# Patient Record
Sex: Female | Born: 1950 | Race: White | Hispanic: No | Smoking: Former smoker
Health system: Southern US, Community
[De-identification: ages and names within clinical notes are randomized; demographics above are authoritative.]

## PROBLEM LIST (undated history)

## (undated) DIAGNOSIS — E039 Hypothyroidism, unspecified: Secondary | ICD-10-CM

## (undated) DIAGNOSIS — N189 Chronic kidney disease, unspecified: Secondary | ICD-10-CM

## (undated) DIAGNOSIS — E079 Disorder of thyroid, unspecified: Secondary | ICD-10-CM

## (undated) DIAGNOSIS — N281 Cyst of kidney, acquired: Secondary | ICD-10-CM

## (undated) DIAGNOSIS — L299 Pruritus, unspecified: Secondary | ICD-10-CM

## (undated) DIAGNOSIS — F32A Depression, unspecified: Secondary | ICD-10-CM

## (undated) DIAGNOSIS — G4730 Sleep apnea, unspecified: Secondary | ICD-10-CM

## (undated) DIAGNOSIS — E785 Hyperlipidemia, unspecified: Secondary | ICD-10-CM

## (undated) DIAGNOSIS — R011 Cardiac murmur, unspecified: Secondary | ICD-10-CM

## (undated) DIAGNOSIS — B1920 Unspecified viral hepatitis C without hepatic coma: Secondary | ICD-10-CM

## (undated) DIAGNOSIS — F319 Bipolar disorder, unspecified: Secondary | ICD-10-CM

## (undated) DIAGNOSIS — M4800 Spinal stenosis, site unspecified: Secondary | ICD-10-CM

## (undated) DIAGNOSIS — K219 Gastro-esophageal reflux disease without esophagitis: Secondary | ICD-10-CM

## (undated) DIAGNOSIS — I10 Essential (primary) hypertension: Secondary | ICD-10-CM

## (undated) DIAGNOSIS — C801 Malignant (primary) neoplasm, unspecified: Secondary | ICD-10-CM

## (undated) DIAGNOSIS — F329 Major depressive disorder, single episode, unspecified: Secondary | ICD-10-CM

## (undated) DIAGNOSIS — R17 Unspecified jaundice: Secondary | ICD-10-CM

## (undated) DIAGNOSIS — F419 Anxiety disorder, unspecified: Secondary | ICD-10-CM

## (undated) DIAGNOSIS — M1990 Unspecified osteoarthritis, unspecified site: Secondary | ICD-10-CM

## (undated) HISTORY — PX: ABDOMINAL HYSTERECTOMY: SHX81

## (undated) HISTORY — DX: Cyst of kidney, acquired: N28.1

## (undated) HISTORY — PX: KNEE ARTHROSCOPY: SUR90

## (undated) HISTORY — DX: Unspecified jaundice: R17

## (undated) HISTORY — DX: Major depressive disorder, single episode, unspecified: F32.9

## (undated) HISTORY — DX: Depression, unspecified: F32.A

## (undated) HISTORY — DX: Chronic kidney disease, unspecified: N18.9

## (undated) HISTORY — DX: Unspecified osteoarthritis, unspecified site: M19.90

## (undated) HISTORY — PX: PARTIAL HYSTERECTOMY: SHX80

## (undated) HISTORY — DX: Pruritus, unspecified: L29.9

## (undated) HISTORY — DX: Malignant (primary) neoplasm, unspecified: C80.1

## (undated) HISTORY — DX: Disorder of thyroid, unspecified: E07.9

## (undated) HISTORY — DX: Unspecified viral hepatitis C without hepatic coma: B19.20

## (undated) HISTORY — PX: TUBAL LIGATION: SHX77

## (undated) HISTORY — DX: Spinal stenosis, site unspecified: M48.00

## (undated) HISTORY — PX: APPENDECTOMY: SHX54

## (undated) HISTORY — PX: COLONOSCOPY W/ POLYPECTOMY: SHX1380

## (undated) HISTORY — PX: JOINT REPLACEMENT: SHX530

## (undated) HISTORY — DX: Cardiac murmur, unspecified: R01.1

---

## 1999-03-22 ENCOUNTER — Ambulatory Visit (HOSPITAL_BASED_OUTPATIENT_CLINIC_OR_DEPARTMENT_OTHER): Admission: RE | Admit: 1999-03-22 | Discharge: 1999-03-22 | Payer: Self-pay | Admitting: Orthopedic Surgery

## 2002-06-10 ENCOUNTER — Inpatient Hospital Stay (HOSPITAL_COMMUNITY): Admission: EM | Admit: 2002-06-10 | Discharge: 2002-06-14 | Payer: Self-pay | Admitting: Psychiatry

## 2002-06-17 ENCOUNTER — Other Ambulatory Visit (HOSPITAL_COMMUNITY): Admission: RE | Admit: 2002-06-17 | Discharge: 2002-06-25 | Payer: Self-pay | Admitting: *Deleted

## 2002-08-14 ENCOUNTER — Inpatient Hospital Stay (HOSPITAL_COMMUNITY): Admission: EM | Admit: 2002-08-14 | Discharge: 2002-08-18 | Payer: Self-pay | Admitting: Psychiatry

## 2002-08-19 ENCOUNTER — Other Ambulatory Visit (HOSPITAL_COMMUNITY): Admission: RE | Admit: 2002-08-19 | Discharge: 2002-09-02 | Payer: Self-pay | Admitting: Psychiatry

## 2004-03-18 ENCOUNTER — Ambulatory Visit (HOSPITAL_COMMUNITY): Admission: RE | Admit: 2004-03-18 | Discharge: 2004-03-18 | Payer: Self-pay | Admitting: Internal Medicine

## 2004-03-22 ENCOUNTER — Encounter: Admission: RE | Admit: 2004-03-22 | Discharge: 2004-03-22 | Payer: Self-pay | Admitting: Internal Medicine

## 2004-04-19 ENCOUNTER — Encounter: Admission: RE | Admit: 2004-04-19 | Discharge: 2004-04-19 | Payer: Self-pay | Admitting: Internal Medicine

## 2004-07-19 ENCOUNTER — Ambulatory Visit: Payer: Self-pay | Admitting: Internal Medicine

## 2004-07-19 ENCOUNTER — Ambulatory Visit (HOSPITAL_COMMUNITY): Admission: RE | Admit: 2004-07-19 | Discharge: 2004-07-19 | Payer: Self-pay | Admitting: Internal Medicine

## 2005-02-23 ENCOUNTER — Ambulatory Visit: Payer: Self-pay | Admitting: Internal Medicine

## 2005-02-23 ENCOUNTER — Ambulatory Visit: Payer: Self-pay | Admitting: *Deleted

## 2005-03-03 ENCOUNTER — Ambulatory Visit (HOSPITAL_COMMUNITY): Admission: RE | Admit: 2005-03-03 | Discharge: 2005-03-03 | Payer: Self-pay | Admitting: Family Medicine

## 2005-08-12 ENCOUNTER — Ambulatory Visit: Payer: Self-pay | Admitting: Internal Medicine

## 2005-08-23 ENCOUNTER — Ambulatory Visit: Payer: Self-pay | Admitting: Internal Medicine

## 2006-03-03 ENCOUNTER — Ambulatory Visit: Payer: Self-pay | Admitting: Family Medicine

## 2006-03-06 ENCOUNTER — Ambulatory Visit (HOSPITAL_COMMUNITY): Admission: RE | Admit: 2006-03-06 | Discharge: 2006-03-06 | Payer: Self-pay | Admitting: Family Medicine

## 2006-04-24 ENCOUNTER — Ambulatory Visit: Payer: Self-pay | Admitting: Family Medicine

## 2006-05-30 ENCOUNTER — Ambulatory Visit: Payer: Self-pay | Admitting: Family Medicine

## 2006-06-12 ENCOUNTER — Ambulatory Visit: Payer: Self-pay | Admitting: Family Medicine

## 2006-07-25 ENCOUNTER — Encounter (INDEPENDENT_AMBULATORY_CARE_PROVIDER_SITE_OTHER): Payer: Self-pay | Admitting: Family Medicine

## 2006-07-25 ENCOUNTER — Ambulatory Visit: Payer: Self-pay | Admitting: Family Medicine

## 2006-08-07 ENCOUNTER — Inpatient Hospital Stay (HOSPITAL_COMMUNITY): Admission: RE | Admit: 2006-08-07 | Discharge: 2006-08-10 | Payer: Self-pay | Admitting: Orthopedic Surgery

## 2006-10-27 ENCOUNTER — Ambulatory Visit: Payer: Self-pay | Admitting: Family Medicine

## 2006-11-20 ENCOUNTER — Inpatient Hospital Stay (HOSPITAL_COMMUNITY): Admission: RE | Admit: 2006-11-20 | Discharge: 2006-11-22 | Payer: Self-pay | Admitting: Orthopedic Surgery

## 2006-12-12 ENCOUNTER — Ambulatory Visit: Payer: Self-pay | Admitting: Family Medicine

## 2008-01-14 ENCOUNTER — Encounter: Admission: RE | Admit: 2008-01-14 | Discharge: 2008-01-14 | Payer: Self-pay | Admitting: Internal Medicine

## 2008-03-03 ENCOUNTER — Encounter: Admission: RE | Admit: 2008-03-03 | Discharge: 2008-03-03 | Payer: Self-pay | Admitting: Neurology

## 2008-03-06 ENCOUNTER — Encounter: Admission: RE | Admit: 2008-03-06 | Discharge: 2008-05-29 | Payer: Self-pay | Admitting: Psychology

## 2009-09-28 ENCOUNTER — Inpatient Hospital Stay (HOSPITAL_COMMUNITY): Admission: RE | Admit: 2009-09-28 | Discharge: 2009-10-05 | Payer: Self-pay | Admitting: Psychiatry

## 2009-09-28 ENCOUNTER — Ambulatory Visit: Payer: Self-pay | Admitting: Psychiatry

## 2009-09-28 ENCOUNTER — Emergency Department (HOSPITAL_COMMUNITY): Admission: EM | Admit: 2009-09-28 | Discharge: 2009-09-28 | Payer: Self-pay | Admitting: Emergency Medicine

## 2010-11-21 LAB — URINALYSIS, ROUTINE W REFLEX MICROSCOPIC
Bilirubin Urine: NEGATIVE
Glucose, UA: NEGATIVE mg/dL
Hgb urine dipstick: NEGATIVE
Ketones, ur: NEGATIVE mg/dL
Nitrite: NEGATIVE
Protein, ur: NEGATIVE mg/dL
Specific Gravity, Urine: 1.009 (ref 1.005–1.030)
Urobilinogen, UA: 0.2 mg/dL (ref 0.0–1.0)
pH: 6.5 (ref 5.0–8.0)

## 2010-11-21 LAB — TSH: TSH: 2.304 u[IU]/mL (ref 0.350–4.500)

## 2010-11-21 LAB — LITHIUM LEVEL: Lithium Lvl: 0.43 mEq/L — ABNORMAL LOW (ref 0.80–1.40)

## 2010-11-22 LAB — RAPID URINE DRUG SCREEN, HOSP PERFORMED
Amphetamines: NOT DETECTED
Barbiturates: NOT DETECTED
Benzodiazepines: POSITIVE — AB
Cocaine: NOT DETECTED
Opiates: NOT DETECTED
Tetrahydrocannabinol: NOT DETECTED

## 2010-11-22 LAB — CBC
HCT: 37 % (ref 36.0–46.0)
Hemoglobin: 12.4 g/dL (ref 12.0–15.0)
MCHC: 33.7 g/dL (ref 30.0–36.0)
MCV: 92.3 fL (ref 78.0–100.0)
Platelets: 226 10*3/uL (ref 150–400)
RBC: 4.01 MIL/uL (ref 3.87–5.11)
RDW: 12.8 % (ref 11.5–15.5)
WBC: 6.8 10*3/uL (ref 4.0–10.5)

## 2010-11-22 LAB — BASIC METABOLIC PANEL
BUN: 15 mg/dL (ref 6–23)
CO2: 28 mEq/L (ref 19–32)
Calcium: 8.7 mg/dL (ref 8.4–10.5)
Chloride: 106 mEq/L (ref 96–112)
Creatinine, Ser: 0.97 mg/dL (ref 0.4–1.2)
GFR calc Af Amer: >60 mL/min (ref >60)
GFR calc non Af Amer: 59 mL/min — ABNORMAL LOW (ref >60)
Glucose, Bld: 97 mg/dL (ref 70–99)
Potassium: 4.7 mEq/L (ref 3.5–5.1)
Sodium: 140 mEq/L (ref 135–145)

## 2010-11-22 LAB — ETHANOL: Alcohol, Ethyl (B): 5 mg/dL (ref 0–10)

## 2010-12-10 ENCOUNTER — Emergency Department (HOSPITAL_COMMUNITY)
Admission: EM | Admit: 2010-12-10 | Discharge: 2010-12-11 | Disposition: A | Discharge status: Other Acute Inpatient Hospital | Payer: Medicare Other | Attending: Emergency Medicine | Admitting: Emergency Medicine

## 2010-12-10 DIAGNOSIS — Z79899 Other long term (current) drug therapy: Secondary | ICD-10-CM | POA: Insufficient documentation

## 2010-12-10 DIAGNOSIS — F329 Major depressive disorder, single episode, unspecified: Secondary | ICD-10-CM | POA: Insufficient documentation

## 2010-12-10 DIAGNOSIS — F3289 Other specified depressive episodes: Secondary | ICD-10-CM | POA: Insufficient documentation

## 2010-12-10 DIAGNOSIS — T50904A Poisoning by unspecified drugs, medicaments and biological substances, undetermined, initial encounter: Secondary | ICD-10-CM | POA: Insufficient documentation

## 2010-12-10 DIAGNOSIS — T50901A Poisoning by unspecified drugs, medicaments and biological substances, accidental (unintentional), initial encounter: Secondary | ICD-10-CM | POA: Insufficient documentation

## 2010-12-10 DIAGNOSIS — I10 Essential (primary) hypertension: Secondary | ICD-10-CM | POA: Insufficient documentation

## 2010-12-10 LAB — DIFFERENTIAL
Basophils Absolute: 0 10*3/uL (ref 0.0–0.1)
Eosinophils Absolute: 0.1 10*3/uL (ref 0.0–0.7)
Lymphocytes Relative: 28 % (ref 12–46)
Lymphs Abs: 2.6 10*3/uL (ref 0.7–4.0)
Monocytes Relative: 10 % (ref 3–12)
Neutro Abs: 5.7 10*3/uL (ref 1.7–7.7)
Neutrophils Relative %: 61 % (ref 43–77)

## 2010-12-10 LAB — COMPREHENSIVE METABOLIC PANEL
ALT: 29 U/L (ref 0–35)
AST: 29 U/L (ref 0–37)
Albumin: 4.3 g/dL (ref 3.5–5.2)
Alkaline Phosphatase: 68 U/L (ref 39–117)
BUN: 10 mg/dL (ref 6–23)
CO2: 27 mEq/L (ref 19–32)
Calcium: 10.2 mg/dL (ref 8.4–10.5)
Chloride: 99 mEq/L (ref 96–112)
Creatinine, Ser: 0.86 mg/dL (ref 0.4–1.2)
GFR calc non Af Amer: >60 mL/min (ref >60)
Glucose, Bld: 118 mg/dL — ABNORMAL HIGH (ref 70–99)
Potassium: 4 mEq/L (ref 3.5–5.1)
Sodium: 136 mEq/L (ref 135–145)
Total Bilirubin: 0.9 mg/dL (ref 0.3–1.2)

## 2010-12-10 LAB — CBC
HCT: 45.1 % (ref 36.0–46.0)
Hemoglobin: 14.9 g/dL (ref 12.0–15.0)
MCH: 31.1 pg (ref 26.0–34.0)
MCHC: 33 g/dL (ref 30.0–36.0)
MCV: 94.2 fL (ref 78.0–100.0)
RBC: 4.79 MIL/uL (ref 3.87–5.11)
RDW: 12.3 % (ref 11.5–15.5)
WBC: 9.2 10*3/uL (ref 4.0–10.5)

## 2010-12-10 LAB — ETHANOL: Alcohol, Ethyl (B): <5 mg/dL (ref 0–10)

## 2010-12-10 LAB — RAPID URINE DRUG SCREEN, HOSP PERFORMED
Amphetamines: POSITIVE — AB
Cocaine: NOT DETECTED
Opiates: NOT DETECTED
Tetrahydrocannabinol: NOT DETECTED

## 2010-12-11 ENCOUNTER — Inpatient Hospital Stay (HOSPITAL_COMMUNITY)
Admission: EM | Admit: 2010-12-11 | Discharge: 2010-12-14 | DRG: 885 | Disposition: A | Discharge status: Home / Self Care | Payer: 59 | Source: Intra-hospital | Attending: Psychiatry | Admitting: Psychiatry

## 2010-12-11 DIAGNOSIS — T43502A Poisoning by unspecified antipsychotics and neuroleptics, intentional self-harm, initial encounter: Secondary | ICD-10-CM

## 2010-12-11 DIAGNOSIS — T424X4A Poisoning by benzodiazepines, undetermined, initial encounter: Secondary | ICD-10-CM

## 2010-12-11 DIAGNOSIS — E785 Hyperlipidemia, unspecified: Secondary | ICD-10-CM

## 2010-12-11 DIAGNOSIS — F319 Bipolar disorder, unspecified: Secondary | ICD-10-CM

## 2010-12-11 DIAGNOSIS — I10 Essential (primary) hypertension: Secondary | ICD-10-CM

## 2010-12-14 LAB — FOLATE: Folate: 14.2 ng/mL

## 2010-12-14 LAB — VITAMIN B12: Vitamin B-12: 480 pg/mL (ref 211–911)

## 2010-12-14 LAB — TSH: TSH: 1.328 u[IU]/mL (ref 0.350–4.500)

## 2010-12-16 NOTE — H&P (Signed)
  NAME:  Becky Lawson, MENTH                  ACCOUNT NO.:  [id number]  MEDICAL RECORD NO.:  [id number]           PATIENT TYPE:  I  LOCATION:  0505                          [facility]ACILITY:  [facility]  PHYSICIAN:  Barbra Sarks, DO             DATE OF BIRTH:  DATE OF ADMISSION:  12/11/2010 DATE OF DISCHARGE:                      PSYCHIATRIC ADMISSION ASSESSMENT   This is a 60 year old female voluntarily admitted on 12/11/2010.  HISTORY OF PRESENT ILLNESS:  The patient is here after an overdose on Valium taking well 12-1/2 Valium tablets after she had a breakup in a relationship that ended suddenly. She states that she was not ready for it to end. She states her medications were working.  She states that she was having depressive symptoms and could not stop hollering and she was hoping that the pain would go away at the time of the overdose. She was afraid she did not want to be alone.  PAST PSYCHIATRIC HISTORY:  The patient has been here prior.  She sees Dr. Polo Riley. She was last here in June 2011.  SOCIAL HISTORY:  The patient lives alone.  She is on disability.  She is single.  FAMILY HISTORY:  None.  X-RAYS:  She denies any alcohol or substance use.  PRIMARY CARE PROVIDER:  Unknown.  PAST MEDICAL HISTORY:  History of hypertension and cholesterol.  MEDICATIONS: 1. Cymbalta 20 mg b.i.d. 2. Abilify 10 mg q.h.s. 3. Ritalin 10 mg 1/2 tablet tab b.i.d. 4. Lovastatin 10 mg daily 5. Naprosyn 200 mg one q.6 h p.r.n. for pain 6. Ranitidine 150 mg q.h.s.  DRUG ALLERGIES:  No known allergies.  PHYSICAL EXAMINATION:  This is a middle-aged female assessed in the emergency department.  She appears in no distress.  She offers no complaints.  Her physical exam was reviewed.  Appeared well hydrated. Her CBC is within normal limits.  CMET within normal limits.  Urine drug screen is positive for benzodiazepines, positive for amphetamines.  MENTAL STATUS EXAM:  She is fully alert and  cooperative with good eye contact, casually dressed.  Speech is clear, normal pace and tone.  Mood is depressed.  She got tearful when talking about being alone.  Thought processes are coherent, goal directed.  Denies any suicidal or homicidal thoughts, cognitive function intact.  Her memory appears intact. Judgment and insight are fair. AXIS I:  History bipolar disorder. AXIS II:  Deferred. AXIS III:  Hypertension, hyperlipidemia. AXIS IV:  Other psychosocial problems. AXIS V:  Current is 35.  PLAN:  Plan is to continue her  medications at this time.  Will address her Valium use.  Continue to assess other comorbidities and support. The patient may benefit from some individual therapy if not already in process.  Her tentative length of stay is 2-3 days.     Redgie Grayer, N.P.   ______________________________ Barbra Sarks, DO    JO/MEDQ  D:  12/14/2010  T:  12/14/2010  Job:  [id number]  Electronically Signed by Arnetha CourserP. on 12/16/2010 09:19:23 AM Electronically Signed by Barbra Sarks MD on 12/16/2010 09:31:25 PM

## 2010-12-31 NOTE — Discharge Summary (Signed)
NAME:  Becky Lawson, Becky Lawson                  ACCOUNT NO.:  [id number]  MEDICAL RECORD NO.:  [id number]           PATIENT TYPE:  I  LOCATION:  0505                          [facility]ILITY[facility]  PHYSICIAN:  Marlou Sa, MD DATE OF BIRTH:  01-04-1951  DATE OF ADMISSION:  12/11/2010 DATE OF DISCHARGE:  12/14/2010                              DISCHARGE SUMMARY   REASON FOR ADMISSION/HOSPITAL COURSE:  This is a 60 year old female who was here after an overdose of Valium taking 12, 0.5 mg Valium tablets after a breakup in a relationship that ended suddenly.  She was reporting that her medications were not working, having some issues of feeling lonely after a relationship that ended.  FINAL DIAGNOSES:  AXIS I:  History of bipolar disorder. AXIS II:  Deferred. AXIS III:  Hypertension, hyperlipidemia. AXIS IV:  Other psychosocial problems. AXIS V:  Current is 50-55.  SIGNIFICANT LABS:  CBC was within normal limits.  CMET was within normal limits.  Urine drug screen was positive for benzodiazepines and positive for amphetamines.  SIGNIFICANT FINDINGS:  This was a fully alert and cooperative female with good eye contact, casually dressed.  Speech was clear, with normal pace and tone.  She was depressed and got tearful.  Her thought processes were coherent and goal directed.  Her memory was intact.  She was admitted to the adult milieu and we had a discussion about her Valium use.  She can continue with her other medications.  She was attending groups and  was participating in aftercare planning groups. She stated when she took the Valium, that she just wanted to sleep and again endorsed the feeling of loneliness after a breakup with the relationship.  We continued with her Cymbalta and increased her Abilify to 15 mg and obtained further labs.  The patient had stated that she was ready for discharge, was feeling much better and she stated that she had a lot of tools to help her out at home.  She  denied any suicidal or homicidal thoughts or psychotic symptoms.  We provided suicide prevention.  The patient had a deterrent for harming herself or her children and grandchildren.  She stated she had no desire to die and stated that her overdose was accidental.  We felt the patient was stable for discharge.  DISCHARGE MEDICATIONS: 1. Abilify 50 mg daily. 2. Cymbalta 30 mg b.i.d. 3. Hydrochlorothiazide 25 mg one tablet daily. 4. Lamictal 200 mg b.i.d. 5. Lovastatin 20 mg daily. 6. Naprosyn 250 mg b.i.d. 7. Ranitidine 150 mg q.h.s. 8. Ritalin 10 mg one-half tablet b.i.d. 9. Trazodone 100 mg two tablets q.h.s. 10.The patient was to stop taking her Valium.  DISCHARGE INSTRUCTIONS:  Follow up with Lajuana Ripple, nurse practitioner at phone number [phone] and her therapist Mickel Crow at phone number [phone] on April 16, at 2:00 p.m.     Redgie Grayer, N.P.   ______________________________ Marlou Sa, MD    JO/MEDQ  D:  12/27/2010  T:  12/27/2010  Job:  [id number]  Electronically Signed by Arnetha CourserP. on 12/30/2010 11:57:06 AM Electronically Signed by Marlou Sa  on  12/31/2010 06:47:20 AM

## 2011-01-21 NOTE — H&P (Signed)
NAME:  Becky Lawson, Becky Lawson                            ACCOUNT NO.:  [id number]   MEDICAL RECORD NO.:  [id number]                   PATIENT TYPE:  IPS   LOCATION:  0302                                 [facility]ILITY[facility]  [facility]   PHYSICIAN:  Rulon Eisenmenger, M.D.              DATE OF BIRTH:  1951-05-22   DATE OF ADMISSION:  08/14/2002  DATE OF DISCHARGE:                         PSYCHIATRIC ADMISSION ASSESSMENT   IDENTIFYING INFORMATION:  This is a 60 year old single white female who is a  voluntary admission.   HISTORY OF PRESENT ILLNESS:  This 60 year old female reports 2 months of  poor concentration and anhedonia.  She reports that she really never  stabilized after being discharged from [facility] in October of 2003 and found that  fairly consistently over the past 2 months her work audit showed repeated  errors which are generally unlike her, this occurring over the past 4-6  weeks.  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  She had had some problems with drowsiness during the day and  she was attempting to shift her Depakote dosing to h.s. instead of  throughout the day as initially done.  However, she continues to feel  somewhat drowsy during the day, difficulty concentrating, anhedonic.  When  she gets home at night she just sits in front to the television and does not  feel like doing anything else, feels that she lacks motivation, is more  fragile and tearful.  She reports suicidal thoughts for the past 2-3 weeks  with a plan to overdose on her medications, and at one point had poured out  all the pills earlier this week and counted them out to figure out if she  had enough to overdose on.  She notes that she about a month ago developed a  new relationship with a female friend who she has moved into her home.  She  feels that this is somewhat of a destabilizing force, although she is  satisfied with the relationship, but she is not accustomed to having a  person in her home and reports that she generally is an insecure person and  finds herself more self conscious and checking herself more frequently.   PAST PSYCHIATRIC HISTORY:  The patient is followed by Dr. Cristy Friedlander and by  Dr. Pennelope Bracken, who is her psychotherapist.  This is her second admission to  [facility], with the last one being in October of  2003.  The patient has been diagnosed with bipolar disorder on her last  admission to [facility] after years of being diagnosed with depression, and she  states she has many years' history of episodes of irresponsible spending,  having mood swings that would last for several days that involved increased  energy and increased irritability and raging anger  at times.  These cycles  interspersed with episodes of deep depression, lethargy and anhedonia.   SOCIAL HISTORY:  The patient has been married and divorced 3 times in the  past.  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  She lives at home with her 5 cats and reports good relationships with her 2  grown children, a daughter and a son both of whom live in [place].  She  has no legal charges against her, no history of prior substance abuse.   FAMILY HISTORY:  Unremarkable.   ALCOHOL AND DRUG HISTORY:  The patient denies any substance abuse.   PAST MEDICAL HISTORY:  The patient has been followed by Dr. Lorenza Cambridge who is her  primary care physician.  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   MEDICATIONS:  Depakote 250 mg p.o. q.a.m., 500 mg p.o. q.h.s., Premarin  0.625 mg daily, Prozac 60 mg.  She has taken this Prozac for the past 2  years at this same steady dose.  Risperdal 0.5 mg p.o. q.h.s. and this was  started on her last admission as was the  Depakote.  Klonopin 0.5 mg p.o.  t.i.d. and this has been a consistent dose for some time.  Trazodone 50 mg  at h.s.  Dexedrine 5 mg 1/2 tablet b.i.d. was initiated at her last  admission.  She reports that she took the Dexedrine about 1 month and then  took it no longer.  She could not tell that it helped with any of her  refractory symptoms of depression and felt that she could only afford a  limited amount of medications.  The patient takes Accuretic 20 mg daily for  her blood pressure.  She also takes Darvocet tablets occasionally as a  prescription from her primary care physician for the osteoarthritis in her  right knee.  The patient was also previously discharged on Wellbutrin XL 300  mg p.o. once daily but felt that it caused acute tinnitus and she was  unwilling to consider a lower dose, and so Dr. Clovis Pu approximately 2 weeks  ago had completely stopped the Wellbutrin.  The patient reports she has been  treated with Effexor XR in the distant past but felt that she could not  tolerate it for reasons that are unclear.   DRUG ALLERGIES:  None.   POSITIVE PHYSICAL FINDINGS:  This is a well-nourished, well-developed,  obese, female with a rather tearful but somewhat labile affect.  Her vital  signs on admission:  Temperature 98.2, pulse 78, respirations 20, blood  pressure 146/79.  She is 5 feet 5 inches tall and weighed 234 pounds at the  time of admission.  Her skin is pale in tone with no remarkable lesions, no  evidence of self-mutilation scars.  HEAD:  Normocephalic and atraumatic.  Sclerae are clear.  Fundi are  unremarkable.  Hearing intact to normal voice.  Tongue is midline without  fasciculations.  Oropharynx is noninjected.  NECK:  Supple, no thyromegaly, no thyroid nodules noted.  CARDIOVASCULAR:  S1 and S2 heard, no clicks, murmurs or gallops.  Apical  pulse is synchronous with radial pulse.  No JVD.  No evidence of peripheral  edema. LUNGS:  Clear to auscultation.   The patient is a nonsmoker.  GENITALIA:  Deferred.  MUSCULOSKELETAL:  No swelling or deformity noted of any joints.  Posture is  upright.  Gait is grossly normal.  NEURO:  Cranial nerves II-XII intact.  EOMs intact with no nystagmus.  Motor  movements are smooth.  Eye tracking is smooth and within normal limits.  Sensory is grossly intact.  Could not elicit deep tendon reflexes  bilaterally.  Romberg without findings.  No focal findings.   DIAGNOSTIC STUDIES:  Hemoglobin 12.8, hematocrit 36.8, MCV is 92.4,  platelets 259,000.  Electrolytes were normal.  Glucose was very mildly  elevated at 118 and this was a random specimen late in the afternoon.  Liver  enzymes are within normal limits.  Her albumin was mildly decreased at 3.2  on a normal scale of 3.5 to 5.2.  TSH was normal at 2.681.  The patient's  valporic acid level was 54.4, on the low side of therapeutic range.   MENTAL STATUS EXAM:  This is an obese female with a bright affect.  She is  quite friendly, outgoing, speaks readily, with good eye contact, then  reveals a somewhat labile affect, when asked to talk about her concerns  pretty quickly bursts into tears, although she is able to maintain control.  She is fully alert.  She is cooperative and directable.  Speech is normal  without pressure.  Mood is depressed but somewhat labile, seems somewhat  hopeless about her ability to get over depression and states that her  feelings of wanting to just not be here anymore are chronic and states she  feels like this every day.  Thought process is positive for suicidal  ideation with a plan to overdose.  No evidence of overt paranoia.  She is  logical, goal directed.  No evidence of homicidal ideation.  Cognitively she  is intact and oriented x3.  Intelligence is above average.  Insight fair.  Impulse control and judgment within normal limits.   ADMISSION DIAGNOSIS:   AXIS I:  Bipolar I disorder, mixed state versus depressed.    AXIS II:  Deferred.   AXIS III:  Hypertension, hepatitis C, postmenopausal state.   AXIS IV:  Moderate, relationship stress with occupational problems, having  some poor work performance which she attributes to her mood, and some  resulting economic problems.   AXIS V:  Current 28, past year 91.   INITIAL PLAN OF CARE:  Voluntarily admit the patient with q.15 minute checks  in place.  We still have some additional routine labs pending, including  urinalysis and urine drug screen.  We will add Risperdal 0.25 mg q.6 hours  p.r.n. should she have feelings of agitation which she feels do come over  her periodically.  She is allowed to have Ativan 0.5 mg q.4 hours p.r.n.  also for anxiety.  We are going to see how she sleeps tonight in response to  a p.r.n. dose of Risperdal.  We may consider adding an additional  antipsychotic or possibly switching her to Geodon.  At this point we are observing her for her level of agitation and how well she sleeps.   ESTIMATED LENGTH OF STAY:  Five days.      Margaret A. Laurita Quint                   Rulon Eisenmenger, M.D.    MAS/MEDQ  D:  08/16/2002  T:  08/17/2002  Job:  [id number]

## 2011-01-21 NOTE — H&P (Signed)
NAME:  Becky Lawson, Becky Lawson                  ACCOUNT NO.:  [id number]   MEDICAL RECORD NO.:  [id number]          PATIENT TYPE:  INP   LOCATION:  NA                           FACILITY:  [facility]   PHYSICIAN:  John L. Rendall, M.D.  DATE OF BIRTH:  18-Dec-1950   DATE OF ADMISSION:  DATE OF DISCHARGE:                              HISTORY & PHYSICAL   CHIEF COMPLAINT:  Left knee pain for the last 7 years.   HISTORY OF PRESENT ILLNESS:  This 60 year old white female patient  presented to Dr. Ronnie Derby with a history of a right knee replacement done  by him on August 07, 2006.  That knee has done well, and she now has  been having problems with the left knee.  Left knee pain has been  present for about 7 years.  It came on suddenly and then has been  getting progressively worse.  She has no known injury but a history of a  left knee arthroscopy in July of 2000.   At this point, the left knee pain is an intermittent, dull throbbing  sensation over the anterior knee without radiation.  It increases with  exercise activity and bad weather and then decreases with rest and  elevation.  She had been taking Flexeril and Vicodin for pain with  minimal relief.  The knee does pop, catch, grind, give way, and keep her  up at night.  She has received cortisone injections in the past with  minimal relief.  She does not ambulate with any assistive devices.   ALLERGIES:  No known drug allergies.   CURRENT MEDICATIONS:  1. Lisinopril/hydrochlorothiazide 20/12.5 mg, 2 tablets p.o. q.a.m.  2. Prozac 40 mg 1 tablet p.o. q.a.m.  3. Lamictal 100 mg 2 tablets p.o. q.a.m. and q.p.m.  4. Trazodone 100 mg 2 tablets p.o. q.h.s.  5. Protonix 40 mg 1 tablet p.o. q.a.m.  6. Clonazepam 0.5 mg, half a tablet p.o. b.i.d..  7. Vicodin 5/500 mg 1-2 tablets p.o. q.4 h p.r.n. for pain.  8. GNC multivitamin 1 tablet p.o. q.a.m.  9. Flexeril 10 mg 1 tablet p.o. t.i.d. p.r.n. spasms.   PAST MEDICAL HISTORY:  1. Hypertension.  2.  Sleep apnea which is improved.  She does not use a CPAP.  3. Gastroesophageal reflux disease.  4. Migraines.  Says she has only had one in the past 6 months.  5. Bipolar disease, controlled with meds.  6. Hepatitis C positive, in remission.   PAST SURGICAL HISTORY:  1. Bilateral tubal ligation 1974.  2. Partial hysterectomy 1976.  3. Bilateral knee arthroscopies July 2000 by Dr. Mayer Camel.  4. Right total knee arthroplasty by Dr. Estill Bamberg.  Lucey August 07, 2006.   She denies any complications from the above-mentioned procedures.   SOCIAL HISTORY:  She does have a 20 pack-year history of cigarette  smoking, which she quit 1968.  She does not drink any alcohol nor use  any drugs.  She is divorced and lives by herself in a one-story house.  She has two children.  Her medical  doctor is Dr. 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   FAMILY HISTORY:  Mother died at the age of 34 with heart disease,  hypertension, diabetes and stroke.  Father is alive at age 75 with  hypertension, TIA and Parkinson's.  She has no siblings.  Her son is 29,  daughter is 66 and they are both alive and well.   REVIEW OF SYSTEMS:  She does wear glasses and has had the normal change  of vision with that.  She does have chronic tinnitus.  She is Hepatitis  C positive, but that is in remission and just has liver problems with  that.  Nothing recent.  She does have a history of headaches and  migraines.  All other systems are negative and noncontributory.   PHYSICAL EXAM:  GENERAL:  Well-developed, well-nourished white female in  no acute distress.  Talks easily with the examiner.  Mood and affect are  appropriate.  Walks with a slight limp.  Height 5 feet 5 inches, weight  230 pounds, BMI is 37-1/2.  VITAL SIGNS:  Temperature 99.2, pulse 104, respirations 16 and BP  128/66.  HEENT:  Normocephalic, atraumatic without frontal or maxillary sinus  tenderness to palpation.  Conjunctiva pink.  Sclerae  anicteric.  PERLA.  EOMs intact.  No visible external ear deformities.  Hearing grossly  intact.  Tympanic membranes pearly gray bilaterally with good light  reflex.  Nose and nasal septum midline.  Nasal mucosa pink and moist  without exudates or polyps noted.  Buccal mucosa pink and moist.  Dentition in good repair.  Pharynx without erythema or exudates.  Tongue  and uvula midline.  Tongue without fasciculations, and uvula rises  equally with phonation.  NECK:  No visible masses or lesions noted.  Trachea midline.  No  palpable lymphadenopathy, nor thyromegaly.  Carotids +2 bilaterally.  Full range of motion nontender to palpation along the cervical spine.  CARDIOVASCULAR:  Heart rate and rhythm regular.  S1-S2 present without  rubs, clicks or murmurs noted.  RESPIRATORY:  Respirations even and unlabored.  Breath sounds clear to  auscultation bilaterally without rales or wheezes noted.  ABDOMEN:  Rounded abdominal contour.  Bowel sounds present x4 quadrants.  Soft, nontender to palpation without hepatosplenomegaly nor CVA  tenderness.  Nontender palpation about the vertebral column.  Femoral  pulses +2 bilaterally.  BREAST/GU/RECTAL/PELVIC:  These exams deferred at this time.  MUSCULOSKELETAL:  No obvious deformities bilateral upper extremities,  with full range of motion of these extremities without pain.  Radial  pulses +2 bilaterally.  She has full range of motion of her hips, ankles  and toes bilaterally.  DP and PT pulses are +2.  No calf pain with  palpation.  Negative Homans' sign bilaterally.  Right knee has a well-healed midline incision.  She is lacking about 5  degrees of full extension and has flexion to 112 degrees without  crepitus or pain.  She is stable to varus and valgus stress.  Negative  anterior drawer.  No effusion.  No pain with palpation along the joint line.  Left knee:  There is a bruise on the lateral aspect of the knee,  but she has full extension of the  knee and flexion to 108 degrees with  moderate crepitus.  There is pain with palpation over the medial and  lateral joint line.  No effusion.  Stable to varus and valgus stress.  Negative anterior drawer.  NEUROLOGIC::  Alert and oriented x3.  Cranial  nerves II-XII are grossly  intact.  Strength 05/05 bilateral upper and lower extremities.  Rapid  alternating movements intact.  Deep tendon reflexes 2+ bilateral upper  and lower extremities.   RADIOLOGIC FINDINGS:  X-rays taken of both knees in July 2002 show end-  stage osteoarthritis on the right at that time, and bone-on-bone contact  in the medial compartment with significant patellofemoral changes.  Left  knee at that point had a moderate arthritic changes.   IMPRESSION:  1. End-stage osteoarthritis left knee, status post right knee      replacement.  2. Hypertension.  3. Sleep apnea without use of a CPAP.  4. Bipolar disorder.  5. Gastroesophageal reflux disease.  6. Migraines.  7. Hepatitis C positive, now in remission.   PLAN:  Becky Lawson will be admitted to [facility] on November 20, 2006, where she will undergo a left total knee arthroplasty by Dr.  Estill Bamberg. Lucey.  She will undergo all the routine preoperative  laboratory tests and studies prior to this procedure.  If we have any  medical issues while she is hospitalized, we will contact Dr. Leward Quan  or the teaching service for medical management.      Vonita Moss Duffy, P.A.      John L. Rendall, M.D.  Electronically Signed    KED/MEDQ  D:  11/17/2006  T:  11/18/2006  Job:  [id number]

## 2011-01-21 NOTE — Op Note (Signed)
NAME:  Becky Lawson, Becky Lawson                  ACCOUNT NO.:  [id number]   MEDICAL RECORD NO.:  [id number]          PATIENT TYPE:  INP   LOCATION:  5030                         FACILITY:  [facility]   PHYSICIAN:  Estill Bamberg. Ronnie Derby, M.D. DATE OF BIRTH:  07/09/1951   DATE OF PROCEDURE:  11/20/2006  DATE OF DISCHARGE:                               OPERATIVE REPORT   SURGEON:  Estill Bamberg. Ronnie Derby, M.D.   ASSISTANT:  Benita Stabile, P.A.   ANESTHESIA:  General.   PREOPERATIVE DIAGNOSIS:  Left knee osteoarthritis.   POSTOPERATIVE DIAGNOSIS:  Left knee osteoarthritis.   PROCEDURE:  Left total knee arthroplasty.   INDICATIONS FOR PROCEDURE:  The patient is a 60 year old white female  who has failed conservative measures for osteoarthritis of the knee.  Informed consent was obtained.   DESCRIPTION OF PROCEDURE:  The patient was laid supine and administered  general anesthesia.  A Foley catheter was placed.  The left leg was  prepped and draped in the usual sterile fashion.  After sterile prep and  drape, the extremity was exsanguinated with the Esmarch and elevated to  350 mmHg and sat for an hour.  Midline incision was made with #10 blade.  A fresh blade was used to make a median parapatellar arthrotomy and  perform a synovectomy.  The deep MCL was elevated off the medial crest  of the tibia, and a mild varus release performed on entry to the joint.  I then used the extramedullary alignment system on the tibia, made a  perpendicular cut to the anatomic axis of the tibia -- removing 2 mm of  bone off the medial tibial articular surface (which was the more worn  side).  Then used the intramedullary alignment system on the femur, set  on 60-degree valgus cut, pinned the distal femoral cutting block into  place and made the cut with a sagittal saw.  I marked out the  epicondylar axis; the posterior condylar angle measured 3 degrees.  I  measured to a size F; pinned to the 3 degree external rotation holes.  I  then fastened the 401 cutting block to the distal femur and made the  anterior and posterior chamfer cuts with a sagittal saw.  I then removed  the 401 cutting block.  I then placed a lamina spreader into the knee;  removed the medial and lateral menisci, posterior condylar osteophytes  and the ACL and PCL.  I then placed a 10-mm spacer block in the knee,  and had good flexion and extension gap balance.  I then finished the  femur with a size F finishing block; finished the tibia with a size 4  tibial tray drilling keel.  I then trialed with the components, and the  10 spacer worked best.  I finished the patella using a 32-mm template,  drilling 3 lug holes after using a 32-mm reamer.  I then copiously  irrigated and mixed up cement.  I cemented in the components and F  femur, 4 tibia, 10 insert, 32 patella.  Once the cement was hardened I  let the tourniquet  down.  I then closed the arthrotomy with #1 Vicryl  sutures; the deep soft tissue with 0 Vicryl sutures, the subcuticular  with 3-0 Vicryl stitch and skin with staples.   COMPLICATIONS:  None.   DRAINS:  Hemovac one pain catheter   ESTIMATED BLOOD LOSS:  3 mL.   TOURNIQUET TIME:  62 minutes.           ______________________________  Estill Bamberg Ronnie Derby, M.D.     SDL/MEDQ  D:  11/20/2006  T:  11/20/2006  Job:  [id number]

## 2011-01-21 NOTE — Discharge Summary (Signed)
NAME:  Becky Lawson, Becky Lawson                  ACCOUNT NO.:  [id number]   MEDICAL RECORD NO.:  [id number]          PATIENT TYPE:  INP   LOCATION:  5034                         FACILITY:  [facility]   PHYSICIAN:  Estill Bamberg. Ronnie Derby, M.D. DATE OF BIRTH:  [birth year]   DATE OF ADMISSION:  08/07/2006  DATE OF DISCHARGE:  08/10/2006                               DISCHARGE SUMMARY   ADMISSION DIAGNOSES:  1. End-stage osteoarthritis, right knee.  2. Hypertension.  3. Sleep apnea without use of continuous positive airway machine.  4. Bipolar disease.  5. Gastroesophageal reflux disease.  6. Migraines.   DISCHARGE DIAGNOSES:  1. Status post right total knee arthroplasty.  2. Acute blood loss anemia secondary to surgery, asymptomatic.  3. Hyperkalemia, resolved.  4. Sleep apnea on CPAP.  5. Mild hyponatremia.  No symptoms.  6. Periodic tachycardia secondary to poor pain control.  7. Hypertension.  8. GERD.  9. Migraines.   HISTORY OF PRESENT ILLNESS:  Mrs. Mcclendon is a 60 year old white female  with right knee pain for approximately 5-1/2 years.  No known injury.  History of a knee scope in 2000.  At the time of admission right knee  was pain was described as an achy, throbbing pain with radiation to both  hip and foot.  The patient used a cane to ambulate.  Mechanical symptoms  were positive for catching, giving way, locking, painful popping.  She  had waking pain and failed conservative treatment.  Therefore, was  admitted and underwent a right total knee arthroplasty.   ALLERGIES:  NO KNOWN DRUG ALLERGIES.   HOME MEDICATIONS:  1. Lisinopril HCTZ 20/12.5, two tabs daily q.a.m.  2. Premarin 0.625 one tab q.a.m.  3. Prozac 40 mg one tab daily.  4. Lamictal 30 mg one daily.  5. Trazodone 100 mg one daily q.h.s.  6. Protonix 40 mg one daily q.a.m.  7. Darvocet-N 100 1-2 tablets p.r.n. pain.  8. Tramadol 50 mg one tab p.r.n. knee pain.  9. Clonazepam 0.5 mg one tab b.i.d.  10.Aspirin 81 mg since  July 12, 2006.  11.Metronidazole 500 mg, last dose August 01, 2006 for bladder      infection.   SURGICAL PROCEDURE:  The patient was taken to the operating room on  August 07, 2006 by Dr. Marchelle Gearing.  He was assisted by Mike Craze.  Petrarca, P.A.-C.  The patient underwent a right total knee arthroplasty  using the following components:  A size 5 tibial component, a size F  right femoral component, all poly patella size 32, 8.5 mm thickness and  a 10 mm bearing.  The patient tolerated the procedure well and returned  to recovery in good and stable condition.   CONSULTANTS:  The following consults were obtained while the patient was  hospitalized:  PT, OT, case management.   HOSPITAL COURSE:  On postop day one the patient afebrile.  Vital signs  stable.  H&H was stable at 10.6 and 30.0.  White count was slightly  elevated at 11,300.  Sodium 135, potassium was 4.0.  The patient without  chest pain,  shortness of breath, calf pain.  On postop day one the pain  was a 3-4/10.  CPAP was started due to the patient's sleep apnea.   On postop day two the patient denied any chest pain, shortness of  breath.  Having heartburn, hiccups.  H&H is 10.5 and 30.5.  Potassium  was 5.4.  The patient complained of somewhat poor pain control.  Her IV  was Hep-Locked and IV had been running with 20 mEq of potassium and this  was felt to be the source of her hyperkalemia.   On postop day three the patient with no chest pain, shortness of breath,  nausea, vomiting, calf pain, dizziness.  Positive bowel movement.  Heartburn resolved with Protonix.  The patient's H&H 9.2 and 26.4.  The  patient denied any dizziness or signs of anemia.  Pulse ranged from 85  to 119.  Currently at the time of discharge heart rate was 88 beats per  minute.  The patient's pain under good control with Dilaudid which she  was placed on during the night due to poor pain control.  Potassium was  within normal limits at 4.3.   Sodium was slightly low at 132.   The patient was placed on iron due to her acute blood loss anemia.  Again, the patient was asymptomatic in regards to her anemia.  The  patient was progressing well with physical therapy.  It was felt the  patient could be discharged to a skilled nursing facility in good and  stable condition.  The patient had worked with therapy the day of  discharge and was sit to stand with min to mod assist. The patient was  75%.  Able to ambulate 100 feet with a rolling walker with minimal  guarding in the hallway.   MEDS ON FLOOR:  1. Lovenox 30 mg subcu q.12h. for 14 days, begin on 08/08/2006.  At      the time of discharge the patient began Lovenox 40 mg once daily      for the remainder of the 14 days.  2. Colace 100 mg one p.o. b.i.d.  3. Lisinopril 20 mg one p.o. daily.  4. Hydrochlorothiazide 25 mg one p.o. daily.  5. Premarin 0.625 mg one daily.  6. Prozac 40 mg one p.o. daily.  7. Trazodone 100 mg p.o. q.h.s.  8. Lamictal 300 mg one p.o. daily.  9. Klonopin 0.5 mg one p.o. b.i.d. at 10:00 a.m. and 10:00 p.m.  10.Protonix 40 mg one p.o. b.i.d.  11.Dilaudid 2 mg 1-2 tablets q.3-4h. p.r.n. pain.  12.Senokot one tab p.o. p.r.n., none taken.  13.Tylenol 325-650 mg p.o. q.4h. p.r.n.  14.Robaxin 500 mg p.o. q.6h. p.r.n. spasm.  15.Iron 325 mg one p.o. t.i.d. for 28 days, began on August 10, 2006.   DISCHARGE INSTRUCTIONS:  1. Meds:  Floor meds will be adjusted by skilled nursing facility      physician.  2. Diet:  The patient is regular diet, advance as tolerated.  3. Weightbearing:  The patient is weightbearing as tolerated on the      right leg.  4. Wound care:  The patient is to keep wound clean and dry.  Change      dressing daily.  May shower after 2 days if no drainage from the      wound site.   FOLLOW UP:  The patient needs to follow up with Dr. Ronnie Derby 14 days status post right total knee.  At that time staples will be  removed and x-rays   obtained.  Please call office at [phone] for appointment.   SPECIAL INSTRUCTIONS:  CPM 0 to 90 degrees 6-8 hours a day, increase by  10 degrees daily.   When the patient is in bed towel roll needs to be placed underneath the  ankle to gain full extension.   CONDITION:  The patient was discharged to skilled nursing facility in  good and stable condition.      Erskine Emery, P.A.    ______________________________  Estill Bamberg. Ronnie Derby, M.D.    GC/MEDQ  D:  08/10/2006  T:  08/10/2006  Job:  [id number]   cc:   Estill Bamberg. Ronnie Derby, M.D.

## 2011-12-12 ENCOUNTER — Emergency Department (HOSPITAL_COMMUNITY)
Admission: EM | Admit: 2011-12-12 | Discharge: 2011-12-12 | Disposition: A | Discharge status: Home / Self Care | Payer: Medicare Other | Attending: Emergency Medicine | Admitting: Emergency Medicine

## 2011-12-12 ENCOUNTER — Emergency Department (HOSPITAL_COMMUNITY): Payer: Medicare Other

## 2011-12-12 ENCOUNTER — Encounter (HOSPITAL_COMMUNITY): Payer: Self-pay | Admitting: Emergency Medicine

## 2011-12-12 DIAGNOSIS — S0990XA Unspecified injury of head, initial encounter: Secondary | ICD-10-CM | POA: Insufficient documentation

## 2011-12-12 DIAGNOSIS — F319 Bipolar disorder, unspecified: Secondary | ICD-10-CM | POA: Insufficient documentation

## 2011-12-12 DIAGNOSIS — S0291XA Unspecified fracture of skull, initial encounter for closed fracture: Secondary | ICD-10-CM

## 2011-12-12 DIAGNOSIS — W1809XA Striking against other object with subsequent fall, initial encounter: Secondary | ICD-10-CM | POA: Insufficient documentation

## 2011-12-12 DIAGNOSIS — S02109A Fracture of base of skull, unspecified side, initial encounter for closed fracture: Secondary | ICD-10-CM | POA: Insufficient documentation

## 2011-12-12 DIAGNOSIS — I10 Essential (primary) hypertension: Secondary | ICD-10-CM | POA: Insufficient documentation

## 2011-12-12 HISTORY — DX: Anxiety disorder, unspecified: F41.9

## 2011-12-12 HISTORY — DX: Bipolar disorder, unspecified: F31.9

## 2011-12-12 HISTORY — DX: Essential (primary) hypertension: I10

## 2011-12-12 HISTORY — DX: Hyperlipidemia, unspecified: E78.5

## 2011-12-12 LAB — CBC
HCT: 40.6 % (ref 36.0–46.0)
MCV: 93.3 fL (ref 78.0–100.0)
RDW: 12.6 % (ref 11.5–15.5)
WBC: 9.7 10*3/uL (ref 4.0–10.5)

## 2011-12-12 LAB — DIFFERENTIAL
Basophils Absolute: 0 10*3/uL (ref 0.0–0.1)
Eosinophils Relative: 0 % (ref 0–5)
Lymphocytes Relative: 18 % (ref 12–46)
Monocytes Absolute: 0.8 10*3/uL (ref 0.1–1.0)

## 2011-12-12 LAB — BASIC METABOLIC PANEL
CO2: 28 mEq/L (ref 19–32)
Calcium: 9.1 mg/dL (ref 8.4–10.5)
Creatinine, Ser: 0.8 mg/dL (ref 0.50–1.10)
Glucose, Bld: 98 mg/dL (ref 70–99)

## 2011-12-12 MED ORDER — ONDANSETRON HCL 8 MG PO TABS: 8.0000 mg | ORAL_TABLET | ORAL | Status: AC | PRN

## 2011-12-12 MED ORDER — MORPHINE SULFATE 4 MG/ML IJ SOLN
4.0000 mg | Freq: Once | INTRAMUSCULAR | Status: AC
  Administered 2011-12-12: 4 mg via INTRAVENOUS
  Filled 2011-12-12: qty 1

## 2011-12-12 MED ORDER — SODIUM CHLORIDE 0.9 % IV SOLN: INTRAVENOUS | Status: DC

## 2011-12-12 MED ORDER — ONDANSETRON 8 MG PO TBDP
8.0000 mg | ORAL_TABLET | Freq: Once | ORAL | Status: AC
  Administered 2011-12-12: 8 mg via ORAL

## 2011-12-12 MED ORDER — SODIUM CHLORIDE 0.9 % IV BOLUS (SEPSIS)
500.0000 mL | Freq: Once | INTRAVENOUS | Status: AC
  Administered 2011-12-12: 500 mL via INTRAVENOUS

## 2011-12-12 MED ORDER — ONDANSETRON HCL 4 MG/2ML IJ SOLN
4.0000 mg | Freq: Once | INTRAMUSCULAR | Status: AC
  Administered 2011-12-12: 4 mg via INTRAVENOUS
  Filled 2011-12-12: qty 2

## 2011-12-12 MED ORDER — OXYCODONE-ACETAMINOPHEN 5-325 MG PO TABS: 2.0000 | ORAL_TABLET | ORAL | Status: AC | PRN

## 2011-12-12 MED ORDER — ONDANSETRON 8 MG PO TBDP
ORAL_TABLET | ORAL | Status: AC
  Filled 2011-12-12: qty 1

## 2011-12-12 NOTE — ED Notes (Addendum)
Per EMS, Pt walking outside, lost her balance and fell backwards hitting head on curb. Pt drove self home, became dizzy and nauseous so she called EMS.  Pt denies loss of consciousness at any time.  Pt with hematoma and dried blood to back of head. Denies taking any anticoagulants.

## 2011-12-12 NOTE — ED Notes (Signed)
Pt c/o increased nausea, Dr. Lacinda Axon made aware, orders received.

## 2011-12-12 NOTE — Discharge Instructions (Signed)
Skull Fracture, Uncomplicated, Adult You have a skull fracture. This happens when one of the bones of your head cracks or breaks. Your injury does not appear serious at this time and we feel you can be observed safely at home. An injury to the head that causes a skull fracture may also cause a concussion. With a concussion you may be knocked out for a brief moment (loss of consciousness). A concussion is the mildest form of traumatic brain injury. The symptoms of a concussion are short-lived and resolve on their own. The most common symptoms are confusion and forgetting what happened (amnesia). SYMPTOMS These minor symptoms may be experienced after discharge:  Memory difficulties.   Dizziness.   Headaches.   Hearing difficulties.   Vertigo or trouble with balance.   Depression.   Tiredness.   Difficulty with concentration.   Nausea.   Vomiting.  A bruise on the brain (concussion) requires a few days for recovery the same as a bruise elsewhere on your body. It is common for people with injuries such as yours to experience such problems. Usually these problems disappear without medical care. If symptoms remain for more than a few days, notify your caregiver. See your caregiver sooner if symptoms are becoming worse rather than better. HOME CARE INSTRUCTIONS   During the next 24 hours you should stay with an adult who can watch you for the above warning signs.   This person should wake you up every 1 hour to check on your condition, noting any of the above signs or symptoms. Problems which are getting worse mean you should call or return immediately to the facility where you were just seen, or to the nearest emergency department. In case of emergency or unconsciousness, call for local emergency medical help.   You should take clear liquids for the rest of the day and then resume your regular diet.   You should not take sedatives or alcoholic beverages for 48 hours after discharge.    After injuries such as yours, most serious problems happen within the first 24 hours.   If x-rays or other testing were done, make sure you know how you are to get those results. It is your responsibility to call back for results when your caregiver suggests. Do not assume everything is fine if your caregiver has not been able to reach you.   Skull fractures usually do not need follow up x-rays. These fractures are not like broken arms. The position of the skull stays the same as when it was broken and usually heals without problems.   If you have a concussion be aware that symptoms may last up to a week or longer.   It is very important to keep any follow-up appointments after a head injury.   It is unlikely that serious side effects will occur. If they do occur it is usually soon after the accident but may occur as long as a week after the accident or injury.  SEEK IMMEDIATE MEDICAL CARE IF:   Confusion or drowsiness develops; children frequently become drowsy after any type of trauma or injury.   A person cannot arouse the injured person.   You feel sick to your stomach (nausea) or persistent, forceful vomiting (projectile in nature).   Rapid back and forth movement of the eyes. This is called nystagmus.   Convulsions, seizures, or unconsciousness.   Severe persistent headaches not relieved by medication. Only take over-the-counter or prescription medicines for pain, discomfort, or fever as directed by  your caregiver. (Do not take aspirin as this weakens blood clotting abilities).   Inability to use arms or legs appropriately.   Changes in pupil sizes.   Clear or bloody discharge from nose or ears.  Document Released: 06/22/2004 Document Revised: 08/11/2011 Document Reviewed: 10/28/2008 [facility] Patient Information 2012 Media.  Medications for pain and nausea. Rest. Do not recommend any excessive exertion.  Followup your primary care Dr.

## 2011-12-12 NOTE — ED Provider Notes (Addendum)
History     CSN: [id number]  Arrival date & time 12/12/11  Becky Lawson   First MD Initiated Contact with Patient 12/12/11 2021      Chief Complaint  Patient presents with  . Fall  . Head Injury    (Consider location/radiation/quality/duration/timing/severity/associated sxs/prior treatment) HPI.... accidental fall off from the curb striking her occipital area this afternoon. No frank neuro deficits the patient became lightheaded and nauseated. No loss of consciousness. No arm or leg weakness. Certain positions make it worse. Pain is moderate. No radiation of pain  Past Medical History  Diagnosis Date  . Hypertension   . Anxiety   . Bipolar 1 disorder   . Hyperlipidemia     Past Surgical History  Procedure Date  . Joint replacement     knee  . Partial hysterectomy     History reviewed. No pertinent family history.  History  Substance Use Topics  . Smoking status: Never Smoker   . Smokeless tobacco: Not on file  . Alcohol Use: No    OB History    Grav Para Term Preterm Abortions TAB SAB Ect Mult Living                  Review of Systems  All other systems reviewed and are negative.    Allergies  Review of patient's allergies indicates no known allergies.  Home Medications   Current Outpatient Rx  Name Route Sig Dispense Refill  . BUPROPION HCL ER (XL) 150 MG PO TB24 Oral Take 150 mg by mouth every morning.    [name] HYDROCHLOROTHIAZIDE 25 MG PO TABS Oral Take 25 mg by mouth daily.    [name] LAMOTRIGINE 200 MG PO TABS Oral Take 200 mg by mouth 2 (two) times daily.    [name] LOVASTATIN 40 MG PO TABS Oral Take 40 mg by mouth at bedtime.    . METHYLPHENIDATE HCL 10 MG PO TABS Oral Take 20 mg by mouth every morning.    [name] RANITIDINE HCL 150 MG PO CAPS Oral Take 300 mg by mouth every evening.    . TRAZODONE HCL 300 MG PO TABS Oral Take 300 mg by mouth at bedtime.      BP 140/66  Pulse 85  Temp(Src) 98.3 F (36.8 C) (Oral)  Resp 20  Ht 5\' 4"  (1.626 m)  Wt 222 lb (100.699 kg)   BMI 38.11 kg/m2  SpO2 94%  Physical Exam  Nursing note and vitals reviewed. Constitutional: She is oriented to person, place, and time. She appears well-developed and well-nourished.  HENT:  Head: Normocephalic.       Very tender over occipital area  Eyes: Conjunctivae and EOM are normal. Pupils are equal, round, and reactive to light.  Neck: Normal range of motion. Neck supple.       No neck tenderness  Cardiovascular: Normal rate and regular rhythm.   Pulmonary/Chest: Effort normal and breath sounds normal.  Abdominal: Soft. Bowel sounds are normal.  Musculoskeletal: Normal range of motion.  Neurological: She is alert and oriented to person, place, and time.  Skin: Skin is warm and dry.  Psychiatric: She has a normal mood and affect.    ED Course  Procedures (including critical care time)   Labs Reviewed  CBC  DIFFERENTIAL  BASIC METABOLIC PANEL   Ct Head Wo Contrast  12/12/2011  *RADIOLOGY REPORT*  Clinical Data: Fall hitting back of head.  CT HEAD WITHOUT CONTRAST  Technique:  Contiguous axial images were obtained from the base of  the skull through the vertex without contrast.  Comparison: 01/14/2008 CT.  03/03/2008 MR.  Findings: Large left scalp hematoma of posterior-superior scalp region with a nondisplaced long segment left paracentral parietal - occipital fracture.  Questionable tiny hemorrhagic contusion in the left frontal lobe (series 2 image 10).  Attention to this on follow-up.  No CT evidence of large acute infarct.  No hydrocephalus.  No intracranial mass lesion.  The  IMPRESSION: Large left scalp hematoma of posterior-superior scalp region with a nondisplaced long segment left paracentral parietal - occipital fracture.  Questionable tiny hemorrhagic contusion in the left frontal lobe (series 2 image 10).  Attention to this on follow-up.  Original Report Authenticated By: Doug Sou, M.D.     No diagnosis found.    MDM  Clinical scenario discussed with  neurosurgeon on call. Recommend discharge home with close follow up with primary care. Patient is exhibiting no neuro deficits. Patient alert at discharge       Nat Christen, MD 12/12/11 2217  Nat Christen, MD 12/12/11 2251

## 2011-12-28 ENCOUNTER — Ambulatory Visit (INDEPENDENT_AMBULATORY_CARE_PROVIDER_SITE_OTHER): Payer: Medicare Other | Admitting: Family Medicine

## 2011-12-28 VITALS — BP 163/83 | HR 90 | Temp 98.4°F | Resp 20 | Ht 64.0 in | Wt 216.6 lb

## 2011-12-28 DIAGNOSIS — R21 Rash and other nonspecific skin eruption: Secondary | ICD-10-CM

## 2011-12-28 DIAGNOSIS — R82998 Other abnormal findings in urine: Secondary | ICD-10-CM

## 2011-12-28 DIAGNOSIS — R110 Nausea: Secondary | ICD-10-CM

## 2011-12-28 DIAGNOSIS — R8281 Pyuria: Secondary | ICD-10-CM

## 2011-12-28 LAB — POCT URINALYSIS DIPSTICK
Bilirubin, UA: NEGATIVE
Blood, UA: NEGATIVE
Glucose, UA: NEGATIVE
Ketones, UA: NEGATIVE
Nitrite, UA: NEGATIVE
Protein, UA: NEGATIVE
Spec Grav, UA: 1.02
Urobilinogen, UA: 1
pH, UA: 6.5

## 2011-12-28 LAB — POCT CBC
Granulocyte percent: 58.4 %G (ref 37–80)
HCT, POC: 45.3 % (ref 37.7–47.9)
Hemoglobin: 14.7 g/dL (ref 12.2–16.2)
Lymph, poc: 2.7 (ref 0.6–3.4)
MCH, POC: 31.1 pg (ref 27–31.2)
MCHC: 32.5 g/dL (ref 31.8–35.4)
MCV: 95.9 fL (ref 80–97)
MID (cbc): 0.6 (ref 0–0.9)
MPV: 8.8 fL (ref 0–99.8)
POC Granulocyte: 4.7 (ref 2–6.9)
POC LYMPH PERCENT: 33.7 %L (ref 10–50)
POC MID %: 7.9 %M (ref 0–12)
Platelet Count, POC: 307 10*3/uL (ref 142–424)
RBC: 4.72 M/uL (ref 4.04–5.48)
RDW, POC: 13.5 %
WBC: 8.1 10*3/uL (ref 4.6–10.2)

## 2011-12-28 LAB — POCT UA - MICROSCOPIC ONLY
Bacteria, U Microscopic: NEGATIVE
Casts, Ur, LPF, POC: NEGATIVE
Crystals, Ur, HPF, POC: NEGATIVE
Mucus, UA: NEGATIVE
Yeast, UA: NEGATIVE

## 2011-12-28 MED ORDER — ONDANSETRON 8 MG PO TBDP: 8.0000 mg | ORAL_TABLET | Freq: Three times a day (TID) | ORAL | Status: AC | PRN

## 2011-12-28 MED ORDER — DOXYCYCLINE HYCLATE 100 MG PO TABS: 100.0000 mg | ORAL_TABLET | Freq: Two times a day (BID) | ORAL | Status: AC

## 2011-12-28 NOTE — Patient Instructions (Signed)
Nausea, Adult Nausea is the feeling that you have an upset stomach or have to vomit. Nausea by itself is not likely a serious concern, but it may be an early sign of more serious medical problems. As nausea gets worse, it can lead to vomiting. If vomiting develops, there is the risk of dehydration.  CAUSES   Viral infections.   Food poisoning.   Medicines.   Pregnancy.   Motion sickness.   Migraine headaches.   Emotional distress.   Severe pain from any source.   Alcohol intoxication.  HOME CARE INSTRUCTIONS  Get plenty of rest.   Ask your caregiver about specific rehydration instructions.   Eat small amounts of food and sip liquids more often.   Take all medicines as told by your caregiver.  SEEK MEDICAL CARE IF:  You have not improved after 2 days, or you get worse.   You have a headache.  SEEK IMMEDIATE MEDICAL CARE IF:   You have a fever.   You faint.   You keep vomiting or have blood in your vomit.   You are extremely weak or dehydrated.   You have dark or bloody stools.   You have severe chest or abdominal pain.  MAKE SURE YOU:  Understand these instructions.   Will watch your condition.   Will get help right away if you are not doing well or get worse.  Document Released: 09/29/2004 Document Revised: 08/11/2011 Document Reviewed: 05/04/2011 [facility] Patient Information 2012 [place], [place].

## 2011-12-28 NOTE — Progress Notes (Signed)
61 yo woman with one month of progressive nausea, no vomiting.   Associated with bloating Normal throat, swallowing, urine passage.  No diarrhea. Noted tick on left flank 2 days ago with rash now. No fever. Associated:  Dizziness.  PMHx:  S/P hysterectomy  Recent skull fx with negative CT 2 weeks ago, but nausea precedes the fall  O: NAD HEENT:  Unremarkable.  No icterus Chest:  Clear Heart:  Reg, no murmur Abdomen: soft, nontender but gets nauseated with eating. Skin:  1 cm rash left flank  12/12/2011 *RADIOLOGY REPORT* Clinical Data: Fall hitting back of head. CT HEAD WITHOUT CONTRAST Technique: Contiguous axial images were obtained from the base of the skull through the vertex without contrast. Comparison: 01/14/2008 CT. 03/03/2008 MR. Findings: Large left scalp hematoma of posterior-superior scalp region with a nondisplaced long segment left paracentral parietal - occipital fracture. Questionable tiny hemorrhagic contusion in the left frontal lobe (series 2 image 10). Attention to this on follow-up. No CT evidence of large acute infarct. No hydrocephalus. No intracranial mass lesion. The IMPRESSION: Large left scalp hematoma of posterior-superior scalp region with a nondisplaced long segment left paracentral parietal - occipital fracture. Questionable tiny hemorrhagic contusion in the left frontal lobe (series 2 image 10). Attention to this on follow-up. Original Report Authenticated By: Doug Sou, M.D.  Results for orders placed in visit on 12/28/11  POCT CBC      Component Value Range   WBC 8.1  4.6 - 10.2 (K/uL)   Lymph, poc 2.7  0.6 - 3.4    POC LYMPH PERCENT 33.7  10 - 50 (%L)   MID (cbc) 0.6  0 - 0.9    POC MID % 7.9  0 - 12 (%M)   POC Granulocyte 4.7  2 - 6.9    Granulocyte percent 58.4  37 - 80 (%G)   RBC 4.72  4.04 - 5.48 (M/uL)   Hemoglobin 14.7  12.2 - 16.2 (g/dL)   HCT, POC 45.3  37.7 - 47.9 (%)   MCV 95.9  80 - 97 (fL)   MCH, POC 31.1  27 - 31.2 (pg)   MCHC 32.5   31.8 - 35.4 (g/dL)   RDW, POC 13.5     Platelet Count, POC 307  142 - 424 (K/uL)   MPV 8.8  0 - 99.8 (fL)  POCT UA - MICROSCOPIC ONLY      Component Value Range   WBC, Ur, HPF, POC 0-1     RBC, urine, microscopic 0-1     Bacteria, U Microscopic neg     Mucus, UA neg     Epithelial cells, urine per micros 0-2     Crystals, Ur, HPF, POC neg     Casts, Ur, LPF, POC neg     Yeast, UA neg    POCT URINALYSIS DIPSTICK      Component Value Range   Color, UA yellow     Clarity, UA clear     Glucose, UA neg     Bilirubin, UA neg     Ketones, UA neg     Spec Grav, UA 1.020     Blood, UA neg     pH, UA 6.5     Protein, UA neg     Urobilinogen, UA 1.0     Nitrite, UA neg     Leukocytes, UA small (1+)      A:  Unexplained nausea with rash from tick.  Losing 6 # as well.  P:  Check labs Start treatment for RMSF with doxy, zofran

## 2011-12-30 LAB — ROCKY MTN SPOTTED FVR AB, IGM-BLOOD: ROCKY MTN SPOTTED FEVER, IGM: 0.1 IV

## 2011-12-30 LAB — COMPREHENSIVE METABOLIC PANEL
ALT: 49 U/L — ABNORMAL HIGH (ref 0–35)
AST: 62 U/L — ABNORMAL HIGH (ref 0–37)
Albumin: 4.5 g/dL (ref 3.5–5.2)
Alkaline Phosphatase: 68 U/L (ref 39–117)
BUN: 15 mg/dL (ref 6–23)
CO2: 27 mEq/L (ref 19–32)
Calcium: 9.7 mg/dL (ref 8.4–10.5)
Chloride: 100 mEq/L (ref 96–112)
Creat: 0.91 mg/dL (ref 0.50–1.10)
Glucose, Bld: 101 mg/dL — ABNORMAL HIGH (ref 70–99)
Potassium: 4.1 mEq/L (ref 3.5–5.3)
Sodium: 140 mEq/L (ref 135–145)
Total Bilirubin: 0.7 mg/dL (ref 0.3–1.2)
Total Protein: 6.6 g/dL (ref 6.0–8.3)

## 2011-12-30 LAB — SEDIMENTATION RATE: Sed Rate: 1 mm/hr (ref 0–22)

## 2011-12-30 LAB — B. BURGDORFI ANTIBODIES: B burgdorferi Ab IgG+IgM: 0.15 {ISR}

## 2011-12-31 ENCOUNTER — Telehealth: Payer: Self-pay

## 2011-12-31 MED ORDER — ONDANSETRON HCL 4 MG PO TABS: 4.0000 mg | ORAL_TABLET | Freq: Three times a day (TID) | ORAL | Status: DC | PRN

## 2011-12-31 NOTE — Telephone Encounter (Signed)
Done and sent in.  Becky Lawson

## 2011-12-31 NOTE — Telephone Encounter (Signed)
LMOM notifying patient.

## 2011-12-31 NOTE — Telephone Encounter (Signed)
DR L GAVE HER ZOPHRAN FOR NAUSEA AND IT MADE HER WORSE BECAUSE IT HAS TO DISSOLVE ON HER TONGUE.  WANTS TO KNOW IF WE CAN CALL IN JUST A PILL FOR HER TO SWALLOW. USES THE [facility] ON [address]

## 2012-01-02 ENCOUNTER — Other Ambulatory Visit: Payer: Medicare Other

## 2012-01-02 LAB — URINE CULTURE: Colony Count: >=100000

## 2012-01-04 ENCOUNTER — Telehealth: Payer: Self-pay

## 2012-01-04 NOTE — Telephone Encounter (Signed)
Pt called pharmacy and asked if it was okay for her to take  2 zofran instead of 1every 8hrs, she would like to know if Dr Fay Records thinks this is okay, please call pt on her cell [phone].

## 2012-01-04 NOTE — Telephone Encounter (Signed)
Take the Zofran every 4 hours.  If this doesn't work, we'll use something else

## 2012-01-04 NOTE — Telephone Encounter (Signed)
IS IT OK FOR HER TO TAKE 2 ZOFRAN EVERY 8 HOURS AS OPPOSED TO 1 EVER 8 HOURS? PLEASE ADVISE.

## 2012-01-05 ENCOUNTER — Ambulatory Visit
Admission: RE | Admit: 2012-01-05 | Discharge: 2012-01-05 | Disposition: A | Discharge status: Home / Self Care | Payer: Medicare Other | Source: Ambulatory Visit | Attending: Family Medicine | Admitting: Family Medicine

## 2012-01-05 DIAGNOSIS — R110 Nausea: Secondary | ICD-10-CM

## 2012-01-05 NOTE — Telephone Encounter (Signed)
Pt CB and was given instr's from Dr L. Pt agreed and will CB if this still does not help.

## 2012-01-05 NOTE — Telephone Encounter (Signed)
LMOM at both numbers for pt to [facility]

## 2012-01-16 ENCOUNTER — Other Ambulatory Visit: Payer: Self-pay | Admitting: Family Medicine

## 2012-01-17 ENCOUNTER — Telehealth: Payer: Self-pay

## 2012-01-17 NOTE — Telephone Encounter (Signed)
PT STATES SHE HAVE FAXED OVER A REQUEST 2 TIMES TO HAVE HER RECORDS SENT TO HER PHYSC AND STILL THEY HAVE NOT RECEIVED THEM, THE OTHER DR HAD ALSO REQUESTED [place] [phone]

## 2012-01-17 NOTE — Telephone Encounter (Signed)
Faxed patient's Labs from 12/28/11 and 06/13/11 to Lajuana Ripple at fax # [phone].  Pt gave me verbal permission to fax these Labs to [employer].  Pt also stated that another doctor's office has requested records, but I told the patient that I had not seen a release with her name on it.  Pt stated that she will call the other office and get them to refax it to [place].

## 2012-02-01 ENCOUNTER — Other Ambulatory Visit: Payer: Self-pay | Admitting: Physician Assistant

## 2012-02-14 ENCOUNTER — Ambulatory Visit (INDEPENDENT_AMBULATORY_CARE_PROVIDER_SITE_OTHER): Payer: Medicare Other | Admitting: Family Medicine

## 2012-02-14 VITALS — BP 104/69 | HR 82 | Temp 98.5°F | Resp 18 | Ht 64.5 in | Wt 209.4 lb

## 2012-02-14 DIAGNOSIS — F419 Anxiety disorder, unspecified: Secondary | ICD-10-CM

## 2012-02-14 DIAGNOSIS — R279 Unspecified lack of coordination: Secondary | ICD-10-CM

## 2012-02-14 DIAGNOSIS — R42 Dizziness and giddiness: Secondary | ICD-10-CM

## 2012-02-14 DIAGNOSIS — R270 Ataxia, unspecified: Secondary | ICD-10-CM

## 2012-02-14 DIAGNOSIS — H532 Diplopia: Secondary | ICD-10-CM

## 2012-02-14 MED ORDER — ALPRAZOLAM 0.5 MG PO TABS: 0.5000 mg | ORAL_TABLET | Freq: Every evening | ORAL | Status: AC | PRN

## 2012-02-14 NOTE — Progress Notes (Signed)
61 yo woman with one month of progressive nausea, no vomiting.  Associated with bloating  Normal throat, swallowing, urine passage. No diarrhea.  Noted tick on left flank 2 days ago with rash now.  No fever.  Associated: Dizziness. Mild diplopia mostly in the morning. PMHx: S/P hysterectomy  Recent skull fx with negative CT 2 weeks ago, but nausea precedes the fall  Objective NAD   12/12/2011 *RADIOLOGY REPORT* Clinical Data: Fall hitting back of head. CT HEAD WITHOUT CONTRAST Technique: Contiguous axial images were obtained from the base of the skull through the vertex without contrast. Comparison: 01/14/2008 CT. 03/03/2008 MR. Findings: Large left scalp hematoma of posterior-superior scalp region with a nondisplaced long segment left paracentral parietal - occipital fracture. Questionable tiny hemorrhagic contusion in the left frontal lobe (series 2 image 10). Attention to this on follow-up. No CT evidence of large acute infarct. No hydrocephalus. No intracranial mass lesion. The IMPRESSION: Large left scalp hematoma of posterior-superior scalp region with a nondisplaced long segment left paracentral parietal - occipital fracture. Questionable tiny hemorrhagic contusion in the left frontal lobe (series 2 image 10).   Today's exam: Patient has coarse tremor with arms outstretched, worse on the left. She has mild dysmetria with finger to nose testing. She is unable to stand on one leg without falling. 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  Reflexes: Normal AJ and KJ Motor: Good strength bilaterally HEENT: Unremarkable with normal fundi and Cranial nerves III through XII: Intact.  Assessment: I'm concerned with the ongoing tremor, vertigo (worse in the morning). The absence of headache is a bit reassuring but the cerebellar signs are definitely there now. It's now been over 2 months since her fall this is becoming worrisome.  Plan: Proceed with MR  of brain today Arrange neurology consultation on a more urgent basis

## 2012-02-18 ENCOUNTER — Other Ambulatory Visit: Payer: Medicare Other

## 2012-02-20 ENCOUNTER — Other Ambulatory Visit: Payer: Self-pay | Admitting: Physician Assistant

## 2012-02-26 ENCOUNTER — Ambulatory Visit
Admission: RE | Admit: 2012-02-26 | Discharge: 2012-02-26 | Disposition: A | Discharge status: Home / Self Care | Payer: Medicare Other | Source: Ambulatory Visit | Attending: Family Medicine | Admitting: Family Medicine

## 2012-02-26 DIAGNOSIS — H532 Diplopia: Secondary | ICD-10-CM

## 2012-02-26 DIAGNOSIS — R42 Dizziness and giddiness: Secondary | ICD-10-CM

## 2012-02-26 DIAGNOSIS — R270 Ataxia, unspecified: Secondary | ICD-10-CM

## 2012-02-28 ENCOUNTER — Telehealth: Payer: Self-pay

## 2012-02-28 NOTE — Telephone Encounter (Signed)
Pt CB and LM on RN VM stating she has ?s about her MRI. I called pt back and pt just wanted to make sure that everything was normal bc she is still having the "spells" of dizziness and other Sxs. Pt stated that she has already gotten a call from the neurologist and has not set up appt yet. She was waiting for MRI results and might wait a month to see if she conts to have problems. Encouraged pt to follow through w/ appt if Sxs persist.

## 2012-03-02 ENCOUNTER — Other Ambulatory Visit: Payer: Self-pay | Admitting: Physician Assistant

## 2012-03-09 ENCOUNTER — Ambulatory Visit (INDEPENDENT_AMBULATORY_CARE_PROVIDER_SITE_OTHER): Payer: Medicare Other | Admitting: Family Medicine

## 2012-03-09 VITALS — BP 146/80 | HR 74 | Temp 99.0°F | Resp 18 | Ht 64.5 in | Wt 202.0 lb

## 2012-03-09 DIAGNOSIS — R069 Unspecified abnormalities of breathing: Secondary | ICD-10-CM

## 2012-03-09 DIAGNOSIS — H8109 Meniere's disease, unspecified ear: Secondary | ICD-10-CM

## 2012-03-09 MED ORDER — PROMETHAZINE HCL 12.5 MG PO TABS: 12.5000 mg | ORAL_TABLET | Freq: Three times a day (TID) | ORAL | Status: DC | PRN

## 2012-03-09 NOTE — Patient Instructions (Signed)
Meniere's Disease You have Meniere's disease. Meniere's disease is a term for the recurrent symptoms (problems) of vertigo (the room or you seem to spin around), tinnitus (ringing in the ears), and hearing loss. The cause is unknown. These episodes often come on suddenly and without warning. They are sometimes associated with nausea (feeling sick to your stomach) and vomiting. There is no cure. A number of strategies may help the symptoms. Surgical help is available if conservative measures fail. HOME CARE INSTRUCTIONS   If you smoke, STOP.   Restrict your salt intake.   Stop caffeine consumption (caffeinated coffee, sodas, and chocolate).   Do not drive during or near the time of attacks.   Over the counter Antivert (meclizine) at a 25 mg dosage 3 times per day may be helpful.  SEEK IMMEDIATE MEDICAL CARE IF:   Nausea and vomiting are continuous.   You have no relief from vertigo.  MAKE SURE YOU:   Understand these instructions.   Will watch your condition.   Will get help right away if you are not doing well or get worse.  Document Released: 08/19/2000 Document Revised: 08/11/2011 Document Reviewed: 08/22/2005 [facility] Patient Information 2012 [place].

## 2012-03-09 NOTE — Progress Notes (Signed)
Sx:  61 with tinnitus and hearing loss.  During episodes of dizziness, she also experiences diplopia.  Symptoms began over 8 weeks ago.  She is losing weight secondary to the nausea which is worse each morning.  O:  NAD HEENT:  Unremarkable Neuro: intact CN III-XII  *RADIOLOGY REPORT*  Clinical Data: Head injury 12/12/2011. Dizziness, nausea. Vertigo  and ataxia.  MRI HEAD WITHOUT CONTRAST  Technique: Multiplanar, multiecho pulse sequences of the brain and  surrounding structures were obtained according to standard protocol  without intravenous contrast.  Comparison: CT head 12/12/2011  Findings: The prior head CT revealed a left occipital skull  fracture. There was question of a small hemorrhagic contusion in  the left frontal lobe on the CT.  On today's study, no evidence of intracranial hemorrhage.  Susceptibility weighting imaging reveals no evidence of hemorrhage  in the left frontal lobe, as questioned on the CT. No subdural  fluid collection is identified.  Ventricle size is normal. Negative for acute infarct. Small white  matter hyperintensities appear chronic and may be related to  chronic microvascular ischemia. No hemorrhage or mass is  identified. Paranasal sinuses are clear.  IMPRESSION:  No acute abnormality. No evidence of intracranial hemorrhage or  mass. Minimal chronic white matter changes are present, likely  related to microvascular ischemia.  Original Report Authenticated By: Truett Perna, M.D.   A:  Meniere's disease

## 2012-03-20 ENCOUNTER — Other Ambulatory Visit: Payer: Self-pay | Admitting: Physician Assistant

## 2012-04-25 ENCOUNTER — Other Ambulatory Visit: Payer: Self-pay | Admitting: Family Medicine

## 2012-05-10 ENCOUNTER — Ambulatory Visit (INDEPENDENT_AMBULATORY_CARE_PROVIDER_SITE_OTHER): Payer: Medicare Other | Admitting: Internal Medicine

## 2012-05-10 VITALS — BP 142/72 | HR 91 | Temp 98.8°F | Resp 16 | Ht 64.5 in | Wt 196.0 lb

## 2012-05-10 DIAGNOSIS — L299 Pruritus, unspecified: Secondary | ICD-10-CM

## 2012-05-10 DIAGNOSIS — L738 Other specified follicular disorders: Secondary | ICD-10-CM

## 2012-05-10 MED ORDER — DOXYCYCLINE HYCLATE 100 MG PO TABS: 100.0000 mg | ORAL_TABLET | Freq: Two times a day (BID) | ORAL | Status: AC

## 2012-05-10 NOTE — Patient Instructions (Addendum)
Folliculitis  Folliculitis is an infection and inflammation of the hair follicles. Hair follicles become red and irritated. This inflammation is usually caused by bacteria. The bacteria thrive in warm, moist environments. This condition can be seen anywhere on the body.  CAUSES The most common cause of folliculitis is an infection by germs (bacteria). Fungal and viral infections can also cause the condition. Viral infections may be more common in people whose bodies are unable to fight disease well (weakened immune systems). Examples include people with:  AIDS.   An organ transplant.   Cancer.  People with depressed immune systems, diabetes, or obesity, have a greater risk of getting folliculitis than the general population. Certain chemicals, especially oils and tars, also can cause folliculitis. SYMPTOMS  An early sign of folliculitis is a small, white or yellow pus-filled, itchy lesion (pustule). These lesions appear on a red, inflamed follicle. They are usually less than 5 mm (.20 inches).   The most likely starting points are the scalp, thighs, legs, back and buttocks. Folliculitis is also frequently found in areas of repeated shaving.   When an infection of the follicle goes deeper, it becomes a boil or furuncle. A group of closely packed boils create a larger lesion (a carbuncle). These sores (lesions) tend to occur in hairy, sweaty areas of the body.  TREATMENT   A doctor who specializes in skin problems (dermatologists) treats mild cases of folliculitis with antiseptic washes.   They also use a skin application which kills germs (topical antibiotics). Tea tree oil is a good topical antiseptic as well. It can be found at a health food store. A small percentage of individuals may develop an allergy to the tea tree oil.   Mild to moderate boils respond well to warm water compresses applied three times daily.   In some cases, oral antibiotics should be taken with the skin treatment.     If lesions contain large quantities of pus or fluid, your caregiver may drain them. This allows the topical antibiotics to get to the affected areas better.   Stubborn cases of folliculitis may respond to laser hair removal. This process uses a high intensity light beam (a laser) to destroy the follicle and reduces the scarring from folliculitis. After laser hair removal, hair will no longer grow in the laser treated area.  Patients with long-lasting folliculitis need to find out where the infection is coming from. Germs can live in the nostrils of the patient. This can trigger an outbreak now and then. Sometimes the bacteria live in the nostrils of a family member. This person does not develop the disorder but they repeatedly re-expose others to the germ. To break the cycle of recurrence in the patient, the family member must also undergo treatment. PREVENTION   Individuals who are predisposed to folliculitis should be extremely careful about personal hygiene.   Application of antiseptic washes may help prevent recurrences.   A topical antibiotic cream, mupirocin (Bactroban), has been effective at reducing bacteria in the nostrils. It is applied inside the nose with your little finger. This is done twice daily for a week. Then it is repeated every 6 months.   Because follicle disorders tend to come back, patients must receive follow-up care. Your caregiver may be able to recognize a recurrence before it becomes severe.  SEEK IMMEDIATE MEDICAL CARE IF:   You develop redness, swelling, or increasing pain in the area.   You have a fever.   You are not improving with treatment  or are getting worse.   You have any other questions or concerns.  Document Released: 10/31/2001 Document Revised: 08/11/2011 Document Reviewed: 08/27/2008 [facility] Patient Information 2012 [place].

## 2012-05-10 NOTE — Progress Notes (Signed)
  Subjective:    Patient ID: Becky Lawson, female    DOB: [birth year], 61 y.o.   MRN: [id number]  HPI Very itchy rash on arms and back only. Legs are spared Cats sit on her and sleep with her   Review of Systems Much/reviewed complex meds    Objective:   Physical Exam Skin excoriations ,red papules,few pustules tiny       Assessment & Plan:  Counseled on meds Anthistamines/doxycycline/soap and water antibacterial May need to add steroids later if not well

## 2012-05-26 ENCOUNTER — Other Ambulatory Visit: Payer: Self-pay | Admitting: Physician Assistant

## 2012-06-22 ENCOUNTER — Other Ambulatory Visit: Payer: Self-pay | Admitting: Physician Assistant

## 2012-06-22 NOTE — Telephone Encounter (Signed)
Patient needs office visit for labs

## 2012-06-28 ENCOUNTER — Other Ambulatory Visit: Payer: Self-pay | Admitting: Family Medicine

## 2012-07-25 ENCOUNTER — Ambulatory Visit (INDEPENDENT_AMBULATORY_CARE_PROVIDER_SITE_OTHER): Payer: Medicare Other | Admitting: Family Medicine

## 2012-07-25 VITALS — BP 150/83 | HR 91 | Temp 98.6°F | Resp 17 | Ht 64.5 in | Wt 194.0 lb

## 2012-07-25 DIAGNOSIS — R21 Rash and other nonspecific skin eruption: Secondary | ICD-10-CM

## 2012-07-25 MED ORDER — PROMETHAZINE HCL 12.5 MG PO TABS: 12.5000 mg | ORAL_TABLET | Freq: Three times a day (TID) | ORAL | Status: DC | PRN

## 2012-07-25 MED ORDER — IVERMECTIN 3 MG PO TABS: 3.0000 mg | ORAL_TABLET | Freq: Once | ORAL | Status: DC

## 2012-07-25 MED ORDER — METHYLPREDNISOLONE ACETATE 80 MG/ML IJ SUSP
40.0000 mg | Freq: Once | INTRAMUSCULAR | Status: AC
  Administered 2012-07-25: 40 mg via INTRAMUSCULAR

## 2012-07-25 NOTE — Patient Instructions (Addendum)
Scabies Scabies are small bugs (mites) that burrow under the skin and cause red bumps and severe itching. These bugs can only be seen with a microscope. Scabies are highly contagious. They can spread easily from person to person by direct contact. They are also spread through sharing clothing or linens that have the scabies mites living in them. It is not unusual for an entire family to become infected through shared towels, clothing, or bedding.  HOME CARE INSTRUCTIONS   Your caregiver may prescribe a cream or lotion to kill the mites. If cream is prescribed, massage the cream into the entire body from the neck to the bottom of both feet. Also massage the cream into the scalp and face if your child is less than 42 year old. Avoid the eyes and mouth. Do not wash your hands after application.  Leave the cream on for 8 to 12 hours. Your child should bathe or shower after the 8 to 12 hour application period. Sometimes it is helpful to apply the cream to your child right before bedtime.  One treatment is usually effective and will eliminate approximately 95% of infestations. For severe cases, your caregiver may decide to repeat the treatment in 1 week. Everyone in your household should be treated with one application of the cream.  New rashes or burrows should not appear within 24 to 48 hours after successful treatment. However, the itching and rash may last for 2 to 4 weeks after successful treatment. Your caregiver may prescribe a medicine to help with the itching or to help the rash go away more quickly.  Scabies can live on clothing or linens for up to 3 days. All of your child's recently used clothing, towels, stuffed toys, and bed linens should be washed in hot water and then dried in a dryer for at least 20 minutes on high heat. Items that cannot be washed should be enclosed in a plastic bag for at least 3 days.  To help relieve itching, bathe your child in a cool bath or apply cool washcloths to the  affected areas.  Your child may return to school after treatment with the prescribed cream. SEEK MEDICAL CARE IF:   The itching persists longer than 4 weeks after treatment.  The rash spreads or becomes infected. Signs of infection include red blisters or yellow-tan crust. Document Released: 08/22/2005 Document Revised: 11/14/2011 Document Reviewed: 12/31/2008 [facility] Patient Information 2013 [place].

## 2012-07-25 NOTE — Progress Notes (Signed)
61 yo woman with chronic skin burning and itching diffusely.  She is awakened at night occasionally by the burning and itching with consequent excoriations.  She is taking the phenergan 2-3 per day.  Also takes Trazodone.  She is followed by Lajuana Ripple at Dr. Radonna Ricker office.  Objective:  A bit anxious  Skin:  Scattered,  excorated papules on back, left lower abdomen, and backs of hands

## 2012-07-28 ENCOUNTER — Telehealth: Payer: Self-pay

## 2012-07-28 ENCOUNTER — Other Ambulatory Visit: Payer: Self-pay | Admitting: Radiology

## 2012-07-28 DIAGNOSIS — L293 Anogenital pruritus, unspecified: Secondary | ICD-10-CM

## 2012-07-28 MED ORDER — HYDROXYZINE HCL 50 MG PO TABS: 25.0000 mg | ORAL_TABLET | Freq: Three times a day (TID) | ORAL | Status: DC | PRN

## 2012-07-28 NOTE — Telephone Encounter (Signed)
SAW DR. L - PT WAS PRESCRIBED MED FOR RASH.  HE TOLD HER TO CALL BACK IF IT DIDN'T HELP.  IT HAS NOT HELPED AT ALL AND THE ITCHING IS DRIVING HER CRAZY.  [place] ASAP [phone]

## 2012-08-24 ENCOUNTER — Ambulatory Visit (INDEPENDENT_AMBULATORY_CARE_PROVIDER_SITE_OTHER): Payer: Medicare Other | Admitting: Family Medicine

## 2012-08-24 ENCOUNTER — Encounter: Payer: Self-pay | Admitting: Family Medicine

## 2012-08-24 VITALS — BP 155/83 | HR 83 | Temp 99.0°F | Resp 16 | Ht 64.0 in | Wt 187.4 lb

## 2012-08-24 DIAGNOSIS — L280 Lichen simplex chronicus: Secondary | ICD-10-CM

## 2012-08-24 MED ORDER — CLONAZEPAM 1 MG PO TABS: 1.0000 mg | ORAL_TABLET | Freq: Two times a day (BID) | ORAL | Status: DC | PRN

## 2012-08-24 NOTE — Patient Instructions (Signed)
Neurodermatitis Neurodermatitis is a condition caused by persistent, severe itching that causes a person to continually scratch and rub their skin for a long time. This causes the skin to thicken in that area. It is most often seen on the back and sides of the neck, wrists, and ankles. A cycle of itching-scratching usually occurs. The skin can become very irritated from scratching leading to bleeding, scaling, and crusting. Neurodermatitis is usually made worse by:  Drying or irritating chemicals.  Rough clothing.  Scratching. CAUSES Underlying causes may exist, such as pre-existing eczema. However, neurodermatitis is not usually due to any specific cause.  HOME CARE INSTRUCTIONS  Limit showering to 3 times a week to prevent irritation. Washing your skin with soap and water removes natural oils. This allows small cracks to develop in the skin and the itching may start.  Avoid very hot water and harsh soaps.  Avoid clothing such as wool that irritates your skin.  Avoid scratching if possible. TREATMENT Treatment includes applying a moisturizer 5 to 6 times daily. Use this on affected areas right after showering to seal in moisture. Avoid moisturizers that are scented. These can dry your skin and make your itching worse. To help control itching, medications may be needed by prescription from a skin doctor (dermatologist). These medications include topical steroids (corticosteroids). Over-the-counter cortisone creams applied 3 to 4 times daily to affected areas can be helpful with the itching. Antibiotic medicine or oral cortisone medicine may be used for severe cases. SEEK MEDICAL CARE IF:  Your condition worsens or is not better after 3 to 4 days of treatment. Document Released: 09/29/2004 Document Revised: 11/14/2011 Document Reviewed: 10/20/2008 [facility] Patient Information 2013 [place].

## 2012-08-24 NOTE — Progress Notes (Signed)
61 yo with months of itching on arms or back.  To date, has taken Trazodone, Atarax, Phenergan, Ranitidine, Zofran, ivermectin  Getting PT for dizziness.  Objective:  Excoriated papules over both arms and trapezius area. Spares scalp and areas she cannot reach with her hands.  Anxious  Assessment:  Neurodermatitis  Plan:  1. Neurodermatitis  clonazePAM (KLONOPIN) 1 MG tablet

## 2012-08-27 ENCOUNTER — Other Ambulatory Visit: Payer: Self-pay | Admitting: Physician Assistant

## 2012-08-31 ENCOUNTER — Telehealth: Payer: Self-pay | Admitting: *Deleted

## 2012-08-31 NOTE — Telephone Encounter (Signed)
Please clarify with patient.  At the last visit, it looks like that she was switched to clonazepam.

## 2012-08-31 NOTE — Telephone Encounter (Signed)
Pharmacy requesting refill on trazodone 100mg . Last refill on 07/31/12

## 2012-09-01 NOTE — Telephone Encounter (Signed)
LMOM to CB. 

## 2012-09-02 ENCOUNTER — Other Ambulatory Visit: Payer: Self-pay | Admitting: Physician Assistant

## 2012-09-02 MED ORDER — TRAZODONE HCL 300 MG PO TABS: 200.0000 mg | ORAL_TABLET | Freq: Every day | ORAL | Status: DC

## 2012-09-02 NOTE — Telephone Encounter (Signed)
rx sent to pharmacy

## 2012-09-02 NOTE — Telephone Encounter (Signed)
Pt states she is taking trazodone to sleep,for many years, she states she takes clonazepam prn itching. Nanna Ertle

## 2012-09-02 NOTE — Telephone Encounter (Signed)
Called pt left mssg for her to call me back.

## 2012-09-02 NOTE — Telephone Encounter (Signed)
LMOM RX sent 

## 2012-09-05 DIAGNOSIS — N281 Cyst of kidney, acquired: Secondary | ICD-10-CM

## 2012-09-05 HISTORY — DX: Cyst of kidney, acquired: N28.1

## 2012-09-08 ENCOUNTER — Ambulatory Visit (INDEPENDENT_AMBULATORY_CARE_PROVIDER_SITE_OTHER): Payer: Medicare Other | Admitting: Family Medicine

## 2012-09-08 VITALS — BP 153/73 | HR 85 | Temp 98.3°F | Resp 16 | Ht 64.0 in | Wt 188.0 lb

## 2012-09-08 DIAGNOSIS — L299 Pruritus, unspecified: Secondary | ICD-10-CM

## 2012-09-08 DIAGNOSIS — R945 Abnormal results of liver function studies: Secondary | ICD-10-CM

## 2012-09-08 DIAGNOSIS — F424 Excoriation (skin-picking) disorder: Secondary | ICD-10-CM

## 2012-09-08 DIAGNOSIS — F6389 Other impulse disorders: Secondary | ICD-10-CM

## 2012-09-08 LAB — POCT CBC
MCH, POC: 31.5 pg — AB (ref 27–31.2)
MCHC: 31.4 g/dL — AB (ref 31.8–35.4)
MID (cbc): 0.6 (ref 0–0.9)
MPV: 9.6 fL (ref 0–99.8)
POC LYMPH PERCENT: 28.9 %L (ref 10–50)
POC MID %: 10.6 %M (ref 0–12)
Platelet Count, POC: 246 10*3/uL (ref 142–424)
RBC: 4.38 M/uL (ref 4.04–5.48)
RDW, POC: 12.6 %
WBC: 5.7 10*3/uL (ref 4.6–10.2)

## 2012-09-08 LAB — POCT URINALYSIS DIPSTICK
Leukocytes, UA: NEGATIVE
Nitrite, UA: NEGATIVE
Protein, UA: NEGATIVE
pH, UA: 6

## 2012-09-08 MED ORDER — HYDROXYZINE HCL 25 MG PO TABS: 50.0000 mg | ORAL_TABLET | Freq: Four times a day (QID) | ORAL | Status: DC | PRN

## 2012-09-08 NOTE — Progress Notes (Addendum)
[facility] [address], [place] [address] [phone]  Date:  09/08/2012   Name:  Becky Lawson   DOB:  03/11/1951   MRN:  [id number]  PCP:  Becky Moores, MD    Chief Complaint: Follow-up and Labwork   History of Present Illness:  Becky Lawson is a 62 y.o. very pleasant female patient who presents with the following:  She was last here in December with itching on her arms and back, which has been treated with several different medications. None of the measures tried so far have been helpful.  Most recently we tried klonopin for neurodermatitis.  She notes "extremely, extremely bad itching."  "I'm scratching constantly."   This has been going on for a few months- since at least september.  He has not yet been to see a dermatologist.    None of the medications that we have used have been helpful for her yet.  She does feel some relief with use of atarax.  The klonopin does not seem to help.    She also states that she needs to have some labs done "because I'm malnourished."  She wants to have a CMP and UA due to "dark urine."    She has used atarax at 25 mg TID but this time requests "the maximum dosage" as her symptoms are so troublesome  There is no problem list on file for this patient.   Past Medical History  Diagnosis Date  . Hypertension   . Anxiety   . Bipolar 1 disorder   . Hyperlipidemia   . Depression     Past Surgical History  Procedure Date  . Joint replacement     knee  . Partial hysterectomy     History  Substance Use Topics  . Smoking status: Former Research scientist (life sciences)  . Smokeless tobacco: Not on file  . Alcohol Use: No    Family History  Problem Relation Age of Onset  . Diabetes Mother   . Heart disease Mother   . Hypertension Mother   . Hypertension Father   . Parkinson's disease Father   . Hemachromatosis Daughter     No Known Allergies  Medication list has been reviewed and updated.  Current Outpatient  Prescriptions on File Prior to Visit  Medication Sig Dispense Refill  . buPROPion (WELLBUTRIN XL) 150 MG 24 hr tablet Take 150 mg by mouth every morning.      . clonazePAM (KLONOPIN) 1 MG tablet Take 1 tablet (1 mg total) by mouth 2 (two) times daily as needed for anxiety.  60 tablet  1  . diphenhydrAMINE (BENADRYL) 25 MG tablet Take 50 mg by mouth every 6 (six) hours as needed.      . Escitalopram Oxalate (LEXAPRO PO) Take 1 tablet by mouth daily.      . hydrochlorothiazide (HYDRODIURIL) 25 MG tablet TAKE 1 TABLET (25 MG TOTAL) BY MOUTH DAILY.  30 tablet  2  . hydrOXYzine (ATARAX/VISTARIL) 50 MG tablet TAKE 0.5 TABLETS (25 MG TOTAL) BY MOUTH 3 (THREE) TIMES DAILY AS NEEDED FOR ITCHING.  30 tablet  0  . ivermectin (STROMECTOL) 3 MG TABS Take 1 tablet (3 mg total) by mouth once.  4 tablet  0  . lamoTRIgine (LAMICTAL) 200 MG tablet Take 200 mg by mouth 2 (two) times daily.      [name] lovastatin (MEVACOR) 40 MG tablet TAKE 1 TABLET EVERY DAY  30 tablet  0  . methylphenidate (RITALIN) 10 MG  tablet Take 20 mg by mouth every morning.      . promethazine (PHENERGAN) 12.5 MG tablet Take 1 tablet (12.5 mg total) by mouth every 8 (eight) hours as needed for nausea.  90 tablet  2  . ranitidine (ZANTAC) 150 MG tablet TAKE 1 TABLET TWICE A DAY  180 tablet  1  . trazodone (DESYREL) 300 MG tablet Take 0.5 tablets (150 mg total) by mouth at bedtime.  30 tablet  1    Review of Systems:  As per HPI- otherwise negative.   Physical Examination: Filed Vitals:   09/08/12 1731  BP: 153/73  Pulse: 85  Temp: 98.3 F (36.8 C)  Resp: 16   Filed Vitals:   09/08/12 1731  Height: 5\' 4"  (1.626 m)  Weight: 188 lb (85.276 kg)   Body mass index is 32.27 kg/(m^2). Ideal Body Weight: Weight in (lb) to have BMI = 25: 145.3   GEN: WDWN, NAD, Non-toxic, A & O x 3, obese HEENT: Atraumatic, Normocephalic. Neck supple. No masses, No LAD. Bilateral TM wnl, oropharynx normal.  PEERL,EOMI.   She does have mild scleral  icterus.  Ears and Nose: No external deformity. CV: RRR, No M/G/R. No JVD. No thrill. No extra heart sounds. PULM: CTA B, no wheezes, crackles, rhonchi. No retractions. No resp. distress. No accessory muscle use. EXTR: No c/c/e NEURO Normal gait.  PSYCH: Normally interactive. Conversant. Not depressed or anxious appearing.  Calm demeanor.  Skin: she has multiple excoriated areas on accessible areas on her arms, back, trunk and buttocks.  Some of these have developed into chronically picked lesions.  No other rash or hives  Results for orders placed in visit on 09/08/12  POCT URINALYSIS DIPSTICK      Component Value Range   Color, UA orange     Clarity, UA clear     Glucose, UA neg     Bilirubin, UA large     Ketones, UA neg     Spec Grav, UA 1.025     Blood, UA neg     pH, UA 6.0     Protein, UA neg     Urobilinogen, UA 0.2     Nitrite, UA neg     Leukocytes, UA Negative    POCT CBC      Component Value Range   WBC 5.7  4.6 - 10.2 K/uL   Lymph, poc 1.6  0.6 - 3.4   POC LYMPH PERCENT 28.9  10 - 50 %L   MID (cbc) 0.6  0 - 0.9   POC MID % 10.6  0 - 12 %M   POC Granulocyte 3.4  2 - 6.9   Granulocyte percent 60.5  37 - 80 %G   RBC 4.38  4.04 - 5.48 M/uL   Hemoglobin 13.8  12.2 - 16.2 g/dL   HCT, POC 43.9  37.7 - 47.9 %   MCV 100.2 (*) 80 - 97 fL   MCH, POC 31.5 (*) 27 - 31.2 pg   MCHC 31.4 (*) 31.8 - 35.4 g/dL   RDW, POC 12.6     Platelet Count, POC 246  142 - 424 K/uL   MPV 9.6  0 - 99.8 fL    Assessment and Plan: 1. Itching  POCT urinalysis dipstick, Comprehensive metabolic panel, POCT CBC, hydrOXYzine (ATARAX/VISTARIL) 25 MG tablet  2. Picking own skin     Becky Lawson has excoriations on her skin due to itching.  She would like to try a stronger dose of atarax.  Will increase her dose to 50 mg QID as needed.  She wants to go up to 100 mg QID- let her know that I think this would be too sedating. If her CMP is normal will suspect more strongly that her symptoms have a large  psychological component.  She does have an appt to see derm next month  Meds ordered this encounter  Medications  . hydrOXYzine (ATARAX/VISTARIL) 25 MG tablet    Sig: Take 2 tablets (50 mg total) by mouth every 6 (six) hours as needed for itching.    Dispense:  40 tablet    Refill:  0   She does have slight scleral icterus- will watch closely for her CMP  Becky Geyer, MD  Received CMP results- concerning increase of bilirubin and LFTs, we need to rule- out a pancreatic or liver mass.  Spoke with gastroenterologist on call yesterday- Dr. Benson Norway- who recommended a CT abd and pelvis

## 2012-09-08 NOTE — Patient Instructions (Addendum)
Please do be sure to see the dermatologist about your itching.  In the meantime try using the atarax at a stronger dose.  You can take 2 of the pills every 6 hours, but be careful of sedation.   I will be in touch regarding your labs.

## 2012-09-09 ENCOUNTER — Telehealth: Payer: Self-pay

## 2012-09-09 LAB — COMPREHENSIVE METABOLIC PANEL
ALT: 102 U/L — ABNORMAL HIGH (ref 0–35)
BUN: 12 mg/dL (ref 6–23)
CO2: 29 mEq/L (ref 19–32)
Creat: 0.79 mg/dL (ref 0.50–1.10)
Total Bilirubin: 7.4 mg/dL — ABNORMAL HIGH (ref 0.3–1.2)

## 2012-09-09 NOTE — Telephone Encounter (Signed)
Dr. Lorelei Pont, [name] called here and from the notes in her chart it looked like you were trying to get in touch with her about her labs. Just wanted to let you know that she was trying to get in touch with you.  Thanks, Sherrie Mustache

## 2012-09-10 ENCOUNTER — Telehealth: Payer: Self-pay | Admitting: *Deleted

## 2012-09-10 ENCOUNTER — Telehealth: Payer: Self-pay | Admitting: Radiology

## 2012-09-10 ENCOUNTER — Ambulatory Visit
Admission: RE | Admit: 2012-09-10 | Discharge: 2012-09-10 | Disposition: A | Discharge status: Home / Self Care | Payer: Medicare Other | Source: Ambulatory Visit | Attending: Family Medicine | Admitting: Family Medicine

## 2012-09-10 DIAGNOSIS — R945 Abnormal results of liver function studies: Secondary | ICD-10-CM

## 2012-09-10 MED ORDER — IOHEXOL 300 MG/ML  SOLN
100.0000 mL | Freq: Once | INTRAMUSCULAR | Status: AC | PRN
  Administered 2012-09-10: 100 mL via INTRAVENOUS

## 2012-09-10 NOTE — Telephone Encounter (Signed)
Called and left message for patient regarding appointment tomorrow with Dr. Benson Norway at 1:45 p.m.

## 2012-09-10 NOTE — Telephone Encounter (Signed)
Advised Dr Lorelei Pont the scan was done today. Additional clinical information was given to [facility] and it was authorized.

## 2012-09-10 NOTE — Addendum Note (Signed)
Addended by: Lamar Blinks C on: 09/10/2012 08:52 AM   Modules accepted: Orders

## 2012-09-11 ENCOUNTER — Encounter: Payer: Self-pay | Admitting: Family Medicine

## 2012-09-15 ENCOUNTER — Other Ambulatory Visit: Payer: Self-pay | Admitting: Family Medicine

## 2012-09-28 ENCOUNTER — Telehealth: Payer: Self-pay | Admitting: *Deleted

## 2012-09-28 NOTE — Telephone Encounter (Signed)
It looks like patient was given number 30 with one refill on 09/02/2012. Please contact pharmacy to verify this.

## 2012-09-28 NOTE — Telephone Encounter (Signed)
Pharmacy requesting refill on trazodone 100mg .

## 2012-10-01 ENCOUNTER — Other Ambulatory Visit: Payer: Self-pay | Admitting: Family Medicine

## 2012-10-01 ENCOUNTER — Encounter: Payer: Self-pay | Admitting: Family Medicine

## 2012-10-01 NOTE — Telephone Encounter (Signed)
Called pharmacy, they apologize, did not see refill

## 2012-10-12 ENCOUNTER — Other Ambulatory Visit: Payer: Self-pay | Admitting: Physician Assistant

## 2012-10-29 ENCOUNTER — Other Ambulatory Visit: Payer: Self-pay | Admitting: Physician Assistant

## 2012-11-01 DIAGNOSIS — R17 Unspecified jaundice: Secondary | ICD-10-CM

## 2012-11-01 DIAGNOSIS — B182 Chronic viral hepatitis C: Secondary | ICD-10-CM | POA: Insufficient documentation

## 2012-11-01 HISTORY — DX: Unspecified jaundice: R17

## 2012-11-23 ENCOUNTER — Other Ambulatory Visit: Payer: Self-pay | Admitting: Physician Assistant

## 2012-11-23 ENCOUNTER — Other Ambulatory Visit: Payer: Self-pay | Admitting: Radiology

## 2012-11-23 DIAGNOSIS — L299 Pruritus, unspecified: Secondary | ICD-10-CM

## 2012-11-23 NOTE — Telephone Encounter (Signed)
Dr Lorelei Pont, do you want to increase quantity to #120 for pt to take TID?

## 2012-11-23 NOTE — Telephone Encounter (Signed)
Called to check on her.  She is seeing Dr. Benson Norway and has a liver bx scheduled next week. Unfortunately she continues to have a lot of itching and is using atarax 50 (2X25) BID or TID as needed.  Will refill this for her

## 2012-11-23 NOTE — Telephone Encounter (Signed)
Pharmacy called to verify sig and quantity on the hydroxizine. Becky Lawson

## 2012-11-23 NOTE — Telephone Encounter (Signed)
Pt is taking meds three times a day, requesting 120 tablets  Arbie Cookey  [phone]

## 2012-12-31 ENCOUNTER — Other Ambulatory Visit: Payer: Self-pay | Admitting: Physician Assistant

## 2013-01-11 ENCOUNTER — Encounter: Payer: Self-pay | Admitting: Family Medicine

## 2013-01-11 DIAGNOSIS — B1920 Unspecified viral hepatitis C without hepatic coma: Secondary | ICD-10-CM | POA: Insufficient documentation

## 2013-02-10 ENCOUNTER — Other Ambulatory Visit: Payer: Self-pay | Admitting: Physician Assistant

## 2013-02-13 DIAGNOSIS — L299 Pruritus, unspecified: Secondary | ICD-10-CM

## 2013-02-13 HISTORY — DX: Pruritus, unspecified: L29.9

## 2013-02-22 ENCOUNTER — Ambulatory Visit (INDEPENDENT_AMBULATORY_CARE_PROVIDER_SITE_OTHER): Payer: Medicare Other | Admitting: Family Medicine

## 2013-02-22 VITALS — BP 138/70 | HR 65 | Temp 98.1°F | Resp 16 | Ht 64.0 in | Wt 161.0 lb

## 2013-02-22 DIAGNOSIS — I10 Essential (primary) hypertension: Secondary | ICD-10-CM

## 2013-02-22 DIAGNOSIS — L299 Pruritus, unspecified: Secondary | ICD-10-CM

## 2013-02-22 DIAGNOSIS — K219 Gastro-esophageal reflux disease without esophagitis: Secondary | ICD-10-CM

## 2013-02-22 MED ORDER — OMEPRAZOLE 40 MG PO CPDR: 40.0000 mg | DELAYED_RELEASE_CAPSULE | Freq: Every day | ORAL | Status: DC

## 2013-02-22 MED ORDER — RANITIDINE HCL 150 MG PO TABS: 150.0000 mg | ORAL_TABLET | Freq: Two times a day (BID) | ORAL | Status: DC

## 2013-02-22 MED ORDER — HYDROCHLOROTHIAZIDE 25 MG PO TABS: ORAL_TABLET | ORAL | Status: DC

## 2013-02-22 MED ORDER — HYDROXYZINE HCL 25 MG PO TABS: ORAL_TABLET | ORAL | Status: DC

## 2013-02-22 MED ORDER — OMEPRAZOLE 10 MG PO CPDR: 40.0000 mg | DELAYED_RELEASE_CAPSULE | Freq: Every day | ORAL | Status: DC

## 2013-02-22 MED ORDER — SUCRALFATE 1 G PO TABS: 1.0000 g | ORAL_TABLET | Freq: Four times a day (QID) | ORAL | Status: DC

## 2013-02-22 NOTE — Progress Notes (Signed)
[facility] [address], [place] [address] [phone]  Date:  02/22/2013   Name:  Becky Lawson   DOB:  March 24, 1951   MRN:  [id number]  PCP:  Jacob Moores, MD    Chief Complaint: Medication Refill   History of Present Illness:  Becky Lawson is a 62 y.o. very pleasant female patient who presents with the following:  Here for a medication refill today. She has been recently diagnosed with Hep C which we think is causing her chronic itching.  She saw Dr. Benson Norway and is now under the care of the hepatology clinic at [facility].  They are currently planning to try treatment for her Hep C, but they need to get clearance by her insurance first as the tx is very expensive.   She still uses hydroxyzine as needed for itching, she needs it less, maybe a few times a week. Her picked areas have all healed.  She feels pretty well, no abdominal pain but she does have some heartburn.  She is using omeprazole OTC- started this about one week ago.  Also uses ranitidine in the am.  This helps some but she continues to have gerd and epigastric pain.   When she eats or takes her medications she will notice burning pain in her epigastric area, and some nausea but no vomiting.   She does have some diarrhea since she strated her rifampin. The GERD also started when she began the rifampin a few months ago.   She has been off her hctz for about one week.  She has noted that her BP is still high when she checks it at the store- up to 170/ 90.   Patient Active Problem List   Diagnosis Date Noted  . Hepatitis C     Past Medical History  Diagnosis Date  . Hypertension   . Anxiety   . Bipolar 1 disorder   . Hyperlipidemia   . Depression   . Hepatitis C     genotype 1b, followed at [facility]    Past Surgical History  Procedure Laterality Date  . Joint replacement      knee  . Partial hysterectomy      History  Substance Use Topics  . Smoking status: Former Research scientist (life sciences)  . Smokeless  tobacco: Not on file  . Alcohol Use: No    Family History  Problem Relation Age of Onset  . Diabetes Mother   . Heart disease Mother   . Hypertension Mother   . Hypertension Father   . Parkinson's disease Father   . Hemachromatosis Daughter     No Known Allergies  Medication list has been reviewed and updated.  Current Outpatient Prescriptions on File Prior to Visit  Medication Sig Dispense Refill  . buPROPion (WELLBUTRIN XL) 150 MG 24 hr tablet Take 150 mg by mouth every morning.      . clonazePAM (KLONOPIN) 1 MG tablet Take 1 tablet (1 mg total) by mouth 2 (two) times daily as needed for anxiety.  60 tablet  1  . Escitalopram Oxalate (LEXAPRO PO) Take 1 tablet by mouth daily.      . hydrochlorothiazide (HYDRODIURIL) 25 MG tablet TAKE 1 TABLET EVERY DAY  30 tablet  0  . hydrOXYzine (ATARAX/VISTARIL) 25 MG tablet TAKE 2 TABLETS (50 MG TOTAL) BY MOUTH EVERY 6 (SIX) HOURS AS NEEDED FOR ITCHING.  40 tablet  0  . hydrOXYzine (ATARAX/VISTARIL) 25 MG tablet Take 2 tablets (50 mg  total) by mouth every 8 (eight) hours as needed for itching.  120 tablet  1  . ivermectin (STROMECTOL) 3 MG TABS Take 1 tablet (3 mg total) by mouth once.  4 tablet  0  . lamoTRIgine (LAMICTAL) 200 MG tablet Take 200 mg by mouth 2 (two) times daily.      [name] lovastatin (MEVACOR) 40 MG tablet TAKE 1 TABLET EVERY DAY  30 tablet  0  . methylphenidate (RITALIN) 10 MG tablet Take 20 mg by mouth every morning.      . promethazine (PHENERGAN) 12.5 MG tablet Take 1 tablet (12.5 mg total) by mouth every 8 (eight) hours as needed for nausea.  90 tablet  2  . ranitidine (ZANTAC) 150 MG tablet Take 1 tablet (150 mg total) by mouth 2 (two) times daily. PATIENT NEEDS OFFICE VISIT FOR ADDITIONAL REFILLS  60 tablet  0  . trazodone (DESYREL) 300 MG tablet Take 0.5 tablets (150 mg total) by mouth at bedtime.  30 tablet  1  . diphenhydrAMINE (BENADRYL) 25 MG tablet Take 50 mg by mouth every 6 (six) hours as needed.       No current  facility-administered medications on file prior to visit.    Review of Systems:  As per HPI- otherwise negative.   Physical Examination: Filed Vitals:   02/22/13 0931  BP: 138/70  Pulse: 65  Temp: 98.1 F (36.7 C)  Resp: 16   Filed Vitals:   02/22/13 0931  Height: 5\' 4"  (1.626 m)  Weight: 161 lb (73.029 kg)   Body mass index is 27.62 kg/(m^2). Ideal Body Weight: Weight in (lb) to have BMI = 25: 145.3  GEN: WDWN, NAD, Non-toxic, A & O x 3, overweight but has lost weight HEENT: Atraumatic, Normocephalic. Neck supple. No masses, No LAD. Scleral icterus, skin is slightly janudiced Ears and Nose: No external deformity. CV: RRR, No M/G/R. No JVD. No thrill. No extra heart sounds. PULM: CTA B, no wheezes, crackles, rhonchi. No retractions. No resp. distress. No accessory muscle use. ABD: S, ND, +BS. No rebound. No HSM.  Minimal epigastric TTP EXTR: No c/c/e NEURO Normal gait.  PSYCH: Normally interactive. Conversant. Not depressed or anxious appearing.  Calm demeanor.    Assessment and Plan: HTN (hypertension) - Plan: hydrochlorothiazide (HYDRODIURIL) 25 MG tablet, hydrochlorothiazide (HYDRODIURIL) 25 MG tablet  GERD (gastroesophageal reflux disease) - Plan: sucralfate (CARAFATE) 1 G tablet, ranitidine (ZANTAC) 150 MG tablet, omeprazole (PRILOSEC) 40 MG capsule, DISCONTINUED: omeprazole (PRILOSEC) 10 MG capsule, DISCONTINUED: ranitidine (ZANTAC) 150 MG tablet  Itching - Plan: hydrOXYzine (ATARAX/VISTARIL) 25 MG tablet  HTN: discussed stopping/ decreasing her HCTZ as she has lost weight and her BP looks fine today.  However, she reports it is often still high when she checks it at the drugstore, so she would like to continue for now.  GERD: she is using ranitidine and prilosec OTC.  Her GERD may be due to rifampin use.  However, I am not sure if she is safe to take ranitidine and prilosec given her current liver issues.  Will start carafate for 7- 10 days.  She agreed to call her  hepatology clinic and ask if she is clear to use ranitidine and omeprazole.  She will also ask them to send me a copy of her most recent labs (from one week ago, she reports she had a full panel).    Refilled her hydroxyzine to use as needed for itching.    Signed Lamar Blinks, MD

## 2013-02-22 NOTE — Patient Instructions (Addendum)
Use the atarax as needed for itching.   Continue to see your hepatology clinic as needed.  Please give them a call regarding your omeprazole and ranitidine therapy and be sure they think these medications are ok with your current liver function. Also ask them if you should continue to take your lovastatin for cholesterol.   Try the carafate for GERD- take one pill before meals and one before bed for the next 7- 10 days.    Use the HCTZ for your blood pressure.    Please let us know if we can do anything else to help.

## 2013-03-02 ENCOUNTER — Other Ambulatory Visit: Payer: Self-pay | Admitting: Family Medicine

## 2013-04-25 ENCOUNTER — Ambulatory Visit (INDEPENDENT_AMBULATORY_CARE_PROVIDER_SITE_OTHER): Payer: Medicare Other | Admitting: Family Medicine

## 2013-04-25 VITALS — BP 122/62 | HR 77 | Temp 98.0°F | Resp 17 | Ht 64.0 in | Wt 160.0 lb

## 2013-04-25 DIAGNOSIS — R011 Cardiac murmur, unspecified: Secondary | ICD-10-CM

## 2013-04-25 DIAGNOSIS — D539 Nutritional anemia, unspecified: Secondary | ICD-10-CM

## 2013-04-25 DIAGNOSIS — Z79899 Other long term (current) drug therapy: Secondary | ICD-10-CM

## 2013-04-25 DIAGNOSIS — B1920 Unspecified viral hepatitis C without hepatic coma: Secondary | ICD-10-CM

## 2013-04-25 DIAGNOSIS — B150 Hepatitis A with hepatic coma: Secondary | ICD-10-CM

## 2013-04-25 DIAGNOSIS — L299 Pruritus, unspecified: Secondary | ICD-10-CM

## 2013-04-25 LAB — POCT CBC
MCH, POC: 32.1 pg — AB (ref 27–31.2)
MCHC: 32.8 g/dL (ref 31.8–35.4)
MCV: 97.8 fL — AB (ref 80–97)
MPV: 8.9 fL (ref 0–99.8)
Platelet Count, POC: 202 10*3/uL (ref 142–424)
RBC: 3.77 M/uL — AB (ref 4.04–5.48)
RDW, POC: 13.8 %

## 2013-04-25 LAB — BILIRUBIN,DIRECT & INDIRECT (FRACTIONATED): Indirect Bilirubin: 7.1 mg/dL — ABNORMAL HIGH (ref 0.0–0.9)

## 2013-04-25 LAB — COMPREHENSIVE METABOLIC PANEL
ALT: 45 U/L — ABNORMAL HIGH (ref 0–35)
AST: 86 U/L — ABNORMAL HIGH (ref 0–37)
Alkaline Phosphatase: 153 U/L — ABNORMAL HIGH (ref 39–117)
BUN: 11 mg/dL (ref 6–23)
Calcium: 8.8 mg/dL (ref 8.4–10.5)
Chloride: 102 mEq/L (ref 96–112)
Creat: 0.68 mg/dL (ref 0.50–1.10)
Total Bilirubin: 16.5 mg/dL — ABNORMAL HIGH (ref 0.3–1.2)

## 2013-04-25 LAB — FOLATE: Folate: >20 ng/mL

## 2013-04-25 LAB — VITAMIN B12: Vitamin B-12: 785 pg/mL (ref 211–911)

## 2013-04-25 MED ORDER — HYDROXYZINE HCL 25 MG PO TABS: 50.0000 mg | ORAL_TABLET | ORAL | Status: DC | PRN

## 2013-04-25 MED ORDER — LOPERAMIDE HCL 2 MG PO CAPS: 2.0000 mg | ORAL_CAPSULE | Freq: Four times a day (QID) | ORAL | Status: DC | PRN

## 2013-04-25 NOTE — Progress Notes (Signed)
Subjective:    Patient ID: Becky Lawson, female    DOB: 1951-01-27, 62 y.o.   MRN: [id number] Chief Complaint  Patient presents with  . Hepatitis C    HPI  Jaundice is getting worse, itching is getting worse, have intermittent diarrhea but not "normal diarrhea" - stools are sludgy - not just liquid.  Take a lot of otc loperamide. No abd pain. No abd distension. Last appt with Dr. Patsy [place] was in May - she is waiting to hear if she is going to be approved for the new Hep C treatment.  She has had labs since the May visit but doesn't remember what or when and we do not have the results - even in the care everywhere [facility] section.  She called the Hep C clinic last week with her concerns of recurrent pruritis and worsening diarrhea but has not received a call back yet.  Does not have an appt for f/u w/ Dr. Patsy [place] scheduled yet.  Is taking hydroxyzine 2 tabs every 3-4 hours to relieve itching - if not she is scared that she will develop sores from itching to much  Used to be on lovastatin and didn't know if she should restart it.  Not fasting today.  Didn't think her BP was checked correctly.  Would like it rechecked.  Her jaundice is at baseline for her.  Had an episode of several weeks length where she was confused - couldn't remember how to tie her shoes or write certain letters - thought she might have had a TIA - now completely resolved and mentally feels completely at baseline.  Past Medical History  Diagnosis Date  . Hypertension   . Anxiety   . Bipolar 1 disorder   . Hyperlipidemia   . Depression   . Hepatitis C     genotype 1b, followed at [facility]   Current Outpatient Prescriptions on File Prior to Visit  Medication Sig Dispense Refill  . buPROPion (WELLBUTRIN XL) 150 MG 24 hr tablet Take 150 mg by mouth every morning.      . clonazePAM (KLONOPIN) 1 MG tablet Take 1 tablet (1 mg total) by mouth 2 (two) times daily as needed for anxiety.  60 tablet  1  . diphenhydrAMINE (BENADRYL) 25 MG  tablet Take 50 mg by mouth every 6 (six) hours as needed.      . hydrochlorothiazide (HYDRODIURIL) 25 MG tablet TAKE 1 TABLET EVERY DAY  90 tablet  3  . lamoTRIgine (LAMICTAL) 200 MG tablet Take 200 mg by mouth 2 (two) times daily.      [name] omeprazole (PRILOSEC) 40 MG capsule Take 1 capsule (40 mg total) by mouth daily.  90 capsule  1  . ranitidine (ZANTAC) 150 MG tablet Take 1 tablet (150 mg total) by mouth 2 (two) times daily.  60 tablet  0  . trazodone (DESYREL) 300 MG tablet Take 0.5 tablets (150 mg total) by mouth at bedtime.  30 tablet  1   No current facility-administered medications on file prior to visit.   No Known Allergies   Review of Systems  Constitutional: Positive for activity change, appetite change and fatigue. Negative for fever, chills and unexpected weight change.  Respiratory: Negative for shortness of breath and wheezing.        No orthopnea  Cardiovascular: Negative for chest pain, palpitations and leg swelling.  Gastrointestinal: Positive for diarrhea. Negative for nausea, vomiting, abdominal pain, constipation, blood in stool, abdominal distention and anal bleeding.  Genitourinary: Negative for  dysuria, decreased urine volume and difficulty urinating.  Musculoskeletal: Negative for gait problem.  Skin: Positive for color change and rash.       itching  Hematological: Negative for adenopathy.  Psychiatric/Behavioral: Negative for sleep disturbance. The patient is nervous/anxious.       BP 122/62  Pulse 77  Temp(Src) 98 F (36.7 C) (Oral)  Resp 17  Ht 5\' 4"  (1.626 m)  Wt 160 lb (72.576 kg)  BMI 27.45 kg/m2  SpO2 98% Objective:   Physical Exam  Constitutional: She is oriented to person, place, and time. She appears well-developed and well-nourished. No distress.  HENT:  Head: Normocephalic and atraumatic.  Eyes: Scleral icterus is present.  Neck: Normal range of motion. Neck supple. No thyromegaly present.  Cardiovascular: Normal rate, regular rhythm, S2  normal and intact distal pulses.   Murmur heard.  Decrescendo systolic murmur is present with a grade of 3/6  Pulses:      Dorsalis pedis pulses are 2+ on the right side, and 2+ on the left side.  No lower ext edema  Pulmonary/Chest: Effort normal and breath sounds normal. No respiratory distress.  Abdominal: Soft. Normal appearance and bowel sounds are normal. She exhibits no ascites. There is no hepatosplenomegaly. There is tenderness in the epigastric area. There is no rigidity, no rebound, no guarding and negative Murphy's sign.  Musculoskeletal: She exhibits no edema.  Lymphadenopathy:    She has no cervical adenopathy.  Neurological: She is alert and oriented to person, place, and time.  Skin: Skin is warm and dry. She is not diaphoretic. No erythema.  Moderate diffuse jaundice  Psychiatric: She has a normal mood and affect. Her behavior is normal.      Results for orders placed in visit on 04/25/13  POCT CBC      Result Value Range   WBC 7.2  4.6 - 10.2 K/uL   Lymph, poc 1.7  0.6 - 3.4   POC LYMPH PERCENT 23.1  10 - 50 %L   MID (cbc) 0.6  0 - 0.9   POC MID % 8.8  0 - 12 %M   POC Granulocyte 4.9  2 - 6.9   Granulocyte percent 68.1  37 - 80 %G   RBC 3.77 (*) 4.04 - 5.48 M/uL   Hemoglobin 12.1 (*) 12.2 - 16.2 g/dL   HCT, POC 36.9 (*) 37.7 - 47.9 %   MCV 97.8 (*) 80 - 97 fL   MCH, POC 32.1 (*) 27 - 31.2 pg   MCHC 32.8  31.8 - 35.4 g/dL   RDW, POC 13.8     Platelet Count, POC 202  142 - 424 K/uL   MPV 8.9  0 - 99.8 fL    Assessment & Plan:  Needs labs - lamictal level - faxed to Mr. Shugart at [place] Fax #: [phone] and faxed to Dr. Patsy [place] office as Festus Aloe  Hepatitis C - Plan: POCT CBC, Comprehensive metabolic panel, Lamotrigine level, Bilirubin,Direct/Indirect(Fractionated) - check labs, if worsening will need to sched f/u w/ Dr. Patsy [place].  Encounter for long-term (current) use of other medications - Plan: POCT CBC, Comprehensive metabolic panel, Lamotrigine  level  Undiagnosed cardiac murmurs - Seems to be new per pt and per hx - will recheck at f/u in 2-3 wks - if persists - refer for echo.  Itching - Plan: hydrOXYzine (ATARAX/VISTARIL) 25 MG tablet,  Ok to use 2 tabs every 4 hours prn  Diarrhea - ok to cont prn imodium. Could be gallbladder  but as not having any abd pain or even abd pain with the diarrhea or after eating will defer for now.  If LFTs worsening, could consider repeating US.  Macrocytic anemia - Plan: Vitamin B12, Folate - anemia new.  Recheck at f/u in 2-3 wks.  HPL - Not fasting today. Sched f/u appt in 2-3 wks for fasting lipids and will also eval pt's concern for nail fungus at that time.  Meds ordered this encounter  Medications  . Lurasidone HCl (LATUDA) 120 MG TABS    Sig: Take by mouth.  . DISCONTD: loperamide (IMODIUM) 2 MG capsule    Sig: Take 2 mg by mouth 4 (four) times daily as needed for diarrhea or loose stools.  . rifampin (RIFADIN) 300 MG capsule    Sig: Take 300 mg by mouth. Take 2 capsule (600 mg total) by mouth once a day.  [name] DISCONTD: hydrOXYzine (ATARAX/VISTARIL) 25 MG tablet    Sig: Take 2 tablets (50 mg total) by mouth every 4 (four) hours as needed for itching.    Dispense:  240 tablet    Refill:  1  . DISCONTD: loperamide (IMODIUM) 2 MG capsule    Sig: Take 1 capsule (2 mg total) by mouth 4 (four) times daily as needed for diarrhea or loose stools.    Dispense:  80 capsule    Refill:  3  . hydrOXYzine (ATARAX/VISTARIL) 25 MG tablet    Sig: Take 2 tablets (50 mg total) by mouth every 4 (four) hours as needed for itching.    Dispense:  240 tablet    Refill:  1  . loperamide (IMODIUM) 2 MG capsule    Sig: Take 1 capsule (2 mg total) by mouth 4 (four) times daily as needed for diarrhea or loose stools.    Dispense:  80 capsule    Refill:  3

## 2013-05-15 ENCOUNTER — Encounter: Payer: Self-pay | Admitting: Family Medicine

## 2013-05-16 ENCOUNTER — Encounter: Payer: Self-pay | Admitting: Family Medicine

## 2013-05-16 ENCOUNTER — Telehealth: Payer: Self-pay | Admitting: Radiology

## 2013-05-16 NOTE — Telephone Encounter (Signed)
Patient has had recent labs at our office, we have attempted to fax to your office but the faxes are not going through, am forwarding to you in computer. Thanks. Amy/ Dr Everlene Farrier Vitamin B-12 211 - 911 pg/mL  785        Range 3wk ago  69yr ago    Folate ng/mL >20.0

## 2013-05-16 NOTE — Telephone Encounter (Signed)
To Dr Patsy [place]

## 2013-05-22 ENCOUNTER — Encounter: Payer: Self-pay | Admitting: Family Medicine

## 2013-05-24 ENCOUNTER — Other Ambulatory Visit: Payer: Self-pay | Admitting: Family Medicine

## 2013-05-24 ENCOUNTER — Encounter: Payer: Self-pay | Admitting: Family Medicine

## 2013-05-24 ENCOUNTER — Ambulatory Visit (INDEPENDENT_AMBULATORY_CARE_PROVIDER_SITE_OTHER): Payer: Medicare Other | Admitting: Family Medicine

## 2013-05-24 VITALS — BP 140/75 | HR 69 | Temp 98.5°F | Resp 16 | Ht 64.0 in | Wt 170.0 lb

## 2013-05-24 DIAGNOSIS — B1920 Unspecified viral hepatitis C without hepatic coma: Secondary | ICD-10-CM

## 2013-05-24 DIAGNOSIS — K7290 Hepatic failure, unspecified without coma: Secondary | ICD-10-CM

## 2013-05-24 DIAGNOSIS — E785 Hyperlipidemia, unspecified: Secondary | ICD-10-CM

## 2013-05-24 DIAGNOSIS — D649 Anemia, unspecified: Secondary | ICD-10-CM

## 2013-05-24 DIAGNOSIS — K769 Liver disease, unspecified: Secondary | ICD-10-CM

## 2013-05-24 DIAGNOSIS — R011 Cardiac murmur, unspecified: Secondary | ICD-10-CM

## 2013-05-24 DIAGNOSIS — R002 Palpitations: Secondary | ICD-10-CM

## 2013-05-24 LAB — LIPID PANEL
LDL Cholesterol: 191 mg/dL — ABNORMAL HIGH (ref 0–99)
VLDL: 30 mg/dL (ref 0–40)

## 2013-05-24 LAB — CBC WITH DIFFERENTIAL/PLATELET
Basophils Absolute: 0 10*3/uL (ref 0.0–0.1)
Basophils Relative: 0 % (ref 0–1)
Eosinophils Absolute: 0 10*3/uL (ref 0.0–0.7)
HCT: 32.8 % — ABNORMAL LOW (ref 36.0–46.0)
Hemoglobin: 11.1 g/dL — ABNORMAL LOW (ref 12.0–15.0)
MCH: 32.9 pg (ref 26.0–34.0)
MCHC: 33.8 g/dL (ref 30.0–36.0)
Monocytes Absolute: 0.5 10*3/uL (ref 0.1–1.0)
Monocytes Relative: 12 % (ref 3–12)
Neutrophils Relative %: 62 % (ref 43–77)
RDW: 15.7 % — ABNORMAL HIGH (ref 11.5–15.5)

## 2013-05-24 LAB — COMPREHENSIVE METABOLIC PANEL
ALT: 59 U/L — ABNORMAL HIGH (ref 0–35)
AST: 94 U/L — ABNORMAL HIGH (ref 0–37)
Albumin: 3.5 g/dL (ref 3.5–5.2)
Alkaline Phosphatase: 141 U/L — ABNORMAL HIGH (ref 39–117)
BUN: 14 mg/dL (ref 6–23)
Chloride: 105 mEq/L (ref 96–112)
Potassium: 4.5 mEq/L (ref 3.5–5.3)

## 2013-05-24 NOTE — Progress Notes (Signed)
Subjective:    Patient ID: Becky Lawson, female    DOB: Jan 25, 1951, 62 y.o.   MRN: [id number] Chief Complaint  Patient presents with  . Follow-up    labs   HPI  Feet were tremendously swollen - no prior episode of this - lasted for about a week until she kept her feet up for a while.  No SHoB, orthopnea, DOE. No CP, no presyncope, has had palpitations over the past few months  - new since the past yr - but didn't seem to occur particularly when feet where swollen.  Stools are better  - color is variable but diarrhea largely resolved.  Actually had to take a laxative sev wks ago and has had some intermittent constipations. No melena or hematochezia, no n/v. Has appt with Dr. Patsy [place] next week.    Fasting today  Taking 2-4 hydroxyzine daily as needed.  Itching and jaundice improving recently.  Having abdominal discomfort in LLQ esp when she strains or goes to the bathroom but now doesn't like to have any pressure in the area at all - even a pillow can cause discomfort.    Past Medical History  Diagnosis Date  . Hypertension   . Anxiety   . Bipolar 1 disorder   . Hyperlipidemia   . Depression   . Hepatitis C     genotype 1b, followed at [facility]   Current Outpatient Prescriptions on File Prior to Visit  Medication Sig Dispense Refill  . buPROPion (WELLBUTRIN XL) 150 MG 24 hr tablet Take 150 mg by mouth every morning.      . clonazePAM (KLONOPIN) 1 MG tablet Take 1 tablet (1 mg total) by mouth 2 (two) times daily as needed for anxiety.  60 tablet  1  . diphenhydrAMINE (BENADRYL) 25 MG tablet Take 50 mg by mouth every 6 (six) hours as needed.      . hydrochlorothiazide (HYDRODIURIL) 25 MG tablet TAKE 1 TABLET EVERY DAY  90 tablet  3  . hydrOXYzine (ATARAX/VISTARIL) 25 MG tablet Take 2 tablets (50 mg total) by mouth every 4 (four) hours as needed for itching.  240 tablet  1  . lamoTRIgine (LAMICTAL) 200 MG tablet Take 200 mg by mouth 2 (two) times daily.      [name] loperamide (IMODIUM) 2 MG  capsule Take 1 capsule (2 mg total) by mouth 4 (four) times daily as needed for diarrhea or loose stools.  80 capsule  3  . Lurasidone HCl (LATUDA) 120 MG TABS Take by mouth.      [name] omeprazole (PRILOSEC) 40 MG capsule Take 1 capsule (40 mg total) by mouth daily.  90 capsule  1  . ranitidine (ZANTAC) 150 MG tablet Take 1 tablet (150 mg total) by mouth 2 (two) times daily.  60 tablet  0  . trazodone (DESYREL) 300 MG tablet Take 0.5 tablets (150 mg total) by mouth at bedtime.  30 tablet  1   No current facility-administered medications on file prior to visit.   No Known Allergies  Review of Systems  Constitutional: Positive for fatigue and unexpected weight change. Negative for fever, chills, activity change and appetite change.  Respiratory: Negative for apnea, chest tightness, shortness of breath and wheezing.   Cardiovascular: Positive for palpitations and leg swelling. Negative for chest pain.  Gastrointestinal: Positive for nausea, abdominal pain and constipation. Negative for vomiting, diarrhea, blood in stool, abdominal distention, anal bleeding and rectal pain.  Genitourinary: Negative for dysuria, decreased urine volume and difficulty urinating.  Worsening urinary incontinence.  Musculoskeletal: Negative for gait problem.  Skin: Negative for rash.  Hematological: Negative for adenopathy.      BP 140/75  Pulse 69  Temp(Src) 98.5 F (36.9 C) (Oral)  Resp 16  Ht 5\' 4"  (1.626 m)  Wt 170 lb (77.111 kg)  BMI 29.17 kg/m2  SpO2 98% Objective:   Physical Exam  Constitutional: She is oriented to person, place, and time. She appears well-developed and well-nourished. No distress.  HENT:  Head: Normocephalic and atraumatic.  Right Ear: External ear normal.  Left Ear: External ear normal.  Eyes: Conjunctivae are normal. No scleral icterus.  Neck: Normal range of motion. Neck supple. No thyromegaly present.  Cardiovascular: Normal rate, regular rhythm, S2 normal and intact distal  pulses.   Murmur heard.  Decrescendo systolic murmur is present with a grade of 3/6  Pulmonary/Chest: Effort normal and breath sounds normal. No respiratory distress.  Abdominal: Soft. Bowel sounds are normal. She exhibits no distension and no mass. There is no hepatosplenomegaly. There is generalized tenderness. There is positive Murphy's sign. There is no rebound and no guarding. A hernia is present. Hernia confirmed positive in the ventral area.  Small umbilical hernia  Musculoskeletal: She exhibits no edema.  Lymphadenopathy:    She has no cervical adenopathy.  Neurological: She is alert and oriented to person, place, and time.  Skin: Skin is warm and dry. She is not diaphoretic. No erythema.  Psychiatric: She has a normal mood and affect. Her behavior is normal.      Assessment & Plan:  Undiagnosed cardiac murmurs - Plan: 2D Echocardiogram without contrast - pt reluctant for cardiology referral at this time as wants to minimize meds and copays so will just check echo first and then discuss options.  Hepatitis C - Plan: Comprehensive metabolic panel - pt to f/u with Hep C clinic Dr. Patsy [place] next week. Will have labs faxed to his office again.  Liver failure - Plan: Comprehensive metabolic panel - pt clinically appears to be improving - MUCH less jaundice today and less pruritis so hopefully improving though concern for worsening liver failure with last labs due to decreasing transaminases, decreasing alb, increasing bili.  Anemia - Plan: CBC with Differential - new, likely anemia of chronic disease as nml b12/folate - recheck and if still anemic, check retics and iron panel  Other and unspecified hyperlipidemia - Plan: Lipid panel - no statin due to liver dysfunction - prev on lipitor  Palpitations - Plan: TSH  Onychomycosis - try topical vics vaporub bid  Meds ordered this encounter  Medications  . SOVALDI 400 MG TABS    Sig:   . OLYSIO 150 MG CAPS    Sig:   . promethazine  (PHENERGAN) 12.5 MG tablet    Sig:

## 2013-05-26 ENCOUNTER — Encounter: Payer: Self-pay | Admitting: Family Medicine

## 2013-05-26 LAB — IRON AND TIBC
Iron: 166 ug/dL — ABNORMAL HIGH (ref 42–145)
UIBC: 194 ug/dL (ref 125–400)

## 2013-05-27 NOTE — Telephone Encounter (Signed)
Patient is requesting AVS and labs be mailed to her.   [phone]

## 2013-05-29 ENCOUNTER — Telehealth: Payer: Self-pay | Admitting: Family Medicine

## 2013-05-29 NOTE — Telephone Encounter (Signed)
Discussed case with Dr. Patsy [place]. Pt has f/u with him on 06/06/13 but he has not received any outside records yet. Please fax notes and all labs from August and Sept to Dr. Patsy [place] at fax: [phone].  If that fax # does not work, a back up is [phone].

## 2013-05-30 NOTE — Telephone Encounter (Signed)
Records faxed to Dr Patsy [place] at [phone] thru [facility]

## 2013-06-03 ENCOUNTER — Telehealth: Payer: Self-pay

## 2013-06-03 NOTE — Telephone Encounter (Signed)
I think that's just where they were going to do the echocardiogram.  I did NOT refer pt to see a cardiologist - she was just getting the testing done there. If she gets the testing done elsewhere, there is a good chance it will not be in our computer system and so results take much longer to receive and have the potential to get lost.  But if she chooses to see a cardiologist elsewhere that is fine, she just needs to make very sure that the notes get sent to [place].

## 2013-06-03 NOTE — Telephone Encounter (Signed)
Pt is calling because she was referred to [facility] and she did not want to go there so she cancelled her apt there and made another apt she did not leave message stating were to but she wanted to let Dr Brigitte Pulse know Call back number is [phone]

## 2013-06-04 NOTE — Telephone Encounter (Signed)
Thanks I have called her. She indicates [facility] will do the study. She will have the report of the study sent to our office. Will you send the referral to Dr Einar Gip at [place].

## 2013-06-06 ENCOUNTER — Other Ambulatory Visit (HOSPITAL_COMMUNITY): Payer: Medicare Other

## 2013-06-18 ENCOUNTER — Encounter: Payer: Self-pay | Admitting: Family Medicine

## 2013-09-19 ENCOUNTER — Other Ambulatory Visit: Payer: Self-pay

## 2013-09-19 DIAGNOSIS — K219 Gastro-esophageal reflux disease without esophagitis: Secondary | ICD-10-CM

## 2013-09-19 MED ORDER — OMEPRAZOLE 40 MG PO CPDR: 40.0000 mg | DELAYED_RELEASE_CAPSULE | Freq: Every day | ORAL | Status: DC

## 2013-09-26 ENCOUNTER — Encounter: Payer: Self-pay | Admitting: *Deleted

## 2013-09-26 DIAGNOSIS — R011 Cardiac murmur, unspecified: Secondary | ICD-10-CM

## 2013-09-26 DIAGNOSIS — I10 Essential (primary) hypertension: Secondary | ICD-10-CM | POA: Insufficient documentation

## 2013-09-28 ENCOUNTER — Telehealth: Payer: Self-pay

## 2013-09-28 NOTE — Telephone Encounter (Signed)
Patient is calling to let dr Brigitte Pulse know that she appreciates what she has done, and the medicine has cured her!!  If you need to call her back the number is [phone]

## 2013-10-29 ENCOUNTER — Telehealth: Payer: Self-pay | Admitting: Physician Assistant

## 2013-10-29 NOTE — Telephone Encounter (Signed)
Spoke with Dr. Einar Gip who saw this patient today and she has mildly elevated LFT's and also has hyperlipidemia. He recommends she start lipitor or crestor and monitor LFT's closely at onset - 2 weeks after starting, then in 1 month, then 2 months and if stable can recheck q34months.   Please contact patient and have her schedule an appointment with Dr. Marin Comment (she is pt of Dr. Raul Del).   Will forward this message to Dr. Marin Comment and scheduling pool

## 2013-10-30 ENCOUNTER — Telehealth: Payer: Self-pay | Admitting: Family Medicine

## 2013-10-30 NOTE — Telephone Encounter (Signed)
LMOM to CB. 

## 2013-11-15 NOTE — Telephone Encounter (Signed)
Spoke with pt about making appt with Dr. Marin Comment to follow up on hyperlipidemia. She just started a new job and will call our office back to set up an appt once she knows her new schedule.

## 2014-01-23 ENCOUNTER — Ambulatory Visit (INDEPENDENT_AMBULATORY_CARE_PROVIDER_SITE_OTHER): Payer: Medicare Other | Admitting: Family Medicine

## 2014-01-23 VITALS — BP 124/76 | HR 75 | Temp 98.7°F | Resp 16 | Ht 64.0 in | Wt 175.6 lb

## 2014-01-23 MED ORDER — HYDROCODONE-IBUPROFEN 5-200 MG PO TABS: 1.0000 | ORAL_TABLET | Freq: Every evening | ORAL | Status: DC | PRN

## 2014-01-23 NOTE — Progress Notes (Signed)
Subjective:    Patient ID: Becky Lawson, female    DOB: [birth year], 63 y.o.   MRN: [id number] This chart was scribed for [name], MD by Vernell Barrier, Medical Scribe. The patient was seen in room 5. This patient's care was started at 2:06 PM.  Chief Complaint  Patient presents with  . Chol. Check    Per Cardiologist  . Hip Pain    Left since fall , x 2 months   . Dental Pain    has chipped tooth    Hip Pain   Dental Pain    HPI Comments: Becky Lawson is a 63 y.o. female who presents to the [facility] for chol check, hip pain, and dental pain. Patient's last cholesterol check on LDL was 191.  Cardiologist recommended she check cholesterol. Told her prior murmur was a result of one of the valves not working correctly but was of no serious concern. Currently on Welchol but was also advised to consider a statin if Welchol did not provide relief. Been on that medication for 2-3 months. Last ate 6 hours ago. Has 2% scarring on liver from hepatitis.   States she fell 2 months ago and now reports left hip and left hand pain.  Also reports 2 teeth are disintegrating down to the nerve. Would like some medication to help sleep at night until she can get to see a dentist.  On disability for bipolor disorder.  Patient Active Problem List   Diagnosis Date Noted  . Unspecified essential hypertension 09/26/2013  . Undiagnosed cardiac murmurs 09/26/2013  . Pruritic disorder 02/13/2013  . Hepatitis C   . Chronic hepatitis C 11/01/2012  . Jaundice 11/01/2012   Past Medical History  Diagnosis Date  . Hypertension   . Anxiety   . Bipolar 1 disorder   . Hyperlipidemia   . Depression   . Hepatitis C     genotype 1b, followed at [facility]   Past Surgical History  Procedure Laterality Date  . Joint replacement      knee  . Partial hysterectomy     No Known Allergies Prior to Admission medications   Medication Sig Start Date End Date Taking? Authorizing Provider    buPROPion (WELLBUTRIN XL) 150 MG 24 hr tablet Take 150 mg by mouth every morning.   Yes Historical Provider, MD  colesevelam (WELCHOL) 625 MG tablet Take by mouth 2 (two) times daily with a meal.   Yes Historical Provider, MD  hydrochlorothiazide (HYDRODIURIL) 25 MG tablet TAKE 1 TABLET EVERY DAY 02/22/13  Yes Gay Filler Copland, MD  hydrOXYzine (ATARAX/VISTARIL) 25 MG tablet Take 2 tablets (50 mg total) by mouth every 4 (four) hours as needed for itching. 04/25/13  Yes Shawnee Knapp, MD  lamoTRIgine (LAMICTAL) 200 MG tablet Take 200 mg by mouth 2 (two) times daily.   Yes Historical Provider, MD  omeprazole (PRILOSEC) 40 MG capsule Take 1 capsule (40 mg total) by mouth daily. 09/19/13  Yes Shawnee Knapp, MD  QUEtiapine Fumarate (SEROQUEL PO) Take by mouth 1 day or 1 dose.   Yes Historical Provider, MD  trazodone (DESYREL) 300 MG tablet Take 0.5 tablets (150 mg total) by mouth at bedtime. 09/02/12  Yes Heather M Marte, PA-C  clonazePAM (KLONOPIN) 1 MG tablet Take 1 tablet (1 mg total) by mouth 2 (two) times daily as needed for anxiety. 08/24/12   Robyn Haber, MD  diphenhydrAMINE (BENADRYL) 25 MG tablet Take 50 mg by mouth every 6 (six)  hours as needed.    Historical Provider, MD  loperamide (IMODIUM) 2 MG capsule Take 1 capsule (2 mg total) by mouth 4 (four) times daily as needed for diarrhea or loose stools. 04/25/13   Shawnee Knapp, MD  Lurasidone HCl (LATUDA) 120 MG TABS Take by mouth.    Historical Provider, MD  OLYSIO 150 MG CAPS  05/08/13   Historical Provider, MD  promethazine (PHENERGAN) 12.5 MG tablet  05/04/13   Historical Provider, MD  ranitidine (ZANTAC) 150 MG tablet Take 1 tablet (150 mg total) by mouth 2 (two) times daily. 02/22/13   Darreld Mclean, MD  SOVALDI 400 MG TABS  05/08/13   Historical Provider, MD   History   Social History  . Marital Status: Divorced    Spouse Name: N/A    Number of Children: N/A  . Years of Education: N/A   Occupational History  . Not on file.   Social  History Main Topics  . Smoking status: Former Research scientist (life sciences)  . Smokeless tobacco: Not on file  . Alcohol Use: No  . Drug Use: No  . Sexual Activity: Yes    Birth Control/ Protection: None   Other Topics Concern  . Not on file   Social History Narrative  . No narrative on file   Review of Systems  HENT: Positive for dental problem.   Musculoskeletal: Positive for arthralgias and myalgias.   BP 124/76  Pulse 75  Temp(Src) 98.7 F (37.1 C) (Oral)  Resp 16  Ht 5\' 4"  (1.626 m)  Wt 175 lb 9.6 oz (79.652 kg)  BMI 30.13 kg/m2  SpO2 97%  Objective:  Physical Exam  Nursing note and vitals reviewed. Constitutional: She is oriented to person, place, and time. She appears well-developed and well-nourished. No distress.  HENT:  Head: Normocephalic and atraumatic.  Eyes: EOM are normal.  Neck: Neck supple. No tracheal deviation present.  Cardiovascular: Normal rate.   Pulmonary/Chest: Effort normal. No respiratory distress.  Musculoskeletal: Normal range of motion.  Neurological: She is alert and oriented to person, place, and time.  Skin: Skin is warm and dry.  Psychiatric: She has a normal mood and affect. Her behavior is normal.   Assessment & Plan:   Pain, dental  Meds ordered this encounter  Medications  . QUEtiapine Fumarate (SEROQUEL PO)    Sig: Take by mouth 1 day or 1 dose.  . colesevelam (WELCHOL) 625 MG tablet    Sig: Take by mouth 2 (two) times daily with a meal.  . hydrocodone-ibuprofen (VICOPROFEN) 5-200 MG per tablet    Sig: Take 1 tablet by mouth at bedtime as needed for pain.    Dispense:  15 tablet    Refill:  0    I personally performed the services described in this documentation, which was scribed in my presence. The recorded information has been reviewed and considered, and addended by me as needed.  [name], MD MPH

## 2014-02-21 ENCOUNTER — Encounter: Payer: Self-pay | Admitting: Family Medicine

## 2014-02-21 ENCOUNTER — Ambulatory Visit: Payer: Medicare Other

## 2014-02-21 ENCOUNTER — Telehealth: Payer: Self-pay | Admitting: Family Medicine

## 2014-02-21 ENCOUNTER — Ambulatory Visit (INDEPENDENT_AMBULATORY_CARE_PROVIDER_SITE_OTHER): Payer: Medicare Other | Admitting: Family Medicine

## 2014-02-21 VITALS — BP 126/76 | HR 72 | Temp 98.2°F | Resp 16 | Ht 64.25 in | Wt 173.2 lb

## 2014-02-21 DIAGNOSIS — S6992XD Unspecified injury of left wrist, hand and finger(s), subsequent encounter: Secondary | ICD-10-CM

## 2014-02-21 DIAGNOSIS — I10 Essential (primary) hypertension: Secondary | ICD-10-CM

## 2014-02-21 DIAGNOSIS — E559 Vitamin D deficiency, unspecified: Secondary | ICD-10-CM

## 2014-02-21 DIAGNOSIS — S59919A Unspecified injury of unspecified forearm, initial encounter: Secondary | ICD-10-CM

## 2014-02-21 DIAGNOSIS — Z5189 Encounter for other specified aftercare: Secondary | ICD-10-CM

## 2014-02-21 DIAGNOSIS — Z8719 Personal history of other diseases of the digestive system: Secondary | ICD-10-CM

## 2014-02-21 DIAGNOSIS — Z8619 Personal history of other infectious and parasitic diseases: Secondary | ICD-10-CM

## 2014-02-21 DIAGNOSIS — M25559 Pain in unspecified hip: Secondary | ICD-10-CM

## 2014-02-21 DIAGNOSIS — M25552 Pain in left hip: Secondary | ICD-10-CM

## 2014-02-21 DIAGNOSIS — S61219A Laceration without foreign body of unspecified finger without damage to nail, initial encounter: Secondary | ICD-10-CM

## 2014-02-21 DIAGNOSIS — Z79899 Other long term (current) drug therapy: Secondary | ICD-10-CM

## 2014-02-21 DIAGNOSIS — B182 Chronic viral hepatitis C: Secondary | ICD-10-CM

## 2014-02-21 DIAGNOSIS — S61209A Unspecified open wound of unspecified finger without damage to nail, initial encounter: Secondary | ICD-10-CM

## 2014-02-21 DIAGNOSIS — S59909A Unspecified injury of unspecified elbow, initial encounter: Secondary | ICD-10-CM

## 2014-02-21 DIAGNOSIS — E785 Hyperlipidemia, unspecified: Secondary | ICD-10-CM

## 2014-02-21 DIAGNOSIS — S6990XA Unspecified injury of unspecified wrist, hand and finger(s), initial encounter: Secondary | ICD-10-CM

## 2014-02-21 DIAGNOSIS — K219 Gastro-esophageal reflux disease without esophagitis: Secondary | ICD-10-CM

## 2014-02-21 MED ORDER — OMEPRAZOLE 40 MG PO CPDR: 40.0000 mg | DELAYED_RELEASE_CAPSULE | Freq: Every day | ORAL | Status: DC

## 2014-02-21 MED ORDER — CARVEDILOL 6.25 MG PO TABS: 6.2500 mg | ORAL_TABLET | Freq: Two times a day (BID) | ORAL | Status: DC

## 2014-02-21 MED ORDER — HYDROCHLOROTHIAZIDE 25 MG PO TABS: ORAL_TABLET | ORAL | Status: DC

## 2014-02-21 NOTE — Telephone Encounter (Signed)
PLEASE SEND PATIENTS RESULTS TO DR Rosebud Ridgely [phone]

## 2014-02-21 NOTE — Progress Notes (Addendum)
This chart was scribed for Shawnee Knapp, MD by Einar Pheasant, ED Scribe. This patient was seen in room 24 and the patient's care was started at 3:09 PM.  Subjective:    Patient ID: Becky Lawson, female    DOB: May 01, 1951, 63 y.o.   MRN: [id number]  Chief Complaint  Patient presents with  . Medication Refill    Pt is fasting  . Hip Pain    Left x 6 mths  tripped and fell over some stuff into the house  . Wrist Pain    Left x6 mths  tripped and fell over some stuff into the house    HPI HPI Comments: Becky Lawson is a 63 y.o. female who presents to the [facility] for medication refill. She is also here about her left hip pain and left wrist pain which started approximately 6 months ago when she tripped and fell over some stuff in her home.   Pt is fasting. She is here for her cholesterol check. She is requesting a Welchol refill. Pt states that she can have her cardiologist to get her a grant for the medication. She also needs a refill on her carvedilol.   Pt has some labs for Dr. Devoria Glassing that she would like to get done as well today.   Today, pt states that her left wrist and left hip is doing bad. She states that she is having decreased ROM secondary to the associated pain.  Pt states that the pain is worsened by bending down. Pt is requesting for her vitamin D to be rechecked. She denies any paraesthesia.   Pt is also concerned about a laceration on her right hand.    Patient Active Problem List   Diagnosis Date Noted  . Unspecified essential hypertension 09/26/2013  . Undiagnosed cardiac murmurs 09/26/2013  . Pruritic disorder 02/13/2013  . Hepatitis C   . Chronic hepatitis C 11/01/2012  . Jaundice 11/01/2012   Past Medical History  Diagnosis Date  . Hypertension   . Anxiety   . Bipolar 1 disorder   . Hyperlipidemia   . Depression   . Hepatitis C     genotype 1b, followed at [facility]   Past Surgical History  Procedure Laterality Date  . Joint replacement       knee  . Partial hysterectomy     No Known Allergies Prior to Admission medications   Medication Sig Start Date End Date Taking? Authorizing Dareth Andrew  buPROPion (WELLBUTRIN XL) 150 MG 24 hr tablet Take 150 mg by mouth every morning.    Historical Bay Wayson, MD  clonazePAM (KLONOPIN) 1 MG tablet Take 1 tablet (1 mg total) by mouth 2 (two) times daily as needed for anxiety. 08/24/12   Robyn Haber, MD  colesevelam [facility]) 625 MG tablet Take by mouth 2 (two) times daily with a meal.    Historical Kosisochukwu Goldberg, MD  diphenhydrAMINE (BENADRYL) 25 MG tablet Take 50 mg by mouth every 6 (six) hours as needed.    Historical Azarias Chiou, MD  hydrochlorothiazide (HYDRODIURIL) 25 MG tablet TAKE 1 TABLET EVERY DAY 02/22/13   Gay Filler Copland, MD  hydrocodone-ibuprofen (VICOPROFEN) 5-200 MG per tablet Take 1 tablet by mouth at bedtime as needed for pain. 01/23/14   Shawnee Knapp, MD  hydrOXYzine (ATARAX/VISTARIL) 25 MG tablet Take 2 tablets (50 mg total) by mouth every 4 (four) hours as needed for itching. 04/25/13   Shawnee Knapp, MD  lamoTRIgine (LAMICTAL) 200 MG tablet Take 200  mg by mouth 2 (two) times daily.    Historical Kriston Mckinnie, MD  loperamide (IMODIUM) 2 MG capsule Take 1 capsule (2 mg total) by mouth 4 (four) times daily as needed for diarrhea or loose stools. 04/25/13   Shawnee Knapp, MD  Lurasidone HCl (LATUDA) 120 MG TABS Take by mouth.    Historical Jazae Gandolfi, MD  OLYSIO 150 MG CAPS  05/08/13   Historical Jorene Kaylor, MD  omeprazole (PRILOSEC) 40 MG capsule Take 1 capsule (40 mg total) by mouth daily. 09/19/13   Shawnee Knapp, MD  promethazine (PHENERGAN) 12.5 MG tablet  05/04/13   Historical Hussein Macdougal, MD  QUEtiapine Fumarate (SEROQUEL PO) Take by mouth 1 day or 1 dose.    Historical Tamika Shropshire, MD  ranitidine (ZANTAC) 150 MG tablet Take 1 tablet (150 mg total) by mouth 2 (two) times daily. 02/22/13   Darreld Mclean, MD  SOVALDI 400 MG TABS  05/08/13   Historical Steve Gregg, MD  trazodone (DESYREL) 300 MG tablet Take  0.5 tablets (150 mg total) by mouth at bedtime. 09/02/12   Collene Leyden, PA-C   History   Social History  . Marital Status: Divorced    Spouse Name: N/A    Number of Children: N/A  . Years of Education: N/A   Occupational History  . Not on file.   Social History Main Topics  . Smoking status: Former Research scientist (life sciences)  . Smokeless tobacco: Not on file  . Alcohol Use: No  . Drug Use: No  . Sexual Activity: Yes    Birth Control/ Protection: None   Other Topics Concern  . Not on file   Social History Narrative  . No narrative on file   Review of Systems  Constitutional: Negative for fatigue and unexpected weight change.  Respiratory: Negative for chest tightness and shortness of breath.   Cardiovascular: Negative for chest pain, palpitations and leg swelling.  Gastrointestinal: Negative for abdominal pain and blood in stool.  Musculoskeletal: Positive for arthralgias.  Skin: Positive for wound.  Neurological: Negative for dizziness, syncope, light-headedness and headaches.       Objective:   Physical Exam  Nursing note and vitals reviewed. Constitutional: She is oriented to person, place, and time. She appears well-developed and well-nourished. No distress.  HENT:  Head: Normocephalic and atraumatic.  Eyes: Conjunctivae and EOM are normal. Pupils are equal, round, and reactive to light.  Neck: Normal range of motion. Neck supple. No thyromegaly present.  Cardiovascular: Normal rate.   Murmur (left upper boarder) heard.  Systolic murmur is present with a grade of 1/6  Pulmonary/Chest: Effort normal and breath sounds normal. No respiratory distress. She has no wheezes. She has no rales.  Abdominal: Soft. Bowel sounds are normal. She exhibits no distension and no mass. There is no tenderness. There is no rebound and no guarding.  Musculoskeletal: Normal range of motion.  Severe pain on dorsal aspect with resistance and extension no pain on ulnar head . Pain with resistance  supination and pronation but FROM except decreased flexion. Right hip normal. Left hip: no pain over trochanteric bursa. Full flexion. FROM but severe pain with internal rotation of the hip and extension with external rotation.   Lymphadenopathy:    She has no cervical adenopathy.  Neurological: She is alert and oriented to person, place, and time.  Skin: Skin is warm and dry.  Laceration to the right second DIP on the volar aspect.  Psychiatric: She has a normal mood and affect. Her behavior is normal.  [facility] reading (PRIMARY) X-Ray reading of left hip by  Dr. Brigitte Pulse. ---mild arthritis EXAM: LEFT HIP - COMPLETE 2+ VIEW  COMPARISON: None  FINDINGS: Symmetric SI joints, preserved.  Mild degenerative changes of the hip joints with joint space narrowing and spur formation at the femoral heads.  No acute fracture, dislocation or bone destruction.  Scattered pelvic phleboliths.  Question degenerative disc and facet disease changes lower lumbar spine.  IMPRESSION: Mild degenerative changes of the hip joints bilaterally.  No acute abnormalities.    [facility] reading (PRIMARY) X-Ray of left wrist by  Dr. Brigitte Pulse. ---No acute abnormalities seen   EXAM: LEFT HIP - COMPLETE 2+ VIEW  COMPARISON: None  FINDINGS: Symmetric SI joints, preserved.  Mild degenerative changes of the hip joints with joint space narrowing and spur formation at the femoral heads.  No acute fracture, dislocation or bone destruction.  Scattered pelvic phleboliths.  Question degenerative disc and facet disease changes lower lumbar spine.  IMPRESSION: Mild degenerative changes of the hip joints bilaterally.  No acute abnormalities.   Filed Vitals:   02/21/14 1457  BP: 126/76  Pulse: 72  Temp: 98.2 F (36.8 C)  TempSrc: Oral  Resp: 16  Height: 5' 4.25" (1.632 m)  Weight: 173 lb 3.2 oz (78.563 kg)  SpO2: 94%        Assessment & Plan:   Other and unspecified hyperlipidemia - Plan:  Comprehensive metabolic panel, Lipid panel - on wellchol from cardiology and working very well! Cont, ok to call for refill if needed.  Unspecified essential hypertension  Unspecified vitamin D deficiency - Plan: Vit D  25 hydroxy (rtn osteoporosis monitoring) - start once weekly rx vit D x 6 mos, then start otc and recheck at f/u in 1 yr  Encounter for long-term (current) use of other medications - Plan: Comprehensive metabolic panel  Laceration of finger, initial encounter  Left wrist injury, subsequent encounter - Plan: DG Wrist Complete Left  Left hip pain - Plan: DG Hip Complete Left  Chronic hepatitis C without mention of hepatic coma - Plan: HCV RNA quant rflx ultra or genotyp, Gamma GT - RESOLVED NOW AFTER TREATMENT!!!!  Essential hypertension - Plan: hydrochlorothiazide (HYDRODIURIL) 25 MG tablet, DISCONTINUED: hydrochlorothiazide (HYDRODIURIL) 25 MG tablet  Gastroesophageal reflux disease without esophagitis - Plan: omeprazole (PRILOSEC) 40 MG capsule  Meds ordered this encounter  Medications  . DISCONTD: hydrochlorothiazide (HYDRODIURIL) 25 MG tablet    Sig: TAKE 1 TABLET EVERY DAY    Dispense:  90 tablet    Refill:  3  . omeprazole (PRILOSEC) 40 MG capsule    Sig: Take 1 capsule (40 mg total) by mouth daily.    Dispense:  90 capsule    Refill:  3  . carvedilol (COREG) 6.25 MG tablet    Sig: Take 1 tablet (6.25 mg total) by mouth 2 (two) times daily with a meal.    Dispense:  180 tablet    Refill:  3  . hydrochlorothiazide (HYDRODIURIL) 25 MG tablet    Sig: TAKE 1 TABLET EVERY DAY    Dispense:  90 tablet    Refill:  3    I personally performed the services described in this documentation, which was scribed in my presence. The recorded information has been reviewed and considered, and addended by me as needed.  [name], MD MPH

## 2014-02-22 LAB — COMPREHENSIVE METABOLIC PANEL
ALT: 18 U/L (ref 0–35)
AST: 29 U/L (ref 0–37)
Albumin: 4.4 g/dL (ref 3.5–5.2)
Alkaline Phosphatase: 105 U/L (ref 39–117)
BILIRUBIN TOTAL: 0.7 mg/dL (ref 0.2–1.2)
BUN: 18 mg/dL (ref 6–23)
CO2: 25 mEq/L (ref 19–32)
CREATININE: 0.77 mg/dL (ref 0.50–1.10)
Calcium: 9.6 mg/dL (ref 8.4–10.5)
Chloride: 106 mEq/L (ref 96–112)
Glucose, Bld: 86 mg/dL (ref 70–99)
Potassium: 4.1 mEq/L (ref 3.5–5.3)
Sodium: 139 mEq/L (ref 135–145)
Total Protein: 6.5 g/dL (ref 6.0–8.3)

## 2014-02-22 LAB — VITAMIN D 25 HYDROXY (VIT D DEFICIENCY, FRACTURES): Vit D, 25-Hydroxy: 15 ng/mL — ABNORMAL LOW (ref 30–89)

## 2014-02-22 LAB — LIPID PANEL
Cholesterol: 180 mg/dL (ref 0–200)
HDL: 49 mg/dL (ref >39)
LDL CALC: 118 mg/dL — AB (ref 0–99)
TRIGLYCERIDES: 64 mg/dL (ref <150)
Total CHOL/HDL Ratio: 3.7 Ratio
VLDL: 13 mg/dL (ref 0–40)

## 2014-02-22 LAB — GAMMA GT: GGT: 50 U/L (ref 7–51)

## 2014-02-24 NOTE — Telephone Encounter (Signed)
Message copied by Belva Chimes on Mon Feb 24, 2014  1:59 PM ------      Message from: [place], EVA      Created: Fri Feb 21, 2014  5:51 PM       Pt needs refill on her Welchol - her cardiologist's office - Dr. Einar Gip - were getting this for her on a scholarship from the drug company? Please call Dr. Irven Shelling office and see if they can refill or if they can send [place] the info so that we can reorder it for pt. Her lipid panel is on Epic if Dr. Einar Gip needs it for a refills.      Thanks,      ES ------

## 2014-02-24 NOTE — Telephone Encounter (Signed)
Spoke with Dr. Irven Shelling office. Sounds like they gave her samples and patient assistance information, but they only prescribed it after her carotid doppler. They referred her to you for further RF's. They said the patient assistance was probable done online with information given in the sample.

## 2014-02-25 LAB — HCV RNA QUANT RFLX ULTRA OR GENOTYP: HCV Quantitative: NOT DETECTED IU/mL (ref <15)

## 2014-02-25 MED ORDER — COLESEVELAM HCL 625 MG PO TABS: 625.0000 mg | ORAL_TABLET | Freq: Two times a day (BID) | ORAL | Status: DC

## 2014-02-25 NOTE — Telephone Encounter (Signed)
LMOM for current refill.

## 2014-02-25 NOTE — Telephone Encounter (Signed)
Please inform pt. Med is working well so ok to refill at current dose x 1 yr.

## 2014-03-24 ENCOUNTER — Telehealth: Payer: Self-pay | Admitting: Radiology

## 2014-03-24 ENCOUNTER — Encounter: Payer: Self-pay | Admitting: Family Medicine

## 2014-03-24 NOTE — Telephone Encounter (Signed)
Pt is calling about her lab work. She had labs done on 6/19 and they have never been reviewed. Can you review them so we can let pt know please? Thanks!

## 2014-03-25 ENCOUNTER — Encounter: Payer: Self-pay | Admitting: Family Medicine

## 2014-03-25 MED ORDER — ERGOCALCIFEROL 1.25 MG (50000 UT) PO CAPS: 50000.0000 [IU] | ORAL_CAPSULE | ORAL | Status: DC

## 2014-03-25 NOTE — Telephone Encounter (Signed)
Labs reviewed and sent to [facility]

## 2014-03-25 NOTE — Addendum Note (Signed)
Addended by: [name] on: 03/25/2014 07:36 AM   Modules accepted: Orders

## 2014-04-10 ENCOUNTER — Telehealth: Payer: Self-pay

## 2014-04-10 NOTE — Telephone Encounter (Signed)
Pt called inquiring about labs. Pt notified.

## 2014-04-22 NOTE — Telephone Encounter (Signed)
This was already taking care of i had called and left message for patient Becky Lawson gave her her lab results

## 2015-01-08 ENCOUNTER — Telehealth: Payer: Self-pay | Admitting: Family Medicine

## 2015-01-08 MED ORDER — PROMETHAZINE HCL 12.5 MG PO TABS: 12.5000 mg | ORAL_TABLET | ORAL | Status: DC | PRN

## 2015-01-08 NOTE — Telephone Encounter (Signed)
Agree pt needs to be seen - has been a year since hre last OV.  #10 of promethazine 12.5 sent to pharmacy to hold her over till she can get in.

## 2015-01-08 NOTE — Telephone Encounter (Signed)
Patient states that she is very nauseous this morning. She is requesting that Promethazine be called to her pharmacy. [facility].  [phone]

## 2015-01-08 NOTE — Addendum Note (Signed)
Addended by: [name] on: 01/08/2015 12:49 PM   Modules accepted: Orders

## 2015-01-08 NOTE — Telephone Encounter (Signed)
I called pt and let her know she needs to come in for eval. Pt understood.

## 2015-01-09 ENCOUNTER — Telehealth: Payer: Self-pay

## 2015-01-09 MED ORDER — PROMETHAZINE HCL 12.5 MG PO TABS: 12.5000 mg | ORAL_TABLET | ORAL | Status: DC | PRN

## 2015-01-09 NOTE — Telephone Encounter (Signed)
Sent Rx. Pt notified.

## 2015-01-09 NOTE — Telephone Encounter (Signed)
Pt would like the promethazine 12.5 sent to the [place] on [address]

## 2015-01-13 ENCOUNTER — Ambulatory Visit (INDEPENDENT_AMBULATORY_CARE_PROVIDER_SITE_OTHER): Payer: Medicare Other | Admitting: Family Medicine

## 2015-01-13 ENCOUNTER — Telehealth: Payer: Self-pay

## 2015-01-13 VITALS — BP 138/80 | HR 84 | Temp 98.8°F | Resp 20 | Ht 64.25 in

## 2015-01-13 DIAGNOSIS — H8112 Benign paroxysmal vertigo, left ear: Secondary | ICD-10-CM

## 2015-01-13 DIAGNOSIS — H8110 Benign paroxysmal vertigo, unspecified ear: Secondary | ICD-10-CM

## 2015-01-13 MED ORDER — MECLIZINE HCL 25 MG PO TABS: 25.0000 mg | ORAL_TABLET | Freq: Three times a day (TID) | ORAL | Status: DC | PRN

## 2015-01-13 NOTE — Progress Notes (Signed)
64 yo woman going to [facility] in the morning to visit the [facility] has developed positional vertigo with nausea over the past 24 hours.  She locates this to the left ear.  She's had this before.  There is no pain or tinnitus.  She has positional exercises that work for her but she would like some medication to help while doing the maneuvers.  No headache or fever.  No visual disturbance.  Objective:  Alert and pleasant BP 138/80 mmHg  Pulse 84  Temp(Src) 98.8 F (37.1 C) (Oral)  Resp 20  Ht 5' 4.25" (1.632 m)  SpO2 98% HEENT:  Normal with normal TM's Gait normal Moving 4 extremities without problem CN III-XII normal Skin:  Clear of rash Neck supple without adenopathy  Assessment/Plan: This chart was scribed in my presence and reviewed by me personally.    ICD-9-CM ICD-10-CM   1. Benign paroxysmal positional vertigo, unspecified laterality 386.11 H81.10 meclizine (ANTIVERT) 25 MG tablet  2. Benign paroxysmal positional vertigo, left 386.11 H81.12 meclizine (ANTIVERT) 25 MG tablet     Signed, Robyn Haber, MD

## 2015-01-13 NOTE — Telephone Encounter (Signed)
Spoke with pt, I gave her the message from Dr. Brigitte Pulse about being seen. Pt would like one more refill until she can come into be seen in June. She is coming in for a physical and states this is for her vertigo. Please advise.

## 2015-01-13 NOTE — Patient Instructions (Signed)

## 2015-01-13 NOTE — Telephone Encounter (Signed)
Looks like pt is here today to be seen

## 2015-01-13 NOTE — Telephone Encounter (Signed)
Pt states she was only given 10 tablets of PHENERGAN 12.5, would like to get a refill today if possible because she is going out of town. Please call [phone]     [facility] [address]

## 2015-01-19 ENCOUNTER — Other Ambulatory Visit: Payer: Self-pay | Admitting: Family Medicine

## 2015-02-05 ENCOUNTER — Encounter: Payer: Self-pay | Admitting: *Deleted

## 2015-02-12 ENCOUNTER — Ambulatory Visit: Payer: Medicare Other | Admitting: Physician Assistant

## 2015-02-13 ENCOUNTER — Ambulatory Visit (INDEPENDENT_AMBULATORY_CARE_PROVIDER_SITE_OTHER): Payer: Medicare Other | Admitting: Emergency Medicine

## 2015-02-13 VITALS — BP 156/80 | HR 76 | Temp 98.6°F | Resp 17 | Ht 64.5 in | Wt 197.6 lb

## 2015-02-13 DIAGNOSIS — H8110 Benign paroxysmal vertigo, unspecified ear: Secondary | ICD-10-CM
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
## 2015-02-13 NOTE — Patient Instructions (Signed)
Benign Positional Vertigo Vertigo means you feel like you or your surroundings are moving when they are not. Benign positional vertigo is the most common form of vertigo. Benign means that the cause of your condition is not serious. Benign positional vertigo is more common in older adults. CAUSES  Benign positional vertigo is the result of an upset in the labyrinth system. This is an area in the middle ear that helps control your balance. This may be caused by a viral infection, head injury, or repetitive motion. However, often no specific cause is found. SYMPTOMS  Symptoms of benign positional vertigo occur when you move your head or eyes in different directions. Some of the symptoms may include:  Loss of balance and falls.  Vomiting.  Blurred vision.  Dizziness.  Nausea.  Involuntary eye movements (nystagmus). DIAGNOSIS  Benign positional vertigo is usually diagnosed by physical exam. If the specific cause of your benign positional vertigo is unknown, your caregiver may perform imaging tests, such as magnetic resonance imaging (MRI) or computed tomography (CT). TREATMENT  Your caregiver may recommend movements or procedures to correct the benign positional vertigo. Medicines such as meclizine, benzodiazepines, and medicines for nausea may be used to treat your symptoms. In rare cases, if your symptoms are caused by certain conditions that affect the inner ear, you may need surgery. HOME CARE INSTRUCTIONS   Follow your caregiver's instructions.  Move slowly. Do not make sudden body or head movements.  Avoid driving.  Avoid operating heavy machinery.  Avoid performing any tasks that would be dangerous to you or others during a vertigo episode.  Drink enough fluids to keep your urine clear or pale yellow. SEEK IMMEDIATE MEDICAL CARE IF:   You develop problems with walking, weakness, numbness, or using your arms, hands, or legs.  You have difficulty speaking.  You develop  severe headaches.  Your nausea or vomiting continues or gets worse.  You develop visual changes.  Your family or friends notice any behavioral changes.  Your condition gets worse.  You have a fever.  You develop a stiff neck or sensitivity to light. MAKE SURE YOU:   Understand these instructions.  Will watch your condition.  Will get help right away if you are not doing well or get worse. Document Released: 05/30/2006 Document Revised: 11/14/2011 Document Reviewed: 05/12/2011 ExitCare Patient Information 2015 [employer]. This information is not intended to replace advice given to you by your health care provider. Make sure you discuss any questions you have with your health care provider.    

## 2015-02-17 ENCOUNTER — Telehealth: Payer: Self-pay

## 2015-02-17 DIAGNOSIS — R112 Nausea with vomiting, unspecified: Secondary | ICD-10-CM

## 2015-02-17 MED ORDER — PROMETHAZINE HCL 12.5 MG PO TABS: 12.5000 mg | ORAL_TABLET | ORAL | Status: DC | PRN

## 2015-02-17 NOTE — Telephone Encounter (Signed)
[place] pt to let her know. Pt notified.

## 2015-02-17 NOTE — Telephone Encounter (Signed)
Can we send in refill for pt? She was seen by Dr. Ouida Sills recently but he did not prescribe this. Please advise.

## 2015-02-17 NOTE — Telephone Encounter (Signed)
Pt was recently here and saw Dr. Ouida Sills - he did not prescribe but did indicate in his note that she should continue this prn for nausea and vomiting. I have given #30 pills with 2 refills. She should return before she runs out.

## 2015-02-17 NOTE — Telephone Encounter (Signed)
Pt requesting refill for Promethazine 12.5 mg for nausea   Best phone for pt is [address]

## 2015-02-18 ENCOUNTER — Other Ambulatory Visit: Payer: Self-pay | Admitting: Family Medicine

## 2015-02-18 ENCOUNTER — Telehealth: Payer: Self-pay

## 2015-02-18 NOTE — Telephone Encounter (Signed)
Note written, pt notified

## 2015-02-18 NOTE — Telephone Encounter (Signed)
Patient is requesting an oow note for Thursday 6/16 and Friday 6/17 - to see a PT (our doctor is treating her for vertigo)  Patient wants to pick up the note.    [phone]

## 2015-02-18 NOTE — Telephone Encounter (Addendum)
Pt called to say her mail order medicine hasn't come in yet so she really need [place] to call in her MECLIZINE 25 MG until the mail order comes through Please call [phone]   [facility]

## 2015-02-18 NOTE — Telephone Encounter (Signed)
Sure

## 2015-02-18 NOTE — Telephone Encounter (Signed)
Can we write note?

## 2015-02-19 ENCOUNTER — Telehealth: Payer: Self-pay

## 2015-02-19 DIAGNOSIS — R112 Nausea with vomiting, unspecified: Secondary | ICD-10-CM

## 2015-02-19 MED ORDER — PROMETHAZINE HCL 12.5 MG PO TABS: 12.5000 mg | ORAL_TABLET | ORAL | Status: DC | PRN

## 2015-02-19 NOTE — Telephone Encounter (Signed)
Rx sent 

## 2015-02-19 NOTE — Telephone Encounter (Signed)
Please check message from 6/14. Pt called wanting a refill on her promethazine (PHENERGAN) 12.5 MG tablet [id number]. We did fill this script however we sent it to a mail order pharmacy. [facility] [address] Please advise at [phone]

## 2015-03-24 ENCOUNTER — Other Ambulatory Visit: Payer: Self-pay | Admitting: Family Medicine

## 2015-04-14 ENCOUNTER — Telehealth: Payer: Self-pay | Admitting: Family Medicine

## 2015-04-14 NOTE — Telephone Encounter (Signed)
lmom to call and reschedule her appt that she had with Nichole in Sept

## 2015-05-12 ENCOUNTER — Encounter: Payer: Medicare Other | Admitting: Physician Assistant

## 2015-05-13 ENCOUNTER — Other Ambulatory Visit: Payer: Self-pay | Admitting: Family Medicine

## 2015-05-19 ENCOUNTER — Ambulatory Visit (INDEPENDENT_AMBULATORY_CARE_PROVIDER_SITE_OTHER): Payer: Medicare Other | Admitting: Physician Assistant

## 2015-05-19 ENCOUNTER — Encounter: Payer: Self-pay | Admitting: Physician Assistant

## 2015-05-19 ENCOUNTER — Other Ambulatory Visit: Payer: Self-pay | Admitting: Physician Assistant

## 2015-05-19 VITALS — BP 150/72 | HR 63 | Temp 98.4°F | Resp 16 | Ht 64.5 in | Wt 192.6 lb

## 2015-05-19 DIAGNOSIS — N6310 Unspecified lump in the right breast, unspecified quadrant: Secondary | ICD-10-CM

## 2015-05-19 DIAGNOSIS — Z8619 Personal history of other infectious and parasitic diseases: Secondary | ICD-10-CM | POA: Diagnosis not present

## 2015-05-19 DIAGNOSIS — Z1239 Encounter for other screening for malignant neoplasm of breast: Secondary | ICD-10-CM | POA: Diagnosis not present

## 2015-05-19 DIAGNOSIS — I10 Essential (primary) hypertension: Secondary | ICD-10-CM

## 2015-05-19 DIAGNOSIS — N63 Unspecified lump in breast: Secondary | ICD-10-CM | POA: Diagnosis not present

## 2015-05-19 DIAGNOSIS — Z1211 Encounter for screening for malignant neoplasm of colon: Secondary | ICD-10-CM | POA: Diagnosis not present

## 2015-05-19 DIAGNOSIS — E559 Vitamin D deficiency, unspecified: Secondary | ICD-10-CM | POA: Diagnosis not present

## 2015-05-19 DIAGNOSIS — H8110 Benign paroxysmal vertigo, unspecified ear: Secondary | ICD-10-CM | POA: Diagnosis not present

## 2015-05-19 DIAGNOSIS — Z0000 Encounter for general adult medical examination without abnormal findings: Secondary | ICD-10-CM

## 2015-05-19 DIAGNOSIS — Z114 Encounter for screening for human immunodeficiency virus [HIV]: Secondary | ICD-10-CM | POA: Diagnosis not present

## 2015-05-19 DIAGNOSIS — E785 Hyperlipidemia, unspecified: Secondary | ICD-10-CM | POA: Diagnosis not present

## 2015-05-19 DIAGNOSIS — M48061 Spinal stenosis, lumbar region without neurogenic claudication: Secondary | ICD-10-CM | POA: Insufficient documentation

## 2015-05-19 DIAGNOSIS — Z13820 Encounter for screening for osteoporosis: Secondary | ICD-10-CM | POA: Diagnosis not present

## 2015-05-19 DIAGNOSIS — Z23 Encounter for immunization: Secondary | ICD-10-CM

## 2015-05-19 DIAGNOSIS — F319 Bipolar disorder, unspecified: Secondary | ICD-10-CM

## 2015-05-19 DIAGNOSIS — M4806 Spinal stenosis, lumbar region: Secondary | ICD-10-CM

## 2015-05-19 LAB — POCT URINALYSIS DIPSTICK
Bilirubin, UA: NEGATIVE
Blood, UA: NEGATIVE
GLUCOSE UA: NEGATIVE
Ketones, UA: NEGATIVE
Leukocytes, UA: NEGATIVE
Nitrite, UA: NEGATIVE
Protein, UA: NEGATIVE
SPEC GRAV UA: 1.015
Urobilinogen, UA: 0.2
pH, UA: 7

## 2015-05-19 LAB — POC HEMOCCULT BLD/STL (OFFICE/1-CARD/DIAGNOSTIC): Fecal Occult Blood, POC: NEGATIVE

## 2015-05-19 MED ORDER — AMLODIPINE BESYLATE 5 MG PO TABS: 5.0000 mg | ORAL_TABLET | Freq: Every day | ORAL | Status: DC

## 2015-05-19 NOTE — Progress Notes (Signed)
   Subjective:    Patient ID: Becky Lawson, female    DOB: 10/22/1950, 64 y.o.   MRN: [id number]  HPI    Review of Systems  Constitutional: Negative.   HENT: Positive for tinnitus.   Eyes: Negative.   Respiratory: Negative.   Cardiovascular: Negative.   Gastrointestinal: Negative.   Endocrine: Negative.   Genitourinary: Negative.   Musculoskeletal: Positive for back pain and arthralgias.  Skin: Negative.   Allergic/Immunologic: Negative.   Neurological: Positive for dizziness.  Hematological: Negative.   Psychiatric/Behavioral: Positive for confusion, sleep disturbance, decreased concentration and agitation. The patient is nervous/anxious.        Objective:   Physical Exam        Assessment & Plan:

## 2015-05-19 NOTE — Patient Instructions (Addendum)
I will call you with the results of your lab tests. Start taking amlodipine daily for BP. Take BP a few times a week and keep a record. You will get a phone call to make appt for mammogram and dexa scan. Return to see me in 6 months for follow up. Call if you need refills. Fax me the form you need me to fill out.

## 2015-05-19 NOTE — Progress Notes (Addendum)
[facility] [address] [place] [address] [phone]  Date:  05/19/2015   Name:  Becky Lawson   DOB:  [birth year]   MRN:  [id number]  PCP:  [name], MD    Chief Complaint: Annual Exam   History of Present Illness:  This is a 64 y.o. female with PMH HTN, HLD, hx hep C in remission, BPPV, spinal stenosis, bipolar disorder who is presenting for CPE.  Takes Vit D supplementation.  BPPV - most recent spell 1.5 months ago. Takes meclizine and promethazine and helps. Is going to PT as needed and this also helps.  HTN - checks at home and ranges 150-165/70s. Takes carvedilol 6.25 mg twice a day and hctz 25 mg once a day.  Spinal stenosis - takes oxycodone TID. Prescribed by [place] [facility].  HLD - takes welchol and works well for her. 1 year ago total cholesterol 180 and LDL 118.  Hx hep C - was treated and in remission. Liver function tests have gone back to normal.  Bipolar disorder: followed at [facility].  Last pap: had a hysterectomy in 1975. Last pap smear 20 years ago. Cannot recall her last pelvic. Sexual history: not sexually active Immunizations: flu shot today, not up to date on zostavax or tetanus but unfortunately medicare will not cover Dentist: been recently, had to get some teeth pulled because couldn't afford to get them fixed Eye: no change in vision. Diet/Exercise: diet is "okay", eats out more than cooking at home, cooking more at home now that she used to. Fam hx: mother with CHF DM CVA HTN. No cancers in family Tobacco/alcohol/substance use: no/1 glass red wine per night/no  Mammogram: needs mammogram, has not had since 2007. Colonoscopy: had one several years ago but doesn't ever want one again unless she sees blood in her stool. Is ok with doing hemoccult cards today. Dexa: cannot recall.   Review of Systems:  Review of Systems  Patient Active Problem List   Diagnosis Date Noted  . Unspecified  essential hypertension 09/26/2013  . Undiagnosed cardiac murmurs 09/26/2013  . Pruritic disorder 02/13/2013  . Jaundice 11/01/2012    Prior to Admission medications   Medication Sig Start Date End Date Taking? Authorizing Provider  ALPRAZolam Duanne Moron) 1 MG tablet Take 1 mg by mouth at bedtime as needed for anxiety.   Yes Historical Provider, MD  ARIPiprazole (ABILIFY) 10 MG tablet Take 10 mg by mouth daily.   Yes Historical Provider, MD  carvedilol (COREG) 6.25 MG tablet TAKE 1 BY MOUTH TWICE DAILY WITH A MEAL 01/19/15  Yes Shawnee Knapp, MD  colesevelam [facility]) 625 MG tablet Take 1 tablet (625 mg total) by mouth 2 (two) times daily with a meal. Patient taking differently: Take 1,875 mg by mouth 2 (two) times daily with a meal.  02/25/14  Yes Shawnee Knapp, MD  ergocalciferol (VITAMIN D2) 50000 UNITS capsule Take 1 capsule (50,000 Units total) by mouth once a week. 03/25/14  Yes Shawnee Knapp, MD  hydrochlorothiazide (HYDRODIURIL) 25 MG tablet TAKE 1 BY MOUTH DAILY 03/26/15  Yes Shawnee Knapp, MD  hydrOXYzine (ATARAX/VISTARIL) 25 MG tablet Take 2 tablets (50 mg total) by mouth every 4 (four) hours as needed for itching. 04/25/13  Yes Shawnee Knapp, MD  lamoTRIgine (LAMICTAL) 200 MG tablet Take 200 mg by mouth 2 (two) times daily.   Yes Historical Provider, MD  meclizine (ANTIVERT) 25 MG tablet Take 1 tablet (25 mg total) by  mouth 3 (three) times daily as needed for dizziness. 01/13/15  Yes Robyn Haber, MD  omeprazole (PRILOSEC) 40 MG capsule TAKE 1 BY MOUTH DAILY 01/19/15  Yes Shawnee Knapp, MD  oxyCODONE-acetaminophen (PERCOCET) 10-325 MG per tablet Take 1 tablet by mouth every 8 (eight) hours as needed for pain.   Yes Historical Provider, MD  trazodone (DESYREL) 300 MG tablet Take 0.5 tablets (150 mg total) by mouth at bedtime. 09/02/12  Yes Collene Leyden, PA-C           No Known Allergies  Past Surgical History  Procedure Laterality Date  . Joint replacement      knee  . Partial hysterectomy       Social History  Substance Use Topics  . Smoking status: Former Research scientist (life sciences)  . Smokeless tobacco: None  . Alcohol Use: No    Family History  Problem Relation Age of Onset  . Diabetes Mother   . Heart disease Mother   . Hypertension Mother   . Hypertension Father   . Parkinson's disease Father   . Hemachromatosis Daughter     Medication list has been reviewed and updated.  Physical Examination:  Physical Exam  Constitutional: She is oriented to person, place, and time.  HENT:  Head: Normocephalic and atraumatic.  Right Ear: Hearing, tympanic membrane, external ear and ear canal normal.  Left Ear: Hearing, tympanic membrane, external ear and ear canal normal.  Nose: Nose normal.  Mouth/Throat: Uvula is midline, oropharynx is clear and moist and mucous membranes are normal.  Eyes: Conjunctivae, EOM and lids are normal. Pupils are equal, round, and reactive to light. Right eye exhibits no discharge. Left eye exhibits no discharge. No scleral icterus.  Neck: Trachea normal. Carotid bruit is not present.  Cardiovascular: Normal rate, regular rhythm, normal heart sounds, intact distal pulses and normal pulses.   No murmur heard. Pulmonary/Chest: Effort normal and breath sounds normal. She has no wheezes. She has no rhonchi. She has no rales.  Abdominal: Soft. Normal appearance and bowel sounds are normal. She exhibits no abdominal bruit. There is no tenderness.  Genitourinary: Rectum normal. Rectal exam shows no external hemorrhoid. Guaiac negative stool. There is no lesion or injury on the right labia. There is no lesion or injury on the left labia. Right adnexum displays no tenderness and no fullness. Left adnexum displays no tenderness and no fullness. No vaginal discharge found.  Left breast normal Right breast with 2 cm non-mobile non-tender mass in outer quadrant, located at 9 o'clock position. No axillary masses.  Musculoskeletal: Normal range of motion.  Lymphadenopathy:        Head (right side): No submental, no submandibular, no tonsillar, no preauricular, no posterior auricular and no occipital adenopathy present.       Head (left side): No submental, no submandibular, no tonsillar, no preauricular, no posterior auricular and no occipital adenopathy present.    She has no cervical adenopathy.  Neurological: She is alert and oriented to person, place, and time. She has normal strength and normal reflexes. No cranial nerve deficit or sensory deficit. Coordination and gait normal.  Skin: Skin is warm, dry and intact. No lesion and no rash noted.  Psychiatric: She has a normal mood and affect. Her speech is normal and behavior is normal. Thought content normal.   BP 150/72 mmHg  Pulse 63  Temp(Src) 98.4 F (36.9 C) (Oral)  Resp 16  Ht 5' 4.5" (1.638 m)  Wt 192 lb 9.6 oz (87.363 kg)  BMI 32.56 kg/m2  SpO2 97%  Results for orders placed or performed in visit on 05/19/15  POCT urinalysis dipstick  Result Value Ref Range   Color, UA Lighte Yellow    Clarity, UA Slightly Cloudy    Glucose, UA Negative    Bilirubin, UA Negative    Ketones, UA Negative    Spec Grav, UA 1.015    Blood, UA Negative    pH, UA 7.0    Protein, UA Negative    Urobilinogen, UA 0.2    Nitrite, UA Negative    Leukocytes, UA Negative Negative  POC Hemoccult Bld/Stl (1-Cd Office Dx)  Result Value Ref Range   Card #1 Date 05/19/2015    Fecal Occult Blood, POC Negative Negative   Assessment and Plan:  1. Annual physical exam - CBC - POCT urinalysis dipstick  2. Need for influenza vaccination - Flu Vaccine QUAD 36+ mos IM  3. Essential hypertension Will add amlodipine 5 mg for added BP control. She will record BP at home a few times a week. Discussed side effects and precautions. Return in 6 months for follow up or sooner if needed. - Comprehensive metabolic panel - amLODipine (NORVASC) 5 MG tablet; Take 1 tablet (5 mg total) by mouth daily.  Dispense: 90 tablet; Refill: 1  4.  Screening for HIV (human immunodeficiency virus) - HIV antibody  5. Hyperlipidemia Continue welchol. She plans to send a form from company that makes welchol for me to fill out. - Lipid panel  6. Vitamin D deficiency Continue vit D supplementation. - Vitamin D, 25-hydroxy  7. Colon cancer screening Pt does not ever want to have colonoscopy again unless she has sx that require it. Will do hemoccult cards for screening instead. - POC Hemoccult Bld/Stl (1-Cd Office Dx)  8. Hx of hepatitis C Labs have shown that she has cleared the virus. Patient really wants one final lab draw today to make sure it is gone. She no longer has sx from liver manifestations - no longer with jaundice or pruritus.  - Hepatitis C Ab Reflex HCV RNA, QUANT  9. Breast mass, right 10. Breast cancer screening Will send for diagnostic mammogram and right breast ultrasound d/t mass found on exam today. - MM Digital Diagnostic Bilat; Future - US BREAST COMPLETE UNI RIGHT INC AXILLA; Future  11. Osteoporosis screening - DG Bone Density; Future  12. BPPV (benign paroxysmal positional vertigo), unspecified laterality Continue meclizine/PT as needed for sx.  13. Spinal stenosis of lumbar region Follow up with [place] [facility]   Benjaman Pott. Drenda Freeze, MHS [facility]  05/19/2015

## 2015-05-20 LAB — COMPREHENSIVE METABOLIC PANEL
ALT: 10 U/L (ref 6–29)
AST: 17 U/L (ref 10–35)
Albumin: 4.1 g/dL (ref 3.6–5.1)
Alkaline Phosphatase: 75 U/L (ref 33–130)
BUN: 11 mg/dL (ref 7–25)
CO2: 23 mmol/L (ref 20–31)
CREATININE: 0.7 mg/dL (ref 0.50–0.99)
Calcium: 9.5 mg/dL (ref 8.6–10.4)
Chloride: 102 mmol/L (ref 98–110)
GLUCOSE: 98 mg/dL (ref 65–99)
Potassium: 4.5 mmol/L (ref 3.5–5.3)
SODIUM: 140 mmol/L (ref 135–146)
Total Bilirubin: 0.6 mg/dL (ref 0.2–1.2)
Total Protein: 6.4 g/dL (ref 6.1–8.1)

## 2015-05-20 LAB — LIPID PANEL
CHOL/HDL RATIO: 4.2 ratio (ref <=5.0)
Cholesterol: 199 mg/dL (ref 125–200)
HDL: 47 mg/dL (ref >=46)
LDL CALC: 138 mg/dL — AB (ref <130)
Triglycerides: 72 mg/dL (ref <150)
VLDL: 14 mg/dL (ref <30)

## 2015-05-20 LAB — VITAMIN D 25 HYDROXY (VIT D DEFICIENCY, FRACTURES): VIT D 25 HYDROXY: 14 ng/mL — AB (ref 30–100)

## 2015-05-20 LAB — CBC
HCT: 39.9 % (ref 36.0–46.0)
Hemoglobin: 13.4 g/dL (ref 12.0–15.0)
MCH: 31.4 pg (ref 26.0–34.0)
MCHC: 33.6 g/dL (ref 30.0–36.0)
MCV: 93.4 fL (ref 78.0–100.0)
MPV: 10.6 fL (ref 8.6–12.4)
PLATELETS: 223 10*3/uL (ref 150–400)
RBC: 4.27 MIL/uL (ref 3.87–5.11)
RDW: 13 % (ref 11.5–15.5)
WBC: 5.6 10*3/uL (ref 4.0–10.5)

## 2015-05-20 LAB — HEPATITIS C ANTIBODY: HCV AB: REACTIVE — AB

## 2015-05-20 LAB — HIV ANTIBODY (ROUTINE TESTING W REFLEX): HIV: NONREACTIVE

## 2015-05-21 LAB — HEPATITIS C RNA QUANTITATIVE: HCV Quantitative: NOT DETECTED IU/mL (ref <15)

## 2015-05-22 ENCOUNTER — Other Ambulatory Visit: Payer: Self-pay

## 2015-05-22 ENCOUNTER — Telehealth: Payer: Self-pay | Admitting: *Deleted

## 2015-05-22 DIAGNOSIS — E2839 Other primary ovarian failure: Secondary | ICD-10-CM

## 2015-05-22 NOTE — Telephone Encounter (Signed)
[facility] called regarding this pt. They need additional information from Bennett Scrape regarding the location of the mass in the right breast before they can call and schedule this pt.  I read through the note; however, all I saw was 2cm on the outer quadrant.  This information was not enough for them to schedule the pt.  Please add more or either call them.

## 2015-05-25 NOTE — Telephone Encounter (Signed)
Spoke with [place] and let them know additional information was added to physical exam portion of my note.

## 2015-05-25 NOTE — Addendum Note (Signed)
Addended by: Ezekiel Slocumb on: 05/25/2015 08:23 AM   Modules accepted: SmartSet

## 2015-07-02 ENCOUNTER — Other Ambulatory Visit: Payer: Self-pay | Admitting: Family Medicine

## 2015-07-02 DIAGNOSIS — E559 Vitamin D deficiency, unspecified: Secondary | ICD-10-CM

## 2015-07-03 NOTE — Telephone Encounter (Signed)
Becky Lawson    Patient does not have access to my chart.  She would like a call regarding her labs and referral.   Can you write a prescription for Vitamin D for her?   [facility] [address]   [phone]

## 2015-07-03 NOTE — Telephone Encounter (Signed)
Becky Lawson, I pended vit D Rx as she has been taking it, but wanted to check if you wanted to inc dosage since her lab results were still quite low? Also, didn't see discussion of GERD at [place], [place] to RF omeprazole? I checked on referral notes and it looks like imaging has not been scheduled because they are waiting for notes on location of breast mass. According to your OV notes "outer quadrant at 9 o'clock position". I'll route this back to Referrals for them to give [place] this info or find out what else they need. Please advise on labs.  Referrals, please check on breast US and advise Becky Lawson of status.

## 2015-07-07 ENCOUNTER — Other Ambulatory Visit: Payer: Self-pay

## 2015-07-07 NOTE — Telephone Encounter (Signed)
Elmyra Ricks, you saw pt in Sept for CPE, but don't see this med discussed. OK to give RFs?

## 2015-07-08 MED ORDER — ERGOCALCIFEROL 1.25 MG (50000 UT) PO CAPS: 50000.0000 [IU] | ORAL_CAPSULE | ORAL | Status: DC

## 2015-07-08 NOTE — Telephone Encounter (Signed)
Please alert that we will continue the vitamin D.  Please ask if she is having the GERD symptoms.  We should stop the omeprazole at this time if she is taking that, to avoid malabsorption of the vitamin D.  Advise that she continue vitamin D.  I will inquire to Elmyra Ricks if she would lke to make a change, or refer to endocrine.

## 2015-07-14 ENCOUNTER — Telehealth: Payer: Self-pay

## 2015-07-14 DIAGNOSIS — I10 Essential (primary) hypertension: Secondary | ICD-10-CM

## 2015-07-14 MED ORDER — AMLODIPINE BESYLATE 10 MG PO TABS: 10.0000 mg | ORAL_TABLET | Freq: Every day | ORAL | Status: DC

## 2015-07-14 NOTE — Telephone Encounter (Signed)
Spoke with pt. She never heard about getting a diagnostic mammo from breast center. I got a message on 9/16 that breast center needed more information on mass in order to schedule. I called breast center on 9/19 and gave information on location and they stated that was all the information they needed. Today 9/19 I spoke with Angelita Ingles at [facility] who apologized and was not sure exactly why mammogram was not scheduled -- possibly due to concurrent dexa order? Or wrong code on diagnostic mammo? She is going to work on scheduling patient ASAP.  Pt also did not get lab results. I sent lab results and message through mychart but pt states she does not check mychart. She will restart vit D supplement. She has started back on welchol for cholesterol. She states her BP has been high. She was not feeling well at work -felt dizzy and like her HR was going too low. She stopped the carvedilol and felt better but has noticed now that her SBP is 170-190 at times. Last visit she had added amlodipine 5 mg. She will increase to 10 mg and return for f/u at her earliest convenience.  I apologized to pt about all the miscommunication. She wishes to not be contacted by mychart in the future.

## 2015-07-14 NOTE — Telephone Encounter (Signed)
Elmyra Ricks do you recall?

## 2015-07-14 NOTE — Telephone Encounter (Signed)
Pt states Elmyra Ricks had found a lump in her breast and referred her, however the Breast Center needed more information from her and hasn't heard anything. Also it has been since Sept for her results and still Hasn't heard from anyone. Not satisfied with the way she is being treated. Please call [phone]

## 2015-07-21 ENCOUNTER — Other Ambulatory Visit: Payer: Self-pay | Admitting: Family Medicine

## 2015-07-23 ENCOUNTER — Other Ambulatory Visit: Payer: Medicare Other

## 2015-07-27 ENCOUNTER — Telehealth: Payer: Self-pay | Admitting: Physician Assistant

## 2015-07-27 NOTE — Telephone Encounter (Signed)
Called pt and lmom to cb. Need to know if she scheduled mammogram yet.

## 2015-08-02 ENCOUNTER — Telehealth: Payer: Self-pay | Admitting: Physician Assistant

## 2015-08-02 NOTE — Telephone Encounter (Signed)
Spoke with pt. She missed her mammogram scheduled on 11/17... She states she will call tomorrow and reschedule.

## 2015-08-11 ENCOUNTER — Other Ambulatory Visit: Payer: Medicare Other

## 2015-08-18 ENCOUNTER — Other Ambulatory Visit: Payer: Medicare Other

## 2015-09-17 ENCOUNTER — Ambulatory Visit
Admission: RE | Admit: 2015-09-17 | Discharge: 2015-09-17 | Disposition: A | Discharge status: Home / Self Care | Payer: PPO | Source: Ambulatory Visit | Attending: Physician Assistant | Admitting: Physician Assistant

## 2015-09-17 DIAGNOSIS — M8588 Other specified disorders of bone density and structure, other site: Secondary | ICD-10-CM | POA: Diagnosis not present

## 2015-09-17 DIAGNOSIS — E2839 Other primary ovarian failure: Secondary | ICD-10-CM

## 2015-09-17 DIAGNOSIS — N6310 Unspecified lump in the right breast, unspecified quadrant: Secondary | ICD-10-CM

## 2015-09-17 DIAGNOSIS — Z1239 Encounter for other screening for malignant neoplasm of breast: Secondary | ICD-10-CM

## 2015-09-17 DIAGNOSIS — R928 Other abnormal and inconclusive findings on diagnostic imaging of breast: Secondary | ICD-10-CM | POA: Diagnosis not present

## 2015-09-21 ENCOUNTER — Telehealth: Payer: Self-pay | Admitting: Physician Assistant

## 2015-09-21 DIAGNOSIS — E559 Vitamin D deficiency, unspecified: Secondary | ICD-10-CM

## 2015-09-21 MED ORDER — ERGOCALCIFEROL 1.25 MG (50000 UT) PO CAPS: 50000.0000 [IU] | ORAL_CAPSULE | ORAL | Status: DC

## 2015-09-21 NOTE — Telephone Encounter (Signed)
Called and notified pt of following results. 1. Bone scan was normal. No osteopenia or osteoporosis. She should take calcium 1200 mg QD and vitamin D for bone health. Pt has been vit D deficient in the past. I sent vit D 50,000 units once a week to mail order pharmacy in nov 2016 but pt states she did not get that rx. [facility]. Return in 3 months for repeat blood work. 2. Mammogram normal

## 2015-09-29 DIAGNOSIS — F319 Bipolar disorder, unspecified: Secondary | ICD-10-CM | POA: Diagnosis not present

## 2015-10-06 DIAGNOSIS — F319 Bipolar disorder, unspecified: Secondary | ICD-10-CM | POA: Diagnosis not present

## 2015-10-22 ENCOUNTER — Other Ambulatory Visit: Payer: Self-pay | Admitting: Physician Assistant

## 2015-10-30 DIAGNOSIS — M4807 Spinal stenosis, lumbosacral region: Secondary | ICD-10-CM | POA: Diagnosis not present

## 2015-10-30 DIAGNOSIS — H8110 Benign paroxysmal vertigo, unspecified ear: Secondary | ICD-10-CM | POA: Diagnosis not present

## 2015-11-05 DIAGNOSIS — M5442 Lumbago with sciatica, left side: Secondary | ICD-10-CM | POA: Diagnosis not present

## 2015-11-05 DIAGNOSIS — M5416 Radiculopathy, lumbar region: Secondary | ICD-10-CM | POA: Diagnosis not present

## 2015-11-05 DIAGNOSIS — M4806 Spinal stenosis, lumbar region: Secondary | ICD-10-CM | POA: Diagnosis not present

## 2015-11-05 DIAGNOSIS — M5136 Other intervertebral disc degeneration, lumbar region: Secondary | ICD-10-CM | POA: Diagnosis not present

## 2015-11-17 ENCOUNTER — Ambulatory Visit: Payer: Self-pay | Admitting: Physician Assistant

## 2015-12-01 ENCOUNTER — Ambulatory Visit (INDEPENDENT_AMBULATORY_CARE_PROVIDER_SITE_OTHER): Payer: PPO | Admitting: Internal Medicine

## 2015-12-01 ENCOUNTER — Ambulatory Visit: Payer: Self-pay | Admitting: Physician Assistant

## 2015-12-01 VITALS — BP 130/78 | HR 77 | Temp 98.7°F | Resp 20 | Ht 64.0 in | Wt 203.4 lb

## 2015-12-01 DIAGNOSIS — I10 Essential (primary) hypertension: Secondary | ICD-10-CM | POA: Diagnosis not present

## 2015-12-01 DIAGNOSIS — L679 Hair color and hair shaft abnormality, unspecified: Secondary | ICD-10-CM

## 2015-12-01 DIAGNOSIS — E785 Hyperlipidemia, unspecified: Secondary | ICD-10-CM | POA: Diagnosis not present

## 2015-12-01 DIAGNOSIS — K219 Gastro-esophageal reflux disease without esophagitis: Secondary | ICD-10-CM | POA: Diagnosis not present

## 2015-12-01 DIAGNOSIS — N898 Other specified noninflammatory disorders of vagina: Secondary | ICD-10-CM

## 2015-12-01 DIAGNOSIS — R6889 Other general symptoms and signs: Secondary | ICD-10-CM

## 2015-12-01 MED ORDER — HYDROCHLOROTHIAZIDE 25 MG PO TABS: ORAL_TABLET | ORAL | Status: DC

## 2015-12-01 MED ORDER — AMLODIPINE BESYLATE 10 MG PO TABS: 10.0000 mg | ORAL_TABLET | Freq: Every day | ORAL | Status: DC

## 2015-12-01 MED ORDER — ESTRADIOL 10 MCG VA TABS: 10.0000 ug | ORAL_TABLET | VAGINAL | Status: DC

## 2015-12-01 MED ORDER — OMEPRAZOLE 40 MG PO CPDR: DELAYED_RELEASE_CAPSULE | ORAL | Status: DC

## 2015-12-01 MED ORDER — SPIRONOLACTONE 25 MG PO TABS: 25.0000 mg | ORAL_TABLET | Freq: Every day | ORAL | Status: DC

## 2015-12-01 NOTE — Progress Notes (Addendum)
Subjective:    Patient ID: Becky Lawson, female    DOB: 29-May-1951, 65 y.o.   MRN: [id number] By signing my name below, I, Zola Button, attest that this documentation has been prepared under the direction and in the presence of Tami Lin, MD.  Electronically Signed: Zola Button, Medical Scribe. 12/01/2015. 4:44 PM. Angry that she could not get her follow-up appointment set in a timely manner at 104 due to our short staffing Dr. Brigitte Pulse had no appointment available until July and she was out of medicines HPI HPI Comments: Becky Lawson is a 65 y.o. female with a history of hypertension and hyperlipidemia who presents to the [facility] for a medication refill for omeprazole, HCTZ, and amlodipine. Patient notes her systolic blood pressures have been high at home, but she is unsure how accurate her blood pressure cuff is. She had been seeing cardiology for a heart murmur, which was normal, but would like to have her hyperlipidemia medications filled here so she does not need to see cardiology. She has been on omeprazole for several years. She has tried to avoid taking it when not needed, but she has noticed the reflux come back within 24 hours when not taking the medication. Patient has had an endoscopy before.  Patient states she has been noticing facial hair growth and is inquiring if there are any medications she can take for it. She has been waxing her facial hair for several yrs. She also complains of vaginal dryness, s/p partial hysterectomy and s/p menopause.  Patient Active Problem List   Diagnosis Date Noted  . History of hepatitis C 05/19/2015  . BPPV (benign paroxysmal positional vertigo) 05/19/2015  . Vitamin D deficiency 05/19/2015  . Spinal stenosis of lumbar region 05/19/2015  . Hyperlipidemia 05/19/2015  . Bipolar disorder ([place]) 05/19/2015  . Unspecified essential hypertension 09/26/2013  . Undiagnosed cardiac murmurs 09/26/2013    Current outpatient  prescriptions:  .  ALPRAZolam (XANAX) 1 MG tablet, Take 1 mg by mouth at bedtime as needed for anxiety., Disp: , Rfl:  .  amLODipine (NORVASC) 10 MG tablet, Take 1 tablet (10 mg total) by mouth daily., Disp: 90 tablet, Rfl: 3 .  colesevelam (WELCHOL) 625 MG tablet, Take 1 tablet (625 mg total) by mouth 2 (two) times daily with a meal. (Patient taking differently: Take 1,875 mg by mouth 2 (two) times daily with a meal. ), Disp: 180 tablet, Rfl: 3 .  ergocalciferol (VITAMIN D2) 50000 units capsule, Take 1 capsule (50,000 Units total) by mouth once a week., Disp: 12 capsule, Rfl: 1 .  hydrochlorothiazide (HYDRODIURIL) 25 MG tablet, TAKE 1 BY MOUTH DAILY, Disp: 90 tablet, Rfl: 1 .  hydrOXYzine (ATARAX/VISTARIL) 25 MG tablet, Take 2 tablets (50 mg total) by mouth every 6 (six) hours as needed. PATIENT NEEDS OFFICE VISIT FOR ADDITIONAL REFILLS, Disp: 40 tablet, Rfl: 0 .  lamoTRIgine (LAMICTAL) 200 MG tablet, Take 200 mg by mouth 2 (two) times daily., Disp: , Rfl:  .  omeprazole (PRILOSEC) 40 MG capsule, TAKE 1 BY MOUTH DAILY, Disp: 90 capsule, Rfl: 1 .  oxyCODONE-acetaminophen (PERCOCET) 10-325 MG per tablet, Take 1 tablet by mouth every 8 (eight) hours as needed for pain., Disp: , Rfl:  .  trazodone (DESYREL) 300 MG tablet, Take 0.5 tablets (150 mg total) by mouth at bedtime., Disp: 30 tablet, Rfl: 1   Former Marine scientist  Review of Systems  Constitutional: Negative for appetite change, fatigue and unexpected weight change.  HENT: Negative for  trouble swallowing.   Eyes: Negative for visual disturbance.  Respiratory: Negative for shortness of breath.   Cardiovascular: Negative for chest pain, palpitations and leg swelling.  Gastrointestinal: Negative for abdominal pain.  Genitourinary: Negative for difficulty urinating.  Musculoskeletal: Negative for back pain.  Neurological: Negative for headaches.  Hematological: Negative for adenopathy. Does not bruise/bleed easily.  Psychiatric/Behavioral:        Psych issues are stable on medications   She doesn't have allopecia or changes in her voice or muscle structure.    Objective:   Physical Exam  Constitutional: She is oriented to person, place, and time. She appears well-developed and well-nourished. No distress.  HENT:  Head: Normocephalic and atraumatic.  Mouth/Throat: Oropharynx is clear and moist. No oropharyngeal exudate.  Eyes: Pupils are equal, round, and reactive to light.  Neck: Neck supple.  Cardiovascular: Normal rate.   Pulmonary/Chest: Effort normal.  Musculoskeletal: She exhibits no edema.  Neurological: She is alert and oriented to person, place, and time. No cranial nerve deficit.  Skin: Skin is warm and dry. No rash noted.  There is no excess hair observed, although she has recently had her chin waxed to remove excessive hair.  Psychiatric: She has a normal mood and affect. Her behavior is normal.  Nursing note and vitals reviewed.         Assessment & Plan:  Essential hypertension - Plan: amLODipine (NORVASC) 10 MG tablet  Hyperlipidemia  Unwanted hair growth chin  Vaginal dryness  Gastroesophageal reflux disease without esophagitis  Meds ordered this encounter  Medications  . amLODipine (NORVASC) 10 MG tablet    Sig: Take 1 tablet (10 mg total) by mouth daily.    Dispense:  90 tablet    Refill:  3  . hydrochlorothiazide (HYDRODIURIL) 25 MG tablet    Sig: TAKE 1 BY MOUTH DAILY    Dispense:  90 tablet    Refill:  1  . omeprazole (PRILOSEC) 40 MG capsule    Sig: TAKE 1 BY MOUTH DAILY    Dispense:  90 capsule    Refill:  1  . Estradiol 10 MCG TABS vaginal tablet    Sig: Place 1 tablet (10 mcg total) vaginally 2 (two) times a week.    Dispense:  8 tablet    Refill:  11  . spironolactone (ALDACTONE) 25 MG tablet    Sig: Take 1 tablet (25 mg total) by mouth daily.    Dispense:  30 tablet    Refill:  5   No need for labs until physical exam in October 2017--- needs immunization review and  scheduling for colonoscopy at follow-up Needs reevaluation for hirsutism at that point if she has not responded to spironolactone GYN evaluation if no response to topical estrogen She has follow-up with psychiatry She would like [place] to prescribe WelChol as she no longer goes back to cardiology  I have completed the patient encounter in its entirety as documented by the scribe, with editing by me where necessary. Woodie Trusty P. Laney Pastor, M.D.

## 2015-12-01 NOTE — Patient Instructions (Addendum)
     IF you received an x-ray today, you will receive an invoice from [facility]. Please contact [facility] at [phone] with questions or concerns regarding your invoice.   IF you received labwork today, you will receive an invoice from [facility]. Please contact [facility] at [phone] with questions or concerns regarding your invoice.   Our billing staff will not be able to assist you with questions regarding bills from these companies.  You will be contacted with the lab results as soon as they are available. The fastest way to get your results is to activate your My Chart account. Instructions are located on the last page of this paperwork. If you have not heard from us regarding the results in 2 weeks, please contact this office.      

## 2015-12-02 NOTE — Progress Notes (Signed)
LMOM for pt to CB and advise correct dosage of Welchol or pharm where she has been getting it filled recently. [facility] called [facility] [address] and they have not been filling this med.

## 2015-12-15 NOTE — Progress Notes (Signed)
I hadn't heard back from pt so called again. Pt reported that she has been getting Welchol through manufacturer for free. She has been taking 3 tabs Qam and 3 tabs Qpm. Pt stated that she needs to get a form from the manufacturer and will bring it in for [place] to fill out for Rx.

## 2016-01-26 DIAGNOSIS — F319 Bipolar disorder, unspecified: Secondary | ICD-10-CM | POA: Diagnosis not present

## 2016-02-05 DIAGNOSIS — H2513 Age-related nuclear cataract, bilateral: Secondary | ICD-10-CM | POA: Diagnosis not present

## 2016-02-05 DIAGNOSIS — H52222 Regular astigmatism, left eye: Secondary | ICD-10-CM | POA: Diagnosis not present

## 2016-02-05 DIAGNOSIS — H5213 Myopia, bilateral: Secondary | ICD-10-CM | POA: Diagnosis not present

## 2016-02-08 DIAGNOSIS — E041 Nontoxic single thyroid nodule: Secondary | ICD-10-CM | POA: Insufficient documentation

## 2016-02-11 DIAGNOSIS — F319 Bipolar disorder, unspecified: Secondary | ICD-10-CM | POA: Diagnosis not present

## 2016-02-25 ENCOUNTER — Ambulatory Visit (INDEPENDENT_AMBULATORY_CARE_PROVIDER_SITE_OTHER): Payer: PPO | Admitting: Family Medicine

## 2016-02-25 ENCOUNTER — Encounter: Payer: Self-pay | Admitting: Family Medicine

## 2016-02-25 VITALS — BP 138/78 | HR 88 | Temp 98.3°F | Resp 16 | Ht 64.0 in | Wt 201.2 lb

## 2016-02-25 DIAGNOSIS — E049 Nontoxic goiter, unspecified: Secondary | ICD-10-CM | POA: Diagnosis not present

## 2016-02-25 DIAGNOSIS — R5383 Other fatigue: Secondary | ICD-10-CM | POA: Diagnosis not present

## 2016-02-25 DIAGNOSIS — E785 Hyperlipidemia, unspecified: Secondary | ICD-10-CM

## 2016-02-25 DIAGNOSIS — Z8619 Personal history of other infectious and parasitic diseases: Secondary | ICD-10-CM | POA: Diagnosis not present

## 2016-02-25 DIAGNOSIS — R079 Chest pain, unspecified: Secondary | ICD-10-CM | POA: Diagnosis not present

## 2016-02-25 DIAGNOSIS — I10 Essential (primary) hypertension: Secondary | ICD-10-CM | POA: Diagnosis not present

## 2016-02-25 DIAGNOSIS — E559 Vitamin D deficiency, unspecified: Secondary | ICD-10-CM | POA: Diagnosis not present

## 2016-02-25 DIAGNOSIS — E538 Deficiency of other specified B group vitamins: Secondary | ICD-10-CM | POA: Diagnosis not present

## 2016-02-25 MED ORDER — ERGOCALCIFEROL 1.25 MG (50000 UT) PO CAPS
50000.0000 [IU] | ORAL_CAPSULE | ORAL | Status: DC
Start: 2016-02-25 — End: 2016-10-28

## 2016-02-25 MED ORDER — HYDROCHLOROTHIAZIDE 25 MG PO TABS: ORAL_TABLET | ORAL | Status: DC

## 2016-02-25 MED ORDER — OMEPRAZOLE 40 MG PO CPDR: DELAYED_RELEASE_CAPSULE | ORAL | Status: DC

## 2016-02-25 MED ORDER — SPIRONOLACTONE 25 MG PO TABS: 25.0000 mg | ORAL_TABLET | Freq: Every day | ORAL | Status: DC

## 2016-02-25 NOTE — Progress Notes (Signed)
Subjective:    Patient ID: Becky Lawson, female    DOB: [birth year], 65 y.o.   MRN: [id number] Chief Complaint  Patient presents with  . Hypertension    follow up  . has ? about thyroid    HPI Clumps of hair on towels Weight gain Severe facial hair worse over the past mo absouitely exhausted Depression worse Vaginal dryness - is sexually active  No memory change, no breast tenderness. Normal bowels, no hot flashes, cold sweats. Sleeps ok with her meds.  Having very dry skin.   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  Psych [place] with [facility].  Had a partial hystrectomy at 65 yo and then several years leter she had pelvic pain and thought her ovaries dies then so noever really went through menopausal sxs. No gyn.  No recent pap.   Did have recent mammogram after lump was found by PA here  Uses a lot of hydrocodone - 4 a DAY FOR spintal stenossis but past sev wks have been much better and down to 1x/d.  Has not been taking vit D. Would like rx Started taking fish oil, ran out of wellchol - has been off of it for sev mos - was getting it free through manufacturer but she didn't get it. She will need my sig for this.  pshcy has told her to take ginko biloba and vit E Saw side effect about facial haitr and stopped that.  Has not filled the estradiol due to cost. Will stop due to side effect profile  Past Medical History  Diagnosis Date  . Hypertension   . Anxiety   . Bipolar 1 disorder ([place])   . Hyperlipidemia   . Depression   . Hepatitis C     resolved completely after treatment in 2014, genotype 1b, followed at [facility]  . Spinal stenosis   . Arthritis   . Heart murmur   . Chronic kidney disease   . Jaundice 11/01/2012  . Pruritic disorder 02/13/2013   Past Surgical History  Procedure Laterality Date  . Joint replacement      knee  . Partial hysterectomy    . Abdominal  hysterectomy     Current Outpatient Prescriptions on File Prior to Visit  Medication Sig Dispense Refill  . ALPRAZolam (XANAX) 1 MG tablet Take 1 mg by mouth at bedtime as needed for anxiety.    [name] amLODipine (NORVASC) 10 MG tablet Take 1 tablet (10 mg total) by mouth daily. 90 tablet 3  . hydrochlorothiazide (HYDRODIURIL) 25 MG tablet TAKE 1 BY MOUTH DAILY 90 tablet 1  . hydrOXYzine (ATARAX/VISTARIL) 25 MG tablet Take 2 tablets (50 mg total) by mouth every 6 (six) hours as needed. PATIENT NEEDS OFFICE VISIT FOR ADDITIONAL REFILLS 40 tablet 0  . lamoTRIgine (LAMICTAL) 200 MG tablet Take 200 mg by mouth 2 (two) times daily.    . colesevelam (WELCHOL) 625 MG tablet Take 1 tablet (625 mg total) by mouth 2 (two) times daily with a meal. (Patient not taking: Reported on 02/25/2016) 180 tablet 3  . ergocalciferol (VITAMIN D2) 50000 units capsule Take 1 capsule (50,000 Units total) by mouth once a week. (Patient not taking: Reported on 02/25/2016) 12 capsule 1  . Estradiol 10 MCG TABS vaginal tablet Place 1 tablet (10 mcg total) vaginally 2 (two) times a week. (Patient not taking: Reported on 02/25/2016) 8 tablet 11  .  omeprazole (PRILOSEC) 40 MG capsule TAKE 1 BY MOUTH DAILY (Patient not taking: Reported on 02/25/2016) 90 capsule 1  . oxyCODONE-acetaminophen (PERCOCET) 10-325 MG per tablet Take 1 tablet by mouth every 8 (eight) hours as needed for pain. Reported on 02/25/2016    . spironolactone (ALDACTONE) 25 MG tablet Take 1 tablet (25 mg total) by mouth daily. (Patient not taking: Reported on 02/25/2016) 30 tablet 5  . trazodone (DESYREL) 300 MG tablet Take 0.5 tablets (150 mg total) by mouth at bedtime. (Patient not taking: Reported on 02/25/2016) 30 tablet 1   No current facility-administered medications on file prior to visit.   Not on File Family History  Problem Relation Age of Onset  . Diabetes Mother   . Heart disease Mother   . Hyperlipidemia Mother   . Hypertension Mother   . Stroke Mother     . Hypertension Father   . Parkinson's disease Father   . Heart disease Father   . Hyperlipidemia Father   . Hemachromatosis Daughter    Social History   Social History  . Marital Status: Divorced    Spouse Name: N/A  . Number of Children: N/A  . Years of Education: N/A   Occupational History  . Personal Caregiver    Social History Main Topics  . Smoking status: Former Research scientist (life sciences)  . Smokeless tobacco: None  . Alcohol Use: 0.6 oz/week    1 Standard drinks or equivalent per week     Comment: 1 glass of red wine daily  . Drug Use: No  . Sexual Activity: Yes    Birth Control/ Protection: None   Other Topics Concern  . None   Social History Narrative   Divorced. Education: [employer]. Exercise: No.     Review of Systems See hpi    Objective:  BP 138/78 mmHg  Pulse 88  Temp(Src) 98.3 F (36.8 C) (Oral)  Resp 16  Ht 5\' 4"  (1.626 m)  Wt 201 lb 3.2 oz (91.264 kg)  BMI 34.52 kg/m2  Physical Exam  Constitutional: She is oriented to person, place, and time. She appears well-developed and well-nourished. No distress.  HENT:  Head: Normocephalic and atraumatic.  Right Ear: External ear normal.  Left Ear: External ear normal.  Eyes: Conjunctivae are normal. No scleral icterus.  Neck: Normal range of motion. Neck supple. No thyromegaly present.  Cardiovascular: Normal rate, regular rhythm, normal heart sounds and intact distal pulses.   Pulmonary/Chest: Effort normal and breath sounds normal. No respiratory distress.  Musculoskeletal: She exhibits no edema.  Lymphadenopathy:    She has no cervical adenopathy.  Neurological: She is alert and oriented to person, place, and time.  Skin: Skin is warm and dry. She is not diaphoretic. No erythema.  Psychiatric: She has a normal mood and affect. Her behavior is normal.         [facility] reading (PRIMARY) by  Dr. Brigitte Pulse.  EKG: NSR, no acute ischemic changes Assessment & Plan:   1. Other fatigue   2. Vitamin D deficiency   3.  Essential hypertension   4. Hyperlipidemia   5. History of hepatitis C   6. Enlarged thyroid   7. Chest pain, unspecified chest pain type     Orders Placed This Encounter  Procedures  . US Soft Tissue Head/Neck    Ins-HTA  202 lbs/no needs AW and pt with epic order     Standing Status: Future     Number of Occurrences: 1     Standing Expiration Date:  04/26/2017    Order Specific Question:  Reason for Exam (SYMPTOM  OR DIAGNOSIS REQUIRED)    Answer:  enlarged right lobe of thyroid    Order Specific Question:  Preferred imaging location?    Answer:  [facility]  . Comprehensive metabolic panel  . CBC  . Vitamin B12  . Thyroid Panel With TSH  . Hepatitis c vrs RNA detect by PCR-qual  . EKG 12-Lead    Meds ordered this encounter  Medications  . buPROPion (ZYBAN) 150 MG 12 hr tablet    Sig: Take 150 mg by mouth 2 (two) times daily.  . QUEtiapine Fumarate (SEROQUEL XR PO)    Sig: Take 100 mg by mouth.  [name] HYDROcodone-acetaminophen (NORCO) 10-325 MG tablet    Sig: Take 1 tablet by mouth every 6 (six) hours as needed.  [name] buPROPion (WELLBUTRIN) 100 MG tablet    Sig: Take 100 mg by mouth 2 (two) times daily.  [name] spironolactone (ALDACTONE) 25 MG tablet    Sig: Take 1 tablet (25 mg total) by mouth daily.    Dispense:  90 tablet    Refill:  1  . omeprazole (PRILOSEC) 40 MG capsule    Sig: TAKE 1 BY MOUTH DAILY    Dispense:  90 capsule    Refill:  1  . hydrochlorothiazide (HYDRODIURIL) 25 MG tablet    Sig: TAKE 1 BY MOUTH DAILY    Dispense:  90 tablet    Refill:  1  . ergocalciferol (VITAMIN D2) 50000 units capsule    Sig: Take 1 capsule (50,000 Units total) by mouth once a week.    Dispense:  24 capsule    Refill:  0     [name], M.D.  [facility] [address] [place], [place] [address] [phone] phone [phone] fax  03/04/2016 11:42 AM

## 2016-02-25 NOTE — Patient Instructions (Signed)
     IF you received an x-ray today, you will receive an invoice from [facility]. Please contact [facility] at [phone] with questions or concerns regarding your invoice.   IF you received labwork today, you will receive an invoice from [facility]. Please contact [facility] at [phone] with questions or concerns regarding your invoice.   Our billing staff will not be able to assist you with questions regarding bills from these companies.  You will be contacted with the lab results as soon as they are available. The fastest way to get your results is to activate your My Chart account. Instructions are located on the last page of this paperwork. If you have not heard from us regarding the results in 2 weeks, please contact this office.      

## 2016-02-26 LAB — COMPREHENSIVE METABOLIC PANEL WITH GFR
ALT: 11 U/L (ref 6–29)
AST: 19 U/L (ref 10–35)
Albumin: 4.4 g/dL (ref 3.6–5.1)
Alkaline Phosphatase: 78 U/L (ref 33–130)
BUN: 14 mg/dL (ref 7–25)
CO2: 26 mmol/L (ref 20–31)
Calcium: 9.2 mg/dL (ref 8.6–10.4)
Chloride: 101 mmol/L (ref 98–110)
Creat: 0.83 mg/dL (ref 0.50–0.99)
Glucose, Bld: 92 mg/dL (ref 65–99)
Potassium: 4.1 mmol/L (ref 3.5–5.3)
Sodium: 135 mmol/L (ref 135–146)
Total Bilirubin: 0.4 mg/dL (ref 0.2–1.2)
Total Protein: 6.6 g/dL (ref 6.1–8.1)

## 2016-02-26 LAB — THYROID PANEL WITH TSH
Free Thyroxine Index: 2.8 (ref 1.4–3.8)
T3 Uptake: 32 % (ref 22–35)
T4 TOTAL: 8.6 ug/dL (ref 4.5–12.0)
TSH: 1.73 m[IU]/L

## 2016-02-26 LAB — CBC
HCT: 41.5 % (ref 35.0–45.0)
Hemoglobin: 14.3 g/dL (ref 11.7–15.5)
MCH: 31.8 pg (ref 27.0–33.0)
MCHC: 34.5 g/dL (ref 32.0–36.0)
MCV: 92.2 fL (ref 80.0–100.0)
MPV: 9.9 fL (ref 7.5–12.5)
Platelets: 231 K/uL (ref 140–400)
RBC: 4.5 MIL/uL (ref 3.80–5.10)
RDW: 13.6 % (ref 11.0–15.0)
WBC: 9.5 K/uL (ref 3.8–10.8)

## 2016-02-26 LAB — VITAMIN B12: Vitamin B-12: 685 pg/mL (ref 200–1100)

## 2016-02-29 LAB — HEPATITIS C VRS RNA DETECT BY PCR-QUAL: Hepatitis C Vrs RNA by PCR-Qual: NOT DETECTED

## 2016-03-03 ENCOUNTER — Ambulatory Visit
Admission: RE | Admit: 2016-03-03 | Discharge: 2016-03-03 | Disposition: A | Discharge status: Home / Self Care | Payer: PPO | Source: Ambulatory Visit | Attending: Family Medicine | Admitting: Family Medicine

## 2016-03-03 DIAGNOSIS — E042 Nontoxic multinodular goiter: Secondary | ICD-10-CM | POA: Diagnosis not present

## 2016-03-03 DIAGNOSIS — E049 Nontoxic goiter, unspecified: Secondary | ICD-10-CM

## 2016-03-17 ENCOUNTER — Other Ambulatory Visit: Payer: Self-pay | Admitting: Family Medicine

## 2016-03-17 DIAGNOSIS — E041 Nontoxic single thyroid nodule: Secondary | ICD-10-CM

## 2016-04-08 DIAGNOSIS — F319 Bipolar disorder, unspecified: Secondary | ICD-10-CM | POA: Diagnosis not present

## 2016-04-26 ENCOUNTER — Other Ambulatory Visit (HOSPITAL_COMMUNITY)
Admission: RE | Admit: 2016-04-26 | Discharge: 2016-04-26 | Disposition: A | Discharge status: Home / Self Care | Payer: PPO | Source: Ambulatory Visit | Attending: Radiology | Admitting: Radiology

## 2016-04-26 ENCOUNTER — Ambulatory Visit
Admission: RE | Admit: 2016-04-26 | Discharge: 2016-04-26 | Disposition: A | Discharge status: Home / Self Care | Payer: PPO | Source: Ambulatory Visit | Attending: Family Medicine | Admitting: Family Medicine

## 2016-04-26 DIAGNOSIS — E041 Nontoxic single thyroid nodule: Secondary | ICD-10-CM | POA: Diagnosis not present

## 2016-04-28 ENCOUNTER — Other Ambulatory Visit: Payer: Self-pay | Admitting: Family Medicine

## 2016-04-28 DIAGNOSIS — D497 Neoplasm of unspecified behavior of endocrine glands and other parts of nervous system: Secondary | ICD-10-CM

## 2016-05-04 ENCOUNTER — Telehealth: Payer: Self-pay

## 2016-05-04 NOTE — Telephone Encounter (Signed)
Siara from [facility] is calling behalf of patient Becky Lawson. Siara stated she faxed over a clearance form in order for Noretta to have treatment. Patient stated to dental office she have thyroid cancer. The office want to make sure patient is safe before having treatment. The office request for Dr. Brigitte Pulse to complete form. Office number [phone].

## 2016-05-04 NOTE — Telephone Encounter (Signed)
Dr Brigitte Pulse have you seen this form?

## 2016-05-06 DIAGNOSIS — M5136 Other intervertebral disc degeneration, lumbar region: Secondary | ICD-10-CM | POA: Diagnosis not present

## 2016-05-06 DIAGNOSIS — G894 Chronic pain syndrome: Secondary | ICD-10-CM | POA: Diagnosis not present

## 2016-05-06 DIAGNOSIS — M4806 Spinal stenosis, lumbar region: Secondary | ICD-10-CM | POA: Diagnosis not present

## 2016-05-06 DIAGNOSIS — M1612 Unilateral primary osteoarthritis, left hip: Secondary | ICD-10-CM | POA: Diagnosis not present

## 2016-05-06 DIAGNOSIS — M5442 Lumbago with sciatica, left side: Secondary | ICD-10-CM | POA: Diagnosis not present

## 2016-05-06 NOTE — Telephone Encounter (Signed)
No restrictions to proceeding with dental treatment.

## 2016-05-06 NOTE — Telephone Encounter (Signed)
Called and left message at [facility] but they are closed.

## 2016-05-06 NOTE — Telephone Encounter (Signed)
[facility] called to check the status of record release.Marland KitchenMarland KitchenMarland Kitchen

## 2016-05-10 NOTE — Telephone Encounter (Signed)
Will send over no restrictions form for patient to proceed with dental treatment [facility]

## 2016-05-11 NOTE — Telephone Encounter (Signed)
[facility] called again stating they have not received the fax/all clear form for patient. Please verify that we have the correct fax number: [phone].

## 2016-05-13 DIAGNOSIS — E041 Nontoxic single thyroid nodule: Secondary | ICD-10-CM | POA: Diagnosis not present

## 2016-05-13 NOTE — Telephone Encounter (Signed)
Yes, I completed this form and placed it in the "fax" slot in the provider's lounge several days ago. I might have put it in there Wed 9/6 a.m.

## 2016-05-13 NOTE — Telephone Encounter (Signed)
Dr. Brigitte Pulse, just seeing if you ever remember seeing this form?

## 2016-05-16 NOTE — Telephone Encounter (Signed)
Message left on voice mail regarding if patient picked up fax.

## 2016-05-26 DIAGNOSIS — F319 Bipolar disorder, unspecified: Secondary | ICD-10-CM | POA: Diagnosis not present

## 2016-05-31 ENCOUNTER — Ambulatory Visit (INDEPENDENT_AMBULATORY_CARE_PROVIDER_SITE_OTHER): Payer: PPO | Admitting: Endocrinology

## 2016-05-31 ENCOUNTER — Encounter: Payer: Self-pay | Admitting: Endocrinology

## 2016-05-31 ENCOUNTER — Ambulatory Visit: Payer: PPO | Admitting: Endocrinology

## 2016-05-31 DIAGNOSIS — E042 Nontoxic multinodular goiter: Secondary | ICD-10-CM | POA: Diagnosis not present

## 2016-05-31 NOTE — Patient Instructions (Signed)
Please come back 1-2 weeks after the surgery, whether cancer is found or not. If there is cancer, please do not take thyroid hormone medication when you go home from the hospital.

## 2016-05-31 NOTE — Progress Notes (Signed)
Subjective:    Patient ID: Becky Lawson, female    DOB: [birth year], 65 y.o.   MRN: [id number]  HPI In mid-2017, pt was incidentally noted on routine physical to have slight swelling at the anterior neck.  No assoc pain.  she is unaware of ever having had thyroid problems in the past.  He has no h/o XRT or surgery to the neck.   Past Medical History:  Diagnosis Date  . Anxiety   . Arthritis   . Bipolar 1 disorder ([place])   . Chronic kidney disease   . Depression   . Heart murmur   . Hepatitis C    resolved completely after treatment in 2014, genotype 1b, followed at [facility]  . Hyperlipidemia   . Hypertension   . Jaundice 11/01/2012  . Pruritic disorder 02/13/2013  . Spinal stenosis     Past Surgical History:  Procedure Laterality Date  . ABDOMINAL HYSTERECTOMY    . JOINT REPLACEMENT     knee  . PARTIAL HYSTERECTOMY      Social History   Social History  . Marital status: Divorced    Spouse name: N/A  . Number of children: N/A  . Years of education: N/A   Occupational History  . Personal Caregiver    Social History Main Topics  . Smoking status: Former Research scientist (life sciences)  . Smokeless tobacco: Not on file  . Alcohol use 0.6 oz/week    1 Standard drinks or equivalent per week     Comment: 1 glass of red wine daily  . Drug use: No  . Sexual activity: Yes    Birth control/ protection: None   Other Topics Concern  . Not on file   Social History Narrative   Divorced. Education: [employer]. Exercise: No.    Current Outpatient Prescriptions on File Prior to Visit  Medication Sig Dispense Refill  . ALPRAZolam (XANAX) 1 MG tablet Take 1 mg by mouth at bedtime as needed for anxiety.    [name] amLODipine (NORVASC) 10 MG tablet Take 1 tablet (10 mg total) by mouth daily. 90 tablet 3  . buPROPion (WELLBUTRIN) 100 MG tablet Take 100 mg by mouth 2 (two) times daily.    [name] buPROPion (ZYBAN) 150 MG 12 hr tablet Take 150 mg by mouth 2 (two) times daily.    . colesevelam (WELCHOL) 625 MG tablet Take 1  tablet (625 mg total) by mouth 2 (two) times daily with a meal. 180 tablet 3  . ergocalciferol (VITAMIN D2) 50000 units capsule Take 1 capsule (50,000 Units total) by mouth once a week. 24 capsule 0  . hydrochlorothiazide (HYDRODIURIL) 25 MG tablet TAKE 1 BY MOUTH DAILY 90 tablet 1  . HYDROcodone-acetaminophen (NORCO) 10-325 MG tablet Take 1 tablet by mouth 3 (three) times daily.     . hydrOXYzine (ATARAX/VISTARIL) 25 MG tablet Take 2 tablets (50 mg total) by mouth every 6 (six) hours as needed. PATIENT NEEDS OFFICE VISIT FOR ADDITIONAL REFILLS 40 tablet 0  . lamoTRIgine (LAMICTAL) 200 MG tablet Take 200 mg by mouth 2 (two) times daily.    [name] omeprazole (PRILOSEC) 40 MG capsule TAKE 1 BY MOUTH DAILY 90 capsule 1  . QUEtiapine Fumarate (SEROQUEL XR PO) Take 100 mg by mouth.    . spironolactone (ALDACTONE) 25 MG tablet Take 1 tablet (25 mg total) by mouth daily. 90 tablet 1  . trazodone (DESYREL) 300 MG tablet Take 0.5 tablets (150 mg total) by mouth at bedtime. 30 tablet 1   No current facility-administered  medications on file prior to visit.     No Known Allergies  Family History  Problem Relation Age of Onset  . Diabetes Mother   . Heart disease Mother   . Hyperlipidemia Mother   . Hypertension Mother   . Stroke Mother   . Hypertension Father   . Parkinson's disease Father   . Heart disease Father   . Hyperlipidemia Father   . Hemachromatosis Daughter   . Thyroid disease Neg Hx     BP 136/84   Pulse 96   Ht 5\' 4"  (1.626 m)   Wt 200 lb (90.7 kg)   SpO2 95%   BMI 34.33 kg/m    Review of Systems Denies visual loss, chest pain, sob, dysphagia, skin rash, easy bruising, cold intolerance, headache, numbness, and rhinorrhea.  She has slight hoarseness and weight gain.  Depression is well-controlled.     Objective:   Physical Exam VS: see vs page GEN: no distress HEAD: head: no deformity eyes: no periorbital swelling, no proptosis external nose and ears are normal mouth: no  lesion seen NECK: thyroid is slightly enlarged, with irreg surface, but I cannot appreciate any discrete nodule.   CHEST WALL: no deformity LUNGS: clear to auscultation CV: reg rate and rhythm, no murmur ABD: abdomen is soft, nontender.  no hepatosplenomegaly.  not distended.  no hernia MUSCULOSKELETAL: muscle bulk and strength are grossly normal.  no obvious joint swelling.  gait is normal and steady EXTEMITIES: no deformity.  no ulcer on the feet.  feet are of normal color and temp.  no edema PULSES: dorsalis pedis intact bilat.  no carotid bruit NEURO:  cn 2-12 grossly intact.   readily moves all 4's.  sensation is intact to touch on the feet SKIN:  Normal texture and temperature.  No rash or suspicious lesion is visible.   NODES:  None palpable at the neck PSYCH: alert, well-oriented.  Does not appear anxious nor depressed.     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

## 2016-06-02 DIAGNOSIS — E042 Nontoxic multinodular goiter: Secondary | ICD-10-CM | POA: Insufficient documentation

## 2016-06-07 ENCOUNTER — Ambulatory Visit: Payer: Self-pay | Admitting: General Surgery

## 2016-06-08 NOTE — Pre-Procedure Instructions (Signed)
Becky Lawson  06/08/2016     Your procedure is scheduled on : Thursday June 16, 2016 at 8:00 AM.  Report to [facility] Admitting at 6:00 AM.  Call this number if you have problems the morning of surgery: [phone]    Remember:  Do not eat food or drink liquids after midnight.  Take these medicines the morning of surgery with A SIP OF WATER : Bupropion (Wellbutrin), Hydrocodone if needed, Hydroxyzine (Vistaril) if needed, Lamotrigine (Lamictal)   Stop taking any vitamins, herbal medications/supplements, NSAIDs, Ibuprofen, Advil, Motrin, Aleve, Fish Oil, etc today   Do not wear jewelry, make-up or nail polish.  Do not wear lotions, powders, perfumes, or deoderant.  Do not shave 48 hours prior to surgery.    Do not bring valuables to the hospital.  [facility] is not responsible for any belongings or valuables.  Contacts, dentures or bridgework may not be worn into surgery.  Leave your suitcase in the car.  After surgery it may be brought to your room.  For patients admitted to the hospital, discharge time will be determined by your treatment team.  Patients discharged the day of surgery will not be allowed to drive home.   Name and phone number of your driver:    Special instructions:  Shower using CHG soap the night before and the morning of your surgery  Please read over the following fact sheets that you were given.

## 2016-06-09 ENCOUNTER — Encounter (HOSPITAL_COMMUNITY): Payer: Self-pay

## 2016-06-09 ENCOUNTER — Encounter (HOSPITAL_COMMUNITY)
Admission: RE | Admit: 2016-06-09 | Discharge: 2016-06-09 | Disposition: A | Discharge status: Home / Self Care | Payer: PPO | Source: Ambulatory Visit | Attending: General Surgery | Admitting: General Surgery

## 2016-06-09 ENCOUNTER — Encounter (HOSPITAL_COMMUNITY)
Admission: RE | Admit: 2016-06-09 | Discharge: 2016-06-09 | Disposition: A | Discharge status: Home / Self Care | Payer: PPO | Source: Ambulatory Visit | Attending: Anesthesiology | Admitting: Anesthesiology

## 2016-06-09 DIAGNOSIS — N189 Chronic kidney disease, unspecified: Secondary | ICD-10-CM | POA: Insufficient documentation

## 2016-06-09 DIAGNOSIS — I129 Hypertensive chronic kidney disease with stage 1 through stage 4 chronic kidney disease, or unspecified chronic kidney disease: Secondary | ICD-10-CM | POA: Diagnosis not present

## 2016-06-09 DIAGNOSIS — Z8619 Personal history of other infectious and parasitic diseases: Secondary | ICD-10-CM | POA: Diagnosis not present

## 2016-06-09 DIAGNOSIS — E041 Nontoxic single thyroid nodule: Secondary | ICD-10-CM | POA: Insufficient documentation

## 2016-06-09 DIAGNOSIS — G4733 Obstructive sleep apnea (adult) (pediatric): Secondary | ICD-10-CM | POA: Insufficient documentation

## 2016-06-09 DIAGNOSIS — Z01812 Encounter for preprocedural laboratory examination: Secondary | ICD-10-CM | POA: Insufficient documentation

## 2016-06-09 DIAGNOSIS — E785 Hyperlipidemia, unspecified: Secondary | ICD-10-CM | POA: Insufficient documentation

## 2016-06-09 DIAGNOSIS — J9811 Atelectasis: Secondary | ICD-10-CM | POA: Diagnosis not present

## 2016-06-09 DIAGNOSIS — Z79899 Other long term (current) drug therapy: Secondary | ICD-10-CM | POA: Diagnosis not present

## 2016-06-09 DIAGNOSIS — Z01818 Encounter for other preprocedural examination: Secondary | ICD-10-CM | POA: Insufficient documentation

## 2016-06-09 DIAGNOSIS — R918 Other nonspecific abnormal finding of lung field: Secondary | ICD-10-CM | POA: Diagnosis not present

## 2016-06-09 DIAGNOSIS — K219 Gastro-esophageal reflux disease without esophagitis: Secondary | ICD-10-CM | POA: Insufficient documentation

## 2016-06-09 HISTORY — DX: Gastro-esophageal reflux disease without esophagitis: K21.9

## 2016-06-09 HISTORY — DX: Sleep apnea, unspecified: G47.30

## 2016-06-09 LAB — COMPREHENSIVE METABOLIC PANEL
ALBUMIN: 4.5 g/dL (ref 3.5–5.0)
ALK PHOS: 65 U/L (ref 38–126)
ALT: 17 U/L (ref 14–54)
ANION GAP: 10 (ref 5–15)
AST: 24 U/L (ref 15–41)
BUN: 10 mg/dL (ref 6–20)
CO2: 26 mmol/L (ref 22–32)
Calcium: 9.7 mg/dL (ref 8.9–10.3)
Chloride: 97 mmol/L — ABNORMAL LOW (ref 101–111)
Creatinine, Ser: 0.86 mg/dL (ref 0.44–1.00)
GFR calc Af Amer: >60 mL/min (ref >60)
GFR calc non Af Amer: >60 mL/min (ref >60)
GLUCOSE: 101 mg/dL — AB (ref 65–99)
POTASSIUM: 3.7 mmol/L (ref 3.5–5.1)
SODIUM: 133 mmol/L — AB (ref 135–145)
Total Bilirubin: 0.9 mg/dL (ref 0.3–1.2)
Total Protein: 7.1 g/dL (ref 6.5–8.1)

## 2016-06-09 LAB — CBC
HCT: 43.4 % (ref 36.0–46.0)
HEMOGLOBIN: 14.9 g/dL (ref 12.0–15.0)
MCH: 31.4 pg (ref 26.0–34.0)
MCHC: 34.3 g/dL (ref 30.0–36.0)
MCV: 91.4 fL (ref 78.0–100.0)
Platelets: 207 10*3/uL (ref 150–400)
RBC: 4.75 MIL/uL (ref 3.87–5.11)
RDW: 12.4 % (ref 11.5–15.5)
WBC: 6.2 10*3/uL (ref 4.0–10.5)

## 2016-06-09 MED ORDER — CHLORHEXIDINE GLUCONATE CLOTH 2 % EX PADS: 6.0000 | MEDICATED_PAD | Freq: Once | CUTANEOUS | Status: DC

## 2016-06-09 NOTE — Progress Notes (Signed)
PCP is [name]  Patient denied having any acute cardiac or pulmonary issues.

## 2016-06-10 NOTE — Progress Notes (Signed)
Anesthesia Chart Review:  Pt is a 65 year old female scheduled for L thyroid lobectomy on 06/16/2016 with Greer Pickerel, MD.   PMH includes:  HTN, heart murmur, hyperlipidemia, OSA, hepatitis C (resolved after treatment 2014), bipolar disorder, CKD, GERD. Former smoker. BMI 34  Medications include: amlodipine, hctz, prilosec, trileptal, spironolactone  Preoperative labs reviewed.    CXR 06/09/16: Low lung volumes with mild bibasilar atelectasis.  EKG 02/25/16: sinus rhythm  Carotid duplex 11/13/13: B ICA with heterogeneous plaque, no significant stenosis  Echo 10/10/13:  1. LV cavity is normal in size. Mild concentric hypertrophy. Normal diastolic filling. Normal global wall motion. Normal systolic function. EF 66% 2. LA cavity mildly dilated. 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 Mild mitral regurgitation 4. Mild tricuspid valve regurgitation 5. Mild aortic sclerosis without stenosis  Exercise treadmill test 10/08/13: Negative for ischemia. Achieved 7.3 METS  If no changes, I anticipate pt can proceed with surgery as scheduled.   Willeen Cass, FNP-BC [facility]/Anesthesiology Phone: [phone] 06/10/2016 2:51 PM

## 2016-06-15 NOTE — Anesthesia Preprocedure Evaluation (Addendum)
Anesthesia Evaluation  Patient identified by MRN, date of birth, ID band Patient awake    Reviewed: Allergy & Precautions, NPO status , Patient's Chart, lab work & pertinent test results  Airway Mallampati: II  TM Distance: >3 FB Neck ROM: Full    Dental no notable dental hx. (+) Teeth Intact, Dental Advidsory Given   Pulmonary neg pulmonary ROS, sleep apnea , former smoker,    Pulmonary exam normal breath sounds clear to auscultation       Cardiovascular hypertension, Normal cardiovascular exam+ Valvular Problems/Murmurs  Rhythm:Regular Rate:Normal     Neuro/Psych PSYCHIATRIC DISORDERS Anxiety Depression Bipolar Disorder negative neurological ROS     GI/Hepatic GERD  ,(+) Hepatitis -  Endo/Other  negative endocrine ROS  Renal/GU Renal disease     Musculoskeletal  (+) Arthritis ,   Abdominal   Peds  Hematology negative hematology ROS (+)   Anesthesia Other Findings   Reproductive/Obstetrics negative OB ROS                           Anesthesia Physical Anesthesia Plan  ASA: III  Anesthesia Plan: General   Post-op Pain Management:    Induction: Intravenous  Airway Management Planned: Oral ETT  Additional Equipment:   Intra-op Plan:   Post-operative Plan: Extubation in OR  Informed Consent: I have reviewed the patients History and Physical, chart, labs and discussed the procedure including the risks, benefits and alternatives for the proposed anesthesia with the patient or authorized representative who has indicated his/her understanding and acceptance.   Dental advisory given and Dental Advisory Given  Plan Discussed with: CRNA, Anesthesiologist and Surgeon  Anesthesia Plan Comments:       Anesthesia Quick Evaluation

## 2016-06-16 ENCOUNTER — Ambulatory Visit (HOSPITAL_COMMUNITY): Payer: PPO | Admitting: Anesthesiology

## 2016-06-16 ENCOUNTER — Ambulatory Visit (HOSPITAL_COMMUNITY): Payer: PPO | Admitting: Emergency Medicine

## 2016-06-16 ENCOUNTER — Encounter (HOSPITAL_COMMUNITY): Payer: Self-pay | Admitting: *Deleted

## 2016-06-16 ENCOUNTER — Observation Stay (HOSPITAL_COMMUNITY)
Admission: RE | Admit: 2016-06-16 | Discharge: 2016-06-17 | Disposition: A | Discharge status: Home / Self Care | Payer: PPO | Source: Ambulatory Visit | Attending: General Surgery | Admitting: General Surgery

## 2016-06-16 ENCOUNTER — Encounter (HOSPITAL_COMMUNITY): Admission: RE | Disposition: A | Discharge status: Home / Self Care | Payer: Self-pay | Source: Ambulatory Visit

## 2016-06-16 DIAGNOSIS — Z90710 Acquired absence of both cervix and uterus: Secondary | ICD-10-CM | POA: Diagnosis not present

## 2016-06-16 DIAGNOSIS — G4730 Sleep apnea, unspecified: Secondary | ICD-10-CM | POA: Diagnosis not present

## 2016-06-16 DIAGNOSIS — M1990 Unspecified osteoarthritis, unspecified site: Secondary | ICD-10-CM | POA: Insufficient documentation

## 2016-06-16 DIAGNOSIS — F419 Anxiety disorder, unspecified: Secondary | ICD-10-CM | POA: Diagnosis not present

## 2016-06-16 DIAGNOSIS — E890 Postprocedural hypothyroidism: Secondary | ICD-10-CM

## 2016-06-16 DIAGNOSIS — E041 Nontoxic single thyroid nodule: Secondary | ICD-10-CM | POA: Diagnosis not present

## 2016-06-16 DIAGNOSIS — Z23 Encounter for immunization: Secondary | ICD-10-CM | POA: Diagnosis not present

## 2016-06-16 DIAGNOSIS — Z823 Family history of stroke: Secondary | ICD-10-CM | POA: Diagnosis not present

## 2016-06-16 DIAGNOSIS — E785 Hyperlipidemia, unspecified: Secondary | ICD-10-CM | POA: Insufficient documentation

## 2016-06-16 DIAGNOSIS — C73 Malignant neoplasm of thyroid gland: Principal | ICD-10-CM | POA: Insufficient documentation

## 2016-06-16 DIAGNOSIS — M4800 Spinal stenosis, site unspecified: Secondary | ICD-10-CM | POA: Diagnosis not present

## 2016-06-16 DIAGNOSIS — N189 Chronic kidney disease, unspecified: Secondary | ICD-10-CM | POA: Diagnosis not present

## 2016-06-16 DIAGNOSIS — Z87891 Personal history of nicotine dependence: Secondary | ICD-10-CM | POA: Insufficient documentation

## 2016-06-16 DIAGNOSIS — Z833 Family history of diabetes mellitus: Secondary | ICD-10-CM | POA: Insufficient documentation

## 2016-06-16 DIAGNOSIS — F319 Bipolar disorder, unspecified: Secondary | ICD-10-CM | POA: Diagnosis not present

## 2016-06-16 DIAGNOSIS — D34 Benign neoplasm of thyroid gland: Secondary | ICD-10-CM | POA: Diagnosis not present

## 2016-06-16 DIAGNOSIS — I129 Hypertensive chronic kidney disease with stage 1 through stage 4 chronic kidney disease, or unspecified chronic kidney disease: Secondary | ICD-10-CM | POA: Insufficient documentation

## 2016-06-16 DIAGNOSIS — Z8249 Family history of ischemic heart disease and other diseases of the circulatory system: Secondary | ICD-10-CM | POA: Diagnosis not present

## 2016-06-16 DIAGNOSIS — K219 Gastro-esophageal reflux disease without esophagitis: Secondary | ICD-10-CM | POA: Insufficient documentation

## 2016-06-16 DIAGNOSIS — Z9889 Other specified postprocedural states: Secondary | ICD-10-CM

## 2016-06-16 DIAGNOSIS — Z8619 Personal history of other infectious and parasitic diseases: Secondary | ICD-10-CM | POA: Insufficient documentation

## 2016-06-16 HISTORY — PX: THYROID LOBECTOMY: SHX420

## 2016-06-16 SURGERY — LOBECTOMY, THYROID
Anesthesia: General | Site: Neck | Laterality: Left

## 2016-06-16 MED ORDER — LIDOCAINE HCL (CARDIAC) 20 MG/ML IV SOLN
INTRAVENOUS | Status: DC | PRN
  Administered 2016-06-16: 100 mg via INTRAVENOUS

## 2016-06-16 MED ORDER — FENTANYL CITRATE (PF) 100 MCG/2ML IJ SOLN
INTRAMUSCULAR | Status: AC
  Filled 2016-06-16: qty 2

## 2016-06-16 MED ORDER — MIDAZOLAM HCL 2 MG/2ML IJ SOLN
INTRAMUSCULAR | Status: AC
  Filled 2016-06-16: qty 2

## 2016-06-16 MED ORDER — ACETAMINOPHEN 500 MG PO TABS
1000.0000 mg | ORAL_TABLET | ORAL | Status: AC
  Administered 2016-06-16: 1000 mg via ORAL

## 2016-06-16 MED ORDER — HYDROMORPHONE HCL 1 MG/ML IJ SOLN
0.2500 mg | INTRAMUSCULAR | Status: DC | PRN
  Administered 2016-06-16 (×4): 0.5 mg via INTRAVENOUS

## 2016-06-16 MED ORDER — HYDROCHLOROTHIAZIDE 25 MG PO TABS
25.0000 mg | ORAL_TABLET | Freq: Every day | ORAL | Status: DC
  Administered 2016-06-17: 25 mg via ORAL
  Filled 2016-06-16: qty 1

## 2016-06-16 MED ORDER — BUPIVACAINE-EPINEPHRINE 0.25% -1:200000 IJ SOLN
INTRAMUSCULAR | Status: AC
  Filled 2016-06-16: qty 1

## 2016-06-16 MED ORDER — ONDANSETRON 4 MG PO TBDP: 4.0000 mg | ORAL_TABLET | Freq: Four times a day (QID) | ORAL | Status: DC | PRN

## 2016-06-16 MED ORDER — ACETAMINOPHEN 500 MG PO TABS
ORAL_TABLET | ORAL | Status: AC
  Administered 2016-06-16: 1000 mg via ORAL
  Filled 2016-06-16: qty 2

## 2016-06-16 MED ORDER — SUMATRIPTAN SUCCINATE 6 MG/0.5ML ~~LOC~~ SOLN
6.0000 mg | Freq: Once | SUBCUTANEOUS | Status: AC
  Administered 2016-06-16: 6 mg via SUBCUTANEOUS
  Filled 2016-06-16: qty 0.5

## 2016-06-16 MED ORDER — OXCARBAZEPINE 300 MG PO TABS
300.0000 mg | ORAL_TABLET | Freq: Every day | ORAL | Status: DC
  Administered 2016-06-16: 300 mg via ORAL
  Filled 2016-06-16: qty 1

## 2016-06-16 MED ORDER — HEMOSTATIC AGENTS (NO CHARGE) OPTIME
TOPICAL | Status: DC | PRN
  Administered 2016-06-16: 1 via TOPICAL

## 2016-06-16 MED ORDER — 0.9 % SODIUM CHLORIDE (POUR BTL) OPTIME
TOPICAL | Status: DC | PRN
  Administered 2016-06-16: 1000 mL

## 2016-06-16 MED ORDER — CEFAZOLIN SODIUM-DEXTROSE 2-4 GM/100ML-% IV SOLN
2.0000 g | INTRAVENOUS | Status: AC
  Administered 2016-06-16: 2 g via INTRAVENOUS

## 2016-06-16 MED ORDER — HYDROCODONE-ACETAMINOPHEN 5-325 MG PO TABS
1.0000 | ORAL_TABLET | ORAL | Status: DC | PRN
  Administered 2016-06-16 – 2016-06-17 (×5): 2 via ORAL
  Filled 2016-06-16 (×5): qty 2

## 2016-06-16 MED ORDER — AMLODIPINE BESYLATE 10 MG PO TABS
10.0000 mg | ORAL_TABLET | Freq: Every evening | ORAL | Status: DC
  Filled 2016-06-16: qty 1

## 2016-06-16 MED ORDER — PROMETHAZINE HCL 25 MG/ML IJ SOLN
INTRAMUSCULAR | Status: AC
  Filled 2016-06-16: qty 1

## 2016-06-16 MED ORDER — HYDRALAZINE HCL 20 MG/ML IJ SOLN: 10.0000 mg | INTRAMUSCULAR | Status: DC | PRN

## 2016-06-16 MED ORDER — PNEUMOCOCCAL VAC POLYVALENT 25 MCG/0.5ML IJ INJ
0.5000 mL | INJECTION | INTRAMUSCULAR | Status: AC
  Administered 2016-06-17: 0.5 mL via INTRAMUSCULAR
  Filled 2016-06-16: qty 0.5

## 2016-06-16 MED ORDER — HYDROMORPHONE HCL 1 MG/ML IJ SOLN
INTRAMUSCULAR | Status: AC
  Administered 2016-06-16: 0.5 mg via INTRAVENOUS
  Filled 2016-06-16: qty 1

## 2016-06-16 MED ORDER — PROPOFOL 10 MG/ML IV BOLUS
INTRAVENOUS | Status: DC | PRN
  Administered 2016-06-16: 200 mg via INTRAVENOUS

## 2016-06-16 MED ORDER — CEFAZOLIN SODIUM-DEXTROSE 2-4 GM/100ML-% IV SOLN
INTRAVENOUS | Status: AC
  Filled 2016-06-16: qty 100

## 2016-06-16 MED ORDER — ROCURONIUM BROMIDE 100 MG/10ML IV SOLN
INTRAVENOUS | Status: DC | PRN
  Administered 2016-06-16: 60 mg via INTRAVENOUS

## 2016-06-16 MED ORDER — PHENYLEPHRINE HCL 10 MG/ML IJ SOLN
INTRAMUSCULAR | Status: DC | PRN
  Administered 2016-06-16: 80 ug via INTRAVENOUS

## 2016-06-16 MED ORDER — TRAZODONE HCL 150 MG PO TABS
300.0000 mg | ORAL_TABLET | Freq: Every day | ORAL | Status: DC
  Administered 2016-06-16: 300 mg via ORAL
  Filled 2016-06-16: qty 2

## 2016-06-16 MED ORDER — EPHEDRINE SULFATE 50 MG/ML IJ SOLN
INTRAMUSCULAR | Status: DC | PRN
  Administered 2016-06-16 (×2): 10 mg via INTRAVENOUS

## 2016-06-16 MED ORDER — PANTOPRAZOLE SODIUM 40 MG PO TBEC
40.0000 mg | DELAYED_RELEASE_TABLET | Freq: Every day | ORAL | Status: DC
  Administered 2016-06-16 – 2016-06-17 (×2): 40 mg via ORAL
  Filled 2016-06-16 (×2): qty 1

## 2016-06-16 MED ORDER — LACTATED RINGERS IV SOLN
INTRAVENOUS | Status: DC | PRN
  Administered 2016-06-16 (×2): via INTRAVENOUS

## 2016-06-16 MED ORDER — PROPOFOL 10 MG/ML IV BOLUS
INTRAVENOUS | Status: AC
  Filled 2016-06-16: qty 20

## 2016-06-16 MED ORDER — PROMETHAZINE HCL 25 MG/ML IJ SOLN
6.2500 mg | INTRAMUSCULAR | Status: DC | PRN
  Administered 2016-06-16: 6.25 mg via INTRAVENOUS

## 2016-06-16 MED ORDER — DEXAMETHASONE SODIUM PHOSPHATE 10 MG/ML IJ SOLN
INTRAMUSCULAR | Status: DC | PRN
  Administered 2016-06-16: 10 mg via INTRAVENOUS

## 2016-06-16 MED ORDER — BUPIVACAINE-EPINEPHRINE 0.25% -1:200000 IJ SOLN
INTRAMUSCULAR | Status: DC | PRN
  Administered 2016-06-16: 50 mL

## 2016-06-16 MED ORDER — INFLUENZA VAC SPLIT QUAD 0.5 ML IM SUSY
0.5000 mL | PREFILLED_SYRINGE | INTRAMUSCULAR | Status: AC
  Administered 2016-06-17: 0.5 mL via INTRAMUSCULAR
  Filled 2016-06-16: qty 0.5

## 2016-06-16 MED ORDER — HYDROMORPHONE HCL 1 MG/ML IJ SOLN
0.5000 mg | INTRAMUSCULAR | Status: DC | PRN
  Administered 2016-06-16: 1 mg via INTRAVENOUS
  Filled 2016-06-16 (×2): qty 1

## 2016-06-16 MED ORDER — BUPROPION HCL ER (XL) 150 MG PO TB24
150.0000 mg | ORAL_TABLET | Freq: Every day | ORAL | Status: DC
  Administered 2016-06-17: 150 mg via ORAL
  Filled 2016-06-16: qty 1

## 2016-06-16 MED ORDER — HYDROXYZINE HCL 25 MG PO TABS: 50.0000 mg | ORAL_TABLET | Freq: Four times a day (QID) | ORAL | Status: DC | PRN

## 2016-06-16 MED ORDER — ONDANSETRON HCL 4 MG/2ML IJ SOLN
INTRAMUSCULAR | Status: DC | PRN
  Administered 2016-06-16: 4 mg via INTRAVENOUS

## 2016-06-16 MED ORDER — SUGAMMADEX SODIUM 500 MG/5ML IV SOLN
INTRAVENOUS | Status: DC | PRN
  Administered 2016-06-16: 300 mg via INTRAVENOUS

## 2016-06-16 MED ORDER — HEPARIN SODIUM (PORCINE) 5000 UNIT/ML IJ SOLN
5000.0000 [IU] | Freq: Three times a day (TID) | INTRAMUSCULAR | Status: DC
  Administered 2016-06-17: 5000 [IU] via SUBCUTANEOUS
  Filled 2016-06-16: qty 1

## 2016-06-16 MED ORDER — SODIUM CHLORIDE 0.9 % IV SOLN
INTRAVENOUS | Status: DC
Start: 2016-06-16 — End: 2016-06-17
  Administered 2016-06-16: 13:00:00 via INTRAVENOUS

## 2016-06-16 MED ORDER — MEPERIDINE HCL 25 MG/ML IJ SOLN: 6.2500 mg | INTRAMUSCULAR | Status: DC | PRN

## 2016-06-16 MED ORDER — ONDANSETRON HCL 4 MG/2ML IJ SOLN: 4.0000 mg | Freq: Four times a day (QID) | INTRAMUSCULAR | Status: DC | PRN

## 2016-06-16 MED ORDER — FENTANYL CITRATE (PF) 100 MCG/2ML IJ SOLN
INTRAMUSCULAR | Status: DC | PRN
  Administered 2016-06-16 (×4): 50 ug via INTRAVENOUS

## 2016-06-16 MED ORDER — ALPRAZOLAM 0.5 MG PO TABS: 1.0000 mg | ORAL_TABLET | Freq: Every evening | ORAL | Status: DC | PRN

## 2016-06-16 MED ORDER — LAMOTRIGINE 25 MG PO TABS
200.0000 mg | ORAL_TABLET | Freq: Two times a day (BID) | ORAL | Status: DC
  Administered 2016-06-16 – 2016-06-17 (×2): 200 mg via ORAL
  Filled 2016-06-16 (×2): qty 8

## 2016-06-16 SURGICAL SUPPLY — 62 items
APL SKNCLS STERI-STRIP NONHPOA (GAUZE/BANDAGES/DRESSINGS) ×1
BENZOIN TINCTURE PRP APPL 2/3 (GAUZE/BANDAGES/DRESSINGS) ×2 IMPLANT
BLADE SURG 15 STRL LF DISP TIS (BLADE) IMPLANT
BLADE SURG 15 STRL SS (BLADE) ×2
BLADE SURG ROTATE 9660 (MISCELLANEOUS) IMPLANT
CANISTER SUCTION 2500CC (MISCELLANEOUS) ×2 IMPLANT
CHLORAPREP W/TINT 10.5 ML (MISCELLANEOUS) ×2 IMPLANT
CLIP TI MEDIUM 24 (CLIP) ×2 IMPLANT
CLIP TI WIDE RED SMALL 24 (CLIP) ×2 IMPLANT
CONT SPEC 4OZ CLIKSEAL STRL BL (MISCELLANEOUS) IMPLANT
COVER SURGICAL LIGHT HANDLE (MISCELLANEOUS) ×2 IMPLANT
CRADLE DONUT ADULT HEAD (MISCELLANEOUS) ×2 IMPLANT
DECANTER SPIKE VIAL GLASS SM (MISCELLANEOUS) ×1 IMPLANT
DRAPE LAPAROTOMY T 98X78 PEDS (DRAPES) ×2 IMPLANT
DRAPE UTILITY XL STRL (DRAPES) ×4 IMPLANT
ELECT COATED BLADE 2.86 ST (ELECTRODE) ×2 IMPLANT
ELECT REM PT RETURN 9FT ADLT (ELECTROSURGICAL) ×2
ELECTRODE REM PT RTRN 9FT ADLT (ELECTROSURGICAL) ×1 IMPLANT
GAUZE SPONGE 4X4 12PLY STRL (GAUZE/BANDAGES/DRESSINGS) ×2 IMPLANT
GAUZE SPONGE 4X4 16PLY XRAY LF (GAUZE/BANDAGES/DRESSINGS) ×2 IMPLANT
GLOVE BIO SURGEON STRL SZ7 (GLOVE) ×1 IMPLANT
GLOVE BIO SURGEON STRL SZ8 (GLOVE) ×2 IMPLANT
GLOVE BIOGEL M STRL SZ7.5 (GLOVE) ×2 IMPLANT
GLOVE BIOGEL PI IND STRL 7.0 (GLOVE) IMPLANT
GLOVE BIOGEL PI IND STRL 8 (GLOVE) ×1 IMPLANT
GLOVE BIOGEL PI IND STRL 8.5 (GLOVE) IMPLANT
GLOVE BIOGEL PI INDICATOR 7.0 (GLOVE) ×1
GLOVE BIOGEL PI INDICATOR 8 (GLOVE) ×2
GLOVE BIOGEL PI INDICATOR 8.5 (GLOVE) ×2
GLOVE SURG ORTHO 8.0 STRL STRW (GLOVE) ×1 IMPLANT
GOWN STRL REUS W/ TWL LRG LVL3 (GOWN DISPOSABLE) ×2 IMPLANT
GOWN STRL REUS W/ TWL XL LVL3 (GOWN DISPOSABLE) IMPLANT
GOWN STRL REUS W/TWL 2XL LVL3 (GOWN DISPOSABLE) ×2 IMPLANT
GOWN STRL REUS W/TWL LRG LVL3 (GOWN DISPOSABLE) ×2
GOWN STRL REUS W/TWL XL LVL3 (GOWN DISPOSABLE) ×4
HEMOSTAT SURGICEL 2X14 (HEMOSTASIS) ×2 IMPLANT
KIT BASIN OR (CUSTOM PROCEDURE TRAY) ×2 IMPLANT
KIT ROOM TURNOVER OR (KITS) ×2 IMPLANT
MARKER SKIN DUAL TIP RULER LAB (MISCELLANEOUS) ×1 IMPLANT
NDL HYPO 25GX1X1/2 BEV (NEEDLE) ×1 IMPLANT
NEEDLE HYPO 25GX1X1/2 BEV (NEEDLE) ×2 IMPLANT
NS IRRIG 1000ML POUR BTL (IV SOLUTION) ×2 IMPLANT
PACK SURGICAL SETUP 50X90 (CUSTOM PROCEDURE TRAY) ×2 IMPLANT
PAD ARMBOARD 7.5X6 YLW CONV (MISCELLANEOUS) ×4 IMPLANT
PENCIL BUTTON HOLSTER BLD 10FT (ELECTRODE) ×2 IMPLANT
SHEARS HARMONIC 9CM CVD (BLADE) ×2 IMPLANT
SPECIMEN JAR MEDIUM (MISCELLANEOUS) IMPLANT
SPONGE GAUZE 4X4 12PLY STER LF (GAUZE/BANDAGES/DRESSINGS) ×1 IMPLANT
SPONGE INTESTINAL PEANUT (DISPOSABLE) ×2 IMPLANT
STAPLER VISISTAT 35W (STAPLE) IMPLANT
STRIP CLOSURE SKIN 1/2X4 (GAUZE/BANDAGES/DRESSINGS) ×2 IMPLANT
SUT MNCRL AB 4-0 PS2 18 (SUTURE) ×2 IMPLANT
SUT SILK 2 0 (SUTURE) ×2
SUT SILK 2-0 18XBRD TIE 12 (SUTURE) ×1 IMPLANT
SUT VIC AB 3-0 SH 18 (SUTURE) ×3 IMPLANT
SYR BULB 3OZ (MISCELLANEOUS) ×2 IMPLANT
SYR CONTROL 10ML LL (SYRINGE) ×2 IMPLANT
TAPE CLOTH SURG 4X10 WHT LF (GAUZE/BANDAGES/DRESSINGS) ×1 IMPLANT
TOWEL OR 17X24 6PK STRL BLUE (TOWEL DISPOSABLE) ×2 IMPLANT
TOWEL OR 17X26 10 PK STRL BLUE (TOWEL DISPOSABLE) ×2 IMPLANT
TUBE CONNECTING 12X1/4 (SUCTIONS) ×2 IMPLANT
WATER STERILE IRR 1000ML POUR (IV SOLUTION) ×1 IMPLANT

## 2016-06-16 NOTE — Transfer of Care (Signed)
Immediate Anesthesia Transfer of Care Note  Patient: Becky Lawson  Procedure(s) Performed: Procedure(s): LEFT THYROID LOBECTOMY (Left)  Patient Location: PACU  Anesthesia Type:General  Level of Consciousness: awake, alert  and oriented  Airway & Oxygen Therapy: Patient Spontanous Breathing and Patient connected to nasal cannula oxygen  Post-op Assessment: Report given to RN, Post -op Vital signs reviewed and stable and Patient moving all extremities X 4  Post vital signs: Reviewed and stable  Last Vitals:  Vitals:   06/16/16 0638  BP: (!) 178/77  Pulse: 85  Resp: 20  Temp: 36.7 C    Last Pain:  Vitals:   06/16/16 0713  TempSrc:   PainSc: 8       Patients Stated Pain Goal: 0 (88/87/57 9728)  Complications: No apparent anesthesia complications

## 2016-06-16 NOTE — Anesthesia Procedure Notes (Signed)
Procedure Name: Intubation Date/Time: 06/16/2016 8:14 AM Performed by: Neldon Newport Pre-anesthesia Checklist: Timeout performed, Patient being monitored, Suction available, Emergency Drugs available and Patient identified Patient Re-evaluated:Patient Re-evaluated prior to inductionOxygen Delivery Method: Circle system utilized Preoxygenation: Pre-oxygenation with 100% oxygen Intubation Type: IV induction Ventilation: Mask ventilation without difficulty Laryngoscope Size: Mac and 3 Grade View: Grade I Tube type: Oral Tube size: 7.0 mm Number of attempts: 1 Placement Confirmation: breath sounds checked- equal and bilateral,  positive ETCO2 and ETT inserted through vocal cords under direct vision Secured at: 22 cm Tube secured with: Tape Dental Injury: Teeth and Oropharynx as per pre-operative assessment

## 2016-06-16 NOTE — Brief Op Note (Signed)
06/16/2016  10:22 AM  PATIENT:  Becky Lawson  65 y.o. female  PRE-OPERATIVE DIAGNOSIS:  LEFT THYROID FOLLICULAR LESION  POST-OPERATIVE DIAGNOSIS:  LEFT THYROID FOLLICULAR LESION  PROCEDURE:  Procedure(s): LEFT THYROID LOBECTOMY (Left)  SURGEON:  Surgeon(s) and Role:    * Armandina Gemma, MD - Assisting    * Greer Pickerel, MD - Primary  PHYSICIAN ASSISTANT:   ASSISTANTS: see above   ANESTHESIA:   general  EBL:  Total I/O In: 1250 [I.V.:1250] Out: 4.8 [Blood:4.8]  BLOOD ADMINISTERED:none  DRAINS: none   LOCAL MEDICATIONS USED:  MARCAINE     SPECIMEN:  Source of Specimen:  left thyroid lobe, stitch superior pole  DISPOSITION OF SPECIMEN:  PATHOLOGY  COUNTS:  YES  TOURNIQUET:  * No tourniquets in log *  DICTATION: .Other Dictation: Dictation Number [id number]  PLAN OF CARE: Admit for overnight observation  PATIENT DISPOSITION:  PACU - hemodynamically stable.   Delay start of Pharmacological VTE agent (>24hrs) due to surgical blood loss or risk of bleeding: no  Leighton Ruff. Redmond Pulling, MD, FACS General, Bariatric, & Minimally Invasive Surgery [facility], [place]

## 2016-06-16 NOTE — Anesthesia Postprocedure Evaluation (Signed)
Anesthesia Post Note  Patient: Becky Lawson  Procedure(s) Performed: Procedure(s) (LRB): LEFT THYROID LOBECTOMY (Left)  Patient location during evaluation: PACU Anesthesia Type: General Level of consciousness: awake and alert Pain management: pain level controlled Vital Signs Assessment: post-procedure vital signs reviewed and stable Respiratory status: spontaneous breathing, nonlabored ventilation, respiratory function stable and patient connected to nasal cannula oxygen Cardiovascular status: blood pressure returned to baseline and stable Postop Assessment: no signs of nausea or vomiting Anesthetic complications: no    Last Vitals:  Vitals:   06/16/16 1100 06/16/16 1134  BP:  (!) 152/79  Pulse: 83 87  Resp: 14 15  Temp:  36.6 C    Last Pain:  Vitals:   06/16/16 1134  TempSrc: Oral  PainSc:                  Tiajuana Amass

## 2016-06-16 NOTE — H&P (Signed)
Becky Lawson is an 65 y.o. female.   Chief Complaint: here for surgery HPI: The patient is a 65 year old female who presents with a thyroid nodule. She is referred by Dr Brigitte Pulse for evaluation of an abnormal thyroid nodule. She saw her primary care doctor in late June with vague complaints about weight gain, depression worsening, dry skin and fatigue. Her thyroid function was within normal limits. Thyroid ultrasound revealed an echogenic calcified nodule in the right lobe measuring 0.6 x 0.5 x 0.7 cm. She was also found to have a partially calcified nodule along the medial left thyroid lobe measuring 1.1 x 0.6 x 0.7 cm. The nodule margins are slightly irregular and may be related to the calcifications. There are no enlarged lymph nodes. She underwent an FNA biopsy in late August of the enlarged left thyroid nodule. 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 She denies any prior neck irradiation. She denies any family history of pancreatic or thyroid malignancies or other soft tissue cancers. She takes chronic pain medicine for spinal stenosis. She does not have any radiculopathy. She denies any chest pain, chest pressure, source of breath, orthopnea, TIAs or amaurosis fugax.  Denies changes since seen in clinic Very concerned about postop pain control - I have zero pain tolerance  Past Medical History:  Diagnosis Date  . Anxiety   . Arthritis   . Bipolar 1 disorder ([place])   . Chronic kidney disease   . Depression   . GERD (gastroesophageal reflux disease)   . Heart murmur   . Hepatitis C    resolved completely after treatment in 2014, genotype 1b, followed at [facility]  . Hyperlipidemia   . Hypertension   . Jaundice 11/01/2012  . Pruritic disorder 02/13/2013  . Sleep apnea    Was diagnosed approximately 20 years ago, but does not wear CPAP patient stated "I gave it back.Marland KitchenMarland KitchenI couldn't wear that thing it was horrible"  . Spinal stenosis     Past Surgical History:   Procedure Laterality Date  . ABDOMINAL HYSTERECTOMY    . COLONOSCOPY W/ POLYPECTOMY    . JOINT REPLACEMENT Bilateral    knee  . KNEE ARTHROSCOPY Bilateral   . PARTIAL HYSTERECTOMY      Family History  Problem Relation Age of Onset  . Diabetes Mother   . Heart disease Mother   . Hyperlipidemia Mother   . Hypertension Mother   . Stroke Mother   . Hypertension Father   . Parkinson's disease Father   . Heart disease Father   . Hyperlipidemia Father   . Hemachromatosis Daughter   . Thyroid disease Neg Hx    Social History:  reports that she has quit smoking. She has never used smokeless tobacco. She reports that she drinks about 0.6 oz of alcohol per week . She reports that she does not use drugs.  Allergies:  Allergies  Allergen Reactions  . No Known Allergies     Medications Prior to Admission  Medication Sig Dispense Refill  . ALPRAZolam (XANAX) 1 MG tablet Take 1 mg by mouth at bedtime as needed for anxiety.    [name] amLODipine (NORVASC) 10 MG tablet Take 1 tablet (10 mg total) by mouth daily. (Patient taking differently: Take 10 mg by mouth every evening. ) 90 tablet 3  . buPROPion (WELLBUTRIN XL) 150 MG 24 hr tablet Take 150 mg by mouth daily.     . hydrochlorothiazide (HYDRODIURIL) 25 MG tablet TAKE 1 BY MOUTH DAILY (Patient taking  differently: Take 25 mg by mouth daily. ) 90 tablet 1  . HYDROcodone-acetaminophen (NORCO) 10-325 MG tablet Take 1 tablet by mouth 3 (three) times daily.     . hydrOXYzine (ATARAX/VISTARIL) 25 MG tablet Take 2 tablets (50 mg total) by mouth every 6 (six) hours as needed. PATIENT NEEDS OFFICE VISIT FOR ADDITIONAL REFILLS (Patient taking differently: Take 50 mg by mouth every 6 (six) hours as needed for anxiety or itching. PATIENT NEEDS OFFICE VISIT FOR ADDITIONAL REFILLS) 40 tablet 0  . lamoTRIgine (LAMICTAL) 200 MG tablet Take 200 mg by mouth 2 (two) times daily.    [name] omeprazole (PRILOSEC) 40 MG capsule TAKE 1 BY MOUTH DAILY (Patient taking  differently: Take 40 mg by mouth every evening. ) 90 capsule 1  . OXcarbazepine (TRILEPTAL) 150 MG tablet Take 300 mg by mouth at bedtime.     . traZODone (DESYREL) 100 MG tablet Take 300 mg by mouth at bedtime.     . colesevelam (WELCHOL) 625 MG tablet Take 1 tablet (625 mg total) by mouth 2 (two) times daily with a meal. (Patient not taking: Reported on 06/07/2016) 180 tablet 3  . ergocalciferol (VITAMIN D2) 50000 units capsule Take 1 capsule (50,000 Units total) by mouth once a week. 24 capsule 0  . Omega-3 Fatty Acids (FISH OIL ULTRA) 1400 MG CAPS Take 1,400 mg by mouth daily.    [name] spironolactone (ALDACTONE) 25 MG tablet Take 1 tablet (25 mg total) by mouth daily. (Patient not taking: Reported on 06/07/2016) 90 tablet 1    No results found for this or any previous visit (from the past 48 hour(s)). No results found.  Review of Systems  Constitutional: Negative for weight loss.  HENT: Negative for nosebleeds.   Eyes: Negative for blurred vision.  Respiratory: Negative for shortness of breath.   Cardiovascular: Negative for chest pain, palpitations, orthopnea and PND.       Denies DOE  Genitourinary: Negative for dysuria and hematuria.  Musculoskeletal: Negative.   Skin: Negative for itching and rash.  Neurological: Negative for dizziness, focal weakness, seizures, loss of consciousness and headaches.       Denies TIAs, amaurosis fugax  Endo/Heme/Allergies: Does not bruise/bleed easily.  Psychiatric/Behavioral: The patient is not nervous/anxious.     Blood pressure (!) 178/77, pulse 85, temperature 98.1 F (36.7 C), temperature source Oral, resp. rate 20, height 5\' 4"  (1.626 m), weight 89.8 kg (198 lb), SpO2 98 %. Physical Exam  Vitals reviewed. Constitutional: She is oriented to person, place, and time. She appears well-developed and well-nourished. No distress.  HENT:  Head: Normocephalic and atraumatic.  Right Ear: External ear normal.  Left Ear: External ear normal.  Eyes:  Conjunctivae are normal. No scleral icterus.  Neck: Normal range of motion. Neck supple. No tracheal deviation present. No thyromegaly present.  Cardiovascular: Normal rate and normal heart sounds.   Respiratory: Effort normal and breath sounds normal. No stridor. No respiratory distress. She has no wheezes.  Musculoskeletal: She exhibits no edema or tenderness.  Lymphadenopathy:    She has no cervical adenopathy.  Neurological: She is alert and oriented to person, place, and time. She exhibits normal muscle tone.  Skin: Skin is warm and dry. No rash noted. She is not diaphoretic. No erythema. No pallor.  Psychiatric: She has a normal mood and affect. Her behavior is normal. Judgment and thought content normal.     Assessment/Plan Left thyroid follicular nodule [place] IV HTN  To OR for left thyroid lobectomy All questions  asked and answered Discussed postop pain control  Leighton Ruff. Redmond Pulling, MD, [place], Bariatric, & Minimally Invasive Surgery [facility], [place]   Gayland Curry, MD 06/16/2016, 7:51 AM

## 2016-06-16 NOTE — Progress Notes (Signed)
MD paged due to patient requesting medication for a migraine. Awaiting a return call.

## 2016-06-17 ENCOUNTER — Encounter (HOSPITAL_COMMUNITY): Payer: Self-pay | Admitting: General Surgery

## 2016-06-17 DIAGNOSIS — C73 Malignant neoplasm of thyroid gland: Secondary | ICD-10-CM | POA: Diagnosis not present

## 2016-06-17 LAB — CBC
HCT: 36.8 % (ref 36.0–46.0)
Hemoglobin: 12.9 g/dL (ref 12.0–15.0)
MCH: 31.2 pg (ref 26.0–34.0)
MCHC: 35.1 g/dL (ref 30.0–36.0)
MCV: 89.1 fL (ref 78.0–100.0)
Platelets: 209 10*3/uL (ref 150–400)
RBC: 4.13 MIL/uL (ref 3.87–5.11)
RDW: 12.5 % (ref 11.5–15.5)
WBC: 8.1 10*3/uL (ref 4.0–10.5)

## 2016-06-17 LAB — BASIC METABOLIC PANEL
Anion gap: 9 (ref 5–15)
BUN: 5 mg/dL — ABNORMAL LOW (ref 6–20)
CALCIUM: 8.6 mg/dL — AB (ref 8.9–10.3)
CO2: 27 mmol/L (ref 22–32)
CREATININE: 0.73 mg/dL (ref 0.44–1.00)
Chloride: 94 mmol/L — ABNORMAL LOW (ref 101–111)
GFR calc non Af Amer: >60 mL/min (ref >60)
Glucose, Bld: 122 mg/dL — ABNORMAL HIGH (ref 65–99)
Potassium: 3.6 mmol/L (ref 3.5–5.1)
SODIUM: 130 mmol/L — AB (ref 135–145)

## 2016-06-17 MED ORDER — CALCIUM CARBONATE-VITAMIN D 500-200 MG-UNIT PO TABS: 1.0000 | ORAL_TABLET | Freq: Three times a day (TID) | ORAL | Status: DC

## 2016-06-17 NOTE — Op Note (Signed)
NAMEMarland Kitchen  NADEGE, CARRIGER NO.:  [id number]  MEDICAL RECORD NO.:  [id number]  LOCATION:  6N21C                        FACILITY:  [facility]  PHYSICIAN:  Leighton Ruff. Redmond Pulling, MD, FACSDATE OF BIRTH:  December 07, 1950  DATE OF PROCEDURE:  06/16/2016 DATE OF DISCHARGE:                              OPERATIVE REPORT   PREOPERATIVE DIAGNOSIS:  Left thyroid follicular nodule.  POSTOPERATIVE DIAGNOSIS:  Left thyroid follicular nodule.  PROCEDURE:  Left thyroid lobectomy.  SURGEON:  Leighton Ruff. Redmond Pulling, MD, FACS.  ASSISTANT:  Earnstine Regal, MD  ANESTHESIA:  General.  ESTIMATED BLOOD LOSS:  Minimal.  SPECIMENS:  Left thyroid lobe, stitch marks, superior lobe.  INDICATIONS FOR PROCEDURE:  The patient is a pleasant 65 year old female, who was found to have a left thyroid nodule measuring 1 x 0.6 x 0.7 cm on ultrasound after presenting to her primary care doctor in late June with complaints of depression and fatigue and dry skin.  An FNA biopsy was performed, which demonstrated follicular neoplasm Bethesda classification IV.  Based on this, I recommended left thyroid lobectomy. We discussed at length the risks and benefits of the procedure.  Please see my outpatient note for that discussion.  DESCRIPTION OF PROCEDURE:  After marking the left side in holding area, the patient was taken back to OR #1 at [facility], placed supine on the operating room table.  General endotracheal anesthesia was established.  Sequential compression devices were placed.  Her arms were tucked at her side with appropriate padding.  Her neck was slightly hyperextended.  Her neck and upper chest were prepped and draped in a usual standard surgical fashion with ChloraPrep.  She received IV Ancef prior to skin incision.  A surgical time-out was performed.  A transverse 6 cm incision was made about 1.5 cm above her sternal notch and a natural skin crease with a 15 blade.  Deep dermis was divided  with electrocautery.  The platysmal muscle was divided as well.  The subplatysmal flaps were then raised superiorly and inferiorly with the Bovie electrocautery.  A self-retaining Mahorner retractor was placed. The fascia overlying the midline strap muscle was divided and the left strap muscle was lifted up off the left thyroid gland with Bovie electrocautery.  We then mobilized some of the inferior pole of the thyroid, staying right close to it with a combination of right angle, Harmonic Scalpel, and clips.  I then turned my attention to the superior pole.  It was mobilized sequentially with a combination of right angle dissection, small clips, and Harmonic Scalpel.  The tubercle of Zuckerkandl was identified and preserved and mobilized.  This allowed [place] a sort of roll the superior pull down.  The superior parathyroid was identified and mobilized away from the thyroid gland.  We then took down the middle thyroid blood supply, staying very close to the thyroid with the right angle and clips and Harmonic.  The recurrent laryngeal nerve was identified and preserved.  We stayed very close to it on top of the thyroid gland.  I was able to roll the thyroid gland medially.  We then took the remaining posterior portion of the thyroid gland off  the windpipe with electrocautery.  We then turned our attention back down to the inferior pole and isolated it again in a similar fashion.  The inferior parathyroid gland was not identified.  I think it divided the isthmus with Harmonic Scalpel.  The superior pole was marked with a marking stitch.  The nodule was palpable in the midportion of the thyroid gland posterior to the superior pole, it felt hard and calcified.  There was no palpable lymphadenopathy anywhere in the neck cavity.  The right thyroid gland was palpated and felt smooth without any significant palpable nodules or irregularities.  The nerve was reinspected and it was still intact.  We  then irrigated the neck cavity. There was excellent hemostasis.  Several pieces of cut Surgicel were placed in the cavity.  I then reapproximated the strap muscles with multiple figure-of-eight 3-0 Vicryl sutures.  I then infiltrated local in the skin and subcutaneous tissue.  The platysma muscle was then reapproximated with inverted interrupted 3-0 Vicryl sutures.  The skin was then closed with a running 4-0 Monocryl in a subcuticular fashion followed by application of benzoin, Steri-Strips, gauze and tape.  The patient was extubated and taken to the recovery room in stable condition.  All needle, instrument and sponge counts were correct x2. There were no immediate complications.  The patient tolerated the procedure well.     Leighton Ruff. Redmond Pulling, MD, FACS     EMW/MEDQ  D:  06/16/2016  T:  06/17/2016  Job:  [id number]  cc:   Hilliard Clark A. Loanne Drilling, MD Dr. Brigitte Pulse

## 2016-06-17 NOTE — Discharge Summary (Signed)
1 Day Post-Op  Subjective: Reports that she did well overnight with the exception of pain management. States she was not written for enough pain medication. States that this morning her pain is under much better control. Would like to be discharged home but states that she does not want to leave without speaking with Dr. Redmond Pulling prior to discharge.   Objective: Vital signs in last 24 hours: Temp:  [97.8 F (36.6 C)-98.6 F (37 C)] 98.1 F (36.7 C) (10/13 0547) Pulse Rate:  [65-92] 65 (10/13 0547) Resp:  [12-18] 16 (10/13 0547) BP: (117-155)/(60-96) 128/64 (10/13 0547) SpO2:  [95 %-100 %] 100 % (10/13 0547) Last BM Date: 06/16/16  Intake/Output from previous day: 10/12 0701 - 10/13 0700 In: 1990 [P.O.:240; I.V.:1750] Out: 4.8 [Blood:4.8] Intake/Output this shift: No intake/output data recorded.  PE: General: pleasant, WD, WN white female who is laying in bed in NAD HEENT: head is normocephalic, atraumatic.  Sclera are noninjected.  PERRL.  Ears and nose without any masses or lesions.  Mouth is pink and moist. Surgical incision at anterior neck is clean, dry and intact with sterile tape closure Heart: regular, rate, and rhythm.  Normal s1,s2. No obvious murmurs, gallops, or rubs noted.  Lungs: CTAB, no wheezes, rhonchi, or rales noted.  Respiratory effort nonlabored Abd: soft, NT, ND, +BS. MS: all 4 extremities are symmetrical with no cyanosis, clubbing, or edema. Skin: warm and dry with no masses, lesions, or rashes Psych: A&Ox3 with an appropriate affect.  Lab Results:   Recent Labs  06/17/16 0527  WBC 8.1  HGB 12.9  HCT 36.8  PLT 209   BMET  Recent Labs  06/17/16 0527  NA 130*  K 3.6  CL 94*  CO2 27  GLUCOSE 122*  BUN 5*  CREATININE 0.73  CALCIUM 8.6*   PT/INR No results for input(s): LABPROT, INR in the last 72 hours. CMP     Component Value Date/Time   NA 130 (L) 06/17/2016 0527   K 3.6 06/17/2016 0527   CL 94 (L) 06/17/2016 0527   CO2 27 06/17/2016  0527   GLUCOSE 122 (H) 06/17/2016 0527   BUN 5 (L) 06/17/2016 0527   CREATININE 0.73 06/17/2016 0527   CREATININE 0.83 02/25/2016 1706   CALCIUM 8.6 (L) 06/17/2016 0527   PROT 7.1 06/09/2016 0901   ALBUMIN 4.5 06/09/2016 0901   AST 24 06/09/2016 0901   ALT 17 06/09/2016 0901   ALKPHOS 65 06/09/2016 0901   BILITOT 0.9 06/09/2016 0901   GFRNONAA >60 06/17/2016 0527   GFRAA >60 06/17/2016 0527   Lipase  No results found for: LIPASE     Studies/Results: No results found.  Anti-infectives: Anti-infectives    Start     Dose/Rate Route Frequency Ordered Stop   06/16/16 0703  ceFAZolin (ANCEF) IVPB 2g/100 mL premix     2 g 200 mL/hr over 30 Minutes Intravenous On call to O.R. 06/16/16 0703 06/16/16 0823   06/16/16 0547  ceFAZolin (ANCEF) 2-4 GM/100ML-% IVPB    Comments:  Becky Lawson   : cabinet override      06/16/16 0547 06/16/16 0823       Assessment/Plan  POD#1 S/P Left thyroid lobectomy. Doing well postoperatively. Surg path. Pending. Will likely be discharged to home today. Patient requests to meet with Dr. Redmond Pulling prior to discharge.   LOS: 0 days    Becky Lawson, [facility] 06/17/2016, 10:18 AM

## 2016-06-17 NOTE — Discharge Instructions (Signed)
[facility], [place] [phone]  THYROID/ PARATHYROID SURGERY: POST OP INSTRUCTIONS  Always review your discharge instruction sheet given to you by the facility where your surgery was performed.  IF YOU HAVE DISABILITY OR FAMILY LEAVE FORMS, YOU MUST BRING THEM TO THE OFFICE FOR PROCESSING.  PLEASE DO NOT GIVE THEM TO YOUR DOCTOR.  1. A prescription for pain medication may be given to you upon discharge.  Take your pain medication as prescribed, if needed.  If narcotic pain medicine is not needed, then you may take acetaminophen (Tylenol) or ibuprofen (Advil) as needed. 2. Take your usually prescribed medications unless otherwise directed. 3. If you need a refill on your pain medication, please contact your pharmacy. They will contact our office to request authorization.  Prescriptions will not be filled after 5pm or on week-ends. 4. You should follow a light diet the first 24 hours after arrival home, such as soup and crackers, etc.  Be sure to include lots of fluids daily.  Resume your normal diet the day after surgery. 5. Most patients will experience some swelling and bruising on the chest and neck area.  Ice packs will help.  Swelling and bruising can take several days to resolve.  6. It is common to experience some constipation if taking pain medication after surgery.  Increasing fluid intake and taking a stool softener will usually help or prevent this problem from occurring.  A mild laxative (Milk of Magnesia or Miralax) should be taken according to package directions if there are no bowel movements after 48 hours. 7. Unless discharge instructions indicate otherwise, you may remove your bandages 24-48 hours after surgery, and you may shower at that time.  You have steri-strips (small skin tapes) in place directly over the incision.  These strips should be left on the skin for 7-10 days.  8. ACTIVITIES:  You may resume regular (light) daily activities beginning the next  day--such as daily self-care, walking, climbing stairs--gradually increasing activities as tolerated.  You may have sexual intercourse when it is comfortable.  Refrain from any heavy lifting or straining until approved by your doctor. a. You may drive when you no longer are taking prescription pain medication, you can comfortably wear a seatbelt, and you can safely maneuver your car and apply brakes b. RETURN TO WORK:  __________________________________________________________ 9. You should see your doctor in the office for a follow-up appointment approximately two weeks after your surgery.  Make sure that you call for this appointment within a day or two after you arrive home to insure a convenient appointment time. 10. OTHER INSTRUCTIONS: ____________________________________________________________________________ _________________________________________________________________________________________________________________ _________________________________________________________________________________________________________________   WHEN TO CALL YOUR DOCTOR: 1. Fever over 101.0 2. Inability to urinate 3. Nausea and/or vomiting 4. Extreme swelling or bruising 5. Continued bleeding from incision. 6. Increased pain, redness, or drainage from the incision. 7. Difficulty swallowing or breathing 8. Muscle cramping or spasms. 9. Numbness or tingling in hands or feet or around lips.  The clinic staff is available to answer your questions during regular business hours.  Please dont hesitate to call and ask to speak to one of the nurses if you have concerns.  For further questions, please visit [email or web address]

## 2016-06-24 ENCOUNTER — Ambulatory Visit (INDEPENDENT_AMBULATORY_CARE_PROVIDER_SITE_OTHER): Payer: PPO | Admitting: Endocrinology

## 2016-06-24 ENCOUNTER — Encounter: Payer: Self-pay | Admitting: Endocrinology

## 2016-06-24 ENCOUNTER — Ambulatory Visit
Admission: RE | Admit: 2016-06-24 | Discharge: 2016-06-24 | Disposition: A | Discharge status: Home / Self Care | Payer: PPO | Source: Ambulatory Visit | Attending: Endocrinology | Admitting: Endocrinology

## 2016-06-24 DIAGNOSIS — R05 Cough: Secondary | ICD-10-CM | POA: Diagnosis not present

## 2016-06-24 DIAGNOSIS — R6889 Other general symptoms and signs: Secondary | ICD-10-CM | POA: Insufficient documentation

## 2016-06-24 DIAGNOSIS — R0989 Other specified symptoms and signs involving the circulatory and respiratory systems: Secondary | ICD-10-CM | POA: Insufficient documentation

## 2016-06-24 DIAGNOSIS — R059 Cough, unspecified: Secondary | ICD-10-CM

## 2016-06-24 MED ORDER — LEVOTHYROXINE SODIUM 75 MCG PO TABS: 75.0000 ug | ORAL_TABLET | Freq: Every day | ORAL | 3 refills | Status: DC

## 2016-06-24 NOTE — Progress Notes (Signed)
Subjective:    Patient ID: Becky Lawson, female    DOB: 1950/12/12, 65 y.o.   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  She had left thyroid lobectomy 2 weeks ago (pathol shows TUMOR FOCALLY LESS THAN 0.1 CM FROM SURGICAL MARGIN). She reports ongoing slight swelling at the neck incision site, and assoc fatigue.   Past Medical History:  Diagnosis Date  . Anxiety   . Arthritis   . Bipolar 1 disorder ([place])   . Chronic kidney disease   . Depression   . GERD (gastroesophageal reflux disease)   . Heart murmur   . 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 Hyperlipidemia   . Hypertension   . Jaundice 11/01/2012  . Pruritic disorder 02/13/2013  . Sleep apnea    Was diagnosed approximately 20 years ago, but does not wear CPAP patient stated "I gave it back.Marland KitchenMarland KitchenI couldn't wear that thing it was horrible"  . Spinal stenosis     Past Surgical History:  Procedure Laterality Date  . ABDOMINAL HYSTERECTOMY    . COLONOSCOPY W/ POLYPECTOMY    . JOINT REPLACEMENT Bilateral    knee  . KNEE ARTHROSCOPY Bilateral   . PARTIAL HYSTERECTOMY    . THYROID LOBECTOMY Left 06/16/2016  . THYROID LOBECTOMY Left 06/16/2016   Procedure: LEFT THYROID LOBECTOMY;  Surgeon: Greer Pickerel, MD;  Location: [facility];  Service: General;  Laterality: Left;    Social History   Social History  . Marital status: Divorced    Spouse name: N/A  . Number of children: N/A  . Years of education: N/A   Occupational History  . Personal Caregiver    Social History Main Topics  . Smoking status: Former Research scientist (life sciences)  . Smokeless tobacco: Never Used  . Alcohol use 0.6 oz/week    1 Standard drinks or equivalent per week     Comment: 1 glass of red wine daily  . Drug use: No  . Sexual activity: Yes    Birth control/ protection: None   Other Topics Concern  . Not on file   Social History Narrative   Divorced. Education: [employer]. Exercise: No.     Current Outpatient Prescriptions on File Prior to Visit  Medication Sig Dispense Refill  . ALPRAZolam (XANAX) 1 MG tablet Take 1 mg by mouth at bedtime as needed for anxiety.    [name] amLODipine (NORVASC) 10 MG tablet Take 1 tablet (10 mg total) by mouth daily. (Patient taking differently: Take 10 mg by mouth every evening. ) 90 tablet 3  . buPROPion (WELLBUTRIN XL) 150 MG 24 hr tablet Take 150 mg by mouth daily.     . calcium-vitamin D (OSCAL WITH D) 500-200 MG-UNIT tablet Take 1 tablet by mouth 3 (three) times daily.    . colesevelam (WELCHOL) 625 MG tablet Take 1 tablet (625 mg total) by mouth 2 (two) times daily with a meal. 180 tablet 3  . ergocalciferol (VITAMIN D2) 50000 units capsule Take 1 capsule (50,000 Units total) by mouth once a week. 24 capsule 0  . hydrochlorothiazide (HYDRODIURIL) 25 MG tablet TAKE 1 BY MOUTH DAILY (Patient taking differently: Take 25 mg by mouth daily. ) 90 tablet 1  . HYDROcodone-acetaminophen (NORCO) 10-325 MG tablet Take 1 tablet by mouth 3 (three) times daily.     . hydrOXYzine (ATARAX/VISTARIL) 25 MG tablet Take 2 tablets (50 mg total) by mouth every 6 (six) hours as  needed. PATIENT NEEDS OFFICE VISIT FOR ADDITIONAL REFILLS (Patient taking differently: Take 50 mg by mouth every 6 (six) hours as needed for anxiety or itching. PATIENT NEEDS OFFICE VISIT FOR ADDITIONAL REFILLS) 40 tablet 0  . lamoTRIgine (LAMICTAL) 200 MG tablet Take 200 mg by mouth 2 (two) times daily.    . Omega-3 Fatty Acids (FISH OIL ULTRA) 1400 MG CAPS Take 1,400 mg by mouth daily.    [name] omeprazole (PRILOSEC) 40 MG capsule TAKE 1 BY MOUTH DAILY (Patient taking differently: Take 40 mg by mouth every evening. ) 90 capsule 1  . OXcarbazepine (TRILEPTAL) 150 MG tablet Take 300 mg by mouth at bedtime.     [name] spironolactone (ALDACTONE) 25 MG tablet Take 1 tablet (25 mg total) by mouth daily. 90 tablet 1  . traZODone (DESYREL) 100 MG tablet Take 300 mg by mouth at bedtime.      No current  facility-administered medications on file prior to visit.     Allergies  Allergen Reactions  . No Known Allergies     Family History  Problem Relation Age of Onset  . Diabetes Mother   . Heart disease Mother   . Hyperlipidemia Mother   . Hypertension Mother   . Stroke Mother   . Hypertension Father   . Parkinson's disease Father   . Heart disease Father   . Hyperlipidemia Father   . Hemachromatosis Daughter   . Thyroid disease Neg Hx     BP 136/78   Pulse 75   Ht 5\' 4"  (1.626 m)   Wt 197 lb (89.4 kg)   SpO2 98%   BMI 33.81 kg/m   Review of Systems Neck pain is improving.  She has decreased appetite.  She has a slightly prod cough, but no wheezing/fever/sob    Objective:   Physical Exam VITAL SIGNS:  See vs page GENERAL: no distress Neck: (I removed steri-strips): a healing scar is present.  Minimal swelling and minimal tenderness.  No drainage.  I do not appreciate a nodule in the thyroid or elsewhere in the neck LUNGS:  Clear to auscultation.    Assessment & Plan:  Papillary adenocarcinoma of the thyroid, new.   Fatigue and other sxs, prob not thyroid-related.   Cough, new, uncertain etiology. See PCP if persists.    Patient is advised the following: Patient Instructions  A chest x-tray is requested for you today.  We'll let you know about the results. I have sent a prescription to your pharmacy, for a thyroid hormone pill. Please come back for a follow-up appointment in 6 weeks No treatment is needed for the thyroid, except for [place] to re-examine your neck in the future.

## 2016-06-24 NOTE — Patient Instructions (Signed)
A chest x-tray is requested for you today.  We'll let you know about the results. I have sent a prescription to your pharmacy, for a thyroid hormone pill. Please come back for a follow-up appointment in 6 weeks No treatment is needed for the thyroid, except for [place] to re-examine your neck in the future.

## 2016-06-28 ENCOUNTER — Ambulatory Visit: Payer: PPO

## 2016-06-29 ENCOUNTER — Telehealth: Payer: Self-pay | Admitting: Emergency Medicine

## 2016-06-29 ENCOUNTER — Telehealth: Payer: Self-pay

## 2016-06-29 ENCOUNTER — Ambulatory Visit (INDEPENDENT_AMBULATORY_CARE_PROVIDER_SITE_OTHER): Payer: PPO | Admitting: Physician Assistant

## 2016-06-29 VITALS — BP 160/80 | HR 87 | Temp 98.3°F | Resp 16 | Ht 64.5 in | Wt 197.0 lb

## 2016-06-29 DIAGNOSIS — R55 Syncope and collapse: Secondary | ICD-10-CM

## 2016-06-29 DIAGNOSIS — R42 Dizziness and giddiness: Secondary | ICD-10-CM

## 2016-06-29 LAB — POC MICROSCOPIC URINALYSIS (UMFC): Mucus: ABSENT

## 2016-06-29 LAB — POCT URINALYSIS DIP (MANUAL ENTRY)
Bilirubin, UA: NEGATIVE
Glucose, UA: NEGATIVE
Ketones, POC UA: NEGATIVE
Leukocytes, UA: NEGATIVE
Nitrite, UA: NEGATIVE
Protein Ur, POC: 100 — AB
Spec Grav, UA: 1.015
Urobilinogen, UA: 0.2
pH, UA: 7.5

## 2016-06-29 LAB — POCT CBC
Granulocyte percent: 80.7 %G — AB (ref 37–80)
HCT, POC: 38.2 % (ref 37.7–47.9)
HEMOGLOBIN: 14.2 g/dL (ref 12.2–16.2)
Lymph, poc: 1.6 (ref 0.6–3.4)
MCH: 32.8 pg — AB (ref 27–31.2)
MCHC: 37.2 g/dL — AB (ref 31.8–35.4)
MCV: 88.2 fL (ref 80–97)
MID (cbc): 0.2 (ref 0–0.9)
MPV: 6.6 fL (ref 0–99.8)
PLATELET COUNT, POC: 237 10*3/uL (ref 142–424)
POC Granulocyte: 7.5 — AB (ref 2–6.9)
POC LYMPH PERCENT: 17.6 %L (ref 10–50)
POC MID %: 1.7 %M (ref 0–12)
RBC: 4.33 M/uL (ref 4.04–5.48)
RDW, POC: 11.7 %
WBC: 9.3 10*3/uL (ref 4.6–10.2)

## 2016-06-29 MED ORDER — MECLIZINE HCL 25 MG PO TABS: 25.0000 mg | ORAL_TABLET | Freq: Three times a day (TID) | ORAL | 0 refills | Status: DC | PRN

## 2016-06-29 NOTE — Progress Notes (Signed)
Patient ID: Becky Lawson, female    DOB: Jun 04, 1951, 65 y.o.   MRN: [id number]  PCP: [name], MD  Subjective:   Chief Complaint  Patient presents with  . Dizziness    HPI Presents for evaluation of dizziness and near syncope today.  She has a history of BPPV, and her episodes have typically resolved with home Epley maneuver or PT. These episodes have been intermittent since a skull fracture in 2013.  This episode began about 3 days ago and her home exercises haven't worked. Until today, this felt like one of her typical episodes, without any new or concerning features. Today she was at work when the dizziness worsened and she fell ("controlled fall") with the sensation that she might faint. She had some blurry vision and felt lightheaded. She did not lose consciousness, and did not hit her head or injure herself in any way when she fell. No weakness, facial droop, slurred speech, confusion, loss of bowel or bladder control.  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 She has done well since the surgery and has tolerated the new medication well. She has experienced a nagging cough since her surgery, thought to be due to irritation from intubation. CXR 06/24/2016 ordered by her endocrinologist was normal.   She initially said that she had called her PT office to schedule a visit for treatment of this episode and that she was told that she was not a patient of record.  I contacted the office by phone and was told that the patient had just then called them, and was told that they were booked today, but could work her in tomorrow.      Review of Systems Constitutional: Negative for chills, diaphoresis and fever.  HENT: Negative for congestion, sinus pressure, sneezing and trouble swallowing.   Respiratory: Positive for cough. Negative for chest tightness and shortness of breath.   Cardiovascular:  Negative for chest pain and palpitations.  Gastrointestinal: Negative for abdominal pain, blood in stool, constipation, diarrhea, nausea and vomiting.  Genitourinary: Negative for dysuria, frequency, hematuria and urgency.  Musculoskeletal: Negative for back pain, neck pain and neck stiffness.  Skin: Negative for pallor and rash.  Neurological: Positive for dizziness and light-headedness. Negative for tremors, seizures, syncope, facial asymmetry, speech difficulty, weakness, numbness and headaches.     Patient Active Problem List   Diagnosis Date Noted  . Cough 06/24/2016  . S/P partial thyroidectomy 06/16/2016  . Multinodular goiter 06/02/2016  . History of hepatitis C 05/19/2015  . BPPV (benign paroxysmal positional vertigo) 05/19/2015  . Vitamin D deficiency 05/19/2015  . Spinal stenosis of lumbar region 05/19/2015  . Hyperlipidemia 05/19/2015  . Bipolar disorder ([place]) 05/19/2015  . Essential hypertension 09/26/2013  . Undiagnosed cardiac murmurs 09/26/2013     Prior to Admission medications   Medication Sig Start Date End Date Taking? Authorizing Provider  ALPRAZolam Duanne Moron) 1 MG tablet Take 1 mg by mouth at bedtime as needed for anxiety.   Yes Historical Provider, MD  amLODipine (NORVASC) 10 MG tablet Take 1 tablet (10 mg total) by mouth daily. Patient taking differently: Take 10 mg by mouth every evening.  12/01/15  Yes Leandrew Koyanagi, MD  buPROPion (WELLBUTRIN XL) 150 MG 24 hr tablet Take 150 mg by mouth daily.  05/13/16  Yes Historical Provider, MD  calcium-vitamin D (OSCAL WITH D) 500-200 MG-UNIT tablet Take 1 tablet by mouth 3 (three) times daily. 06/17/16  Yes Greer Pickerel, MD  colesevelam [facility]) 625 MG tablet Take 1 tablet (625 mg total) by mouth 2 (two) times daily with a meal. 02/25/14  Yes Shawnee Knapp, MD  ergocalciferol (VITAMIN D2) 50000 units capsule Take 1 capsule (50,000 Units total) by mouth once a week. 02/25/16  Yes Shawnee Knapp, MD  hydrochlorothiazide  (HYDRODIURIL) 25 MG tablet TAKE 1 BY MOUTH DAILY Patient taking differently: Take 25 mg by mouth daily.  02/25/16  Yes Shawnee Knapp, MD  HYDROcodone-acetaminophen (NORCO) 10-325 MG tablet Take 1 tablet by mouth 3 (three) times daily.    Yes Historical Provider, MD  hydrOXYzine (ATARAX/VISTARIL) 25 MG tablet Take 2 tablets (50 mg total) by mouth every 6 (six) hours as needed. PATIENT NEEDS OFFICE VISIT FOR ADDITIONAL REFILLS Patient taking differently: Take 50 mg by mouth every 6 (six) hours as needed for anxiety or itching. PATIENT NEEDS OFFICE VISIT FOR ADDITIONAL REFILLS 05/14/15  Yes Shawnee Knapp, MD  lamoTRIgine (LAMICTAL) 200 MG tablet Take 200 mg by mouth 2 (two) times daily.   Yes Historical Provider, MD  levothyroxine (SYNTHROID, LEVOTHROID) 75 MCG tablet Take 1 tablet (75 mcg total) by mouth daily before breakfast. 06/24/16  Yes Renato Shin, MD  Omega-3 Fatty Acids (FISH OIL ULTRA) 1400 MG CAPS Take 1,400 mg by mouth daily.   Yes Historical Provider, MD  omeprazole (PRILOSEC) 40 MG capsule TAKE 1 BY MOUTH DAILY Patient taking differently: Take 40 mg by mouth every evening.  02/25/16  Yes Shawnee Knapp, MD  OXcarbazepine (TRILEPTAL) 150 MG tablet Take 300 mg by mouth at bedtime.  04/26/16  Yes Historical Provider, MD  spironolactone (ALDACTONE) 25 MG tablet Take 1 tablet (25 mg total) by mouth daily. 02/25/16  Yes Shawnee Knapp, MD  traZODone (DESYREL) 100 MG tablet Take 300 mg by mouth at bedtime.  03/09/16  Yes Historical Provider, MD     Allergies  Allergen Reactions  . No Known Allergies        Objective:  Physical Exam  Constitutional: She is oriented to person, place, and time. She appears well-developed and well-nourished. She is active and cooperative. No distress.  BP (!) 160/80 (BP Location: Left Arm, Patient Position: Sitting, Cuff Size: Normal)   Pulse 87   Temp 98.3 F (36.8 C) (Oral)   Resp 16   Ht 5' 4.5" (1.638 m)   Wt 197 lb (89.4 kg)   SpO2 96%   BMI 33.29 kg/m   HENT:    Head: Normocephalic and atraumatic.  Right Ear: Hearing and external ear normal.  Left Ear: Hearing and external ear normal.  Nose: Nose normal.  Mouth/Throat: Oropharynx is clear and moist.  Eyes: Conjunctivae and EOM are normal. Pupils are equal, round, and reactive to light. No scleral icterus.  Neck: Normal range of motion. Neck supple. No thyromegaly (nicely healing surgical scar without erythema, edema or induration) present.  Cardiovascular: Normal rate, regular rhythm and normal heart sounds.   Pulses:      Radial pulses are 2+ on the right side, and 2+ on the left side.  Pulmonary/Chest: Effort normal and breath sounds normal.  Lymphadenopathy:       Head (right side): No tonsillar, no preauricular, no posterior auricular and no occipital adenopathy present.       Head (left side): No tonsillar, no preauricular, no posterior auricular and no occipital adenopathy present.    She has no cervical adenopathy.       Right: No supraclavicular  adenopathy present.       Left: No supraclavicular adenopathy present.  Neurological: She is alert and oriented to person, place, and time. No sensory deficit.  Skin: Skin is warm, dry and intact. No rash noted. No cyanosis or erythema. Nails show no clubbing.  Psychiatric: She has a normal mood and affect. Her speech is normal and behavior is normal.      EKG reviewed with Dr. Wendi Snipes. No ischemia. No arrhythmia. Compared to EKG 02/25/2016, unchanged.    Results for orders placed or performed in visit on 06/29/16  POCT CBC  Result Value Ref Range   WBC 9.3 4.6 - 10.2 K/uL   Lymph, poc 1.6 0.6 - 3.4   POC LYMPH PERCENT 17.6 10 - 50 %L   MID (cbc) 0.2 0 - 0.9   POC MID % 1.7 0 - 12 %M   POC Granulocyte 7.5 (A) 2 - 6.9   Granulocyte percent 80.7 (A) 37 - 80 %G   RBC 4.33 4.04 - 5.48 M/uL   Hemoglobin 14.2 12.2 - 16.2 g/dL   HCT, POC 38.2 37.7 - 47.9 %   MCV 88.2 80 - 97 fL   MCH, POC 32.8 (A) 27 - 31.2 pg   MCHC 37.2 (A) 31.8 - 35.4  g/dL   RDW, POC 11.7 %   Platelet Count, POC 237 142 - 424 K/uL   MPV 6.6 0 - 99.8 fL  POCT urinalysis dipstick  Result Value Ref Range   Color, UA yellow yellow   Clarity, UA clear clear   Glucose, UA negative negative   Bilirubin, UA negative negative   Ketones, POC UA negative negative   Spec Grav, UA 1.015    Blood, UA trace-lysed (A) negative   pH, UA 7.5    Protein Ur, POC =100 (A) negative   Urobilinogen, UA 0.2    Nitrite, UA Negative Negative   Leukocytes, UA Negative Negative  POCT Microscopic Urinalysis ([facility])  Result Value Ref Range   WBC,UR,HPF,POC None None WBC/hpf   RBC,UR,HPF,POC None None RBC/hpf   Bacteria None None, Too numerous to count   Mucus Absent Absent   Epithelial Cells, UR Per Microscopy None None, Too numerous to count cells/hpf         Assessment & Plan:   1. Dizziness No cause found separate from previously diagnosed BPPV. Continue with home exercises and proceed with PT visit tomorrow. She can use meclizine if desired, with caution that it causes drowsiness. - meclizine (ANTIVERT) 25 MG tablet; Take 1 tablet (25 mg total) by mouth 3 (three) times daily as needed for dizziness.  Dispense: 30 tablet; Refill: 0  2. Near syncope No cause identified. Unclear if this was truly near syncope or if the BPPV was severe enough that she lost balance. Hydrate well, rest.  - POCT CBC - POCT urinalysis dipstick - POCT Microscopic Urinalysis ([facility]) - EKG 12-Lead   Fara Chute, PA-C Physician Assistant-Certified [facility]

## 2016-06-29 NOTE — Telephone Encounter (Signed)
Pt is needing to talk with someone about getting something for vertigo called in    Best number [phone]

## 2016-06-29 NOTE — Patient Instructions (Signed)
     IF you received an x-ray today, you will receive an invoice from [facility]. Please contact [facility] at [phone] with questions or concerns regarding your invoice.   IF you received labwork today, you will receive an invoice from [facility]. Please contact [facility] at [phone] with questions or concerns regarding your invoice.   Our billing staff will not be able to assist you with questions regarding bills from these companies.  You will be contacted with the lab results as soon as they are available. The fastest way to get your results is to activate your My Chart account. Instructions are located on the last page of this paperwork. If you have not heard from us regarding the results in 2 weeks, please contact this office.      

## 2016-06-29 NOTE — Telephone Encounter (Signed)
Left a message informing patient, we can not prescribe medication for vertigo until office visit Scheduled to see Dr. Brigitte Pulse 07/02/16

## 2016-06-29 NOTE — Progress Notes (Signed)
Subjective:    Patient ID: Becky Lawson, female    DOB: 1951-08-10, 65 y.o.   MRN: [id number]  HPI: Present for dizziness which started 3 days ago. 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 Denies hitting her head when she fell. Associated symptoms include blurry vision and lightheadedness with the episode but denies loss of consciousness or syncope. Denies facial drooping, speech difficulty, loss of bowel or bladder control, numbness or tingling, seizure activity, or weakness. Patient has a history of BPPV, which was discovered after skull fracture in 2013, and states this dizziness feels like the typical dizziness she gets with her BPPV.  She has exercises to do on her own at home to resolve these symptoms but recently had a partial lobectomy and was worried by doing the exercises she would cause damage to her incision site. However, she did try to do it while at work today but states the dizziness was unrelieved by the exercises. States when that has happened in the past she usually goes to [facility] to have them help her with the exercises, however she has not had to go there in a while and had trouble with setting up an appt.  History of papillary adenocarcinoma of thyroid for which she recently (06/16/16) had a left thyroid lobectomy. States she has had a persistent cough since her surgery for which she was seen by endocrinology for on 06/24/16 and CXR performed then was normal. Began Levothyroxine 75 mcg at that time.    PMH also includes Bipolar I disorder, HTN, HLD, Hep. C, and spinal stenosis. Denies history of DM, seizure disorders.  Review of Systems  Constitutional: Negative for chills, diaphoresis and fever.  HENT: Negative for congestion, sinus pressure, sneezing and trouble swallowing.   Respiratory: Positive for cough. Negative for chest tightness and shortness of breath.   Cardiovascular:  Negative for chest pain and palpitations.  Gastrointestinal: Negative for abdominal pain, blood in stool, constipation, diarrhea, nausea and vomiting.  Genitourinary: Negative for dysuria, frequency, hematuria and urgency.  Musculoskeletal: Negative for back pain, neck pain and neck stiffness.  Skin: Negative for pallor and rash.  Neurological: Positive for dizziness and light-headedness. Negative for tremors, seizures, syncope, facial asymmetry, speech difficulty, weakness, numbness and headaches.   Allergies  Allergen Reactions  . No Known Allergies    Prior to Admission medications   Medication Sig Start Date End Date Taking? Authorizing Provider  ALPRAZolam Duanne Moron) 1 MG tablet Take 1 mg by mouth at bedtime as needed for anxiety.   Yes Historical Provider, MD  amLODipine (NORVASC) 10 MG tablet Take 1 tablet (10 mg total) by mouth daily. Patient taking differently: Take 10 mg by mouth every evening.  12/01/15  Yes Leandrew Koyanagi, MD  buPROPion (WELLBUTRIN XL) 150 MG 24 hr tablet Take 150 mg by mouth daily.  05/13/16  Yes Historical Provider, MD  calcium-vitamin D (OSCAL WITH D) 500-200 MG-UNIT tablet Take 1 tablet by mouth 3 (three) times daily. 06/17/16  Yes Greer Pickerel, MD  colesevelam [facility]) 625 MG tablet Take 1 tablet (625 mg total) by mouth 2 (two) times daily with a meal. 02/25/14  Yes Shawnee Knapp, MD  ergocalciferol (VITAMIN D2) 50000 units capsule Take 1 capsule (50,000 Units total) by mouth once a week. 02/25/16  Yes Shawnee Knapp, MD  hydrochlorothiazide (HYDRODIURIL) 25 MG tablet TAKE 1 BY MOUTH DAILY Patient taking differently:  Take 25 mg by mouth daily.  02/25/16  Yes Shawnee Knapp, MD  HYDROcodone-acetaminophen (NORCO) 10-325 MG tablet Take 1 tablet by mouth 3 (three) times daily.    Yes Historical Provider, MD  hydrOXYzine (ATARAX/VISTARIL) 25 MG tablet Take 2 tablets (50 mg total) by mouth every 6 (six) hours as needed. PATIENT NEEDS OFFICE VISIT FOR ADDITIONAL REFILLS Patient  taking differently: Take 50 mg by mouth every 6 (six) hours as needed for anxiety or itching. PATIENT NEEDS OFFICE VISIT FOR ADDITIONAL REFILLS 05/14/15  Yes Shawnee Knapp, MD  lamoTRIgine (LAMICTAL) 200 MG tablet Take 200 mg by mouth 2 (two) times daily.   Yes Historical Provider, MD  levothyroxine (SYNTHROID, LEVOTHROID) 75 MCG tablet Take 1 tablet (75 mcg total) by mouth daily before breakfast. 06/24/16  Yes Renato Shin, MD  Omega-3 Fatty Acids (FISH OIL ULTRA) 1400 MG CAPS Take 1,400 mg by mouth daily.   Yes Historical Provider, MD  omeprazole (PRILOSEC) 40 MG capsule TAKE 1 BY MOUTH DAILY Patient taking differently: Take 40 mg by mouth every evening.  02/25/16  Yes Shawnee Knapp, MD  OXcarbazepine (TRILEPTAL) 150 MG tablet Take 300 mg by mouth at bedtime.  04/26/16  Yes Historical Provider, MD  spironolactone (ALDACTONE) 25 MG tablet Take 1 tablet (25 mg total) by mouth daily. 02/25/16  Yes Shawnee Knapp, MD  traZODone (DESYREL) 100 MG tablet Take 300 mg by mouth at bedtime.  03/09/16  Yes Historical Provider, MD  meclizine (ANTIVERT) 25 MG tablet Take 1 tablet (25 mg total) by mouth 3 (three) times daily as needed for dizziness. 06/29/16   Harrison Mons, PA-C   Patient Active Problem List   Diagnosis Date Noted  . Cough 06/24/2016  . S/P partial thyroidectomy 06/16/2016  . Multinodular goiter 06/02/2016  . History of hepatitis C 05/19/2015  . BPPV (benign paroxysmal positional vertigo) 05/19/2015  . Vitamin D deficiency 05/19/2015  . Spinal stenosis of lumbar region 05/19/2015  . Hyperlipidemia 05/19/2015  . Bipolar disorder ([place]) 05/19/2015  . Essential hypertension 09/26/2013  . Undiagnosed cardiac murmurs 09/26/2013        Objective: Blood pressure (!) 160/80, pulse 87, temperature 98.3 F (36.8 C), temperature source Oral, resp. rate 16, height 5' 4.5" (1.638 m), weight 197 lb (89.4 kg), SpO2 96 %.   Physical Exam  Constitutional: She is oriented to person, place, and time. She appears  well-developed and well-nourished. No distress.  HENT:  Head: Normocephalic and atraumatic.  Eyes: Conjunctivae are normal. Pupils are equal, round, and reactive to light.  Neck: Normal range of motion. Neck supple. No tracheal deviation present.  Thyroid lobectomy incision noted, appears to be healing well. No erythema, swelling, drainage from site.  Cardiovascular: Normal rate, regular rhythm and normal heart sounds.   No murmur heard. Pulmonary/Chest: Effort normal and breath sounds normal. No stridor. No respiratory distress. She has no wheezes. She has no rales.  Lymphadenopathy:    She has no cervical adenopathy.  Neurological: She is alert and oriented to person, place, and time. She has normal strength and normal reflexes. She displays no atrophy and no tremor. No cranial nerve deficit or sensory deficit. She exhibits normal muscle tone. She displays a negative Romberg sign. She displays no seizure activity. Coordination normal. GCS eye subscore is 4. GCS verbal subscore is 5. GCS motor subscore is 6.  Slightly unsteady while walking, needs assistance.  Skin: Skin is warm and dry. She is not diaphoretic.  Psychiatric: She has a  normal mood and affect. Her behavior is normal.   Results for orders placed or performed in visit on 06/29/16  POCT CBC  Result Value Ref Range   WBC 9.3 4.6 - 10.2 K/uL   Lymph, poc 1.6 0.6 - 3.4   POC LYMPH PERCENT 17.6 10 - 50 %L   MID (cbc) 0.2 0 - 0.9   POC MID % 1.7 0 - 12 %M   POC Granulocyte 7.5 (A) 2 - 6.9   Granulocyte percent 80.7 (A) 37 - 80 %G   RBC 4.33 4.04 - 5.48 M/uL   Hemoglobin 14.2 12.2 - 16.2 g/dL   HCT, POC 38.2 37.7 - 47.9 %   MCV 88.2 80 - 97 fL   MCH, POC 32.8 (A) 27 - 31.2 pg   MCHC 37.2 (A) 31.8 - 35.4 g/dL   RDW, POC 11.7 %   Platelet Count, POC 237 142 - 424 K/uL   MPV 6.6 0 - 99.8 fL  POCT urinalysis dipstick  Result Value Ref Range   Color, UA yellow yellow   Clarity, UA clear clear   Glucose, UA negative negative     Bilirubin, UA negative negative   Ketones, POC UA negative negative   Spec Grav, UA 1.015    Blood, UA trace-lysed (A) negative   pH, UA 7.5    Protein Ur, POC =100 (A) negative   Urobilinogen, UA 0.2    Nitrite, UA Negative Negative   Leukocytes, UA Negative Negative  POCT Microscopic Urinalysis ([facility])  Result Value Ref Range   WBC,UR,HPF,POC None None WBC/hpf   RBC,UR,HPF,POC None None RBC/hpf   Bacteria None None, Too numerous to count   Mucus Absent Absent   Epithelial Cells, UR Per Microscopy None None, Too numerous to count cells/hpf   EKG: Normal sinus rhythm, 82 bpm, non-specific T waves changes but unchanged in comparison to EKG on 6/22/201.      Assessment & Plan:  1. Dizziness Antivert 25 mg prescription given and instructions reviewed. [facility] was contacted and appointment was scheduled. Patient to follow-up there this week. - meclizine (ANTIVERT) 25 MG tablet; Take 1 tablet (25 mg total) by mouth 3 (three) times daily as needed for dizziness.  Dispense: 30 tablet; Refill: 0  2. Near syncope Labs and EKG all within normal limits. No findings to suggest dizziness, syncope. Discussed with patient symptoms likely due to BPPV and recommended follow-up with [facility].  - POCT CBC - POCT urinalysis dipstick - POCT Microscopic Urinalysis ([facility]) - EKG 12-Lead

## 2016-07-02 ENCOUNTER — Ambulatory Visit (INDEPENDENT_AMBULATORY_CARE_PROVIDER_SITE_OTHER): Payer: PPO | Admitting: Family Medicine

## 2016-07-02 VITALS — BP 122/72 | HR 84 | Temp 98.6°F | Resp 17 | Ht 64.5 in | Wt 186.0 lb

## 2016-07-02 DIAGNOSIS — R809 Proteinuria, unspecified: Secondary | ICD-10-CM | POA: Diagnosis not present

## 2016-07-02 DIAGNOSIS — E785 Hyperlipidemia, unspecified: Secondary | ICD-10-CM | POA: Diagnosis not present

## 2016-07-02 DIAGNOSIS — R110 Nausea: Secondary | ICD-10-CM | POA: Diagnosis not present

## 2016-07-02 DIAGNOSIS — E871 Hypo-osmolality and hyponatremia: Secondary | ICD-10-CM | POA: Diagnosis not present

## 2016-07-02 LAB — LIPID PANEL
CHOLESTEROL: 245 mg/dL — AB (ref 125–200)
HDL: 53 mg/dL (ref >=46)
LDL Cholesterol: 174 mg/dL — ABNORMAL HIGH (ref <130)
Total CHOL/HDL Ratio: 4.6 Ratio (ref <=5.0)
Triglycerides: 88 mg/dL (ref <150)
VLDL: 18 mg/dL (ref <30)

## 2016-07-02 LAB — COMPREHENSIVE METABOLIC PANEL
ALK PHOS: 66 U/L (ref 33–130)
ALT: 21 U/L (ref 6–29)
AST: 37 U/L — ABNORMAL HIGH (ref 10–35)
Albumin: 4.3 g/dL (ref 3.6–5.1)
BUN: 6 mg/dL — AB (ref 7–25)
CHLORIDE: 86 mmol/L — AB (ref 98–110)
CO2: 27 mmol/L (ref 20–31)
Calcium: 9.2 mg/dL (ref 8.6–10.4)
Creat: 0.75 mg/dL (ref 0.50–0.99)
Glucose, Bld: 107 mg/dL — ABNORMAL HIGH (ref 65–99)
Potassium: 3.3 mmol/L — ABNORMAL LOW (ref 3.5–5.3)
SODIUM: 123 mmol/L — AB (ref 135–146)
TOTAL PROTEIN: 6.4 g/dL (ref 6.1–8.1)
Total Bilirubin: 0.5 mg/dL (ref 0.2–1.2)

## 2016-07-02 LAB — CBC
HCT: 38.3 % (ref 35.0–45.0)
Hemoglobin: 13.9 g/dL (ref 11.7–15.5)
MCH: 32 pg (ref 27.0–33.0)
MCHC: 36.3 g/dL — ABNORMAL HIGH (ref 32.0–36.0)
MCV: 88.2 fL (ref 80.0–100.0)
MPV: 8.6 fL (ref 7.5–12.5)
PLATELETS: 234 10*3/uL (ref 140–400)
RBC: 4.34 MIL/uL (ref 3.80–5.10)
RDW: 11.9 % (ref 11.0–15.0)
WBC: 5.6 10*3/uL (ref 3.8–10.8)

## 2016-07-02 LAB — POCT URINALYSIS DIP (MANUAL ENTRY)
Bilirubin, UA: NEGATIVE
Glucose, UA: NEGATIVE
Ketones, POC UA: NEGATIVE
NITRITE UA: NEGATIVE
PH UA: 6.5
Spec Grav, UA: 1.015
Urobilinogen, UA: 0.2

## 2016-07-02 LAB — LIPASE: Lipase: 12 U/L (ref 7–60)

## 2016-07-02 MED ORDER — PROMETHAZINE HCL 25 MG PO TABS: 25.0000 mg | ORAL_TABLET | Freq: Three times a day (TID) | ORAL | 0 refills | Status: DC | PRN

## 2016-07-02 NOTE — Patient Instructions (Signed)
     IF you received an x-ray today, you will receive an invoice from [facility]. Please contact [facility] at [phone] with questions or concerns regarding your invoice.   IF you received labwork today, you will receive an invoice from [facility]. Please contact [facility] at [phone] with questions or concerns regarding your invoice.   Our billing staff will not be able to assist you with questions regarding bills from these companies.  You will be contacted with the lab results as soon as they are available. The fastest way to get your results is to activate your My Chart account. Instructions are located on the last page of this paperwork. If you have not heard from us regarding the results in 2 weeks, please contact this office.      

## 2016-07-02 NOTE — Progress Notes (Signed)
Subjective:  By signing my name below, I, Becky Lawson, attest that this documentation has been prepared under the direction and in the presence of [name], MD.  Electronically Signed: [name], ED Scribe. 07/02/2016. 10:32 AM.   Patient ID: Becky Lawson, female    DOB: Jun 11, 1951, 65 y.o.   MRN: [id number]  HPI Chief Complaint  Patient presents with  . Nausea  . high cholesterol    HPI Comments: Becky Lawson is a 65 y.o. female who presents to the [facility] complaining of nausea for about a month. Reports nausea started prior to thyroid lobectomy but worsened afterwards. She has not been nauseated for the past 3-4 days. She reports associated decreased appetite, pushing herself to eat about 2 Lawson meals a day, constipation. She reports improvement to nausea while taking meclizine. She reports new medication of levothyroxine and trileptal. She denies abdominal pain, diarrhea, urinary frequency, hematuria. vaginal discharge, vaginal bleeding, abdominal distention.   Pt is fasting. She would like hepatitis testing and cholesterol.   Patient Active Problem List   Diagnosis Date Noted  . Cough 06/24/2016  . S/P partial thyroidectomy 06/16/2016  . Multinodular goiter 06/02/2016  . History of hepatitis C 05/19/2015  . BPPV (benign paroxysmal positional vertigo) 05/19/2015  . Vitamin D deficiency 05/19/2015  . Spinal stenosis of lumbar region 05/19/2015  . Hyperlipidemia 05/19/2015  . Bipolar disorder ([name]) 05/19/2015  . Essential hypertension 09/26/2013  . Undiagnosed cardiac murmurs 09/26/2013   Past Medical History:  Diagnosis Date  . Anxiety   . Arthritis   . Bipolar 1 disorder ([place])   . Chronic kidney disease   . Depression   . GERD (gastroesophageal reflux disease)   . Heart murmur   . Hepatitis C    resolved completely after treatment in 2014, genotype 1b, followed at [facility]  . Hyperlipidemia   . Hypertension   . Jaundice 11/01/2012  . Pruritic  disorder 02/13/2013  . Sleep apnea    Was diagnosed approximately 20 years ago, but does not wear CPAP patient stated "I gave it back.Marland KitchenMarland KitchenI couldn't wear that thing it was horrible"  . Spinal stenosis    Past Surgical History:  Procedure Laterality Date  . ABDOMINAL HYSTERECTOMY    . COLONOSCOPY W/ POLYPECTOMY    . JOINT REPLACEMENT Bilateral    knee  . KNEE ARTHROSCOPY Bilateral   . PARTIAL HYSTERECTOMY    . THYROID LOBECTOMY Left 06/16/2016  . THYROID LOBECTOMY Left 06/16/2016   Procedure: LEFT THYROID LOBECTOMY;  Surgeon: Greer Pickerel, MD;  Location: [facility];  Service: General;  Laterality: Left;   Allergies  Allergen Reactions  . No Known Allergies    Prior to Admission medications   Medication Sig Start Date End Date Taking? Authorizing Provider  ALPRAZolam Duanne Moron) 1 MG tablet Take 1 mg by mouth at bedtime as needed for anxiety.   Yes Historical Provider, MD  amLODipine (NORVASC) 10 MG tablet Take 1 tablet (10 mg total) by mouth daily. Patient taking differently: Take 10 mg by mouth every evening.  12/01/15  Yes Leandrew Koyanagi, MD  buPROPion (WELLBUTRIN XL) 150 MG 24 hr tablet Take 150 mg by mouth daily.  05/13/16  Yes Historical Provider, MD  calcium-vitamin D (OSCAL WITH D) 500-200 MG-UNIT tablet Take 1 tablet by mouth 3 (three) times daily. 06/17/16  Yes Greer Pickerel, MD  colesevelam [facility]) 625 MG tablet Take 1 tablet (625 mg total) by mouth 2 (two) times daily with a meal. 02/25/14  Yes Shawnee Knapp, MD  ergocalciferol (VITAMIN D2) 50000 units capsule Take 1 capsule (50,000 Units total) by mouth once a week. 02/25/16  Yes Shawnee Knapp, MD  hydrochlorothiazide (HYDRODIURIL) 25 MG tablet TAKE 1 BY MOUTH DAILY Patient taking differently: Take 25 mg by mouth daily.  02/25/16  Yes Shawnee Knapp, MD  HYDROcodone-acetaminophen (NORCO) 10-325 MG tablet Take 1 tablet by mouth 3 (three) times daily.    Yes Historical Provider, MD  hydrOXYzine (ATARAX/VISTARIL) 25 MG tablet Take 2 tablets (50 mg  total) by mouth every 6 (six) hours as needed. PATIENT NEEDS OFFICE VISIT FOR ADDITIONAL REFILLS Patient taking differently: Take 50 mg by mouth every 6 (six) hours as needed for anxiety or itching. PATIENT NEEDS OFFICE VISIT FOR ADDITIONAL REFILLS 05/14/15  Yes Shawnee Knapp, MD  lamoTRIgine (LAMICTAL) 200 MG tablet Take 200 mg by mouth 2 (two) times daily.   Yes Historical Provider, MD  levothyroxine (SYNTHROID, LEVOTHROID) 75 MCG tablet Take 1 tablet (75 mcg total) by mouth daily before breakfast. 06/24/16  Yes Renato Shin, MD  meclizine (ANTIVERT) 25 MG tablet Take 1 tablet (25 mg total) by mouth 3 (three) times daily as needed for dizziness. 06/29/16  Yes Chelle Jeffery, PA-C  Omega-3 Fatty Acids (FISH OIL ULTRA) 1400 MG CAPS Take 1,400 mg by mouth daily.   Yes Historical Provider, MD  omeprazole (PRILOSEC) 40 MG capsule TAKE 1 BY MOUTH DAILY Patient taking differently: Take 40 mg by mouth every evening.  02/25/16  Yes Shawnee Knapp, MD  OXcarbazepine (TRILEPTAL) 150 MG tablet Take 300 mg by mouth at bedtime.  04/26/16  Yes Historical Provider, MD  spironolactone (ALDACTONE) 25 MG tablet Take 1 tablet (25 mg total) by mouth daily. 02/25/16  Yes Shawnee Knapp, MD  traZODone (DESYREL) 100 MG tablet Take 300 mg by mouth at bedtime.  03/09/16  Yes Historical Provider, MD   Social History   Social History  . Marital status: Divorced    Spouse name: N/A  . Number of children: N/A  . Years of education: N/A   Occupational History  . Personal Caregiver    Social History Main Topics  . Smoking status: Former Smoker    Packs/day: 1.00    Years: 15.00    Quit date: 06/30/1979  . Smokeless tobacco: Never Used  . Alcohol use 0.6 oz/week    1 Standard drinks or equivalent per week     Comment: 1 glass of red wine daily  . Drug use: No  . Sexual activity: Yes    Birth control/ protection: None   Other Topics Concern  . Not on file   Social History Narrative   Divorced. Education: [employer]. Exercise:  No.   Review of Systems  Constitutional: Positive for appetite change. Negative for chills and fever.  Respiratory: Negative for chest tightness and shortness of breath.   Cardiovascular: Negative for chest pain, palpitations and leg swelling.  Gastrointestinal: Positive for constipation and nausea. Negative for abdominal distention, abdominal pain, anal bleeding, blood in stool, diarrhea and vomiting.  Genitourinary: Negative for difficulty urinating, dysuria, frequency, hematuria, urgency, vaginal bleeding and vaginal discharge.  Neurological: Positive for dizziness, light-headedness and headaches. Negative for tremors and weakness.  Psychiatric/Behavioral: The patient is not nervous/anxious.     Objective:   Physical Exam  Constitutional: She is oriented to person, place, and time. Vital signs are normal. She appears well-developed and well-nourished. No distress.  HENT:  Right Ear: Tympanic membrane normal.  Left  Ear: Tympanic membrane normal.  Cardiovascular: Normal rate and regular rhythm.   Murmur heard.  Systolic ( left upper sternal border) murmur is present with a grade of 3/6  Pulmonary/Chest: Effort normal and breath sounds normal.  Abdominal: Soft. Bowel sounds are increased. There is no hepatosplenomegaly. There is no tenderness. There is no CVA tenderness.  Neurological: She is alert and oriented to person, place, and time.  Skin: Skin is warm and dry.  Psychiatric: She has a normal mood and affect. Her behavior is normal.    Vitals:   07/02/16 1024  BP: 122/72  Pulse: 84  Resp: 17  Temp: 98.6 F (37 C)  TempSrc: Oral  SpO2: 97%  Weight: 186 lb (84.4 kg)  Height: 5' 4.5" (1.638 m)   Assessment & Plan:   1. Nausea without vomiting - likely due to the hyponatremia and dizziness from trileptal.  2. Hyperlipidemia, unspecified hyperlipidemia type   3.      Hyponatremia and proteinuria- likely coming from the new trileptal - pt does agree that her nausea seemed  to start shortly after this.   She has been taking oxcarbazepine 150mg  tabs 3 tabs qhs. Advised pt to decrease to 150mg  po qhs (then after 3 days she can stop).  Stop hctz. Ok to cont spironolactone. Only drink when she is thirsty, add some salt into diet. If she feels any worse, HA, nausea, confusion -> call 911.  If she is improving, recheck with me Tues a.m. - in 2d - to recheck stat bmp, serum osm, u dip, urine osm.  Pt agrees.  Orders Placed This Encounter  Procedures  . CBC  . Comprehensive metabolic panel    Order Specific Question:   Has the patient fasted?    Answer:   Yes  . Lipid panel    Order Specific Question:   Has the patient fasted?    Answer:   Yes  . Lipase  . Hepatitis C RNA quantitative  . POCT urinalysis dipstick    Meds ordered this encounter  Medications  . promethazine (PHENERGAN) 25 MG tablet    Sig: Take 1 tablet (25 mg total) by mouth every 8 (eight) hours as needed for nausea or vomiting.    Dispense:  20 tablet    Refill:  0    I personally performed the services described in this documentation, which was scribed in my presence. The recorded information has been reviewed and considered, and addended by me as needed.   [name], M.D.  [facility] [address] [place], [place] [address] [phone] phone [phone] fax  07/03/16 1:20 PM

## 2016-07-04 ENCOUNTER — Telehealth: Payer: Self-pay

## 2016-07-04 DIAGNOSIS — M4807 Spinal stenosis, lumbosacral region: Secondary | ICD-10-CM | POA: Diagnosis not present

## 2016-07-04 DIAGNOSIS — H8110 Benign paroxysmal vertigo, unspecified ear: Secondary | ICD-10-CM | POA: Diagnosis not present

## 2016-07-04 NOTE — Telephone Encounter (Signed)
PA needed for promethazine. Called pharm to get clarification and was told that pt has not tried to p/up, but that Medicare D requires a PA to show that part B will not cover it. Called pt to check status to see if this is even needed at this point. Pt reported that her nausea is better, so not needed, and she is f/up w/Dr Brigitte Pulse tomorrow.

## 2016-07-05 ENCOUNTER — Ambulatory Visit (INDEPENDENT_AMBULATORY_CARE_PROVIDER_SITE_OTHER): Payer: PPO | Admitting: Family Medicine

## 2016-07-05 ENCOUNTER — Encounter: Payer: Self-pay | Admitting: Family Medicine

## 2016-07-05 VITALS — BP 122/86 | HR 74 | Temp 98.4°F | Resp 18 | Ht 64.5 in | Wt 192.0 lb

## 2016-07-05 DIAGNOSIS — Z5181 Encounter for therapeutic drug level monitoring: Secondary | ICD-10-CM

## 2016-07-05 DIAGNOSIS — E785 Hyperlipidemia, unspecified: Secondary | ICD-10-CM | POA: Diagnosis not present

## 2016-07-05 DIAGNOSIS — E871 Hypo-osmolality and hyponatremia: Secondary | ICD-10-CM

## 2016-07-05 LAB — BASIC METABOLIC PANEL
BUN: 10 mg/dL (ref 7–25)
CALCIUM: 9.2 mg/dL (ref 8.6–10.4)
CO2: 26 mmol/L (ref 20–31)
Chloride: 96 mmol/L — ABNORMAL LOW (ref 98–110)
Creat: 0.79 mg/dL (ref 0.50–0.99)
GLUCOSE: 99 mg/dL (ref 65–99)
Potassium: 3.4 mmol/L — ABNORMAL LOW (ref 3.5–5.3)
SODIUM: 133 mmol/L — AB (ref 135–146)

## 2016-07-05 LAB — HEPATITIS C RNA QUANTITATIVE: HCV Quantitative: NOT DETECTED IU/mL (ref <15)

## 2016-07-05 LAB — OSMOLALITY, URINE: Osmolality, Ur: 255 mOsm/kg (ref 50–1200)

## 2016-07-05 LAB — OSMOLALITY: OSMOLALITY: 273 mosm/kg — AB (ref 278–305)

## 2016-07-05 LAB — SODIUM, URINE, RANDOM: Sodium, Ur: <10 mmol/L (ref 28–272)

## 2016-07-05 MED ORDER — PRAVASTATIN SODIUM 20 MG PO TABS: 20.0000 mg | ORAL_TABLET | Freq: Every day | ORAL | 1 refills | Status: DC

## 2016-07-05 NOTE — Progress Notes (Signed)
Subjective:  This chart was scribed for [name] MD, by Tamsen Roers, at [facility].  This patient was seen in room 2 and the patient's care was started at 8:12 AM.   Chief Complaint  Patient presents with  . Follow-up    SODIUM LEVELS     Patient ID: Becky Lawson, female    DOB: [birth year], 65 y.o.   MRN: [id number]  HPI HPI Comments: Becky Lawson is a 65 y.o. female who presents to the [facility] for a follow up. Patient presented 3 days prior with complaints of nausea, lightheadedness and decreased appetite for approximately 1 month.  Patent had thyroid left lobectomy several weeks prior but her symptoms predated this.  We did note at that visit that her symptoms seemed to occur the same that that she started trileptal. Labs returned showing sodium of 123 and potassium 3.3.  Had patient ween down on Trileptal from 450 QHS to 150 QHS and stop HCTZ.   Patient states that she is feeling "100%" better today.  She was able to clean her house yesterday and didn't wake up feeling exhausted. She is currently periodically taking her vertigo medication when she feels dizzy.  She is no longer taking her HCTZ and is compliant with her Trileptal.   Cholesterol: Patient is willing to take cholesterol medication again.  She has not taken any other medications besides Welchol.   ASCBD risk score: 7.7.   Patient is a retired Marine scientist.   Past Medical History:  Diagnosis Date  . Anxiety   . Arthritis   . Bipolar 1 disorder ([place])   . Chronic kidney disease   . Depression   . GERD (gastroesophageal reflux disease)   . Heart murmur   . Hepatitis C    resolved completely after treatment in 2014, genotype 1b, followed at [facility]  . Hyperlipidemia   . Hypertension   . Jaundice 11/01/2012  . Pruritic disorder 02/13/2013  . Sleep apnea    Was diagnosed approximately 20 years ago, but does not wear CPAP patient stated "I gave it back.Marland KitchenMarland KitchenI couldn't wear that thing it  was horrible"  . Spinal stenosis     Current Outpatient Prescriptions on File Prior to Visit  Medication Sig Dispense Refill  . ALPRAZolam (XANAX) 1 MG tablet Take 1 mg by mouth at bedtime as needed for anxiety.    [name] amLODipine (NORVASC) 10 MG tablet Take 1 tablet (10 mg total) by mouth daily. (Patient taking differently: Take 10 mg by mouth every evening. ) 90 tablet 3  . buPROPion (WELLBUTRIN XL) 150 MG 24 hr tablet Take 150 mg by mouth daily.     . calcium-vitamin D (OSCAL WITH D) 500-200 MG-UNIT tablet Take 1 tablet by mouth 3 (three) times daily.    . colesevelam (WELCHOL) 625 MG tablet Take 1 tablet (625 mg total) by mouth 2 (two) times daily with a meal. 180 tablet 3  . ergocalciferol (VITAMIN D2) 50000 units capsule Take 1 capsule (50,000 Units total) by mouth once a week. 24 capsule 0  . HYDROcodone-acetaminophen (NORCO) 10-325 MG tablet Take 1 tablet by mouth 3 (three) times daily.     . hydrOXYzine (ATARAX/VISTARIL) 25 MG tablet Take 2 tablets (50 mg total) by mouth every 6 (six) hours as needed. PATIENT NEEDS OFFICE VISIT FOR ADDITIONAL REFILLS (Patient taking differently: Take 50 mg by mouth every 6 (six) hours as needed for anxiety or itching. PATIENT NEEDS OFFICE VISIT FOR ADDITIONAL  REFILLS) 40 tablet 0  . lamoTRIgine (LAMICTAL) 200 MG tablet Take 200 mg by mouth 2 (two) times daily.    [name] levothyroxine (SYNTHROID, LEVOTHROID) 75 MCG tablet Take 1 tablet (75 mcg total) by mouth daily before breakfast. 30 tablet 3  . meclizine (ANTIVERT) 25 MG tablet Take 1 tablet (25 mg total) by mouth 3 (three) times daily as needed for dizziness. 30 tablet 0  . Omega-3 Fatty Acids (FISH OIL ULTRA) 1400 MG CAPS Take 1,400 mg by mouth daily.    [name] omeprazole (PRILOSEC) 40 MG capsule TAKE 1 BY MOUTH DAILY (Patient taking differently: Take 40 mg by mouth every evening. ) 90 capsule 1  . spironolactone (ALDACTONE) 25 MG tablet Take 1 tablet (25 mg total) by mouth daily. 90 tablet 1  . traZODone  (DESYREL) 100 MG tablet Take 300 mg by mouth at bedtime.     . hydrochlorothiazide (HYDRODIURIL) 25 MG tablet TAKE 1 BY MOUTH DAILY (Patient not taking: Reported on 07/05/2016) 90 tablet 1  . OXcarbazepine (TRILEPTAL) 150 MG tablet Take 300 mg by mouth at bedtime.     . promethazine (PHENERGAN) 25 MG tablet Take 1 tablet (25 mg total) by mouth every 8 (eight) hours as needed for nausea or vomiting. (Patient not taking: Reported on 07/05/2016) 20 tablet 0   No current facility-administered medications on file prior to visit.     Allergies  Allergen Reactions  . No Known Allergies       Review of Systems  Constitutional: Negative for chills and fever.  Eyes: Negative for pain, redness and itching.  Respiratory: Negative for cough, choking and shortness of breath.   Gastrointestinal: Negative for nausea and vomiting.       Objective:   Physical Exam  Constitutional: She is oriented to person, place, and time. She appears well-developed and well-nourished. No distress.  HENT:  Head: Normocephalic and atraumatic.  Eyes: Pupils are equal, round, and reactive to light.  Cardiovascular: Normal rate, regular rhythm and normal heart sounds.  Exam reveals no gallop and no friction rub.   No murmur heard. Pulmonary/Chest: Effort normal and breath sounds normal. No respiratory distress.  Neurological: She is alert and oriented to person, place, and time.  Skin: Skin is warm and dry.  Psychiatric: She has a normal mood and affect. Her behavior is normal.   Vitals:   07/05/16 0804  BP: 122/86  Pulse: 74  Resp: 18  Temp: 98.4 F (36.9 C)  TempSrc: Oral  SpO2: 95%  Weight: 192 lb (87.1 kg)  Height: 5' 4.5" (1.638 m)        Assessment & Plan:   1. Hyponatremia - all sxs resolved when she decreased her trileptal from 450 to 150mg  qhs and increased salt in diet  2. Hyperlipidemia, unspecified hyperlipidemia type - start statin, recheck lifts in 6-8 wmos  3. Medication monitoring  encounter     Orders Placed This Encounter  Procedures  . Basic metabolic panel    Order Specific Question:   Has the patient fasted?    Answer:   No  . Osmolality  . Osmolality, urine  . Sodium, urine, random  . Comprehensive metabolic panel    Standing Status:   Future    Standing Expiration Date:   07/05/2017    Meds ordered this encounter  Medications  . pravastatin (PRAVACHOL) 20 MG tablet    Sig: Take 1 tablet (20 mg total) by mouth daily.    Dispense:  30 tablet  Refill:  1    I personally performed the services described in this documentation, which was scribed in my presence. The recorded information has been reviewed and considered, and addended by me as needed.   [name], M.D.  [facility] [address] [place], [place] [address] [phone] phone [phone] fax  07/08/16 10:11 PM

## 2016-07-05 NOTE — Patient Instructions (Signed)
     IF you received an x-ray today, you will receive an invoice from [facility]. Please contact [facility] at [phone] with questions or concerns regarding your invoice.   IF you received labwork today, you will receive an invoice from [facility]. Please contact [facility] at [phone] with questions or concerns regarding your invoice.   Our billing staff will not be able to assist you with questions regarding bills from these companies.  You will be contacted with the lab results as soon as they are available. The fastest way to get your results is to activate your My Chart account. Instructions are located on the last page of this paperwork. If you have not heard from us regarding the results in 2 weeks, please contact this office.      

## 2016-07-11 ENCOUNTER — Telehealth: Payer: Self-pay

## 2016-07-11 NOTE — Telephone Encounter (Signed)
I called pt. Explained I saw that they were back to normal and so did not sit down to right a letter yet.  I'm sorry she felt like the case was so dire - I clearly mis-communicated to her on how long it would take for her to receive normal lab results.  Pt apologized for her calls and agrees to allow more time to receive normal results in the future.

## 2016-07-11 NOTE — Telephone Encounter (Signed)
When Ms. Hill contacted the patient, she reported that she does not need to go to the emergency department, that her situation is no longer life-threatening.   She requests her lab results and states that waiting until Dr. Brigitte Pulse reviews the results and contacts her is unacceptable.  Results for orders placed or performed in visit on 87/57/97  Basic metabolic panel  Result Value Ref Range   Sodium 133 (L) 135 - 146 mmol/L   Potassium 3.4 (L) 3.5 - 5.3 mmol/L   Chloride 96 (L) 98 - 110 mmol/L   CO2 26 20 - 31 mmol/L   Glucose, Bld 99 65 - 99 mg/dL   BUN 10 7 - 25 mg/dL   Creat 0.79 0.50 - 0.99 mg/dL   Calcium 9.2 8.6 - 10.4 mg/dL  Osmolality  Result Value Ref Range   Osmolality 273 (L) 278 - 305 mOsm/kg  Osmolality, urine  Result Value Ref Range   Osmolality, Ur 255 50 - 1,200 mOsm/kg  Sodium, urine, random  Result Value Ref Range   Sodium, Ur < 10 28 - 272 mmol/L    Several of the electrolytes are mildly low, however, all of the measurements are improved from the previous measurement when she was feeling bad.  Await Dr. Raul Del review and additional instructions.

## 2016-07-11 NOTE — Telephone Encounter (Signed)
Msg is for Becky Lawson,  Pt had her sodium levels done on 10/31 and the following Tuesday Shaw collected more samples. She said she can see her results but for some reason I don't see any other results for other dates. Pt said that her levels were so low that if she was having her same symptoms shaw told her to call 911.  Please advise immediately because she said its life threatening  [phone] (M)

## 2016-07-11 NOTE — Telephone Encounter (Signed)
Spoke with patient again after Harrison Mons PA-C reviewed the labs and gave her the results.  She still was not happy and stated that she wanted Dr. Brigitte Pulse to know that this "fell through the cracks" and should have been addressed days ago.  She would like Dr. Brigitte Pulse to know she feels very highly of her, but she does not understand why she was not notified sooner as this was "a life or death situation."  I reassured the patient that I would make Dr. Brigitte Pulse aware and she was appreciative of that.

## 2016-07-12 ENCOUNTER — Other Ambulatory Visit: Payer: Self-pay

## 2016-07-12 MED ORDER — PRAVASTATIN SODIUM 20 MG PO TABS: 20.0000 mg | ORAL_TABLET | Freq: Every day | ORAL | 1 refills | Status: DC

## 2016-07-12 NOTE — Telephone Encounter (Signed)
Pt given Pravastatin 20mg  #30 with 1 refill 07/05/2016 Faxed request for refill. Pt to return in 2 months. Appt 12/01 in Epic with [place]. Please advise.

## 2016-07-26 DIAGNOSIS — F319 Bipolar disorder, unspecified: Secondary | ICD-10-CM | POA: Diagnosis not present

## 2016-08-01 DIAGNOSIS — F319 Bipolar disorder, unspecified: Secondary | ICD-10-CM | POA: Diagnosis not present

## 2016-08-05 ENCOUNTER — Ambulatory Visit: Payer: PPO | Admitting: Endocrinology

## 2016-08-12 DIAGNOSIS — M5136 Other intervertebral disc degeneration, lumbar region: Secondary | ICD-10-CM | POA: Diagnosis not present

## 2016-08-12 DIAGNOSIS — M25542 Pain in joints of left hand: Secondary | ICD-10-CM | POA: Diagnosis not present

## 2016-08-23 DIAGNOSIS — F319 Bipolar disorder, unspecified: Secondary | ICD-10-CM | POA: Diagnosis not present

## 2016-08-26 ENCOUNTER — Other Ambulatory Visit: Payer: Self-pay | Admitting: Family Medicine

## 2016-09-09 ENCOUNTER — Ambulatory Visit (INDEPENDENT_AMBULATORY_CARE_PROVIDER_SITE_OTHER): Payer: PPO | Admitting: Endocrinology

## 2016-09-09 ENCOUNTER — Encounter: Payer: Self-pay | Admitting: Endocrinology

## 2016-09-09 DIAGNOSIS — E890 Postprocedural hypothyroidism: Secondary | ICD-10-CM

## 2016-09-09 LAB — TSH: TSH: 0.85 u[IU]/mL (ref 0.35–4.50)

## 2016-09-09 NOTE — Progress Notes (Signed)
Subjective:    Patient ID: Becky Lawson, female    DOB: 28-Nov-1950, 66 y.o.   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  She had left thyroid lobectomy in 2017 (pathol showed PAPILLARY THYROID CARCINOMA, 0.7 CM; only f/u advised was periodic physical examination).  Postsurgical hypothyroidism: She takes synthroid as rx'ed.    Past Medical History:  Diagnosis Date  . Anxiety   . Arthritis   . Bipolar 1 disorder ([place])   . Chronic kidney disease   . Depression   . GERD (gastroesophageal reflux disease)   . Heart murmur   . Hepatitis C    resolved completely after treatment in 2014, genotype 1b, followed at [facility]  . Hyperlipidemia   . Hypertension   . Jaundice 11/01/2012  . Pruritic disorder 02/13/2013  . Sleep apnea    Was diagnosed approximately 20 years ago, but does not wear CPAP patient stated "I gave it back.Marland KitchenMarland KitchenI couldn't wear that thing it was horrible"  . Spinal stenosis     Past Surgical History:  Procedure Laterality Date  . ABDOMINAL HYSTERECTOMY    . COLONOSCOPY W/ POLYPECTOMY    . JOINT REPLACEMENT Bilateral    knee  . KNEE ARTHROSCOPY Bilateral   . PARTIAL HYSTERECTOMY    . THYROID LOBECTOMY Left 06/16/2016  . THYROID LOBECTOMY Left 06/16/2016   Procedure: LEFT THYROID LOBECTOMY;  Surgeon: Greer Pickerel, MD;  Location: [place];  Service: General;  Laterality: Left;    Social History   Social History  . Marital status: Divorced    Spouse name: N/A  . Number of children: N/A  . Years of education: N/A   Occupational History  . Personal Caregiver    Social History Main Topics  . Smoking status: Former Smoker    Packs/day: 1.00    Years: 15.00    Quit date: 06/30/1979  . Smokeless tobacco: Never Used  . Alcohol use 0.6 oz/week    1 Standard drinks or equivalent per week     Comment: 1 glass of red wine daily  . Drug use: No  . Sexual activity: Yes    Birth control/ protection: None   Other Topics Concern  . Not on file    Social History Narrative   Divorced. Education: [employer]. Exercise: No.    Current Outpatient Prescriptions on File Prior to Visit  Medication Sig Dispense Refill  . ALPRAZolam (XANAX) 1 MG tablet Take 1 mg by mouth at bedtime as needed for anxiety.    [name] amLODipine (NORVASC) 10 MG tablet Take 1 tablet (10 mg total) by mouth daily. (Patient taking differently: Take 10 mg by mouth every evening. ) 90 tablet 3  . buPROPion (WELLBUTRIN XL) 150 MG 24 hr tablet Take 150 mg by mouth daily.     . calcium-vitamin D (OSCAL WITH D) 500-200 MG-UNIT tablet Take 1 tablet by mouth 3 (three) times daily.    . colesevelam (WELCHOL) 625 MG tablet Take 1 tablet (625 mg total) by mouth 2 (two) times daily with a meal. 180 tablet 3  . ergocalciferol (VITAMIN D2) 50000 units capsule Take 1 capsule (50,000 Units total) by mouth once a week. 24 capsule 0  . HYDROcodone-acetaminophen (NORCO) 10-325 MG tablet Take 1 tablet by mouth 3 (three) times daily.     . hydrOXYzine (ATARAX/VISTARIL) 25 MG tablet Take 2 tablets (50 mg total) by mouth every 6 (six) hours as needed. PATIENT NEEDS OFFICE VISIT FOR ADDITIONAL REFILLS (Patient taking differently:  Take 50 mg by mouth every 6 (six) hours as needed for anxiety or itching. PATIENT NEEDS OFFICE VISIT FOR ADDITIONAL REFILLS) 40 tablet 0  . lamoTRIgine (LAMICTAL) 200 MG tablet Take 200 mg by mouth 2 (two) times daily.    [name] levothyroxine (SYNTHROID, LEVOTHROID) 75 MCG tablet Take 1 tablet (75 mcg total) by mouth daily before breakfast. 30 tablet 3  . meclizine (ANTIVERT) 25 MG tablet Take 1 tablet (25 mg total) by mouth 3 (three) times daily as needed for dizziness. 30 tablet 0  . Omega-3 Fatty Acids (FISH OIL ULTRA) 1400 MG CAPS Take 1,400 mg by mouth daily.    [name] omeprazole (PRILOSEC) 40 MG capsule TAKE 1 BY MOUTH DAILY 90 capsule 0  . OXcarbazepine (TRILEPTAL) 150 MG tablet Take 150 mg by mouth at bedtime.    . pravastatin (PRAVACHOL) 20 MG tablet Take 1 tablet (20 mg  total) by mouth daily. 30 tablet 1  . promethazine (PHENERGAN) 25 MG tablet Take 1 tablet (25 mg total) by mouth every 8 (eight) hours as needed for nausea or vomiting. 20 tablet 0  . traZODone (DESYREL) 100 MG tablet Take 300 mg by mouth at bedtime.      No current facility-administered medications on file prior to visit.     Allergies  Allergen Reactions  . No Known Allergies     Family History  Problem Relation Age of Onset  . Diabetes Mother   . Heart disease Mother   . Hyperlipidemia Mother   . Hypertension Mother   . Stroke Mother   . Hypertension Father   . Parkinson's disease Father   . Heart disease Father   . Hyperlipidemia Father   . Hemachromatosis Daughter   . Thyroid disease Neg Hx     BP (!) 146/87   Pulse 80   Ht 5' 4.5" (1.638 m)   Wt 192 lb (87.1 kg)   SpO2 96%   BMI 32.45 kg/m   Review of Systems She has lost a few lbs.      Objective:   Physical Exam VITAL SIGNS:  See vs page GENERAL: no distress Neck: a healed scar is present.  i do not appreciate a nodule in the thyroid or elsewhere in the neck   Lab Results  Component Value Date   TSH 0.85 09/09/2016   T4TOTAL 8.6 02/25/2016      Assessment & Plan:  Papillary adenocarcinoma of the thyroid: low risk for recurrence. Postsurgical hypothyroidism, well-replaced.  Patient is advised the following: Please continue the same medication Please come back for a follow-up appointment in 6 months.

## 2016-09-09 NOTE — Patient Instructions (Addendum)
A thyroid blood test is requested for you today.  We'll let you know about the results. Please come back for a follow-up appointment in 6 months. 

## 2016-09-10 ENCOUNTER — Encounter: Payer: Self-pay | Admitting: Endocrinology

## 2016-09-22 ENCOUNTER — Ambulatory Visit: Payer: PPO | Admitting: Family Medicine

## 2016-09-26 ENCOUNTER — Ambulatory Visit: Payer: PPO | Admitting: Family Medicine

## 2016-10-15 ENCOUNTER — Other Ambulatory Visit: Payer: Self-pay | Admitting: Endocrinology

## 2016-10-17 ENCOUNTER — Other Ambulatory Visit: Payer: Self-pay | Admitting: General Surgery

## 2016-10-17 DIAGNOSIS — E041 Nontoxic single thyroid nodule: Secondary | ICD-10-CM

## 2016-10-28 ENCOUNTER — Telehealth: Payer: Self-pay

## 2016-10-28 ENCOUNTER — Ambulatory Visit (INDEPENDENT_AMBULATORY_CARE_PROVIDER_SITE_OTHER): Payer: PPO | Admitting: Family Medicine

## 2016-10-28 VITALS — BP 145/79 | HR 79 | Temp 98.8°F | Resp 16 | Ht 64.0 in | Wt 198.2 lb

## 2016-10-28 DIAGNOSIS — E559 Vitamin D deficiency, unspecified: Secondary | ICD-10-CM | POA: Diagnosis not present

## 2016-10-28 DIAGNOSIS — I10 Essential (primary) hypertension: Secondary | ICD-10-CM

## 2016-10-28 DIAGNOSIS — E890 Postprocedural hypothyroidism: Secondary | ICD-10-CM | POA: Diagnosis not present

## 2016-10-28 DIAGNOSIS — E785 Hyperlipidemia, unspecified: Secondary | ICD-10-CM | POA: Diagnosis not present

## 2016-10-28 DIAGNOSIS — Z5181 Encounter for therapeutic drug level monitoring: Secondary | ICD-10-CM | POA: Diagnosis not present

## 2016-10-28 DIAGNOSIS — F3177 Bipolar disorder, in partial remission, most recent episode mixed: Secondary | ICD-10-CM

## 2016-10-28 DIAGNOSIS — E041 Nontoxic single thyroid nodule: Secondary | ICD-10-CM

## 2016-10-28 DIAGNOSIS — K219 Gastro-esophageal reflux disease without esophagitis: Secondary | ICD-10-CM | POA: Diagnosis not present

## 2016-10-28 DIAGNOSIS — R5383 Other fatigue: Secondary | ICD-10-CM

## 2016-10-28 MED ORDER — LEVOTHYROXINE SODIUM 50 MCG PO TABS: 50.0000 ug | ORAL_TABLET | Freq: Every day | ORAL | 2 refills | Status: DC

## 2016-10-28 MED ORDER — HYDROXYZINE HCL 25 MG PO TABS: 50.0000 mg | ORAL_TABLET | Freq: Four times a day (QID) | ORAL | 0 refills | Status: DC | PRN

## 2016-10-28 NOTE — Progress Notes (Signed)
Subjective:  By signing my name below, I, Becky Lawson, attest that this documentation has been prepared under the direction and in the presence of [name], MD Electronically Signed: Ladene Artist, ED Scribe 10/28/2016 at 5:42 PM.   Patient ID: Becky Lawson, female    DOB: [birth year], 66 y.o.   MRN: [id number]  Chief Complaint  Patient presents with  . here to discuss Levothyroxine dosage  . Sodium level   HPI HPI Comments: Becky Lawson is a 66 y.o. female who presents to the [facility] to discuss Levothyroxine dosage. Pt was seen 6 weeks prior by endocrinologist Dr. Loanne Drilling. Pt has papillary thyroid cance s/p left lobectomy last year. Only follow-up advised was periodic physical. Pt had post-surgical hypothyroidism. TSH was within normal range at 0.85 so continued on Levothyroxine 75 and advised recheck in 6 months. Pt also had an episode of hyponatremia when she started Trileptal. Her sodium levels decreased to 123 and she developed nausea, light-headedness and anorexia. Symptoms resolved when HCTZ discontinued. Trileptal dose decreased from 450-150 and increased salt in her diet.  Today, pt reports symptoms of hair loss, mood symptoms and fatigue. Pt states that she has been angry, experienced a few manic episodes and has been very irritable lately. She has started taking hydroxyzine 2-4 times daily and is followed by psychiatrist Comer Locket. She also reports that she slept 17 hours 2 days ago due to severe fatigue. Pt has noticed some constipation since she stopped fiber. She denies changes to her skin. Pt has not started Biotin yet but plans to start today for hair loss. She denies starting any new medications within the past few months.   HTN Pt does not check her BP at home. She states that she does not have the finances at this time to purchase a cuff. Triage BP: 140/74.   Fall Pt also reports right shoulder pain and right hip pain s/p a fall. She plans to return  next week for XRs.   Past Medical History:  Diagnosis Date  . Anxiety   . Arthritis   . Bipolar 1 disorder ([place])   . Chronic kidney disease   . Depression   . GERD (gastroesophageal reflux disease)   . Heart murmur   . Hepatitis C    resolved completely after treatment in 2014, genotype 1b, followed at [facility]  . Hyperlipidemia   . Hypertension   . Jaundice 11/01/2012  . Pruritic disorder 02/13/2013  . Sleep apnea    Was diagnosed approximately 20 years ago, but does not wear CPAP patient stated "I gave it back.Marland KitchenMarland KitchenI couldn't wear that thing it was horrible"  . Spinal stenosis    Current Outpatient Prescriptions on File Prior to Visit  Medication Sig Dispense Refill  . ALPRAZolam (XANAX) 1 MG tablet Take 1 mg by mouth at bedtime as needed for anxiety.    [name] amLODipine (NORVASC) 10 MG tablet Take 1 tablet (10 mg total) by mouth daily. (Patient taking differently: Take 10 mg by mouth every evening. ) 90 tablet 3  . buPROPion (WELLBUTRIN XL) 150 MG 24 hr tablet Take 150 mg by mouth daily.     . calcium-vitamin D (OSCAL WITH D) 500-200 MG-UNIT tablet Take 1 tablet by mouth 3 (three) times daily.    . colesevelam (WELCHOL) 625 MG tablet Take 1 tablet (625 mg total) by mouth 2 (two) times daily with a meal. 180 tablet 3  . ergocalciferol (VITAMIN D2) 50000 units capsule Take 1 capsule (  50,000 Units total) by mouth once a week. 24 capsule 0  . HYDROcodone-acetaminophen (NORCO) 10-325 MG tablet Take 1 tablet by mouth 3 (three) times daily.     . hydrOXYzine (ATARAX/VISTARIL) 25 MG tablet Take 2 tablets (50 mg total) by mouth every 6 (six) hours as needed. PATIENT NEEDS OFFICE VISIT FOR ADDITIONAL REFILLS (Patient taking differently: Take 50 mg by mouth every 6 (six) hours as needed for anxiety or itching. PATIENT NEEDS OFFICE VISIT FOR ADDITIONAL REFILLS) 40 tablet 0  . lamoTRIgine (LAMICTAL) 200 MG tablet Take 200 mg by mouth 2 (two) times daily.    [name] levothyroxine (SYNTHROID, LEVOTHROID) 75 MCG  tablet TAKE 1 TABLET BY MOUTH DAILY BEFORE BREAKFAST 30 tablet 3  . meclizine (ANTIVERT) 25 MG tablet Take 1 tablet (25 mg total) by mouth 3 (three) times daily as needed for dizziness. 30 tablet 0  . Omega-3 Fatty Acids (FISH OIL ULTRA) 1400 MG CAPS Take 1,400 mg by mouth daily.    [name] omeprazole (PRILOSEC) 40 MG capsule TAKE 1 BY MOUTH DAILY 90 capsule 0  . OXcarbazepine (TRILEPTAL) 150 MG tablet Take 150 mg by mouth at bedtime.    . pravastatin (PRAVACHOL) 20 MG tablet Take 1 tablet (20 mg total) by mouth daily. 30 tablet 1  . traZODone (DESYREL) 100 MG tablet Take 300 mg by mouth at bedtime.     . promethazine (PHENERGAN) 25 MG tablet Take 1 tablet (25 mg total) by mouth every 8 (eight) hours as needed for nausea or vomiting. (Patient not taking: Reported on 10/28/2016) 20 tablet 0   No current facility-administered medications on file prior to visit.    Allergies  Allergen Reactions  . No Known Allergies    Review of Systems  Constitutional: Positive for fatigue.  Gastrointestinal: Positive for constipation.  Musculoskeletal: Positive for arthralgias.  Skin: Negative for color change and pallor.  Psychiatric/Behavioral: Positive for agitation.      Objective:   Physical Exam  Constitutional: She is oriented to person, place, and time. She appears well-developed and well-nourished. No distress.  HENT:  Head: Normocephalic and atraumatic.  Eyes: Conjunctivae and EOM are normal.  Neck: Neck supple. No tracheal deviation present.  R thyroid lobe feels normal.  Cardiovascular: Normal rate, regular rhythm, S1 normal and S2 normal.   Murmur heard.  Systolic (injection L upper sternal border) murmur is present with a grade of 2/6  Pulmonary/Chest: Effort normal and breath sounds normal. No respiratory distress.  Musculoskeletal: Normal range of motion.  Neurological: She is alert and oriented to person, place, and time.  Skin: Skin is warm and dry.  Psychiatric: She has a normal mood  and affect. Her behavior is normal.  Nursing note and vitals reviewed.  BP 140/74   Pulse 79   Temp 98.8 F (37.1 C) (Oral)   Resp 16   Ht 5\' 4"  (1.626 m)   Wt 198 lb 3.2 oz (89.9 kg)   SpO2 95%   BMI 34.02 kg/m     Assessment & Plan:  Look at Database. Is pt taking Norco?  1. Fatigue, unspecified type   2. Thyroid nodule   3. Post-surgical hypothyroidism   4. Bipolar disorder, in partial remission, most recent episode mixed ([place])   5. Vitamin D deficiency   6. Hyperlipidemia, unspecified hyperlipidemia type   7. Gastroesophageal reflux disease, esophagitis presence not specified   8. Essential hypertension   9. Medication monitoring encounter     Orders Placed This Encounter  Procedures  .  US THYROID    Standing Status:   Future    Standing Expiration Date:   12/26/2017    Order Specific Question:   Reason for Exam (SYMPTOM  OR DIAGNOSIS REQUIRED)    Answer:   f/u right thyroid nodules    Order Specific Question:   Preferred imaging location?    Answer:   [facility]  . VITAMIN D 25 Hydroxy (Vit-D Deficiency, Fractures)  . Comprehensive metabolic panel  . Thyroid Panel With TSH  . Care order/instruction:    Scheduling Instructions:     Complete orders, AVS and go.    Meds ordered this encounter  Medications  . co-enzyme Q-10 30 MG capsule    Sig: Take 30 mg by mouth 3 (three) times daily.  . vitamin E 100 UNIT capsule    Sig: Take by mouth daily.  . calcium carbonate 1250 MG capsule    Sig: Take 1,250 mg by mouth 2 (two) times daily with a meal.  . Ginkgo Biloba Extract 60 MG CAPS    Sig: Take by mouth.  . DISCONTD: BIOTIN 5000 PO    Sig: Take by mouth.  . levothyroxine (SYNTHROID, LEVOTHROID) 50 MCG tablet    Sig: Take 1 tablet (50 mcg total) by mouth daily before breakfast.    Dispense:  30 tablet    Refill:  2  . hydrOXYzine (ATARAX/VISTARIL) 25 MG tablet    Sig: Take 2 tablets (50 mg total) by mouth every 6 (six) hours as needed for anxiety or  itching.    Dispense:  240 tablet    Refill:  0    I personally performed the services described in this documentation, which was scribed in my presence. The recorded information has been reviewed and considered, and addended by me as needed.   [name], M.D.  Primary Care at [facility] [address] [place], [place] [address] [phone] phone [phone] fax  11/05/16 8:22 AM

## 2016-10-28 NOTE — Patient Instructions (Addendum)
Make an appointment with me for our [facility] clinic to address your joint pain from your falls - make it a morning appointment so we can do your fasting lipids then - nothing to eat after midnight the night prior though water or black coffee are fine.    IF you received an x-ray today, you will receive an invoice from [facility]. Please contact [facility] at [phone] with questions or concerns regarding your invoice.   IF you received labwork today, you will receive an invoice from [facility]. Please contact [facility] at [phone] with questions or concerns regarding your invoice.   Our billing staff will not be able to assist you with questions regarding bills from these companies.  You will be contacted with the lab results as soon as they are available. The fastest way to get your results is to activate your My Chart account. Instructions are located on the last page of this paperwork. If you have not heard from [place] regarding the results in 2 weeks, please contact this office.

## 2016-10-28 NOTE — Telephone Encounter (Signed)
Pa hydroxyzine needed  Key [id number] covermymeds

## 2016-10-29 LAB — COMPREHENSIVE METABOLIC PANEL
ALBUMIN: 4.1 g/dL (ref 3.6–4.8)
ALK PHOS: 78 IU/L (ref 39–117)
ALT: 13 IU/L (ref 0–32)
AST: 19 IU/L (ref 0–40)
Albumin/Globulin Ratio: 1.9 (ref 1.2–2.2)
BUN / CREAT RATIO: 16 (ref 12–28)
BUN: 10 mg/dL (ref 8–27)
CHLORIDE: 98 mmol/L (ref 96–106)
CO2: 26 mmol/L (ref 18–29)
Calcium: 9 mg/dL (ref 8.7–10.3)
Creatinine, Ser: 0.64 mg/dL (ref 0.57–1.00)
GFR calc non Af Amer: 94 mL/min/{1.73_m2} (ref >59)
GFR, EST AFRICAN AMERICAN: 108 mL/min/{1.73_m2} (ref >59)
GLOBULIN, TOTAL: 2.2 g/dL (ref 1.5–4.5)
GLUCOSE: 90 mg/dL (ref 65–99)
Potassium: 4.2 mmol/L (ref 3.5–5.2)
SODIUM: 137 mmol/L (ref 134–144)
TOTAL PROTEIN: 6.3 g/dL (ref 6.0–8.5)

## 2016-10-29 LAB — THYROID PANEL WITH TSH
Free Thyroxine Index: 2 (ref 1.2–4.9)
T3 Uptake Ratio: 28 % (ref 24–39)
T4, Total: 7.3 ug/dL (ref 4.5–12.0)
TSH: 2.11 u[IU]/mL (ref 0.450–4.500)

## 2016-10-29 LAB — VITAMIN D 25 HYDROXY (VIT D DEFICIENCY, FRACTURES): Vit D, 25-Hydroxy: 18.5 ng/mL — ABNORMAL LOW (ref 30.0–100.0)

## 2016-10-31 NOTE — Telephone Encounter (Signed)
Resent via fax to cover my meds.  Recheck in 5 business days for authorization.

## 2016-10-31 NOTE — Telephone Encounter (Signed)
Key code is [id number] (tried to send to plan but cover my meds site would not send.  This needs to be done at a later time)

## 2016-11-01 NOTE — Telephone Encounter (Signed)
Approved 10/31/16-09/04/17 Pharm advised

## 2016-11-04 ENCOUNTER — Ambulatory Visit
Admission: RE | Admit: 2016-11-04 | Discharge: 2016-11-04 | Disposition: A | Discharge status: Home / Self Care | Payer: PPO | Source: Ambulatory Visit | Attending: General Surgery | Admitting: General Surgery

## 2016-11-04 DIAGNOSIS — E041 Nontoxic single thyroid nodule: Secondary | ICD-10-CM | POA: Diagnosis not present

## 2016-11-05 ENCOUNTER — Ambulatory Visit (INDEPENDENT_AMBULATORY_CARE_PROVIDER_SITE_OTHER): Payer: PPO

## 2016-11-05 ENCOUNTER — Encounter: Payer: Self-pay | Admitting: Family Medicine

## 2016-11-05 ENCOUNTER — Ambulatory Visit (INDEPENDENT_AMBULATORY_CARE_PROVIDER_SITE_OTHER): Payer: PPO | Admitting: Family Medicine

## 2016-11-05 VITALS — BP 138/80 | HR 88 | Temp 98.3°F | Resp 18 | Ht 64.0 in | Wt 194.0 lb

## 2016-11-05 DIAGNOSIS — S79912A Unspecified injury of left hip, initial encounter: Secondary | ICD-10-CM | POA: Diagnosis not present

## 2016-11-05 DIAGNOSIS — E785 Hyperlipidemia, unspecified: Secondary | ICD-10-CM | POA: Diagnosis not present

## 2016-11-05 DIAGNOSIS — M25532 Pain in left wrist: Secondary | ICD-10-CM | POA: Diagnosis not present

## 2016-11-05 DIAGNOSIS — Z8619 Personal history of other infectious and parasitic diseases: Secondary | ICD-10-CM | POA: Diagnosis not present

## 2016-11-05 DIAGNOSIS — S6992XA Unspecified injury of left wrist, hand and finger(s), initial encounter: Secondary | ICD-10-CM

## 2016-11-05 DIAGNOSIS — M7062 Trochanteric bursitis, left hip: Secondary | ICD-10-CM | POA: Diagnosis not present

## 2016-11-05 DIAGNOSIS — G5603 Carpal tunnel syndrome, bilateral upper limbs: Secondary | ICD-10-CM

## 2016-11-05 DIAGNOSIS — R160 Hepatomegaly, not elsewhere classified: Secondary | ICD-10-CM

## 2016-11-05 DIAGNOSIS — M1612 Unilateral primary osteoarthritis, left hip: Secondary | ICD-10-CM | POA: Diagnosis not present

## 2016-11-05 DIAGNOSIS — R3121 Asymptomatic microscopic hematuria: Secondary | ICD-10-CM

## 2016-11-05 DIAGNOSIS — R809 Proteinuria, unspecified: Secondary | ICD-10-CM

## 2016-11-05 DIAGNOSIS — N281 Cyst of kidney, acquired: Secondary | ICD-10-CM

## 2016-11-05 LAB — POCT URINALYSIS DIP (MANUAL ENTRY)
Bilirubin, UA: NEGATIVE
Blood, UA: NEGATIVE
GLUCOSE UA: NEGATIVE
Ketones, POC UA: NEGATIVE
Nitrite, UA: NEGATIVE
Protein Ur, POC: 30 — AB
SPEC GRAV UA: 1.02
UROBILINOGEN UA: 0.2
pH, UA: 7

## 2016-11-05 LAB — POC MICROSCOPIC URINALYSIS (UMFC): Mucus: ABSENT

## 2016-11-05 MED ORDER — METHYLPREDNISOLONE ACETATE 80 MG/ML IJ SUSP: 40.0000 mg | Freq: Once | INTRAMUSCULAR | Status: DC

## 2016-11-05 NOTE — Patient Instructions (Addendum)
IF you received an x-ray today, you will receive an invoice from [facility]. Please contact [facility] at [phone] with questions or concerns regarding your invoice.   IF you received labwork today, you will receive an invoice from [facility]. Please contact [facility] at [phone] with questions or concerns regarding your invoice.   Our billing staff will not be able to assist you with questions regarding bills from these companies.  You will be contacted with the lab results as soon as they are available. The fastest way to get your results is to activate your My Chart account. Instructions are located on the last page of this paperwork. If you have not heard from [place] regarding the results in 2 weeks, please contact this office.    Trochanteric Bursitis Rehab Ask your health care provider which exercises are safe for you. Do exercises exactly as told by your health care provider and adjust them as directed. It is normal to feel mild stretching, pulling, tightness, or discomfort as you do these exercises, but you should stop right away if you feel sudden pain or your pain gets worse.Do not begin these exercises until told by your health care provider. Stretching exercises These exercises warm up your muscles and joints and improve the movement and flexibility of your hip. These exercises also help to relieve pain and stiffness. Exercise A: Iliotibial band stretch   1. Lie on your side with your left / right leg in the top position. 2. Bend your left / right knee and grab your ankle. 3. Slowly bring your knee back so your thigh is behind your body. 4. Slowly lower your knee toward the floor until you feel a gentle stretch on the outside of your left / right thigh. If you do not feel a stretch and your knee will not fall farther, place the heel of your other foot on top of your outer knee and pull your thigh down farther. 5. Hold this position for __________  seconds. 6. Slowly return to the starting position. Repeat __________ times. Complete this exercise __________ times a day. Strengthening exercises These exercises build strength and endurance in your hip and pelvis. Endurance is the ability to use your muscles for a long time, even after they get tired. Exercise B: Bridge (  hip extensors) 1. Lie on your back on a firm surface with your knees bent and your feet flat on the floor. 2. Tighten your buttocks muscles and lift your buttocks off the floor until your trunk is level with your thighs. You should feel the muscles working in your buttocks and the back of your thighs. If this exercise is too easy, try doing it with your arms crossed over your chest. 3. Hold this position for __________ seconds. 4. Slowly return to the starting position. 5. Let your muscles relax completely between repetitions. Repeat __________ times. Complete this exercise __________ times a day. Exercise C: Squats ( knee extensors and  quadriceps) 1. Stand in front of a table, with your feet and knees pointing straight ahead. You may rest your hands on the table for balance but not for support. 2. Slowly bend your knees and lower your hips like you are going to sit in a chair.  Keep your weight over your heels, not over your toes.  Keep your lower legs upright so they are parallel with the table legs.  Do not let your hips go lower than your knees.  Do not bend lower than told  by your health care provider.  If your hip pain increases, do not bend as low. 3. Hold this position for __________ seconds. 4. Slowly push with your legs to return to standing. Do not use your hands to pull yourself to standing. Repeat __________ times. Complete this exercise __________ times a day. Exercise D: Hip hike 1. Stand sideways on a bottom step. Stand on your left / right leg with your other foot unsupported next to the step. You can hold onto the railing or wall if needed for  balance. 2. Keeping your knees straight and your torso square, lift your left / right hip up toward the ceiling. 3. Hold this position for __________ seconds. 4. Slowly let your left / right hip lower toward the floor, past the starting position. Your foot should get closer to the floor. Do not lean or bend your knees. Repeat __________ times. Complete this exercise __________ times a day. Exercise E: Single leg stand 1. Stand near a counter or door frame that you can hold onto for balance as needed. It is helpful to stand in front of a mirror for this exercise so you can watch your hip. 2. Squeeze your left / right buttock muscles then lift up your other foot. Do not let your left / right hip push out to the side. 3. Hold this position for __________ seconds. Repeat __________ times. Complete this exercise __________ times a day. This information is not intended to replace advice given to you by your health care provider. Make sure you discuss any questions you have with your health care provider. Document Released: 09/29/2004 Document Revised: 04/28/2016 Document Reviewed: 08/07/2015 Elsevier Interactive Patient Education  2017 Walker Syndrome Carpal tunnel syndrome is a condition that causes pain in your hand and arm. The carpal tunnel is a narrow area located on the palm side of your wrist. Repeated wrist motion or certain diseases may cause swelling within the tunnel. This swelling pinches the main nerve in the wrist (median nerve). What are the causes? This condition may be caused by:  Repeated wrist motions.  Wrist injuries.  Arthritis.  A cyst or tumor in the carpal tunnel.  Fluid buildup during pregnancy. Sometimes the cause of this condition is not known. What increases the risk? This condition is more likely to develop in:  People who have jobs that cause them to repeatedly move their wrists in the same motion, such as Microbiologist.  Women.  People with certain conditions, such as:  Diabetes.  Obesity.  An underactive thyroid (hypothyroidism).  Kidney failure. What are the signs or symptoms? Symptoms of this condition include:  A tingling feeling in your fingers, especially in your thumb, index, and middle fingers.  Tingling or numbness in your hand.  An aching feeling in your entire arm, especially when your wrist and elbow are bent for long periods of time.  Wrist pain that goes up your arm to your shoulder.  Pain that goes down into your palm or fingers.  A weak feeling in your hands. You may have trouble grabbing and holding items. Your symptoms may feel worse during the night. How is this diagnosed? This condition is diagnosed with a medical history and physical exam. You may also have tests, including:  An electromyogram (EMG). This test measures electrical signals sent by your nerves into the muscles.  X-rays. How is this treated? Treatment for this condition includes:  Lifestyle changes. It is important to stop  doing or modify the activity that caused your condition.  Physical or occupational therapy.  Medicines for pain and inflammation. This may include medicine that is injected into your wrist.  A wrist splint.  Surgery. Follow these instructions at home: If you have a splint:   Wear it as told by your health care provider. Remove it only as told by your health care provider.  Loosen the splint if your fingers become numb and tingle, or if they turn cold and blue.  Keep the splint clean and dry. General instructions   Take over-the-counter and prescription medicines only as told by your health care provider.  Rest your wrist from any activity that may be causing your pain. If your condition is work related, talk to your employer about changes that can be made, such as getting a wrist pad to use while typing.  If directed, apply ice to the painful area:  Put ice  in a plastic bag.  Place a towel between your skin and the bag.  Leave the ice on for 20 minutes, 2-3 times per day.  Keep all follow-up visits as told by your health care provider. This is important.  Do any exercises as told by your health care provider, physical therapist, or occupational therapist. Contact a health care provider if:  You have new symptoms.  Your pain is not controlled with medicines.  Your symptoms get worse. This information is not intended to replace advice given to you by your health care provider. Make sure you discuss any questions you have with your health care provider. Document Released: 08/19/2000 Document Revised: 12/31/2015 Document Reviewed: 01/07/2015 Elsevier Interactive Patient Education  2017 [employer].

## 2016-11-05 NOTE — Progress Notes (Addendum)
By signing my name below, I, Becky Lawson, attest that this documentation has been prepared under the direction and in the presence of Becky Cheadle, MD.  Electronically Signed: Verlee Lawson, Medical Scribe. 11/05/16. 8:24 AM.  Subjective:    Patient ID: Becky Lawson, female    DOB: 05-19-1951, 65 y.o.   MRN: [id number]  HPI Chief Complaint  Patient presents with  . Follow-up    HPI Comments: Becky Lawson is a 66 y.o. female who presents to the [facility] for follow-up. She was seen 10 days prior with severe fatigue. At that time, she complained of several joint pains from a fall and so she was advised to make an appt to discuss and evaluate those for which she is here today. She is also due for a fasting lipid panel. At her last visit she was experiencing hair loss, depression, fatigue, and she felt like her bipolar was becoming uncontrolled with irritability and manic episodes as part of her sxs. She was starting to take hydroxyzine for several days and planned to follow-up with her psychiatrist Comer Locket at [facility]. We decreased her levothyroxine from 75 to 50 mcg to see if that would help. Pt is fasting.   Fall: Pt was going up the stairs when she fell 4-5 months ago injuring her left wrist, and left lateral posterior hip. She reports associated sxs of intermittent wrist and hip pain, as well as slowly improving edema located at the radial aspects on the dorsum of her wrist. Took norco for relief of her pain from initial onset, but she reports it no longer works for her pain. Her hip occasionally gives out with ambulation and this morning, she woke up with throbbing excruciating pain in her wrist so she took norco for little relief of her sxs. She mentions having carpal tunnel in both hands. She doesn't wear a brace for her carpal tunnel and hasn't recieved treatment for it.  Left Flank Pain: She also notes left sided flank discomfort. She stoped drinking  water and started drinking coffee, wine, and soda. She notes she doesn't drink "that much alcohol", and plans on drinking more water.  Bipolar: Pt reports she feels better since being on levothyroxine. She meet up with Becky Lawson 10/29/16, after her last appt, and wasn't rx'ed any medication to see how she felt while after starting a new dosage of levothyroxine. Pt is not taking Vit D supplements but she plans on taking Vit D supplements again.  Patient Active Problem List   Diagnosis Date Noted  . GERD (gastroesophageal reflux disease) 10/28/2016  . 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 Cough 06/24/2016  . S/P partial thyroidectomy 06/16/2016  . Multinodular goiter 06/02/2016  . History of hepatitis C 05/19/2015  . Vitamin D deficiency 05/19/2015  . Spinal stenosis of lumbar region 05/19/2015  . Hyperlipidemia 05/19/2015  . Bipolar disorder ([place]) 05/19/2015  . Essential hypertension 09/26/2013  . Undiagnosed cardiac murmurs 09/26/2013   Past Medical History:  Diagnosis Date  . Anxiety   . Arthritis   . Bipolar 1 disorder ([place])   . Chronic kidney disease   . Depression   . GERD (gastroesophageal reflux disease)   . Heart murmur   . Hepatitis C    resolved completely after treatment in 2014, genotype 1b, followed at [facility]  . Hyperlipidemia   . Hypertension   . Jaundice 11/01/2012  . Pruritic disorder 02/13/2013  . Sleep apnea    Was diagnosed approximately 20 years ago,  but does not wear CPAP patient stated "I gave it back.Becky KitchenMarland KitchenI couldn't wear that thing it was horrible"  . Spinal stenosis    Past Surgical History:  Procedure Laterality Date  . ABDOMINAL HYSTERECTOMY    . COLONOSCOPY W/ POLYPECTOMY    . JOINT REPLACEMENT Bilateral    knee  . KNEE ARTHROSCOPY Bilateral   . PARTIAL HYSTERECTOMY    . THYROID LOBECTOMY Left 06/16/2016  . THYROID LOBECTOMY Left 06/16/2016   Procedure: LEFT THYROID LOBECTOMY;  Surgeon: Greer Pickerel, MD;  Location: [place];  Service: General;  Laterality:  Left;   Allergies  Allergen Reactions  . No Known Allergies    Prior to Admission medications   Medication Sig Start Date End Date Taking? Authorizing Provider  ALPRAZolam Duanne Moron) 1 MG tablet Take 1 mg by mouth at bedtime as needed for anxiety.    Historical Provider, MD  amLODipine (NORVASC) 10 MG tablet Take 1 tablet (10 mg total) by mouth daily. Patient taking differently: Take 10 mg by mouth every evening.  12/01/15   Leandrew Koyanagi, MD  buPROPion (WELLBUTRIN XL) 150 MG 24 hr tablet Take 150 mg by mouth daily.  05/13/16   Historical Provider, MD  calcium carbonate 1250 MG capsule Take 1,250 mg by mouth 2 (two) times daily with a meal.    Historical Provider, MD  calcium-vitamin D (OSCAL WITH D) 500-200 MG-UNIT tablet Take 1 tablet by mouth 3 (three) times daily. 06/17/16   Greer Pickerel, MD  co-enzyme Q-10 30 MG capsule Take 30 mg by mouth 3 (three) times daily.    Historical Provider, MD  Ginkgo Biloba Extract 60 MG CAPS Take by mouth.    Historical Provider, MD  HYDROcodone-acetaminophen (NORCO) 10-325 MG tablet Take 1 tablet by mouth 3 (three) times daily.     Historical Provider, MD  hydrOXYzine (ATARAX/VISTARIL) 25 MG tablet Take 2 tablets (50 mg total) by mouth every 6 (six) hours as needed for anxiety or itching. 10/28/16   Shawnee Knapp, MD  lamoTRIgine (LAMICTAL) 200 MG tablet Take 200 mg by mouth 2 (two) times daily.    Historical Provider, MD  levothyroxine (SYNTHROID, LEVOTHROID) 50 MCG tablet Take 1 tablet (50 mcg total) by mouth daily before breakfast. 10/28/16   Shawnee Knapp, MD  Omega-3 Fatty Acids (FISH OIL ULTRA) 1400 MG CAPS Take 1,400 mg by mouth daily.    Historical Provider, MD  omeprazole (PRILOSEC) 40 MG capsule TAKE 1 BY MOUTH DAILY 08/27/16   Shawnee Knapp, MD  OXcarbazepine (TRILEPTAL) 150 MG tablet Take 150 mg by mouth at bedtime. 04/26/16   Historical Provider, MD  pravastatin (PRAVACHOL) 20 MG tablet Take 1 tablet (20 mg total) by mouth daily. 07/12/16   Shawnee Knapp, MD    traZODone (DESYREL) 100 MG tablet Take 300 mg by mouth at bedtime.  03/09/16   Historical Provider, MD  vitamin E 100 UNIT capsule Take by mouth daily.    Historical Provider, MD   Social History   Social History  . Marital status: Divorced    Spouse name: N/A  . Number of children: N/A  . Years of education: N/A   Occupational History  . Personal Caregiver    Social History Main Topics  . Smoking status: Former Smoker    Packs/day: 1.00    Years: 15.00    Quit date: 06/30/1979  . Smokeless tobacco: Never Used  . Alcohol use 0.6 oz/week    1 Standard drinks or equivalent per week  Comment: 1 glass of red wine daily  . Drug use: No  . Sexual activity: Yes    Birth control/ protection: None   Other Topics Concern  . Not on file   Social History Narrative   Divorced. Education: [employer]. Exercise: No.   Review of Systems  Musculoskeletal: Positive for arthralgias, back pain and joint swelling.  Psychiatric/Behavioral: Negative for agitation and dysphoric mood. The patient is not nervous/anxious.    Objective:  Physical Exam  Constitutional: She appears well-developed and well-nourished. No distress.  HENT:  Head: Normocephalic and atraumatic.  Eyes: Conjunctivae are normal.  Neck: Neck supple.  Cardiovascular: Normal rate.   Pulses:      Radial pulses are 2+ on the right side, and 2+ on the left side.  Radial ulnar pulses 2+ bilaterally  Pulmonary/Chest: Effort normal.  Musculoskeletal:  Moderate effusion on the dorsum of the wrist No tenderness over the ulnar aspect No tenderness over the radial aspect No tenderness over the carpal No tenderness on the squeeze No tenderness of the thumb extensor tendons of anatomical snuff box Mildly decreased flexion and mild decrease extension on the right Positive tinels bilaterally Phalen induced pain at the injury site from the forceful extension No tenderness over her sacrum or SI joint Positive tenderness over the  trochanterbursa  Neurological: She is alert.  Skin: Skin is warm and dry.  Psychiatric: She has a normal mood and affect. Her behavior is normal.  Nursing note and vitals reviewed.  BP 138/80   Pulse 88   Temp 98.3 F (36.8 C) (Oral)   Resp 18   Ht 5\' 4"  (1.626 m)   Wt 194 lb (88 kg)   SpO2 97%   BMI 33.30 kg/m    Results for orders placed or performed in visit on 11/05/16  POCT urinalysis dipstick  Result Value Ref Range   Color, UA yellow yellow   Clarity, UA clear clear   Glucose, UA negative negative   Bilirubin, UA negative negative   Ketones, POC UA negative negative   Spec Grav, UA 1.020    Blood, UA negative negative   pH, UA 7.0    Protein Ur, POC =30 (A) negative   Urobilinogen, UA 0.2    Nitrite, UA Negative Negative   Leukocytes, UA Trace (A) Negative  POCT Microscopic Urinalysis ([facility])  Result Value Ref Range   WBC,UR,HPF,POC Few (A) None WBC/hpf   RBC,UR,HPF,POC None None RBC/hpf   Bacteria None None, Too numerous to count   Mucus Absent Absent   Epithelial Cells, UR Per Microscopy Many (A) None, Too numerous to count cells/hpf   Dg Wrist Complete Left  Result Date: 11/05/2016 CLINICAL DATA:  Left wrist pain after fall several months ago. EXAM: LEFT WRIST - COMPLETE 3+ VIEW COMPARISON:  None. FINDINGS: There is no evidence of fracture or dislocation. There is no evidence of arthropathy or other focal bone abnormality. Soft tissues are unremarkable. IMPRESSION: No significant abnormality seen the left wrist. Electronically Signed   By: Marijo Conception, M.D.   On: 11/05/2016 09:09   US Soft Tissue Head And Neck  Result Date: 11/04/2016 CLINICAL DATA:  66 year old female with a history of bilateral thyroid nodules. 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 Subsequently patient underwent a left thyroid lobectomy. EXAM: THYROID ULTRASOUND TECHNIQUE: Ultrasound examination of the thyroid gland and  adjacent soft tissues was performed. COMPARISON:  Prior thyroid ultrasound 03/03/2016 FINDINGS: Parenchymal Echotexture: Mildly  heterogenous Isthmus: 0.4 cm Right lobe: 5.1 x 1.9 x 1.6 cm Left lobe: Surgically absent _________________________________________________________ Estimated total number of nodules >/= 1 cm: 0 Number of spongiform nodules >/=  2 cm not described below (TR1): 0 Number of mixed cystic and solid nodules >/= 1.5 cm not described below (TR2): 0 Nodule # 1: Prior biopsy: No Location: Right; Mid Maximum size: 0.7 cm; Other 2 dimensions: 0.5 x 0.7 bold cm Composition: cannot determine (2) Echogenicity: cannot determine (1) Shape: not taller-than-wide (0) Margins: ill-defined (0) Echogenic foci: macrocalcifications (1) + peripheral calcifications (2) ACR TI-RADS total points: 7. ACR TI-RADS risk category:  TR5 (>/= 7 points). Significant change in size (>/= 20% in two dimensions and minimal increase of 2 mm): No Change in features: No Change in ACR TI-RADS risk category: No ACR TI-RADS recommendations: *Given size (>/= 0.5 - 0.9 cm) and appearance, a follow-up ultrasound in 1 year should be considered based on TI-RADS criteria. No evidence of residual or recurrent tissue in the right thyroid bed. IMPRESSION: 1. Stable small calcified nodule (TI-RADS 5) in the right mid gland. Recommend continued annual ultrasound surveillance until 5 year stability has been confirmed. 2. Surgical changes of prior left thyroidectomy. The above is in keeping with the ACR TI-RADS recommendations - J Am Coll Radiol 2017;14:587-595. Electronically Signed   By: Jacqulynn Cadet M.D.   On: 11/04/2016 13:58   Dg Hip Unilat W Or W/o Pelvis 2-3 Views Left  Result Date: 11/05/2016 CLINICAL DATA:  Left hip pain after fall several months ago. EXAM: DG HIP (WITH OR WITHOUT PELVIS) 2-3V LEFT COMPARISON:  None. FINDINGS: There is no evidence of hip fracture or dislocation. Mild joint space narrowing and osteophyte formation is  seen involving the left hip. IMPRESSION: Mild degenerative joint disease of the left hip. No acute abnormality seen. Electronically Signed   By: Marijo Conception, M.D.   On: 11/05/2016 09:11   Left trochanteric bursa injection done.  Pt positioned in right lateral recumbant position with knees and hips at a 90 deg angle. Point of maximal tenderness identified by palpation.  Skin cleansed with EtoH x 2 followed by betadine x 2. Anesthesia with ethyl chloride cold spray.  Injected with 40mg  of DepoMedrol and 5cc of 1% lidocaine using 22g 1 1/2 in needle at right angle to bone.  Pt tolerated procedure well. No comp. No EBL.  Assessment & Plan:   at next visit - f/u in 3 wks - evaluate right shoulder which pt also injured during her fall 4-5 mos prior. She did not mention this today until the end of the visit.  Pt advised to drink plenty of water before her next visit. 1. Liver enlargement - h/o "stage 2 scarring" on liver biosy but poss of fatty liver as well, last imaging in 2014 notes liver enlarged but pt's hep C has been treated since then so need repeat US to assess liver status  2. Proteinuria, unspecified type - improved by persists, Recheck UA at next visit, check abd [place] to assess kidneys  3. Asymptomatic microscopic hematuria - resolved  4. History of hepatitis C   5. Cyst of right kidney - 2.7 cm right kidney cyst seen on imaging 2014 but pt now continuing to c/o right flank pain with new persistent proteinuria (fortunately hematuria has resolved).   6. Hyperlipidemia, unspecified hyperlipidemia type   7. Wrist injury, left, initial encounter - severe radial strain - I am quite surprised the xray was normal as she  does have a sig amount of edema, pain, and limited ROM here even 4-5 mos post-injury.  Placed in thumb spica splint and advised continual wearing x 3 weks then recheck.  8. Hip injury, left, initial encounter   9. Trochanteric bursitis, left hip - tolerated depomedrol inj well  10.     CTS - start cock-up wrist splint on right as well qhs - pt is left-handed to Lt>Rt carpal tunnel sxs  Orders Placed This Encounter  Procedures  . US Abdomen Complete    Standing Status:   Future    Standing Expiration Date:   01/05/2018    Order Specific Question:   Reason for Exam (SYMPTOM  OR DIAGNOSIS REQUIRED)    Answer:   h/o scarring and liver enlargement from fatty liver and hep C now both are treated need to reasses and right flank pain over prior area of right kidney cyst    Order Specific Question:   Preferred imaging location?    Answer:   [facility]  . DG Wrist Complete Left    Standing Status:   Future    Number of Occurrences:   1    Standing Expiration Date:   11/05/2017    Order Specific Question:   Reason for Exam (SYMPTOM  OR DIAGNOSIS REQUIRED)    Answer:   fall 4-5 mos prior wiht swelling over dorsum of radial aspect and severe pain with extension, ttp over lateral greater trochanter    Order Specific Question:   Preferred imaging location?    Answer:   External  . DG HIP UNILAT W OR W/O PELVIS 2-3 VIEWS LEFT    Standing Status:   Future    Number of Occurrences:   1    Standing Expiration Date:   11/05/2017    Order Specific Question:   Reason for Exam (SYMPTOM  OR DIAGNOSIS REQUIRED)    Answer:   fall 4-5 mos prior wiht swelling over dorsum of radial aspect and severe pain with extension, ttp over lateral greater trochanter    Order Specific Question:   Preferred imaging location?    Answer:   External  . Lipid panel    Order Specific Question:   Has the patient fasted?    Answer:   Yes  . Care order/instruction:    Scheduling Instructions:     Complete orders, AVS and go.  Becky Lawson POCT urinalysis dipstick  . POCT Microscopic Urinalysis ([facility])    Meds ordered this encounter  Medications  . methylPREDNISolone acetate (DEPO-MEDROL) injection 40 mg   Over 40 min spent in face-to-face evaluation of and consultation with patient and coordination of care.  Over  50% of this time was spent counseling this patient.   I personally performed the services described in this documentation, which was scribed in my presence. The recorded information has been reviewed and considered, and addended by me as needed.   Becky Lawson, M.D.  Primary Care at [facility] [address] [place], [place] [address] [phone] phone [phone] fax  11/05/16 12:19 PM

## 2016-11-06 LAB — LIPID PANEL
CHOLESTEROL TOTAL: 247 mg/dL — AB (ref 100–199)
Chol/HDL Ratio: 4.5 ratio units — ABNORMAL HIGH (ref 0.0–4.4)
HDL: 55 mg/dL (ref >39)
LDL CALC: 174 mg/dL — AB (ref 0–99)
Triglycerides: 88 mg/dL (ref 0–149)
VLDL CHOLESTEROL CAL: 18 mg/dL (ref 5–40)

## 2016-11-22 ENCOUNTER — Ambulatory Visit (INDEPENDENT_AMBULATORY_CARE_PROVIDER_SITE_OTHER): Payer: PPO | Admitting: Student

## 2016-11-22 DIAGNOSIS — R102 Pelvic and perineal pain: Secondary | ICD-10-CM | POA: Diagnosis not present

## 2016-11-22 NOTE — Progress Notes (Signed)
   Subjective:    Patient ID: Becky Lawson, female    DOB: 08-12-1951, 66 y.o.   MRN: [id number]  HPI Presents with 3 days of vaginal itching and pain.  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  She usually wipes after urinating, but did not notice the label.  She denies discharge, just complains of itching.  It has not improved.     PMHx - Updated and reviewed.  Contributory factors include: HTN, hep c, GERD, HLD, bipolar PSHx - Updated and reviewed.  Contributory factors include:  thyroidectomy FHx - Updated and reviewed.  Contributory factors include:  Negative Social Hx - Updated and reviewed. 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  Genitourinary: Positive for pelvic pain and vaginal pain. Negative for dysuria, menstrual problem, vaginal bleeding and vaginal discharge.  Skin: Positive for rash. Negative for wound.       Objective:   Physical Exam  Constitutional: She is oriented to person, place, and time. She appears well-developed and well-nourished. No distress.  HENT:  Head: Normocephalic and atraumatic.  Right Ear: External ear normal.  Left Ear: External ear normal.  Neck: Normal range of motion. Neck supple.  Pulmonary/Chest: Effort normal. No respiratory distress.  Genitourinary:    There is erythema and tenderness in the vagina. No bleeding in the vagina. No foreign body in the vagina. No signs of injury around the vagina. No vaginal discharge found.  Musculoskeletal: Normal range of motion. She exhibits no edema.  Neurological: She is alert and oriented to person, place, and time.  Skin: Skin is warm. No rash noted. She is not diaphoretic. No erythema.  Psychiatric: She has a normal mood and affect. Her behavior is normal. Judgment and thought content normal.  Nursing note and vitals  reviewed.  BP (!) 144/72 (BP Location: Right Arm, Patient Position: Sitting, Cuff Size: Small)   Pulse 70   Temp 98.4 F (36.9 C) (Oral)   Resp 18   Ht 5\' 4"  (1.626 m)   Wt 194 lb 9.6 oz (88.3 kg)   SpO2 96%   BMI 33.40 kg/m         Assessment & Plan:  Vaginal pain From exposure to germicide. Recommend moisturizer for vaginal area.  Gave reassurance will improve over time.  Follow up if not improving in next couple weeks.    Signed,  Balinda Quails, DO [facility] 12:00 PM

## 2016-11-22 NOTE — Patient Instructions (Signed)
     IF you received an x-ray today, you will receive an invoice from [facility]. Please contact [facility] at [phone] with questions or concerns regarding your invoice.   IF you received labwork today, you will receive an invoice from [facility]. Please contact [facility] at [phone] with questions or concerns regarding your invoice.   Our billing staff will not be able to assist you with questions regarding bills from these companies.  You will be contacted with the lab results as soon as they are available. The fastest way to get your results is to activate your My Chart account. Instructions are located on the last page of this paperwork. If you have not heard from us regarding the results in 2 weeks, please contact this office.     

## 2016-11-22 NOTE — Assessment & Plan Note (Addendum)
From exposure to germicide. Recommend moisturizer for vaginal area.  Gave reassurance will improve over time.  Follow up if not improving in next couple weeks.

## 2016-11-29 ENCOUNTER — Ambulatory Visit
Admission: RE | Admit: 2016-11-29 | Discharge: 2016-11-29 | Disposition: A | Discharge status: Home / Self Care | Payer: PPO | Source: Ambulatory Visit | Attending: Family Medicine | Admitting: Family Medicine

## 2016-11-29 DIAGNOSIS — N281 Cyst of kidney, acquired: Secondary | ICD-10-CM

## 2016-11-29 DIAGNOSIS — K760 Fatty (change of) liver, not elsewhere classified: Secondary | ICD-10-CM | POA: Diagnosis not present

## 2016-11-29 DIAGNOSIS — Z8619 Personal history of other infectious and parasitic diseases: Secondary | ICD-10-CM

## 2016-11-29 DIAGNOSIS — R160 Hepatomegaly, not elsewhere classified: Secondary | ICD-10-CM

## 2016-11-29 DIAGNOSIS — R809 Proteinuria, unspecified: Secondary | ICD-10-CM

## 2016-11-29 DIAGNOSIS — R3121 Asymptomatic microscopic hematuria: Secondary | ICD-10-CM

## 2016-11-30 ENCOUNTER — Other Ambulatory Visit: Payer: Self-pay | Admitting: Family Medicine

## 2016-12-16 ENCOUNTER — Other Ambulatory Visit: Payer: Self-pay

## 2016-12-16 MED ORDER — LEVOTHYROXINE SODIUM 50 MCG PO TABS: 50.0000 ug | ORAL_TABLET | Freq: Every day | ORAL | 1 refills | Status: DC

## 2016-12-17 ENCOUNTER — Other Ambulatory Visit: Payer: Self-pay | Admitting: Emergency Medicine

## 2016-12-17 DIAGNOSIS — I10 Essential (primary) hypertension: Secondary | ICD-10-CM

## 2016-12-17 MED ORDER — PRAVASTATIN SODIUM 20 MG PO TABS: 20.0000 mg | ORAL_TABLET | Freq: Every day | ORAL | 3 refills | Status: DC

## 2016-12-17 MED ORDER — AMLODIPINE BESYLATE 10 MG PO TABS: 10.0000 mg | ORAL_TABLET | Freq: Every day | ORAL | 3 refills | Status: DC

## 2016-12-17 MED ORDER — HYDROXYZINE HCL 25 MG PO TABS: 50.0000 mg | ORAL_TABLET | Freq: Four times a day (QID) | ORAL | 0 refills | Status: DC | PRN

## 2016-12-19 ENCOUNTER — Ambulatory Visit: Payer: PPO

## 2016-12-19 ENCOUNTER — Ambulatory Visit (INDEPENDENT_AMBULATORY_CARE_PROVIDER_SITE_OTHER): Payer: PPO | Admitting: Physician Assistant

## 2016-12-19 ENCOUNTER — Ambulatory Visit (INDEPENDENT_AMBULATORY_CARE_PROVIDER_SITE_OTHER): Payer: PPO

## 2016-12-19 VITALS — BP 156/76 | HR 78 | Temp 98.2°F | Resp 17 | Ht 64.0 in | Wt 199.0 lb

## 2016-12-19 DIAGNOSIS — M79644 Pain in right finger(s): Secondary | ICD-10-CM | POA: Diagnosis not present

## 2016-12-19 DIAGNOSIS — R9389 Abnormal findings on diagnostic imaging of other specified body structures: Secondary | ICD-10-CM

## 2016-12-19 DIAGNOSIS — M25531 Pain in right wrist: Secondary | ICD-10-CM | POA: Diagnosis not present

## 2016-12-19 DIAGNOSIS — S6992XA Unspecified injury of left wrist, hand and finger(s), initial encounter: Secondary | ICD-10-CM | POA: Diagnosis not present

## 2016-12-19 DIAGNOSIS — F319 Bipolar disorder, unspecified: Secondary | ICD-10-CM | POA: Diagnosis not present

## 2016-12-19 DIAGNOSIS — M25532 Pain in left wrist: Secondary | ICD-10-CM

## 2016-12-19 DIAGNOSIS — M25541 Pain in joints of right hand: Secondary | ICD-10-CM

## 2016-12-19 DIAGNOSIS — W19XXXA Unspecified fall, initial encounter: Secondary | ICD-10-CM

## 2016-12-19 DIAGNOSIS — M7989 Other specified soft tissue disorders: Secondary | ICD-10-CM | POA: Diagnosis not present

## 2016-12-19 DIAGNOSIS — R938 Abnormal findings on diagnostic imaging of other specified body structures: Secondary | ICD-10-CM

## 2016-12-19 DIAGNOSIS — S6991XA Unspecified injury of right wrist, hand and finger(s), initial encounter: Secondary | ICD-10-CM | POA: Diagnosis not present

## 2016-12-19 NOTE — Patient Instructions (Addendum)
For the right thumb pain, it is likely just sprained. I would like you to keep a brace on it until the pain and swelling resolve. Continue icing it 4-5 x a day for 20 minutes at time. Return to clinic if the pain starts to worsen or does not resolve in 1 week. For the left wrist pain, continue wearing the brace. I have placed an urgent referral to ortho and they should contact you in the next 1-2 days for an appointment.  In terms of falling and hitting your head, it is likely that you may experience concussion signs after having a fall like this. If you start to have visual disturbance, nausea, vomiting, headache, decreased concentration, or confusion please seek care immediately.    Post-Concussion Syndrome Post-concussion syndrome is the symptoms that can occur after a head injury. These symptoms can last from weeks to months. Follow these instructions at home:  Take medicines only as told by your doctor.  Do not take aspirin.  Sleep with your head raised to help with headaches.  Avoid activities that can cause another head injury.  Do not play contact sports like football, hockey, soccer, or basketball.  Do not do other risky activities like downhill skiing, martial arts, or horseback riding until your doctor says it is okay.  Keep all follow-up visits as told by your doctor. This is important. Contact a doctor if:  You have a harder time:  Paying attention.  Focusing.  Remembering.  Learning new information.  Dealing with stress.  You need more time to complete tasks.  You are easily bothered (irritable).  You have more symptoms. Get help if you have any of these symptoms for more than two weeks after your injury:  Long-lasting (chronic) headaches.  Dizziness.  Trouble balancing.  Feeling sick to your stomach (nauseous).  Trouble with your vision.  Noise or light bothers you more.  Depression.  Mood swings.  Feeling worried (anxious).  Easily  bothered.  Memory problems.  Trouble concentrating or paying attention.  Sleep problems.  Feeling tired all of the time. Get help right away if:  You feel confused.  You feel very sleepy.  You are hard to wake up.  You feel sick to your stomach.  You keep throwing up (vomiting).  You feel like you are moving when you are not (vertigo).  Your eyes move back and forth very quickly.  You start shaking (convulsing) or pass out (faint).  You have very bad headaches that do not get better with medicine.  You cannot use your arms or legs like normal.  One of the black centers of your eyes (pupils) is bigger than the other.  You have clear or bloody fluid coming from your nose or ears.  Your problems get worse, not better. This information is not intended to replace advice given to you by your health care provider. Make sure you discuss any questions you have with your health care provider. Document Released: 09/29/2004 Document Revised: 01/28/2016 Document Reviewed: 11/27/2013 Elsevier Interactive Patient Education  2017 [employer].     IF you received an x-ray today, you will receive an invoice from [facility]. Please contact [facility] at [phone] with questions or concerns regarding your invoice.   IF you received labwork today, you will receive an invoice from [facility]. Please contact [facility] at [phone] with questions or concerns regarding your invoice.   Our billing staff will not be able to assist you with questions regarding bills from these  companies.  You will be contacted with the lab results as soon as they are available. The fastest way to get your results is to activate your My Chart account. Instructions are located on the last page of this paperwork. If you have not heard from [place] regarding the results in 2 weeks, please contact this office.

## 2016-12-19 NOTE — Progress Notes (Addendum)
Teesha Ohm  MRN: [id number] DOB: 16-May-1951  Subjective:  Becky Lawson is a 66 y.o. female seen in office today for a chief complaint of fall 3 days ago. Notes she tripped going down a ramp and fell onto her right hand. She did hit the back of her head but denies LOC, headache, nausea, vomiting, visual problems, confusion, dizziness, and tinnitus. States she immediately got up and her right thumb was painful and swollen. She then noticed bruising later that day. She has tried ice and pain medication with no relief. Denies numbness, tingling, and loss of ROM.   She is also still having pain in left wrist that she had examined by Dr. Brigitte Pulse 3 weeks ago. She had fallen down the stairs and injured her left wrist. She had edema and pain over the wrist. Notes the swelling has gone down but the pain is still there. She has been wearing a brace consistently. She is requesting a repeat xray.   Review of Systems  Constitutional: Negative for activity change and diaphoresis.  Eyes: Negative for photophobia and visual disturbance.  Cardiovascular: Negative for chest pain and palpitations.  Gastrointestinal: Negative for abdominal pain, nausea and vomiting.  Neurological: Negative for dizziness, tremors, seizures, syncope, speech difficulty, weakness, light-headedness and headaches.  Hematological: Bruises/bleeds easily:    Psychiatric/Behavioral: Negative for confusion, decreased concentration and sleep disturbance.    Patient Active Problem List   Diagnosis Date Noted  . Vaginal pain 11/22/2016  . GERD (gastroesophageal reflux disease) 10/28/2016  . 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 Cough 06/24/2016  . S/P partial thyroidectomy 06/16/2016  . Multinodular goiter 06/02/2016  . History of hepatitis C 05/19/2015  . Vitamin D deficiency 05/19/2015  . Spinal stenosis of lumbar region 05/19/2015  . Hyperlipidemia 05/19/2015  . Bipolar disorder ([place]) 05/19/2015  . Essential hypertension  09/26/2013  . Undiagnosed cardiac murmurs 09/26/2013    Current Outpatient Prescriptions on File Prior to Visit  Medication Sig Dispense Refill  . ALPRAZolam (XANAX) 1 MG tablet Take 1 mg by mouth at bedtime as needed for anxiety.    [name] amLODipine (NORVASC) 10 MG tablet Take 1 tablet (10 mg total) by mouth daily. 90 tablet 3  . buPROPion (WELLBUTRIN XL) 150 MG 24 hr tablet Take 150 mg by mouth daily.     . calcium carbonate 1250 MG capsule Take 1,250 mg by mouth 2 (two) times daily with a meal.    . calcium-vitamin D (OSCAL WITH D) 500-200 MG-UNIT tablet Take 1 tablet by mouth 3 (three) times daily.    [name] co-enzyme Q-10 30 MG capsule Take 30 mg by mouth 3 (three) times daily.    . Ginkgo Biloba Extract 60 MG CAPS Take by mouth.    [name] HYDROcodone-acetaminophen (NORCO) 10-325 MG tablet Take 1 tablet by mouth 3 (three) times daily.     . hydrOXYzine (ATARAX/VISTARIL) 25 MG tablet Take 2 tablets (50 mg total) by mouth every 6 (six) hours as needed for anxiety or itching. 240 tablet 0  . lamoTRIgine (LAMICTAL) 200 MG tablet Take 200 mg by mouth 2 (two) times daily.    [name] levothyroxine (SYNTHROID, LEVOTHROID) 50 MCG tablet Take 1 tablet (50 mcg total) by mouth daily before breakfast. 90 tablet 1  . Omega-3 Fatty Acids (FISH OIL ULTRA) 1400 MG CAPS Take 1,400 mg by mouth daily.    [name] omeprazole (PRILOSEC) 40 MG capsule TAKE 1 CAPSULE BY MOUTH EVERY DAY 90 capsule 0  . OXcarbazepine (TRILEPTAL) 150 MG tablet Take 150  mg by mouth at bedtime.    . pravastatin (PRAVACHOL) 20 MG tablet Take 1 tablet (20 mg total) by mouth daily. 90 tablet 3  . traZODone (DESYREL) 100 MG tablet Take 300 mg by mouth at bedtime.     . vitamin E 100 UNIT capsule Take by mouth daily.     Current Facility-Administered Medications on File Prior to Visit  Medication Dose Route Frequency Provider Last Rate Last Dose  . methylPREDNISolone acetate (DEPO-MEDROL) injection 40 mg  40 mg Intramuscular Once Shawnee Knapp, MD         Allergies  Allergen Reactions  . No Known Allergies      Objective:  BP (!) 156/76 (BP Location: Right Arm, Patient Position: Sitting, Cuff Size: Large)   Pulse 78   Temp 98.2 F (36.8 C) (Oral)   Resp 17   Ht 5\' 4"  (1.626 m)   Wt 199 lb (90.3 kg)   SpO2 92%   BMI 34.16 kg/m   Physical Exam  Constitutional: She is oriented to person, place, and time and well-developed, well-nourished, and in no distress. No distress.  HENT:  Head: Normocephalic and atraumatic.    Right Ear: Tympanic membrane, external ear and ear canal normal.  Left Ear: Tympanic membrane, external ear and ear canal normal.  Eyes: Conjunctivae are normal. Pupils are equal, round, and reactive to light.  Neck: Normal range of motion.  Cardiovascular: Normal rate, regular rhythm and normal heart sounds.   Pulmonary/Chest: Effort normal and breath sounds normal. She has no wheezes. She has no rales.  Musculoskeletal:       Right wrist: She exhibits tenderness. She exhibits normal range of motion and no swelling.       Left wrist: She exhibits bony tenderness (with palpation of the anatomical snuffbox) and swelling (mild ). She exhibits normal range of motion.       Right hand: She exhibits decreased range of motion (with flexion of thumb), tenderness, bony tenderness (with palpation of thumb) and swelling (of right thumb with ecchymosis ). She exhibits normal capillary refill. Decreased sensation noted. Normal strength noted.  Neurological: She is alert and oriented to person, place, and time. She has normal sensation, normal strength, normal reflexes and intact cranial nerves. She displays no weakness and facial symmetry. She has a normal Cerebellar Exam, a normal Finger-Nose-Finger Test, a normal Heel to [employer], a normal Romberg Test and a normal Tandem Gait Test. Gait normal.  Reflex Scores:      Tricep reflexes are 2+ on the right side and 2+ on the left side.      Bicep reflexes are 2+ on the right side  and 2+ on the left side.      Brachioradialis reflexes are 2+ on the right side and 2+ on the left side.      Patellar reflexes are 2+ on the right side and 2+ on the left side.      Achilles reflexes are 2+ on the right side and 2+ on the left side. Skin: Skin is warm and dry.  Psychiatric: Affect normal.  Vitals reviewed.   Dg Wrist 2 Views Left  Result Date: 12/19/2016 CLINICAL DATA:  Status post fall, pain. EXAM: LEFT WRIST - 2 VIEW COMPARISON:  Plain film of the left wrist dated 11/05/2016. FINDINGS: Osseous alignment is stable. No acute appearing fracture line or displaced fracture fragment identified. Probable old fracture of the scaphoid bone. Soft tissues about the left wrist are unremarkable. Degenerative joint  space narrowing and ankylosis noted at the the lunate-capitate joint space, with probable associated degenerative subchondral cyst within the proximal capitate. IMPRESSION: 1. No acute findings. 2. Probable old fracture of the scaphoid bone. 3. Degenerative changes at the lunate-capitate joint space, as detailed above. Electronically Signed   By: Franki Cabot M.D.   On: 12/19/2016 16:46   Dg Wrist 2 Views Right  Result Date: 12/19/2016 CLINICAL DATA:  Fall on hand 3 days ago, right thumb swelling. Initial encounter. EXAM: RIGHT WRIST - 2 VIEW COMPARISON:  None. FINDINGS: No acute osseous or joint abnormality. There are advanced degenerative changes at the first carpometacarpal joint. IMPRESSION: 1. No definite fracture. 2. First carpometacarpal joint osteoarthritis. Electronically Signed   By: Lorin Picket M.D.   On: 12/19/2016 16:42   Dg Finger Thumb Right  Result Date: 12/19/2016 CLINICAL DATA:  fall on hand 3 days ago, swelling of right thumb EXAM: RIGHT THUMB 2+V COMPARISON:  None. FINDINGS: No fracture line or displaced fracture fragment identified. Adjacent soft tissues are unremarkable. Degenerative osteoarthritis noted at the first [facility] joint, at least moderate in  degree with associated joint space narrowing and osteophyte formation, and associated slight radial subluxation of the first metacarpal bone. IMPRESSION: 1. No acute findings. 2. 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 Electronically Signed   By: Franki Cabot M.D.   On: 12/19/2016 16:41    Assessment and Plan :   1. Pain of right thumb 2. Fall, initial encounter Likely sprained. Applied ACE bandage in office today. Instructed to return to clinic if pain and swelling persists despite tx with compression and ice. There are no acute neurological findings. Pt given information on post concussion syndrome. Given strict ED and return precautions.  - DG Finger Thumb Right; Future - DG Wrist 2 Views Right; Future  3. Abnormal x-ray The initial xray of the left wrist showed no significant abnormality seen the left wrist on 11/05/16. However, today's image shows "probable old scaphoid fracture." Will refer to ortho for further evaluation.  - Ambulatory referral to Orthopedic Surgery  4. Left wrist pain - DG Wrist 2 Views Left; Future - Ambulatory referral to [place] PA-C  [facility] 12/19/2016 4:50 PM

## 2016-12-21 ENCOUNTER — Other Ambulatory Visit: Payer: Self-pay

## 2016-12-21 ENCOUNTER — Other Ambulatory Visit: Payer: Self-pay | Admitting: Endocrinology

## 2016-12-21 MED ORDER — LEVOTHYROXINE SODIUM 75 MCG PO TABS: 75.0000 ug | ORAL_TABLET | Freq: Every day | ORAL | 3 refills | Status: DC

## 2016-12-22 ENCOUNTER — Ambulatory Visit (INDEPENDENT_AMBULATORY_CARE_PROVIDER_SITE_OTHER): Payer: PPO | Admitting: Orthopaedic Surgery

## 2016-12-22 DIAGNOSIS — M1811 Unilateral primary osteoarthritis of first carpometacarpal joint, right hand: Secondary | ICD-10-CM | POA: Diagnosis not present

## 2016-12-22 DIAGNOSIS — M19132 Post-traumatic osteoarthritis, left wrist: Secondary | ICD-10-CM | POA: Diagnosis not present

## 2016-12-23 NOTE — Progress Notes (Unsigned)
I have changed the primary diagnosis.

## 2017-01-17 DIAGNOSIS — F319 Bipolar disorder, unspecified: Secondary | ICD-10-CM | POA: Diagnosis not present

## 2017-01-20 DIAGNOSIS — F319 Bipolar disorder, unspecified: Secondary | ICD-10-CM | POA: Diagnosis not present

## 2017-01-29 ENCOUNTER — Emergency Department (HOSPITAL_COMMUNITY)
Admission: EM | Admit: 2017-01-29 | Discharge: 2017-01-30 | Disposition: A | Discharge status: Home / Self Care | Payer: PPO | Attending: Emergency Medicine | Admitting: Emergency Medicine

## 2017-01-29 DIAGNOSIS — Z96653 Presence of artificial knee joint, bilateral: Secondary | ICD-10-CM | POA: Insufficient documentation

## 2017-01-29 DIAGNOSIS — W01198A Fall on same level from slipping, tripping and stumbling with subsequent striking against other object, initial encounter: Secondary | ICD-10-CM | POA: Insufficient documentation

## 2017-01-29 DIAGNOSIS — Z7902 Long term (current) use of antithrombotics/antiplatelets: Secondary | ICD-10-CM | POA: Diagnosis not present

## 2017-01-29 DIAGNOSIS — S0101XA Laceration without foreign body of scalp, initial encounter: Secondary | ICD-10-CM

## 2017-01-29 DIAGNOSIS — E871 Hypo-osmolality and hyponatremia: Secondary | ICD-10-CM | POA: Insufficient documentation

## 2017-01-29 DIAGNOSIS — S0990XA Unspecified injury of head, initial encounter: Secondary | ICD-10-CM | POA: Diagnosis not present

## 2017-01-29 DIAGNOSIS — Z87891 Personal history of nicotine dependence: Secondary | ICD-10-CM | POA: Insufficient documentation

## 2017-01-29 DIAGNOSIS — Z23 Encounter for immunization: Secondary | ICD-10-CM | POA: Diagnosis not present

## 2017-01-29 DIAGNOSIS — Y92002 Bathroom of unspecified non-institutional (private) residence single-family (private) house as the place of occurrence of the external cause: Secondary | ICD-10-CM | POA: Insufficient documentation

## 2017-01-29 DIAGNOSIS — Z79899 Other long term (current) drug therapy: Secondary | ICD-10-CM | POA: Diagnosis not present

## 2017-01-29 DIAGNOSIS — Y9301 Activity, walking, marching and hiking: Secondary | ICD-10-CM | POA: Diagnosis not present

## 2017-01-29 DIAGNOSIS — I129 Hypertensive chronic kidney disease with stage 1 through stage 4 chronic kidney disease, or unspecified chronic kidney disease: Secondary | ICD-10-CM | POA: Insufficient documentation

## 2017-01-29 DIAGNOSIS — N189 Chronic kidney disease, unspecified: Secondary | ICD-10-CM | POA: Diagnosis not present

## 2017-01-29 DIAGNOSIS — Y999 Unspecified external cause status: Secondary | ICD-10-CM | POA: Diagnosis not present

## 2017-01-29 LAB — BASIC METABOLIC PANEL
ANION GAP: 8 (ref 5–15)
BUN: 9 mg/dL (ref 6–20)
CHLORIDE: 95 mmol/L — AB (ref 101–111)
CO2: 22 mmol/L (ref 22–32)
Calcium: 9.4 mg/dL (ref 8.9–10.3)
Creatinine, Ser: 0.74 mg/dL (ref 0.44–1.00)
GFR calc non Af Amer: >60 mL/min (ref >60)
GLUCOSE: 105 mg/dL — AB (ref 65–99)
POTASSIUM: 4.2 mmol/L (ref 3.5–5.1)
Sodium: 125 mmol/L — ABNORMAL LOW (ref 135–145)

## 2017-01-29 LAB — CBC
HEMATOCRIT: 40 % (ref 36.0–46.0)
Hemoglobin: 14 g/dL (ref 12.0–15.0)
MCH: 30.9 pg (ref 26.0–34.0)
MCHC: 35 g/dL (ref 30.0–36.0)
MCV: 88.3 fL (ref 78.0–100.0)
Platelets: 216 10*3/uL (ref 150–400)
RBC: 4.53 MIL/uL (ref 3.87–5.11)
RDW: 11.7 % (ref 11.5–15.5)
WBC: 5.8 10*3/uL (ref 4.0–10.5)

## 2017-01-29 LAB — I-STAT TROPONIN, ED: TROPONIN I, POC: 0 ng/mL (ref 0.00–0.08)

## 2017-01-29 MED ORDER — SODIUM CHLORIDE 0.9 % IV SOLN
Freq: Once | INTRAVENOUS | Status: AC
  Administered 2017-01-29: 23:00:00 via INTRAVENOUS

## 2017-01-29 MED ORDER — TETANUS-DIPHTH-ACELL PERTUSSIS 5-2.5-18.5 LF-MCG/0.5 IM SUSP
0.5000 mL | Freq: Once | INTRAMUSCULAR | Status: AC
  Administered 2017-01-29: 0.5 mL via INTRAMUSCULAR
  Filled 2017-01-29: qty 0.5

## 2017-01-29 MED ORDER — SODIUM CHLORIDE 0.9 % IV BOLUS (SEPSIS): 1000.0000 mL | Freq: Once | INTRAVENOUS | Status: DC

## 2017-01-29 NOTE — ED Triage Notes (Signed)
Patient arrives with initial concern that she may have a low sodium level. States she has had this problem before which caused her to lose consciousness. Last night the same occurred; she fell in the bathroom and struck her head on the sink. Significant laceration to left lateral posterior head. No bleeding present, hair is clean, but patient states she bathed afterward.

## 2017-01-29 NOTE — Discharge Instructions (Signed)
Please read and follow all provided instructions.  Your diagnoses today include:  1. Injury of head, initial encounter   2. Laceration of scalp, initial encounter   3. Hyponatremia     Tests performed today include: Vital signs. See below for your results today.   Medications prescribed:  Take as prescribed   Home care instructions:  Follow any educational materials contained in this packet.  Follow-up instructions: Please follow-up with your primary care provider for further evaluation of symptoms and treatment   Return instructions:  Please return to the Emergency Department if you do not get better, if you get worse, or new symptoms OR  - Fever (temperature greater than 101.16F)  - Bleeding that does not stop with holding pressure to the area    -Severe pain (please note that you may be more sore the day after your accident)  - Chest Pain  - Difficulty breathing  - Severe nausea or vomiting  - Inability to tolerate food and liquids  - Passing out  - Skin becoming red around your wounds  - Change in mental status (confusion or lethargy)  - New numbness or weakness    Please return if you have any other emergent concerns.  Additional Information:  Your vital signs today were: BP (!) 167/84    Pulse 83    Temp 98.1 F (36.7 C) (Oral)    Resp 20    Ht 5\' 5"  (1.651 m)    Wt 89.8 kg (198 lb)    SpO2 97%    BMI 32.95 kg/m  If your blood pressure (BP) was elevated above 135/85 this visit, please have this repeated by your doctor within one month. ---------------

## 2017-01-29 NOTE — ED Provider Notes (Signed)
[facility] DEPT Provider Note   CSN: [id number] Arrival date & time: 01/29/17  2058     History   Chief Complaint Chief Complaint  Patient presents with  . Fall  . Head Injury    HPI Becky Lawson is a 66 y.o. female.  HPI  66 y.o. female with a hx of CKD, HTN, HLD, presents to the Emergency Department today due to mechanical fall yesterday. Pt states that she was ambulating her her house when she felt like she tripped on something and proceeded to fall backwards trying to catch herself and struck her head. Notes no LOC. No visual changes. No CP/SOB/ABD pain. No N/V. Noted problem in the past before when her sodium is low. Pt sees PCP for this. States that this is likely due to her lack of appetite. Pt states that the fall occurred last night around 10pm. Noted initially bleeding, but controlled with direct pressure. Pt decided to come in due to laceration on the back of her head and wanted to make sure everything was ok. No other symptoms noted.   Past Medical History:  Diagnosis Date  . Anxiety   . Arthritis   . Bipolar 1 disorder ([place])   . Chronic kidney disease   . Depression   . GERD (gastroesophageal reflux disease)   . Heart murmur   . Hepatitis C    resolved completely after treatment in 2014, genotype 1b, followed at [facility]  . Hyperlipidemia   . Hypertension   . Jaundice 11/01/2012  . Pruritic disorder 02/13/2013  . Sleep apnea    Was diagnosed approximately 20 years ago, but does not wear CPAP patient stated "I gave it back.Marland KitchenMarland KitchenI couldn't wear that thing it was horrible"  . Spinal stenosis     Patient Active Problem List   Diagnosis Date Noted  . Vaginal pain 11/22/2016  . GERD (gastroesophageal reflux disease) 10/28/2016  . 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 Cough 06/24/2016  . S/P partial thyroidectomy 06/16/2016  . Multinodular goiter 06/02/2016  . History of hepatitis C 05/19/2015  . Vitamin D deficiency 05/19/2015  . Spinal stenosis of lumbar  region 05/19/2015  . Hyperlipidemia 05/19/2015  . Bipolar disorder ([place]) 05/19/2015  . Essential hypertension 09/26/2013  . Undiagnosed cardiac murmurs 09/26/2013    Past Surgical History:  Procedure Laterality Date  . ABDOMINAL HYSTERECTOMY    . COLONOSCOPY W/ POLYPECTOMY    . JOINT REPLACEMENT Bilateral    knee  . KNEE ARTHROSCOPY Bilateral   . PARTIAL HYSTERECTOMY    . THYROID LOBECTOMY Left 06/16/2016  . THYROID LOBECTOMY Left 06/16/2016   Procedure: LEFT THYROID LOBECTOMY;  Surgeon: Greer Pickerel, MD;  Location: [facility];  Service: General;  Laterality: Left;    OB History    No data available       Home Medications    Prior to Admission medications   Medication Sig Start Date End Date Taking? Authorizing Provider  ALPRAZolam Duanne Moron) 1 MG tablet Take 1 mg by mouth at bedtime as needed for anxiety.    [provider]  amLODipine (NORVASC) 10 MG tablet Take 1 tablet (10 mg total) by mouth daily. 12/17/16   Shawnee Knapp, MD  buPROPion (WELLBUTRIN XL) 150 MG 24 hr tablet Take 150 mg by mouth daily.  05/13/16   [provider]  calcium carbonate 1250 MG capsule Take 1,250 mg by mouth 2 (two) times daily with a meal.    [provider]  calcium-vitamin D (OSCAL WITH D) 500-200 MG-UNIT  tablet Take 1 tablet by mouth 3 (three) times daily. 06/17/16   Greer Pickerel, MD  co-enzyme Q-10 30 MG capsule Take 30 mg by mouth 3 (three) times daily.    [provider]  Ginkgo Biloba Extract 60 MG CAPS Take by mouth.    [provider]  HYDROcodone-acetaminophen (NORCO) 10-325 MG tablet Take 1 tablet by mouth 3 (three) times daily.     [provider]  hydrOXYzine (ATARAX/VISTARIL) 25 MG tablet Take 2 tablets (50 mg total) by mouth every 6 (six) hours as needed for anxiety or itching. 12/17/16   Shawnee Knapp, MD  lamoTRIgine (LAMICTAL) 200 MG tablet Take 200 mg by mouth 2 (two) times daily.    [provider]  levothyroxine (SYNTHROID,  LEVOTHROID) 75 MCG tablet Take 1 tablet (75 mcg total) by mouth daily before breakfast. 12/21/16   Renato Shin, MD  Omega-3 Fatty Acids (FISH OIL ULTRA) 1400 MG CAPS Take 1,400 mg by mouth daily.    [provider]  omeprazole (PRILOSEC) 40 MG capsule TAKE 1 CAPSULE BY MOUTH EVERY DAY 12/01/16   Weber, Damaris Hippo, PA-C  OXcarbazepine (TRILEPTAL) 150 MG tablet Take 150 mg by mouth at bedtime. 04/26/16   [provider]  pravastatin (PRAVACHOL) 20 MG tablet Take 1 tablet (20 mg total) by mouth daily. 12/17/16   Shawnee Knapp, MD  traZODone (DESYREL) 100 MG tablet Take 300 mg by mouth at bedtime.  03/09/16   [provider]  vitamin E 100 UNIT capsule Take by mouth daily.    [provider]    Family History Family History  Problem Relation Age of Onset  . Diabetes Mother   . Heart disease Mother   . Hyperlipidemia Mother   . Hypertension Mother   . Stroke Mother   . Hypertension Father   . Parkinson's disease Father   . Heart disease Father   . Hyperlipidemia Father   . Hemachromatosis Daughter   . Thyroid disease Neg Hx     Social History Social History  Substance Use Topics  . Smoking status: Former Smoker    Packs/day: 1.00    Years: 15.00    Quit date: 06/30/1979  . Smokeless tobacco: Never Used  . Alcohol use 0.6 oz/week    1 Standard drinks or equivalent per week     Comment: 1 glass of red wine daily     Allergies   No known allergies   Review of Systems Review of Systems ROS reviewed and all are negative for acute change except as noted in the HPI.  Physical Exam Updated Vital Signs BP (!) 161/90 (BP Location: Right Arm)   Pulse 94   Temp 98.1 F (36.7 C) (Oral)   Resp 16   Ht 5\' 5"  (1.651 m)   Wt 89.8 kg (198 lb)   SpO2 97%   BMI 32.95 kg/m   Physical Exam  Constitutional: She is oriented to person, place, and time. Vital signs are normal. She appears well-developed and well-nourished.  HENT:  Head: Normocephalic and  atraumatic.  Right Ear: Hearing normal.  Left Ear: Hearing normal.  Superficial Linear laceration to posterior scalp approx 5-6cm. Bleeding controlled. Bottom of wound visualized.   Eyes: Conjunctivae and EOM are normal. Pupils are equal, round, and reactive to light.  Neck: Normal range of motion. Neck supple.  Cardiovascular: Normal rate, regular rhythm, normal heart sounds and intact distal pulses.   Pulmonary/Chest: Effort normal and breath sounds normal.  Abdominal: Soft.  There is no tenderness.  Musculoskeletal: Normal range of motion.  Neurological: She is alert and oriented to person, place, and time. She has normal strength. No cranial nerve deficit or sensory deficit.  Cranial Nerves:  II: Pupils equal, round, reactive to light III,IV, VI: ptosis not present, extra-ocular motions intact bilaterally  V,VII: smile symmetric, facial light touch sensation equal VIII: hearing grossly normal bilaterally  IX,X: midline uvula rise  XI: bilateral shoulder shrug equal and strong XII: midline tongue extension  Skin: Skin is warm and dry.  Psychiatric: She has a normal mood and affect. Her speech is normal and behavior is normal. Thought content normal.  Nursing note and vitals reviewed.    ED Treatments / Results  Labs (all labs ordered are listed, but only abnormal results are displayed) Labs Reviewed  BASIC METABOLIC PANEL - Abnormal; Notable for the following:       Result Value   Sodium 125 (*)    Chloride 95 (*)    Glucose, Bld 105 (*)    All other components within normal limits  CBC  I-STAT TROPOININ, ED    EKG  EKG Interpretation None       Radiology No results found.  Procedures Procedures (including critical care time)  Medications Ordered in ED Medications  Tdap (BOOSTRIX) injection 0.5 mL (not administered)  0.9 %  sodium chloride infusion ( Intravenous New Bag/Given 01/29/17 2308)   Initial Impression / Assessment and Plan / ED Course  I have  reviewed the triage vital signs and the nursing notes.  Pertinent labs & imaging results that were available during my care of the patient were reviewed by me and considered in my medical decision making (see chart for details).  Final Clinical Impressions(s) / ED Diagnoses  {I have reviewed and evaluated the relevant laboratory values.   {I have reviewed the relevant previous healthcare records.  {I obtained HPI from historian. {Patient discussed with supervising physician.  ED Course:  Assessment: Patient is a 66 y.o. female that presents with laceration to posterior scalp due to mechanical fall last night around 10pm. No N/V. No LOC. No Vision changes. Asymptomatic currently. No headache. No CP/SOB/ABD pain. Tdap booster given. Bottom of the wound visualized with bleeding controlled. Pt had cleaned thoroughly last night and controlled bleed with direct pressure. Does not take blood thinners. Laceration occurred >24 hours with repair not indicated. Wound is well approximated already. Counseled patient on wound care. Pt has no co morbidities to effect normal wound healing. Pt did endorse hx of Hyponatremia. States that it gets low due to lack of appetite. Seen by PCP for this. Current Sodium 125. Given NS bolus. Encouraged to put salt in food. Pt is hemodynamically stable w no complaints prior to dc. CN evaluated and unremarkable. At time of discharge, Patient is in no acute distress. Vital Signs are stable. Patient is able to ambulate. Patient able to tolerate PO.    Disposition/Plan:  DC Home Additional Verbal discharge instructions given and discussed with patient.  Pt Instructed to f/u with PCP in the next week for evaluation and treatment of symptoms. Return precautions given Pt acknowledges and agrees with plan  Supervising Physician Horton, Barbette Hair, MD  Final diagnoses:  Injury of head, initial encounter  Laceration of scalp, initial encounter  Hyponatremia    New  Prescriptions New Prescriptions   No medications on file     Shary Decamp, Hershal Coria 01/29/17 2348    Orlie Dakin, MD 01/30/17 [id number]

## 2017-02-07 DIAGNOSIS — M5136 Other intervertebral disc degeneration, lumbar region: Secondary | ICD-10-CM | POA: Diagnosis not present

## 2017-02-10 DIAGNOSIS — M1811 Unilateral primary osteoarthritis of first carpometacarpal joint, right hand: Secondary | ICD-10-CM | POA: Diagnosis not present

## 2017-02-10 DIAGNOSIS — M19132 Post-traumatic osteoarthritis, left wrist: Secondary | ICD-10-CM | POA: Diagnosis not present

## 2017-02-13 ENCOUNTER — Other Ambulatory Visit: Payer: Self-pay

## 2017-02-13 MED ORDER — LEVOTHYROXINE SODIUM 75 MCG PO TABS: 75.0000 ug | ORAL_TABLET | Freq: Every day | ORAL | 3 refills | Status: DC

## 2017-02-14 DIAGNOSIS — F3181 Bipolar II disorder: Secondary | ICD-10-CM | POA: Diagnosis not present

## 2017-02-14 DIAGNOSIS — F319 Bipolar disorder, unspecified: Secondary | ICD-10-CM | POA: Diagnosis not present

## 2017-03-09 ENCOUNTER — Ambulatory Visit: Payer: PPO | Admitting: Endocrinology

## 2017-03-21 DIAGNOSIS — F319 Bipolar disorder, unspecified: Secondary | ICD-10-CM | POA: Diagnosis not present

## 2017-04-07 ENCOUNTER — Other Ambulatory Visit: Payer: Self-pay | Admitting: Physician Assistant

## 2017-04-10 ENCOUNTER — Other Ambulatory Visit: Payer: Self-pay | Admitting: Family Medicine

## 2017-04-10 DIAGNOSIS — Z1231 Encounter for screening mammogram for malignant neoplasm of breast: Secondary | ICD-10-CM

## 2017-04-18 ENCOUNTER — Ambulatory Visit: Payer: PPO | Admitting: Family Medicine

## 2017-04-18 ENCOUNTER — Telehealth: Payer: Self-pay | Admitting: Family Medicine

## 2017-04-18 ENCOUNTER — Ambulatory Visit: Payer: PPO

## 2017-04-18 DIAGNOSIS — K760 Fatty (change of) liver, not elsewhere classified: Secondary | ICD-10-CM | POA: Insufficient documentation

## 2017-04-18 NOTE — Telephone Encounter (Signed)
  Pt had a f/u OV sched w/ me on  which she cancelled/no-showed. Below were the notes I had made prior to her appt in order to expedite her visit.   I last saw her 6 mos piror  HTN: On amlodipine 10mg . + proteinuria on last sev dips prior Lab Results  Component Value Date   CREATININE 0.74 01/29/2017   CREATININE 0.64 10/28/2016   CREATININE 0.79 07/05/2016  Cyst of right kidney - 2.7 cm right kidney cyst seen on imaging 2014 but was c/o right flank pain with new persistent proteinuria (fortunately hematuria had resolved) so repeat US 11/2016 showed right kidney still with 2.6 cm simple cyst and otherwise nml.  HLD: Last lipid panel 6 mos prior, which was 4 mos after starting pravastatin 20mg , but still uncontrolled with LDL 174 and non-HDL 192 and was UNCHANGED from prior to starting statin.  Was dramatically better when pt was taking wellchol but had never tried statin so started pravastatin 07/2016 as ASCVD risk score was 7.7%  GERD: Ran out of rx omeprazole 40mg  6 wks prior.  Bipolar mood d/o: Seeing psychiatry Comer Locket at [facility]. On xanax 1mg  qhs prn, wellbutrin XL 300 qd, lamictal 200mg  bid, trileptal 150mg  qd, and trazodone 300mg  qhs. Does drink some EtOH (h/o EtOH abuse??) I rx pt hydroxyzine 50mg  qid prn anxiety or itching.  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 Repeat US 11/29/16 showed liver changes consistent with fatty infiltration and/or hepatocellular disease.  Lumbar spinal stenosis: On chronic hydrocodone?? B CTS: started B wrist splints 11/3016 - pt is left-handed w/ Lt>Rt carpal tunnel sxs  Hypothyroid s/p left thyroid lobectomy 06/2016 for papillary thyroid cancer: Followed by endocrine Dr. Loanne Drilling (Last OV 09/09/2016) who initially started pt on levothyroxine 75 mcg which we decreased to 50 mcg 10/2016 when she was endorsing  worsening irritability and manic episodes (as well as hair loss, depression, fatigue). TSH has not been rechecked since dose was lowered and pt does not have appt with endocrine scheduled though she was rec to f/u last mo.  F/u for papillary thyroid carcinoma rec by periodic physical exam. Lab Results  Component Value Date   TSH 2.110 10/28/2016   TSH 0.85 09/09/2016   TSH 1.73 02/25/2016   TSH 3.010 05/24/2013   TSH 1.328 12/13/2010   Left trochanteric bursitis: s/p cortisone injected 11/2016 by myself.  Vit D def: s/p high dose replacement?? Did she ever start taking? Lab Results  Component Value Date   VD25OH 18.5 (L) 10/28/2016   VD25OH 14 (L) 05/19/2015   VD25OH 15 (L) 02/21/2014   H/o OSA (diagnosed 1990s) but intolerant of CPAP.   Scheduled to have mammogram done this afternoon.   Physical exam:  ? Heart murmur?  Assessment/Plan: F/u with endocrine Tsh, vit D??, ua, micro, cmp, lipid??? + proteinuria on last sev dips prior Needs to increase statin -repeat lipid panel today if fasting. - d/c pravastatin 20 as no change in lipid panel at all in 4 mos and start atorvastatin 40. elev glucose - a1c

## 2017-04-18 NOTE — Progress Notes (Deleted)
   Subjective:    Patient ID: Becky Lawson, female    DOB: [birth year], 66 y.o.   MRN: [id number]  HPI  Becky Lawson is a delightful 66 yo woman who is here today for medication refills.  I last saw her 6 mos piror  HTN: On amlodipine 10mg . + proteinuria on last sev dips prior Lab Results  Component Value Date   CREATININE 0.74 01/29/2017   CREATININE 0.64 10/28/2016   CREATININE 0.79 07/05/2016  Cyst of right kidney - 2.7 cm right kidney cyst seen on imaging 2014 but was c/o right flank pain with new persistent proteinuria (fortunately hematuria had resolved) so repeat US 11/2016 showed right kidney still with 2.6 cm simple cyst and otherwise nml.  HLD: Last lipid panel 6 mos prior, which was 4 mos after starting pravastatin 20mg , but still uncontrolled with LDL 174 and non-HDL 192 and was UNCHANGED from prior to starting statin.  Was dramatically better when pt was taking wellchol but had never tried statin so started pravastatin 07/2016 as ASCVD risk score was 7.7%  GERD: Ran out of rx omeprazole 40mg  6 wks prior.  Bipolar mood d/o: Seeing psychiatry Comer Locket at [facility]. On xanax 1mg  qhs prn, wellbutrin XL 300 qd, lamictal 200mg  bid, trileptal 150mg  qd, and trazodone 300mg  qhs. Does drink some EtOH (h/o EtOH abuse??) I rx pt hydroxyzine 50mg  qid prn anxiety or itching.  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 Repeat US 11/29/16 showed liver changes consistent with fatty infiltration and/or hepatocellular disease.  Lumbar spinal stenosis: On chronic hydrocodone?? B CTS: started B wrist splints 11/3016 - pt is left-handed w/ Lt>Rt carpal tunnel sxs  Hypothyroid s/p left thyroid lobectomy 06/2016 for papillary thyroid cancer: Followed by endocrine Dr. Loanne Drilling (Last OV 09/09/2016) who initially started pt on levothyroxine 75 mcg which we decreased to 50 mcg  10/2016 when she was endorsing worsening irritability and manic episodes (as well as hair loss, depression, fatigue). TSH has not been rechecked since dose was lowered and pt does not have appt with endocrine scheduled though she was rec to f/u last mo.  F/u for papillary thyroid carcinoma rec by periodic physical exam. Lab Results  Component Value Date   TSH 2.110 10/28/2016   TSH 0.85 09/09/2016   TSH 1.73 02/25/2016   TSH 3.010 05/24/2013   TSH 1.328 12/13/2010   Left trochanteric bursitis: s/p cortisone injected 11/2016 by myself.  Vit D def: s/p high dose replacement?? Did she ever start taking? Lab Results  Component Value Date   VD25OH 18.5 (L) 10/28/2016   VD25OH 14 (L) 05/19/2015   VD25OH 15 (L) 02/21/2014   H/o OSA (diagnosed 1990s) but intolerant of CPAP.   Scheduled to have mammogram done this afternoon.  Review of Systems     Objective:   Physical Exam   Murmur?    Assessment & Plan:  F/u with endocrine Tsh, vit D??, ua, micro, cmp, lipid??? + proteinuria on last sev dips prior Needs to increase statin -repeat lipid panel today if fasting. - d/c pravastatin 20 as no change in lipid panel at all in 4 mos and start atorvastatin 40. elev glucose - a1c

## 2017-05-02 ENCOUNTER — Other Ambulatory Visit: Payer: Self-pay | Admitting: Family Medicine

## 2017-05-02 ENCOUNTER — Ambulatory Visit
Admission: RE | Admit: 2017-05-02 | Discharge: 2017-05-02 | Disposition: A | Discharge status: Home / Self Care | Payer: PPO | Source: Ambulatory Visit | Attending: Family Medicine | Admitting: Family Medicine

## 2017-05-02 DIAGNOSIS — Z1231 Encounter for screening mammogram for malignant neoplasm of breast: Secondary | ICD-10-CM | POA: Diagnosis not present

## 2017-05-09 DIAGNOSIS — F319 Bipolar disorder, unspecified: Secondary | ICD-10-CM | POA: Diagnosis not present

## 2017-05-13 ENCOUNTER — Ambulatory Visit (INDEPENDENT_AMBULATORY_CARE_PROVIDER_SITE_OTHER): Payer: PPO | Admitting: Family Medicine

## 2017-05-13 ENCOUNTER — Encounter: Payer: Self-pay | Admitting: Family Medicine

## 2017-05-13 VITALS — BP 148/82 | HR 78 | Temp 98.2°F | Resp 16 | Ht 63.75 in | Wt 198.0 lb

## 2017-05-13 DIAGNOSIS — G4733 Obstructive sleep apnea (adult) (pediatric): Secondary | ICD-10-CM

## 2017-05-13 DIAGNOSIS — R809 Proteinuria, unspecified: Secondary | ICD-10-CM

## 2017-05-13 DIAGNOSIS — R7303 Prediabetes: Secondary | ICD-10-CM

## 2017-05-13 DIAGNOSIS — R251 Tremor, unspecified: Secondary | ICD-10-CM

## 2017-05-13 DIAGNOSIS — I10 Essential (primary) hypertension: Secondary | ICD-10-CM | POA: Diagnosis not present

## 2017-05-13 DIAGNOSIS — E890 Postprocedural hypothyroidism: Secondary | ICD-10-CM

## 2017-05-13 DIAGNOSIS — K219 Gastro-esophageal reflux disease without esophagitis: Secondary | ICD-10-CM

## 2017-05-13 DIAGNOSIS — K760 Fatty (change of) liver, not elsewhere classified: Secondary | ICD-10-CM | POA: Diagnosis not present

## 2017-05-13 DIAGNOSIS — E559 Vitamin D deficiency, unspecified: Secondary | ICD-10-CM

## 2017-05-13 DIAGNOSIS — E7800 Pure hypercholesterolemia, unspecified: Secondary | ICD-10-CM | POA: Diagnosis not present

## 2017-05-13 LAB — POCT URINALYSIS DIP (MANUAL ENTRY)
BILIRUBIN UA: NEGATIVE mg/dL
Bilirubin, UA: NEGATIVE
Blood, UA: NEGATIVE
Glucose, UA: NEGATIVE mg/dL
LEUKOCYTES UA: NEGATIVE
Nitrite, UA: NEGATIVE
PH UA: 6 (ref 5.0–8.0)
Urobilinogen, UA: 0.2 E.U./dL

## 2017-05-13 LAB — POC MICROSCOPIC URINALYSIS (UMFC)

## 2017-05-13 MED ORDER — DOCUSATE SODIUM 100 MG PO CAPS: 100.0000 mg | ORAL_CAPSULE | Freq: Two times a day (BID) | ORAL | 0 refills | Status: DC

## 2017-05-13 MED ORDER — AMLODIPINE-OLMESARTAN 10-40 MG PO TABS: 1.0000 | ORAL_TABLET | Freq: Every day | ORAL | 0 refills | Status: DC

## 2017-05-13 NOTE — Patient Instructions (Addendum)
We have changed your blood pressure medicine so come back in 2 months so we can make sure it is effective and you are not having any side effects - you will need a blood test but will NOT need to be fasting for this.     IF you received an x-ray today, you will receive an invoice from [facility]. Please contact [facility] at [phone] with questions or concerns regarding your invoice.   IF you received labwork today, you will receive an invoice from [place]. Please contact [facility] at [phone] with questions or concerns regarding your invoice.   Our billing staff will not be able to assist you with questions regarding bills from these companies.  You will be contacted with the lab results as soon as they are available. The fastest way to get your results is to activate your My Chart account. Instructions are located on the last page of this paperwork. If you have not heard from [place] regarding the results in 2 weeks, please contact this office.     Tremor A tremor is trembling or shaking that you cannot control. Most tremors affect the hands or arms. Tremors can also affect the head, vocal cords, face, and other parts of the body. There are many types of tremors. Common types include:  Essential tremor. These usually occur in people over the age of 36. It may run in families and can happen in otherwise healthy people.  Resting tremor. These occur when the muscles are at rest, such as when your hands are resting in your lap. People with Parkinson disease often have resting tremors.  Postural tremor. These occur when you try to hold a pose, such as keeping your hands outstretched.  Kinetic tremor. These occur during purposeful movement, such as trying to touch a finger to your nose.  Task-specific tremor. These may occur when you perform tasks such as handwriting, speaking, or standing.  Psychogenic tremor. These dramatically lessen or disappear when you are  distracted. They can happen in people of all ages.  Some types of tremors have no known cause. Tremors can also be a symptom of nervous system problems (neurological disorders) that may occur with aging. Some tremors go away with treatment while others do not. Follow these instructions at home: Watch your tremor for any changes. The following actions may help to lessen any discomfort you are feeling:  Take medicines only as directed by your health care provider.  Limit alcohol intake to no more than 1 drink per day for nonpregnant women and 2 drinks per day for men. One drink equals 12 oz of beer, 5 oz of wine, or 1 oz of hard liquor.  Do not use any tobacco products, including cigarettes, chewing tobacco, or electronic cigarettes. If you need help quitting, ask your health care provider.  Avoid extreme heat or cold.  Limit the amount of caffeine you consumeas directed by your health care provider.  Try to get 8 hours of sleep each night.  Find ways to manage your stress, such as meditation or yoga.  Keep all follow-up visits as directed by your health care provider. This is important.  Contact a health care provider if:  You start having a tremor after starting a new medicine.  You have tremor with other symptoms such as: ? Numbness. ? Tingling. ? Pain. ? Weakness.  Your tremor gets worse.  Your tremor interferes with your day-to-day life. This information is not intended to replace advice given to you by  your health care provider. Make sure you discuss any questions you have with your health care provider. Document Released: 08/12/2002 Document Revised: 04/24/2016 Document Reviewed: 02/17/2014 Elsevier Interactive Patient Education  [employer].

## 2017-05-13 NOTE — Progress Notes (Signed)
Subjective:  By signing my name below, I, Becky Lawson, attest that this documentation has been prepared under the direction and in the presence of Becky Cheadle, MD Electronically Signed: Ladene Lawson, ED Scribe 05/13/2017 at 9:21 AM.   Patient ID: Becky Lawson, female    DOB: 08/06/1951, 66 y.o.   MRN: [id number]  Chief Complaint  Patient presents with  . Thyroid Problem    CA  . Gastroesophageal Reflux    med refill    HPI Becky Lawson is a 66 y.o. female who presents to Primary Care at [facility] for follow-up. Pt is fasting at this visit.   Pt had a f/u OV sched w/ me on  which she cancelled/no-showed. Below were the notes I had made prior to her appt in order to expedite her visit.   I last saw her 6 mos piror  HTN: On amlodipine 10mg . + proteinuria on last sev dips prior. She does not regularly check her BP outside of the office but states her systolic has been averaging 165.       Lab Results  Component Value Date   CREATININE 0.74 01/29/2017   CREATININE 0.64 10/28/2016   CREATININE 0.79 07/05/2016  Cyst of right kidney - 2.7 cm right kidney cyst seen on imaging 2014 but was c/o right flank pain with new persistent proteinuria (fortunately hematuria had resolved) so repeat US 11/2016 showed right kidney still with 2.6 cm simple cyst and otherwise nml.  HLD: Last lipid panel 6 mos prior, which was 4 mos after starting pravastatin 20mg , but still uncontrolled with LDL 174 and non-HDL 192 and was UNCHANGED from prior to starting statin.  Was dramatically better when pt was taking wellchol but had never tried statin so started pravastatin 07/2016 as ASCVD risk score was 7.7%. Reports that she has been consuming a lot of [employer] sodas; states she can not drink diet sodas due to the taste.   GERD: Ran out of rx omeprazole 40mg  6 wks prior. She has noticed epigastric burning radiating sternally. She only reprots GERD symptoms during the day. Pt purchased OTC omeprazole but states  they medication was coated with mint which upsets her stomach.   Bipolar mood d/o: Seeing psychiatry Comer Locket at [facility]. On xanax 1mg  qhs prn, wellbutrin XL 300 qd, lamictal 200mg  bid, trileptal 150mg  qd, and trazodone 300mg  qhs. Does drink some EtOH (h/o EtOH abuse??) I rx pt hydroxyzine 50mg  qid prn anxiety or itching which she is still using. Has noticed constipated which she treats with stool softeners without laxatives; suspects she is taking 100. Also takes Miralax occasionally.   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 Repeat US 11/29/16 showed liver changes consistent with fatty infiltration and/or hepatocellular disease.  Lumbar spinal stenosis: On chronic hydrocodone 3 daily; followed by Dr. [name].  B CTS: started B wrist splints 11/3016 - pt is left-handed w/ Lt>Rt carpal tunnel sxs  Hypothyroid s/p left thyroid lobectomy 06/2016 for papillary thyroid cancer: Followed by endocrine Dr. Loanne Drilling (Last OV 09/09/2016) who initially started pt on levothyroxine 75 mcg which we decreased to 50 mcg 10/2016 when she was endorsing worsening irritability and manic episodes (as well as hair loss, depression, fatigue). TSH has not been rechecked since dose was lowered and pt does not have appt with endocrine scheduled though she was rec to f/u  last mo.  F/u for papillary thyroid carcinoma rec by periodic physical exam. Has recently noticed fatigue, hair loss, and seem as if "her tongue is thicker on the left side". Denies hematuria or changes in urine. Pt has not seen Dr. Loanne Drilling in several months.       Lab Results  Component Value Date   TSH 2.110 10/28/2016   TSH 0.85 09/09/2016   TSH 1.73 02/25/2016   TSH 3.010 05/24/2013   TSH 1.328 12/13/2010   Left trochanteric bursitis: s/p cortisone injected 11/2016 by myself.  Vit D def: s/p high dose  replacement?? Taking Vitamin D once weekly.       Lab Results  Component Value Date   VD25OH 18.5 (L) 10/28/2016   VD25OH 14 (L) 05/19/2015   VD25OH 15 (L) 02/21/2014   H/o OSA (diagnosed 1990s) but intolerant of CPAP.   Scheduled to have mammogram done this afternoon.  Tremors: Recently pt has noticed tremors in both hands with writing and typing.   Past Medical History:  Diagnosis Date  . Anxiety   . Arthritis   . Bipolar 1 disorder ([place])   . Chronic kidney disease   . Depression   . GERD (gastroesophageal reflux disease)   . Heart murmur   . Hepatitis C    resolved completely after treatment in 2014, genotype 1b, [facility] at [facility]  . Hyperlipidemia   . Hypertension   . Jaundice 11/01/2012  . Pruritic disorder 02/13/2013  . Sleep apnea    Was diagnosed approximately 20 years ago, but does not wear CPAP patient stated "I gave it back.Becky KitchenMarland KitchenI couldn't wear that thing it was horrible"  . Spinal stenosis    Current Outpatient Prescriptions on File Prior to Visit  Medication Sig Dispense Refill  . ALPRAZolam (XANAX) 1 MG tablet Take 1 mg by mouth at bedtime as needed for anxiety.    Becky Lawson amLODipine (NORVASC) 10 MG tablet Take 1 tablet (10 mg total) by mouth daily. 90 tablet 3  . HYDROcodone-acetaminophen (NORCO) 10-325 MG tablet Take 1 tablet by mouth 3 (three) times daily.     . hydrOXYzine (ATARAX/VISTARIL) 25 MG tablet Take 2 tablets (50 mg total) by mouth every 6 (six) hours as needed for anxiety or itching. 240 tablet 0  . lamoTRIgine (LAMICTAL) 200 MG tablet Take 200 mg by mouth 2 (two) times daily.    Becky Lawson levothyroxine (SYNTHROID, LEVOTHROID) 75 MCG tablet Take 1 tablet (75 mcg total) by mouth daily before breakfast. 90 tablet 3  . omeprazole (PRILOSEC) 40 MG capsule TAKE 1 CAPSULE BY MOUTH EVERY DAY 90 capsule 0  . pravastatin (PRAVACHOL) 20 MG tablet Take 1 tablet (20 mg total) by mouth daily. 90 tablet 3  . traZODone (DESYREL) 100 MG tablet Take 300 mg by mouth at bedtime.      . Vitamin D, Ergocalciferol, (DRISDOL) 50000 units CAPS capsule Take 50,000 Units by mouth every 7 (seven) days.     Current Facility-Administered Medications on File Prior to Visit  Medication Dose Route Frequency Provider Last Rate Last Dose  . methylPREDNISolone acetate (DEPO-MEDROL) injection 40 mg  40 mg Intramuscular Once Shawnee Knapp, MD       Allergies  Allergen Reactions  . No Known Allergies    Past Surgical History:  Procedure Laterality Date  . ABDOMINAL HYSTERECTOMY    . COLONOSCOPY W/ POLYPECTOMY    . JOINT REPLACEMENT Bilateral    knee  . KNEE ARTHROSCOPY Bilateral   . PARTIAL HYSTERECTOMY    .  THYROID LOBECTOMY Left 06/16/2016  . THYROID LOBECTOMY Left 06/16/2016   Procedure: LEFT THYROID LOBECTOMY;  Surgeon: Greer Pickerel, MD;  Location: [facility];  Service: General;  Laterality: Left;   Family History  Problem Relation Age of Onset  . Diabetes Mother   . Heart disease Mother   . Hyperlipidemia Mother   . Hypertension Mother   . Stroke Mother   . Hypertension Father   . Parkinson's disease Father   . Heart disease Father   . Hyperlipidemia Father   . Hemachromatosis Daughter   . Thyroid disease Neg Hx    Social History   Social History  . Marital status: Divorced    Spouse name: N/A  . Number of children: N/A  . Years of education: N/A   Occupational History  . Personal Caregiver    Social History Main Topics  . Smoking status: Former Smoker    Packs/day: 1.00    Years: 15.00    Quit date: 06/30/1979  . Smokeless tobacco: Never Used  . Alcohol use 0.6 oz/week    1 Standard drinks or equivalent per week     Comment: 1 glass of red wine daily  . Drug use: No  . Sexual activity: Yes    Birth control/ protection: None   Other Topics Concern  . None   Social History Narrative   Divorced. Education: [employer]. Exercise: No.   Depression screen [facility] 2/9 05/13/2017 12/19/2016 11/22/2016 10/28/2016 07/05/2016  Decreased Interest 3 0 0 0 0  Down,  Depressed, Hopeless 3 1 1  0 0  PHQ - 2 Score 6 1 1  0 0  Altered sleeping 0 - - - -  Tired, decreased energy 3 - - - -  Change in appetite 3 - - - -  Feeling bad or failure about yourself  3 - - - -  Trouble concentrating 3 - - - -  Moving slowly or fidgety/restless 3 - - - -  Suicidal thoughts 0 - - - -  PHQ-9 Score 21 - - - -  Difficult doing work/chores Somewhat difficult - - - -    Review of Systems  Constitutional: Positive for fatigue.  Gastrointestinal: Positive for constipation.  Genitourinary: Negative for hematuria.  Neurological: Positive for tremors.      Objective:   Physical Exam  Constitutional: She is oriented to person, place, and time. She appears well-developed and well-nourished. No distress.  HENT:  Head: Normocephalic and atraumatic.  Right Ear: A middle ear effusion (moderate) is present.  Left Ear: A middle ear effusion (moderate) is present.  R canal is irritated  Nares with erythema and narrowed Tonsillar papillae with L greater than R erythema and edema  Eyes: Conjunctivae and EOM are normal.  Neck: Neck supple. No tracheal deviation present. No thyroid mass present.  Cardiovascular: Normal rate and regular rhythm.   Murmur heard.  Systolic (injection; L upper sternal border) murmur is present with a grade of 2/6  Pulmonary/Chest: Effort normal and breath sounds normal. No respiratory distress.  Lungs clear. Good air movement.  Abdominal: Soft. Bowel sounds are normal.  Musculoskeletal: Normal range of motion.  Lymphadenopathy:    She has no cervical adenopathy.  Neurological: She is alert and oriented to person, place, and time.  Skin: Skin is warm and dry.  Psychiatric: She has a normal mood and affect. Her behavior is normal.  Nursing note and vitals reviewed.  BP (!) 148/82   Pulse 78   Temp 98.2 F (36.8 C) (  Oral)   Resp 16   Ht 5' 3.75" (1.619 m)   Wt 198 lb (89.8 kg)   SpO2 97%   BMI 34.25 kg/m     Assessment & Plan:   1.  Essential hypertension   2. Post-surgical hypothyroidism   3. Vitamin D deficiency   4. Pure hypercholesterolemia   5. Fatty liver   6. Gastroesophageal reflux disease, esophagitis presence not specified   7. Prediabetes   8. Proteinuria, unspecified type   9. Tremor   10. OSA (obstructive sleep apnea)     Orders Placed This Encounter  Procedures  . CBC with Differential/Platelet  . Comprehensive metabolic panel    Order Specific Question:   Has the patient fasted?    Answer:   Yes  . VITAMIN D 25 Hydroxy (Vit-D Deficiency, Fractures)  . TSH  . Lipid panel    Order Specific Question:   Has the patient fasted?    Answer:   Yes  . Hemoglobin A1c  . CBC with Differential/Platelet  . Ambulatory referral to Sleep Studies    Referral Priority:   Routine    Referral Type:   Consultation    Referral Reason:   Specialty Services Required    Number of Visits Requested:   1  . Ambulatory referral to Neurology    Referral Priority:   Routine    Referral Type:   Consultation    Referral Reason:   Specialty Services Required    Requested Specialty:   Neurology    Number of Visits Requested:   1  . POCT urinalysis dipstick  . POCT Microscopic Urinalysis ([facility])    Meds ordered this encounter  Medications  . perphenazine (TRILAFON) 4 MG tablet    Sig: TAKE 1 TABLET BY MOUTH TWICE A DAY FOR 3 DAYS THEN 2 TABLETS DAILY TWICE DAILY    Refill:  0  . buPROPion (WELLBUTRIN XL) 300 MG 24 hr tablet    Sig: Take 300 mg by mouth daily.    Refill:  0  . magnesium oxide (MAG-OX) 400 MG tablet    Sig: Take 400 mg by mouth daily.  . Omega-3 Fatty Acids (FISH OIL) 1200 MG CAPS    Sig: Take by mouth.  . vitamin E 600 UNIT capsule    Sig: Take 600 Units by mouth daily.  Becky Lawson docusate sodium (COLACE) 100 MG capsule    Sig: Take 1 capsule (100 mg total) by mouth 2 (two) times daily.    Dispense:  10 capsule    Refill:  0  . amLODipine-olmesartan (AZOR) 10-40 MG tablet    Sig: Take 1 tablet by  mouth daily.    Dispense:  90 tablet    Refill:  0    I personally performed the services described in this documentation, which was scribed in my presence. The recorded information has been reviewed and considered, and addended by me as needed.   Becky Lawson, M.D.  Primary Care at [facility] [address] [place], [place] [address] [phone] phone [phone] fax  05/15/17 11:36 PM

## 2017-05-15 LAB — CBC WITH DIFFERENTIAL/PLATELET
BASOS ABS: 0 10*3/uL (ref 0.0–0.2)
Basos: 0 %
EOS (ABSOLUTE): 0 10*3/uL (ref 0.0–0.4)
Eos: 1 %
HEMOGLOBIN: 14.2 g/dL (ref 11.1–15.9)
Hematocrit: 43.6 % (ref 34.0–46.6)
Immature Grans (Abs): 0 10*3/uL (ref 0.0–0.1)
Immature Granulocytes: 0 %
LYMPHS ABS: 1.2 10*3/uL (ref 0.7–3.1)
Lymphs: 23 %
MCH: 30.9 pg (ref 26.6–33.0)
MCHC: 32.6 g/dL (ref 31.5–35.7)
MCV: 95 fL (ref 79–97)
Monocytes Absolute: 0.3 10*3/uL (ref 0.1–0.9)
Monocytes: 7 %
NEUTROS ABS: 3.5 10*3/uL (ref 1.4–7.0)
Neutrophils: 69 %
PLATELETS: 200 10*3/uL (ref 150–379)
RBC: 4.6 x10E6/uL (ref 3.77–5.28)
RDW: 13.8 % (ref 12.3–15.4)
WBC: 5 10*3/uL (ref 3.4–10.8)

## 2017-05-15 LAB — COMPREHENSIVE METABOLIC PANEL
A/G RATIO: 2.5 — AB (ref 1.2–2.2)
ALBUMIN: 4.8 g/dL (ref 3.6–4.8)
ALT: 13 IU/L (ref 0–32)
AST: 23 IU/L (ref 0–40)
Alkaline Phosphatase: 69 IU/L (ref 39–117)
BILIRUBIN TOTAL: 0.3 mg/dL (ref 0.0–1.2)
BUN/Creatinine Ratio: 17 (ref 12–28)
BUN: 14 mg/dL (ref 8–27)
CALCIUM: 9.4 mg/dL (ref 8.7–10.3)
CO2: 24 mmol/L (ref 20–29)
Chloride: 100 mmol/L (ref 96–106)
Creatinine, Ser: 0.84 mg/dL (ref 0.57–1.00)
GFR, EST AFRICAN AMERICAN: 84 mL/min/{1.73_m2} (ref >59)
GFR, EST NON AFRICAN AMERICAN: 73 mL/min/{1.73_m2} (ref >59)
GLUCOSE: 96 mg/dL (ref 65–99)
Globulin, Total: 1.9 g/dL (ref 1.5–4.5)
POTASSIUM: 5 mmol/L (ref 3.5–5.2)
SODIUM: 141 mmol/L (ref 134–144)
TOTAL PROTEIN: 6.7 g/dL (ref 6.0–8.5)

## 2017-05-15 LAB — VITAMIN D 25 HYDROXY (VIT D DEFICIENCY, FRACTURES): Vit D, 25-Hydroxy: 22.7 ng/mL — ABNORMAL LOW (ref 30.0–100.0)

## 2017-05-15 LAB — LIPID PANEL
Chol/HDL Ratio: 3.8 ratio (ref 0.0–4.4)
Cholesterol, Total: 231 mg/dL — ABNORMAL HIGH (ref 100–199)
HDL: 61 mg/dL (ref >39)
LDL CALC: 154 mg/dL — AB (ref 0–99)
Triglycerides: 81 mg/dL (ref 0–149)
VLDL CHOLESTEROL CAL: 16 mg/dL (ref 5–40)

## 2017-05-15 LAB — TSH: TSH: 3.89 u[IU]/mL (ref 0.450–4.500)

## 2017-05-15 LAB — HEMOGLOBIN A1C
ESTIMATED AVERAGE GLUCOSE: 91 mg/dL
HEMOGLOBIN A1C: 4.8 % (ref 4.8–5.6)

## 2017-05-16 ENCOUNTER — Other Ambulatory Visit: Payer: Self-pay | Admitting: Physician Assistant

## 2017-05-29 ENCOUNTER — Encounter: Payer: Self-pay | Admitting: Family Medicine

## 2017-06-05 ENCOUNTER — Telehealth: Payer: Self-pay

## 2017-06-05 NOTE — Telephone Encounter (Signed)
Called pt to schedule Medicare Annual Wellness Visit. -nr  

## 2017-06-27 ENCOUNTER — Institutional Professional Consult (permissible substitution): Payer: PPO | Admitting: Neurology

## 2017-07-05 ENCOUNTER — Ambulatory Visit: Payer: PPO | Admitting: Neurology

## 2017-07-05 NOTE — Telephone Encounter (Signed)
Additional telephone outreach to schedule AWV with nurse health advisor.    Becky Lawson, B.A.  Care Guide - Primary Care at [place]

## 2017-07-12 ENCOUNTER — Other Ambulatory Visit: Payer: Self-pay | Admitting: *Deleted

## 2017-07-12 NOTE — Patient Outreach (Addendum)
[facility]) Care Management  07/12/2017  Bernece Gall 07-22-1951 [id number]   Telephone Screen  Referral Date: 07/11/17 Referral Source: HTA Referral Reason: Latuda Medication Insurance:HTA  Outreach attempt #1 to patient. No answer. RN CM left HIPAA compliant message along with contact info.    Plan: RN CM will contact patient within one week.   Lake Bells, RN, BSN, MHA/MSL, [place] Telephonic Care Manager Coordinator [facility] Direct Phone: [phone] Toll Free: [phone] Fax: [phone]

## 2017-07-13 ENCOUNTER — Other Ambulatory Visit: Payer: Self-pay | Admitting: *Deleted

## 2017-07-13 ENCOUNTER — Ambulatory Visit: Payer: PPO | Admitting: Family Medicine

## 2017-07-13 NOTE — Patient Outreach (Signed)
[facility]) Care Management  07/13/2017  [name] 1950/10/07 [id number]  Telephone Screen  Referral Date: 07/11/17 Referral Source: HTA Referral Reason: Latuda Medication Insurance:HTA  Outreach telephone call to patient. HIPAA verified with patient. Patient is requesting assistance with Latuda. She was receiving samples of Latuda from the doctor until the supplies were depleted. Patient stated, she has tried every medicine possible to treat her bipolar, which hasn't helped. She stated, Becky Lawson is the only medication that can controls her symptoms and give her "meaning to life". Patient stated, she is completely out of the medication. She is trying to avoid being off of the medication for a significant length of time. [facility] services and benefits explained to patient. Patient agreed to services.  Plan: RN CM will send [place] referral for inability to afford medication. RN CM advised patient to contact RN CM for any needs or concerns.   Lake Bells, RN, BSN, MHA/MSL, [place] Telephonic Care Manager Coordinator [facility] Direct Phone: [phone] Toll Free: [phone] Fax: [phone]

## 2017-07-17 ENCOUNTER — Telehealth: Payer: Self-pay | Admitting: Pharmacist

## 2017-07-17 NOTE — Telephone Encounter (Signed)
-----   Message from Jiles Harold sent at 07/14/2017  9:33 AM EST ----- Regarding: Referral: Order for [name]  Referral from Lake Bells, RN  "Please see below request"  Forde Radon ----- Message ----- From: Gabriel Cirri, RN Sent: 07/13/2017   9:18 PM To: Thn Cm Communication Orders Subject: Order for Wittwer,Deyani                            Patient Name: Becky Lawson,YTKZS([id number]) Sex: Female DOB: 11-19-1950    PCP: [facility], [place]: [facility]   Types of orders made on 07/13/2017: Nursing  Order Date:07/13/2017 Ordering User:FUTRELL, Ree Kida [[id number]] Encounter Provider:Futrell, Ree Kida, RN [phone] Authorizing Provide r: Shawnee Knapp, MD [phone] Department:[facility][[id number]]  Order Specific Information Order: Comm to Pharmacy [Custom: [id number]]  Order #: [id number] Qty: 1   Priority: Routine  Class: Clinic Performed   Comment:Referral from: HTA            Medication assistance. Patient unable to afford meds [name]).                        Lake Bells, RN, BSN, MHA/MSL, [facility]            [facility] Telephonic C are Geologist, engineering Phone: [phone]            Toll Free: [phone]            Fax: [phone]     Priority: Routine  Class: Clinic Performed   Comment:Referral from: HTA            Medication assistance. Patient unable to afford meds [name]).                        Lake Bells, RN, BSN, MHA/MSL, [place]            Direct Phone: [phone]            Toll Free: [phone]            Fax: [phone]

## 2017-07-18 ENCOUNTER — Encounter: Payer: Self-pay | Admitting: Pharmacist

## 2017-07-18 ENCOUNTER — Ambulatory Visit: Payer: Self-pay | Admitting: *Deleted

## 2017-07-20 ENCOUNTER — Encounter: Payer: Self-pay | Admitting: Pharmacist

## 2017-07-20 NOTE — Patient Outreach (Signed)
Late Entry for 07/17/17  [facility]) Care Management  [place]   07/20/2017  Becky Lawson September 08, 1950 [id number]  Subjective: Patient was called regarding medication assistance. HIPAA identifiers were obtained. Patient is a 66 year old female with multiple medical conditions including but not limited to:  Bipolar disorder, hypertension, GERD, spinal stenosis and vitamin D deficiency.  Patient manages her medications on her own.  She expressed concerns over the price of Latuda.  Objective:   Encounter Medications: Outpatient Encounter Medications as of 07/17/2017  Medication Sig  . ALPRAZolam (XANAX) 1 MG tablet Take 1 mg by mouth at bedtime as needed for anxiety.  [name] amLODipine-olmesartan (AZOR) 10-40 MG tablet Take 1 tablet by mouth daily.  [name] buPROPion (WELLBUTRIN XL) 300 MG 24 hr tablet Take 300 mg by mouth daily.  . Coenzyme Q10 (CO Q-10) 300 MG CAPS Take 1 capsule daily by mouth.  . docusate sodium (COLACE) 100 MG capsule Take 1 capsule (100 mg total) by mouth 2 (two) times daily.  . Ginkgo Biloba 40 MG TABS Take 1 tablet daily by mouth.  [name] HYDROcodone-acetaminophen (NORCO) 10-325 MG tablet Take 1 tablet by mouth 3 (three) times daily.   . hydrOXYzine (ATARAX/VISTARIL) 25 MG tablet Take 2 tablets (50 mg total) by mouth every 6 (six) hours as needed for anxiety or itching.  . lamoTRIgine (LAMICTAL) 200 MG tablet Take 200 mg by mouth 2 (two) times daily.  [name] LATUDA 40 MG TABS tablet Take 40 mg daily by mouth.  . levothyroxine (SYNTHROID, LEVOTHROID) 75 MCG tablet Take 1 tablet (75 mcg total) by mouth daily before breakfast.  . magnesium oxide (MAG-OX) 400 MG tablet Take 400 mg by mouth daily.  . Omega-3 Fatty Acids (FISH OIL) 1200 MG CAPS Take by mouth.  [name] omeprazole (PRILOSEC) 40 MG capsule TAKE 1 CAPSULE BY MOUTH EVERY DAY  . pravastatin (PRAVACHOL) 20 MG tablet Take 1 tablet (20 mg total) by mouth daily.  . traZODone (DESYREL) 100 MG tablet Take 300 mg  by mouth at bedtime.   . Vitamin D, Ergocalciferol, (DRISDOL) 50000 units CAPS capsule Take 50,000 Units by mouth every 7 (seven) days.  . vitamin E 600 UNIT capsule Take 600 Units by mouth daily.  [name] amLODipine (NORVASC) 10 MG tablet Take 1 tablet (10 mg total) by mouth daily. (Patient not taking: Reported on 07/17/2017)  . perphenazine (TRILAFON) 4 MG tablet TAKE 1 TABLET BY MOUTH TWICE A DAY FOR 3 DAYS THEN 2 TABLETS DAILY TWICE DAILY   Facility-Administered Encounter Medications as of 07/17/2017  Medication  . methylPREDNISolone acetate (DEPO-MEDROL) injection 40 mg    Functional Status: No flowsheet data found.  Fall/Depression Screening: Fall Risk  07/13/2017 05/13/2017 11/22/2016  Falls in the past year? Yes No Yes  Number falls in past yr: 1 - 1  Injury with Fall? No - Yes  Comment - - left hand  Follow up Falls evaluation completed - -   PHQ 2/9 Scores 07/13/2017 05/13/2017 12/19/2016 11/22/2016 10/28/2016 07/05/2016 07/02/2016  PHQ - 2 Score 6 6 1 1  0 0 0  PHQ- 9 Score 18 21 - - - - -      Assessment: The patient's medications were reviewed via telephone.     Drugs sorted by system:  Neurologic/Psychologic: Alprazolam Bupropion Hydroxyzine Lamotrigine Latuda Perphenazine Trazodone   Cardiovascular: Amlodipine/Olmesartan Omega 3 Fatty Acids Pravastatin  Endocrine: Levothyroxine  Pain: Hydrocodone/Acetaminophen  Vitamins/Minerals: CoEnzyme Q10 Ergocalciferol (Vitamin D) Ginkgo Biloba Vitamin E   Medication Assistance  Findings:  -patient is over income for the "Extra Help" program offered by Social Security -patient appears to meet the financial requirements for   [employer]'s patient assistance -[place] was called and the representative confirmed they do not allow patient's with Medicare Part D to be enrolled in their program  Patient was encouraged to call her provider back and ask for samples.   Plan: Send an email to [employer] at [facility] to inquire about the patient's options. Call patient back to let her know the status.   Elayne Guerin, PharmD, [place] Clinical Pharmacist [phone]

## 2017-07-24 ENCOUNTER — Encounter: Payer: Self-pay | Admitting: Family Medicine

## 2017-07-24 ENCOUNTER — Other Ambulatory Visit: Payer: Self-pay | Admitting: Pharmacist

## 2017-07-24 ENCOUNTER — Other Ambulatory Visit: Payer: Self-pay

## 2017-07-24 ENCOUNTER — Ambulatory Visit (INDEPENDENT_AMBULATORY_CARE_PROVIDER_SITE_OTHER): Payer: PPO | Admitting: Family Medicine

## 2017-07-24 VITALS — BP 128/56 | HR 82 | Temp 98.0°F | Resp 16 | Ht 63.75 in | Wt 197.0 lb

## 2017-07-24 DIAGNOSIS — Z5181 Encounter for therapeutic drug level monitoring: Secondary | ICD-10-CM | POA: Diagnosis not present

## 2017-07-24 DIAGNOSIS — I10 Essential (primary) hypertension: Secondary | ICD-10-CM

## 2017-07-24 DIAGNOSIS — E890 Postprocedural hypothyroidism: Secondary | ICD-10-CM | POA: Diagnosis not present

## 2017-07-24 DIAGNOSIS — E7800 Pure hypercholesterolemia, unspecified: Secondary | ICD-10-CM | POA: Diagnosis not present

## 2017-07-24 DIAGNOSIS — R531 Weakness: Secondary | ICD-10-CM

## 2017-07-24 DIAGNOSIS — E559 Vitamin D deficiency, unspecified: Secondary | ICD-10-CM

## 2017-07-24 DIAGNOSIS — F319 Bipolar disorder, unspecified: Secondary | ICD-10-CM | POA: Diagnosis not present

## 2017-07-24 DIAGNOSIS — N281 Cyst of kidney, acquired: Secondary | ICD-10-CM | POA: Diagnosis not present

## 2017-07-24 DIAGNOSIS — R5382 Chronic fatigue, unspecified: Secondary | ICD-10-CM | POA: Diagnosis not present

## 2017-07-24 MED ORDER — PRAVASTATIN SODIUM 40 MG PO TABS: 40.0000 mg | ORAL_TABLET | Freq: Every day | ORAL | 3 refills | Status: DC

## 2017-07-24 MED ORDER — VITAMIN D-3 125 MCG (5000 UT) PO TABS: 5000.0000 [IU] | ORAL_TABLET | Freq: Every day | ORAL | Status: DC

## 2017-07-24 MED ORDER — LEVOTHYROXINE SODIUM 100 MCG PO TABS: 100.0000 ug | ORAL_TABLET | Freq: Every day | ORAL | 0 refills | Status: DC

## 2017-07-24 MED ORDER — VITAMIN D (ERGOCALCIFEROL) 1.25 MG (50000 UNIT) PO CAPS: 50000.0000 [IU] | ORAL_CAPSULE | ORAL | 0 refills | Status: DC

## 2017-07-24 NOTE — Patient Instructions (Addendum)
Increase your pravastatin - take 2 tabs of your current pravastatin 20mg  until you run out - then start the new pravastatin 40mg  that has been sent to your pharmacy.  If your muscle weakness/muscle fatigue is getting worse, try stopping your pravastatin.   Start taking vitamin D 5000u daily in addition to the high dose once weekly vitamin D.   IF you received an x-ray today, you will receive an invoice from [facility]. Please contact [facility] at [phone] with questions or concerns regarding your invoice.   IF you received labwork today, you will receive an invoice from [facility]. Please contact [facility] at [phone] with questions or concerns regarding your invoice.   Our billing staff will not be able to assist you with questions regarding bills from these companies.  You will be contacted with the lab results as soon as they are available. The fastest way to get your results is to activate your My Chart account. Instructions are located on the last page of this paperwork. If you have not heard from [place] regarding the results in 2 weeks, please contact this office.     Your headaches might be from cutting out the soda.  Hopefully they will continue to go away. Or you can try drinking another 1/2 cup of coffee when you have one to see if that helps.  General Headache Without Cause A headache is pain or discomfort felt around the head or neck area. There are many causes and types of headaches. In some cases, the cause may not be found. Follow these instructions at home: Managing pain  Take over-the-counter and prescription medicines only as told by your doctor.  Lie down in a dark, quiet room when you have a headache.  If directed, apply ice to the head and neck area: ? Put ice in a plastic bag. ? Place a towel between your skin and the bag. ? Leave the ice on for 20 minutes, 2-3 times per day.  Use a heating pad or hot shower to apply heat to the head and  neck area as told by your doctor.  Keep lights dim if bright lights bother you or make your headaches worse. Eating and drinking  Eat meals on a regular schedule.  Lessen how much alcohol you drink.  Lessen how much caffeine you drink, or stop drinking caffeine. General instructions  Keep all follow-up visits as told by your doctor. This is important.  Keep a journal to find out if certain things bring on headaches. For example, write down: ? What you eat and drink. ? How much sleep you get. ? Any change to your diet or medicines.  Relax by getting a massage or doing other relaxing activities.  Lessen stress.  Sit up straight. Do not tighten (tense) your muscles.  Do not use tobacco products. This includes cigarettes, chewing tobacco, or e-cigarettes. If you need help quitting, ask your doctor.  Exercise regularly as told by your doctor.  Get enough sleep. This often means 7-9 hours of sleep. Contact a doctor if:  Your symptoms are not helped by medicine.  You have a headache that feels different than the other headaches.  You feel sick to your stomach (nauseous) or you throw up (vomit).  You have a fever. Get help right away if:  Your headache becomes really bad.  You keep throwing up.  You have a stiff neck.  You have trouble seeing.  You have trouble speaking.  You have pain in the eye  or ear.  Your muscles are weak or you lose muscle control.  You lose your balance or have trouble walking.  You feel like you will pass out (faint) or you pass out.  You have confusion. This information is not intended to replace advice given to you by your health care provider. Make sure you discuss any questions you have with your health care provider. Document Released: 05/31/2008 Document Revised: 01/28/2016 Document Reviewed: 12/15/2014 Elsevier Interactive Patient Education  [employer].

## 2017-07-24 NOTE — Progress Notes (Signed)
Subjective:   Patient ID: Becky Lawson, female    DOB: 27-Jun-1951, 66 y.o.   MRN: [id number]  Chief Complaint  Patient presents with  . Follow-up    hypertension   HPI Becky Lawson is a 66 y.o. female who presents to Primary Care at [facility] for follow-up of her chronic medical conditions as detailed below including HTN as we newly started olmesartan at her last visit 2.5 mos prior  HTN: On amlodipine 10mg  with SBP still average 165 so added in olmesartan 40mg  at visit 2 mos ago. + proteinuria on last sev dips prior. She does not regularly check her BP outside of the office but states her systolic has been averaging 165. Reports that she has been cut down some on the Coca [employer] sodas; states she can not drink diet sodas due to the taste.   She has had more HAs over the past mo but she cut down on Coke from daily to just once a week. Still drinking about 1/2 cup of coffee daily. No vision changes. No dizziness/orthostatics sxs. Did see the eye dr a little over a year ago.    HLD: Lipid panel 11/2016 still uncontrolled with LDL 174 and non-HDL 192 and was UNCHANGED from prior to starting pravastatin 20mg  4 mos prior.  Rechecked 2 mos prior after 10 mos of statin mildly improved with LDL 154 and non-HDL 170.  LFTs still nml. Was dramatically better when pt was taking wellchol but had never tried statin so started pravastatin 07/2016 as ASCVD risk score was 7.7%.   GERD: Controlled on omeprazole. Pt purchased OTC omeprazole but states they medication was coated with mint which upsets her stomach.   Bipolar mood d/o: Seeing psychiatry Comer Locket at [facility]. On xanax 1mg  qhs prn, wellbutrin XL 300 qd, lamictal 200mg  bid, trileptal 150mg  qd, and trazodone 300mg  qhs. Does drink some EtOH (h/o EtOH abuse??) I rx pt hydroxyzine 50mg  qid prn anxiety or itching which she is still using. Has noticed constipated which she treats with stool softeners without laxatives; suspects she is taking 100.  Also takes Miralax occasionally.   Did great on Latuda but put into donut hole so retrying seroquel.  She has gotten a call from [facility] to help with this but to no avail.   Liver abnml - LFTs nml 2 mos ago. h/o "stage 2 scarring" on liver biosy but poss of fatty liver as well, imaging in 2014 notes liver enlarged but pt's hep C has been treated since then. Repeat US 11/29/16 showed liver changes consistent with fatty infiltration and/or hepatocellular disease.  Lumbar spinal stenosis: On chronic hydrocodone 3 daily; followed by Dr. [name].  B CTS: started B wrist splints 11/3016 - pt is left-handed w/ Lt>Rt carpal tunnel sxs  Hypothyroid s/p left thyroid lobectomy 06/2016 for papillary thyroid cancer: Initially followed by endocrine Dr. Loanne Drilling (Last OV 09/09/2016) who initially started pt on levothyroxine 75 mcg with repeat TSH 05/2017 nml but gradually increasing over this year. Pt was rec to f/u w/ endocrine in August (3 mos ago) but has not and would like her thyroid followed here.  F/u for papillary thyroid carcinoma rec by periodic physical exam.  Would like to repeat US after the new year to ensure the other lobe has not developed any malignant changes due to her worsening sxs. She has been noticing more hair loss and severe muscle fatigue without as well as diffuse generalized fatigue. Depression still severe.  Lab Results  Component Value  Date   TSH 3.890 05/13/2017   TSH 2.110 10/28/2016   TSH 0.85 09/09/2016   TSH 1.73 02/25/2016   TSH 3.010 05/24/2013     Left trochanteric bursitis: s/p cortisone injected 11/2016 by myself.  Vit D def: s/p high dose replacement. Taking Vitamin D once weekly but still to low.  Lab Results  Component Value Date   VD25OH 22.7 (L) 05/13/2017   VD25OH 18.5 (L) 10/28/2016   VD25OH 14 (L) 05/19/2015   VD25OH 15 (L) 02/21/2014    H/o OSA (diagnosed 1990s) but intolerant of CPAP. We referred her to [facility] but has to wait until after 09/2017 due to  finances.  Tremors: Recently pt has noticed tremors in both hands with writing and typing. Her father had parkinson's. She has had lots of falls. Will plan on waiting until after 09/2017 to see neurology due to finances.  Past Medical History:  Diagnosis Date  . Anxiety   . Arthritis   . Bipolar 1 disorder ([place])   . Chronic kidney disease   . Depression   . GERD (gastroesophageal reflux disease)   . Heart murmur   . Hepatitis C    resolved completely after treatment in 2014, genotype 1b, followed at [facility]  . Hyperlipidemia   . Hypertension   . Jaundice 11/01/2012  . Pruritic disorder 02/13/2013  . Sleep apnea    Was diagnosed approximately 20 years ago, but does not wear CPAP patient stated "I gave it back.Marland KitchenMarland KitchenI couldn't wear that thing it was horrible"  . Spinal stenosis    Current Outpatient Medications on File Prior to Visit  Medication Sig Dispense Refill  . ALPRAZolam (XANAX) 1 MG tablet Take 1 mg by mouth at bedtime as needed for anxiety.    [name] amLODipine (NORVASC) 10 MG tablet Take 1 tablet (10 mg total) by mouth daily. (Patient not taking: Reported on 07/17/2017) 90 tablet 3  . amLODipine-olmesartan (AZOR) 10-40 MG tablet Take 1 tablet by mouth daily. 90 tablet 0  . buPROPion (WELLBUTRIN XL) 300 MG 24 hr tablet Take 300 mg by mouth daily.  0  . Coenzyme Q10 (CO Q-10) 300 MG CAPS Take 1 capsule daily by mouth.    . docusate sodium (COLACE) 100 MG capsule Take 1 capsule (100 mg total) by mouth 2 (two) times daily. 10 capsule 0  . Ginkgo Biloba 40 MG TABS Take 1 tablet daily by mouth.    [name] HYDROcodone-acetaminophen (NORCO) 10-325 MG tablet Take 1 tablet by mouth 3 (three) times daily.     . hydrOXYzine (ATARAX/VISTARIL) 25 MG tablet Take 2 tablets (50 mg total) by mouth every 6 (six) hours as needed for anxiety or itching. 240 tablet 0  . lamoTRIgine (LAMICTAL) 200 MG tablet Take 200 mg by mouth 2 (two) times daily.    [name] LATUDA 40 MG TABS tablet Take 40 mg daily by mouth.  0  .  levothyroxine (SYNTHROID, LEVOTHROID) 75 MCG tablet Take 1 tablet (75 mcg total) by mouth daily before breakfast. 90 tablet 3  . magnesium oxide (MAG-OX) 400 MG tablet Take 400 mg by mouth daily.    . Omega-3 Fatty Acids (FISH OIL) 1200 MG CAPS Take by mouth.    [name] omeprazole (PRILOSEC) 40 MG capsule TAKE 1 CAPSULE BY MOUTH EVERY DAY 90 capsule 0  . perphenazine (TRILAFON) 4 MG tablet TAKE 1 TABLET BY MOUTH TWICE A DAY FOR 3 DAYS THEN 2 TABLETS DAILY TWICE DAILY  0  . pravastatin (PRAVACHOL) 20 MG tablet Take  1 tablet (20 mg total) by mouth daily. 90 tablet 3  . traZODone (DESYREL) 100 MG tablet Take 300 mg by mouth at bedtime.     . Vitamin D, Ergocalciferol, (DRISDOL) 50000 units CAPS capsule Take 50,000 Units by mouth every 7 (seven) days.    . vitamin E 600 UNIT capsule Take 600 Units by mouth daily.     Current Facility-Administered Medications on File Prior to Visit  Medication Dose Route Frequency Provider Last Rate Last Dose  . methylPREDNISolone acetate (DEPO-MEDROL) injection 40 mg  40 mg Intramuscular Once Shawnee Knapp, MD       Allergies  Allergen Reactions  . No Known Allergies    Past Surgical History:  Procedure Laterality Date  . ABDOMINAL HYSTERECTOMY    . COLONOSCOPY W/ POLYPECTOMY    . JOINT REPLACEMENT Bilateral    knee  . KNEE ARTHROSCOPY Bilateral   . LEFT THYROID LOBECTOMY Left 06/16/2016   Performed by Greer Pickerel, MD at [facility]  . PARTIAL HYSTERECTOMY    . THYROID LOBECTOMY Left 06/16/2016   Family History  Problem Relation Age of Onset  . Diabetes Mother   . Heart disease Mother   . Hyperlipidemia Mother   . Hypertension Mother   . Stroke Mother   . Hypertension Father   . Parkinson's disease Father   . Heart disease Father   . Hyperlipidemia Father   . Hemachromatosis Daughter   . Thyroid disease Neg Hx    Social History   Socioeconomic History  . Marital status: Divorced    Spouse name: Not on file  . Number of children: Not on file  . Years  of education: Not on file  . Highest education level: Not on file  Social Needs  . Financial resource strain: Not on file  . Food insecurity - worry: Not on file  . Food insecurity - inability: Not on file  . Transportation needs - medical: Not on file  . Transportation needs - non-medical: Not on file  Occupational History  . Occupation: Geophysical data processor  Tobacco Use  . Smoking status: Former Smoker    Packs/day: 1.00    Years: 15.00    Pack years: 15.00    Last attempt to quit: 06/30/1979    Years since quitting: 38.0  . Smokeless tobacco: Never Used  Substance and Sexual Activity  . Alcohol use: Yes    Alcohol/week: 0.6 oz    Types: 1 Standard drinks or equivalent per week    Comment: 1 glass of red wine daily  . Drug use: No  . Sexual activity: Yes    Birth control/protection: None  Other Topics Concern  . Not on file  Social History Narrative   Divorced. Education: [employer]. Exercise: No.   Depression screen [facility] 2/9 07/13/2017 05/13/2017 12/19/2016 11/22/2016 10/28/2016  Decreased Interest 3 3 0 0 0  Down, Depressed, Hopeless 3 3 1 1  0  PHQ - 2 Score 6 6 1 1  0  Altered sleeping 0 0 - - -  Tired, decreased energy 3 3 - - -  Change in appetite 3 3 - - -  Feeling bad or failure about yourself  3 3 - - -  Trouble concentrating 3 3 - - -  Moving slowly or fidgety/restless 0 3 - - -  Suicidal thoughts 0 0 - - -  PHQ-9 Score 18 21 - - -  Difficult doing work/chores Somewhat difficult Somewhat difficult - - -    Review of  Systems  Constitutional: Positive for activity change, appetite change and fatigue. Negative for chills and fever.  Eyes: Negative for visual disturbance.  Gastrointestinal: Positive for constipation. Negative for abdominal distention and abdominal pain.  Genitourinary: Negative for hematuria.  Neurological: Positive for tremors and headaches.  Psychiatric/Behavioral: Positive for decreased concentration and dysphoric mood. Negative for agitation,  behavioral problems, hallucinations, self-injury, sleep disturbance and suicidal ideas. The patient is not hyperactive.   see hpi    Objective:   Physical Exam  Constitutional: She is oriented to person, place, and time. She appears well-developed and well-nourished. No distress.  HENT:  Head: Normocephalic and atraumatic.  Right Ear: A middle ear effusion (moderate) is present.  Left Ear: A middle ear effusion (moderate) is present.  R canal is irritated  Nares with erythema and narrowed Tonsillar papillae with L greater than R erythema and edema  Eyes: Conjunctivae and EOM are normal.  Neck: Neck supple. No tracheal deviation present. No thyroid mass present.  Cardiovascular: Normal rate and regular rhythm.  Murmur heard.  Systolic (injection; L upper sternal border) murmur is present with a grade of 2/6. Pulmonary/Chest: Effort normal and breath sounds normal. No respiratory distress.  Lungs clear. Good air movement.  Abdominal: Soft. Bowel sounds are normal.  Musculoskeletal: Normal range of motion.  Lymphadenopathy:    She has no cervical adenopathy.  Neurological: She is alert and oriented to person, place, and time.  Skin: Skin is warm and dry.  Psychiatric: She has a normal mood and affect. Her behavior is normal.  Nursing note and vitals reviewed.  BP (!) 128/56   Pulse 82   Temp 98 F (36.7 C)   Resp 16   Ht 5' 3.75" (1.619 m)   Wt 197 lb (89.4 kg)   SpO2 97%   BMI 34.08 kg/m     Assessment & Plan:     1. Essential hypertension - well controlled on current regimen. Cont amlodipine-olmesartan 10-40.  2. Vitamin D deficiency   3. Pure hypercholesterolemia - minimal improvement in lipids after 10 mos of pravastatin 20 LDL 174->154 and non-HDL 192->170 and tolerating pravastatin well w/ norm LFTs and CK so increase pravastatin to 40. Check lfts at f/u OV in 6-8 wks.  4. Benign cyst of right kidney   5. Chronic fatigue   6. Weakness generalized - check b12 and ck  - start otc b complex supp   7. Post-surgical hypothyroidism - trial of increasing levothyroxine from 75 to 152mcg due to worsening sxs w/ hair, mood, fatigue, weakness.  Needs f/u OV for repeat tsh ON levothyroxine 100 in 6-8 wks and definitely before any refills of it as today's TSH now actually appears to have dropped back down to 1.4 w/ total and free T4 in mid-range so highly doubt we will be able to keep pt on the 187mcg - will likely be to much for her but ok to try due to the severity of sxs. (If pt failed to keep f/u and requests levothyroxine refill - refill at either the 75 or 88 mcg dose only.  8. Medication monitoring encounter - Bmp since started arb 2 mos ago.   Needs to see neuro to address her concerns for developing Parkinson's disease (father had it, now she has developed tremor and worsening depression) and long-standing h/o OSA not on CPAP.   Orders Placed This Encounter  Procedures  . Thyroid Panel With TSH  . Vitamin B12  . Basic metabolic panel    Order  Specific Question:   Has the patient fasted?    Answer:   No  . CK    Meds ordered this encounter  Medications  . Vitamin D, Ergocalciferol, (DRISDOL) 50000 units CAPS capsule    Sig: Take 1 capsule (50,000 Units total) every 7 (seven) days by mouth.    Dispense:  30 capsule    Refill:  0  . Cholecalciferol (VITAMIN D-3) 5000 units TABS    Sig: Take 5,000 Units daily by mouth. In addition to the rx high dose once weekly vitamin D of 50Ku.    Dispense:  30 tablet  . pravastatin (PRAVACHOL) 40 MG tablet    Sig: Take 1 tablet (40 mg total) daily by mouth.    Dispense:  90 tablet    Refill:  3  . levothyroxine (SYNTHROID, LEVOTHROID) 100 MCG tablet    Sig: Take 1 tablet (100 mcg total) daily before breakfast by mouth.    Dispense:  90 tablet    Refill:  0    D/c all other rx for levothyroxine.  [name] amLODipine-olmesartan (AZOR) 10-40 MG tablet    Sig: Take 1 tablet by mouth daily.    Dispense:  90 tablet     Refill:  3    [name], M.D.  Primary Care at [facility] [address] [place], [place] [address] [phone] phone [phone] fax  07/27/17 7:58 AM

## 2017-07-24 NOTE — Patient Outreach (Addendum)
[facility]) Care Management  07/24/2017  Becky Lawson [birth year] [id number]   Called [employer] to follow up on options for Latuda.  Spoke with Talbert Nan who explained [name] is already a tier three medication.  The patient could have her provider put in a request for a tier exception but she would still be responsible for a larger portion of the actual cost of the medication because she is in the coverage gap.    The patient assistance program that provides Latuda at no cost states patients with Medicare Part D are not eligible for their program.    Patient was called. HIPAA identifiers were obtained. Patient was asked to reach out to her provider last week to inquire about samples. She reported she was just leaving Dr. Jonelle Sports office at the time of our call. He did not have samples so he changed her therapy to Seroquel (Quetiapine) 100mg  with a taper dose of:  100mg  daily for 3 days, 200mg  daily for 3 days then 300 mg daily thereafter.  Patient's medication list was updated with the change.  Potential Drug-Drug interaction- High dose trazodone/Quetiapine/lamotrigine combination could but the patient at risk for falls, decreased cognition, and increased drowsiness due to CNS depression.  Plan: Patient's case will be closed since her therapy was changed and she no longer needs medication assistance with Latuda.  Elayne Guerin, PharmD, [place] Clinical Pharmacist [phone]

## 2017-07-25 LAB — CK: CK TOTAL: 93 U/L (ref 24–173)

## 2017-07-25 LAB — VITAMIN B12: Vitamin B-12: 350 pg/mL (ref 232–1245)

## 2017-07-25 LAB — BASIC METABOLIC PANEL
BUN/Creatinine Ratio: 13 (ref 12–28)
BUN: 12 mg/dL (ref 8–27)
CHLORIDE: 104 mmol/L (ref 96–106)
CO2: 23 mmol/L (ref 20–29)
CREATININE: 0.96 mg/dL (ref 0.57–1.00)
Calcium: 9.2 mg/dL (ref 8.7–10.3)
GFR calc Af Amer: 71 mL/min/{1.73_m2} (ref >59)
GFR calc non Af Amer: 62 mL/min/{1.73_m2} (ref >59)
GLUCOSE: 114 mg/dL — AB (ref 65–99)
Potassium: 4.7 mmol/L (ref 3.5–5.2)
SODIUM: 143 mmol/L (ref 134–144)

## 2017-07-25 LAB — THYROID PANEL WITH TSH
Free Thyroxine Index: 2.2 (ref 1.2–4.9)
T3 Uptake Ratio: 26 % (ref 24–39)
T4, Total: 8.4 ug/dL (ref 4.5–12.0)
TSH: 1.41 u[IU]/mL (ref 0.450–4.500)

## 2017-07-27 MED ORDER — AMLODIPINE-OLMESARTAN 10-40 MG PO TABS: 1.0000 | ORAL_TABLET | Freq: Every day | ORAL | 3 refills | Status: DC

## 2017-08-03 DIAGNOSIS — M5136 Other intervertebral disc degeneration, lumbar region: Secondary | ICD-10-CM | POA: Diagnosis not present

## 2017-08-03 DIAGNOSIS — M5442 Lumbago with sciatica, left side: Secondary | ICD-10-CM | POA: Diagnosis not present

## 2017-08-03 DIAGNOSIS — G8929 Other chronic pain: Secondary | ICD-10-CM | POA: Diagnosis not present

## 2017-08-28 ENCOUNTER — Other Ambulatory Visit: Payer: Self-pay | Admitting: Physician Assistant

## 2017-08-28 NOTE — Telephone Encounter (Signed)
  Does Dr Brigitte Pulse want the pt to continue this med? Per note 07/19/17 pt states that the OTC version was coated and upset her stomach.

## 2017-09-26 DIAGNOSIS — F319 Bipolar disorder, unspecified: Secondary | ICD-10-CM | POA: Diagnosis not present

## 2017-10-20 ENCOUNTER — Other Ambulatory Visit: Payer: Self-pay | Admitting: Family Medicine

## 2017-10-21 ENCOUNTER — Other Ambulatory Visit: Payer: Self-pay

## 2017-10-21 ENCOUNTER — Encounter: Payer: Self-pay | Admitting: Family Medicine

## 2017-10-21 ENCOUNTER — Ambulatory Visit (INDEPENDENT_AMBULATORY_CARE_PROVIDER_SITE_OTHER): Payer: PPO | Admitting: Family Medicine

## 2017-10-21 VITALS — BP 162/91 | HR 78 | Temp 98.1°F | Resp 16 | Ht 63.75 in | Wt 195.0 lb

## 2017-10-21 DIAGNOSIS — E042 Nontoxic multinodular goiter: Secondary | ICD-10-CM

## 2017-10-21 DIAGNOSIS — Z5181 Encounter for therapeutic drug level monitoring: Secondary | ICD-10-CM

## 2017-10-21 DIAGNOSIS — I10 Essential (primary) hypertension: Secondary | ICD-10-CM | POA: Diagnosis not present

## 2017-10-21 DIAGNOSIS — E890 Postprocedural hypothyroidism: Secondary | ICD-10-CM | POA: Diagnosis not present

## 2017-10-21 DIAGNOSIS — Z9889 Other specified postprocedural states: Secondary | ICD-10-CM

## 2017-10-21 DIAGNOSIS — E559 Vitamin D deficiency, unspecified: Secondary | ICD-10-CM | POA: Diagnosis not present

## 2017-10-21 MED ORDER — CLOTRIMAZOLE-BETAMETHASONE 1-0.05 % EX CREA: 1.0000 "application " | TOPICAL_CREAM | Freq: Two times a day (BID) | CUTANEOUS | 0 refills | Status: DC

## 2017-10-21 MED ORDER — AMLODIPINE BESYLATE 10 MG PO TABS: 10.0000 mg | ORAL_TABLET | Freq: Every day | ORAL | 3 refills | Status: DC

## 2017-10-21 MED ORDER — OLMESARTAN MEDOXOMIL 40 MG PO TABS: 40.0000 mg | ORAL_TABLET | Freq: Every day | ORAL | 1 refills | Status: DC

## 2017-10-21 NOTE — Patient Instructions (Addendum)
I referred you to [facility] to evaluated your tremor and the non-restorative sleep/possibility of obstructive sleep apnea last September. They noted that you were welcome to call [phone] to reschedule. I am happy to replace referral if needed.  Around the end of the month, please come by the office to have your labs checked. (You can walk-in to the [place] appointment clinic for this and let the front desk that you are there for a lab-only visit and you do not need to see a provider.)  Please let them know what your blood pressures have been running about on the new medication so they can pass that on to me.    IF you received an x-ray today, you will receive an invoice from [facility]. Please contact [facility] at [phone] with questions or concerns regarding your invoice.   IF you received labwork today, you will receive an invoice from [place]. Please contact [facility] at [phone] with questions or concerns regarding your invoice.   Our billing staff will not be able to assist you with questions regarding bills from these companies.  You will be contacted with the lab results as soon as they are available. The fastest way to get your results is to activate your My Chart account. Instructions are located on the last page of this paperwork. If you have not heard from [place] regarding the results in 2 weeks, please contact this office.     Managing Your Hypertension Hypertension is commonly called high blood pressure. This is when the force of your blood pressing against the walls of your arteries is too strong. Arteries are blood vessels that carry blood from your heart throughout your body. Hypertension forces the heart to work harder to pump blood, and may cause the arteries to become narrow or stiff. Having untreated or uncontrolled hypertension can cause heart attack, stroke, kidney disease, and other  problems. What are blood pressure readings? A blood pressure reading consists of a higher number over a lower number. Ideally, your blood pressure should be below 120/80. The first ("top") number is called the systolic pressure. It is a measure of the pressure in your arteries as your heart beats. The second ("bottom") number is called the diastolic pressure. It is a measure of the pressure in your arteries as the heart relaxes. What does my blood pressure reading mean? Blood pressure is classified into four stages. Based on your blood pressure reading, your health care provider may use the following stages to determine what type of treatment you need, if any. Systolic pressure and diastolic pressure are measured in a unit called mm Hg. Normal  Systolic pressure: below 024.  Diastolic pressure: below 80. Elevated  Systolic pressure: 097-353.  Diastolic pressure: below 80. Hypertension stage 1  Systolic pressure: 299-242.  Diastolic pressure: 68-34. Hypertension stage 2  Systolic pressure: 196 or above.  Diastolic pressure: 90 or above. What health risks are associated with hypertension? Managing your hypertension is an important responsibility. Uncontrolled hypertension can lead to:  A heart attack.  A stroke.  A weakened blood vessel (aneurysm).  Heart failure.  Kidney damage.  Eye damage.  Metabolic syndrome.  Memory and concentration problems.  What changes can I make to manage my hypertension? Hypertension can be managed by making lifestyle changes and possibly by taking medicines. Your health care provider will help you make a plan to bring your blood pressure within a normal range. Eating and drinking  Eat a diet that  is high in fiber and potassium, and low in salt (sodium), added sugar, and fat. An example eating plan is called the DASH (Dietary Approaches to Stop Hypertension) diet. To eat this way: ? Eat plenty of fresh fruits and vegetables. Try to fill half  of your plate at each meal with fruits and vegetables. ? Eat whole grains, such as whole wheat pasta, brown rice, or whole grain bread. Fill about one quarter of your plate with whole grains. ? Eat low-fat diary products. ? Avoid fatty cuts of meat, processed or cured meats, and poultry with skin. Fill about one quarter of your plate with lean proteins such as fish, chicken without skin, beans, eggs, and tofu. ? Avoid premade and processed foods. These tend to be higher in sodium, added sugar, and fat.  Reduce your daily sodium intake. Most people with hypertension should eat less than 1,500 mg of sodium a day.  Limit alcohol intake to no more than 1 drink a day for nonpregnant women and 2 drinks a day for men. One drink equals 12 oz of beer, 5 oz of wine, or 1 oz of hard liquor. Lifestyle  Work with your health care provider to maintain a healthy body weight, or to lose weight. Ask what an ideal weight is for you.  Get at least 30 minutes of exercise that causes your heart to beat faster (aerobic exercise) most days of the week. Activities may include walking, swimming, or biking.  Include exercise to strengthen your muscles (resistance exercise), such as weight lifting, as part of your weekly exercise routine. Try to do these types of exercises for 30 minutes at least 3 days a week.  Do not use any products that contain nicotine or tobacco, such as cigarettes and e-cigarettes. If you need help quitting, ask your health care provider.  Control any long-term (chronic) conditions you have, such as high cholesterol or diabetes. Monitoring  Monitor your blood pressure at home as told by your health care provider. Your personal target blood pressure may vary depending on your medical conditions, your age, and other factors.  Have your blood pressure checked regularly, as often as told by your health care provider. Working with your health care provider  Review all the medicines you take with  your health care provider because there may be side effects or interactions.  Talk with your health care provider about your diet, exercise habits, and other lifestyle factors that may be contributing to hypertension.  Visit your health care provider regularly. Your health care provider can help you create and adjust your plan for managing hypertension. Will I need medicine to control my blood pressure? Your health care provider may prescribe medicine if lifestyle changes are not enough to get your blood pressure under control, and if:  Your systolic blood pressure is 130 or higher.  Your diastolic blood pressure is 80 or higher.  Take medicines only as told by your health care provider. Follow the directions carefully. Blood pressure medicines must be taken as prescribed. The medicine does not work as well when you skip doses. Skipping doses also puts you at risk for problems. Contact a health care provider if:  You think you are having a reaction to medicines you have taken.  You have repeated (recurrent) headaches.  You feel dizzy.  You have swelling in your ankles.  You have trouble with your vision. Get help right away if:  You develop a severe headache or confusion.  You have unusual weakness or  numbness, or you feel faint.  You have severe pain in your chest or abdomen.  You vomit repeatedly.  You have trouble breathing. Summary  Hypertension is when the force of blood pumping through your arteries is too strong. If this condition is not controlled, it may put you at risk for serious complications.  Your personal target blood pressure may vary depending on your medical conditions, your age, and other factors. For most people, a normal blood pressure is less than 120/80.  Hypertension is managed by lifestyle changes, medicines, or both. Lifestyle changes include weight loss, eating a healthy, low-sodium diet, exercising more, and limiting alcohol. This information is  not intended to replace advice given to you by your health care provider. Make sure you discuss any questions you have with your health care provider. Document Released: 05/16/2012 Document Revised: 07/20/2016 Document Reviewed: 07/20/2016 Elsevier Interactive Patient Education  [employer].

## 2017-10-21 NOTE — Progress Notes (Addendum)
By signing my name below, I, [name], attest that this documentation has been prepared under the direction and in the presence of [name] Arrow, MD. Electronically Signed: [name], Medical Scribe 10/21/2017 at 1:52 PM. Subjective:    Patient ID: Becky Lawson, female    DOB: 07-25-1951, 67 y.o.   MRN: [id number]  HPI Chief Complaint  Patient presents with  . Hypertension    patient presents to discuss HTN, unable to take Azor due to cost  Becky Lawson is a 67 y.o. female who presents to Primary Care at [facility] for HTN, she is unable to take Azor due to cost. She states she had to go off some of her psych meds to pay for it. Her BP is 160/90 at home before she takes her BP medication.    Vitamin D: She is taking her Vitamin D supplement 50000 IUs weekly. She states she does not go outside and wants to keep staking her supplements.   Psych: She was noticing a new tremor, her psychiatrist diagnosed her with tardive dyskinesia and  started her on ingrezza. She has fatigue and states when she wakes up, she eats and then goes and sits in her chair. She sleeps well, but wakes up feeling exhausted. Her psychiatrist recommended her to have a sleep study. She is not exercising. She dreams and has occasionally disconcerting dreams.  Medications: She was taking 1.5 tablets of her thyroid medication, but increased her water intake and has felt fine. She did not increase her pravastatin.   Skin: She also complains of an irritating finger nail on her 1st digit on her R hand.    Past Medical History:  Diagnosis Date  . Anxiety   . Arthritis   . Benign cyst of right kidney 2014   2.7 cm right kidney cyst seen on imaging 2014 but was c/o right flank pain with new persistent proteinuria (fortunately hematuria had resolved) so repeat US 11/2016 showed right kidney still with 2.6 cm simple cyst and otherwise nml.  . Bipolar 1 disorder ([place])   . Chronic kidney disease   . Depression   . GERD  (gastroesophageal reflux disease)   . Heart murmur   . Hepatitis C    resolved completely after treatment in 2014, genotype 1b, [facility]  . Hyperlipidemia   . Hypertension   . Jaundice 11/01/2012  . Pruritic disorder 02/13/2013  . Sleep apnea    Was diagnosed approximately 20 years ago, but does not wear CPAP patient stated "I gave it back.Marland KitchenMarland KitchenI couldn't wear that thing it was horrible"  . Spinal stenosis    Past Surgical History:  Procedure Laterality Date  . ABDOMINAL HYSTERECTOMY    . COLONOSCOPY W/ POLYPECTOMY    . JOINT REPLACEMENT Bilateral    knee  . KNEE ARTHROSCOPY Bilateral   . PARTIAL HYSTERECTOMY    . THYROID LOBECTOMY Left 06/16/2016  . THYROID LOBECTOMY Left 06/16/2016   Procedure: LEFT THYROID LOBECTOMY;  Surgeon: Greer Pickerel, MD;  Location: [place];  Service: General;  Laterality: Left;   Current Outpatient Medications on File Prior to Visit  Medication Sig Dispense Refill  . ALPRAZolam (XANAX) 1 MG tablet Take 1 mg by mouth at bedtime as needed for anxiety.    [name] amLODipine-olmesartan (AZOR) 10-40 MG tablet Take 1 tablet by mouth daily. 90 tablet 3  . buPROPion (WELLBUTRIN XL) 300 MG 24 hr tablet Take 300 mg by mouth daily.  0  . Cholecalciferol (VITAMIN D-3) 5000 units TABS  Take 5,000 Units daily by mouth. In addition to the rx high dose once weekly vitamin D of 50Ku. 30 tablet   . Coenzyme Q10 (CO Q-10) 300 MG CAPS Take 1 capsule daily by mouth.    . docusate sodium (COLACE) 100 MG capsule Take 1 capsule (100 mg total) by mouth 2 (two) times daily. 10 capsule 0  . Ginkgo Biloba 40 MG TABS Take 1 tablet daily by mouth.    [name] HYDROcodone-acetaminophen (NORCO) 10-325 MG tablet Take 1 tablet by mouth 3 (three) times daily.     . hydrOXYzine (ATARAX/VISTARIL) 25 MG tablet Take 2 tablets (50 mg total) by mouth every 6 (six) hours as needed for anxiety or itching. 240 tablet 0  . lamoTRIgine (LAMICTAL) 200 MG tablet Take 200 mg by mouth 2 (two) times daily.    [name]  levothyroxine (SYNTHROID, LEVOTHROID) 100 MCG tablet TAKE 1 TABLET (100 MCG TOTAL) DAILY BEFORE BREAKFAST BY MOUTH. 90 tablet 0  . magnesium oxide (MAG-OX) 400 MG tablet Take 400 mg by mouth daily.    . Omega-3 Fatty Acids (FISH OIL) 1200 MG CAPS Take by mouth.    [name] omeprazole (PRILOSEC) 40 MG capsule TAKE 1 CAPSULE BY MOUTH EVERY DAY 90 capsule 0  . perphenazine (TRILAFON) 4 MG tablet TAKE 1 TABLET BY MOUTH TWICE A DAY FOR 3 DAYS THEN 2 TABLETS DAILY TWICE DAILY  0  . pravastatin (PRAVACHOL) 40 MG tablet Take 1 tablet (40 mg total) daily by mouth. 90 tablet 3  . QUEtiapine (SEROQUEL) 100 MG tablet Take 100 mg daily for 3 days then increase to 200 mg daily for 3 days then increase to 300 mg daily thereafter.    . traZODone (DESYREL) 100 MG tablet Take 300 mg by mouth at bedtime.     . Vitamin D, Ergocalciferol, (DRISDOL) 50000 units CAPS capsule Take 1 capsule (50,000 Units total) every 7 (seven) days by mouth. 30 capsule 0  . vitamin E 600 UNIT capsule Take 600 Units by mouth daily.     Current Facility-Administered Medications on File Prior to Visit  Medication Dose Route Frequency Provider Last Rate Last Dose  . methylPREDNISolone acetate (DEPO-MEDROL) injection 40 mg  40 mg Intramuscular Once Shawnee Knapp, MD       Allergies  Allergen Reactions  . No Known Allergies    Family History  Problem Relation Age of Onset  . Diabetes Mother   . Heart disease Mother   . Hyperlipidemia Mother   . Hypertension Mother   . Stroke Mother   . Hypertension Father   . Parkinson's disease Father   . Heart disease Father   . Hyperlipidemia Father   . Hemachromatosis Daughter   . Thyroid disease Neg Hx    Social History   Socioeconomic History  . Marital status: Divorced    Spouse name: Not on file  . Number of children: Not on file  . Years of education: Not on file  . Highest education level: Not on file  Social Needs  . Financial resource strain: Not on file  . Food insecurity - worry:  Not on file  . Food insecurity - inability: Not on file  . Transportation needs - medical: Not on file  . Transportation needs - non-medical: Not on file  Occupational History  . Occupation: Geophysical data processor  Tobacco Use  . Smoking status: Former Smoker    Packs/day: 1.00    Years: 15.00    Pack years: 15.00    Last  attempt to quit: 06/30/1979    Years since quitting: 38.3  . Smokeless tobacco: Never Used  Substance and Sexual Activity  . Alcohol use: Yes    Alcohol/week: 0.6 oz    Types: 1 Standard drinks or equivalent per week    Comment: 1 glass of red wine daily  . Drug use: No  . Sexual activity: Yes    Birth control/protection: None  Other Topics Concern  . Not on file  Social History Narrative   Divorced. Education: [employer]. Exercise: No.   Depression screen [facility] 2/9 07/24/2017 07/13/2017 05/13/2017 12/19/2016 11/22/2016  Decreased Interest 3 3 3  0 0  Down, Depressed, Hopeless 3 3 3 1 1   PHQ - 2 Score 6 6 6 1 1   Altered sleeping 1 0 0 - -  Tired, decreased energy 3 3 3  - -  Change in appetite 3 3 3  - -  Feeling bad or failure about yourself  3 3 3  - -  Trouble concentrating 3 3 3  - -  Moving slowly or fidgety/restless 3 0 3 - -  Suicidal thoughts 0 0 0 - -  PHQ-9 Score 22 18 21  - -  Difficult doing work/chores Very difficult Somewhat difficult Somewhat difficult - -     Review of Systems  Constitutional: Positive for fatigue. Negative for activity change and appetite change.  Respiratory: Negative for shortness of breath.   Neurological: Positive for tremors.       Objective:   Physical Exam  Constitutional: She is oriented to person, place, and time. She appears well-developed and well-nourished. No distress.  HENT:  Head: Normocephalic and atraumatic.  Right Ear: External ear normal.  Left Ear: External ear normal.  Eyes: Conjunctivae are normal. No scleral icterus.  Neck: Normal range of motion. Neck supple. No thyromegaly present.  Cardiovascular:  Normal rate, regular rhythm, S1 normal, S2 normal, normal heart sounds and intact distal pulses.  No murmur heard. Pulmonary/Chest: Effort normal and breath sounds normal. No respiratory distress.  Lungs clear, good air movement.  Musculoskeletal: She exhibits no edema.  Lymphadenopathy:    She has no cervical adenopathy.  Neurological: She is alert and oriented to person, place, and time.  Skin: Skin is warm and dry. She is not diaphoretic. No erythema.  dry, cracked skin. No fluctuance, erythema, or drainage.  Psychiatric: She has a normal mood and affect. Her behavior is normal.    Vitals:   10/21/17 1540  BP: (!) 162/91  Pulse: 78  Resp: 16  Temp: 98.1 F (36.7 C)  TempSrc: Oral  SpO2: 94%  Weight: 195 lb (88.5 kg)  Height: 5' 3.75" (1.619 m)  \     Assessment & Plan:     1. Multinodular goiter   2. Post-surgical hypothyroidism   3. S/P partial thyroidectomy - for thyroid cancer, needs f/u US for other remaining half of thyroid gland  4. Vitamin D deficiency -Ok to refill vitamin D x1 when requested.  5. Medication monitoring encounter - RTC in 2 wks for lab only visit  6,      HTN - azor combo pill to $$ so splint into 2 rxs for olmesartan 40 and amlodipine 10.  Check BP outside office and call/bring in readings in a month.   Referred to [facility] for tremor and PSA for non-restorative sleep/ ? OSA but pt had to cancel her appt due to copays - she is ready to call and reschedule so # given.  Try lotrisone around Rt 1st  fingerneail  Orders Placed This Encounter  Procedures  . US THYROID    Standing Status:   Future    Standing Expiration Date:   12/20/2018    Order Specific Question:   Reason for Exam (SYMPTOM  OR DIAGNOSIS REQUIRED)    Answer:   annual f/u of small right mid-thyroid nodule    Order Specific Question:   Preferred imaging location?    Answer:   [facility]  . Comprehensive metabolic panel    Standing Status:   Future    Standing Expiration  Date:   10/21/2018  . [id number]    Standing Status:   Future    Standing Expiration Date:   10/21/2018  . VITAMIN D 25 Hydroxy (Vit-D Deficiency, Fractures)    Standing Status:   Future    Standing Expiration Date:   10/21/2018    Meds ordered this encounter  Medications  . amLODipine (NORVASC) 10 MG tablet    Sig: Take 1 tablet (10 mg total) by mouth daily.    Dispense:  90 tablet    Refill:  3  . olmesartan (BENICAR) 40 MG tablet    Sig: Take 1 tablet (40 mg total) by mouth daily.    Dispense:  90 tablet    Refill:  1  . clotrimazole-betamethasone (LOTRISONE) cream    Sig: Apply 1 application topically 2 (two) times daily.    Dispense:  15 g    Refill:  0    I personally performed the services described in this documentation, which was scribed in my presence. The recorded information has been reviewed and considered, and addended by me as needed.   [name], M.D.  Primary Care at [facility] [address] [place], [place] [address] [phone] phone [phone] fax  10/24/17 2:39 PM

## 2017-10-25 NOTE — Addendum Note (Signed)
Addended by: Shawnee Knapp on: 10/25/2017 02:48 AM   Modules accepted: Orders

## 2017-11-07 ENCOUNTER — Encounter: Payer: Self-pay | Admitting: Family Medicine

## 2017-11-07 ENCOUNTER — Ambulatory Visit
Admission: RE | Admit: 2017-11-07 | Discharge: 2017-11-07 | Disposition: A | Discharge status: Home / Self Care | Payer: PPO | Source: Ambulatory Visit | Attending: Family Medicine | Admitting: Family Medicine

## 2017-11-07 DIAGNOSIS — E041 Nontoxic single thyroid nodule: Secondary | ICD-10-CM | POA: Diagnosis not present

## 2017-11-07 DIAGNOSIS — E890 Postprocedural hypothyroidism: Secondary | ICD-10-CM

## 2017-11-07 DIAGNOSIS — E042 Nontoxic multinodular goiter: Secondary | ICD-10-CM

## 2017-11-07 DIAGNOSIS — Z9889 Other specified postprocedural states: Secondary | ICD-10-CM

## 2017-11-28 DIAGNOSIS — F319 Bipolar disorder, unspecified: Secondary | ICD-10-CM | POA: Diagnosis not present

## 2017-11-30 ENCOUNTER — Other Ambulatory Visit: Payer: Self-pay | Admitting: Family Medicine

## 2017-12-13 ENCOUNTER — Telehealth: Payer: Self-pay

## 2017-12-13 MED ORDER — CANDESARTAN CILEXETIL 32 MG PO TABS: 32.0000 mg | ORAL_TABLET | Freq: Every day | ORAL | 1 refills | Status: DC

## 2017-12-13 NOTE — Telephone Encounter (Signed)
[facility] fax req re: Olmesartan medoxomil 20 mg back ordered. - needs alternative Sent to Dr. Brigitte Pulse

## 2017-12-13 NOTE — Telephone Encounter (Signed)
Pt is rx'd olmesartan 40, not 20. Switched to candesartan 32 and put note on rx that it is to be used in place of olmesartan - not w/ it.

## 2018-01-02 DIAGNOSIS — M25551 Pain in right hip: Secondary | ICD-10-CM | POA: Diagnosis not present

## 2018-01-04 DIAGNOSIS — F319 Bipolar disorder, unspecified: Secondary | ICD-10-CM | POA: Diagnosis not present

## 2018-01-05 DIAGNOSIS — G894 Chronic pain syndrome: Secondary | ICD-10-CM | POA: Diagnosis not present

## 2018-01-05 DIAGNOSIS — Z79891 Long term (current) use of opiate analgesic: Secondary | ICD-10-CM | POA: Diagnosis not present

## 2018-01-14 ENCOUNTER — Other Ambulatory Visit: Payer: Self-pay | Admitting: Family Medicine

## 2018-01-17 ENCOUNTER — Telehealth: Payer: Self-pay | Admitting: Family Medicine

## 2018-01-17 DIAGNOSIS — F319 Bipolar disorder, unspecified: Secondary | ICD-10-CM | POA: Diagnosis not present

## 2018-01-17 DIAGNOSIS — R251 Tremor, unspecified: Secondary | ICD-10-CM

## 2018-01-17 NOTE — Telephone Encounter (Signed)
Copied from [place] [phone]. Topic: Referral - Request >> Jan 16, 2018  2:56 PM Lennox Solders wrote: Reason for CRM:pt had a referral last year to neurologist for tremors. Pt did not go and needs a new referral. Pt has [employer] health advantage  -----------------------------------------------------------------------------------------  Please advise. Thanks!

## 2018-01-18 NOTE — Telephone Encounter (Signed)
Placed referral  

## 2018-01-25 DIAGNOSIS — M25551 Pain in right hip: Secondary | ICD-10-CM | POA: Diagnosis not present

## 2018-01-31 DIAGNOSIS — M7061 Trochanteric bursitis, right hip: Secondary | ICD-10-CM | POA: Diagnosis not present

## 2018-01-31 DIAGNOSIS — M1611 Unilateral primary osteoarthritis, right hip: Secondary | ICD-10-CM | POA: Diagnosis not present

## 2018-02-08 DIAGNOSIS — F319 Bipolar disorder, unspecified: Secondary | ICD-10-CM | POA: Diagnosis not present

## 2018-02-20 ENCOUNTER — Ambulatory Visit (INDEPENDENT_AMBULATORY_CARE_PROVIDER_SITE_OTHER): Payer: PPO | Admitting: Neurology

## 2018-02-20 ENCOUNTER — Encounter: Payer: Self-pay | Admitting: Neurology

## 2018-02-20 VITALS — BP 157/91 | HR 75 | Ht 64.0 in | Wt 184.0 lb

## 2018-02-20 DIAGNOSIS — G20 Parkinson's disease: Secondary | ICD-10-CM | POA: Diagnosis not present

## 2018-02-20 DIAGNOSIS — R251 Tremor, unspecified: Secondary | ICD-10-CM | POA: Diagnosis not present

## 2018-02-20 NOTE — Patient Instructions (Addendum)
I think you have signs and symptoms of parkinson's like disease, called parkinsonism. You may be at risk for parkinson's disease based on your family history. However, based on your psychotropic medication, you are at risk for medication induced parkinsonism; overall findings are mild.    You may have some symptoms that can mimic parkinson's disease. This can affect your balance, your memory, your mood, your bowel and bladder function, your posture, balance and walking and your activities of daily living.     I do want to suggest a few things today:   Remember to drink plenty of fluid at least 6 glasses (8 oz each), eat healthy meals and do not skip any meals. Try to eat protein with a every meal and eat a healthy snack such as fruit or nuts in between meals. Try to keep a regular sleep-wake schedule and try to exercise daily, particularly in the form of walking, 20-30 minutes a day, if you can.   Try to stay active physically and mentally. Engage in social activities in your community and with your family and try to keep up with current events by reading the newspaper or watching the news.   As far as your medications are concerned, I would like hold off on any meds at this time.   I do not see any telltale signs of tardive dyskinesias, talk to [facility] about potentially coming off the [employer].   As far as diagnostic testing, I will order: DaT scan: This is a specialized brain scan designed to help with diagnosis of tremor disorders. A radioactive marker gets injected and the uptake is measured in the brain and compared to normal controls and right side is compared to the left, a change in uptake can help with diagnosis of certain tremor disorders. A brain MRI on the other hand is a brain scan that helps look at the brain structure in more detail overall and look for age-related changes, blood vessel related changes and look for stroke and volume loss which we call atrophy.  It is recommended,  that you taper off the Wellbutrin in preparation for this scan. You will have to discuss with [name] how to taper and it is recommended, that you are off the medication completely for at least 8 days prior to the scan.  I would like to see you back in about 4 months, sooner if we need to. Please call us with any interim questions, concerns, problems, updates or refill requests.  Our phone number is [phone]. We also have an after hours call service for urgent matters and there is a physician on-call for urgent questions, that cannot wait till the next work day. For any emergencies you know to call 911 or go to the nearest emergency room.

## 2018-02-20 NOTE — Progress Notes (Signed)
Subjective:    Becky Lawson ID: Becky Lawson is a 67 y.o. female.  HPI     [name], MD, PhD [facility] [address], [address] [place], [place] [address]  Dear Dr. Brigitte Pulse,   I saw your Becky Lawson, Becky Lawson, upon your kind request, in my neurologic clinic today for initial consultation of her tremor. The Becky Lawson is unaccompanied today. As you know, Becky Lawson is a 67 year old left-handed woman with an underlying medical history of reflux disease, mood disorder with a diagnosis of bipolar disorder, abnormal liver function tests, lumbar spinal stenosis, hypothyroidism, obesity, sleep apnea, hypertension, hyperlipidemia, and arthritis, who reports a bilateral upper extremity tremor for the past 1 year, progressive, more so on the right. Becky Lawson has noticed changes in her handwriting. Becky Lawson writes with her left hand. Becky Lawson does not have any brothers or sisters. Becky Lawson also reports a family history of Parkinson's disease in her father. I reviewed your office note from 10/21/2017. Becky Lawson was started on Ingrezza for tardive dyskinesias by her psychiatrist. Becky Lawson sees [name] for psychiatry. Becky Lawson has been on [name] for months. Becky Lawson was recently started on Ingrezza, but has not noticed an improvement in her tremors. Her tremor seems to be more on the right side than left. Her father lived to be 43 years old and also had strokes. Her mother lived to be 70 years old and had congestive heart failure, also history of stroke. Becky Lawson was seen in this office several years ago by Dr. love. Becky Lawson had a brain MRI at the time secondary to tremors and frequent falls. Becky Lawson also had a sleep evaluation with Dr. [name] in 2011. Becky Lawson had an EEG in 2009 which was normal in the awake and light sleep stages. Becky Lawson has a long-standing history of bipolar disease. Becky Lawson has been on multiple different medications in the past but does not recall trying Abilify, Geodon, or Haldol. Becky Lawson has been on this but all in the past and has  been on lithium but had significant side effects on it. Becky Lawson is divorced for many years, Becky Lawson has 1 son, 1 daughter, neither have tremors. Becky Lawson has no other family history of tremors. Becky Lawson lives alone. Becky Lawson does not smoke and does not drink: A regular basis, drinks caffeine in the form of coffee, one cup per day on average. Becky Lawson takes Xanax for anxiety and also at night for sleep. Becky Lawson endorses increase in anxiety recently, increase in stress. Becky Lawson also is on Wellbutrin 300 mg strength once daily long-acting. Of note, in reviewing her previous office records, Becky Lawson has had a bilateral hand tremor for over 10 years, was previously labeled with essential tremor.  Her Past Medical History Is Significant For: Past Medical History:  Diagnosis Date  . Anxiety   . Arthritis   . Benign cyst of right kidney 2014   2.7 cm right kidney cyst seen on imaging 2014 but was c/o right flank pain with new persistent proteinuria (fortunately hematuria had resolved) so repeat US 11/2016 showed right kidney still with 2.6 cm simple cyst and otherwise nml.  . Bipolar 1 disorder ([place])   . Chronic kidney disease   . Depression   . GERD (gastroesophageal reflux disease)   . Heart murmur   . 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 Hyperlipidemia   . Hypertension   . Jaundice 11/01/2012  . Pruritic disorder 02/13/2013  . Sleep apnea    Was  diagnosed approximately 20 years ago, but does not wear CPAP Becky Lawson stated "I gave it back.Marland KitchenMarland KitchenI couldn't wear that thing it was horrible"  . Spinal stenosis     Her Past Surgical History Is Significant For: Past Surgical History:  Procedure Laterality Date  . ABDOMINAL HYSTERECTOMY    . COLONOSCOPY W/ POLYPECTOMY    . JOINT REPLACEMENT Bilateral    knee  . KNEE ARTHROSCOPY Bilateral   . PARTIAL HYSTERECTOMY    . THYROID LOBECTOMY Left 06/16/2016  . THYROID LOBECTOMY Left 06/16/2016   Procedure: LEFT THYROID LOBECTOMY;  Surgeon:  Greer Pickerel, MD;  Location: [facility];  Service: General;  Laterality: Left;    Her Family History Is Significant For: Family History  Problem Relation Age of Onset  . Diabetes Mother   . Heart disease Mother   . Hyperlipidemia Mother   . Hypertension Mother   . Stroke Mother   . Hypertension Father   . Parkinson's disease Father   . Heart disease Father   . Hyperlipidemia Father   . Hemachromatosis Daughter   . Thyroid disease Neg Hx     Her Social History Is Significant For: Social History   Socioeconomic History  . Marital status: Divorced    Spouse name: Not on file  . Number of children: Not on file  . Years of education: Not on file  . Highest education level: Not on file  Occupational History  . Occupation: Geophysical data processor  Social Needs  . Financial resource strain: Not on file  . Food insecurity:    Worry: Not on file    Inability: Not on file  . Transportation needs:    Medical: Not on file    Non-medical: Not on file  Tobacco Use  . Smoking status: Former Smoker    Packs/day: 1.00    Years: 15.00    Pack years: 15.00    Last attempt to quit: 06/30/1979    Years since quitting: 38.6  . Smokeless tobacco: Never Used  Substance and Sexual Activity  . Alcohol use: Yes    Alcohol/week: 0.6 oz    Types: 1 Standard drinks or equivalent per week    Comment: 1 glass of red wine daily  . Drug use: No  . Sexual activity: Yes    Birth control/protection: None  Lifestyle  . Physical activity:    Days per week: Not on file    Minutes per session: Not on file  . Stress: Not on file  Relationships  . Social connections:    Talks on phone: Not on file    Gets together: Not on file    Attends religious service: Not on file    Active member of club or organization: Not on file    Attends meetings of clubs or organizations: Not on file    Relationship status: Not on file  Other Topics Concern  . Not on file  Social History Narrative   Divorced. Education:  [employer]. Exercise: No.    Her Allergies Are:  Allergies  Allergen Reactions  . No Known Allergies   :   Her Current Medications Are:  Outpatient Encounter Medications as of 02/20/2018  Medication Sig  . ALPRAZolam (XANAX) 1 MG tablet Take 1 mg by mouth at bedtime as needed for anxiety.  [name] amLODipine (NORVASC) 10 MG tablet Take 1 tablet (10 mg total) by mouth daily.  [name] buPROPion (WELLBUTRIN XL) 300 MG 24 hr tablet Take 300 mg by mouth daily.  . candesartan (ATACAND)  32 MG tablet Take 1 tablet (32 mg total) by mouth daily.  . Cholecalciferol (VITAMIN D-3) 5000 units TABS Take 5,000 Units daily by mouth. In addition to the rx high dose once weekly vitamin D of 50Ku.  . clotrimazole-betamethasone (LOTRISONE) cream Apply 1 application topically 2 (two) times daily.  . Coenzyme Q10 (CO Q-10) 300 MG CAPS Take 1 capsule daily by mouth.  . docusate sodium (COLACE) 100 MG capsule Take 1 capsule (100 mg total) by mouth 2 (two) times daily.  . Ginkgo Biloba 40 MG TABS Take 1 tablet daily by mouth.  [name] HYDROcodone-acetaminophen (NORCO) 10-325 MG tablet Take 1 tablet by mouth 3 (three) times daily.   . hydrOXYzine (ATARAX/VISTARIL) 25 MG tablet Take 2 tablets (50 mg total) by mouth every 6 (six) hours as needed for anxiety or itching.  . lamoTRIgine (LAMICTAL) 200 MG tablet Take 200 mg by mouth 2 (two) times daily.  [name] levothyroxine (SYNTHROID, LEVOTHROID) 100 MCG tablet TAKE 1 TABLET (100 MCG TOTAL) DAILY BEFORE BREAKFAST BY MOUTH.  . lurasidone (LATUDA) 40 MG TABS tablet Take 40 mg by mouth daily with breakfast.  . magnesium oxide (MAG-OX) 400 MG tablet Take 400 mg by mouth daily.  [name] olmesartan (BENICAR) 40 MG tablet Take 1 tablet (40 mg total) by mouth daily.  . Omega-3 Fatty Acids (FISH OIL) 1200 MG CAPS Take by mouth.  [name] omeprazole (PRILOSEC) 40 MG capsule TAKE 1 CAPSULE BY MOUTH EVERY DAY  . pravastatin (PRAVACHOL) 40 MG tablet Take 1 tablet (40 mg total) daily by mouth.  . traZODone (DESYREL)  100 MG tablet Take 300 mg by mouth at bedtime.   Min[place] Tosylate 80 MG CAPS Take 80 mg by mouth daily.  . Vitamin D, Ergocalciferol, (DRISDOL) 50000 units CAPS capsule Take 1 capsule (50,000 Units total) every 7 (seven) days by mouth.  . vitamin E 600 UNIT capsule Take 600 Units by mouth daily.  . [DISCONTINUED] omeprazole (PRILOSEC) 40 MG capsule TAKE 1 CAPSULE BY MOUTH EVERY DAY   Facility-Administered Encounter Medications as of 02/20/2018  Medication  . methylPREDNISolone acetate (DEPO-MEDROL) injection 40 mg  :   Review of Systems:  Out of a complete 14 point review of systems, all are reviewed and negative with the exception of these symptoms as listed below:  Review of Systems  Neurological:       Becky Lawson presents today to discuss her tremors. Becky Lawson notices the tremors in both of her hands. Becky Lawson reports that Becky Lawson has had the tremors for a while now but they seem to be progressively worsening. Becky Lawson's father had PD. Becky Lawson is left handed.    Objective:  Neurological Exam  Physical Exam Physical Examination:   Vitals:   02/20/18 1432  BP: (!) 157/91  Pulse: 75    General Examination: The Becky Lawson is a very pleasant 67 y.o. female in no acute distress. Becky Lawson appears well-developed and well-nourished and well groomed.   HEENT: Normocephalic, atraumatic, pupils are equal, round and reactive to light and accommodation. Corrective eye glasses. Becky Lawson has mild facial masking, mild difficulty with tracking, mild nuchal rigidity. Hearing is grossly intact, no significant speech difficulty, no telltale hypophonia, no voice tremor. Becky Lawson has an intermittent lower lip tremor. Becky Lawson has no oral facial dyskinesias or tongue dyskinesias. Becky Lawson has no carotid bruits, unremarkable scar from partial thyroidectomy.  Tongue protrudes centrally and palate elevates symmetrically.   Chest: Clear to auscultation without wheezing, rhonchi or crackles noted.  Heart: S1+S2+0, regular and normal without murmurs, rubs  or  gallops noted.   Abdomen: Soft, non-tender and non-distended with normal bowel sounds appreciated on auscultation.  Extremities: There is no pitting edema in the distal lower extremities bilaterally. Pedal pulses are intact.  Skin: Warm and dry without trophic changes noted.  Musculoskeletal: exam reveals status post bilateral knee replacement surgeries, right hip pain.   Neurologically:  Mental status: The Becky Lawson is awake, alert and oriented in all 4 spheres. Her immediate and remote memory, attention, language skills and fund of knowledge are appropriate. There is no evidence of aphasia, agnosia, apraxia or anomia. Speech is clear with normal prosody and enunciation. Thought process is linear. Mood is normal and affect is normal.  Cranial nerves II - XII are as described above under HEENT exam. In addition: shoulder shrug is normal with equal shoulder height noted. Motor exam: Normal bulk, strength and fairly normal tone is noted. No obvious cogwheeling is noted. Becky Lawson has no dyskinesias.   On 02/20/2018: On Archimedes spiral drawing Becky Lawson has mild to moderate insecurity with the right, minimal difficulty with the left, no telltale trembling so. Handwriting is small, legible, not tremulous per se.  Becky Lawson has a mild intermittent resting tremor in both upper extremities, mild postural tremor in both hands, minimal action tremor. On fine motor testing with finger taps, hand movements and rapid alternating patting Becky Lawson has mild difficulty bilaterally. No telltale lateralization is noted. Foot taps and foot agility are also mildly abnormal bilaterally, no significant lateralization noted. Becky Lawson stands up with mild difficulty and pushes herself up. Becky Lawson reports right hip pain. Becky Lawson walks with decreased stride length, decreased pace, mildly decreased arm swing bilaterally, may be slightly more noticeable on the right. Becky Lawson turns in 3 steps. Posture is mildly stooped for age. Romberg is negative with the exception of  initial sway. Cerebellar testing: No dysmetria or intention tremor. There is no truncal or gait ataxia.  Sensory exam: intact to light touch in the upper and lower extremities.   Assessment and Plan:   In summary, Becky Lawson is a very pleasant 67 y.o.-year old female with an underlying medical history of reflux disease, mood disorder with a diagnosis of bipolar disorder, abnormal liver function tests, lumbar spinal stenosis, hypothyroidism, obesity, sleep apnea, hypertension, hyperlipidemia, and arthritis, who presents for neurologic consultation of her tremor disorder. On examination Becky Lawson has mild features of essential tremor with a bilateral postural hand tremor but also mild parkinsonism, which could be medication induced. I do not see any telltale signs of lateralization that would point towards idiopathic Parkinson's disease but Becky Lawson does give a family history of Parkinson's disease in her father who did not have any psychiatric illness or psychotropic medications as Becky Lawson recalls. Given her long-standing history of hand tremors, Becky Lawson certainly could have essential tremor, overshadowed by mild parkinsonism. Since Becky Lawson does not give a telltale history of tardive dyskinesias and has no obvious neurologic findings were tardive dyskinesias, Becky Lawson is encouraged to talk to her psychiatry provider about potentially coming off of the [place]. Of note, Becky Lawson had a brain MRI without contrast in 2013 which did not show any acute abnormalities. I would like to proceed with a DaT scan to further help with the diagnostic dilemma of her tremor disorder. I did not suggest any new medication but did ask her to consider coming off of the [place] with the approval of her psychiatry providers and also for preparation of this scan Becky Lawson would have to be off of Wellbutrin for at least 8 days prior to  the scan and Becky Lawson would have to taper off of this, only with the approval of her psychiatry PA. I would like to follow her clinically. I  suggested a follow-up in 3-4 months, sooner if needed. We will keep her posted as to her scan results in the interim. I answered all her questions today and Becky Lawson was in agreement with the plan.  Thank you very much for allowing me to participate in the care of this nice Becky Lawson. If I can be of any further assistance to you please do not hesitate to call me at [phone].  Sincerely,   [name], MD, PhD

## 2018-02-21 ENCOUNTER — Ambulatory Visit (INDEPENDENT_AMBULATORY_CARE_PROVIDER_SITE_OTHER): Payer: PPO | Admitting: Family Medicine

## 2018-02-21 ENCOUNTER — Other Ambulatory Visit: Payer: Self-pay

## 2018-02-21 ENCOUNTER — Encounter: Payer: Self-pay | Admitting: Family Medicine

## 2018-02-21 ENCOUNTER — Telehealth: Payer: Self-pay | Admitting: Neurology

## 2018-02-21 VITALS — BP 124/80 | HR 76 | Temp 99.8°F | Resp 16 | Ht 64.0 in | Wt 184.2 lb

## 2018-02-21 DIAGNOSIS — Z79899 Other long term (current) drug therapy: Secondary | ICD-10-CM | POA: Diagnosis not present

## 2018-02-21 DIAGNOSIS — R5383 Other fatigue: Secondary | ICD-10-CM | POA: Diagnosis not present

## 2018-02-21 DIAGNOSIS — R5382 Chronic fatigue, unspecified: Secondary | ICD-10-CM

## 2018-02-21 DIAGNOSIS — G4710 Hypersomnia, unspecified: Secondary | ICD-10-CM | POA: Diagnosis not present

## 2018-02-21 DIAGNOSIS — E559 Vitamin D deficiency, unspecified: Secondary | ICD-10-CM

## 2018-02-21 DIAGNOSIS — Z5181 Encounter for therapeutic drug level monitoring: Secondary | ICD-10-CM | POA: Diagnosis not present

## 2018-02-21 DIAGNOSIS — Z1211 Encounter for screening for malignant neoplasm of colon: Secondary | ICD-10-CM

## 2018-02-21 DIAGNOSIS — R531 Weakness: Secondary | ICD-10-CM

## 2018-02-21 NOTE — Patient Instructions (Addendum)
   Discuss stopping the latuda with psychiatry  Follow up for sleep study  Complete cologuard at home and return for colon cancer screening    IF you received an x-ray today, you will receive an invoice from [facility]. Please contact [facility] at [phone] with questions or concerns regarding your invoice.   IF you received labwork today, you will receive an invoice from [facility]. Please contact [facility] at [phone] with questions or concerns regarding your invoice.   Our billing staff will not be able to assist you with questions regarding bills from these companies.  You will be contacted with the lab results as soon as they are available. The fastest way to get your results is to activate your My Chart account. Instructions are located on the last page of this paperwork. If you have not heard from [place] regarding the results in 2 weeks, please contact this office.     Colorectal Cancer Screening Colorectal cancer screening is a group of tests used to check for colorectal cancer. Colorectal refers to your colon and rectum. Your colon and rectum are located at the end of your large intestine and carry your bowel movements out of your body. Why is colorectal cancer screening done? It is common for abnormal growths (polyps) to form in the lining of your colon, especially as you get older. These polyps can be cancerous or become cancerous. If colorectal cancer is found at an early stage, it is treatable. Who should be screened for colorectal cancer? Screening is recommended for all adults at average risk starting at age 8. Tests may be recommended every 1 to 10 years. Your health care provider may recommend earlier or more frequent screening if you have:  A history of colorectal cancer or polyps.  A family member with a history of colorectal cancer or polyps.  Inflammatory bowel disease, such as ulcerative colitis or Crohn disease.  A type of hereditary colon  cancer syndrome.  Colorectal cancer symptoms.  Types of screening tests There are several types of colorectal screening tests. They include:  Guaiac-based fecal occult blood testing.  Fecal immunochemical test (FIT).  Stool DNA test.  Barium enema.  Virtual colonoscopy.  Sigmoidoscopy. During this test, a sigmoidoscope is used to examine your rectum and lower colon. A sigmoidoscope is a flexible tube with a camera that is inserted through your anus into your rectum and lower colon.  Colonoscopy. During this test, a colonoscope is used to examine your entire colon. A colonoscope is a long, thin, flexible tube with a camera. This test examines your entire colon and rectum.  This information is not intended to replace advice given to you by your health care provider. Make sure you discuss any questions you have with your health care provider. Document Released: 02/09/2010 Document Revised: 03/31/2016 Document Reviewed: 11/28/2013 Elsevier Interactive Patient Education  [employer].

## 2018-02-21 NOTE — Progress Notes (Signed)
Chief Complaint  Patient presents with  . Fatigue    x 6 mos that is getting progressively worse almost to it debilitating and on the verge of not being able to work.  Pt has hx of depression w/ fatigue pt is on medication for depression.  Hx of thyroid cancer and has had one tumor removed.    HPI  Chronic Fatigue She reports that she has a history of depression and thyroid cancer with hemithyroidectomy of the left gland now on supplementation with levothyroxine.  She reports thyroidectomy in 2018 She states that this is a chronic ongoing fatigue Lab Results  Component Value Date   TSH 1.410 07/24/2017   Depression  She has never had a sleep study She reports that her depression symptoms are present on most of the days  She is on wellbutrin and lamictal as well as latuda and trazodone She sleeps through the night and wakes up between 10:30am and 11pm She gets 12 hours at night and sleep She gets up and goes back to sleep and does not seem to be able to get up .  She reports that she has been on the trazodone for 15 to 20 years and cannot go to sleep without it She also takes alprazolam 1mg  and takes one at night every night She states that it has been six months now since she started sleeping for so long.  She reports that she was restarted on latuda six months ago  She gets samples from Psychiatry  She reports that it was previously helping her depression but now she cannot tell if anything is helping her mood anymore.  Depression screen [facility] 2/9 02/21/2018 02/21/2018 07/24/2017 07/13/2017 05/13/2017  Decreased Interest 3 0 3 3 3   Down, Depressed, Hopeless 3 0 3 3 3   PHQ - 2 Score 6 0 6 6 6   Altered sleeping 2 - 1 0 0  Tired, decreased energy 3 - 3 3 3   Change in appetite 3 - 3 3 3   Feeling bad or failure about yourself  2 - 3 3 3   Trouble concentrating 1 - 3 3 3   Moving slowly or fidgety/restless 1 - 3 0 3  Suicidal thoughts 0 - 0 0 0  PHQ-9 Score 18 - 22 18 21   Difficult doing  work/chores Extremely dIfficult - Very difficult Somewhat difficult Somewhat difficult    Colon Cancer Screening She has never had a colonoscopy She denies blood in his stool, unexpected weight loss or pain with defecation No rectal itching She does not smoke She does not have a family history of colon cancer    Past Medical History:  Diagnosis Date  . Anxiety   . Arthritis   . Benign cyst of right kidney 2014   2.7 cm right kidney cyst seen on imaging 2014 but was c/o right flank pain with new persistent proteinuria (fortunately hematuria had resolved) so repeat US 11/2016 showed right kidney still with 2.6 cm simple cyst and otherwise nml.  . Bipolar 1 disorder ([place])   . Chronic kidney disease   . Depression   . GERD (gastroesophageal reflux disease)   . Heart murmur   . 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 Hyperlipidemia   . Hypertension   . Jaundice 11/01/2012  . Pruritic disorder 02/13/2013  . Sleep apnea    Was diagnosed approximately 20 years ago, but does not wear CPAP patient stated "I gave it  back.Marland KitchenMarland KitchenI couldn't wear that thing it was horrible"  . Spinal stenosis     Current Outpatient Medications  Medication Sig Dispense Refill  . ALPRAZolam (XANAX) 1 MG tablet Take 1 mg by mouth at bedtime as needed for anxiety.    [name] amLODipine (NORVASC) 10 MG tablet Take 1 tablet (10 mg total) by mouth daily. 90 tablet 3  . buPROPion (WELLBUTRIN XL) 300 MG 24 hr tablet Take 300 mg by mouth daily.  0  . candesartan (ATACAND) 32 MG tablet Take 1 tablet (32 mg total) by mouth daily. 90 tablet 1  . Cholecalciferol (VITAMIN D-3) 5000 units TABS Take 5,000 Units daily by mouth. In addition to the rx high dose once weekly vitamin D of 50Ku. 30 tablet   . clotrimazole-betamethasone (LOTRISONE) cream Apply 1 application topically 2 (two) times daily. 15 g 0  . Coenzyme Q10 (CO Q-10) 300 MG CAPS Take 1 capsule daily by mouth.    .  docusate sodium (COLACE) 100 MG capsule Take 1 capsule (100 mg total) by mouth 2 (two) times daily. 10 capsule 0  . Ginkgo Biloba 40 MG TABS Take 1 tablet daily by mouth.    [name] HYDROcodone-acetaminophen (NORCO) 10-325 MG tablet Take 1 tablet by mouth 3 (three) times daily.     . hydrOXYzine (ATARAX/VISTARIL) 25 MG tablet Take 2 tablets (50 mg total) by mouth every 6 (six) hours as needed for anxiety or itching. 240 tablet 0  . lamoTRIgine (LAMICTAL) 200 MG tablet Take 200 mg by mouth 2 (two) times daily.    [name] levothyroxine (SYNTHROID, LEVOTHROID) 100 MCG tablet TAKE 1 TABLET (100 MCG TOTAL) DAILY BEFORE BREAKFAST BY MOUTH. 90 tablet 0  . lurasidone (LATUDA) 40 MG TABS tablet Take 40 mg by mouth daily with breakfast.    . magnesium oxide (MAG-OX) 400 MG tablet Take 400 mg by mouth daily.    [name] olmesartan (BENICAR) 40 MG tablet Take 1 tablet (40 mg total) by mouth daily. 90 tablet 1  . Omega-3 Fatty Acids (FISH OIL) 1200 MG CAPS Take by mouth.    [name] omeprazole (PRILOSEC) 40 MG capsule TAKE 1 CAPSULE BY MOUTH EVERY DAY 90 capsule 0  . pravastatin (PRAVACHOL) 40 MG tablet Take 1 tablet (40 mg total) daily by mouth. 90 tablet 3  . traZODone (DESYREL) 100 MG tablet Take 300 mg by mouth at bedtime.     Minus Liberty Tosylate 80 MG CAPS Take 80 mg by mouth daily.    . Vitamin D, Ergocalciferol, (DRISDOL) 50000 units CAPS capsule Take 1 capsule (50,000 Units total) every 7 (seven) days by mouth. 30 capsule 0  . vitamin E 600 UNIT capsule Take 600 Units by mouth daily.     Current Facility-Administered Medications  Medication Dose Route Frequency Provider Last Rate Last Dose  . methylPREDNISolone acetate (DEPO-MEDROL) injection 40 mg  40 mg Intramuscular Once Shawnee Knapp, MD        Allergies:  Allergies  Allergen Reactions  . No Known Allergies     Past Surgical History:  Procedure Laterality Date  . ABDOMINAL HYSTERECTOMY    . COLONOSCOPY W/ POLYPECTOMY    . JOINT REPLACEMENT Bilateral    knee    . KNEE ARTHROSCOPY Bilateral   . PARTIAL HYSTERECTOMY    . THYROID LOBECTOMY Left 06/16/2016  . THYROID LOBECTOMY Left 06/16/2016   Procedure: LEFT THYROID LOBECTOMY;  Surgeon: Greer Pickerel, MD;  Location: [facility];  Service: General;  Laterality: Left;    Social History  Socioeconomic History  . Marital status: Divorced    Spouse name: Not on file  . Number of children: Not on file  . Years of education: Not on file  . Highest education level: Not on file  Occupational History  . Occupation: Geophysical data processor  Social Needs  . Financial resource strain: Not on file  . Food insecurity:    Worry: Not on file    Inability: Not on file  . Transportation needs:    Medical: Not on file    Non-medical: Not on file  Tobacco Use  . Smoking status: Former Smoker    Packs/day: 1.00    Years: 15.00    Pack years: 15.00    Last attempt to quit: 06/30/1979    Years since quitting: 38.6  . Smokeless tobacco: Never Used  Substance and Sexual Activity  . Alcohol use: Yes    Alcohol/week: 0.6 oz    Types: 1 Standard drinks or equivalent per week    Comment: 1 glass of red wine daily  . Drug use: No  . Sexual activity: Yes    Birth control/protection: None  Lifestyle  . Physical activity:    Days per week: Not on file    Minutes per session: Not on file  . Stress: Not on file  Relationships  . Social connections:    Talks on phone: Not on file    Gets together: Not on file    Attends religious service: Not on file    Active member of club or organization: Not on file    Attends meetings of clubs or organizations: Not on file    Relationship status: Not on file  Other Topics Concern  . Not on file  Social History Narrative   Divorced. Education: [employer]. Exercise: No.    Family History  Problem Relation Age of Onset  . Diabetes Mother   . Heart disease Mother   . Hyperlipidemia Mother   . Hypertension Mother   . Stroke Mother   . Hypertension Father   . Parkinson's  disease Father   . Heart disease Father   . Hyperlipidemia Father   . Hemachromatosis Daughter   . Thyroid disease Neg Hx      ROS Review of Systems See HPI Constitution: No fevers or chills No malaise No diaphoresis Skin: No rash or itching Eyes: no blurry vision, no double vision GU: no dysuria or hematuria Neuro: no dizziness or headaches all others reviewed and negative   Objective: Vitals:   02/21/18 1511  BP: 124/80  Pulse: 76  Resp: 16  Temp: 99.8 F (37.7 C)  TempSrc: Oral  SpO2: 96%  Weight: 184 lb 3.2 oz (83.6 kg)  Height: 5\' 4"  (1.626 m)    Physical Exam Physical Exam  Constitutional: She is oriented to person, place, and time. She appears well-developed and well-nourished.  HENT:  Head: Normocephalic and atraumatic.  Eyes: Conjunctivae and EOM are normal.  Cardiovascular: Normal rate, regular rhythm and normal heart sounds.   Pulmonary/Chest: Effort normal and breath sounds normal. No respiratory distress. She has no wheezes.  Abdominal: Normal appearance and bowel sounds are normal. There is no tenderness. There is no CVA tenderness.  Neurological: She is alert and oriented to person, place, and time.    Assessment and Plan Ivan was seen today for fatigue.  Diagnoses and all orders for this visit:  Hypersomnia- discussed sleep patterns and medications Currently multifactorial -     Ambulatory referral to Sleep Studies  Encounter for medication monitoring -     Hemoglobin A1c -     TSH + free T4 -     Comprehensive metabolic panel -     Vitamin B12  Screening for colon cancer- last cscopy >10 yrs ago  Pt had a polyp removed No current symptoms -     Cologuard  Fatigue, unspecified type  Weakness generalized -     Ambulatory referral to Sleep Studies  Vitamin D deficiency- will reassess, pt was on 50k UNITS -     VITAMIN D 25 Hydroxy (Vit-D Deficiency, Fractures)  Chronic fatigue- Would like to get a sleep study to evaluate if  there are any underlying causes If pt has apnea she should not be on benzos -     Ambulatory referral to Sleep Studies  Polypharmacy -  Her psych meds have overlapping side effects Discussed xanax, trazodone, latuda, lamictal, wellbutrin and hydroxyzine She will talk to psychiatry about discontinuing latuda   A total of 40 minutes were spent face-to-face with the patient during this encounter and over half of that time was spent on counseling and coordination of care.  -  Will have pt follow up with Psychiatry to discuss med changes -  disucssed further follow up and labs -  Spent time talking about her multiple meds with drowsiness  Vitaliy Eisenhour A Nolon Rod

## 2018-02-21 NOTE — Telephone Encounter (Signed)
DAT Scan pending . Patient aware of process.

## 2018-02-22 LAB — COMPREHENSIVE METABOLIC PANEL
ALBUMIN: 4.5 g/dL (ref 3.6–4.8)
ALT: 7 IU/L (ref 0–32)
AST: 11 IU/L (ref 0–40)
Albumin/Globulin Ratio: 2.4 — ABNORMAL HIGH (ref 1.2–2.2)
Alkaline Phosphatase: 59 IU/L (ref 39–117)
BUN / CREAT RATIO: 11 — AB (ref 12–28)
BUN: 10 mg/dL (ref 8–27)
Bilirubin Total: 0.3 mg/dL (ref 0.0–1.2)
CALCIUM: 9.5 mg/dL (ref 8.7–10.3)
CO2: 23 mmol/L (ref 20–29)
CREATININE: 0.91 mg/dL (ref 0.57–1.00)
Chloride: 102 mmol/L (ref 96–106)
GFR calc Af Amer: 76 mL/min/{1.73_m2} (ref >59)
GFR, EST NON AFRICAN AMERICAN: 65 mL/min/{1.73_m2} (ref >59)
GLOBULIN, TOTAL: 1.9 g/dL (ref 1.5–4.5)
GLUCOSE: 96 mg/dL (ref 65–99)
Potassium: 4.7 mmol/L (ref 3.5–5.2)
SODIUM: 139 mmol/L (ref 134–144)
Total Protein: 6.4 g/dL (ref 6.0–8.5)

## 2018-02-22 LAB — TSH+FREE T4
Free T4: 1.61 ng/dL (ref 0.82–1.77)
TSH: 1.21 u[IU]/mL (ref 0.450–4.500)

## 2018-02-22 LAB — HEMOGLOBIN A1C
Est. average glucose Bld gHb Est-mCnc: 97 mg/dL
Hgb A1c MFr Bld: 5 % (ref 4.8–5.6)

## 2018-02-22 LAB — VITAMIN D 25 HYDROXY (VIT D DEFICIENCY, FRACTURES): VIT D 25 HYDROXY: 18.8 ng/mL — AB (ref 30.0–100.0)

## 2018-02-22 LAB — VITAMIN B12: Vitamin B-12: 338 pg/mL (ref 232–1245)

## 2018-02-28 DIAGNOSIS — M25551 Pain in right hip: Secondary | ICD-10-CM | POA: Diagnosis not present

## 2018-03-01 DIAGNOSIS — F319 Bipolar disorder, unspecified: Secondary | ICD-10-CM | POA: Diagnosis not present

## 2018-03-05 DIAGNOSIS — Z1211 Encounter for screening for malignant neoplasm of colon: Secondary | ICD-10-CM | POA: Diagnosis not present

## 2018-03-05 NOTE — Telephone Encounter (Signed)
Approval reference or DAT Scan - [id number] Spoke with Becky Lawson.

## 2018-03-05 NOTE — Telephone Encounter (Signed)
Patient has been approved and scheduled

## 2018-03-13 DIAGNOSIS — M25551 Pain in right hip: Secondary | ICD-10-CM | POA: Diagnosis not present

## 2018-03-13 NOTE — Telephone Encounter (Signed)
FYI Pt calling to make RN aware that she did not have the DAT scan because she is claustrophobic  And would have to be knocked out to tolerate the scan.  Pt has not requested a call back.  She just wanted to make it known why she could not do the scan.

## 2018-03-22 ENCOUNTER — Other Ambulatory Visit (HOSPITAL_COMMUNITY): Payer: PPO

## 2018-03-22 ENCOUNTER — Encounter (HOSPITAL_COMMUNITY): Payer: PPO

## 2018-03-22 DIAGNOSIS — F319 Bipolar disorder, unspecified: Secondary | ICD-10-CM | POA: Diagnosis not present

## 2018-03-24 ENCOUNTER — Encounter: Payer: Self-pay | Admitting: Family Medicine

## 2018-03-24 ENCOUNTER — Ambulatory Visit (INDEPENDENT_AMBULATORY_CARE_PROVIDER_SITE_OTHER): Payer: PPO | Admitting: Family Medicine

## 2018-03-24 ENCOUNTER — Other Ambulatory Visit: Payer: Self-pay

## 2018-03-24 VITALS — BP 118/68 | HR 84 | Temp 98.5°F | Resp 16 | Ht 63.58 in | Wt 186.4 lb

## 2018-03-24 DIAGNOSIS — G4733 Obstructive sleep apnea (adult) (pediatric): Secondary | ICD-10-CM

## 2018-03-24 DIAGNOSIS — K219 Gastro-esophageal reflux disease without esophagitis: Secondary | ICD-10-CM

## 2018-03-24 DIAGNOSIS — E559 Vitamin D deficiency, unspecified: Secondary | ICD-10-CM | POA: Diagnosis not present

## 2018-03-24 DIAGNOSIS — E041 Nontoxic single thyroid nodule: Secondary | ICD-10-CM

## 2018-03-24 DIAGNOSIS — I10 Essential (primary) hypertension: Secondary | ICD-10-CM | POA: Diagnosis not present

## 2018-03-24 DIAGNOSIS — R5382 Chronic fatigue, unspecified: Secondary | ICD-10-CM | POA: Diagnosis not present

## 2018-03-24 DIAGNOSIS — E890 Postprocedural hypothyroidism: Secondary | ICD-10-CM | POA: Diagnosis not present

## 2018-03-24 DIAGNOSIS — F3177 Bipolar disorder, in partial remission, most recent episode mixed: Secondary | ICD-10-CM | POA: Diagnosis not present

## 2018-03-24 DIAGNOSIS — K760 Fatty (change of) liver, not elsewhere classified: Secondary | ICD-10-CM | POA: Diagnosis not present

## 2018-03-24 DIAGNOSIS — E7800 Pure hypercholesterolemia, unspecified: Secondary | ICD-10-CM

## 2018-03-24 MED ORDER — LEVOTHYROXINE SODIUM 100 MCG PO TABS: 100.0000 ug | ORAL_TABLET | Freq: Every day | ORAL | 1 refills | Status: DC

## 2018-03-24 MED ORDER — OMEPRAZOLE 40 MG PO CPDR: DELAYED_RELEASE_CAPSULE | ORAL | 0 refills | Status: DC

## 2018-03-24 MED ORDER — CANDESARTAN CILEXETIL 32 MG PO TABS: 32.0000 mg | ORAL_TABLET | Freq: Every day | ORAL | 1 refills | Status: DC

## 2018-03-24 MED ORDER — VITAMIN D (ERGOCALCIFEROL) 1.25 MG (50000 UNIT) PO CAPS: 50000.0000 [IU] | ORAL_CAPSULE | ORAL | 1 refills | Status: DC

## 2018-03-24 NOTE — Progress Notes (Signed)
Subjective:    Patient ID: Becky Lawson, female    DOB: May 18, 1951, 67 y.o.   MRN: [id number] Chief Complaint  Patient presents with  . Fatigue    pt state she still feel fatigue and having some weakness     HPI  Becky Lawson is a delightful 67 yo woman  Hypothyroidism: S/p left hemithyroidectomy due to thyroid cancer in 2018 now on thyroid supplementation.  Chronic fatigue:  She was referred for a sleep study by Dr. Nolon Rod last mo and her psychiatrist also thinks she needs a sleep study. However, she is on numerous sedating side effects. Whenever she is not at work then she is in her recliner or bed sleeping - she only gets up to shower, eat, and go to the bathroom. 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  She has not heard about the sleep study yet.  She has been told that she stored and stopped breathing overnight - her snoring was so loud several people could sleep in the same room with her on a mission trip a while back. She was diagnosed with OSA >5 yrs ago but she couldn't tolerate the CPAP - it was a very large mask that covered her mouth and nose - so now she would be interested in trying a new mask.  HTN: Notes her BP has been running quite low.   Mood d/o/Bipolar depression:  She was ordered to have a cologuard for colonoscopy   Past Medical History:  Diagnosis Date  . Anxiety   . Arthritis   . Benign cyst of right kidney 2014   2.7 cm right kidney cyst seen on imaging 2014 but was c/o right flank pain with new persistent proteinuria (fortunately hematuria had resolved) so repeat US 11/2016 showed right kidney still with 2.6 cm simple cyst and otherwise nml.  . Bipolar 1 disorder ([place])   . Chronic kidney disease   . Depression   . GERD (gastroesophageal reflux disease)   . Heart murmur   . Hepatitis C    resolved completely after treatment in 2014, genotype 1b,  followed at [facility]  . Hyperlipidemia   . Hypertension   . Jaundice 11/01/2012  . Pruritic disorder 02/13/2013  . Sleep apnea    Was diagnosed approximately 20 years ago, but does not wear CPAP patient stated "I gave it back.Marland KitchenMarland KitchenI couldn't wear that thing it was horrible"  . Spinal stenosis    Past Surgical History:  Procedure Laterality Date  . ABDOMINAL HYSTERECTOMY    . COLONOSCOPY W/ POLYPECTOMY    . JOINT REPLACEMENT Bilateral    knee  . KNEE ARTHROSCOPY Bilateral   . PARTIAL HYSTERECTOMY    . THYROID LOBECTOMY Left 06/16/2016  . THYROID LOBECTOMY Left 06/16/2016   Procedure: LEFT THYROID LOBECTOMY;  Surgeon: Greer Pickerel, MD;  Location: [facility];  Service: General;  Laterality: Left;   Current Outpatient Medications on File Prior to Visit  Medication Sig Dispense Refill  . ALPRAZolam (XANAX) 1 MG tablet Take 1 mg by mouth at bedtime as needed for anxiety.    [name] amLODipine (NORVASC) 10 MG tablet Take 1 tablet (10 mg total) by mouth daily. 90 tablet 3  . buPROPion (WELLBUTRIN XL) 300 MG 24 hr tablet Take 300 mg by mouth daily.  0  . candesartan (ATACAND) 32 MG tablet Take 1 tablet (32 mg total) by mouth daily.  90 tablet 1  . Cholecalciferol (VITAMIN D-3) 5000 units TABS Take 5,000 Units daily by mouth. In addition to the rx high dose once weekly vitamin D of 50Ku. 30 tablet   . clotrimazole-betamethasone (LOTRISONE) cream Apply 1 application topically 2 (two) times daily. 15 g 0  . Coenzyme Q10 (CO Q-10) 300 MG CAPS Take 1 capsule daily by mouth.    . docusate sodium (COLACE) 100 MG capsule Take 1 capsule (100 mg total) by mouth 2 (two) times daily. 10 capsule 0  . Ginkgo Biloba 40 MG TABS Take 1 tablet daily by mouth.    [name] HYDROcodone-acetaminophen (NORCO) 10-325 MG tablet Take 1 tablet by mouth 3 (three) times daily.     . hydrOXYzine (ATARAX/VISTARIL) 25 MG tablet Take 2 tablets (50 mg total) by mouth every 6 (six) hours as needed for anxiety or itching. 240 tablet 0  . lamoTRIgine  (LAMICTAL) 200 MG tablet Take 200 mg by mouth 2 (two) times daily.    [name] levothyroxine (SYNTHROID, LEVOTHROID) 100 MCG tablet TAKE 1 TABLET (100 MCG TOTAL) DAILY BEFORE BREAKFAST BY MOUTH. 90 tablet 0  . magnesium oxide (MAG-OX) 400 MG tablet Take 400 mg by mouth daily.    [name] olmesartan (BENICAR) 40 MG tablet Take 1 tablet (40 mg total) by mouth daily. 90 tablet 1  . Omega-3 Fatty Acids (FISH OIL) 1200 MG CAPS Take by mouth.    [name] omeprazole (PRILOSEC) 40 MG capsule TAKE 1 CAPSULE BY MOUTH EVERY DAY 90 capsule 0  . pravastatin (PRAVACHOL) 40 MG tablet Take 1 tablet (40 mg total) daily by mouth. 90 tablet 3  . traZODone (DESYREL) 100 MG tablet Take 300 mg by mouth at bedtime.     Minus Liberty Tosylate 80 MG CAPS Take 80 mg by mouth daily.    . Vitamin D, Ergocalciferol, (DRISDOL) 50000 units CAPS capsule Take 1 capsule (50,000 Units total) every 7 (seven) days by mouth. 30 capsule 0  . vitamin E 600 UNIT capsule Take 600 Units by mouth daily.     Current Facility-Administered Medications on File Prior to Visit  Medication Dose Route Frequency Provider Last Rate Last Dose  . methylPREDNISolone acetate (DEPO-MEDROL) injection 40 mg  40 mg Intramuscular Once Shawnee Knapp, MD       Allergies  Allergen Reactions  . No Known Allergies    Family History  Problem Relation Age of Onset  . Diabetes Mother   . Heart disease Mother   . Hyperlipidemia Mother   . Hypertension Mother   . Stroke Mother   . Hypertension Father   . Parkinson's disease Father   . Heart disease Father   . Hyperlipidemia Father   . Hemachromatosis Daughter   . Thyroid disease Neg Hx    Social History   Socioeconomic History  . Marital status: Divorced    Spouse name: Not on file  . Number of children: Not on file  . Years of education: Not on file  . Highest education level: Not on file  Occupational History  . Occupation: Geophysical data processor  Social Needs  . Financial resource strain: Not on file  . Food  insecurity:    Worry: Not on file    Inability: Not on file  . Transportation needs:    Medical: Not on file    Non-medical: Not on file  Tobacco Use  . Smoking status: Former Smoker    Packs/day: 1.00    Years: 15.00    Pack years: 15.00  Last attempt to quit: 06/30/1979    Years since quitting: 38.7  . Smokeless tobacco: Never Used  Substance and Sexual Activity  . Alcohol use: Yes    Alcohol/week: 0.6 oz    Types: 1 Standard drinks or equivalent per week    Comment: 1 glass of red wine daily  . Drug use: No  . Sexual activity: Yes    Birth control/protection: None  Lifestyle  . Physical activity:    Days per week: Not on file    Minutes per session: Not on file  . Stress: Not on file  Relationships  . Social connections:    Talks on phone: Not on file    Gets together: Not on file    Attends religious service: Not on file    Active member of club or organization: Not on file    Attends meetings of clubs or organizations: Not on file    Relationship status: Not on file  Other Topics Concern  . Not on file  Social History Narrative   Divorced. Education: [employer]. Exercise: No.   Depression screen [facility] 2/9 03/24/2018 02/21/2018 02/21/2018 07/24/2017 07/13/2017  Decreased Interest 3 3 0 3 3  Down, Depressed, Hopeless 3 3 0 3 3  PHQ - 2 Score 6 6 0 6 6  Altered sleeping 3 2 - 1 0  Tired, decreased energy 3 3 - 3 3  Change in appetite 0 3 - 3 3  Feeling bad or failure about yourself  2 2 - 3 3  Trouble concentrating 0 1 - 3 3  Moving slowly or fidgety/restless 0 1 - 3 0  Suicidal thoughts 0 0 - 0 0  PHQ-9 Score 14 18 - 22 18  Difficult doing work/chores - Extremely dIfficult - Very difficult Somewhat difficult  Some recent data might be hidden       Review of Systems     Objective:   Physical Exam  Constitutional: She is oriented to person, place, and time. She appears well-developed and well-nourished. No distress.  HENT:  Head: Normocephalic and  atraumatic.  Right Ear: External ear normal.  Left Ear: External ear normal.  Eyes: Conjunctivae are normal. No scleral icterus.  Neck: Normal range of motion. Neck supple. No thyroid mass (left lobe surgically removed) and no thyromegaly present.  Cardiovascular: Normal rate, regular rhythm and intact distal pulses.  Murmur (LUSB) heard.  Decrescendo systolic murmur is present with a grade of 2/6. Pulmonary/Chest: Effort normal and breath sounds normal. No respiratory distress.  Musculoskeletal: She exhibits no edema.  Lymphadenopathy:    She has no cervical adenopathy.  Neurological: She is alert and oriented to person, place, and time.  Skin: Skin is warm and dry. She is not diaphoretic. No erythema.  Psychiatric: Her behavior is normal. Her affect is blunt. She is attentive.   BP 118/68   Pulse 84   Temp 98.5 F (36.9 C) (Oral)   Resp 16   Ht 5' 3.58" (1.615 m)   Wt 186 lb 6.4 oz (84.6 kg)   SpO2 93%   BMI 32.42 kg/m      Assessment & Plan:   1. Obstructive sleep apnea   2. Chronic fatigue   3. Vitamin D deficiency   4. Essential hypertension   5. Post-surgical hypothyroidism   6. Fatty liver   7. Pure hypercholesterolemia   8. Bipolar disorder, in partial remission, most recent episode mixed ([place])    Ok to refil candesartan x 6 mos when  requested in 2 mos - or taking if olmesartan instead Refilled omeprazole and levothyroxine x 6 mos  Orders Placed This Encounter  Procedures  . Split night study    Standing Status:   Future    Number of Occurrences:   1    Standing Expiration Date:   03/25/2019    Order Specific Question:   Where should this test be performed:    Answer:   [facility] ordered this encounter  Medications  . Vitamin D, Ergocalciferol, (DRISDOL) 50000 units CAPS capsule    Sig: Take 1 capsule (50,000 Units total) by mouth 2 (two) times a week.    Dispense:  26 capsule    Refill:  1  . levothyroxine (SYNTHROID,  LEVOTHROID) 100 MCG tablet    Sig: Take 1 tablet (100 mcg total) by mouth daily before breakfast.    Dispense:  90 tablet    Refill:  1  . candesartan (ATACAND) 32 MG tablet    Sig: Take 1 tablet (32 mg total) by mouth daily.    Dispense:  90 tablet    Refill:  1    To replace the back-ordered olmesartan 40 - do NOT dispense both.  . omeprazole (PRILOSEC) 40 MG capsule    Sig: TAKE 1 CAPSULE BY MOUTH EVERY DAY as needed for indigestion or heartburn    Dispense:  90 capsule    Refill:  0   [name], M.D.  Primary Care at [facility] [address] [place],  [address] [phone] phone [phone] fax  03/24/18 3:10 PM

## 2018-03-24 NOTE — Patient Instructions (Signed)
     IF you received an x-ray today, you will receive an invoice from [facility]. Please contact [facility] at [phone] with questions or concerns regarding your invoice.   IF you received labwork today, you will receive an invoice from [facility]. Please contact [facility] at [phone] with questions or concerns regarding your invoice.   Our billing staff will not be able to assist you with questions regarding bills from these companies.  You will be contacted with the lab results as soon as they are available. The fastest way to get your results is to activate your My Chart account. Instructions are located on the last page of this paperwork. If you have not heard from us regarding the results in 2 weeks, please contact this office.     

## 2018-04-06 LAB — COLOGUARD: COLOGUARD: NEGATIVE

## 2018-04-19 ENCOUNTER — Ambulatory Visit: Payer: PPO | Admitting: Family Medicine

## 2018-04-19 DIAGNOSIS — F319 Bipolar disorder, unspecified: Secondary | ICD-10-CM | POA: Diagnosis not present

## 2018-04-24 ENCOUNTER — Institutional Professional Consult (permissible substitution): Payer: PPO | Admitting: Neurology

## 2018-05-15 DIAGNOSIS — M25551 Pain in right hip: Secondary | ICD-10-CM | POA: Diagnosis not present

## 2018-05-15 DIAGNOSIS — G894 Chronic pain syndrome: Secondary | ICD-10-CM | POA: Diagnosis not present

## 2018-05-15 DIAGNOSIS — Z79891 Long term (current) use of opiate analgesic: Secondary | ICD-10-CM | POA: Diagnosis not present

## 2018-05-18 ENCOUNTER — Ambulatory Visit: Payer: PPO | Admitting: Family Medicine

## 2018-06-02 ENCOUNTER — Ambulatory Visit (HOSPITAL_BASED_OUTPATIENT_CLINIC_OR_DEPARTMENT_OTHER): Payer: PPO | Attending: Family Medicine | Admitting: Internal Medicine

## 2018-06-02 VITALS — Ht 64.0 in | Wt 184.0 lb

## 2018-06-02 DIAGNOSIS — G4733 Obstructive sleep apnea (adult) (pediatric): Secondary | ICD-10-CM

## 2018-06-02 DIAGNOSIS — G4736 Sleep related hypoventilation in conditions classified elsewhere: Secondary | ICD-10-CM | POA: Insufficient documentation

## 2018-06-08 ENCOUNTER — Ambulatory Visit: Payer: PPO | Admitting: Family Medicine

## 2018-06-12 ENCOUNTER — Institutional Professional Consult (permissible substitution): Payer: PPO | Admitting: Neurology

## 2018-06-16 DIAGNOSIS — G4733 Obstructive sleep apnea (adult) (pediatric): Secondary | ICD-10-CM

## 2018-06-16 NOTE — Procedures (Signed)
Patient Name: Becky Lawson, Becky Lawson Date: 06/02/2018 Gender: Female D.O.B: Oct 05, 1950 Age (years): 67 Referring Provider: Shawnee Knapp Height (inches): 84 Interpreting Physician: Baird Lyons MD, ABSM Weight (lbs): 184 RPSGT: Earney Hamburg BMI: 32 MRN: [id number] Neck Size: 15.00  CLINICAL INFORMATION Sleep Study Type: NPSG Indication for sleep study: OSA Epworth Sleepiness Score: 14  SLEEP STUDY TECHNIQUE As per the AASM Manual for the Scoring of Sleep and Associated Events v2.3 (April 2016) with a hypopnea requiring 4% desaturations.  The channels recorded and monitored were frontal, central and occipital EEG, electrooculogram (EOG), submentalis EMG (chin), nasal and oral airflow, thoracic and abdominal wall motion, anterior tibialis EMG, snore microphone, electrocardiogram, and pulse oximetry.  MEDICATIONS Medications self-administered by patient taken the night of the study : TRAZADON, QUITIESINE, AMLODIPINE, XANAX, LAMICTAL  SLEEP ARCHITECTURE The study was initiated at 10:22:49 PM and ended at 4:36:17 AM.  Sleep onset time was 45.9 minutes and the sleep efficiency was 78.6%%. The total sleep time was 293.6 minutes.  Stage REM latency was N/A minutes.  The patient spent 1.2%% of the night in stage N1 sleep, 98.6%% in stage N2 sleep, 0.2%% in stage N3 and 0% in REM.  Alpha intrusion was absent.  Supine sleep was 13.02%.  RESPIRATORY PARAMETERS The overall apnea/hypopnea index (AHI) was 10.4 per hour. There were 12 total apneas, including 12 obstructive, 0 central and 0 mixed apneas. There were 39 hypopneas and 33 RERAs.  The AHI during Stage REM sleep was N/A per hour.  AHI while supine was 50.2 per hour.  The mean oxygen saturation was 90.6%. The minimum SpO2 during sleep was 83.0%.  moderate snoring was noted during this study.  CARDIAC DATA The 2 lead EKG demonstrated sinus rhythm. The mean heart rate was 69.0 beats per minute. Other EKG findings include:  None.  LEG MOVEMENT DATA The total PLMS were 0 with a resulting PLMS index of 0.0. Associated arousal with leg movement index was 0.0 .  IMPRESSIONS - Mild obstructive sleep apnea occurred during this study (AHI = 10.4/h). - No significant central sleep apnea occurred during this study (CAI = 0.0/h). - Mild oxygen desaturation was noted during this study (Min O2 = 83.0%). Mean 90.6%. - The patient snored with moderate snoring volume. - No cardiac abnormalities were noted during this study. - Clinically significant periodic limb movements did not occur during sleep. No significant associated arousals. - Patient was "so nervous she was shaking" on arrival according to tech.  DIAGNOSIS - Obstructive Sleep Apnea (327.23 [G47.33 ICD-10]) - Nocturnal Hypoxemia (327.26 [G47.36 ICD-10])  RECOMMENDATIONS - Treatment for mild OSA is directed at symptoms, recognizing daytime carry-over of sedating meds may contribute to daytime somnolence. Conservative measures might include observation, weight loss, and sleep position off back. Other options, including CPAP with careful desensitization to mask and pressure, a fitted oral appliance, or ENT evaluation, might be appropriate. - Be careful with alcohol, sedatives and other CNS depressants that may worsen sleep apnea and disrupt normal sleep architecture. - Sleep hygiene should be reviewed to assess factors that may improve sleep quality. - Weight management and regular exercise should be initiated or continued if appropriate.  [Electronically signed] 06/16/2018 12:34 PM  Baird Lyons MD, [place], American Board of Sleep Medicine   NPI: [id number]                          [place], [place] [facility]  ELECTRONICALLY SIGNED ON:  06/16/2018, 12:28 PM [place] PH: [phone]   FX: [phone] [place]

## 2018-06-21 ENCOUNTER — Telehealth: Payer: Self-pay | Admitting: Family Medicine

## 2018-06-21 NOTE — Telephone Encounter (Signed)
Called and spoke with pt. We were able to move her appt from 3:40 to 3:30 on 07/12/18 with Dr. Brigitte Pulse. I advised of time, building and late policy.

## 2018-06-21 NOTE — Telephone Encounter (Signed)
Copied from [place] [id number]. Topic: Quick Communication - Other Results (Clinic Use ONLY) >> Jun 21, 2018  3:04 PM Cecelia Byars, NT wrote: Patient is calling in regards to her results from her sleep study,  please call her to discuss she is unable to sleep and is very concerned  , Please call her at  [phone]

## 2018-06-24 NOTE — Telephone Encounter (Signed)
Pt message sent to Dr. Brigitte Pulse Pt req results of sleep study

## 2018-06-25 ENCOUNTER — Ambulatory Visit: Payer: PPO | Admitting: Neurology

## 2018-06-26 ENCOUNTER — Other Ambulatory Visit: Payer: Self-pay | Admitting: Psychiatry

## 2018-07-03 ENCOUNTER — Ambulatory Visit: Payer: Self-pay | Admitting: Psychiatry

## 2018-07-09 ENCOUNTER — Encounter: Payer: Self-pay | Admitting: Family Medicine

## 2018-07-09 NOTE — Telephone Encounter (Signed)
Will discuss w/ pt at her upcoming visit 11/7

## 2018-07-12 ENCOUNTER — Ambulatory Visit (INDEPENDENT_AMBULATORY_CARE_PROVIDER_SITE_OTHER): Payer: PPO | Admitting: Family Medicine

## 2018-07-12 ENCOUNTER — Other Ambulatory Visit: Payer: Self-pay

## 2018-07-12 ENCOUNTER — Encounter: Payer: Self-pay | Admitting: Family Medicine

## 2018-07-12 VITALS — BP 130/73 | HR 84 | Temp 99.0°F | Resp 13 | Ht 63.39 in | Wt 180.0 lb

## 2018-07-12 DIAGNOSIS — Z23 Encounter for immunization: Secondary | ICD-10-CM | POA: Diagnosis not present

## 2018-07-12 DIAGNOSIS — R634 Abnormal weight loss: Secondary | ICD-10-CM

## 2018-07-12 DIAGNOSIS — E538 Deficiency of other specified B group vitamins: Secondary | ICD-10-CM | POA: Diagnosis not present

## 2018-07-12 DIAGNOSIS — Z136 Encounter for screening for cardiovascular disorders: Secondary | ICD-10-CM | POA: Diagnosis not present

## 2018-07-12 DIAGNOSIS — Z1212 Encounter for screening for malignant neoplasm of rectum: Secondary | ICD-10-CM | POA: Diagnosis not present

## 2018-07-12 DIAGNOSIS — R251 Tremor, unspecified: Secondary | ICD-10-CM | POA: Diagnosis not present

## 2018-07-12 DIAGNOSIS — Z79899 Other long term (current) drug therapy: Secondary | ICD-10-CM | POA: Diagnosis not present

## 2018-07-12 DIAGNOSIS — Z1211 Encounter for screening for malignant neoplasm of colon: Secondary | ICD-10-CM

## 2018-07-12 DIAGNOSIS — E890 Postprocedural hypothyroidism: Secondary | ICD-10-CM

## 2018-07-12 DIAGNOSIS — I10 Essential (primary) hypertension: Secondary | ICD-10-CM | POA: Diagnosis not present

## 2018-07-12 DIAGNOSIS — R4584 Anhedonia: Secondary | ICD-10-CM

## 2018-07-12 DIAGNOSIS — R252 Cramp and spasm: Secondary | ICD-10-CM | POA: Diagnosis not present

## 2018-07-12 DIAGNOSIS — G4733 Obstructive sleep apnea (adult) (pediatric): Secondary | ICD-10-CM | POA: Diagnosis not present

## 2018-07-12 DIAGNOSIS — K760 Fatty (change of) liver, not elsewhere classified: Secondary | ICD-10-CM

## 2018-07-12 DIAGNOSIS — Z1383 Encounter for screening for respiratory disorder NEC: Secondary | ICD-10-CM | POA: Diagnosis not present

## 2018-07-12 DIAGNOSIS — K219 Gastro-esophageal reflux disease without esophagitis: Secondary | ICD-10-CM | POA: Diagnosis not present

## 2018-07-12 DIAGNOSIS — Z0001 Encounter for general adult medical examination with abnormal findings: Secondary | ICD-10-CM | POA: Diagnosis not present

## 2018-07-12 DIAGNOSIS — Z1389 Encounter for screening for other disorder: Secondary | ICD-10-CM | POA: Diagnosis not present

## 2018-07-12 DIAGNOSIS — Z1231 Encounter for screening mammogram for malignant neoplasm of breast: Secondary | ICD-10-CM

## 2018-07-12 DIAGNOSIS — L929 Granulomatous disorder of the skin and subcutaneous tissue, unspecified: Secondary | ICD-10-CM | POA: Diagnosis not present

## 2018-07-12 DIAGNOSIS — Z0000 Encounter for general adult medical examination without abnormal findings: Secondary | ICD-10-CM

## 2018-07-12 DIAGNOSIS — E7800 Pure hypercholesterolemia, unspecified: Secondary | ICD-10-CM

## 2018-07-12 DIAGNOSIS — G2581 Restless legs syndrome: Secondary | ICD-10-CM

## 2018-07-12 DIAGNOSIS — R630 Anorexia: Secondary | ICD-10-CM

## 2018-07-12 DIAGNOSIS — E559 Vitamin D deficiency, unspecified: Secondary | ICD-10-CM | POA: Diagnosis not present

## 2018-07-12 DIAGNOSIS — F3177 Bipolar disorder, in partial remission, most recent episode mixed: Secondary | ICD-10-CM

## 2018-07-12 DIAGNOSIS — R7303 Prediabetes: Secondary | ICD-10-CM

## 2018-07-12 HISTORY — DX: Abnormal weight loss: R63.4

## 2018-07-12 LAB — POCT URINALYSIS DIP (MANUAL ENTRY)
GLUCOSE UA: NEGATIVE mg/dL
Ketones, POC UA: NEGATIVE mg/dL
LEUKOCYTES UA: NEGATIVE
Nitrite, UA: NEGATIVE
Protein Ur, POC: 30 mg/dL — AB
Spec Grav, UA: >=1.03 — AB (ref 1.010–1.025)
UROBILINOGEN UA: 0.2 U/dL
pH, UA: 6 (ref 5.0–8.0)

## 2018-07-12 MED ORDER — CARBIDOPA-LEVODOPA 25-100 MG PO TABS: 1.0000 | ORAL_TABLET | Freq: Every day | ORAL | 0 refills | Status: DC

## 2018-07-12 MED ORDER — AMLODIPINE BESYLATE 10 MG PO TABS: 10.0000 mg | ORAL_TABLET | Freq: Every day | ORAL | 3 refills | Status: DC

## 2018-07-12 MED ORDER — CYANOCOBALAMIN 1000 MCG/ML IJ SOLN
1000.0000 ug | INTRAMUSCULAR | Status: DC
  Administered 2018-07-12: 1000 ug via INTRAMUSCULAR

## 2018-07-12 MED ORDER — PRAVASTATIN SODIUM 40 MG PO TABS: 40.0000 mg | ORAL_TABLET | Freq: Every day | ORAL | 3 refills | Status: DC

## 2018-07-12 MED ORDER — LEVOTHYROXINE SODIUM 100 MCG PO TABS: 100.0000 ug | ORAL_TABLET | Freq: Every day | ORAL | 1 refills | Status: DC

## 2018-07-12 MED ORDER — TRIAMCINOLONE ACETONIDE 0.1 % EX CREA: 1.0000 "application " | TOPICAL_CREAM | Freq: Four times a day (QID) | CUTANEOUS | 0 refills | Status: DC

## 2018-07-12 MED ORDER — CANDESARTAN CILEXETIL 32 MG PO TABS: 32.0000 mg | ORAL_TABLET | Freq: Every day | ORAL | 1 refills | Status: DC

## 2018-07-12 NOTE — Progress Notes (Signed)
Subjective:    Patient ID: Becky Lawson; female   DOB: December 24, 1950; 67 y.o.   MRN: [id number]  Chief Complaint  Patient presents with  . Annual Exam    HPI Primary Preventative Screenings: Cervical Cancer:  Family Planning: STI screening: Breast Cancer: Colorectal Cancer: Tobacco use/EtOH/substances: Bone Density: Cardiac: Weight/Blood sugar/Diet/Exercise: BMI Readings from Last 3 Encounters:  06/02/18 31.58 kg/m  03/24/18 32.42 kg/m  02/21/18 31.62 kg/m   Lab Results  Component Value Date   HGBA1C 5.0 02/21/2018   OTC/Vit/Supp/Herbal: Dentist/Optho: Immunizations:  Immunization History  Administered Date(s) Administered  . Influenza, High Dose Seasonal PF 04/27/2017, 05/15/2018  . Influenza,inj,Quad PF,6+ Mos 05/19/2015, 06/17/2016  . Pneumococcal Polysaccharide-23 06/17/2016  . Tdap 01/29/2017     Chronic Medical Conditions: Fatigue is extremely debilitating.  She is only able to work 3 days/wk. 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 She has not had any appetite - nothing sounds good so she doesn't eat - does eat a lot of candy so does try to make it a priority to get protein as well. No night sweats. No polydypsia or polyuria.  Stays depressed most of the time but more anxiety over the past 6 months. No particular reason - just worried about what might happen.   Is taking vit D every Sun and a little extra otc supp when she remembers. Not taking any vitamin B12 supplement.   Feels like she has a lot of phlegm in the back of her throat that she feels like she can't clear.  Can't wear anything in mouth - can't even hold a pencil in her teeth - gets clautrophobic  Working with [name] to adjust her med ot decrease the sedation but unsure if she should be taking the carabamapine - jiust showed up at her pharmacy so has not started yet - has appt to see her soon.  Does feel like  she is getting a good night sleep but was sleeping 15 hrs.  Wonders if she has restless legs as can't stop her feet moving - move so much they cramp up   Was unable to have the DaT scan as 32 min with a screen immediately in front of her face and did stop the Ingrezza as was recommended and it was causing severe muscle weakness.   Not checking BP outside of office. But has been running at goal   Not using the hydroxyzine as doesn't help and could be sedating.  Does take it every now and then for anxiety - her throat tightens up and she sucks on the back of her tongue - does take it then but usually always has to take 1/2 tab of xanax - only happens about twice a month.   Medical History: Past Medical History:  Diagnosis Date  . Anxiety   . Arthritis   . Benign cyst of right kidney 2014   2.7 cm right kidney cyst seen on imaging 2014 but was c/o right flank pain with new persistent proteinuria (fortunately hematuria had resolved) so repeat US 11/2016 showed right kidney still with 2.6 cm simple cyst and otherwise nml.  . Bipolar 1 disorder ([place])   . Chronic kidney disease   . Depression   . GERD (gastroesophageal reflux disease)   . Heart murmur   . 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 Hyperlipidemia   .  Hypertension   . Jaundice 11/01/2012  . Pruritic disorder 02/13/2013  . Sleep apnea    Was diagnosed approximately 20 years ago, but does not wear CPAP patient stated "I gave it back.Marland KitchenMarland KitchenI couldn't wear that thing it was horrible"  . Spinal stenosis    Past Surgical History:  Procedure Laterality Date  . ABDOMINAL HYSTERECTOMY    . COLONOSCOPY W/ POLYPECTOMY    . JOINT REPLACEMENT Bilateral    knee  . KNEE ARTHROSCOPY Bilateral   . PARTIAL HYSTERECTOMY    . THYROID LOBECTOMY Left 06/16/2016  . THYROID LOBECTOMY Left 06/16/2016   Procedure: LEFT THYROID LOBECTOMY;  Surgeon: Greer Pickerel, MD;  Location: [facility];  Service:  General;  Laterality: Left;   Current Outpatient Medications on File Prior to Visit  Medication Sig Dispense Refill  . ALPRAZolam (XANAX) 1 MG tablet Take 1 mg by mouth at bedtime as needed for anxiety.    [name] amLODipine (NORVASC) 10 MG tablet Take 1 tablet (10 mg total) by mouth daily. 90 tablet 3  . buPROPion (WELLBUTRIN XL) 300 MG 24 hr tablet Take 300 mg by mouth daily.  0  . candesartan (ATACAND) 32 MG tablet Take 1 tablet (32 mg total) by mouth daily. 90 tablet 1  . carbamazepine (CARBATROL) 200 MG 12 hr capsule Take 400 mg by mouth at bedtime.  0  . Cholecalciferol (VITAMIN D-3) 5000 units TABS Take 5,000 Units daily by mouth. In addition to the rx high dose once weekly vitamin D of 50Ku. 30 tablet   . clotrimazole-betamethasone (LOTRISONE) cream Apply 1 application topically 2 (two) times daily. 15 g 0  . Coenzyme Q10 (CO Q-10) 300 MG CAPS Take 1 capsule daily by mouth.    . docusate sodium (COLACE) 100 MG capsule Take 1 capsule (100 mg total) by mouth 2 (two) times daily. 10 capsule 0  . Ginkgo Biloba 40 MG TABS Take 1 tablet daily by mouth.    [name] HYDROcodone-acetaminophen (NORCO) 10-325 MG tablet Take 1 tablet by mouth 3 (three) times daily.     . hydrOXYzine (ATARAX/VISTARIL) 25 MG tablet Take 2 tablets (50 mg total) by mouth every 6 (six) hours as needed for anxiety or itching. 240 tablet 0  . lamoTRIgine (LAMICTAL) 200 MG tablet TAKE 1 TAB BY MOUTH TWICE A DAY 180 tablet 0  . levothyroxine (SYNTHROID, LEVOTHROID) 100 MCG tablet Take 1 tablet (100 mcg total) by mouth daily before breakfast. 90 tablet 1  . magnesium oxide (MAG-OX) 400 MG tablet Take 400 mg by mouth daily.    . Omega-3 Fatty Acids (FISH OIL) 1200 MG CAPS Take by mouth.    [name] omeprazole (PRILOSEC) 40 MG capsule TAKE 1 CAPSULE BY MOUTH EVERY DAY as needed for indigestion or heartburn 90 capsule 0  . pravastatin (PRAVACHOL) 40 MG tablet Take 1 tablet (40 mg total) daily by mouth. 90 tablet 3  . QUEtiapine (SEROQUEL) 300 MG  tablet Take 300 mg by mouth at bedtime.    . traZODone (DESYREL) 100 MG tablet Take 300 mg by mouth at bedtime.     Minus [place] Tosylate 80 MG CAPS Take 80 mg by mouth daily.    . Vitamin D, Ergocalciferol, (DRISDOL) 50000 units CAPS capsule Take 1 capsule (50,000 Units total) by mouth 2 (two) times a week. 26 capsule 1  . vitamin E 600 UNIT capsule Take 600 Units by mouth daily.     Current Facility-Administered Medications on File Prior to Visit  Medication Dose Route Frequency Provider  Last Rate Last Dose  . methylPREDNISolone acetate (DEPO-MEDROL) injection 40 mg  40 mg Intramuscular Once Shawnee Knapp, MD       Allergies  Allergen Reactions  . No Known Allergies    Family History  Problem Relation Age of Onset  . Diabetes Mother   . Heart disease Mother   . Hyperlipidemia Mother   . Hypertension Mother   . Stroke Mother   . Hypertension Father   . Parkinson's disease Father   . Heart disease Father   . Hyperlipidemia Father   . Hemachromatosis Daughter   . Thyroid disease Neg Hx    Social History   Socioeconomic History  . Marital status: Divorced    Spouse name: Not on file  . Number of children: Not on file  . Years of education: Not on file  . Highest education level: Not on file  Occupational History  . Occupation: Geophysical data processor  Social Needs  . Financial resource strain: Not on file  . Food insecurity:    Worry: Not on file    Inability: Not on file  . Transportation needs:    Medical: Not on file    Non-medical: Not on file  Tobacco Use  . Smoking status: Former Smoker    Packs/day: 1.00    Years: 15.00    Pack years: 15.00    Last attempt to quit: 06/30/1979    Years since quitting: 39.0  . Smokeless tobacco: Never Used  Substance and Sexual Activity  . Alcohol use: Yes    Alcohol/week: 1.0 standard drinks    Types: 1 Standard drinks or equivalent per week    Comment: 1 glass of red wine daily  . Drug use: No  . Sexual activity: Yes     Birth control/protection: None  Lifestyle  . Physical activity:    Days per week: Not on file    Minutes per session: Not on file  . Stress: Not on file  Relationships  . Social connections:    Talks on phone: Not on file    Gets together: Not on file    Attends religious service: Not on file    Active member of club or organization: Not on file    Attends meetings of clubs or organizations: Not on file    Relationship status: Not on file  Other Topics Concern  . Not on file  Social History Narrative   Divorced. Education: [employer]. Exercise: No.   Depression screen [facility] 2/9 03/24/2018 02/21/2018 02/21/2018 07/24/2017 07/13/2017  Decreased Interest 3 3 0 3 3  Down, Depressed, Hopeless 3 3 0 3 3  PHQ - 2 Score 6 6 0 6 6  Altered sleeping 3 2 - 1 0  Tired, decreased energy 3 3 - 3 3  Change in appetite 0 3 - 3 3  Feeling bad or failure about yourself  2 2 - 3 3  Trouble concentrating 0 1 - 3 3  Moving slowly or fidgety/restless 0 1 - 3 0  Suicidal thoughts 0 0 - 0 0  PHQ-9 Score 14 18 - 22 18  Difficult doing work/chores - Extremely dIfficult - Very difficult Somewhat difficult  Some recent data might be hidden     ROS As noted in HPI All others reviewed and negative.     Objective:  There were no vitals taken for this visit. No exam data present Physical Exam       POC TESTING Office Visit on 07/12/2018  Component  Date Value Ref Range Status  . Color, UA 07/12/2018 yellow  yellow Final  . Clarity, UA 07/12/2018 clear  clear Final  . Glucose, UA 07/12/2018 negative  negative mg/dL Final  . Bilirubin, UA 07/12/2018 small* negative Final  . Ketones, POC UA 07/12/2018 negative  negative mg/dL Final  . Spec Grav, UA 07/12/2018 >=1.030* 1.010 - 1.025 Final  . Blood, UA 07/12/2018 trace-lysed* negative Final  . pH, UA 07/12/2018 6.0  5.0 - 8.0 Final  . Protein Ur, POC 07/12/2018 =30* negative mg/dL Final  . Urobilinogen, UA 07/12/2018 0.2  0.2 or 1.0 E.U./dL Final  .  Nitrite, UA 07/12/2018 Negative  Negative Final  . Leukocytes, UA 07/12/2018 Negative  Negative Final     Assessment & Plan:   1. Annual physical exam   2. Screening for cardiovascular, respiratory, and genitourinary diseases   3. Encounter for screening mammogram for breast cancer   4. Screening for colorectal cancer   5. Essential hypertension   6. Gastroesophageal reflux disease, esophagitis presence not specified   7. Fatty liver   8. OSA (obstructive sleep apnea)   9. Post-surgical hypothyroidism   10. Vitamin D deficiency   11. Pure hypercholesterolemia   12. Bipolar disorder, in partial remission, most recent episode mixed ([place])   13. Tremor   14. Prediabetes   15. Polypharmacy   16. Loss of appetite   17. Anhedonia   18. Unintentional weight loss of more than 10 pounds   19. Granuloma of skin   20. Nocturnal foot cramps   21. Restless legs       Patient will continue on current chronic medications other than changes noted above, so ok to refill when needed.   Reviewed all health maintenance recommendations per USPSTF guidelines.   See after visit summary for patient specific instructions.  Orders Placed This Encounter  Procedures  . MM DIGITAL SCREENING BILATERAL    HTA PF: 05/02/17@[facility]     Standing Status:   Future    Standing Expiration Date:   09/12/2019    Order Specific Question:   Reason for Exam (SYMPTOM  OR DIAGNOSIS REQUIRED)    Answer:   screening for breast cancer    Order Specific Question:   Preferred imaging location?    Answer:   [facility]  . Pneumococcal conjugate vaccine 13-valent IM  . Comprehensive metabolic panel    Order Specific Question:   Has the patient fasted?    Answer:   Yes  . TSH  . Lipid panel    Order Specific Question:   Has the patient fasted?    Answer:   Yes  . Hemoglobin A1c  . CBC with Differential/Platelet  . VITAMIN D 25 Hydroxy (Vit-D Deficiency, Fractures)  . Vitamin B12  . Ferritin  . Ambulatory referral  to ENT    Referral Priority:   Routine    Referral Type:   Consultation    Referral Reason:   Specialty Services Required    Requested Specialty:   Otolaryngology    Number of Visits Requested:   1  . POCT urinalysis dipstick    Meds ordered this encounter  Medications  . cyanocobalamin ((VITAMIN B-12)) injection 1,000 mcg  . triamcinolone cream (KENALOG) 0.1 %    Sig: Apply 1 application topically 4 (four) times daily.    Dispense:  45 g    Refill:  0  . levothyroxine (SYNTHROID, LEVOTHROID) 100 MCG tablet    Sig: Take 1 tablet (100 mcg total) by  mouth daily before breakfast.    Dispense:  90 tablet    Refill:  1  . pravastatin (PRAVACHOL) 40 MG tablet    Sig: Take 1 tablet (40 mg total) by mouth daily.    Dispense:  90 tablet    Refill:  3  . amLODipine (NORVASC) 10 MG tablet    Sig: Take 1 tablet (10 mg total) by mouth daily.    Dispense:  90 tablet    Refill:  3  . candesartan (ATACAND) 32 MG tablet    Sig: Take 1 tablet (32 mg total) by mouth daily.    Dispense:  90 tablet    Refill:  1  . carbidopa-levodopa (SINEMET IR) 25-100 MG tablet    Sig: Take 1 tablet by mouth at bedtime.    Dispense:  30 tablet    Refill:  0    Patient verbalized to me that they understand the following: diagnosis, what is being done for them, what to expect and what should be done at home.  Their questions have been answered. They understand that I am unable to predict every possible medication interaction or adverse outcome and that if any unexpected symptoms arise, they should contact us and their pharmacist, as well as never hesitate to seek urgent/emergent care at [facility] or ER if they think it might be warranted.    [name], MD, MPH [facility] [address] [place], [place]  [phone] Office phone  [phone] Office fax   07/12/18 3:48 PM

## 2018-07-12 NOTE — Patient Instructions (Addendum)
If you have lab work done today you will be contacted with your lab results within the next 2 weeks.  If you have not heard from [place] then please contact us. The fastest way to get your results is to register for My Chart.   IF you received an x-ray today, you will receive an invoice from [facility]. Please contact [facility] at [phone] with questions or concerns regarding your invoice.   IF you received labwork today, you will receive an invoice from [place]. Please contact [facility] at [phone] with questions or concerns regarding your invoice.   Our billing staff will not be able to assist you with questions regarding bills from these companies.  You will be contacted with the lab results as soon as they are available. The fastest way to get your results is to activate your My Chart account. Instructions are located on the last page of this paperwork. If you have not heard from [place] regarding the results in 2 weeks, please contact this office.     Fatigue Fatigue is feeling tired all of the time, a lack of energy, or a lack of motivation. Occasional or mild fatigue is often a normal response to activity or life in general. However, long-lasting (chronic) or extreme fatigue may indicate an underlying medical condition. Follow these instructions at home: Watch your fatigue for any changes. The following actions may help to lessen any discomfort you are feeling:  Talk to your health care provider about how much sleep you need each night. Try to get the required amount every night.  Take medicines only as directed by your health care provider.  Eat a healthy and nutritious diet. Ask your health care provider if you need help changing your diet.  Drink enough fluid to keep your urine clear or pale yellow.  Practice ways of relaxing, such as yoga, meditation, massage therapy, or acupuncture.  Exercise regularly.  Change situations that cause you stress. Try to  keep your work and personal routine reasonable.  Do not abuse illegal drugs.  Limit alcohol intake to no more than 1 drink per day for nonpregnant women and 2 drinks per day for men. One drink equals 12 ounces of beer, 5 ounces of wine, or 1 ounces of hard liquor.  Take a multivitamin, if directed by your health care provider.  Contact a health care provider if:  Your fatigue does not get better.  You have a fever.  You have unintentional weight loss or gain.  You have headaches.  You have difficulty: ? Falling asleep. ? Sleeping throughout the night.  You feel angry, guilty, anxious, or sad.  You are unable to have a bowel movement (constipation).  You skin is dry.  Your legs or another part of your body is swollen. Get help right away if:  You feel confused.  Your vision is blurry.  You feel faint or pass out.  You have a severe headache.  You have severe abdominal, pelvic, or back pain.  You have chest pain, shortness of breath, or an irregular or fast heartbeat.  You are unable to urinate or you urinate less than normal.  You develop abnormal bleeding, such as bleeding from the rectum, vagina, nose, lungs, or nipples.  You vomit blood.  You have thoughts about harming yourself or committing suicide.  You are worried that you might harm someone else. This information is not intended to replace advice given to you by your health care provider. Make sure you discuss  any questions you have with your health care provider. Document Released: 06/19/2007 Document Revised: 01/28/2016 Document Reviewed: 12/24/2013 Elsevier Interactive Patient Education  2018 [place].  Chronic Fatigue Syndrome Chronic fatigue syndrome (CFS) is a condition that causes extreme tiredness (fatigue). This fatigue does not improve with rest, and it gets worse with physical or mental activity. You may have several other symptoms along with fatigue. Symptoms may come and go, but they  generally last for months. Sometimes, CFS gets better over time, but it can be a lifelong condition. There is no cure, but there are many possible treatments. You will need to work with your health care providers to find a treatment plan that works best for you. What are the causes? The cause of CFS is not known. There may be more than one cause. Possible causes include:  An infection.  An abnormal body defense system (immune system).  Low blood pressure.  Poor diet.  Physical or emotional stress.  What increases the risk? You are more likely to develop this condition if:  You are female.  You are 4?67 years old.  You have a family history of CFS.  You live with a lot of emotional stress.  What are the signs or symptoms? The main symptom of CFS is fatigue that is severe enough to interfere with day-to-day activities. This fatigue does not get better with rest, and it gets worse with physical or mental activity. There are eight other major symptoms of CFS:  Lack of energy (malaise) that lasts more than 24 hours after physical exertion.  Sleep that does not relieve fatigue (unrefreshing sleep).  Short-term memory loss or confusion.  Joint pain without redness or swelling.  Muscle aches.  Headaches.  Painful and swollen glands (lymph nodes) in the neck or under the arms.  Sore throat.  You may also have:  Abdominal cramps, constipation, or diarrhea (irritable bowel).  Chills.  Night sweats.  Vision changes.  Dizziness.  Mental confusion (brain fog).  Clumsiness.  Sensitivity to food, noise, or odors.  Mood swings, depression, or anxiety attacks.  How is this diagnosed? There are no tests that can diagnose this condition. Your health care provider will make the diagnosis based on your medical history, a physical exam, and a mental health exam. However, it is important to make sure that your symptoms are not caused by another medical condition. You may  have lab tests or X-rays to rule out other conditions. For your health care provider to diagnose CFS:  You must have had fatigue for at least 6 straight months.  Fatigue must be your first symptom, and it must be severe enough to interfere with day-to-day activities.  There must be no other cause found for the fatigue.  You must also have at least four of the eight other major symptoms of CFS.  How is this treated? There is no cure for CFS. The condition affects everyone differently. You will need to work with your team of health care providers to find the best treatments for your symptoms. Your team may include your primary care provider, physical and exercise therapists, and mental health therapists. Treatment may include:  Improving sleep with a regular bedtime routine.  Avoiding caffeine, alcohol, and tobacco.  Doing light exercise and stretching during the day.  Taking medicines to help you sleep or to relieve joint or muscle pain.  Learning and practicing relaxation techniques.  Using memory aids or doing brainteasers to improve memory and concentration.  Seeing a  mental health therapist to evaluate and treat depression, if necessary.  Trying massage therapy, acupuncture, and movement exercises, such as yoga or tai chi.  Follow these instructions at home:  Activity  Exercise regularly, as told by your health care provider.  Avoid fatigue by pacing yourself during the day and getting enough sleep at night.  Go to bed and get up at the same time every day. Eating and drinking  Avoid caffeine and alcohol.  Avoid heavy meals in the evening.  Eat a well-balanced diet. General instructions  Take over-the-counter and prescription medicines only as told by your health care provider.  Do not use herbal or dietary supplements unless they are approved by your health care provider.  Maintain a healthy weight.  Avoid stress and use stress-reducing techniques that you  learn in therapy.  Do not use any products that contain nicotine or tobacco, such as cigarettes and e-cigarettes. If you need help quitting, ask your health care provider.  Consider joining a CFS support group.  Keep all follow-up visits as told by your health care provider. This is important. Contact a health care provider if:  Your symptoms do not get better or they get worse.  You feel angry, guilty, anxious, or depressed. This information is not intended to replace advice given to you by your health care provider. Make sure you discuss any questions you have with your health care provider. Document Released: 09/29/2004 Document Revised: 04/28/2016 Document Reviewed: 11/30/2015 Elsevier Interactive Patient Education  [employer].

## 2018-07-13 LAB — VITAMIN B12: VITAMIN B 12: 364 pg/mL (ref 232–1245)

## 2018-07-13 LAB — LIPID PANEL
CHOL/HDL RATIO: 3.5 ratio (ref 0.0–4.4)
CHOLESTEROL TOTAL: 178 mg/dL (ref 100–199)
HDL: 51 mg/dL (ref >39)
LDL CALC: 108 mg/dL — AB (ref 0–99)
Triglycerides: 96 mg/dL (ref 0–149)
VLDL Cholesterol Cal: 19 mg/dL (ref 5–40)

## 2018-07-13 LAB — COMPREHENSIVE METABOLIC PANEL
A/G RATIO: 2.6 — AB (ref 1.2–2.2)
ALBUMIN: 4.6 g/dL (ref 3.6–4.8)
ALT: 9 IU/L (ref 0–32)
AST: 14 IU/L (ref 0–40)
Alkaline Phosphatase: 72 IU/L (ref 39–117)
BUN / CREAT RATIO: 16 (ref 12–28)
BUN: 14 mg/dL (ref 8–27)
Bilirubin Total: 0.3 mg/dL (ref 0.0–1.2)
CALCIUM: 9.5 mg/dL (ref 8.7–10.3)
CO2: 23 mmol/L (ref 20–29)
Chloride: 105 mmol/L (ref 96–106)
Creatinine, Ser: 0.85 mg/dL (ref 0.57–1.00)
GFR, EST AFRICAN AMERICAN: 82 mL/min/{1.73_m2} (ref >59)
GFR, EST NON AFRICAN AMERICAN: 71 mL/min/{1.73_m2} (ref >59)
GLOBULIN, TOTAL: 1.8 g/dL (ref 1.5–4.5)
Glucose: 95 mg/dL (ref 65–99)
Potassium: 4.3 mmol/L (ref 3.5–5.2)
SODIUM: 141 mmol/L (ref 134–144)
TOTAL PROTEIN: 6.4 g/dL (ref 6.0–8.5)

## 2018-07-13 LAB — CBC WITH DIFFERENTIAL/PLATELET
BASOS ABS: 0 10*3/uL (ref 0.0–0.2)
Basos: 0 %
EOS (ABSOLUTE): 0 10*3/uL (ref 0.0–0.4)
Eos: 1 %
Hematocrit: 37.6 % (ref 34.0–46.6)
Hemoglobin: 13 g/dL (ref 11.1–15.9)
Immature Grans (Abs): 0 10*3/uL (ref 0.0–0.1)
Immature Granulocytes: 0 %
LYMPHS ABS: 1.7 10*3/uL (ref 0.7–3.1)
Lymphs: 33 %
MCH: 32 pg (ref 26.6–33.0)
MCHC: 34.6 g/dL (ref 31.5–35.7)
MCV: 93 fL (ref 79–97)
MONOS ABS: 0.5 10*3/uL (ref 0.1–0.9)
Monocytes: 9 %
NEUTROS ABS: 3 10*3/uL (ref 1.4–7.0)
Neutrophils: 57 %
Platelets: 209 10*3/uL (ref 150–450)
RBC: 4.06 x10E6/uL (ref 3.77–5.28)
RDW: 12.2 % — AB (ref 12.3–15.4)
WBC: 5.2 10*3/uL (ref 3.4–10.8)

## 2018-07-13 LAB — FERRITIN: Ferritin: 119 ng/mL (ref 15–150)

## 2018-07-13 LAB — HEMOGLOBIN A1C
ESTIMATED AVERAGE GLUCOSE: 97 mg/dL
HEMOGLOBIN A1C: 5 % (ref 4.8–5.6)

## 2018-07-13 LAB — VITAMIN D 25 HYDROXY (VIT D DEFICIENCY, FRACTURES): Vit D, 25-Hydroxy: 35.6 ng/mL (ref 30.0–100.0)

## 2018-07-13 LAB — TSH: TSH: 0.824 u[IU]/mL (ref 0.450–4.500)

## 2018-07-16 ENCOUNTER — Telehealth: Payer: Self-pay | Admitting: Family Medicine

## 2018-07-16 NOTE — Telephone Encounter (Signed)
You wanted patient to see Dr. Benjamine Mola  He does not treat adult sleep apnea per fax recvd. Please adv if there is somewhere/someone else you would like for [place] to send her  Thank you

## 2018-07-19 ENCOUNTER — Encounter: Payer: Self-pay | Admitting: Neurology

## 2018-07-19 ENCOUNTER — Ambulatory Visit (INDEPENDENT_AMBULATORY_CARE_PROVIDER_SITE_OTHER): Payer: PPO | Admitting: Neurology

## 2018-07-19 VITALS — BP 144/62 | HR 80 | Ht 63.5 in | Wt 188.0 lb

## 2018-07-19 DIAGNOSIS — G20 Parkinson's disease: Secondary | ICD-10-CM

## 2018-07-19 DIAGNOSIS — R251 Tremor, unspecified: Secondary | ICD-10-CM

## 2018-07-19 NOTE — Progress Notes (Signed)
Subjective:    Patient ID: Becky Lawson is a 67 y.o. female.  HPI     Interim history:   Becky Lawson is a 67 year old left-handed woman with an underlying medical history of reflux disease, mood disorder with a diagnosis of bipolar disorder, abnormal liver function tests, lumbar spinal stenosis, hypothyroidism, obesity, sleep apnea, hypertension, hyperlipidemia, and arthritis, who presents for follow-up consultation of her tremors. The patient is unaccompanied today. I first met her on 02/20/2018 at the request of her primary care physician, at which time she reported a one year history of tremors with a longer standing history of mild her tremors in the past. She had features of essential tremor primarily but also very mild parkinsonism on examination. I had suggested we proceed with a DaT scan, but she could not go through with it. I did not suggest any new medications but did encourage her to talk to her psychiatry provider about medication management. She was on multiple psychotropic medications.  Today, 07/19/2018: She reports that she could not go through with the scan as she has claustrophobia. Her primary care physician prescribed Sinemet to be taken at bedtime. She recently picked it up. She has not noticed any change in her tremor. She does report mouth dryness. Of note, she continues to take multiple psychotropic medications. She had a recent sleep study in September which showed mild sleep apnea. She was advised to pursue weight loss. She tries to hydrate well with water. She had some flareup of her back pain recently. She sees Dr. [name] for pain management. She has had no recent falls.  Patient's allergies, current medications, family history, past medical history, past social history, past surgical history and problem list were reviewed and updated as appropriate.   Previously:   (She) reports a bilateral upper extremity tremor for the past 1 year, progressive, more so on the right. She  has noticed changes in her handwriting. She writes with her left hand. She does not have any brothers or sisters. She also reports a family history of Parkinson's disease in her father. I reviewed your office note from 10/21/2017. She was started on Ingrezza for tardive dyskinesias by her psychiatrist. She sees [name] for psychiatry. She has been on [name] for months. She was recently started on Ingrezza, but has not noticed an improvement in her tremors. Her tremor seems to be more on the right side than left. Her father lived to be 19 years old and also had strokes. Her mother lived to be 20 years old and had congestive heart failure, also history of stroke. Patient was seen in this office several years ago by Dr. love. She had a brain MRI at the time secondary to tremors and frequent falls. She also had a sleep evaluation with Dr. [name] in 2011. She had an EEG in 2009 which was normal in the awake and light sleep stages. She has a long-standing history of bipolar disease. She has been on multiple different medications in the past but does not recall trying Abilify, Geodon, or Haldol. She has been on this but all in the past and has been on lithium but had significant side effects on it. She is divorced for many years, she has 1 son, 1 daughter, neither have tremors. She has no other family history of tremors. She lives alone. She does not smoke and does not drink: A regular basis, drinks caffeine in the form of coffee, one cup per day on average. She takes Xanax for anxiety  and also at night for sleep. She endorses increase in anxiety recently, increase in stress. She also is on Wellbutrin 300 mg strength once daily long-acting. Of note, in reviewing her previous office records, she has had a bilateral hand tremor for over 10 years, was previously labeled with essential tremor.  Her Past Medical History Is Significant For: Past Medical History:  Diagnosis Date  . Anxiety   . Arthritis   . Benign  cyst of right kidney 2014   2.7 cm right kidney cyst seen on imaging 2014 but was c/o right flank pain with new persistent proteinuria (fortunately hematuria had resolved) so repeat US 11/2016 showed right kidney still with 2.6 cm simple cyst and otherwise nml.  . Bipolar 1 disorder ([place])   . Chronic kidney disease   . Depression   . GERD (gastroesophageal reflux disease)   . Heart murmur   . 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 Hyperlipidemia   . Hypertension   . Jaundice 11/01/2012  . Pruritic disorder 02/13/2013  . Sleep apnea    Was diagnosed approximately 20 years ago, but does not wear CPAP patient stated "I gave it back.Marland KitchenMarland KitchenI couldn't wear that thing it was horrible"  . Spinal stenosis     Her Past Surgical History Is Significant For: Past Surgical History:  Procedure Laterality Date  . ABDOMINAL HYSTERECTOMY    . COLONOSCOPY W/ POLYPECTOMY    . JOINT REPLACEMENT Bilateral    knee  . KNEE ARTHROSCOPY Bilateral   . PARTIAL HYSTERECTOMY    . THYROID LOBECTOMY Left 06/16/2016  . THYROID LOBECTOMY Left 06/16/2016   Procedure: LEFT THYROID LOBECTOMY;  Surgeon: Greer Pickerel, MD;  Location: [facility];  Service: General;  Laterality: Left;    Her Family History Is Significant For: Family History  Problem Relation Age of Onset  . Diabetes Mother   . Heart disease Mother   . Hyperlipidemia Mother   . Hypertension Mother   . Stroke Mother   . Hypertension Father   . Parkinson's disease Father   . Heart disease Father   . Hyperlipidemia Father   . Hemachromatosis Daughter   . Thyroid disease Neg Hx     Her Social History Is Significant For: Social History   Socioeconomic History  . Marital status: Divorced    Spouse name: Not on file  . Number of children: Not on file  . Years of education: Not on file  . Highest education level: Not on file  Occupational History  . Occupation: Geophysical data processor  Social Needs  .  Financial resource strain: Not on file  . Food insecurity:    Worry: Not on file    Inability: Not on file  . Transportation needs:    Medical: Not on file    Non-medical: Not on file  Tobacco Use  . Smoking status: Former Smoker    Packs/day: 1.00    Years: 15.00    Pack years: 15.00    Last attempt to quit: 06/30/1979    Years since quitting: 39.0  . Smokeless tobacco: Never Used  Substance and Sexual Activity  . Alcohol use: Yes    Alcohol/week: 1.0 standard drinks    Types: 1 Standard drinks or equivalent per week    Comment: 1 glass of red wine daily  . Drug use: No  . Sexual activity: Yes    Birth control/protection: None  Lifestyle  . Physical activity:  Days per week: Not on file    Minutes per session: Not on file  . Stress: Not on file  Relationships  . Social connections:    Talks on phone: Not on file    Gets together: Not on file    Attends religious service: Not on file    Active member of club or organization: Not on file    Attends meetings of clubs or organizations: Not on file    Relationship status: Not on file  Other Topics Concern  . Not on file  Social History Narrative   Divorced. Education: [employer]. Exercise: No.    Her Allergies Are:  Allergies  Allergen Reactions  . No Known Allergies   :   Her Current Medications Are:  Outpatient Encounter Medications as of 07/19/2018  Medication Sig  . ALPRAZolam (XANAX) 1 MG tablet Take 1 mg by mouth at bedtime as needed for anxiety.  [name] amLODipine (NORVASC) 10 MG tablet Take 1 tablet (10 mg total) by mouth daily.  [name] buPROPion (WELLBUTRIN XL) 300 MG 24 hr tablet Take 300 mg by mouth daily.  . candesartan (ATACAND) 32 MG tablet Take 1 tablet (32 mg total) by mouth daily.  . carbamazepine (CARBATROL) 200 MG 12 hr capsule Take 400 mg by mouth at bedtime.  . carbidopa-levodopa (SINEMET IR) 25-100 MG tablet Take 1 tablet by mouth at bedtime.  . Cholecalciferol (VITAMIN D-3) 5000 units TABS Take 5,000  Units daily by mouth. In addition to the rx high dose once weekly vitamin D of 50Ku.  [name] Coenzyme Q10 (CO Q-10) 300 MG CAPS Take 1 capsule daily by mouth.  . docusate sodium (COLACE) 100 MG capsule Take 1 capsule (100 mg total) by mouth 2 (two) times daily.  . Ginkgo Biloba 40 MG TABS Take 1 tablet daily by mouth.  [name] HYDROcodone-acetaminophen (NORCO) 10-325 MG tablet Take 1 tablet by mouth 3 (three) times daily.   . hydrOXYzine (ATARAX/VISTARIL) 25 MG tablet Take 2 tablets (50 mg total) by mouth every 6 (six) hours as needed for anxiety or itching.  . lamoTRIgine (LAMICTAL) 200 MG tablet TAKE 1 TAB BY MOUTH TWICE A DAY  . levothyroxine (SYNTHROID, LEVOTHROID) 100 MCG tablet Take 1 tablet (100 mcg total) by mouth daily before breakfast.  . magnesium oxide (MAG-OX) 400 MG tablet Take 400 mg by mouth daily.  . Omega-3 Fatty Acids (FISH OIL) 1200 MG CAPS Take by mouth.  [name] omeprazole (PRILOSEC) 40 MG capsule TAKE 1 CAPSULE BY MOUTH EVERY DAY as needed for indigestion or heartburn  . pravastatin (PRAVACHOL) 40 MG tablet Take 1 tablet (40 mg total) by mouth daily.  . QUEtiapine (SEROQUEL) 300 MG tablet Take 300 mg by mouth at bedtime.  . traZODone (DESYREL) 100 MG tablet Take 300 mg by mouth at bedtime.   . triamcinolone cream (KENALOG) 0.1 % Apply 1 application topically 4 (four) times daily.  Minus [place] Tosylate 80 MG CAPS Take 80 mg by mouth daily.  . Vitamin D, Ergocalciferol, (DRISDOL) 50000 units CAPS capsule Take 1 capsule (50,000 Units total) by mouth 2 (two) times a week.  . vitamin E 600 UNIT capsule Take 600 Units by mouth daily.   Facility-Administered Encounter Medications as of 07/19/2018  Medication  . cyanocobalamin ((VITAMIN B-12)) injection 1,000 mcg  :  Review of Systems:  Out of a complete 14 point review of systems, all are reviewed and negative with the exception of these symptoms as listed below: Review of Systems  Neurological:  Pt presents today for follow up. She  has not started sinemet because she keeps forgetting to pick it up.    Objective:  Neurological Exam  Physical Exam Physical Examination:   Vitals:   07/19/18 1259  BP: (!) 144/62  Pulse: 80   General Examination: The patient is a very pleasant 67 y.o. female in no acute distress. She appears well-developed and well-nourished and well groomed.   HEENT: Normocephalic, atraumatic, pupils are equal, round and reactive to light and accommodation. She has no significant facial masking, mild difficulty with tracking, minimal nuchal rigidity. Hearing is grossly intact, no significant speech difficulty, no telltale hypophonia, no voice tremor. She has an intermittent lower lip tremor. She has no oral facial dyskinesias or tongue dyskinesias. Unremarkable scar from partial thyroidectomy. Tongue protrudes centrally and palate elevates symmetrically. Mouth is moderately dry.  Chest: Clear to auscultation without wheezing, rhonchi or crackles noted.  Heart: S1+S2+0, regular and normal without murmurs, rubs or gallops noted.   Abdomen: Soft, non-tender and non-distended with normal bowel sounds appreciated on auscultation.  Extremities: There is no pitting edema in the distal lower extremities bilaterally.  Skin: Warm and dry without trophic changes noted.  Musculoskeletal: exam reveals status post bilateral knee replacement surgeries, right hip pain, LBP.   Neurologically:  Mental status: The patient is awake, alert and oriented in all 4 spheres. Her immediate and remote memory, attention, language skills and fund of knowledge are appropriate. There is no evidence of aphasia, agnosia, apraxia or anomia. Speech is clear with normal prosody and enunciation. Thought process is linear. Mood is normal and affect is normal.  Cranial nerves II - XII are as described above under HEENT exam. In addition: shoulder shrug is normal with equal shoulder height noted. Motor exam: Normal bulk, strength  and fairly normal tone is noted. No obvious cogwheeling is noted. She has no dyskinesias.   (On 02/20/2018: On Archimedes spiral drawing she has mild to moderate insecurity with the right, minimal difficulty with the left, no telltale trembling so. Handwriting is small, legible, not tremulous per se.) She has no significant resting tremor today, she does have a mild postural tremor, right more than left and mild action tremor also little bit more on the right. She has mild difficulty with fine motor skills throughout. She stands up with mild difficulty and pushes herself up, upper body a little bit more tilted forward than last time but she does report back pain today. She walks without shuffling, she does walk slowly and cautiously, fairly preserved arm swing today, no walking aid.  Cerebellar testing: No dysmetria or intention tremor. There is no truncal or gait ataxia.  Sensory exam: intact to light touch in the upper and lower extremities.   Assessment and Plan:   In summary, Mercie Balsley is a very pleasant 67 year old female with an underlying medical history of reflux disease, mood disorder with a diagnosis of bipolar disorder, abnormal liver function tests, lumbar spinal stenosis, hypothyroidism, obesity, sleep apnea, hypertension, hyperlipidemia, and arthritis, who presents for Follow-up consultation of her tremors. She does not have any significant parkinsonism today, could be secondary to medication changes recently. Nevertheless, she does have a history of essential tremor and her exam is in that regard fairly stable. Unfortunately, she could not go through with the DaT scan that I ordered, she has significant claustrophobia. She is advised to monitor her symptoms. I would not favor Sinemet in her case at this point. She is on multiple psychotropic medications  and I would not recommend any other medication for tremor control symptomatically. She understands this and is agreeable. She is advised  to stay well-hydrated and monitor her symptoms. I would be happy to see her back on an as-needed basis. I answered all her questions today and she was in agreement. I spent 25 minutes in total face-to-face time with the patient, more than 50% of which was spent in counseling and coordination of care, reviewing test results, reviewing medication and discussing or reviewing the diagnosis of tremor, its prognosis and treatment options. Pertinent laboratory and imaging test results that were available during this visit with the patient were reviewed by me and considered in my medical decision making (see chart for details).

## 2018-07-19 NOTE — Patient Instructions (Addendum)
Your exam is stable, but your back is more tilted today.  I think you can continue to monitor the tremor, you don't have to use the Sinemet.  Unfortunately, you could not go through with the DaT scan.  I would suggest as needed follow up at this point.

## 2018-07-23 ENCOUNTER — Ambulatory Visit: Payer: Self-pay | Admitting: Psychiatry

## 2018-07-31 ENCOUNTER — Other Ambulatory Visit: Payer: Self-pay

## 2018-07-31 MED ORDER — QUETIAPINE FUMARATE 300 MG PO TABS: 300.0000 mg | ORAL_TABLET | Freq: Every day | ORAL | 1 refills | Status: DC

## 2018-08-01 ENCOUNTER — Ambulatory Visit: Payer: PPO | Admitting: Psychiatry

## 2018-08-01 DIAGNOSIS — F3175 Bipolar disorder, in partial remission, most recent episode depressed: Secondary | ICD-10-CM | POA: Diagnosis not present

## 2018-08-04 ENCOUNTER — Other Ambulatory Visit: Payer: Self-pay | Admitting: Family Medicine

## 2018-08-06 NOTE — Progress Notes (Signed)
[facility]  Patient ID: Noel Henandez,  MRN: [id number]  PCP: Shawnee Knapp, MD  Date of Evaluation: 08/01/18 Time spent:20 minutes  Chief Complaint:   HISTORY/CURRENT STATUS: HPI patient seen last 04/19/2018.  At that time her anxiety was worse, increase the Seroquel 1 50-300.  Depression was same about 7/10. Currently the patient's anxiety is somewhat better.  Her depression is about the same, 8/10. No suicidal thoughts.  He has had some manic symptoms 2 to 3 weeks ago.  Individual Medical History/ Review of Systems: Changes? : hand tremor for 2 years, worse now. Ingrezza didn't help.  Allergies: No known allergies  Current Medications:  Current Outpatient Medications:  .  ALPRAZolam (XANAX) 1 MG tablet, Take 1 mg by mouth at bedtime as needed for anxiety., Disp: , Rfl:  .  buPROPion (WELLBUTRIN XL) 300 MG 24 hr tablet, Take 300 mg by mouth daily., Disp: , Rfl: 0 .  hydrOXYzine (ATARAX/VISTARIL) 25 MG tablet, Take 2 tablets (50 mg total) by mouth every 6 (six) hours as needed for anxiety or itching., Disp: 240 tablet, Rfl: 0 .  lamoTRIgine (LAMICTAL) 200 MG tablet, TAKE 1 TAB BY MOUTH TWICE A DAY, Disp: 180 tablet, Rfl: 0 .  traZODone (DESYREL) 100 MG tablet, Take 300 mg by mouth at bedtime. , Disp: , Rfl:  .  amLODipine (NORVASC) 10 MG tablet, Take 1 tablet (10 mg total) by mouth daily., Disp: 90 tablet, Rfl: 3 .  candesartan (ATACAND) 32 MG tablet, Take 1 tablet (32 mg total) by mouth daily., Disp: 90 tablet, Rfl: 1 .  carbidopa-levodopa (SINEMET IR) 25-100 MG tablet, TAKE 1 TABLET BY MOUTH EVERYDAY AT BEDTIME, Disp: 30 tablet, Rfl: 0 .  Cholecalciferol (VITAMIN D-3) 5000 units TABS, Take 5,000 Units daily by mouth. In addition to the rx high dose once weekly vitamin D of 50Ku., Disp: 30 tablet, Rfl:  .  Coenzyme Q10 (CO Q-10) 300 MG CAPS, Take 1 capsule daily by mouth., Disp: , Rfl:  .  docusate sodium (COLACE) 100 MG capsule, Take 1 capsule (100 mg total) by mouth 2  (two) times daily., Disp: 10 capsule, Rfl: 0 .  Ginkgo Biloba 40 MG TABS, Take 1 tablet daily by mouth., Disp: , Rfl:  .  HYDROcodone-acetaminophen (NORCO) 10-325 MG tablet, Take 1 tablet by mouth 3 (three) times daily. , Disp: , Rfl:  .  levothyroxine (SYNTHROID, LEVOTHROID) 100 MCG tablet, Take 1 tablet (100 mcg total) by mouth daily before breakfast., Disp: 90 tablet, Rfl: 1 .  magnesium oxide (MAG-OX) 400 MG tablet, Take 400 mg by mouth daily., Disp: , Rfl:  .  Omega-3 Fatty Acids (FISH OIL) 1200 MG CAPS, Take by mouth., Disp: , Rfl:  .  omeprazole (PRILOSEC) 40 MG capsule, TAKE 1 CAPSULE BY MOUTH EVERY DAY as needed for indigestion or heartburn, Disp: 90 capsule, Rfl: 0 .  pravastatin (PRAVACHOL) 40 MG tablet, Take 1 tablet (40 mg total) by mouth daily., Disp: 90 tablet, Rfl: 3 .  QUEtiapine (SEROQUEL) 300 MG tablet, Take 1 tablet (300 mg total) by mouth at bedtime., Disp: 30 tablet, Rfl: 1 .  triamcinolone cream (KENALOG) 0.1 %, Apply 1 application topically 4 (four) times daily., Disp: 45 g, Rfl: 0 .  Vitamin D, Ergocalciferol, (DRISDOL) 50000 units CAPS capsule, Take 1 capsule (50,000 Units total) by mouth 2 (two) times a week., Disp: 26 capsule, Rfl: 1 .  vitamin E 600 UNIT capsule, Take 600 Units by mouth daily., Disp: , Rfl:  Current Facility-Administered Medications:  .  cyanocobalamin ((VITAMIN B-12)) injection 1,000 mcg, 1,000 mcg, Intramuscular, Q30 days, Shawnee Knapp, MD, 1,000 mcg at 07/12/18 1652 Medication Side Effects: none  Family Medical/ Social History: Changes? no  MENTAL HEALTH EXAM:  There were no vitals taken for this visit.There is no height or weight on file to calculate BMI.  General Appearance: Casual no TD signs.  Eye Contact:  Good  Speech:  Normal Rate  Volume:  Normal  Mood:  Depressed  Affect:  Appropriate  Thought Process:  Linear  Orientation:  Full (Time, Place, and Person)  Thought Content: Logical   Suicidal Thoughts:  No  Homicidal Thoughts:   No  Memory:  WNL  Judgement:  Good  Insight:  Good  Psychomotor Activity:  Normal  Concentration:  Concentration: Good  Recall:  Good  Fund of Knowledge: Good  Language: Good  Assets:  Desire for Improvement  ADL's:  Intact  Cognition: WNL  Prognosis:  Good    DIAGNOSES:    ICD-10-CM   1. Bipolar disorder, in partial remission, most recent episode mixed ([place]) F31.77     Receiving Psychotherapy: No    RECOMMENDATIONS: pt is to get lamictal level. Will then decide on treatment. Consider increase lamictal or seroquel. Decrease trazadone to 200mg  hs. Xanax rx. Hand written.  Consider rexulti, saphris, fanapt, geodon, trilafon. Consider ginko biloba. Recheck 2 months per pt request.   Comer Locket, PA-C

## 2018-08-06 NOTE — Telephone Encounter (Signed)
Requested medication (s) are due for refill today: yes  Requested medication (s) are on the active medication list: yes  Last refill:  07/12/18  For 30 tabs  Future visit scheduled: yes  Notes to clinic:  B/p 144/62 on 07/19/18  Requested Prescriptions  Pending Prescriptions Disp Refills   carbidopa-levodopa (SINEMET IR) 25-100 MG tablet [Pharmacy Med Name: CARBIDOPA-LEVODOPA 25-100 TAB] 30 tablet 0    Sig: TAKE 1 TABLET BY MOUTH EVERYDAY AT BEDTIME     Neurology:  Parkinsonian Agents Failed - 08/04/2018 12:56 AM      Failed - Last BP in normal range    BP Readings from Last 1 Encounters:  07/19/18 (!) 144/62         Passed - Valid encounter within last 12 months    Recent Outpatient Visits          3 weeks ago Annual physical exam   [facility], Laurey Arrow, MD   4 months ago Obstructive sleep apnea   [facility], Laurey Arrow, MD   5 months ago Hypersomnia   [facility] at [facility], Arlie Solomons, MD   9 months ago Multinodular goiter   [facility], Laurey Arrow, MD   1 year ago Essential hypertension   [facility], Laurey Arrow, MD      Future Appointments            In 3 months Shawnee Knapp, MD Primary Care at [place], [facility]

## 2018-08-17 ENCOUNTER — Other Ambulatory Visit: Payer: Self-pay | Admitting: Family Medicine

## 2018-08-20 ENCOUNTER — Other Ambulatory Visit: Payer: Self-pay | Admitting: Family Medicine

## 2018-08-21 NOTE — Telephone Encounter (Signed)
Requested Prescriptions  Pending Prescriptions Disp Refills  . carbidopa-levodopa (SINEMET IR) 25-100 MG tablet [Pharmacy Med Name: CARBIDOPA-LEVODOPA 25-100 TAB] 90 tablet 1    Sig: TAKE 1 TABLET BY MOUTH EVERYDAY AT BEDTIME     Neurology:  Parkinsonian Agents Failed - 08/20/2018 12:27 PM      Failed - Last BP in normal range    BP Readings from Last 1 Encounters:  07/19/18 (!) 144/62         Passed - Valid encounter within last 12 months    Recent Outpatient Visits          1 month ago Annual physical exam   [facility], Laurey Arrow, MD   5 months ago Obstructive sleep apnea   [facility], Laurey Arrow, MD   6 months ago Hypersomnia   Primary Care at [facility], Arlie Solomons, MD   10 months ago Multinodular goiter   [facility] at [facility], Laurey Arrow, MD   1 year ago Essential hypertension   [facility] at [facility], Laurey Arrow, MD      Future Appointments            In 3 months Shawnee Knapp, MD Primary Care at [place], [facility]

## 2018-09-04 IMAGING — DX DG WRIST COMPLETE 3+V*L*
4 series · 4 of 4 positions shown · non-contrast
Comparison: None.

CLINICAL DATA: Left wrist pain after fall several months ago.

EXAM:
LEFT WRIST - COMPLETE 3+ VIEW

[wrist pa]
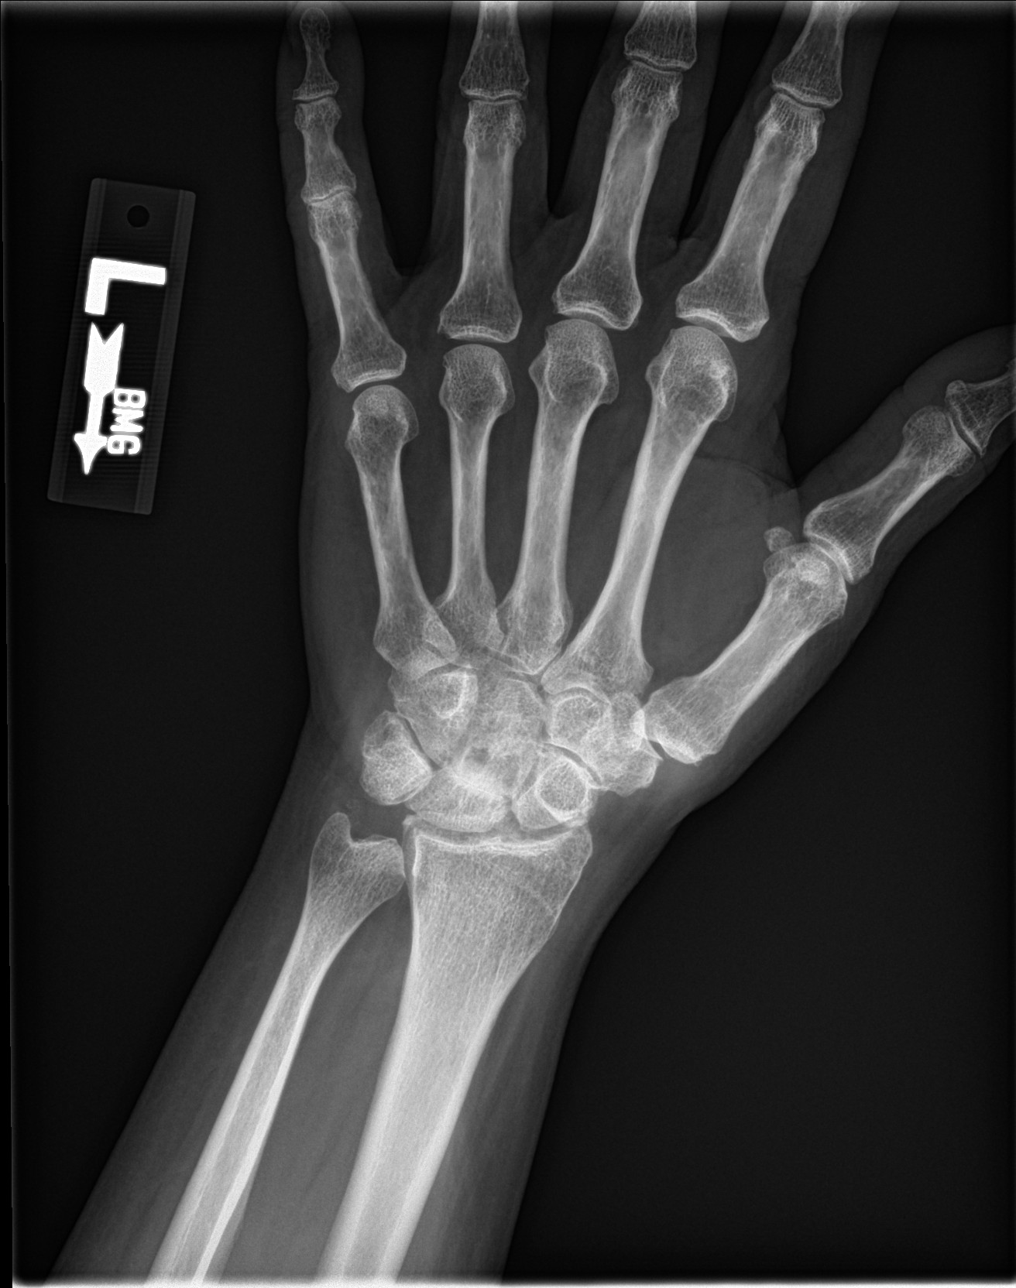

[wrist obl]
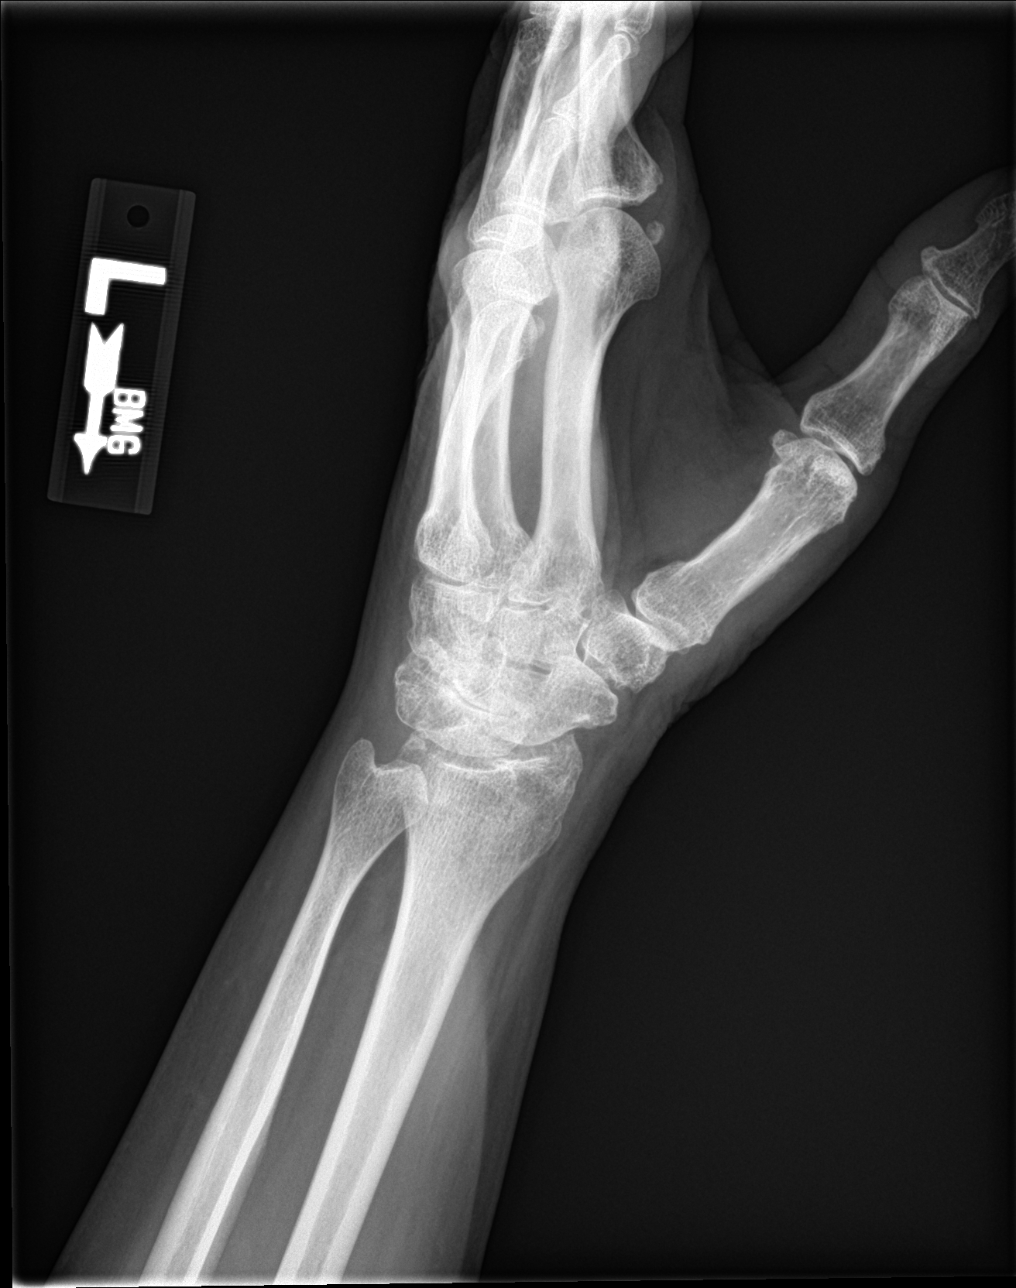

[wrist lat]
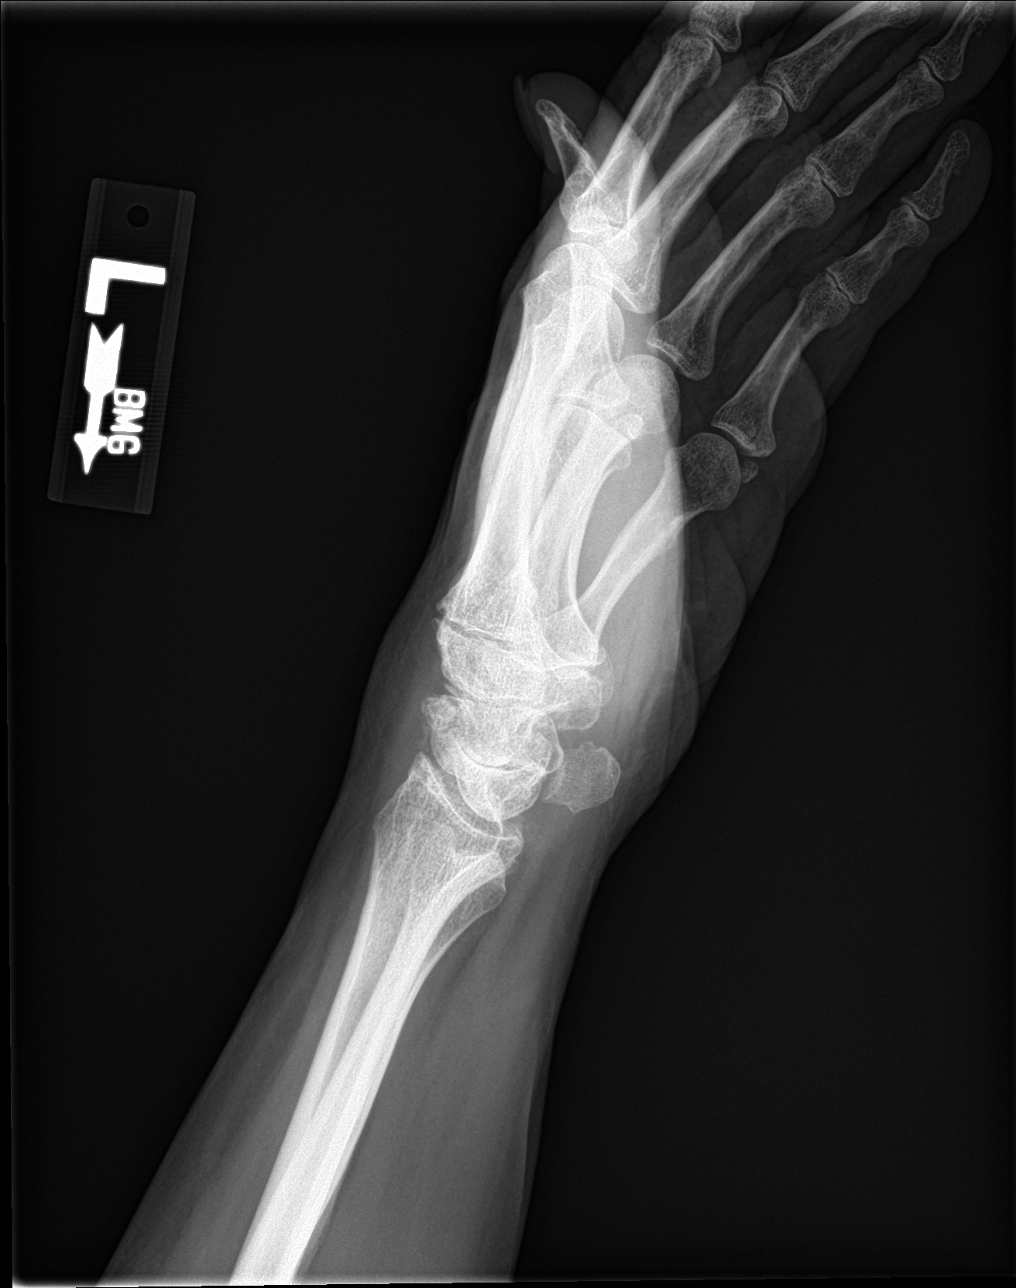

[wrist navicular]
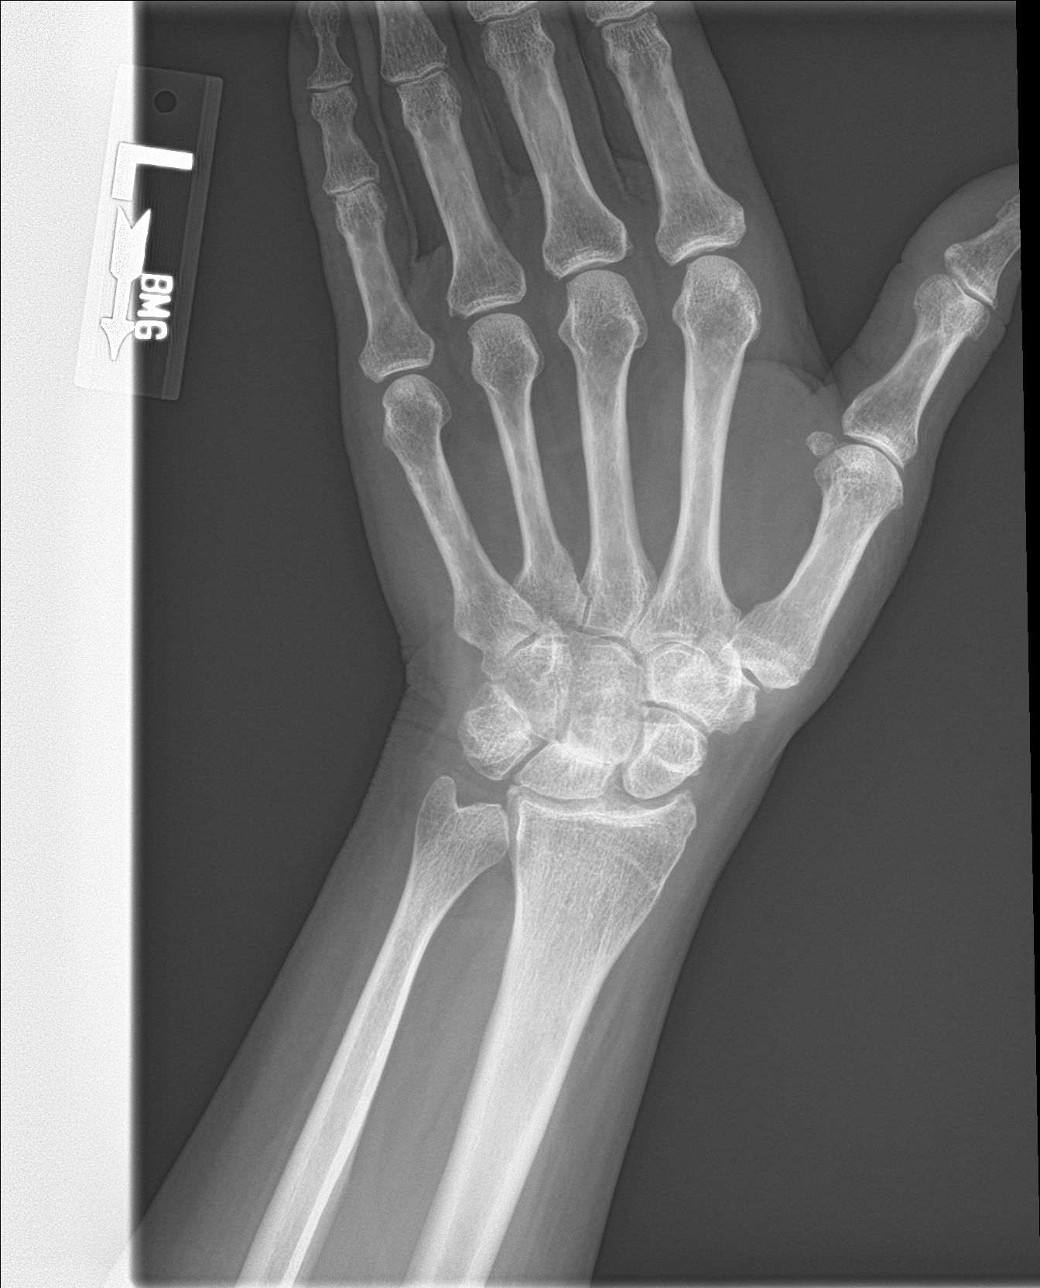

[4 of 4 positions shown; findings below may reference images not displayed]

FINDINGS: There is no evidence of fracture or dislocation. There is no
evidence of arthropathy or other focal bone abnormality. Soft
tissues are unremarkable.
IMPRESSION: No significant abnormality seen the left wrist.

## 2018-09-11 ENCOUNTER — Telehealth: Payer: Self-pay | Admitting: Family Medicine

## 2018-09-11 NOTE — Telephone Encounter (Unsigned)
Copied from [place] [id number]. Topic: Referral - Question >> Sep 10, 2018  5:52 PM Windy Kalata wrote: Reason for CRM: Patient would like Dr. Brigitte Pulse to make her a referral for an ENT doctor, please advise.  Best call back is [phone] cell

## 2018-09-11 NOTE — Telephone Encounter (Signed)
Copied from [place] [id number]. Topic: Referral - Question >> Sep 10, 2018  5:52 PM Windy Kalata wrote: Reason for CRM: Patient would like Dr. Brigitte Pulse to make her a referral for an ENT doctor, please advise.  Best call back is [phone] cell

## 2018-09-17 NOTE — Telephone Encounter (Signed)
Referral has been placed again today and pt should be receiving a call to schedule appointment.

## 2018-09-19 NOTE — Telephone Encounter (Signed)
Referral from 11/4 was sent to Dr. Lucia Gaskins on 1/14 as originally was sent to Dr. Benjamine Mola who does not trx OSA.

## 2018-09-19 NOTE — Telephone Encounter (Signed)
Pt can go to any other ENT - I don't have any preferrence. Looks like referral was sent to Dr. Lucia Gaskins 1/14.

## 2018-09-24 ENCOUNTER — Other Ambulatory Visit: Payer: Self-pay | Admitting: Psychiatry

## 2018-09-24 DIAGNOSIS — R1313 Dysphagia, pharyngeal phase: Secondary | ICD-10-CM | POA: Diagnosis not present

## 2018-09-24 DIAGNOSIS — R490 Dysphonia: Secondary | ICD-10-CM | POA: Diagnosis not present

## 2018-09-24 DIAGNOSIS — J310 Chronic rhinitis: Secondary | ICD-10-CM | POA: Diagnosis not present

## 2018-09-28 ENCOUNTER — Other Ambulatory Visit: Payer: Self-pay | Admitting: Psychiatry

## 2018-09-28 ENCOUNTER — Other Ambulatory Visit: Payer: Self-pay | Admitting: Family Medicine

## 2018-09-28 NOTE — Telephone Encounter (Signed)
Requested Prescriptions  Pending Prescriptions Disp Refills  . omeprazole (PRILOSEC) 40 MG capsule [Pharmacy Med Name: OMEPRAZOLE DR 40 MG CAPSULE] 90 capsule 0    Sig: TAKE 1 CAPSULE BY MOUTH EVERY DAY AS NEEDED FOR INDIGESTION OR HEARTBURN     Gastroenterology: Proton Pump Inhibitors Passed - 09/28/2018  1:04 AM      Passed - Valid encounter within last 12 months    Recent Outpatient Visits          2 months ago Annual physical exam   [facility] at [facility], Laurey Arrow, MD   6 months ago Obstructive sleep apnea   [facility] at [facility], Laurey Arrow, MD   7 months ago Hypersomnia   Primary Care at [facility], Arlie Solomons, MD   11 months ago Multinodular goiter   [facility] at [facility], Laurey Arrow, MD   1 year ago Essential hypertension   [facility] at [facility], Laurey Arrow, MD      Future Appointments            In 2 months Shawnee Knapp, MD Primary Care at [place], [facility]

## 2018-10-03 ENCOUNTER — Ambulatory Visit: Payer: PPO | Admitting: Psychiatry

## 2018-10-03 DIAGNOSIS — F3171 Bipolar disorder, in partial remission, most recent episode hypomanic: Secondary | ICD-10-CM

## 2018-10-03 MED ORDER — BUPROPION HCL ER (XL) 300 MG PO TB24: 300.0000 mg | ORAL_TABLET | Freq: Every day | ORAL | 0 refills | Status: DC

## 2018-10-03 MED ORDER — TRAZODONE HCL 100 MG PO TABS: 300.0000 mg | ORAL_TABLET | Freq: Every day | ORAL | 0 refills | Status: DC

## 2018-10-03 MED ORDER — LAMOTRIGINE 200 MG PO TABS: ORAL_TABLET | ORAL | 0 refills | Status: DC

## 2018-10-03 MED ORDER — HYDROXYZINE HCL 25 MG PO TABS: 50.0000 mg | ORAL_TABLET | Freq: Four times a day (QID) | ORAL | 0 refills | Status: DC | PRN

## 2018-10-03 MED ORDER — ALPRAZOLAM 1 MG PO TABS: ORAL_TABLET | ORAL | 0 refills | Status: DC

## 2018-10-03 NOTE — Progress Notes (Signed)
[facility]  Patient ID: Becky Lawson,  MRN: [id number]  PCP: Shawnee Knapp, MD  Date of Evaluation: 10/03/2018 Time spent:20 minutes  Chief Complaint:   HISTORY/CURRENT STATUS: HPI patient seen last Thanksgiving.  No meds were added as patient to get a Lamictal level. Currently her depression is better.  The anxiety is still not great. Patient has had 2 suicidal attempts the most recent one 10 years ago.  Individual Medical History/ Review of Systems: Changes? :No   Allergies: No known allergies  Current Medications:  Current Outpatient Medications:  .  ALPRAZolam (XANAX) 1 MG tablet, 1 hs and I prn, Disp: 110 tablet, Rfl: 0 .  buPROPion (WELLBUTRIN XL) 300 MG 24 hr tablet, Take 1 tablet (300 mg total) by mouth daily., Disp: 90 tablet, Rfl: 0 .  hydrOXYzine (ATARAX/VISTARIL) 25 MG tablet, Take 2 tablets (50 mg total) by mouth every 6 (six) hours as needed for anxiety or itching., Disp: 240 tablet, Rfl: 0 .  lamoTRIgine (LAMICTAL) 200 MG tablet, TAKE 1 TAB BY MOUTH TWICE A DAY, Disp: 180 tablet, Rfl: 0 .  traZODone (DESYREL) 100 MG tablet, Take 3 tablets (300 mg total) by mouth daily., Disp: 270 tablet, Rfl: 0 .  amLODipine (NORVASC) 10 MG tablet, Take 1 tablet (10 mg total) by mouth daily., Disp: 90 tablet, Rfl: 3 .  candesartan (ATACAND) 32 MG tablet, Take 1 tablet (32 mg total) by mouth daily., Disp: 90 tablet, Rfl: 1 .  carbidopa-levodopa (SINEMET IR) 25-100 MG tablet, TAKE 1 TABLET BY MOUTH EVERYDAY AT BEDTIME, Disp: 90 tablet, Rfl: 1 .  Cholecalciferol (VITAMIN D-3) 5000 units TABS, Take 5,000 Units daily by mouth. In addition to the rx high dose once weekly vitamin D of 50Ku., Disp: 30 tablet, Rfl:  .  Coenzyme Q10 (CO Q-10) 300 MG CAPS, Take 1 capsule daily by mouth., Disp: , Rfl:  .  docusate sodium (COLACE) 100 MG capsule, Take 1 capsule (100 mg total) by mouth 2 (two) times daily., Disp: 10 capsule, Rfl: 0 .  Ginkgo Biloba 40 MG TABS, Take 1 tablet daily by  mouth., Disp: , Rfl:  .  HYDROcodone-acetaminophen (NORCO) 10-325 MG tablet, Take 1 tablet by mouth 3 (three) times daily. , Disp: , Rfl:  .  levothyroxine (SYNTHROID, LEVOTHROID) 100 MCG tablet, Take 1 tablet (100 mcg total) by mouth daily before breakfast., Disp: 90 tablet, Rfl: 1 .  magnesium oxide (MAG-OX) 400 MG tablet, Take 400 mg by mouth daily., Disp: , Rfl:  .  Omega-3 Fatty Acids (FISH OIL) 1200 MG CAPS, Take by mouth., Disp: , Rfl:  .  omeprazole (PRILOSEC) 40 MG capsule, TAKE 1 CAPSULE BY MOUTH EVERY DAY AS NEEDED FOR INDIGESTION OR HEARTBURN, Disp: 90 capsule, Rfl: 0 .  pravastatin (PRAVACHOL) 40 MG tablet, Take 1 tablet (40 mg total) by mouth daily., Disp: 90 tablet, Rfl: 3 .  triamcinolone cream (KENALOG) 0.1 %, Apply 1 application topically 4 (four) times daily., Disp: 45 g, Rfl: 0 .  Vitamin D, Ergocalciferol, (DRISDOL) 50000 units CAPS capsule, Take 1 capsule (50,000 Units total) by mouth 2 (two) times a week., Disp: 26 capsule, Rfl: 1 .  vitamin E 600 UNIT capsule, Take 600 Units by mouth daily., Disp: , Rfl:   Current Facility-Administered Medications:  .  cyanocobalamin ((VITAMIN B-12)) injection 1,000 mcg, 1,000 mcg, Intramuscular, Q30 days, Shawnee Knapp, MD, 1,000 mcg at 07/12/18 1652 Medication Side Effects: none  Family Medical/ Social History: Changes?no  MENTAL HEALTH EXAM: Patient had  bilateral arm tremors.  She also has feet movement when she is distracted.  There were no vitals taken for this visit.There is no height or weight on file to calculate BMI.  General Appearance: Casual  Eye Contact:  Good  Speech:  Clear and Coherent  Volume:  Normal  Mood:  Anxious  Affect:  Appropriate  Thought Process:  Linear  Orientation:  Full (Time, Place, and Person)  Thought Content: Logical   Suicidal Thoughts:  No  Homicidal Thoughts:  No  Memory:  WNL  Judgement:  Good  Insight:  Good  Psychomotor Activity:  Normal  Concentration:  Concentration: Good  Recall:   Good  Fund of Knowledge: Good  Language: Good  Assets:  Desire for Improvement  ADL's:  Intact  Cognition: WNL  Prognosis:  Fair    DIAGNOSES:    ICD-10-CM   1. Bipolar disorder, in partial remission, most recent episode hypomanic ([place]) F31.71 Lamotrigine level    Receiving Psychotherapy: No    RECOMMENDATIONS: She wanted to keep her medications the same.  Have asked her to try to decrease trazodone to 200 mg a day.  Currently she takes 300 mg a day of the trazodone.  Patient requested not to add another medicine today.  She is to let me know if she has more episodes of being easily angered.  We may need to treat her for manic symptoms. Patient is to get a Lamictal level.  Patient is to restart ginkgo biloba.  He has bilateral hand tremors and feet movement. At next visit we will discuss the use of benzos and opioids. Patient return in 6 weeks.    Comer Locket, PA-C

## 2018-10-04 DIAGNOSIS — F3171 Bipolar disorder, in partial remission, most recent episode hypomanic: Secondary | ICD-10-CM | POA: Diagnosis not present

## 2018-10-05 ENCOUNTER — Other Ambulatory Visit: Payer: Self-pay | Admitting: Family Medicine

## 2018-10-07 LAB — LAMOTRIGINE LEVEL: Lamotrigine Lvl: 8.4 ug/mL (ref 4.0–18.0)

## 2018-10-17 DIAGNOSIS — M5410 Radiculopathy, site unspecified: Secondary | ICD-10-CM | POA: Diagnosis not present

## 2018-10-27 ENCOUNTER — Other Ambulatory Visit: Payer: Self-pay | Admitting: Psychiatry

## 2018-11-12 ENCOUNTER — Ambulatory Visit: Payer: PPO | Admitting: Psychiatry

## 2018-11-19 ENCOUNTER — Ambulatory Visit: Payer: PPO | Admitting: Psychiatry

## 2018-11-27 ENCOUNTER — Telehealth: Payer: Self-pay | Admitting: *Deleted

## 2018-11-27 NOTE — Telephone Encounter (Signed)
Can we call patient and schedule her for an appointment.  She states she is having some pain in her back, she was reluctant to schedule a Telemed visit.   Explained in detail about labs

## 2018-11-29 ENCOUNTER — Ambulatory Visit: Payer: Self-pay

## 2018-11-30 ENCOUNTER — Ambulatory Visit (INDEPENDENT_AMBULATORY_CARE_PROVIDER_SITE_OTHER): Payer: PPO | Admitting: Emergency Medicine

## 2018-11-30 ENCOUNTER — Ambulatory Visit: Payer: PPO | Admitting: Psychiatry

## 2018-11-30 ENCOUNTER — Other Ambulatory Visit: Payer: Self-pay

## 2018-11-30 DIAGNOSIS — R109 Unspecified abdominal pain: Secondary | ICD-10-CM | POA: Diagnosis not present

## 2018-11-30 NOTE — Progress Notes (Signed)
Telemedicine Encounter- SOAP NOTE Established Patient  This telephone encounter was conducted with the patient's (or proxy's) verbal consent via audio telecommunications: yes/no: Yes Patient was instructed to have this encounter in a suitably private space; and to only have persons present to whom they give permission to participate. In addition, patient identity was confirmed by use of name plus two identifiers (DOB and address).  I discussed the limitations, risks, security and privacy concerns of performing an evaluation and management service by telephone and the availability of in person appointments. I also discussed with the patient that there may be a patient responsible charge related to this service. The patient expressed understanding and agreed to proceed.  I spent a total of TIME; 0 MIN TO 60 MIN: 15 minutes talking with the patient or their proxy.  Chief Complaint  Patient presents with  . right side pain    behind 2nd rib to waist line pain. pain off and on for 4 month worse in the last couple of weeks.     Subjective   Becky Lawson is a 68 y.o. female established patient. Telephone visit today for right flank pain that started 4 months ago but worse during the past 2 weeks.  Intermittent sharp pains that last 1 to 2 hours.  No radiation.  Still able to eat and drink.  No nausea or vomiting.  Denies fever or chills.  Denies urinary symptoms.  No history of kidney stones or gallstones.  Had a normal abdominal ultrasound done on 11/29/2016.  Has history of spinal stenosis and takes hydrocodone as needed but this pain feels different.  Denies injury or any type of trauma.  No other significant symptoms.  HPI   Patient Active Problem List   Diagnosis Date Noted  . Tremor 07/12/2018  . Prediabetes 07/12/2018  . Polypharmacy 07/12/2018  . Unintentional weight loss of more than 10 pounds 07/12/2018  . OSA (obstructive sleep apnea) 06/02/2018  . Fatty liver 04/18/2017  . GERD  (gastroesophageal reflux disease) 10/28/2016  . 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 Cough 06/24/2016  . S/P partial thyroidectomy 06/16/2016  . Multinodular goiter 06/02/2016  . Thyroid nodule 02/08/2016  . History of hepatitis C 05/19/2015  . Vitamin D deficiency 05/19/2015  . Spinal stenosis of lumbar region 05/19/2015  . Hyperlipidemia 05/19/2015  . Bipolar disorder ([place]) 05/19/2015  . Essential hypertension 09/26/2013  . Undiagnosed cardiac murmurs 09/26/2013    Past Medical History:  Diagnosis Date  . Anxiety   . Arthritis   . Benign cyst of right kidney 2014   2.7 cm right kidney cyst seen on imaging 2014 but was c/o right flank pain with new persistent proteinuria (fortunately hematuria had resolved) so repeat US 11/2016 showed right kidney still with 2.6 cm simple cyst and otherwise nml.  . Bipolar 1 disorder ([place])   . Chronic kidney disease   . Depression   . GERD (gastroesophageal reflux disease)   . Heart murmur   . Hepatitis C    resolved completely after treatment in 2014, genotype 1b, followed at [facility]  . Hyperlipidemia   . Hypertension   . Jaundice 11/01/2012  . Pruritic disorder 02/13/2013  . Sleep apnea    Was diagnosed approximately 20 years ago, but does not wear CPAP patient stated "I gave it back.Marland KitchenMarland KitchenI couldn't wear that thing it was horrible"  . Spinal stenosis     Current Outpatient Medications  Medication Sig Dispense Refill  . ALPRAZolam (XANAX) 1 MG tablet 1 hs  and I prn 110 tablet 0  . amLODipine (NORVASC) 10 MG tablet Take 1 tablet (10 mg total) by mouth daily. 90 tablet 3  . buPROPion (WELLBUTRIN XL) 300 MG 24 hr tablet Take 1 tablet (300 mg total) by mouth daily. 90 tablet 0  . candesartan (ATACAND) 32 MG tablet Take 1 tablet (32 mg total) by mouth daily. 90 tablet 1  . carbidopa-levodopa (SINEMET IR) 25-100 MG tablet TAKE 1 TABLET BY MOUTH EVERYDAY AT BEDTIME 90 tablet 1  . Cholecalciferol (VITAMIN D-3) 5000 units TABS Take 5,000 Units daily  by mouth. In addition to the rx high dose once weekly vitamin D of 50Ku. 30 tablet   . HYDROcodone-acetaminophen (NORCO) 10-325 MG tablet Take 1 tablet by mouth 3 (three) times daily.     . hydrOXYzine (ATARAX/VISTARIL) 25 MG tablet Take 2 tablets (50 mg total) by mouth every 6 (six) hours as needed for anxiety or itching. 240 tablet 0  . lamoTRIgine (LAMICTAL) 200 MG tablet TAKE 1 TAB BY MOUTH TWICE A DAY 180 tablet 0  . levothyroxine (SYNTHROID, LEVOTHROID) 100 MCG tablet Take 1 tablet (100 mcg total) by mouth daily before breakfast. 90 tablet 1  . magnesium oxide (MAG-OX) 400 MG tablet Take 400 mg by mouth daily.    [name] omeprazole (PRILOSEC) 40 MG capsule TAKE 1 CAPSULE BY MOUTH EVERY DAY AS NEEDED FOR INDIGESTION OR HEARTBURN 90 capsule 0  . pravastatin (PRAVACHOL) 40 MG tablet Take 1 tablet (40 mg total) by mouth daily. 90 tablet 3  . traZODone (DESYREL) 100 MG tablet Take 3 tablets (300 mg total) by mouth daily. 270 tablet 0  . triamcinolone cream (KENALOG) 0.1 % Apply 1 application topically 4 (four) times daily. 45 g 0  . Vitamin D, Ergocalciferol, (DRISDOL) 1.25 MG (50000 UT) CAPS capsule TAKE 1 CAPSULE (50,000 UNITS TOTAL) BY MOUTH 2 (TWO) TIMES A WEEK. 26 capsule 1  . vitamin E 600 UNIT capsule Take 600 Units by mouth daily.    . Coenzyme Q10 (CO Q-10) 300 MG CAPS Take 1 capsule daily by mouth.    . docusate sodium (COLACE) 100 MG capsule Take 1 capsule (100 mg total) by mouth 2 (two) times daily. (Patient not taking: Reported on 11/30/2018) 10 capsule 0  . Ginkgo Biloba 40 MG TABS Take 1 tablet daily by mouth.    . Omega-3 Fatty Acids (FISH OIL) 1200 MG CAPS Take by mouth.     Current Facility-Administered Medications  Medication Dose Route Frequency Provider Last Rate Last Dose  . cyanocobalamin ((VITAMIN B-12)) injection 1,000 mcg  1,000 mcg Intramuscular Q30 days Shawnee Knapp, MD   1,000 mcg at 07/12/18 1652    Allergies  Allergen Reactions  . No Known Allergies     Social  History   Socioeconomic History  . Marital status: Divorced    Spouse name: Not on file  . Number of children: Not on file  . Years of education: Not on file  . Highest education level: Not on file  Occupational History  . Occupation: Geophysical data processor  Social Needs  . Financial resource strain: Not on file  . Food insecurity:    Worry: Not on file    Inability: Not on file  . Transportation needs:    Medical: Not on file    Non-medical: Not on file  Tobacco Use  . Smoking status: Former Smoker    Packs/day: 1.00    Years: 15.00    Pack years: 15.00  Last attempt to quit: 06/30/1979    Years since quitting: 39.4  . Smokeless tobacco: Never Used  Substance and Sexual Activity  . Alcohol use: Yes    Alcohol/week: 1.0 standard drinks    Types: 1 Standard drinks or equivalent per week    Comment: 1 glass of red wine daily  . Drug use: No  . Sexual activity: Yes    Birth control/protection: None  Lifestyle  . Physical activity:    Days per week: Not on file    Minutes per session: Not on file  . Stress: Not on file  Relationships  . Social connections:    Talks on phone: Not on file    Gets together: Not on file    Attends religious service: Not on file    Active member of club or organization: Not on file    Attends meetings of clubs or organizations: Not on file    Relationship status: Not on file  . Intimate partner violence:    Fear of current or ex partner: Not on file    Emotionally abused: Not on file    Physically abused: Not on file    Forced sexual activity: Not on file  Other Topics Concern  . Not on file  Social History Narrative   Divorced. Education: [employer]. Exercise: No.    Review of Systems  Constitutional: Negative for chills and fever.  HENT: Negative for sore throat.   Eyes: Negative.   Respiratory: Negative for cough and shortness of breath.   Cardiovascular: Negative for chest pain and palpitations.  Gastrointestinal: Negative for  abdominal pain, blood in stool, diarrhea, nausea and vomiting.  Genitourinary: Positive for flank pain. Negative for dysuria, frequency and hematuria.  Musculoskeletal: Negative for myalgias.  Skin: Negative.  Negative for rash.  Neurological: Negative.  Negative for dizziness and headaches.  Endo/Heme/Allergies: Negative.   All other systems reviewed and are negative.   Objective   Vitals as reported by the patient: None available There were no vitals filed for this visit.  Stesha was seen today for right side pain.  Diagnoses and all orders for this visit:  Flank pain -     CT Abdomen Pelvis W Contrast; Future -     CBC with Differential/Platelet -     Comprehensive metabolic panel -     Urine Culture; Future  Large differential diagnosis.  Patient needs office visit tomorrow or early next week. Also needs blood work and urine examination. Needs diagnostic imaging.  Will request abdomen and pelvis CT scanning. Advised to monitor symptoms and take analgesics as needed. ED precautions given.   I discussed the assessment and treatment plan with the patient. The patient was provided an opportunity to ask questions and all were answered. The patient agreed with the plan and demonstrated an understanding of the instructions.   The patient was advised to call back or seek an in-person evaluation if the symptoms worsen or if the condition fails to improve as anticipated.  I provided 15 minutes of non-face-to-face time during this encounter.  Horald Pollen, MD  Primary Care at [facility]

## 2018-11-30 NOTE — Patient Instructions (Signed)
° ° ° °  If you have lab work done today you will be contacted with your lab results within the next 2 weeks.  If you have not heard from us then please contact us. The fastest way to get your results is to register for My Chart. ° ° °IF you received an x-ray today, you will receive an invoice from [facility]. Please contact [facility] at [phone] with questions or concerns regarding your invoice.  ° °IF you received labwork today, you will receive an invoice from [facility]. Please contact [facility] at [phone] with questions or concerns regarding your invoice.  ° °Our billing staff will not be able to assist you with questions regarding bills from these companies. ° °You will be contacted with the lab results as soon as they are available. The fastest way to get your results is to activate your My Chart account. Instructions are located on the last page of this paperwork. If you have not heard from us regarding the results in 2 weeks, please contact this office. °  ° ° ° °

## 2018-11-30 NOTE — Addendum Note (Signed)
Addended by: Kittie Plater, Djibril Glogowski HUA on: 11/30/2018 05:17 PM   Modules accepted: Orders

## 2018-12-04 ENCOUNTER — Ambulatory Visit (INDEPENDENT_AMBULATORY_CARE_PROVIDER_SITE_OTHER): Payer: PPO | Admitting: Physician Assistant

## 2018-12-04 ENCOUNTER — Encounter: Payer: Self-pay | Admitting: Physician Assistant

## 2018-12-04 ENCOUNTER — Other Ambulatory Visit: Payer: Self-pay

## 2018-12-04 DIAGNOSIS — F3175 Bipolar disorder, in partial remission, most recent episode depressed: Secondary | ICD-10-CM

## 2018-12-04 DIAGNOSIS — F411 Generalized anxiety disorder: Secondary | ICD-10-CM

## 2018-12-04 NOTE — Progress Notes (Signed)
[facility]  Patient ID: Becky Lawson,  MRN: [id number]  PCP: Shawnee Knapp, MD  Date of Evaluation: 12/04/2018 Time spent:15 minutes  Chief Complaint:  Chief Complaint    Follow-up     Virtual Visit via Telephone Note  I connected with Becky Lawson on 12/04/18 at  1:00 PM EDT by telephone and verified that I am speaking with the correct person using two identifiers.   I discussed the limitations, risks, security and privacy concerns of performing an evaluation and management service by telephone and the availability of in person appointments. I also discussed with the patient that there may be a patient responsible charge related to this service. The patient expressed understanding and agreed to proceed.    HISTORY/CURRENT STATUS: HPI for 37-month med check.  I am seeing patient in the absence of [place], [place].  States she is doing pretty well. Patient denies loss of interest in usual activities and is able to enjoy things.  Denies decreased energy or motivation.  Appetite has not changed.  No extreme sadness, tearfulness, or feelings of hopelessness.  Denies any changes in concentration, making decisions or remembering things.  Denies suicidal or homicidal thoughts.  Patient denies increased energy with decreased need for sleep, no increased talkativeness, no racing thoughts, no impulsivity or risky behaviors, no increased spending, no increased libido, no grandiosity.  Denies hallucinations.  She is doing a lot of cleaning at her house and backing things up to give away.  She is having no other symptoms of mania however and feels like this is not related to the bipolar disorder.  She is having some anxiety off and on.  The Xanax does help.  She continues to take 1 every night and then a couple of times a month, she might need an extra Xanax.  She also takes the hydroxyzine several times a week to help with the anxiety.  Of course right now with the coronavirus pandemic, she  has been a little bit more anxious but again controlled with the medications.  Denies muscle or joint pain, stiffness, or dystonia.  Denies dizziness, syncope, seizures, numbness, tingling, tremor, tics, unsteady gait, slurred speech, confusion.   Individual Medical History/ Review of Systems: Changes? :No    Past medications for mental health diagnoses include: unknown  Allergies: No known allergies  Current Medications:  Current Outpatient Medications:  .  ALPRAZolam (XANAX) 1 MG tablet, 1 hs and I prn, Disp: 110 tablet, Rfl: 0 .  amLODipine (NORVASC) 10 MG tablet, Take 1 tablet (10 mg total) by mouth daily., Disp: 90 tablet, Rfl: 3 .  buPROPion (WELLBUTRIN XL) 300 MG 24 hr tablet, Take 1 tablet (300 mg total) by mouth daily., Disp: 90 tablet, Rfl: 0 .  candesartan (ATACAND) 32 MG tablet, Take 1 tablet (32 mg total) by mouth daily., Disp: 90 tablet, Rfl: 1 .  carbidopa-levodopa (SINEMET IR) 25-100 MG tablet, TAKE 1 TABLET BY MOUTH EVERYDAY AT BEDTIME, Disp: 90 tablet, Rfl: 1 .  Cholecalciferol (VITAMIN D-3) 5000 units TABS, Take 5,000 Units daily by mouth. In addition to the rx high dose once weekly vitamin D of 50Ku., Disp: 30 tablet, Rfl:  .  Coenzyme Q10 (CO Q-10) 300 MG CAPS, Take 1 capsule daily by mouth., Disp: , Rfl:  .  docusate sodium (COLACE) 100 MG capsule, Take 1 capsule (100 mg total) by mouth 2 (two) times daily., Disp: 10 capsule, Rfl: 0 .  Ginkgo Biloba 40 MG TABS, Take 1 tablet daily by  mouth., Disp: , Rfl:  .  HYDROcodone-acetaminophen (NORCO) 10-325 MG tablet, Take 1 tablet by mouth 3 (three) times daily. , Disp: , Rfl:  .  hydrOXYzine (ATARAX/VISTARIL) 25 MG tablet, Take 2 tablets (50 mg total) by mouth every 6 (six) hours as needed for anxiety or itching., Disp: 240 tablet, Rfl: 0 .  lamoTRIgine (LAMICTAL) 200 MG tablet, TAKE 1 TAB BY MOUTH TWICE A DAY, Disp: 180 tablet, Rfl: 0 .  levothyroxine (SYNTHROID, LEVOTHROID) 100 MCG tablet, Take 1 tablet (100 mcg total) by  mouth daily before breakfast., Disp: 90 tablet, Rfl: 1 .  magnesium oxide (MAG-OX) 400 MG tablet, Take 400 mg by mouth daily., Disp: , Rfl:  .  Omega-3 Fatty Acids (FISH OIL) 1200 MG CAPS, Take by mouth., Disp: , Rfl:  .  pravastatin (PRAVACHOL) 40 MG tablet, Take 1 tablet (40 mg total) by mouth daily., Disp: 90 tablet, Rfl: 3 .  traZODone (DESYREL) 100 MG tablet, Take 3 tablets (300 mg total) by mouth daily., Disp: 270 tablet, Rfl: 0 .  Vitamin D, Ergocalciferol, (DRISDOL) 1.25 MG (50000 UT) CAPS capsule, TAKE 1 CAPSULE (50,000 UNITS TOTAL) BY MOUTH 2 (TWO) TIMES A WEEK., Disp: 26 capsule, Rfl: 1 .  vitamin E 600 UNIT capsule, Take 600 Units by mouth daily., Disp: , Rfl:  .  omeprazole (PRILOSEC) 40 MG capsule, TAKE 1 CAPSULE BY MOUTH EVERY DAY AS NEEDED FOR INDIGESTION OR HEARTBURN, Disp: 90 capsule, Rfl: 0 .  triamcinolone cream (KENALOG) 0.1 %, Apply 1 application topically 4 (four) times daily. (Patient not taking: Reported on 12/04/2018), Disp: 45 g, Rfl: 0  Current Facility-Administered Medications:  .  cyanocobalamin ((VITAMIN B-12)) injection 1,000 mcg, 1,000 mcg, Intramuscular, Q30 days, Shawnee Knapp, MD, 1,000 mcg at 07/12/18 1652 Medication Side Effects: none  Family Medical/ Social History: Changes? No  MENTAL HEALTH EXAM:  There were no vitals taken for this visit.There is no height or weight on file to calculate BMI.  General Appearance: Phone visit so unable to assess  Eye Contact:  Unable to assess  Speech:  Clear and Coherent  Volume:  Normal  Mood:  Euthymic  Affect:  unable to assess  Thought Process:  Goal Directed  Orientation:  Full (Time, Place, and Person)  Thought Content: Logical   Suicidal Thoughts:  No  Homicidal Thoughts:  No  Memory:  WNL  Judgement:  Good  Insight:  Good  Psychomotor Activity:  unable to see assess  Concentration:  Concentration: Good  Recall:  Good  Fund of Knowledge: Good  Language: Good  Assets:  Desire for Improvement  ADL's:   Intact  Cognition: WNL  Prognosis:  Good  Lamictal level on 10/04/2018 was 8.4  DIAGNOSES:    ICD-10-CM   1. Bipolar disorder, in partial remission, most recent episode depressed ([place]) F31.75   2. Generalized anxiety disorder F41.1     Receiving Psychotherapy: No    RECOMMENDATIONS:  Continue Xanax 1 mg p.o. nightly as needed as well as 1 mg extra on occasion throughout the day as needed.  PDMP reviewed. Continue Wellbutrin XL 300 mg daily. Continue hydroxyzine 25 mg 1-2 every 6 hours as needed anxiety. Continue Lamictal 200 mg 1 twice daily. Continue trazodone 100 mg, 1-3 nightly as needed sleep. Continue supplements of ginkgo biloba, vitamin D, omega-3 fatty acids, magnesium, co-Q10. Return in 2 months or sooner as needed.  Donnal Moat, PA-C   This record has been created using [employer].  Chart creation errors have been  sought, but may not always have been located and corrected. Such creation errors do not reflect on the standard of medical care.

## 2018-12-05 ENCOUNTER — Encounter: Payer: Self-pay | Admitting: Emergency Medicine

## 2018-12-05 ENCOUNTER — Other Ambulatory Visit: Payer: Self-pay

## 2018-12-05 ENCOUNTER — Ambulatory Visit (INDEPENDENT_AMBULATORY_CARE_PROVIDER_SITE_OTHER): Payer: PPO | Admitting: Emergency Medicine

## 2018-12-05 VITALS — BP 133/65 | HR 91 | Temp 98.7°F | Resp 16 | Ht 64.0 in | Wt 175.0 lb

## 2018-12-05 DIAGNOSIS — R1909 Other intra-abdominal and pelvic swelling, mass and lump: Secondary | ICD-10-CM | POA: Diagnosis not present

## 2018-12-05 DIAGNOSIS — R05 Cough: Secondary | ICD-10-CM | POA: Diagnosis not present

## 2018-12-05 DIAGNOSIS — R109 Unspecified abdominal pain: Secondary | ICD-10-CM | POA: Diagnosis not present

## 2018-12-05 DIAGNOSIS — R0981 Nasal congestion: Secondary | ICD-10-CM | POA: Diagnosis not present

## 2018-12-05 DIAGNOSIS — J0100 Acute maxillary sinusitis, unspecified: Secondary | ICD-10-CM | POA: Diagnosis not present

## 2018-12-05 LAB — POCT URINALYSIS DIP (MANUAL ENTRY)
Bilirubin, UA: NEGATIVE
Glucose, UA: NEGATIVE mg/dL
Ketones, POC UA: NEGATIVE mg/dL
Leukocytes, UA: NEGATIVE
Nitrite, UA: NEGATIVE
Protein Ur, POC: NEGATIVE mg/dL
Spec Grav, UA: 1.01 (ref 1.010–1.025)
Urobilinogen, UA: 0.2 E.U./dL
pH, UA: 6 (ref 5.0–8.0)

## 2018-12-05 NOTE — Patient Instructions (Addendum)
     If you have lab work done today you will be contacted with your lab results within the next 2 weeks.  If you have not heard from [place] then please contact us. The fastest way to get your results is to register for My Chart.   IF you received an x-ray today, you will receive an invoice from [facility]. Please contact [facility] at [phone] with questions or concerns regarding your invoice.   IF you received labwork today, you will receive an invoice from [facility]. Please contact [facility] at [phone] with questions or concerns regarding your invoice.   Our billing staff will not be able to assist you with questions regarding bills from these companies.  You will be contacted with the lab results as soon as they are available. The fastest way to get your results is to activate your My Chart account. Instructions are located on the last page of this paperwork. If you have not heard from [place] regarding the results in 2 weeks, please contact this office.     Flank Pain, Adult Flank pain is pain in your side. The flank is the area of your side between your upper belly (abdomen) and your back. The pain may occur over a short time (acute), or it may be long-term or come back often (chronic). It may be mild or very bad. Pain in this area can be caused by many different things. Follow these instructions at home:   Drink enough fluid to keep your pee (urine) clear or pale yellow.  Rest as told by your doctor.  Take over-the-counter and prescription medicines only as told by your doctor.  Keep a journal to keep track of: ? What has caused your flank pain. ? What has made it feel better.  Keep all follow-up visits as told by your doctor. This is important. Contact a doctor if:  Medicine does not help your pain.  You have new symptoms.  Your pain gets worse.  You have a fever.  Your symptoms last longer than 2-3 days.  You have trouble peeing.  You are  peeing more often than normal. Get help right away if:  You have trouble breathing.  You are short of breath.  Your belly hurts, or it is swollen or red.  You feel sick to your stomach (nauseous).  You throw up (vomit).  You feel like you will pass out, or you do pass out (faint).  You have blood in your pee. Summary  Flank pain is pain in your side. The flank is the area of your side between your upper belly (abdomen) and your back.  Flank pain may occur over a short time (acute), or it may be long-term or come back often (chronic). It may be mild or very bad.  Pain in this area can be caused by many different things.  Contact your doctor if your symptoms get worse or they last longer than 2-3 days. This information is not intended to replace advice given to you by your health care provider. Make sure you discuss any questions you have with your health care provider. Document Released: 05/31/2008 Document Revised: 12/12/2016 Document Reviewed: 12/12/2016 Elsevier Interactive Patient Education  2019 [employer].

## 2018-12-05 NOTE — Progress Notes (Signed)
Becky Lawson 68 y.o.   Chief Complaint  Patient presents with  . Flank Pain    RIGHT per patient it has gotten worse - intermittent    HISTORY OF PRESENT ILLNESS: This is a 68 y.o. female complaining of constant right flank pain for the past several weeks.  Denies injuries.  Denies urinary symptoms.  Denies GI symptoms.  Denies fever or chills.  Able to eat and drink okay.  Denies nausea or vomiting.  No other significant symptoms.  Has a history of thyroid cancer.  Concerned about her kidneys.  No other significant symptoms.  Scheduled for CT scan of abdomen and pelvis tomorrow.  HPI   Prior to Admission medications   Medication Sig Start Date End Date Taking? Authorizing Provider  ALPRAZolam Duanne Moron) 1 MG tablet 1 hs and I prn 10/03/18  Yes Shugart, [name], PA-C  amLODipine (NORVASC) 10 MG tablet Take 1 tablet (10 mg total) by mouth daily. 07/12/18  Yes Shawnee Knapp, MD  buPROPion (WELLBUTRIN XL) 300 MG 24 hr tablet Take 1 tablet (300 mg total) by mouth daily. 10/03/18  Yes Shugart, [name], PA-C  candesartan (ATACAND) 32 MG tablet Take 1 tablet (32 mg total) by mouth daily. 07/12/18  Yes Shawnee Knapp, MD  carbidopa-levodopa (SINEMET IR) 25-100 MG tablet TAKE 1 TABLET BY MOUTH EVERYDAY AT BEDTIME 08/21/18  Yes Shawnee Knapp, MD  Cholecalciferol (VITAMIN D-3) 5000 units TABS Take 5,000 Units daily by mouth. In addition to the rx high dose once weekly vitamin D of 50Ku. 07/24/17  Yes Shawnee Knapp, MD  Coenzyme Q10 (CO Q-10) 300 MG CAPS Take 1 capsule daily by mouth.   Yes [provider]  docusate sodium (COLACE) 100 MG capsule Take 1 capsule (100 mg total) by mouth 2 (two) times daily. 05/13/17  Yes Shawnee Knapp, MD  Ginkgo Biloba 40 MG TABS Take 1 tablet daily by mouth.   Yes [provider]  HYDROcodone-acetaminophen (NORCO) 10-325 MG tablet Take 1 tablet by mouth 3 (three) times daily.    Yes [provider]  hydrOXYzine (ATARAX/VISTARIL) 25 MG tablet Take 2 tablets (50 mg  total) by mouth every 6 (six) hours as needed for anxiety or itching. 10/03/18  Yes Shugart, [name], PA-C  lamoTRIgine (LAMICTAL) 200 MG tablet TAKE 1 TAB BY MOUTH TWICE A DAY 10/03/18  Yes Shugart, [name], PA-C  levothyroxine (SYNTHROID, LEVOTHROID) 100 MCG tablet Take 1 tablet (100 mcg total) by mouth daily before breakfast. 07/12/18  Yes Shawnee Knapp, MD  magnesium oxide (MAG-OX) 400 MG tablet Take 400 mg by mouth daily.   Yes [provider]  Omega-3 Fatty Acids (FISH OIL) 1200 MG CAPS Take by mouth.   Yes [provider]  omeprazole (PRILOSEC) 40 MG capsule TAKE 1 CAPSULE BY MOUTH EVERY DAY AS NEEDED FOR INDIGESTION OR HEARTBURN 09/28/18  Yes Shawnee Knapp, MD  pravastatin (PRAVACHOL) 40 MG tablet Take 1 tablet (40 mg total) by mouth daily. 07/12/18  Yes Shawnee Knapp, MD  traZODone (DESYREL) 100 MG tablet Take 3 tablets (300 mg total) by mouth daily. 10/03/18  Yes Shugart, [name], PA-C  triamcinolone cream (KENALOG) 0.1 % Apply 1 application topically 4 (four) times daily. 07/12/18  Yes Shawnee Knapp, MD  Vitamin D, Ergocalciferol, (DRISDOL) 1.25 MG (50000 UT) CAPS capsule TAKE 1 CAPSULE (50,000 UNITS TOTAL) BY MOUTH 2 (TWO) TIMES A WEEK. 10/08/18  Yes Shawnee Knapp, MD  vitamin E 600 UNIT capsule Take 600 Units  by mouth daily.   Yes [provider]    Allergies  Allergen Reactions  . No Known Allergies     Patient Active Problem List   Diagnosis Date Noted  . Tremor 07/12/2018  . Prediabetes 07/12/2018  . Polypharmacy 07/12/2018  . Unintentional weight loss of more than 10 pounds 07/12/2018  . OSA (obstructive sleep apnea) 06/02/2018  . Fatty liver 04/18/2017  . GERD (gastroesophageal reflux disease) 10/28/2016  . 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 Cough 06/24/2016  . S/P partial thyroidectomy 06/16/2016  . Multinodular goiter 06/02/2016  . Thyroid nodule 02/08/2016  . History of hepatitis C 05/19/2015  . Vitamin D deficiency 05/19/2015  . Spinal stenosis of lumbar  region 05/19/2015  . Hyperlipidemia 05/19/2015  . Bipolar disorder ([place]) 05/19/2015  . Essential hypertension 09/26/2013  . Undiagnosed cardiac murmurs 09/26/2013    Past Medical History:  Diagnosis Date  . Anxiety   . Arthritis   . Benign cyst of right kidney 2014   2.7 cm right kidney cyst seen on imaging 2014 but was c/o right flank pain with new persistent proteinuria (fortunately hematuria had resolved) so repeat US 11/2016 showed right kidney still with 2.6 cm simple cyst and otherwise nml.  . Bipolar 1 disorder ([place])   . Chronic kidney disease   . Depression   . GERD (gastroesophageal reflux disease)   . Heart murmur   . 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 Hyperlipidemia   . Hypertension   . Jaundice 11/01/2012  . Pruritic disorder 02/13/2013  . Sleep apnea    Was diagnosed approximately 20 years ago, but does not wear CPAP patient stated "I gave it back.Marland KitchenMarland KitchenI couldn't wear that thing it was horrible"  . Spinal stenosis     Past Surgical History:  Procedure Laterality Date  . ABDOMINAL HYSTERECTOMY    . COLONOSCOPY W/ POLYPECTOMY    . JOINT REPLACEMENT Bilateral    knee  . KNEE ARTHROSCOPY Bilateral   . PARTIAL HYSTERECTOMY    . THYROID LOBECTOMY Left 06/16/2016  . THYROID LOBECTOMY Left 06/16/2016   Procedure: LEFT THYROID LOBECTOMY;  Surgeon: Greer Pickerel, MD;  Location: [facility];  Service: General;  Laterality: Left;    Social History   Socioeconomic History  . Marital status: Divorced    Spouse name: Not on file  . Number of children: Not on file  . Years of education: Not on file  . Highest education level: Not on file  Occupational History  . Occupation: Geophysical data processor  Social Needs  . Financial resource strain: Not on file  . Food insecurity:    Worry: Not on file    Inability: Not on file  . Transportation needs:    Medical: Not on file    Non-medical: Not on file  Tobacco Use  . Smoking  status: Former Smoker    Packs/day: 1.00    Years: 15.00    Pack years: 15.00    Last attempt to quit: 06/30/1979    Years since quitting: 39.4  . Smokeless tobacco: Never Used  Substance and Sexual Activity  . Alcohol use: Yes    Alcohol/week: 1.0 standard drinks    Types: 1 Standard drinks or equivalent per week    Comment: every few months  . Drug use: No  . Sexual activity: Yes    Birth control/protection: None  Lifestyle  . Physical activity:    Days per week: Not on  file    Minutes per session: Not on file  . Stress: Not on file  Relationships  . Social connections:    Talks on phone: Not on file    Gets together: Not on file    Attends religious service: Not on file    Active member of club or organization: Not on file    Attends meetings of clubs or organizations: Not on file    Relationship status: Not on file  . Intimate partner violence:    Fear of current or ex partner: Not on file    Emotionally abused: Not on file    Physically abused: Not on file    Forced sexual activity: Not on file  Other Topics Concern  . Not on file  Social History Narrative   Divorced. Education: [employer]. Exercise: No.    Family History  Problem Relation Age of Onset  . Diabetes Mother   . Heart disease Mother   . Hyperlipidemia Mother   . Hypertension Mother   . Stroke Mother   . Hypertension Father   . Parkinson's disease Father   . Heart disease Father   . Hyperlipidemia Father   . Hemachromatosis Daughter   . Thyroid disease Neg Hx      Review of Systems  Constitutional: Negative.  Negative for chills and fever.  HENT: Negative.  Negative for sore throat.   Eyes: Negative.   Respiratory: Negative.  Negative for cough and shortness of breath.   Cardiovascular: Negative.  Negative for chest pain and palpitations.  Gastrointestinal: Negative for abdominal pain, blood in stool, diarrhea, heartburn, melena, nausea and vomiting.  Genitourinary: Positive for flank pain.  Negative for dysuria, hematuria and urgency.  Musculoskeletal: Negative for myalgias and neck pain.  Skin: Negative.  Negative for rash.  Neurological: Negative for dizziness and headaches.  All other systems reviewed and are negative.   Vitals:   12/05/18 1601  BP: 133/65  Pulse: 91  Resp: 16  Temp: 98.7 F (37.1 C)  SpO2: 98%    Physical Exam Vitals signs reviewed.  Constitutional:      Appearance: Normal appearance.  HENT:     Head: Normocephalic and atraumatic.     Nose: Nose normal.     Mouth/Throat:     Mouth: Mucous membranes are moist.     Pharynx: Oropharynx is clear.  Eyes:     Extraocular Movements: Extraocular movements intact.     Conjunctiva/sclera: Conjunctivae normal.     Pupils: Pupils are equal, round, and reactive to light.  Neck:     Musculoskeletal: Normal range of motion and neck supple.  Cardiovascular:     Rate and Rhythm: Normal rate and regular rhythm.     Heart sounds: Normal heart sounds.  Pulmonary:     Effort: Pulmonary effort is normal.     Breath sounds: Normal breath sounds.  Chest:     Chest wall: No tenderness.  Abdominal:     General: There is no distension.     Palpations: Abdomen is soft.     Tenderness: There is no abdominal tenderness. There is no right CVA tenderness or left CVA tenderness.  Musculoskeletal: Normal range of motion.  Skin:    General: Skin is warm and dry.     Capillary Refill: Capillary refill takes less than 2 seconds.  Neurological:     General: No focal deficit present.     Mental Status: She is alert and oriented to person, place, and time.  Psychiatric:  Mood and Affect: Mood normal.        Behavior: Behavior normal.    Results for orders placed or performed in visit on 12/05/18 (from the past 24 hour(s))  POCT urinalysis dipstick     Status: Abnormal   Collection Time: 12/05/18  4:39 PM  Result Value Ref Range   Color, UA yellow yellow   Clarity, UA clear clear   Glucose, UA negative  negative mg/dL   Bilirubin, UA negative negative   Ketones, POC UA negative negative mg/dL   Spec Grav, UA 1.010 1.010 - 1.025   Blood, UA trace-intact (A) negative   pH, UA 6.0 5.0 - 8.0   Protein Ur, POC negative negative mg/dL   Urobilinogen, UA 0.2 0.2 or 1.0 E.U./dL   Nitrite, UA Negative Negative   Leukocytes, UA Negative Negative   A total of 25 minutes was spent in the room with the patient, greater than 50% of which was in counseling/coordination of care regarding differential diagnosis, need for diagnostic work-up, treatment, medications, prognosis, and need for follow-up.   ASSESSMENT & PLAN: Cloyce was seen today for flank pain.  Diagnoses and all orders for this visit:  Flank pain -     CBC with Differential/Platelet -     Comprehensive metabolic panel -     POCT urinalysis dipstick -     Urine Culture    Patient Instructions       If you have lab work done today you will be contacted with your lab results within the next 2 weeks.  If you have not heard from [place] then please contact us. The fastest way to get your results is to register for My Chart.   IF you received an x-ray today, you will receive an invoice from [facility]. Please contact [facility] at [phone] with questions or concerns regarding your invoice.   IF you received labwork today, you will receive an invoice from [facility]. Please contact [facility] at [phone] with questions or concerns regarding your invoice.   Our billing staff will not be able to assist you with questions regarding bills from these companies.  You will be contacted with the lab results as soon as they are available. The fastest way to get your results is to activate your My Chart account. Instructions are located on the last page of this paperwork. If you have not heard from [place] regarding the results in 2 weeks, please contact this office.     Flank Pain, Adult Flank pain is pain in your side. The  flank is the area of your side between your upper belly (abdomen) and your back. The pain may occur over a short time (acute), or it may be long-term or come back often (chronic). It may be mild or very bad. Pain in this area can be caused by many different things. Follow these instructions at home:   Drink enough fluid to keep your pee (urine) clear or pale yellow.  Rest as told by your doctor.  Take over-the-counter and prescription medicines only as told by your doctor.  Keep a journal to keep track of: ? What has caused your flank pain. ? What has made it feel better.  Keep all follow-up visits as told by your doctor. This is important. Contact a doctor if:  Medicine does not help your pain.  You have new symptoms.  Your pain gets worse.  You have a fever.  Your symptoms last longer than 2-3 days.  You have  trouble peeing.  You are peeing more often than normal. Get help right away if:  You have trouble breathing.  You are short of breath.  Your belly hurts, or it is swollen or red.  You feel sick to your stomach (nauseous).  You throw up (vomit).  You feel like you will pass out, or you do pass out (faint).  You have blood in your pee. Summary  Flank pain is pain in your side. The flank is the area of your side between your upper belly (abdomen) and your back.  Flank pain may occur over a short time (acute), or it may be long-term or come back often (chronic). It may be mild or very bad.  Pain in this area can be caused by many different things.  Contact your doctor if your symptoms get worse or they last longer than 2-3 days. This information is not intended to replace advice given to you by your health care provider. Make sure you discuss any questions you have with your health care provider. Document Released: 05/31/2008 Document Revised: 12/12/2016 Document Reviewed: 12/12/2016 Elsevier Interactive Patient Education  2019 Elsevier Inc.       Agustina Caroli, MD [facility]

## 2018-12-06 ENCOUNTER — Ambulatory Visit
Admission: RE | Admit: 2018-12-06 | Discharge: 2018-12-06 | Disposition: A | Discharge status: Home / Self Care | Payer: PPO | Source: Ambulatory Visit | Attending: Emergency Medicine | Admitting: Emergency Medicine

## 2018-12-06 DIAGNOSIS — Z992 Dependence on renal dialysis: Secondary | ICD-10-CM | POA: Diagnosis not present

## 2018-12-06 DIAGNOSIS — Z888 Allergy status to other drugs, medicaments and biological substances status: Secondary | ICD-10-CM | POA: Diagnosis not present

## 2018-12-06 DIAGNOSIS — R1084 Generalized abdominal pain: Secondary | ICD-10-CM | POA: Diagnosis not present

## 2018-12-06 DIAGNOSIS — Z20828 Contact with and (suspected) exposure to other viral communicable diseases: Secondary | ICD-10-CM | POA: Diagnosis not present

## 2018-12-06 DIAGNOSIS — D631 Anemia in chronic kidney disease: Secondary | ICD-10-CM | POA: Diagnosis not present

## 2018-12-06 DIAGNOSIS — E1122 Type 2 diabetes mellitus with diabetic chronic kidney disease: Secondary | ICD-10-CM | POA: Diagnosis not present

## 2018-12-06 DIAGNOSIS — Z79899 Other long term (current) drug therapy: Secondary | ICD-10-CM | POA: Diagnosis not present

## 2018-12-06 DIAGNOSIS — E875 Hyperkalemia: Secondary | ICD-10-CM | POA: Diagnosis not present

## 2018-12-06 DIAGNOSIS — Z89422 Acquired absence of other left toe(s): Secondary | ICD-10-CM | POA: Diagnosis not present

## 2018-12-06 DIAGNOSIS — F17210 Nicotine dependence, cigarettes, uncomplicated: Secondary | ICD-10-CM | POA: Diagnosis not present

## 2018-12-06 DIAGNOSIS — J9621 Acute and chronic respiratory failure with hypoxia: Secondary | ICD-10-CM | POA: Diagnosis not present

## 2018-12-06 DIAGNOSIS — Z794 Long term (current) use of insulin: Secondary | ICD-10-CM | POA: Diagnosis not present

## 2018-12-06 DIAGNOSIS — J189 Pneumonia, unspecified organism: Secondary | ICD-10-CM | POA: Diagnosis not present

## 2018-12-06 DIAGNOSIS — I120 Hypertensive chronic kidney disease with stage 5 chronic kidney disease or end stage renal disease: Secondary | ICD-10-CM | POA: Diagnosis not present

## 2018-12-06 DIAGNOSIS — Z8249 Family history of ischemic heart disease and other diseases of the circulatory system: Secondary | ICD-10-CM | POA: Diagnosis not present

## 2018-12-06 DIAGNOSIS — N186 End stage renal disease: Secondary | ICD-10-CM | POA: Diagnosis not present

## 2018-12-06 DIAGNOSIS — J811 Chronic pulmonary edema: Secondary | ICD-10-CM | POA: Diagnosis not present

## 2018-12-06 DIAGNOSIS — Z833 Family history of diabetes mellitus: Secondary | ICD-10-CM | POA: Diagnosis not present

## 2018-12-06 DIAGNOSIS — Z8619 Personal history of other infectious and parasitic diseases: Secondary | ICD-10-CM | POA: Diagnosis not present

## 2018-12-06 DIAGNOSIS — N2581 Secondary hyperparathyroidism of renal origin: Secondary | ICD-10-CM | POA: Diagnosis not present

## 2018-12-06 DIAGNOSIS — Z91048 Other nonmedicinal substance allergy status: Secondary | ICD-10-CM | POA: Diagnosis not present

## 2018-12-06 DIAGNOSIS — I252 Old myocardial infarction: Secondary | ICD-10-CM | POA: Diagnosis not present

## 2018-12-06 DIAGNOSIS — R109 Unspecified abdominal pain: Secondary | ICD-10-CM

## 2018-12-06 DIAGNOSIS — R011 Cardiac murmur, unspecified: Secondary | ICD-10-CM | POA: Diagnosis not present

## 2018-12-06 DIAGNOSIS — E8770 Fluid overload, unspecified: Secondary | ICD-10-CM | POA: Diagnosis not present

## 2018-12-06 LAB — CBC WITH DIFFERENTIAL/PLATELET
Basophils Absolute: 0 10*3/uL (ref 0.0–0.2)
Basos: 0 %
EOS (ABSOLUTE): 0 10*3/uL (ref 0.0–0.4)
Eos: 1 %
Hematocrit: 38 % (ref 34.0–46.6)
Hemoglobin: 13.1 g/dL (ref 11.1–15.9)
Immature Grans (Abs): 0 10*3/uL (ref 0.0–0.1)
Immature Granulocytes: 1 %
Lymphocytes Absolute: 1.4 10*3/uL (ref 0.7–3.1)
Lymphs: 25 %
MCH: 31.4 pg (ref 26.6–33.0)
MCHC: 34.5 g/dL (ref 31.5–35.7)
MCV: 91 fL (ref 79–97)
Monocytes Absolute: 0.5 10*3/uL (ref 0.1–0.9)
Monocytes: 10 %
Neutrophils Absolute: 3.5 10*3/uL (ref 1.4–7.0)
Neutrophils: 63 %
Platelets: 211 10*3/uL (ref 150–450)
RBC: 4.17 x10E6/uL (ref 3.77–5.28)
RDW: 11.8 % (ref 11.7–15.4)
WBC: 5.5 10*3/uL (ref 3.4–10.8)

## 2018-12-06 LAB — URINE CULTURE: Organism ID, Bacteria: NO GROWTH

## 2018-12-06 LAB — COMPREHENSIVE METABOLIC PANEL
ALT: 10 IU/L (ref 0–32)
AST: 14 IU/L (ref 0–40)
Albumin/Globulin Ratio: 3.7 — ABNORMAL HIGH (ref 1.2–2.2)
Albumin: 4.8 g/dL (ref 3.8–4.8)
Alkaline Phosphatase: 62 IU/L (ref 39–117)
BUN/Creatinine Ratio: 17 (ref 12–28)
BUN: 15 mg/dL (ref 8–27)
Bilirubin Total: 0.3 mg/dL (ref 0.0–1.2)
CO2: 23 mmol/L (ref 20–29)
Calcium: 9.5 mg/dL (ref 8.7–10.3)
Chloride: 102 mmol/L (ref 96–106)
Creatinine, Ser: 0.87 mg/dL (ref 0.57–1.00)
GFR calc Af Amer: 80 mL/min/{1.73_m2} (ref >59)
GFR calc non Af Amer: 69 mL/min/{1.73_m2} (ref >59)
Globulin, Total: 1.3 g/dL — ABNORMAL LOW (ref 1.5–4.5)
Glucose: 127 mg/dL — ABNORMAL HIGH (ref 65–99)
Potassium: 4.6 mmol/L (ref 3.5–5.2)
Sodium: 140 mmol/L (ref 134–144)
Total Protein: 6.1 g/dL (ref 6.0–8.5)

## 2018-12-06 MED ORDER — IOPAMIDOL (ISOVUE-300) INJECTION 61%
100.0000 mL | Freq: Once | INTRAVENOUS | Status: AC | PRN
  Administered 2018-12-06: 100 mL via INTRAVENOUS

## 2018-12-08 ENCOUNTER — Encounter: Payer: Self-pay | Admitting: Emergency Medicine

## 2018-12-08 ENCOUNTER — Other Ambulatory Visit: Payer: Self-pay | Admitting: Emergency Medicine

## 2018-12-08 DIAGNOSIS — N2889 Other specified disorders of kidney and ureter: Secondary | ICD-10-CM

## 2018-12-17 ENCOUNTER — Telehealth: Payer: Self-pay | Admitting: Emergency Medicine

## 2018-12-17 ENCOUNTER — Telehealth: Payer: Self-pay

## 2018-12-17 NOTE — Telephone Encounter (Signed)
Pt is asking for result of her mri

## 2018-12-17 NOTE — Telephone Encounter (Signed)
Copied from [place] [id number]. Topic: Referral - Status >> Dec 17, 2018 12:53 PM Reyne Dumas L wrote: Reason for CRM:   Pt called to find out status of MRI.

## 2018-12-18 NOTE — Telephone Encounter (Signed)
Please look into MRI referral.

## 2018-12-18 NOTE — Telephone Encounter (Signed)
Please look into this MRI request.  Thanks.

## 2018-12-18 NOTE — Telephone Encounter (Signed)
12/18/2018 - PATIENT CALLED TO FIND OUT WHY SHE HAD NOT HEARD ABOUT HER MRI REFERRAL. SHE SAW DR. Kittie Plater ON 12/05/2018 FOR (R) FLANK PAIN. WE GAVE HER THE PHONE NUMBER TO [facility] TO CALL AND FIND OUT WHEN SHE WILL BE SCHEDULED. SHERRON SAID HER INFORMATION WAS SENT TO THEM ON 12/08/2018. SHE WAS SENT TO HAVE A CT SCAN AND SAID SHE DIDN'T UNDERSTAND WHY DR. SAGARDIA WOULD GIVE HER THE RESULTS OF HER TEST THRU MY-CHART. SHE SAID HER DAUGHTER IS A RN AND WAS VERY SURPRISED THAT SHE WAS GIVEN HER RESULTS THIS WAY.  BEST PHONE [phone] (CELL) [place]

## 2018-12-19 NOTE — Telephone Encounter (Signed)
fyi

## 2018-12-25 ENCOUNTER — Other Ambulatory Visit: Payer: Self-pay

## 2018-12-25 ENCOUNTER — Ambulatory Visit
Admission: RE | Admit: 2018-12-25 | Discharge: 2018-12-25 | Disposition: A | Discharge status: Home / Self Care | Payer: PPO | Source: Ambulatory Visit | Attending: Emergency Medicine | Admitting: Emergency Medicine

## 2018-12-25 DIAGNOSIS — N2889 Other specified disorders of kidney and ureter: Secondary | ICD-10-CM | POA: Diagnosis not present

## 2018-12-25 MED ORDER — GADOBENATE DIMEGLUMINE 529 MG/ML IV SOLN
16.0000 mL | Freq: Once | INTRAVENOUS | Status: AC | PRN
  Administered 2018-12-25: 16 mL via INTRAVENOUS

## 2018-12-26 ENCOUNTER — Other Ambulatory Visit: Payer: Self-pay | Admitting: Emergency Medicine

## 2018-12-26 ENCOUNTER — Encounter: Payer: Self-pay | Admitting: Emergency Medicine

## 2018-12-26 DIAGNOSIS — N2889 Other specified disorders of kidney and ureter: Secondary | ICD-10-CM

## 2018-12-26 DIAGNOSIS — N281 Cyst of kidney, acquired: Secondary | ICD-10-CM

## 2018-12-27 ENCOUNTER — Other Ambulatory Visit: Payer: Self-pay | Admitting: Family Medicine

## 2018-12-27 DIAGNOSIS — D3001 Benign neoplasm of right kidney: Secondary | ICD-10-CM | POA: Diagnosis not present

## 2018-12-27 DIAGNOSIS — D3002 Benign neoplasm of left kidney: Secondary | ICD-10-CM | POA: Diagnosis not present

## 2018-12-30 DIAGNOSIS — H00011 Hordeolum externum right upper eyelid: Secondary | ICD-10-CM | POA: Diagnosis not present

## 2019-01-03 ENCOUNTER — Encounter: Payer: Self-pay | Admitting: Family Medicine

## 2019-01-03 ENCOUNTER — Telehealth: Payer: PPO | Admitting: Family Medicine

## 2019-01-03 ENCOUNTER — Other Ambulatory Visit: Payer: Self-pay

## 2019-01-03 ENCOUNTER — Telehealth (INDEPENDENT_AMBULATORY_CARE_PROVIDER_SITE_OTHER): Payer: PPO | Admitting: Family Medicine

## 2019-01-03 VITALS — BP 147/71 | HR 89 | Temp 98.3°F | Ht 64.0 in | Wt 176.4 lb

## 2019-01-03 DIAGNOSIS — H00011 Hordeolum externum right upper eyelid: Secondary | ICD-10-CM

## 2019-01-03 MED ORDER — DOXYCYCLINE HYCLATE 100 MG PO TABS: 100.0000 mg | ORAL_TABLET | Freq: Two times a day (BID) | ORAL | 0 refills | Status: DC

## 2019-01-03 NOTE — Progress Notes (Signed)
Acute Office Visit  Subjective:    Patient ID: Becky Lawson, female    DOB: Jul 04, 1951, 68 y.o.   MRN: [id number] Cc: 5-6 days ago had a stye on top of right eye and now there are multiple on the same eye.   HPI Patient is in today for style with worsening symptoms. Pt seen at [facility] 2 days ago and started on topical erythromycin ointment. Pt states eye more red and swollen on upper lid today. Pt with heaviness noted on the right upper lid. No blurred or double vision. No fever. No pain. Drainage noted on the eye in the mornings.  Pt taking pain pills due to eye irritation(had previously). Pt takes vistaril for itching  Past Medical History:  Diagnosis Date  . Anxiety   . Arthritis   . Benign cyst of right kidney 2014   2.7 cm right kidney cyst seen on imaging 2014 but was c/o right flank pain with new persistent proteinuria (fortunately hematuria had resolved) so repeat US 11/2016 showed right kidney still with 2.6 cm simple cyst and otherwise nml.  . Bipolar 1 disorder ([place])   . Chronic kidney disease   . Depression   . GERD (gastroesophageal reflux disease)   . Heart murmur   . Hepatitis C    resolved completely after treatment in 2014, genotype 1b, followed at [facility]  . Hyperlipidemia   . Hypertension   . Jaundice 11/01/2012  . Pruritic disorder 02/13/2013  . Sleep apnea    Was diagnosed approximately 20 years ago, but does not wear CPAP patient stated "I gave it back.Marland KitchenMarland KitchenI couldn't wear that thing it was horrible"  . Spinal stenosis     Past Surgical History:  Procedure Laterality Date  . ABDOMINAL HYSTERECTOMY    . COLONOSCOPY W/ POLYPECTOMY    . JOINT REPLACEMENT Bilateral    knee  . KNEE ARTHROSCOPY Bilateral   . PARTIAL HYSTERECTOMY    . THYROID LOBECTOMY Left 06/16/2016  . THYROID LOBECTOMY Left 06/16/2016   Procedure: LEFT THYROID LOBECTOMY;  Surgeon: Greer Pickerel, MD;  Location: [facility];  Service: General;  Laterality: Left;    Family History  Problem Relation Age of  Onset  . Diabetes Mother   . Heart disease Mother   . Hyperlipidemia Mother   . Hypertension Mother   . Stroke Mother   . Hypertension Father   . Parkinson's disease Father   . Heart disease Father   . Hyperlipidemia Father   . Hemachromatosis Daughter   . Thyroid disease Neg Hx     Social History   Socioeconomic History  . Marital status: Divorced    Spouse name: Not on file  . Number of children: Not on file  . Years of education: Not on file  . Highest education level: Not on file  Occupational History  . Occupation: Geophysical data processor  Social Needs  . Financial resource strain: Not on file  . Food insecurity:    Worry: Not on file    Inability: Not on file  . Transportation needs:    Medical: Not on file    Non-medical: Not on file  Tobacco Use  . Smoking status: Former Smoker    Packs/day: 1.00    Years: 15.00    Pack years: 15.00    Last attempt to quit: 06/30/1979    Years since quitting: 39.5  . Smokeless tobacco: Never Used  Substance and Sexual Activity  . Alcohol use: Yes    Alcohol/week: 1.0 standard drinks  Types: 1 Standard drinks or equivalent per week    Comment: every few months  . Drug use: No  . Sexual activity: Yes    Birth control/protection: None  Lifestyle  . Physical activity:    Days per week: Not on file    Minutes per session: Not on file  . Stress: Not on file  Relationships  . Social connections:    Talks on phone: Not on file    Gets together: Not on file    Attends religious service: Not on file    Active member of club or organization: Not on file    Attends meetings of clubs or organizations: Not on file    Relationship status: Not on file  . Intimate partner violence:    Fear of current or ex partner: Not on file    Emotionally abused: Not on file    Physically abused: Not on file    Forced sexual activity: Not on file  Other Topics Concern  . Not on file  Social History Narrative   Divorced. Education: [employer].  Exercise: No.    Outpatient Medications Prior to Visit  Medication Sig Dispense Refill  . ALPRAZolam (XANAX) 1 MG tablet 1 hs and I prn 110 tablet 0  . amLODipine (NORVASC) 10 MG tablet Take 1 tablet (10 mg total) by mouth daily. 90 tablet 3  . buPROPion (WELLBUTRIN XL) 300 MG 24 hr tablet Take 1 tablet (300 mg total) by mouth daily. 90 tablet 0  . candesartan (ATACAND) 32 MG tablet Take 1 tablet (32 mg total) by mouth daily. 90 tablet 1  . carbidopa-levodopa (SINEMET IR) 25-100 MG tablet TAKE 1 TABLET BY MOUTH EVERYDAY AT BEDTIME 90 tablet 1  . Cholecalciferol (VITAMIN D-3) 5000 units TABS Take 5,000 Units daily by mouth. In addition to the rx high dose once weekly vitamin D of 50Ku. 30 tablet   . Coenzyme Q10 (CO Q-10) 300 MG CAPS Take 1 capsule daily by mouth.    . Ginkgo Biloba 40 MG TABS Take 1 tablet daily by mouth.    [name] HYDROcodone-acetaminophen (NORCO) 10-325 MG tablet Take 1 tablet by mouth 3 (three) times daily.     . hydrOXYzine (ATARAX/VISTARIL) 25 MG tablet Take 2 tablets (50 mg total) by mouth every 6 (six) hours as needed for anxiety or itching. 240 tablet 0  . lamoTRIgine (LAMICTAL) 200 MG tablet TAKE 1 TAB BY MOUTH TWICE A DAY 180 tablet 0  . levothyroxine (SYNTHROID, LEVOTHROID) 100 MCG tablet Take 1 tablet (100 mcg total) by mouth daily before breakfast. 90 tablet 1  . magnesium oxide (MAG-OX) 400 MG tablet Take 400 mg by mouth daily.    . Omega-3 Fatty Acids (FISH OIL) 1200 MG CAPS Take by mouth.    [name] omeprazole (PRILOSEC) 40 MG capsule TAKE 1 CAPSULE BY MOUTH EVERY DAY AS NEEDED FOR INDIGESTION OR HEARTBURN 90 capsule 0  . pravastatin (PRAVACHOL) 40 MG tablet Take 1 tablet (40 mg total) by mouth daily. 90 tablet 3  . traZODone (DESYREL) 100 MG tablet Take 3 tablets (300 mg total) by mouth daily. 270 tablet 0  . Vitamin D, Ergocalciferol, (DRISDOL) 1.25 MG (50000 UT) CAPS capsule TAKE 1 CAPSULE (50,000 UNITS TOTAL) BY MOUTH 2 (TWO) TIMES A WEEK. 26 capsule 1  . vitamin E  600 UNIT capsule Take 600 Units by mouth daily.    [name] docusate sodium (COLACE) 100 MG capsule Take 1 capsule (100 mg total) by mouth 2 (two) times daily. (  Patient not taking: Reported on 01/03/2019) 10 capsule 0  . triamcinolone cream (KENALOG) 0.1 % Apply 1 application topically 4 (four) times daily. (Patient not taking: Reported on 01/03/2019) 45 g 0   Facility-Administered Medications Prior to Visit  Medication Dose Route Frequency Provider Last Rate Last Dose  . cyanocobalamin ((VITAMIN B-12)) injection 1,000 mcg  1,000 mcg Intramuscular Q30 days Shawnee Knapp, MD   1,000 mcg at 07/12/18 1652    Allergies  Allergen Reactions  . No Known Allergies     ROS CONSTITUTIONAL: no  fever EENT: NO sinus problems, NO nasal congestion, right upper lid swelling and eryth, tenderness to touch, eryth medially INTEG: persistent  itching    Objective:    Physical Exam  Constitutional: She appears well-developed and well-nourished.  Eyes: Right eye exhibits discharge.   Upper lid eryth, swelling, stye Medial-eryth Lower lid-no swelling BP (!) 147/71 (BP Location: Right Arm, Patient Position: Sitting, Cuff Size: Normal)   Pulse 89   Temp 98.3 F (36.8 C) (Oral)   Ht 5\' 4"  (1.626 m)   Wt 176 lb 6.4 oz (80 kg)   SpO2 96%   BMI 30.28 kg/m  Wt Readings from Last 3 Encounters:  01/03/19 176 lb 6.4 oz (80 kg)  12/05/18 175 lb (79.4 kg)  07/19/18 188 lb (85.3 kg)    Lab Results  Component Value Date   TSH 0.824 07/12/2018   Lab Results  Component Value Date   WBC 5.5 12/05/2018   HGB 13.1 12/05/2018   HCT 38.0 12/05/2018   MCV 91 12/05/2018   PLT 211 12/05/2018   Lab Results  Component Value Date   NA 140 12/05/2018   K 4.6 12/05/2018   CO2 23 12/05/2018   GLUCOSE 127 (H) 12/05/2018   BUN 15 12/05/2018   CREATININE 0.87 12/05/2018   BILITOT 0.3 12/05/2018   ALKPHOS 62 12/05/2018   AST 14 12/05/2018   ALT 10 12/05/2018   PROT 6.1 12/05/2018   ALBUMIN 4.8 12/05/2018   CALCIUM  9.5 12/05/2018   ANIONGAP 8 01/29/2017   Lab Results  Component Value Date   CHOL 178 07/12/2018   Lab Results  Component Value Date   HDL 51 07/12/2018   Lab Results  Component Value Date   LDLCALC 108 (H) 07/12/2018   Lab Results  Component Value Date   TRIG 96 07/12/2018   Lab Results  Component Value Date   CHOLHDL 3.5 07/12/2018   Lab Results  Component Value Date   HGBA1C 5.0 07/12/2018       Assessment & Plan:  Hordeolum externum of right upper eyelid vistaril-use for itching at night Doxy-rx-severe with worsening symptoms using topical ointment-concern for drainage and continued swelling obstructing pts sight   Problem List Items Addressed This Visit    None          Hannah Beat, MD

## 2019-01-03 NOTE — Progress Notes (Signed)
5-6 days ago had a stye on top of right eye and now there all on the same eye. The stye is all over that eye now.   Pt stated that it would have been better if she can come on in so I have informed her to come on in. She says she will be here in 20 min max.

## 2019-01-06 ENCOUNTER — Other Ambulatory Visit: Payer: Self-pay

## 2019-01-06 MED ORDER — BUPROPION HCL ER (XL) 300 MG PO TB24: 300.0000 mg | ORAL_TABLET | Freq: Every day | ORAL | 0 refills | Status: DC

## 2019-01-09 ENCOUNTER — Ambulatory Visit (INDEPENDENT_AMBULATORY_CARE_PROVIDER_SITE_OTHER): Payer: PPO | Admitting: Emergency Medicine

## 2019-01-09 ENCOUNTER — Telehealth: Payer: Self-pay | Admitting: Family Medicine

## 2019-01-09 ENCOUNTER — Other Ambulatory Visit: Payer: Self-pay

## 2019-01-09 VITALS — BP 147/71 | Ht 64.0 in | Wt 176.0 lb

## 2019-01-09 DIAGNOSIS — Z0000 Encounter for general adult medical examination without abnormal findings: Secondary | ICD-10-CM

## 2019-01-09 MED ORDER — ALPRAZOLAM 1 MG PO TABS: ORAL_TABLET | ORAL | 0 refills | Status: DC

## 2019-01-09 NOTE — Progress Notes (Addendum)
Presents today for [employer] Visit  I connected with  Mccartney Brucks on 01/21/19 by a video enabled telemedicine application and verified that I am speaking with the correct person using two identifiers.  The patient expressed understanding and agreed to proceed.     Date of last exam: 01/03/2019  Interpreter used for this visit?   No  Tele med   Patient Care Team: Shawnee Knapp, MD as PCP - General (Family Medicine) Luz Brazen as Physician Assistant (Psychiatry) Marty Heck, MD as Consulting Physician (Gastroenterology) Adrian Prows, MD as Consulting Physician (Cardiology) Renato Shin, MD as Consulting Physician (Endocrinology)  Pickerel, MD as Consulting Physician (General Surgery) Suella Broad, MD as Consulting Physician (Physical Medicine and Rehabilitation)   Other items to address today:   Discussed eye/dental yearly exams Discussed immunizations Discussed Exercise    Other Screening: Last screening for diabetes: 07/12/2018 Last lipid screening: 07/12/2018  ADVANCE DIRECTIVES: Discussed: yes On File: no  (copy requested) Materials Provided: no  Immunization status:  Immunization History  Administered Date(s) Administered  . Influenza, High Dose Seasonal PF 04/27/2017, 05/15/2018  . Influenza,inj,Quad PF,6+ Mos 05/19/2015, 06/17/2016  . Pneumococcal Conjugate-13 07/12/2018  . Pneumococcal Polysaccharide-23 06/17/2016  . Tdap 01/29/2017     There are no preventive care reminders to display for this patient.   Functional Status Survey: Is the patient deaf or have difficulty hearing?: No Does the patient have difficulty seeing, even when wearing glasses/contacts?: No Does the patient have difficulty concentrating, remembering, or making decisions?: No Does the patient have difficulty dressing or bathing?: No Does the patient have difficulty doing errands alone such as visiting a doctor's office or shopping?: No   6CIT  Screen 01/09/2019  What Year? 0 points  What month? 0 points  What time? 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 Tremor 07/12/2018  . Prediabetes 07/12/2018  . Polypharmacy 07/12/2018  . Unintentional weight loss of more than 10 pounds 07/12/2018  . OSA (obstructive sleep apnea) 06/02/2018  . Fatty liver 04/18/2017  . GERD (gastroesophageal reflux disease) 10/28/2016  . 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 Cough 06/24/2016  . S/P partial thyroidectomy 06/16/2016  . Multinodular goiter 06/02/2016  . Thyroid nodule 02/08/2016  . History of hepatitis C 05/19/2015  . Vitamin D deficiency 05/19/2015  . Spinal stenosis of lumbar region 05/19/2015  . Hyperlipidemia 05/19/2015  . Bipolar disorder ([place]) 05/19/2015  . Essential hypertension 09/26/2013  . Undiagnosed cardiac murmurs 09/26/2013     Past Medical History:  Diagnosis Date  . Anxiety   . Arthritis   . Benign cyst of right kidney 2014   2.7 cm right kidney cyst seen on imaging 2014 but was c/o right flank pain with new persistent proteinuria (fortunately hematuria had resolved) so repeat US 11/2016 showed right kidney still with 2.6 cm simple cyst and otherwise nml.  . Bipolar 1 disorder ([place])   . Chronic kidney disease   . Depression   . GERD (  gastroesophageal reflux disease)   . Heart murmur   . Hepatitis C    resolved completely after treatment in 2014, genotype 1b, followed at [facility]  . Hyperlipidemia   . Hypertension   . Jaundice 11/01/2012  . Pruritic disorder 02/13/2013  . Sleep apnea    Was diagnosed approximately 20  years ago, but does not wear CPAP patient stated "I gave it back.Marland KitchenMarland KitchenI couldn't wear that thing it was horrible"  . Spinal stenosis      Past Surgical History:  Procedure Laterality Date  . ABDOMINAL HYSTERECTOMY    . COLONOSCOPY W/ POLYPECTOMY    . JOINT REPLACEMENT Bilateral    knee  . KNEE ARTHROSCOPY Bilateral   . PARTIAL HYSTERECTOMY    . THYROID LOBECTOMY Left 06/16/2016  . THYROID LOBECTOMY Left 06/16/2016   Procedure: LEFT THYROID LOBECTOMY;  Surgeon:  Pickerel, MD;  Location: [facility];  Service: General;  Laterality: Left;     Family History  Problem Relation Age of Onset  . Diabetes Mother   . Heart disease Mother   . Hyperlipidemia Mother   . Hypertension Mother   . Stroke Mother   . Hypertension Father   . Parkinson's disease Father   . Heart disease Father   . Hyperlipidemia Father   . Hemachromatosis Daughter   . Thyroid disease Neg Hx      Social History   Socioeconomic History  . Marital status: Divorced    Spouse name: Not on file  . Number of children: Not on file  . Years of education: Not on file  . Highest education level: Not on file  Occupational History  . Occupation: Geophysical data processor  Social Needs  . Financial resource strain: Not on file  . Food insecurity:    Worry: Not on file    Inability: Not on file  . Transportation needs:    Medical: Not on file    Non-medical: Not on file  Tobacco Use  . Smoking status: Former Smoker    Packs/day: 1.00    Years: 15.00    Pack years: 15.00    Last attempt to quit: 06/30/1979    Years since quitting: 39.5  . Smokeless tobacco: Never Used  Substance and Sexual Activity  . Alcohol use: Yes    Alcohol/week: 1.0 standard drinks    Types: 1 Standard drinks or equivalent per week    Comment: every few months  . Drug use: No  . Sexual activity: Yes    Birth control/protection: None  Lifestyle  . Physical activity:    Days per week: Not on file    Minutes per session: Not on file  .  Stress: Not on file  Relationships  . Social connections:    Talks on phone: Not on file    Gets together: Not on file    Attends religious service: Not on file    Active member of club or organization: Not on file    Attends meetings of clubs or organizations: Not on file    Relationship status: Not on file  . Intimate partner violence:    Fear of current or ex partner: Not on file    Emotionally abused: Not on file    Physically abused: Not on file    Forced sexual activity: Not on file  Other Topics Concern  . Not on file  Social History Narrative   Divorced. Education: [employer]. Exercise: No.     Allergies  Allergen Reactions  . No Known Allergies  Prior to Admission medications   Medication Sig Start Date End Date Taking? Authorizing Provider  ALPRAZolam Duanne Moron) 1 MG tablet 1 hs and I prn 01/09/19  Yes Hurst, Teresa T, PA-C  amLODipine (NORVASC) 10 MG tablet Take 1 tablet (10 mg total) by mouth daily. 07/12/18  Yes Shawnee Knapp, MD  buPROPion (WELLBUTRIN XL) 300 MG 24 hr tablet Take 1 tablet (300 mg total) by mouth daily. 01/06/19  Yes Cottle, Billey Co., MD  candesartan (ATACAND) 32 MG tablet Take 1 tablet (32 mg total) by mouth daily. 07/12/18  Yes Shawnee Knapp, MD  carbidopa-levodopa (SINEMET IR) 25-100 MG tablet TAKE 1 TABLET BY MOUTH EVERYDAY AT BEDTIME 08/21/18  Yes Shawnee Knapp, MD  Cholecalciferol (VITAMIN D-3) 5000 units TABS Take 5,000 Units daily by mouth. In addition to the rx high dose once weekly vitamin D of 50Ku. 07/24/17  Yes Shawnee Knapp, MD  Coenzyme Q10 (CO Q-10) 300 MG CAPS Take 1 capsule daily by mouth.   Yes [provider]  doxycycline (VIBRA-TABS) 100 MG tablet Take 1 tablet (100 mg total) by mouth 2 (two) times daily. 01/03/19  Yes Corum, Rex Kras, MD  Ginkgo Biloba 40 MG TABS Take 1 tablet daily by mouth.   Yes [provider]  HYDROcodone-acetaminophen (NORCO) 10-325 MG tablet Take 1 tablet by mouth 3 (three) times daily.    Yes  [provider]  hydrOXYzine (ATARAX/VISTARIL) 25 MG tablet Take 2 tablets (50 mg total) by mouth every 6 (six) hours as needed for anxiety or itching. 10/03/18  Yes Shugart, [name], PA-C  lamoTRIgine (LAMICTAL) 200 MG tablet TAKE 1 TAB BY MOUTH TWICE A DAY 10/03/18  Yes Shugart, [name], PA-C  levothyroxine (SYNTHROID, LEVOTHROID) 100 MCG tablet Take 1 tablet (100 mcg total) by mouth daily before breakfast. 07/12/18  Yes Shawnee Knapp, MD  magnesium oxide (MAG-OX) 400 MG tablet Take 400 mg by mouth daily.   Yes [provider]  Omega-3 Fatty Acids (FISH OIL) 1200 MG CAPS Take by mouth.   Yes [provider]  omeprazole (PRILOSEC) 40 MG capsule TAKE 1 CAPSULE BY MOUTH EVERY DAY AS NEEDED FOR INDIGESTION OR HEARTBURN 12/27/18  Yes Shawnee Knapp, MD  pravastatin (PRAVACHOL) 40 MG tablet Take 1 tablet (40 mg total) by mouth daily. 07/12/18  Yes Shawnee Knapp, MD  traZODone (DESYREL) 100 MG tablet Take 3 tablets (300 mg total) by mouth daily. 10/03/18  Yes Shugart, [name], PA-C  vitamin E 600 UNIT capsule Take 600 Units by mouth daily.   Yes [provider]  docusate sodium (COLACE) 100 MG capsule Take 1 capsule (100 mg total) by mouth 2 (two) times daily. Patient not taking: Reported on 01/03/2019 05/13/17   Shawnee Knapp, MD  triamcinolone cream (KENALOG) 0.1 % Apply 1 application topically 4 (four) times daily. Patient not taking: Reported on 01/03/2019 07/12/18   Shawnee Knapp, MD  Vitamin D, Ergocalciferol, (DRISDOL) 1.25 MG (50000 UT) CAPS capsule TAKE 1 CAPSULE (50,000 UNITS TOTAL) BY MOUTH 2 (TWO) TIMES A WEEK. Patient not taking: Reported on 01/09/2019 10/08/18   Shawnee Knapp, MD     Depression screen [facility] 2/9 01/03/2019 12/05/2018 11/30/2018 07/12/2018 03/24/2018  Decreased Interest 0 2 0 1 3  Down, Depressed, Hopeless 0 2 0 1 3  PHQ - 2 Score 0 4 0 2 6  Altered sleeping - 0 - 0 3  Tired, decreased energy - 2 - 3 3  Change in appetite - - -  0 0  Feeling bad or failure about yourself  - 0  - 0 2  Trouble concentrating - 0 - 0 0  Moving slowly or fidgety/restless - 0 - 0 0  Suicidal thoughts - 0 - 0 0  PHQ-9 Score - 6 - 5 14  Difficult doing work/chores - - - - -  Some recent data might be hidden     Fall Risk  01/09/2019 01/03/2019 12/05/2018 11/30/2018 07/12/2018  Falls in the past year? 0 0 0 0 0  Number falls in past yr: 0 0 - 0 0  Injury with Fall? 0 0 - 0 0  Comment - - - - -  Risk Factor Category  - - - - -  Follow up - - Falls evaluation completed - -      PHYSICAL EXAM: BP (!) 147/71   Ht 5\' 4"  (1.626 m)   Wt 176 lb (79.8 kg)   BMI 30.21 kg/m    Wt Readings from Last 3 Encounters:  01/09/19 176 lb (79.8 kg)  01/03/19 176 lb 6.4 oz (80 kg)  12/05/18 175 lb (79.4 kg)     No exam data present    Physical Exam   Education/Counseling provided regarding diet and exercise, prevention of chronic diseases, smoking/tobacco cessation, if applicable, and reviewed "Covered Medicare Preventive Services."   ASSESSMENT/PLAN: 1. Medicare annual wellness visit, subsequent    I have reviewed and agree with the above AWV documentation. Dr.Sagardia

## 2019-01-09 NOTE — Telephone Encounter (Signed)
Copied from [place] [id number]. Topic: Quick Communication - See Telephone Encounter >> Jan 09, 2019  1:11 PM Vernona Rieger wrote: CRM for notification. See Telephone encounter for: 01/09/19.  Patient had a tele-visit with Benny Lennert on 4/30 for a eye infection. She has finished the medication but now the white part of her eye is bloody. She would like the nurse to call her [phone]

## 2019-01-09 NOTE — Patient Instructions (Addendum)
Thank you for taking time to come for your Medicare Wellness Visit. I appreciate your ongoing commitment to your health goals. Please review the following plan we discussed and let me know if I can assist you in the future.  Julie Greer LPN  Healthy Eating Following a healthy eating pattern may help you to achieve and maintain a healthy body weight, reduce the risk of chronic disease, and live a long and productive life. It is important to follow a healthy eating pattern at an appropriate calorie level for your body. Your nutritional needs should be met primarily through food by choosing a variety of nutrient-rich foods. What are tips for following this plan? Reading food labels  Read labels and choose the following: ? Reduced or low sodium. ? Juices with 100% fruit juice. ? Foods with low saturated fats and high polyunsaturated and monounsaturated fats. ? Foods with whole grains, such as whole wheat, cracked wheat, brown rice, and wild rice. ? Whole grains that are fortified with folic acid. This is recommended for women who are pregnant or who want to become pregnant.  Read labels and avoid the following: ? Foods with a lot of added sugars. These include foods that contain brown sugar, corn sweetener, corn syrup, dextrose, fructose, glucose, high-fructose corn syrup, honey, invert sugar, lactose, malt syrup, maltose, molasses, raw sugar, sucrose, trehalose, or turbinado sugar.  Do not eat more than the following amounts of added sugar per day:  6 teaspoons (25 g) for women.  9 teaspoons (38 g) for men. ? Foods that contain processed or refined starches and grains. ? Refined grain products, such as white flour, degermed cornmeal, white bread, and white rice. Shopping  Choose nutrient-rich snacks, such as vegetables, whole fruits, and nuts. Avoid high-calorie and high-sugar snacks, such as potato chips, fruit snacks, and candy.  Use oil-based dressings and spreads on foods instead of  solid fats such as butter, stick margarine, or cream cheese.  Limit pre-made sauces, mixes, and "instant" products such as flavored rice, instant noodles, and ready-made pasta.  Try more plant-protein sources, such as tofu, tempeh, black beans, edamame, lentils, nuts, and seeds.  Explore eating plans such as the Mediterranean diet or vegetarian diet. Cooking  Use oil to saut or stir-fry foods instead of solid fats such as butter, stick margarine, or lard.  Try baking, boiling, grilling, or broiling instead of frying.  Remove the fatty part of meats before cooking.  Steam vegetables in water or broth. Meal planning   At meals, imagine dividing your plate into fourths: ? One-half of your plate is fruits and vegetables. ? One-fourth of your plate is whole grains. ? One-fourth of your plate is protein, especially lean meats, poultry, eggs, tofu, beans, or nuts.  Include low-fat dairy as part of your daily diet. Lifestyle  Choose healthy options in all settings, including home, work, school, restaurants, or stores.  Prepare your food safely: ? Wash your hands after handling raw meats. ? Keep food preparation surfaces clean by regularly washing with hot, soapy water. ? Keep raw meats separate from ready-to-eat foods, such as fruits and vegetables. ? Cook seafood, meat, poultry, and eggs to the recommended internal temperature. ? Store foods at safe temperatures. In general:  Keep cold foods at 40F (4.4C) or below.  Keep hot foods at 140F (60C) or above.  Keep your freezer at 0F (-17.8C) or below.  Foods are no longer safe to eat when they have been between the temperatures of 40-140F (4.4-60C) for   more than 2 hours. What foods should I eat? Fruits Aim to eat 2 cup-equivalents of fresh, canned (in natural juice), or frozen fruits each day. Examples of 1 cup-equivalent of fruit include 1 small apple, 8 large strawberries, 1 cup canned fruit,  cup dried fruit, or 1 cup  100% juice. Vegetables Aim to eat 2-3 cup-equivalents of fresh and frozen vegetables each day, including different varieties and colors. Examples of 1 cup-equivalent of vegetables include 2 medium carrots, 2 cups raw, leafy greens, 1 cup chopped vegetable (raw or cooked), or 1 medium baked potato. Grains Aim to eat 6 ounce-equivalents of whole grains each day. Examples of 1 ounce-equivalent of grains include 1 slice of bread, 1 cup ready-to-eat cereal, 3 cups popcorn, or  cup cooked rice, pasta, or cereal. Meats and other proteins Aim to eat 5-6 ounce-equivalents of protein each day. Examples of 1 ounce-equivalent of protein include 1 egg, 1/2 cup nuts or seeds, or 1 tablespoon (16 g) peanut butter. A cut of meat or fish that is the size of a deck of cards is about 3-4 ounce-equivalents.  Of the protein you eat each week, try to have at least 8 ounces come from seafood. This includes salmon, trout, herring, and anchovies. Dairy Aim to eat 3 cup-equivalents of fat-free or low-fat dairy each day. Examples of 1 cup-equivalent of dairy include 1 cup (240 mL) milk, 8 ounces (250 g) yogurt, 1 ounces (44 g) natural cheese, or 1 cup (240 mL) fortified soy milk. Fats and oils  Aim for about 5 teaspoons (21 g) per day. Choose monounsaturated fats, such as canola and olive oils, avocados, peanut butter, and most nuts, or polyunsaturated fats, such as sunflower, corn, and soybean oils, walnuts, pine nuts, sesame seeds, sunflower seeds, and flaxseed. Beverages  Aim for six 8-oz glasses of water per day. Limit coffee to three to five 8-oz cups per day.  Limit caffeinated beverages that have added calories, such as soda and energy drinks.  Limit alcohol intake to no more than 1 drink a day for nonpregnant women and 2 drinks a day for men. One drink equals 12 oz of beer (355 mL), 5 oz of wine (148 mL), or 1 oz of hard liquor (44 mL). Seasoning and other foods  Avoid adding excess amounts of salt to your  foods. Try flavoring foods with herbs and spices instead of salt.  Avoid adding sugar to foods.  Try using oil-based dressings, sauces, and spreads instead of solid fats. This information is based on general [place] nutrition guidelines. For more information, visit choosemyplate.gov. Exact amounts may vary based on your nutrition needs. Summary  A healthy eating plan may help you to maintain a healthy weight, reduce the risk of chronic diseases, and stay active throughout your life.  Plan your meals. Make sure you eat the right portions of a variety of nutrient-rich foods.  Try baking, boiling, grilling, or broiling instead of frying.  Choose healthy options in all settings, including home, work, school, restaurants, or stores. This information is not intended to replace advice given to you by your health care provider. Make sure you discuss any questions you have with your health care provider. Document Released: 12/04/2017 Document Revised: 12/04/2017 Document Reviewed: 12/04/2017 Elsevier Interactive Patient Education  2019 Elsevier Inc.  

## 2019-01-10 NOTE — Telephone Encounter (Signed)
Can you suggest something else or would you like another tele med.

## 2019-01-10 NOTE — Telephone Encounter (Signed)
PT NEEDS TO BE SCHEDULED FOR OFFICE VISIT NOT A TELE-VISIT

## 2019-01-20 IMAGING — US US SOFT TISSUE HEAD/NECK
1 series · 13 of 25 positions shown · non-contrast
Comparison: Prior thyroid ultrasound 03/03/2016

CLINICAL DATA: 65-year-old female with a history of bilateral
thyroid nodules. She underwent biopsy of a left-sided thyroid nodule
on 04/26/2016 which was positive for thyroid cancer. Subsequently
patient underwent a left thyroid lobectomy.

EXAM:
THYROID ULTRASOUND
TECHNIQUE: Ultrasound examination of the thyroid gland and adjacent soft
tissues was performed.

[Series 1: us soft tissue head/neck · 0.05mm/px · 13 of 40 slices shown]
[im 1/40]
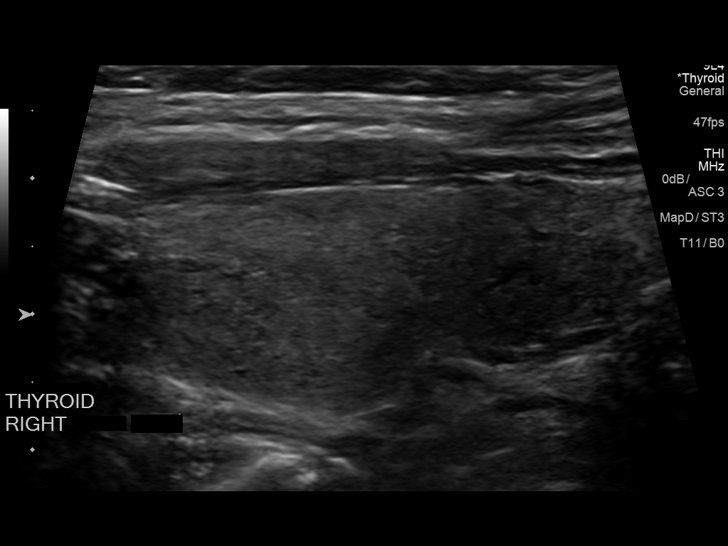
[im 4/40]
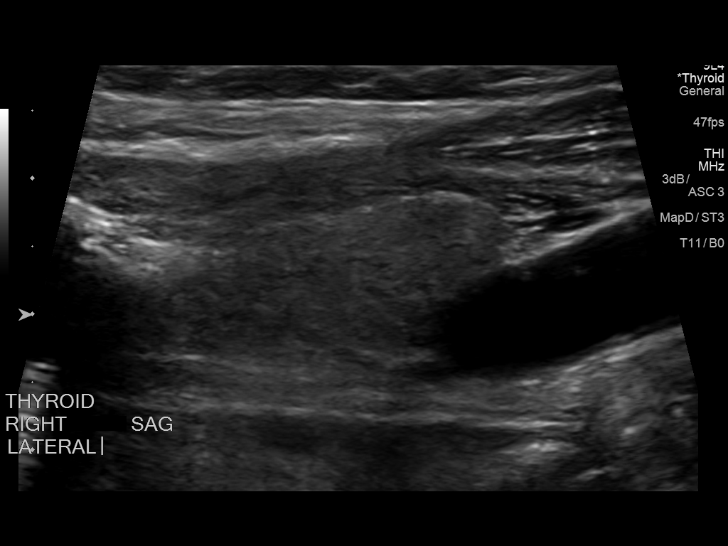
[im 7/40]
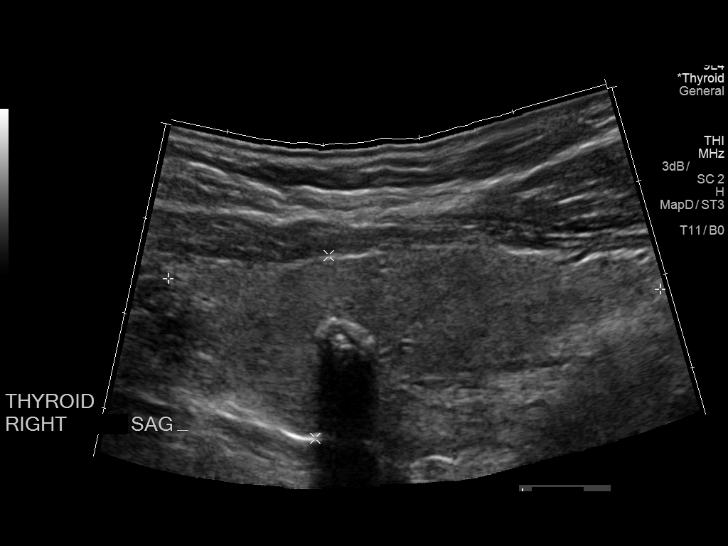
[im 10/40]
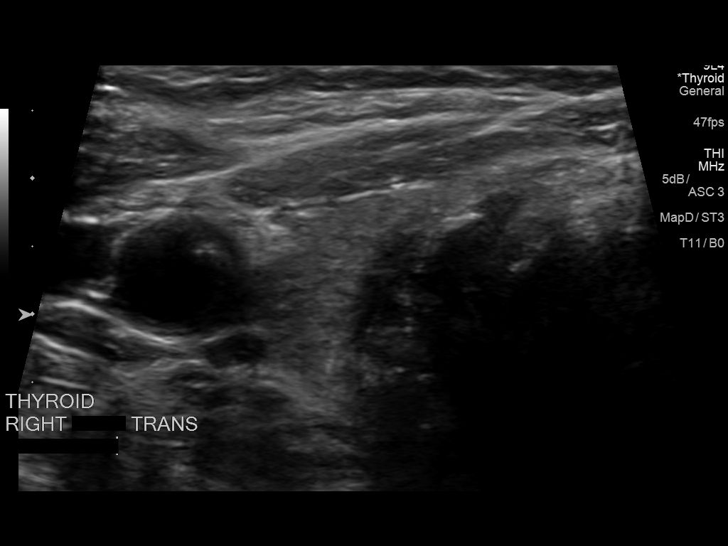
[im 14/40]
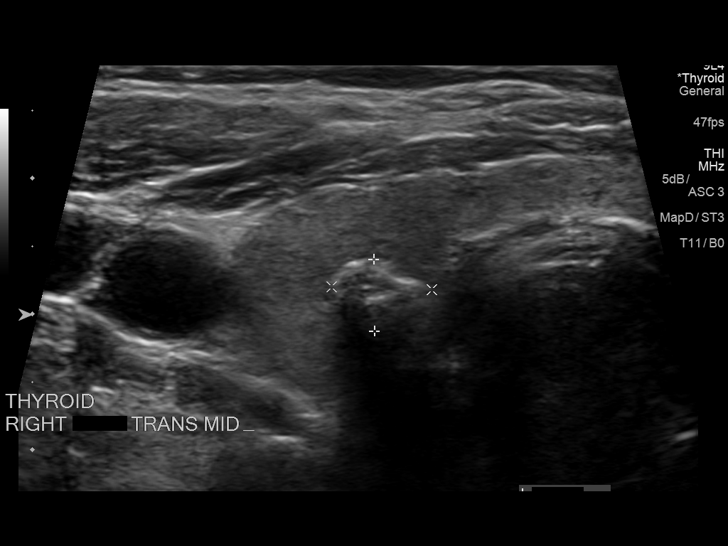
[im 17/40]
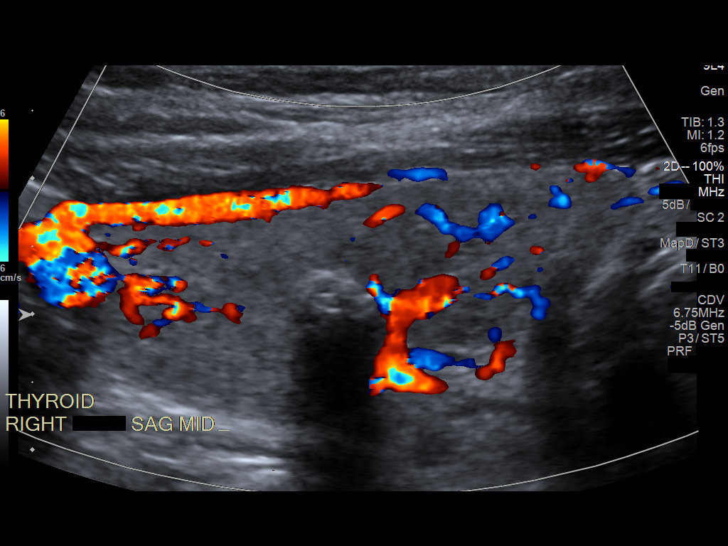
[im 20/40]
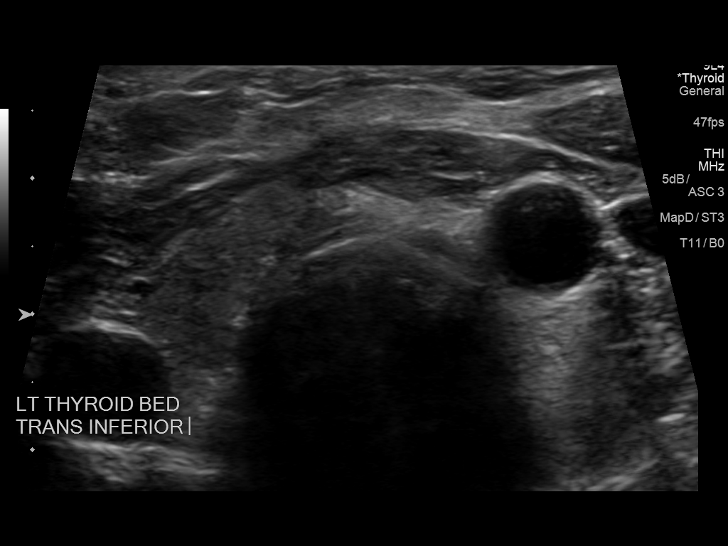
[im 23/40]
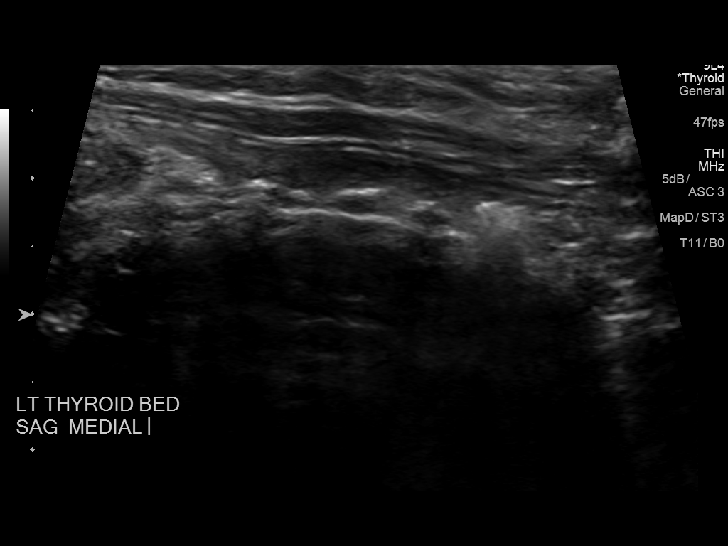
[im 27/40]
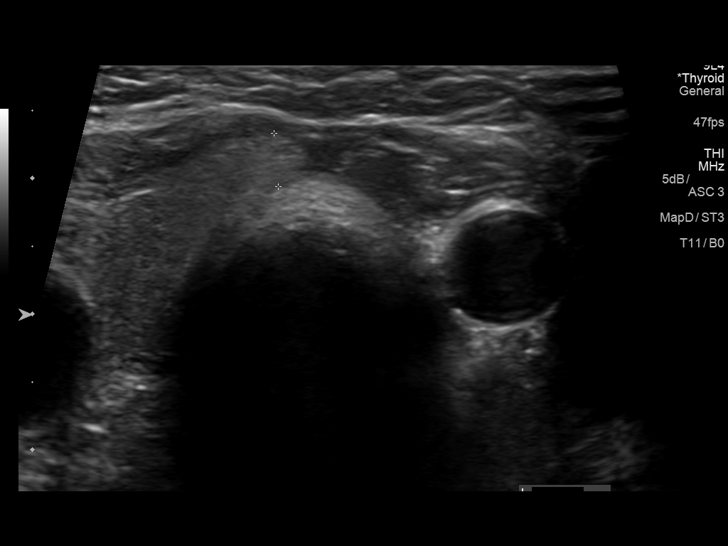
[im 30/40]
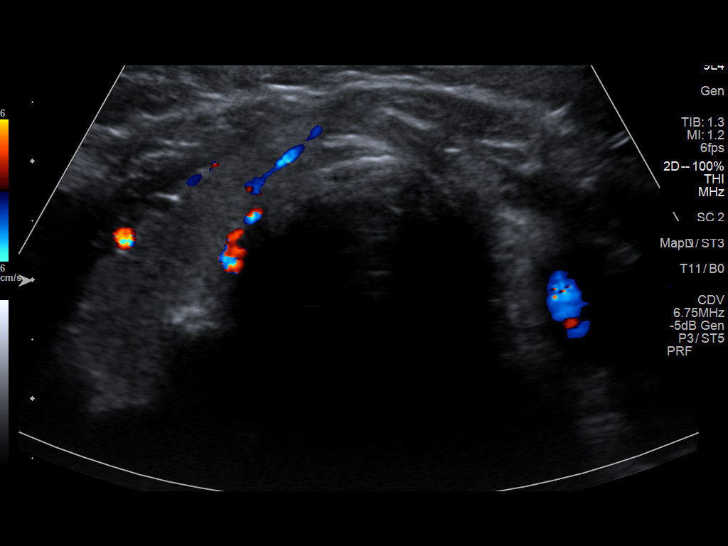
[im 33/40]
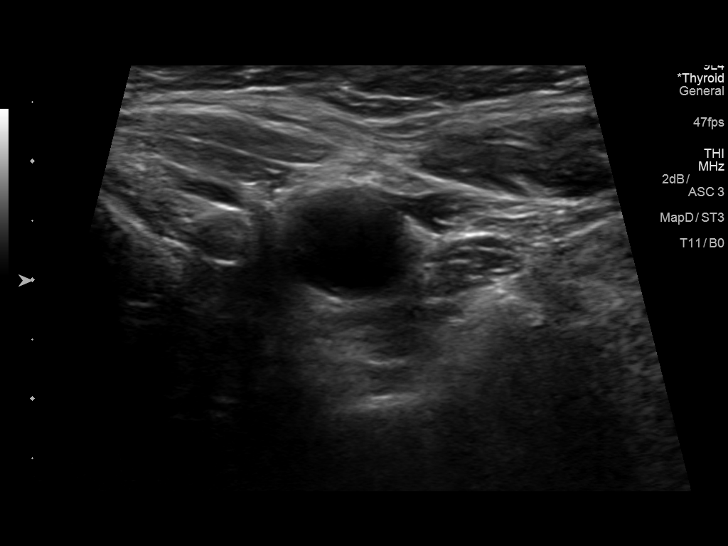
[im 36/40]
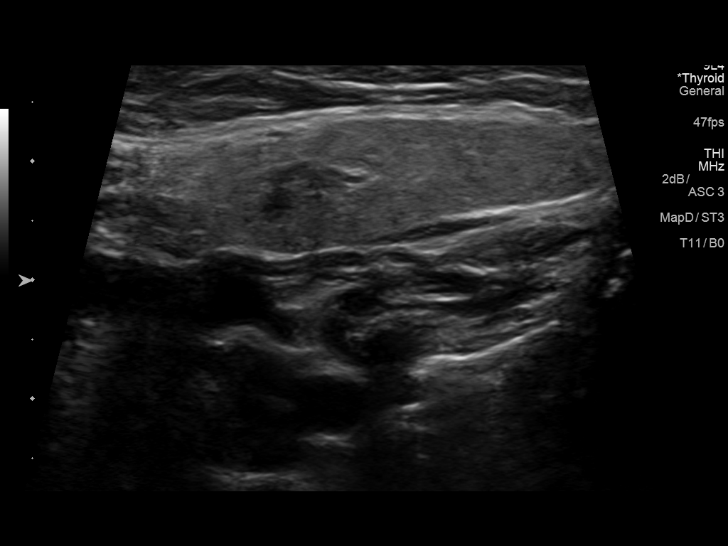
[im 40/40]
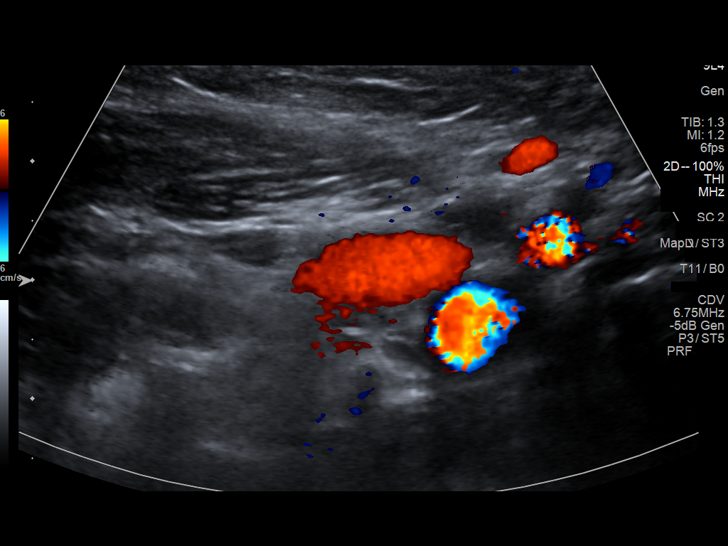

[13 of 25 positions shown; findings below may reference images not displayed]

FINDINGS: Parenchymal Echotexture: Mildly heterogenous

Isthmus: 0.4 cm

Right lobe: 5.1 x 1.9 x 1.6 cm

Left lobe: Surgically absent

_________________________________________________________

Estimated total number of nodules >/= 1 cm: 0

Number of spongiform nodules >/=  2 cm not described below (TR1): 0

Number of mixed cystic and solid nodules >/= 1.5 cm not described
below (TR2): 0

Nodule # 1:

Prior biopsy: No

Location: Right; Mid

Maximum size: 0.7 cm; Other 2 dimensions: 0.5 x 0.7 bold cm

Composition: cannot determine (2)

Echogenicity: cannot determine (1)

Shape: not taller-than-wide (0)

Margins: ill-defined (0)

Echogenic foci: macrocalcifications (1) + peripheral calcifications
(2)

ACR TI-RADS total points: 7.

ACR TI-RADS risk category:  TR5 (>/= 7 points).

Significant change in size (>/= 20% in two dimensions and minimal
increase of 2 mm): No

Change in features: No

Change in ACR TI-RADS risk category: No

ACR TI-RADS recommendations:

*Given size (>/= 0.5 - 0.9 cm) and appearance, a follow-up
ultrasound in 1 year should be considered based on TI-RADS criteria.

No evidence of residual or recurrent tissue in the right thyroid
bed.
IMPRESSION: 1. Stable small calcified nodule (TI-RADS 5) in the right mid gland.
Recommend continued annual ultrasound surveillance until 5 year
stability has been confirmed.
2. Surgical changes of prior left thyroidectomy.

The above is in keeping with the ACR TI-RADS recommendations - [HOSPITAL] 8524;[DATE].

## 2019-01-24 ENCOUNTER — Telehealth: Payer: Self-pay | Admitting: Physician Assistant

## 2019-01-24 ENCOUNTER — Other Ambulatory Visit: Payer: Self-pay

## 2019-01-24 MED ORDER — BUPROPION HCL ER (XL) 150 MG PO TB24: 150.0000 mg | ORAL_TABLET | Freq: Every day | ORAL | 0 refills | Status: DC

## 2019-01-24 NOTE — Telephone Encounter (Signed)
Becky Lawson called to request a medication to help with her depression.  She has appt 6/1 but doesn't feel she can wait.  She was a pt. Of [facility] and she stated they were trying to not add a new medication, but she thinks she going have to.  She is very derpessed.  Just sitting around not doing anything.  Not sleeping well.  Please call to discuss.

## 2019-01-24 NOTE — Telephone Encounter (Signed)
Thank you :)

## 2019-01-24 NOTE — Telephone Encounter (Signed)
Increase Wellbutrin XL to 450 mg.  I'd prefer that we maximize what she's already on before adding more drugs, and the Wellbutrin will give her more energy and motivation.

## 2019-01-24 NOTE — Telephone Encounter (Signed)
Pt agrees to increase to 450 mg of Wellbutrin XL. She has 300 mg at home will send in for 150 mg to add and she will follow up 06/01

## 2019-02-04 ENCOUNTER — Encounter: Payer: Self-pay | Admitting: Physician Assistant

## 2019-02-04 ENCOUNTER — Other Ambulatory Visit: Payer: Self-pay

## 2019-02-04 ENCOUNTER — Ambulatory Visit (INDEPENDENT_AMBULATORY_CARE_PROVIDER_SITE_OTHER): Payer: PPO | Admitting: Physician Assistant

## 2019-02-04 DIAGNOSIS — F411 Generalized anxiety disorder: Secondary | ICD-10-CM

## 2019-02-04 DIAGNOSIS — F3175 Bipolar disorder, in partial remission, most recent episode depressed: Secondary | ICD-10-CM | POA: Diagnosis not present

## 2019-02-04 MED ORDER — DULOXETINE HCL 30 MG PO CPEP: 30.0000 mg | ORAL_CAPSULE | Freq: Every day | ORAL | 1 refills | Status: DC

## 2019-02-04 MED ORDER — BUPROPION HCL ER (XL) 150 MG PO TB24: 150.0000 mg | ORAL_TABLET | Freq: Every day | ORAL | 0 refills | Status: DC
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
## 2019-02-05 ENCOUNTER — Other Ambulatory Visit: Payer: Self-pay | Admitting: Psychiatry

## 2019-02-11 DIAGNOSIS — Z79899 Other long term (current) drug therapy: Secondary | ICD-10-CM | POA: Diagnosis not present

## 2019-02-11 DIAGNOSIS — Z5181 Encounter for therapeutic drug level monitoring: Secondary | ICD-10-CM | POA: Diagnosis not present

## 2019-03-02 ENCOUNTER — Other Ambulatory Visit: Payer: Self-pay | Admitting: Physician Assistant

## 2019-03-20 ENCOUNTER — Ambulatory Visit (INDEPENDENT_AMBULATORY_CARE_PROVIDER_SITE_OTHER): Payer: PPO | Admitting: Family Medicine

## 2019-03-20 ENCOUNTER — Encounter: Payer: Self-pay | Admitting: Family Medicine

## 2019-03-20 ENCOUNTER — Other Ambulatory Visit: Payer: Self-pay

## 2019-03-20 VITALS — BP 140/67 | HR 47 | Temp 98.8°F | Resp 12 | Wt 175.2 lb

## 2019-03-20 DIAGNOSIS — J340 Abscess, furuncle and carbuncle of nose: Secondary | ICD-10-CM

## 2019-03-20 DIAGNOSIS — R21 Rash and other nonspecific skin eruption: Secondary | ICD-10-CM

## 2019-03-20 DIAGNOSIS — R51 Headache: Secondary | ICD-10-CM | POA: Diagnosis not present

## 2019-03-20 DIAGNOSIS — R519 Headache, unspecified: Secondary | ICD-10-CM

## 2019-03-20 LAB — POCT CBC
Granulocyte percent: 74.8 %G (ref 37–80)
HCT, POC: 40.7 % (ref 29–41)
Hemoglobin: 13.9 g/dL (ref 11–14.6)
Lymph, poc: 1.4 (ref 0.6–3.4)
MCH, POC: 31.9 pg — AB (ref 27–31.2)
MCHC: 34.1 g/dL (ref 31.8–35.4)
MCV: 93.5 fL (ref 76–111)
MID (cbc): 0.5 (ref 0–0.9)
MPV: 7 fL (ref 0–99.8)
POC Granulocyte: 5.5 (ref 2–6.9)
POC LYMPH PERCENT: 18.5 %L (ref 10–50)
POC MID %: 6.7 %M (ref 0–12)
Platelet Count, POC: 250 10*3/uL (ref 142–424)
RBC: 4.35 M/uL (ref 4.04–5.48)
RDW, POC: 12.8 %
WBC: 7.3 10*3/uL (ref 4.6–10.2)

## 2019-03-20 MED ORDER — DOXYCYCLINE HYCLATE 100 MG PO TABS: 100.0000 mg | ORAL_TABLET | Freq: Two times a day (BID) | ORAL | 0 refills | Status: DC

## 2019-03-20 MED ORDER — VALACYCLOVIR HCL 1 G PO TABS: 1000.0000 mg | ORAL_TABLET | Freq: Three times a day (TID) | ORAL | 0 refills | Status: DC

## 2019-03-20 MED ORDER — LIDOCAINE VISCOUS HCL 2 % MT SOLN: 15.0000 mL | OROMUCOSAL | 0 refills | Status: DC | PRN

## 2019-03-20 MED ORDER — CEFTRIAXONE SODIUM 1 G IJ SOLR
1.0000 g | Freq: Once | INTRAMUSCULAR | Status: AC
  Administered 2019-03-20: 1 g via INTRAMUSCULAR

## 2019-03-20 NOTE — Progress Notes (Signed)
Subjective:    Patient ID: Aricela Bertagnolli, female    DOB: Apr 28, 1951, 68 y.o.   MRN: [id number]  HPI Venus Gilles is a 68 y.o. female Presents today for: Chief Complaint  Patient presents with   Rash    Bump on right side of nose. Very painful started  week ago but worse in the last 3 days where it is untouchable. Would like to have some lidocaine to numb area if possible   Bump on R side of nose.  Noted about 6-7 days - small bump on inside of nostril. Black scab with white tissue. Uses q tip to clean out area since bump and some blood at times, otherwise not bleeding/ discharge.  More sore and swollen past 2-3 days.  Pain now into R side of face and to R temple/behind eye - plast week, then worse past few days. No blurry vision/eye irritation/darkening of vision or loss of vision.  No fever.  No other face rash.  Tx: orajel - min relief. Hydrocodone.   Cellulitis in corner of R eye - noted some blisters on inner corner of eye - started with 1 blister that morphed into multiple blisters.  Cold sores in past. Usually on upper lip, last flare 3 weeks ago. Treated with Lysine.   On hydrocodone 10/325mg  4 times per day.  (usually 1-2 per day for spinal stenosis). On xanax once at night. Aware of risk of accidental overdose with combo - followed by pain mgt.     Patient Active Problem List   Diagnosis Date Noted   Tremor 07/12/2018   Prediabetes 07/12/2018   Polypharmacy 07/12/2018   Unintentional weight loss of more than 10 pounds 07/12/2018   OSA (obstructive sleep apnea) 06/02/2018   Fatty liver 04/18/2017   GERD (gastroesophageal reflux disease) 10/28/2016   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   Cough 06/24/2016   S/P partial thyroidectomy 06/16/2016   Multinodular goiter 06/02/2016   Thyroid nodule 02/08/2016   History of hepatitis C 05/19/2015   Vitamin D deficiency 05/19/2015   Spinal stenosis of lumbar region 05/19/2015   Hyperlipidemia 05/19/2015    Bipolar disorder ([place]) 05/19/2015   Essential hypertension 09/26/2013   Undiagnosed cardiac murmurs 09/26/2013   Past Medical History:  Diagnosis Date   Anxiety    Arthritis    Benign cyst of right kidney 2014   2.7 cm right kidney cyst seen on imaging 2014 but was c/o right flank pain with new persistent proteinuria (fortunately hematuria had resolved) so repeat US 11/2016 showed right kidney still with 2.6 cm simple cyst and otherwise nml.   Bipolar 1 disorder (HCC)    Chronic kidney disease    Depression    GERD (gastroesophageal reflux disease)    Heart murmur    Hepatitis C    resolved completely after treatment in 2014, genotype 1b, followed at [facility]   Hyperlipidemia    Hypertension    Jaundice 11/01/2012   Pruritic disorder 02/13/2013   Sleep apnea    Was diagnosed approximately 20 years ago, but does not wear CPAP patient stated "I gave it back.Marland KitchenMarland KitchenI couldn't wear that thing it was horrible"   Spinal stenosis    Past Surgical History:  Procedure Laterality Date   ABDOMINAL HYSTERECTOMY     COLONOSCOPY W/ POLYPECTOMY     JOINT REPLACEMENT Bilateral    knee   KNEE ARTHROSCOPY Bilateral    PARTIAL HYSTERECTOMY     THYROID LOBECTOMY Left 06/16/2016   THYROID LOBECTOMY Left 06/16/2016  Procedure: LEFT THYROID LOBECTOMY;  Surgeon: Greer Pickerel, MD;  Location: [place];  Service: General;  Laterality: Left;   Allergies  Allergen Reactions   No Known Allergies    Prior to Admission medications   Medication Sig Start Date End Date Taking? Authorizing Provider  ALPRAZolam Duanne Moron) 1 MG tablet 1 hs and I prn 01/09/19  Yes Hurst, Teresa T, PA-C  amLODipine (NORVASC) 10 MG tablet Take 1 tablet (10 mg total) by mouth daily. 07/12/18  Yes Shawnee Knapp, MD  buPROPion (WELLBUTRIN XL) 150 MG 24 hr tablet Take 1 tablet (150 mg total) by mouth daily. Take in addition to the 300 mg to equal 450 mg/day. 02/04/19  Yes Hurst, Dorothea Glassman, PA-C  buPROPion (WELLBUTRIN XL) 300 MG 24  hr tablet Take 1 tablet (300 mg total) by mouth daily. 01/06/19  Yes Cottle, Billey Co., MD  candesartan (ATACAND) 32 MG tablet Take 1 tablet (32 mg total) by mouth daily. 07/12/18  Yes Shawnee Knapp, MD  carbidopa-levodopa (SINEMET IR) 25-100 MG tablet TAKE 1 TABLET BY MOUTH EVERYDAY AT BEDTIME 08/21/18  Yes Shawnee Knapp, MD  Cholecalciferol (VITAMIN D-3) 5000 units TABS Take 5,000 Units daily by mouth. In addition to the rx high dose once weekly vitamin D of 50Ku. 07/24/17  Yes Shawnee Knapp, MD  Coenzyme Q10 (CO Q-10) 300 MG CAPS Take 1 capsule daily by mouth.   Yes [provider]  docusate sodium (COLACE) 100 MG capsule Take 1 capsule (100 mg total) by mouth 2 (two) times daily. 05/13/17  Yes Shawnee Knapp, MD  DULoxetine (CYMBALTA) 30 MG capsule TAKE 1 CAPSULE BY MOUTH EVERY DAY 03/04/19  Yes Hurst, Teresa T, PA-C  Ginkgo Biloba 40 MG TABS Take 1 tablet daily by mouth.   Yes [provider]  HYDROcodone-acetaminophen (NORCO) 10-325 MG tablet Take 1 tablet by mouth 3 (three) times daily.    Yes [provider]  hydrOXYzine (ATARAX/VISTARIL) 25 MG tablet Take 2 tablets (50 mg total) by mouth every 6 (six) hours as needed for anxiety or itching. 10/03/18  Yes Shugart, [name], PA-C  lamoTRIgine (LAMICTAL) 200 MG tablet TAKE 1 TABLET BY MOUTH TWICE A DAY 02/05/19  Yes Hurst, Teresa T, PA-C  levothyroxine (SYNTHROID, LEVOTHROID) 100 MCG tablet Take 1 tablet (100 mcg total) by mouth daily before breakfast. 07/12/18  Yes Shawnee Knapp, MD  magnesium oxide (MAG-OX) 400 MG tablet Take 400 mg by mouth daily.   Yes [provider]  Omega-3 Fatty Acids (FISH OIL) 1200 MG CAPS Take by mouth.   Yes [provider]  omeprazole (PRILOSEC) 40 MG capsule TAKE 1 CAPSULE BY MOUTH EVERY DAY AS NEEDED FOR INDIGESTION OR HEARTBURN 12/27/18  Yes Shawnee Knapp, MD  pravastatin (PRAVACHOL) 40 MG tablet Take 1 tablet (40 mg total) by mouth daily. 07/12/18  Yes Shawnee Knapp, MD  traZODone (DESYREL) 100  MG tablet Take 3 tablets (300 mg total) by mouth daily. 10/03/18  Yes Shugart, [name], PA-C  triamcinolone cream (KENALOG) 0.1 % Apply 1 application topically 4 (four) times daily. 07/12/18  Yes Shawnee Knapp, MD  Vitamin D, Ergocalciferol, (DRISDOL) 1.25 MG (50000 UT) CAPS capsule TAKE 1 CAPSULE (50,000 UNITS TOTAL) BY MOUTH 2 (TWO) TIMES A WEEK. 10/08/18  Yes Shawnee Knapp, MD  vitamin E 600 UNIT capsule Take 600 Units by mouth daily.   Yes [provider]   Social History   Socioeconomic History   Marital status: Divorced  Spouse name: Not on file   Number of children: Not on file   Years of education: Not on file   Highest education level: Not on file  Occupational History   Occupation: Geophysical data processor  Social Needs   Financial resource strain: Not on file   Food insecurity    Worry: Not on file    Inability: Not on file   Transportation needs    Medical: Not on file    Non-medical: Not on file  Tobacco Use   Smoking status: Former Smoker    Packs/day: 1.00    Years: 15.00    Pack years: 15.00    Quit date: 06/30/1979    Years since quitting: 39.7   Smokeless tobacco: Never Used  Substance and Sexual Activity   Alcohol use: Yes    Alcohol/week: 0.0 - 1.0 standard drinks   Drug use: No   Sexual activity: Yes    Birth control/protection: None  Lifestyle   Physical activity    Days per week: Not on file    Minutes per session: Not on file   Stress: Not on file  Relationships   Social connections    Talks on phone: Not on file    Gets together: Not on file    Attends religious service: Not on file    Active member of club or organization: Not on file    Attends meetings of clubs or organizations: Not on file    Relationship status: Not on file   Intimate partner violence    Fear of current or ex partner: Not on file    Emotionally abused: Not on file    Physically abused: Not on file    Forced sexual activity: Not on file  Other Topics  Concern   Not on file  Social History Narrative   Divorced. Education: [employer]. Exercise: No.    Review of Systems       Objective:   Physical Exam Vitals signs and nursing note reviewed.  Constitutional:      General: She is not in acute distress.    Appearance: She is well-developed.  HENT:     Head: Normocephalic and atraumatic.      Nose:      Comments: Other than distal right nostril, no facial swelling or vesicles.  Patient did pull scab off of external vesicle on her own, culture obtained from blood from area, no exudate otherwise. Cardiovascular:     Rate and Rhythm: Normal rate.  Pulmonary:     Effort: Pulmonary effort is normal.     Breath sounds: Normal breath sounds.  Lymphadenopathy:     Cervical: No cervical adenopathy.  Skin:    General: Skin is warm and dry.  Neurological:     Mental Status: She is alert and oriented to person, place, and time.    Vitals:   03/20/19 1514  BP: 140/67  Pulse: (!) 47  Resp: 12  Temp: 98.8 F (37.1 C)  TempSrc: Oral  SpO2: 97%  Weight: 175 lb 3.2 oz (79.5 kg)       Assessment & Plan:    Leilana Mcquire is a 68 y.o. 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  Right side of face pain may be related to nasal pain but no obvious sign of deep space infection at present.  No vesicular rash over remainder of face, less likely shingles.  -Sed rate reassuring, unlikely temporal  arteritis.  -Start doxycycline and initial Rocephin 1 g IM given for secondary cellulitis.  Culture obtained from the open area.  -Cover for HSV with Valtrex, higher dose prescribed for possible zoster coverage but less likely as above  -Recheck in 48 hours, ER precautions if any worsening of symptoms sooner.  Meds ordered this encounter  Medications   valACYclovir (VALTREX) 1000 MG tablet    Sig: Take 1 tablet (1,000 mg total) by mouth 3 (three) times daily.    Dispense:  21 tablet    Refill:  0   doxycycline (VIBRA-TABS) 100 MG tablet    Sig: Take 1 tablet (100 mg total) by mouth 2 (two) times daily.    Dispense:  20 tablet    Refill:  0   lidocaine (XYLOCAINE) 2 % solution    Sig: Use as directed 15 mLs in the mouth or throat as needed (topical tid prn to nasal wound.).    Dispense:  15 mL    Refill:  0   cefTRIAXone (ROCEPHIN) injection 1 g   Patient Instructions    Start Valtrex for possible cold sore virus or less likely shingles infection but also start antibiotic as you may have a secondary cellulitis or bacterial infection.  I will check some other blood work including for the right-sided headache, but that may be related to the pain in the nose.  If any vision changes, worsening swelling of the nose or into the face, worsening redness, fevers, or other worsening symptoms, proceed to the emergency room.  Otherwise follow-up with me in 48 hours and at that time can decide if change in treatment needed. Lidocaine topical if needed.    Return to the clinic or go to the nearest emergency room if any of your symptoms worsen or new symptoms occur.    Cellulitis, Adult  Cellulitis is a skin infection. The infected area is usually warm, red, swollen, and tender. This condition occurs most often in the arms and lower legs. The infection can travel to the muscles, blood, and underlying tissue and become serious. It is very important to get treated for this condition. What are the  causes? Cellulitis is caused by bacteria. The bacteria enter through a break in the skin, such as a cut, burn, insect bite, open sore, or crack. What increases the risk? This condition is more likely to occur in people who:  Have a weak body defense system (immune system).  Have open wounds on the skin, such as cuts, burns, bites, and scrapes. Bacteria can enter the body through these open wounds.  Are older than 68 years of age.  Have diabetes.  Have a type of long-lasting (chronic) liver disease (cirrhosis) or kidney disease.  Are obese.  Have a skin condition such as: ? Itchy rash (eczema). ? Slow movement of blood in the veins (venous stasis). ? Fluid buildup below the skin (edema).  Have had radiation therapy.  Use IV drugs. What are the signs or symptoms? Symptoms of this condition include:  Redness, streaking, or spotting on the skin.  Swollen area  of the skin.  Tenderness or pain when an area of the skin is touched.  Warm skin.  A fever.  Chills.  Blisters. How is this diagnosed? This condition is diagnosed based on a medical history and physical exam. You may also have tests, including:  Blood tests.  Imaging tests. How is this treated? Treatment for this condition may include:  Medicines, such as antibiotic medicines or medicines to treat allergies (antihistamines).  Supportive care, such as rest and application of cold or warm cloths (compresses) to the skin.  Hospital care, if the condition is severe. The infection usually starts to get better within 1-2 days of treatment. Follow these instructions at home:  Medicines  Take over-the-counter and prescription medicines only as told by your health care provider.  If you were prescribed an antibiotic medicine, take it as told by your health care provider. Do not stop taking the antibiotic even if you start to feel better. General instructions  Drink enough fluid to keep your urine pale  yellow.  Do not touch or rub the infected area.  Raise (elevate) the infected area above the level of your heart while you are sitting or lying down.  Apply warm or cold compresses to the affected area as told by your health care provider.  Keep all follow-up visits as told by your health care provider. This is important. These visits let your health care provider make sure a more serious infection is not developing. Contact a health care provider if:  You have a fever.  Your symptoms do not begin to improve within 1-2 days of starting treatment.  Your bone or joint underneath the infected area becomes painful after the skin has healed.  Your infection returns in the same area or another area.  You notice a swollen bump in the infected area.  You develop new symptoms.  You have a general ill feeling (malaise) with muscle aches and pains. Get help right away if:  Your symptoms get worse.  You feel very sleepy.  You develop vomiting or diarrhea that persists.  You notice red streaks coming from the infected area.  Your red area gets larger or turns dark in color. These symptoms may represent a serious problem that is an emergency. Do not wait to see if the symptoms will go away. Get medical help right away. Call your local emergency services (911 in the [place] Do not drive yourself to the hospital. Summary  Cellulitis is a skin infection. This condition occurs most often in the arms and lower legs.  Treatment for this condition may include medicines, such as antibiotic medicines or antihistamines.  Take over-the-counter and prescription medicines only as told by your health care provider. If you were prescribed an antibiotic medicine, do not stop taking the antibiotic even if you start to feel better.  Contact a health care provider if your symptoms do not begin to improve within 1-2 days of starting treatment or your symptoms get worse.  Keep all follow-up visits as told  by your health care provider. This is important. These visits let your health care provider make sure that a more serious infection is not developing. This information is not intended to replace advice given to you by your health care provider. Make sure you discuss any questions you have with your health care provider. Document Released: 06/01/2005 Document Revised: 01/11/2018 Document Reviewed: 01/11/2018 Elsevier Patient Education  [employer].    If you have lab work done today you will  be contacted with your lab results within the next 2 weeks.  If you have not heard from [place] then please contact us. The fastest way to get your results is to register for My Chart.   IF you received an x-ray today, you will receive an invoice from [facility]. Please contact [facility] at [phone] with questions or concerns regarding your invoice.   IF you received labwork today, you will receive an invoice from [facility]. Please contact [facility] at [phone] with questions or concerns regarding your invoice.   Our billing staff will not be able to assist you with questions regarding bills from these companies.  You will be contacted with the lab results as soon as they are available. The fastest way to get your results is to activate your My Chart account. Instructions are located on the last page of this paperwork. If you have not heard from [place] regarding the results in 2 weeks, please contact this office.       Signed,   Merri Ray, MD [facility].  03/21/19 10:29 PM

## 2019-03-20 NOTE — Patient Instructions (Addendum)
Start Valtrex for possible cold sore virus or less likely shingles infection but also start antibiotic as you may have a secondary cellulitis or bacterial infection.  I will check some other blood work including for the right-sided headache, but that may be related to the pain in the nose.  If any vision changes, worsening swelling of the nose or into the face, worsening redness, fevers, or other worsening symptoms, proceed to the emergency room.  Otherwise follow-up with me in 48 hours and at that time can decide if change in treatment needed. Lidocaine topical if needed.    Return to the clinic or go to the nearest emergency room if any of your symptoms worsen or new symptoms occur.    Cellulitis, Adult  Cellulitis is a skin infection. The infected area is usually warm, red, swollen, and tender. This condition occurs most often in the arms and lower legs. The infection can travel to the muscles, blood, and underlying tissue and become serious. It is very important to get treated for this condition. What are the causes? Cellulitis is caused by bacteria. The bacteria enter through a break in the skin, such as a cut, burn, insect bite, open sore, or crack. What increases the risk? This condition is more likely to occur in people who:  Have a weak body defense system (immune system).  Have open wounds on the skin, such as cuts, burns, bites, and scrapes. Bacteria can enter the body through these open wounds.  Are older than 68 years of age.  Have diabetes.  Have a type of long-lasting (chronic) liver disease (cirrhosis) or kidney disease.  Are obese.  Have a skin condition such as: ? Itchy rash (eczema). ? Slow movement of blood in the veins (venous stasis). ? Fluid buildup below the skin (edema).  Have had radiation therapy.  Use IV drugs. What are the signs or symptoms? Symptoms of this condition include:  Redness, streaking, or spotting on the skin.  Swollen area of the  skin.  Tenderness or pain when an area of the skin is touched.  Warm skin.  A fever.  Chills.  Blisters. How is this diagnosed? This condition is diagnosed based on a medical history and physical exam. You may also have tests, including:  Blood tests.  Imaging tests. How is this treated? Treatment for this condition may include:  Medicines, such as antibiotic medicines or medicines to treat allergies (antihistamines).  Supportive care, such as rest and application of cold or warm cloths (compresses) to the skin.  Hospital care, if the condition is severe. The infection usually starts to get better within 1-2 days of treatment. Follow these instructions at home:  Medicines  Take over-the-counter and prescription medicines only as told by your health care provider.  If you were prescribed an antibiotic medicine, take it as told by your health care provider. Do not stop taking the antibiotic even if you start to feel better. General instructions  Drink enough fluid to keep your urine pale yellow.  Do not touch or rub the infected area.  Raise (elevate) the infected area above the level of your heart while you are sitting or lying down.  Apply warm or cold compresses to the affected area as told by your health care provider.  Keep all follow-up visits as told by your health care provider. This is important. These visits let your health care provider make sure a more serious infection is not developing. Contact a health care provider if:  You  have a fever.  Your symptoms do not begin to improve within 1-2 days of starting treatment.  Your bone or joint underneath the infected area becomes painful after the skin has healed.  Your infection returns in the same area or another area.  You notice a swollen bump in the infected area.  You develop new symptoms.  You have a general ill feeling (malaise) with muscle aches and pains. Get help right away if:  Your  symptoms get worse.  You feel very sleepy.  You develop vomiting or diarrhea that persists.  You notice red streaks coming from the infected area.  Your red area gets larger or turns dark in color. These symptoms may represent a serious problem that is an emergency. Do not wait to see if the symptoms will go away. Get medical help right away. Call your local emergency services (911 in the [place] Do not drive yourself to the hospital. Summary  Cellulitis is a skin infection. This condition occurs most often in the arms and lower legs.  Treatment for this condition may include medicines, such as antibiotic medicines or antihistamines.  Take over-the-counter and prescription medicines only as told by your health care provider. If you were prescribed an antibiotic medicine, do not stop taking the antibiotic even if you start to feel better.  Contact a health care provider if your symptoms do not begin to improve within 1-2 days of starting treatment or your symptoms get worse.  Keep all follow-up visits as told by your health care provider. This is important. These visits let your health care provider make sure that a more serious infection is not developing. This information is not intended to replace advice given to you by your health care provider. Make sure you discuss any questions you have with your health care provider. Document Released: 06/01/2005 Document Revised: 01/11/2018 Document Reviewed: 01/11/2018 Elsevier Patient Education  [employer].    If you have lab work done today you will be contacted with your lab results within the next 2 weeks.  If you have not heard from [place] then please contact us. The fastest way to get your results is to register for My Chart.   IF you received an x-ray today, you will receive an invoice from [facility]. Please contact [facility] at [phone] with questions or concerns regarding your invoice.   IF you received  labwork today, you will receive an invoice from [place]. Please contact [facility] at [phone] with questions or concerns regarding your invoice.   Our billing staff will not be able to assist you with questions regarding bills from these companies.  You will be contacted with the lab results as soon as they are available. The fastest way to get your results is to activate your My Chart account. Instructions are located on the last page of this paperwork. If you have not heard from [place] regarding the results in 2 weeks, please contact this office.

## 2019-03-21 ENCOUNTER — Encounter: Payer: Self-pay | Admitting: Family Medicine

## 2019-03-21 LAB — SEDIMENTATION RATE: Sed Rate: 23 mm/hr (ref 0–40)

## 2019-03-22 ENCOUNTER — Encounter: Payer: Self-pay | Admitting: Family Medicine

## 2019-03-22 ENCOUNTER — Other Ambulatory Visit: Payer: Self-pay

## 2019-03-22 ENCOUNTER — Ambulatory Visit (INDEPENDENT_AMBULATORY_CARE_PROVIDER_SITE_OTHER): Payer: PPO | Admitting: Family Medicine

## 2019-03-22 VITALS — BP 120/60 | HR 91 | Temp 99.1°F | Resp 17 | Ht 64.0 in | Wt 178.2 lb

## 2019-03-22 DIAGNOSIS — J340 Abscess, furuncle and carbuncle of nose: Secondary | ICD-10-CM

## 2019-03-22 LAB — WOUND CULTURE: Organism ID, Bacteria: NONE SEEN

## 2019-03-22 NOTE — Progress Notes (Signed)
Subjective:    Patient ID: Becky Lawson, female    DOB: 02-Aug-1951, 68 y.o.   MRN: [id number]  HPI Becky Lawson is a 68 y.o. female Presents today for: Chief Complaint  Patient presents with  . 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  Treated with Rocephin 1 g x 1, then doxycycline 100 mg twice daily, Valtrex 1 g 3 times daily.  Viscous lidocaine topical as needed.  Able to express pus from inside nose. Swelling decreased in half. 2 small blisters noted that night. Broke open with yellow pus. Culture - MRSA, sensitive to doxycycline.  No measured fever.no chills.  Pain has improved.   Tried viscous lidocaine once No new side effects with abx/antiviral.  R face pain has resolved.  Sed rate nml last visit.  Results for orders placed or performed in visit on 03/20/19  WOUND CULTURE   Specimen: Wound   NO  Result Value Ref Range   Gram Stain Result Final report    Organism ID, Bacteria Comment    Organism ID, Bacteria No organisms seen    Aerobic Bacterial Culture Final report (A)    Organism ID, Bacteria Staphylococcus aureus (A)    Antimicrobial Susceptibility Comment   Sedimentation Rate  Result Value Ref Range   Sed Rate 23 0 - 40 mm/hr  POCT CBC  Result Value Ref Range   WBC 7.3 4.6 - 10.2 K/uL   Lymph, poc 1.4 0.6 - 3.4   POC LYMPH PERCENT 18.5 10 - 50 %L   MID (cbc) 0.5 0 - 0.9   POC MID % 6.7 0 - 12 %M   POC Granulocyte 5.5 2 - 6.9   Granulocyte percent 74.8 37 - 80 %G   RBC 4.35 4.04 - 5.48 M/uL   Hemoglobin 13.9 11 - 14.6 g/dL   HCT, POC 40.7 29 - 41 %   MCV 93.5 76 - 111 fL   MCH, POC 31.9 (A) 27 - 31.2 pg   MCHC 34.1 31.8 - 35.4 g/dL   RDW, POC 12.8 %   Platelet Count, POC 250 142 - 424 K/uL   MPV 7.0 0 - 99.8 fL      Patient Active Problem List   Diagnosis Date Noted  . Tremor 07/12/2018  .  Prediabetes 07/12/2018  . Polypharmacy 07/12/2018  . Unintentional weight loss of more than 10 pounds 07/12/2018  . OSA (obstructive sleep apnea) 06/02/2018  . Fatty liver 04/18/2017  . GERD (gastroesophageal reflux disease) 10/28/2016  . 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 Cough 06/24/2016  . S/P partial thyroidectomy 06/16/2016  . Multinodular goiter 06/02/2016  . Thyroid nodule 02/08/2016  . History of hepatitis C 05/19/2015  . Vitamin D deficiency 05/19/2015  . Spinal stenosis of lumbar region 05/19/2015  . Hyperlipidemia 05/19/2015  . Bipolar disorder ([place]) 05/19/2015  . Essential hypertension 09/26/2013  . Undiagnosed cardiac murmurs 09/26/2013   Past Medical History:  Diagnosis Date  . Anxiety   . Arthritis   . Benign cyst of right kidney 2014   2.7 cm right kidney cyst seen on imaging 2014 but was c/o right flank pain with new persistent proteinuria (fortunately hematuria had resolved) so repeat US 11/2016 showed right kidney still with 2.6 cm simple cyst and otherwise nml.  . Bipolar 1 disorder ([place])   .  Chronic kidney disease   . Depression   . GERD (gastroesophageal reflux disease)   . Heart murmur   . Hepatitis C    resolved completely after treatment in 2014, genotype 1b, followed at [facility]  . Hyperlipidemia   . Hypertension   . Jaundice 11/01/2012  . Pruritic disorder 02/13/2013  . Sleep apnea    Was diagnosed approximately 20 years ago, but does not wear CPAP patient stated "I gave it back.Marland KitchenMarland KitchenI couldn't wear that thing it was horrible"  . Spinal stenosis    Past Surgical History:  Procedure Laterality Date  . ABDOMINAL HYSTERECTOMY    . COLONOSCOPY W/ POLYPECTOMY    . JOINT REPLACEMENT Bilateral    knee  . KNEE ARTHROSCOPY Bilateral   . PARTIAL HYSTERECTOMY    . THYROID LOBECTOMY Left 06/16/2016  . THYROID LOBECTOMY Left 06/16/2016   Procedure: LEFT THYROID LOBECTOMY;  Surgeon: Greer Pickerel, MD;  Location: [place];  Service: General;  Laterality: Left;    Allergies  Allergen Reactions  . No Known Allergies    Prior to Admission medications   Medication Sig Start Date End Date Taking? Authorizing Provider  ALPRAZolam Duanne Moron) 1 MG tablet 1 hs and I prn 01/09/19  Yes Hurst, Teresa T, PA-C  amLODipine (NORVASC) 10 MG tablet Take 1 tablet (10 mg total) by mouth daily. 07/12/18  Yes Shawnee Knapp, MD  buPROPion (WELLBUTRIN XL) 150 MG 24 hr tablet Take 1 tablet (150 mg total) by mouth daily. Take in addition to the 300 mg to equal 450 mg/day. 02/04/19  Yes Hurst, Dorothea Glassman, PA-C  buPROPion (WELLBUTRIN XL) 300 MG 24 hr tablet Take 1 tablet (300 mg total) by mouth daily. 01/06/19  Yes Cottle, Billey Co., MD  candesartan (ATACAND) 32 MG tablet Take 1 tablet (32 mg total) by mouth daily. 07/12/18  Yes Shawnee Knapp, MD  carbidopa-levodopa (SINEMET IR) 25-100 MG tablet TAKE 1 TABLET BY MOUTH EVERYDAY AT BEDTIME 08/21/18  Yes Shawnee Knapp, MD  Cholecalciferol (VITAMIN D-3) 5000 units TABS Take 5,000 Units daily by mouth. In addition to the rx high dose once weekly vitamin D of 50Ku. 07/24/17  Yes Shawnee Knapp, MD  Coenzyme Q10 (CO Q-10) 300 MG CAPS Take 1 capsule daily by mouth.   Yes [provider]  docusate sodium (COLACE) 100 MG capsule Take 1 capsule (100 mg total) by mouth 2 (two) times daily. 05/13/17  Yes Shawnee Knapp, MD  doxycycline (VIBRA-TABS) 100 MG tablet Take 1 tablet (100 mg total) by mouth 2 (two) times daily. 03/20/19  Yes Wendie Agreste, MD  DULoxetine (CYMBALTA) 30 MG capsule TAKE 1 CAPSULE BY MOUTH EVERY DAY 03/04/19  Yes Hurst, Teresa T, PA-C  Ginkgo Biloba 40 MG TABS Take 1 tablet daily by mouth.   Yes [provider]  HYDROcodone-acetaminophen (NORCO) 10-325 MG tablet Take 1 tablet by mouth 3 (three) times daily.    Yes [provider]  hydrOXYzine (ATARAX/VISTARIL) 25 MG tablet Take 2 tablets (50 mg total) by mouth every 6 (six) hours as needed for anxiety or itching. 10/03/18  Yes Shugart, [name], PA-C  lamoTRIgine  (LAMICTAL) 200 MG tablet TAKE 1 TABLET BY MOUTH TWICE A DAY 02/05/19  Yes Hurst, Teresa T, PA-C  levothyroxine (SYNTHROID, LEVOTHROID) 100 MCG tablet Take 1 tablet (100 mcg total) by mouth daily before breakfast. 07/12/18  Yes Shawnee Knapp, MD  lidocaine (XYLOCAINE) 2 % solution Use as directed 15 mLs in the mouth or throat  as needed (topical tid prn to nasal wound.). 03/20/19  Yes Wendie Agreste, MD  magnesium oxide (MAG-OX) 400 MG tablet Take 400 mg by mouth daily.   Yes [provider]  omeprazole (PRILOSEC) 40 MG capsule TAKE 1 CAPSULE BY MOUTH EVERY DAY AS NEEDED FOR INDIGESTION OR HEARTBURN 12/27/18  Yes Shawnee Knapp, MD  pravastatin (PRAVACHOL) 40 MG tablet Take 1 tablet (40 mg total) by mouth daily. 07/12/18  Yes Shawnee Knapp, MD  traZODone (DESYREL) 100 MG tablet Take 3 tablets (300 mg total) by mouth daily. 10/03/18  Yes Shugart, [name], PA-C  valACYclovir (VALTREX) 1000 MG tablet Take 1 tablet (1,000 mg total) by mouth 3 (three) times daily. 03/20/19  Yes Wendie Agreste, MD  Vitamin D, Ergocalciferol, (DRISDOL) 1.25 MG (50000 UT) CAPS capsule TAKE 1 CAPSULE (50,000 UNITS TOTAL) BY MOUTH 2 (TWO) TIMES A WEEK. 10/08/18  Yes Shawnee Knapp, MD  vitamin E 600 UNIT capsule Take 600 Units by mouth daily.   Yes [provider]  Omega-3 Fatty Acids (FISH OIL) 1200 MG CAPS Take by mouth.    [provider]  triamcinolone cream (KENALOG) 0.1 % Apply 1 application topically 4 (four) times daily. Patient not taking: Reported on 03/22/2019 07/12/18   Shawnee Knapp, MD   Social History   Socioeconomic History  . Marital status: Divorced    Spouse name: Not on file  . Number of children: Not on file  . Years of education: Not on file  . Highest education level: Not on file  Occupational History  . Occupation: Geophysical data processor  Social Needs  . Financial resource strain: Not on file  . Food insecurity    Worry: Not on file    Inability: Not on file  . Transportation needs     Medical: Not on file    Non-medical: Not on file  Tobacco Use  . Smoking status: Former Smoker    Packs/day: 1.00    Years: 15.00    Pack years: 15.00    Quit date: 06/30/1979    Years since quitting: 39.7  . Smokeless tobacco: Never Used  Substance and Sexual Activity  . Alcohol use: Yes    Alcohol/week: 0.0 - 1.0 standard drinks  . Drug use: No  . Sexual activity: Yes    Birth control/protection: None  Lifestyle  . Physical activity    Days per week: Not on file    Minutes per session: Not on file  . Stress: Not on file  Relationships  . Social Herbalist on phone: Not on file    Gets together: Not on file    Attends religious service: Not on file    Active member of club or organization: Not on file    Attends meetings of clubs or organizations: Not on file    Relationship status: Not on file  . Intimate partner violence    Fear of current or ex partner: Not on file    Emotionally abused: Not on file    Physically abused: Not on file    Forced sexual activity: Not on file  Other Topics Concern  . Not on file  Social History Narrative   Divorced. Education: [employer]. Exercise: No.    Review of Systems Per HPI.     Objective:   Physical Exam Vitals signs reviewed.  Constitutional:      General: She is not in acute distress.    Appearance: She is well-developed.  HENT:     Head: Normocephalic and atraumatic.   Cardiovascular:     Rate and Rhythm: Normal rate.  Pulmonary:     Effort: Pulmonary effort is normal.  Neurological:     Mental Status: She is alert and oriented to person, place, and time.    Vitals:   03/22/19 1556 03/22/19 1612  BP: (!) 157/80 120/60  Pulse: 91   Resp: 17   Temp: 99.1 F (37.3 C)   TempSrc: Oral   SpO2: 97%   Weight: 178 lb 3.2 oz (80.8 kg)   Height: 5\' 4"  (1.626 m)        Assessment & Plan:   Tonilynn Bieker is a 68 y.o. female Cellulitis of external nose  -Due to MRSA infection.  Has been able to self  express exudate, and improving.  Right-sided face pain has resolved.  Prior lab work reassuring.  Afebrile.  Tolerating current antibiotics.  -Continue same regimen, encouraged warm compresses, gentle pressure to express exudate, cleanse with soap and water, and recheck in 4 days.  Sooner or to ER/urgent care if any acute worsening. No orders of the defined types were placed in this encounter.  Patient Instructions   Nasal infection is much better today.  Warm compresses 3-4 times per day with gentle washing of the area with soap and water, gentle pressure to express pus if possible.  Continue antibiotic and antiviral at same doses for now.  Tylenol if needed for pain.  Follow-up in 4 days unless any worsening during that time.  Return to the clinic or go to the nearest emergency room if any of your symptoms worsen or new symptoms occur.  Cellulitis, Adult  Cellulitis is a skin infection. The infected area is usually warm, red, swollen, and tender. This condition occurs most often in the arms and lower legs. The infection can travel to the muscles, blood, and underlying tissue and become serious. It is very important to get treated for this condition. What are the causes? Cellulitis is caused by bacteria. The bacteria enter through a break in the skin, such as a cut, burn, insect bite, open sore, or crack. What increases the risk? This condition is more likely to occur in people who:  Have a weak body defense system (immune system).  Have open wounds on the skin, such as cuts, burns, bites, and scrapes. Bacteria can enter the body through these open wounds.  Are older than 68 years of age.  Have diabetes.  Have a type of long-lasting (chronic) liver disease (cirrhosis) or kidney disease.  Are obese.  Have a skin condition such as: ? Itchy rash (eczema). ? Slow movement of blood in the veins (venous stasis). ? Fluid buildup below the skin (edema).  Have had radiation therapy.  Use  IV drugs. What are the signs or symptoms? Symptoms of this condition include:  Redness, streaking, or spotting on the skin.  Swollen area of the skin.  Tenderness or pain when an area of the skin is touched.  Warm skin.  A fever.  Chills.  Blisters. How is this diagnosed? This condition is diagnosed based on a medical history and physical exam. You may also have tests, including:  Blood tests.  Imaging tests. How is this treated? Treatment for this condition may include:  Medicines, such as antibiotic medicines or medicines to treat allergies (antihistamines).  Supportive care, such as rest and application of cold or warm cloths (compresses) to the skin.  Hospital care, if the condition  is severe. The infection usually starts to get better within 1-2 days of treatment. Follow these instructions at home:  Medicines  Take over-the-counter and prescription medicines only as told by your health care provider.  If you were prescribed an antibiotic medicine, take it as told by your health care provider. Do not stop taking the antibiotic even if you start to feel better. General instructions  Drink enough fluid to keep your urine pale yellow.  Do not touch or rub the infected area.  Raise (elevate) the infected area above the level of your heart while you are sitting or lying down.  Apply warm or cold compresses to the affected area as told by your health care provider.  Keep all follow-up visits as told by your health care provider. This is important. These visits let your health care provider make sure a more serious infection is not developing. Contact a health care provider if:  You have a fever.  Your symptoms do not begin to improve within 1-2 days of starting treatment.  Your bone or joint underneath the infected area becomes painful after the skin has healed.  Your infection returns in the same area or another area.  You notice a swollen bump in the  infected area.  You develop new symptoms.  You have a general ill feeling (malaise) with muscle aches and pains. Get help right away if:  Your symptoms get worse.  You feel very sleepy.  You develop vomiting or diarrhea that persists.  You notice red streaks coming from the infected area.  Your red area gets larger or turns dark in color. These symptoms may represent a serious problem that is an emergency. Do not wait to see if the symptoms will go away. Get medical help right away. Call your local emergency services (911 in the [place] Do not drive yourself to the hospital. Summary  Cellulitis is a skin infection. This condition occurs most often in the arms and lower legs.  Treatment for this condition may include medicines, such as antibiotic medicines or antihistamines.  Take over-the-counter and prescription medicines only as told by your health care provider. If you were prescribed an antibiotic medicine, do not stop taking the antibiotic even if you start to feel better.  Contact a health care provider if your symptoms do not begin to improve within 1-2 days of starting treatment or your symptoms get worse.  Keep all follow-up visits as told by your health care provider. This is important. These visits let your health care provider make sure that a more serious infection is not developing. This information is not intended to replace advice given to you by your health care provider. Make sure you discuss any questions you have with your health care provider. Document Released: 06/01/2005 Document Revised: 01/11/2018 Document Reviewed: 01/11/2018 Elsevier Patient Education  [employer].   If you have lab work done today you will be contacted with your lab results within the next 2 weeks.  If you have not heard from [place] then please contact us. The fastest way to get your results is to register for My Chart.   IF you received an x-ray today, you will receive an invoice  from [facility]. Please contact [facility] at [phone] with questions or concerns regarding your invoice.   IF you received labwork today, you will receive an invoice from [place] [facility]. Please contact [facility] at [phone] with questions or concerns regarding your invoice.   Our billing staff will not be  able to assist you with questions regarding bills from these companies.  You will be contacted with the lab results as soon as they are available. The fastest way to get your results is to activate your My Chart account. Instructions are located on the last page of this paperwork. If you have not heard from us regarding the results in 2 weeks, please contact this office.       

## 2019-03-22 NOTE — Patient Instructions (Addendum)
Nasal infection is much better today.  Warm compresses 3-4 times per day with gentle washing of the area with soap and water, gentle pressure to express pus if possible.  Continue antibiotic and antiviral at same doses for now.  Tylenol if needed for pain.  Follow-up in 4 days unless any worsening during that time.  Return to the clinic or go to the nearest emergency room if any of your symptoms worsen or new symptoms occur.  Cellulitis, Adult  Cellulitis is a skin infection. The infected area is usually warm, red, swollen, and tender. This condition occurs most often in the arms and lower legs. The infection can travel to the muscles, blood, and underlying tissue and become serious. It is very important to get treated for this condition. What are the causes? Cellulitis is caused by bacteria. The bacteria enter through a break in the skin, such as a cut, burn, insect bite, open sore, or crack. What increases the risk? This condition is more likely to occur in people who:  Have a weak body defense system (immune system).  Have open wounds on the skin, such as cuts, burns, bites, and scrapes. Bacteria can enter the body through these open wounds.  Are older than 68 years of age.  Have diabetes.  Have a type of long-lasting (chronic) liver disease (cirrhosis) or kidney disease.  Are obese.  Have a skin condition such as: ? Itchy rash (eczema). ? Slow movement of blood in the veins (venous stasis). ? Fluid buildup below the skin (edema).  Have had radiation therapy.  Use IV drugs. What are the signs or symptoms? Symptoms of this condition include:  Redness, streaking, or spotting on the skin.  Swollen area of the skin.  Tenderness or pain when an area of the skin is touched.  Warm skin.  A fever.  Chills.  Blisters. How is this diagnosed? This condition is diagnosed based on a medical history and physical exam. You may also have tests, including:  Blood  tests.  Imaging tests. How is this treated? Treatment for this condition may include:  Medicines, such as antibiotic medicines or medicines to treat allergies (antihistamines).  Supportive care, such as rest and application of cold or warm cloths (compresses) to the skin.  Hospital care, if the condition is severe. The infection usually starts to get better within 1-2 days of treatment. Follow these instructions at home:  Medicines  Take over-the-counter and prescription medicines only as told by your health care provider.  If you were prescribed an antibiotic medicine, take it as told by your health care provider. Do not stop taking the antibiotic even if you start to feel better. General instructions  Drink enough fluid to keep your urine pale yellow.  Do not touch or rub the infected area.  Raise (elevate) the infected area above the level of your heart while you are sitting or lying down.  Apply warm or cold compresses to the affected area as told by your health care provider.  Keep all follow-up visits as told by your health care provider. This is important. These visits let your health care provider make sure a more serious infection is not developing. Contact a health care provider if:  You have a fever.  Your symptoms do not begin to improve within 1-2 days of starting treatment.  Your bone or joint underneath the infected area becomes painful after the skin has healed.  Your infection returns in the same area or another area.  You  notice a swollen bump in the infected area.  You develop new symptoms.  You have a general ill feeling (malaise) with muscle aches and pains. Get help right away if:  Your symptoms get worse.  You feel very sleepy.  You develop vomiting or diarrhea that persists.  You notice red streaks coming from the infected area.  Your red area gets larger or turns dark in color. These symptoms may represent a serious problem that is an  emergency. Do not wait to see if the symptoms will go away. Get medical help right away. Call your local emergency services (911 in the [place] Do not drive yourself to the hospital. Summary  Cellulitis is a skin infection. This condition occurs most often in the arms and lower legs.  Treatment for this condition may include medicines, such as antibiotic medicines or antihistamines.  Take over-the-counter and prescription medicines only as told by your health care provider. If you were prescribed an antibiotic medicine, do not stop taking the antibiotic even if you start to feel better.  Contact a health care provider if your symptoms do not begin to improve within 1-2 days of starting treatment or your symptoms get worse.  Keep all follow-up visits as told by your health care provider. This is important. These visits let your health care provider make sure that a more serious infection is not developing. This information is not intended to replace advice given to you by your health care provider. Make sure you discuss any questions you have with your health care provider. Document Released: 06/01/2005 Document Revised: 01/11/2018 Document Reviewed: 01/11/2018 Elsevier Patient Education  [employer].   If you have lab work done today you will be contacted with your lab results within the next 2 weeks.  If you have not heard from [place] then please contact us. The fastest way to get your results is to register for My Chart.   IF you received an x-ray today, you will receive an invoice from [facility]. Please contact [facility] at [phone] with questions or concerns regarding your invoice.   IF you received labwork today, you will receive an invoice from [place]. Please contact [facility] at [phone] with questions or concerns regarding your invoice.   Our billing staff will not be able to assist you with questions regarding bills from these companies.  You will  be contacted with the lab results as soon as they are available. The fastest way to get your results is to activate your My Chart account. Instructions are located on the last page of this paperwork. If you have not heard from [place] regarding the results in 2 weeks, please contact this office.

## 2019-03-23 ENCOUNTER — Encounter: Payer: Self-pay | Admitting: Family Medicine

## 2019-03-26 ENCOUNTER — Encounter: Payer: Self-pay | Admitting: Family Medicine

## 2019-03-26 ENCOUNTER — Other Ambulatory Visit: Payer: Self-pay

## 2019-03-26 ENCOUNTER — Ambulatory Visit (INDEPENDENT_AMBULATORY_CARE_PROVIDER_SITE_OTHER): Payer: PPO | Admitting: Family Medicine

## 2019-03-26 VITALS — BP 117/68 | HR 92 | Temp 98.7°F | Resp 17 | Ht 64.0 in | Wt 179.6 lb

## 2019-03-26 DIAGNOSIS — B001 Herpesviral vesicular dermatitis: Secondary | ICD-10-CM | POA: Diagnosis not present

## 2019-03-26 DIAGNOSIS — J340 Abscess, furuncle and carbuncle of nose: Secondary | ICD-10-CM

## 2019-03-26 MED ORDER — VALACYCLOVIR HCL 1 G PO TABS: 2000.0000 mg | ORAL_TABLET | Freq: Once | ORAL | 3 refills | Status: AC

## 2019-03-26 NOTE — Patient Instructions (Addendum)
Glad to hear the nasal cellulitis is improved.  Continue antibiotics until completion.    If you do have a recurrence of cold sore virus or blisters/bumps around the nose that may also be due to cold sore virus, take 2 of the Valtrex immediately followed by a repeat dose once in 12 hours.  Schedule visit to establish care with me in the next month or 2 and we can review your other medications and check necessary lab work.  Let me know if there are questions in the meantime.  Return to the clinic or go to the nearest emergency room if any of your symptoms worsen or new symptoms occur.   If you have lab work done today you will be contacted with your lab results within the next 2 weeks.  If you have not heard from [place] then please contact us. The fastest way to get your results is to register for My Chart.   IF you received an x-ray today, you will receive an invoice from [facility]. Please contact [facility] at [phone] with questions or concerns regarding your invoice.   IF you received labwork today, you will receive an invoice from [place]. Please contact [facility] at [phone] with questions or concerns regarding your invoice.   Our billing staff will not be able to assist you with questions regarding bills from these companies.  You will be contacted with the lab results as soon as they are available. The fastest way to get your results is to activate your My Chart account. Instructions are located on the last page of this paperwork. If you have not heard from [place] regarding the results in 2 weeks, please contact this office.

## 2019-03-26 NOTE — Progress Notes (Signed)
Subjective:    Patient ID: Becky Lawson, female    DOB: 1951-05-10, 68 y.o.   MRN: [id number]  HPI Lauralyn Shadowens is a 68 y.o. female Presents today for: Chief Complaint  Patient presents with  . 4 day f/u    all healed up per pt   Nasal cellulitis: See prior visit, initially evaluated July 15, then July 17.  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  Sensitive to doxycycline on culture, improving at last visit with use of doxycycline.   Still on both meds, but everything healing up.  No diarrhea or other side effects with meds.  No  Further pain or sensitivity. No further d/c.  valtre Patient Active Problem List   Diagnosis Date Noted  . Tremor 07/12/2018  . Prediabetes 07/12/2018  . Polypharmacy 07/12/2018  . Unintentional weight loss of more than 10 pounds 07/12/2018  . OSA (obstructive sleep apnea) 06/02/2018  . Fatty liver 04/18/2017  . GERD (gastroesophageal reflux disease) 10/28/2016  . 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 Cough 06/24/2016  . S/P partial thyroidectomy 06/16/2016  . Multinodular goiter 06/02/2016  . Thyroid nodule 02/08/2016  . History of hepatitis C 05/19/2015  . Vitamin D deficiency 05/19/2015  . Spinal stenosis of lumbar region 05/19/2015  . Hyperlipidemia 05/19/2015  . Bipolar disorder ([place]) 05/19/2015  . Essential hypertension 09/26/2013  . Undiagnosed cardiac murmurs 09/26/2013   Past Medical History:  Diagnosis Date  . Anxiety   . Arthritis   . Benign cyst of right kidney 2014   2.7 cm right kidney cyst seen on imaging 2014 but was c/o right flank pain with new persistent proteinuria (fortunately hematuria had resolved) so repeat US 11/2016 showed right kidney still with 2.6 cm simple cyst and otherwise nml.  . Bipolar 1 disorder ([place])   . Chronic kidney disease   . Depression   . GERD (gastroesophageal reflux disease)   . Heart murmur   . Hepatitis C    resolved completely after treatment in 2014, genotype 1b,  follow[facility]  . Hyperlipidemia   . Hypertension   . Jaundice 11/01/2012  . Pruritic disorder 02/13/2013  . Sleep apnea    Was diagnosed approximately 20 years ago, but does not wear CPAP patient stated "I gave it back.Marland KitchenMarland KitchenI couldn't wear that thing it was horrible"  . Spinal stenosis    Past Surgical History:  Procedure Laterality Date  . ABDOMINAL HYSTERECTOMY    . COLONOSCOPY W/ POLYPECTOMY    . JOINT REPLACEMENT Bilateral    knee  . KNEE ARTHROSCOPY Bilateral   . PARTIAL HYSTERECTOMY    . THYROID LOBECTOMY Left 06/16/2016  . THYROID LOBECTOMY Left 06/16/2016   Procedure: LEFT THYROID LOBECTOMY;  Surgeon: Greer Pickerel, MD;  Location: [facility];  Service: General;  Laterality: Left;   Allergies  Allergen Reactions  . No Known Allergies    Prior to Admission medications   Medication Sig Start Date End Date Taking? Authorizing Provider  ALPRAZolam Duanne Moron) 1 MG tablet 1 hs and I prn 01/09/19  Yes Hurst, Teresa T, PA-C  amLODipine (NORVASC) 10 MG tablet Take 1 tablet (10 mg total) by mouth daily. 07/12/18  Yes Shawnee Knapp, MD  buPROPion (WELLBUTRIN XL) 150 MG 24 hr tablet Take 1 tablet (150 mg total) by mouth daily. Take in addition to the 300 mg to equal 450 mg/day. 02/04/19  Yes Hurst, Dorothea Glassman, PA-C  buPROPion (WELLBUTRIN XL) 300 MG 24 hr tablet Take 1 tablet (300 mg total)  by mouth daily. 01/06/19  Yes Cottle, Billey Co., MD  candesartan (ATACAND) 32 MG tablet Take 1 tablet (32 mg total) by mouth daily. 07/12/18  Yes Shawnee Knapp, MD  carbidopa-levodopa (SINEMET IR) 25-100 MG tablet TAKE 1 TABLET BY MOUTH EVERYDAY AT BEDTIME 08/21/18  Yes Shawnee Knapp, MD  Cholecalciferol (VITAMIN D-3) 5000 units TABS Take 5,000 Units daily by mouth. In addition to the rx high dose once weekly vitamin D of 50Ku. 07/24/17  Yes Shawnee Knapp, MD  Coenzyme Q10 (CO Q-10) 300 MG CAPS Take 1 capsule daily by mouth.   Yes [provider]  docusate sodium (COLACE) 100 MG capsule Take 1 capsule (100 mg total) by  mouth 2 (two) times daily. 05/13/17  Yes Shawnee Knapp, MD  doxycycline (VIBRA-TABS) 100 MG tablet Take 1 tablet (100 mg total) by mouth 2 (two) times daily. 03/20/19  Yes Wendie Agreste, MD  DULoxetine (CYMBALTA) 30 MG capsule TAKE 1 CAPSULE BY MOUTH EVERY DAY 03/04/19  Yes Hurst, Teresa T, PA-C  Ginkgo Biloba 40 MG TABS Take 1 tablet daily by mouth.   Yes [provider]  HYDROcodone-acetaminophen (NORCO) 10-325 MG tablet Take 1 tablet by mouth 3 (three) times daily.    Yes [provider]  hydrOXYzine (ATARAX/VISTARIL) 25 MG tablet Take 2 tablets (50 mg total) by mouth every 6 (six) hours as needed for anxiety or itching. 10/03/18  Yes Shugart, [name], PA-C  lamoTRIgine (LAMICTAL) 200 MG tablet TAKE 1 TABLET BY MOUTH TWICE A DAY 02/05/19  Yes Hurst, Teresa T, PA-C  levothyroxine (SYNTHROID, LEVOTHROID) 100 MCG tablet Take 1 tablet (100 mcg total) by mouth daily before breakfast. 07/12/18  Yes Shawnee Knapp, MD  lidocaine (XYLOCAINE) 2 % solution Use as directed 15 mLs in the mouth or throat as needed (topical tid prn to nasal wound.). 03/20/19  Yes Wendie Agreste, MD  magnesium oxide (MAG-OX) 400 MG tablet Take 400 mg by mouth daily.   Yes [provider]  Omega-3 Fatty Acids (FISH OIL) 1200 MG CAPS Take by mouth.   Yes [provider]  omeprazole (PRILOSEC) 40 MG capsule TAKE 1 CAPSULE BY MOUTH EVERY DAY AS NEEDED FOR INDIGESTION OR HEARTBURN 12/27/18  Yes Shawnee Knapp, MD  pravastatin (PRAVACHOL) 40 MG tablet Take 1 tablet (40 mg total) by mouth daily. 07/12/18  Yes Shawnee Knapp, MD  traZODone (DESYREL) 100 MG tablet Take 3 tablets (300 mg total) by mouth daily. 10/03/18  Yes Shugart, [name], PA-C  triamcinolone cream (KENALOG) 0.1 % Apply 1 application topically 4 (four) times daily. 07/12/18  Yes Shawnee Knapp, MD  valACYclovir (VALTREX) 1000 MG tablet Take 1 tablet (1,000 mg total) by mouth 3 (three) times daily. 03/20/19  Yes Wendie Agreste, MD  Vitamin D, Ergocalciferol,  (DRISDOL) 1.25 MG (50000 UT) CAPS capsule TAKE 1 CAPSULE (50,000 UNITS TOTAL) BY MOUTH 2 (TWO) TIMES A WEEK. 10/08/18  Yes Shawnee Knapp, MD  vitamin E 600 UNIT capsule Take 600 Units by mouth daily.   Yes [provider]   Social History   Socioeconomic History  . Marital status: Divorced    Spouse name: Not on file  . Number of children: Not on file  . Years of education: Not on file  . Highest education level: Not on file  Occupational History  . Occupation: Geophysical data processor  Social Needs  . Financial resource strain: Not on file  . Food insecurity  Worry: Not on file    Inability: Not on file  . Transportation needs    Medical: Not on file    Non-medical: Not on file  Tobacco Use  . Smoking status: Former Smoker    Packs/day: 1.00    Years: 15.00    Pack years: 15.00    Quit date: 06/30/1979    Years since quitting: 39.7  . Smokeless tobacco: Never Used  Substance and Sexual Activity  . Alcohol use: Yes    Alcohol/week: 0.0 - 1.0 standard drinks  . Drug use: No  . Sexual activity: Yes    Birth control/protection: None  Lifestyle  . Physical activity    Days per week: Not on file    Minutes per session: Not on file  . Stress: Not on file  Relationships  . Social Herbalist on phone: Not on file    Gets together: Not on file    Attends religious service: Not on file    Active member of club or organization: Not on file    Attends meetings of clubs or organizations: Not on file    Relationship status: Not on file  . Intimate partner violence    Fear of current or ex partner: Not on file    Emotionally abused: Not on file    Physically abused: Not on file    Forced sexual activity: Not on file  Other Topics Concern  . Not on file  Social History Narrative   Divorced. Education: [employer]. Exercise: No.    Review of Systems Per HPI.      Objective:   Physical Exam Vitals signs reviewed.  Constitutional:      General: She is not in  acute distress.    Appearance: She is well-developed.  HENT:     Head: Normocephalic and atraumatic.     Nose: No nasal deformity.   Cardiovascular:     Rate and Rhythm: Normal rate.  Pulmonary:     Effort: Pulmonary effort is normal.  Neurological:     Mental Status: She is alert and oriented to person, place, and time.    Vitals:   03/26/19 1656  BP: 117/68  Pulse: 92  Resp: 17  Temp: 98.7 F (37.1 C)  TempSrc: Oral  SpO2: 95%  Weight: 179 lb 9.6 oz (81.5 kg)  Height: 5\' 4"  (1.626 m)        Assessment & Plan:   Keshona Kartes is a 68 y.o. female Nose cellulitis  - improving. Finish abx and rtc precautions given.  Cold sore - Plan: valACYclovir (VALTREX) 1000 MG tablet  -Valtrex 2 g dose followed by repeat dose in 12 hours if recurrence of cold sores or new blisters in area of nose is likely started with HSV for current cellulitis.  Plans to schedule establish care visit for review of chronic medication.  She is followed by [facility] psychiatry for psychiatric medication, with stable symptoms.  Meds ordered this encounter  Medications  . valACYclovir (VALTREX) 1000 MG tablet    Sig: Take 2 tablets (2,000 mg total) by mouth once for 1 dose. With repeat dose in 12 hours.  Take at first onset of cold sore symptoms.    Dispense:  4 tablet    Refill:  3   Patient Instructions   Glad to hear the nasal cellulitis is improved.  Continue antibiotics until completion.    If you do have a recurrence of cold sore virus or blisters/bumps around the  nose that may also be due to cold sore virus, take 2 of the Valtrex immediately followed by a repeat dose once in 12 hours.  Schedule visit to establish care with me in the next month or 2 and we can review your other medications and check necessary lab work.  Let me know if there are questions in the meantime.  Return to the clinic or go to the nearest emergency room if any of your symptoms worsen or new symptoms occur.   If  you have lab work done today you will be contacted with your lab results within the next 2 weeks.  If you have not heard from [place] then please contact us. The fastest way to get your results is to register for My Chart.   IF you received an x-ray today, you will receive an invoice from [facility]. Please contact [facility] at [phone] with questions or concerns regarding your invoice.   IF you received labwork today, you will receive an invoice from [facility]. Please contact [facility] at [phone] with questions or concerns regarding your invoice.   Our billing staff will not be able to assist you with questions regarding bills from these companies.  You will be contacted with the lab results as soon as they are available. The fastest way to get your results is to activate your My Chart account. Instructions are located on the last page of this paperwork. If you have not heard from [place] regarding the results in 2 weeks, please contact this office.      Signed,   Merri Ray, MD [facility].  03/26/19 5:23 PM

## 2019-03-28 ENCOUNTER — Other Ambulatory Visit: Payer: Self-pay

## 2019-03-28 MED ORDER — OMEPRAZOLE 40 MG PO CPDR: DELAYED_RELEASE_CAPSULE | ORAL | 0 refills | Status: DC

## 2019-04-02 ENCOUNTER — Other Ambulatory Visit: Payer: Self-pay

## 2019-04-02 MED ORDER — TRAZODONE HCL 100 MG PO TABS: 300.0000 mg | ORAL_TABLET | Freq: Every day | ORAL | 0 refills | Status: DC

## 2019-04-03 ENCOUNTER — Other Ambulatory Visit: Payer: Self-pay | Admitting: Psychiatry

## 2019-04-08 ENCOUNTER — Ambulatory Visit (INDEPENDENT_AMBULATORY_CARE_PROVIDER_SITE_OTHER): Payer: PPO | Admitting: Family Medicine

## 2019-04-08 ENCOUNTER — Encounter: Payer: Self-pay | Admitting: Family Medicine

## 2019-04-08 ENCOUNTER — Other Ambulatory Visit: Payer: Self-pay

## 2019-04-08 VITALS — BP 113/64 | HR 85 | Temp 98.4°F | Resp 12 | Wt 176.6 lb

## 2019-04-08 DIAGNOSIS — R251 Tremor, unspecified: Secondary | ICD-10-CM

## 2019-04-08 DIAGNOSIS — R5383 Other fatigue: Secondary | ICD-10-CM | POA: Diagnosis not present

## 2019-04-08 DIAGNOSIS — E785 Hyperlipidemia, unspecified: Secondary | ICD-10-CM

## 2019-04-08 DIAGNOSIS — E890 Postprocedural hypothyroidism: Secondary | ICD-10-CM

## 2019-04-08 DIAGNOSIS — K219 Gastro-esophageal reflux disease without esophagitis: Secondary | ICD-10-CM | POA: Diagnosis not present

## 2019-04-08 DIAGNOSIS — I10 Essential (primary) hypertension: Secondary | ICD-10-CM | POA: Diagnosis not present

## 2019-04-08 DIAGNOSIS — F319 Bipolar disorder, unspecified: Secondary | ICD-10-CM | POA: Diagnosis not present

## 2019-04-08 DIAGNOSIS — M48061 Spinal stenosis, lumbar region without neurogenic claudication: Secondary | ICD-10-CM | POA: Diagnosis not present

## 2019-04-08 MED ORDER — AMLODIPINE BESYLATE 5 MG PO TABS: 5.0000 mg | ORAL_TABLET | Freq: Every day | ORAL | 1 refills | Status: DC

## 2019-04-08 NOTE — Patient Instructions (Addendum)
I would recommend discussing whether or not to continue sinemet with Dr. Rexene Alberts as it appears she said to not continue it  - especially if it does not change tremors. She can also determine if other meds needed.   I will refer you to gastroenterology to discuss reflux and hoarseness. If no source found, can follow up with ENT. See foods to avoid and recommendations below.    Food Choices for Gastroesophageal Reflux Disease, Adult When you have gastroesophageal reflux disease (GERD), the foods you eat and your eating habits are very important. Choosing the right foods can help ease your discomfort. Think about working with a nutrition specialist (dietitian) to help you make good choices. What are tips for following this plan?  Meals  Choose healthy foods that are low in fat, such as fruits, vegetables, whole grains, low-fat dairy products, and lean meat, fish, and poultry.  Eat small meals often instead of 3 large meals a day. Eat your meals slowly, and in a place where you are relaxed. Avoid bending over or lying down until 2-3 hours after eating.  Avoid eating meals 2-3 hours before bed.  Avoid drinking a lot of liquid with meals.  Cook foods using methods other than frying. Bake, grill, or broil food instead.  Avoid or limit: ? Chocolate. ? Peppermint or spearmint. ? Alcohol. ? Pepper. ? Black and decaffeinated coffee. ? Black and decaffeinated tea. ? Bubbly (carbonated) soft drinks. ? Caffeinated energy drinks and soft drinks.  Limit high-fat foods such as: ? Fatty meat or fried foods. ? Whole milk, cream, butter, or ice cream. ? Nuts and nut butters. ? Pastries, donuts, and sweets made with butter or shortening.  Avoid foods that cause symptoms. These foods may be different for everyone. Common foods that cause symptoms include: ? Tomatoes. ? Oranges, lemons, and limes. ? Peppers. ? Spicy food. ? Onions and garlic. ? Vinegar. Lifestyle  Maintain a healthy weight.  Ask your doctor what weight is healthy for you. If you need to lose weight, work with your doctor to do so safely.  Exercise for at least 30 minutes for 5 or more days each week, or as told by your doctor.  Wear loose-fitting clothes.  Do not smoke. If you need help quitting, ask your doctor.  Sleep with the head of your bed higher than your feet. Use a wedge under the mattress or blocks under the bed frame to raise the head of the bed. Summary  When you have gastroesophageal reflux disease (GERD), food and lifestyle choices are very important in easing your symptoms.  Eat small meals often instead of 3 large meals a day. Eat your meals slowly, and in a place where you are relaxed.  Limit high-fat foods such as fatty meat or fried foods.  Avoid bending over or lying down until 2-3 hours after eating.  Avoid peppermint and spearmint, caffeine, alcohol, and chocolate. This information is not intended to replace advice given to you by your health care provider. Make sure you discuss any questions you have with your health care provider. Document Released: 02/21/2012 Document Revised: 12/13/2018 Document Reviewed: 09/27/2016 Elsevier Patient Education  [employer].   If you have lab work done today you will be contacted with your lab results within the next 2 weeks.  If you have not heard from [place] then please contact us. The fastest way to get your results is to register for My Chart.   IF you received an x-ray today, you  will receive an invoice from [facility]. Please contact [facility] at [phone] with questions or concerns regarding your invoice.   IF you received labwork today, you will receive an invoice from [facility]. Please contact [facility] at [phone] with questions or concerns regarding your invoice.   Our billing staff will not be able to assist you with questions regarding bills from these companies.  You will be contacted with the lab  results as soon as they are available. The fastest way to get your results is to activate your My Chart account. Instructions are located on the last page of this paperwork. If you have not heard from [place] regarding the results in 2 weeks, please contact this office.

## 2019-04-08 NOTE — Progress Notes (Signed)
Subjective:    Patient ID: Becky Lawson, female    DOB: 1951-08-05, 68 y.o.   MRN: [id number]  HPI Becky Lawson is a 68 y.o. female Presents today for: Chief Complaint  Patient presents with  . Establish Care    Patient is here today to est care> patient stated she is not having any problems or issus at this time   Previous patient of Dr. Brigitte Pulse. Establishing with me for primary care. Recently treated her for nasal cellulitis.   Bipolar disorder, followed by psychiatry.  Takes Wellbutrin 450mg ,  Xanax 1mg  at night, Cymbalta 30mg  QD,  Lamictal 200mg  BID, hydroxyzine for anxiety, trazodone at bedtime. [facility],   Hypothyroidism: Lab Results  Component Value Date   TSH 0.824 07/12/2018  synthroid 169mcg QD,  Had partial thyroidectomy for thyroid tumor.  Spinal stenosis, chronic back pain Dr. [name] Hydrocodone as needed - 10/325mg  - most days 2 per day. Aware of accidental overdose risk with xanax - tries to avoid combo.   History of tremors/essential tremor Seen prior by Dr. Rexene Alberts. Last OV reviewed 07/19/18.  Claustrophobic unable to have DaT scan. No change in tremor with Sinemet. Appears that could stop sinemet per Dr. Guadelupe Sabin note, but pt plans to clarify.   GERD: prilosec daily. 40mg  per day helps. Occasional extra one, but chronic throat clearing. Extra dose past week or so. No recent endoscopy or GI eval, prior saw Dr. Cristina Gong. Some hoarseness past few months.   Ferritin, B12 and Vit D ok last November. On rx vitamin D.   Hypertension: BP Readings from Last 3 Encounters:  04/08/19 113/64  03/26/19 117/68  03/22/19 120/60   Lab Results  Component Value Date   CREATININE 0.87 12/05/2018  candesartan 32 mg qd. norvasc 10mg  QD - fatigue at times past year or two.  No home BP's.   Hyperlipidemia:  Lab Results  Component Value Date   CHOL 178 07/12/2018   HDL 51 07/12/2018   LDLCALC 108 (H) 07/12/2018   TRIG 96 07/12/2018   CHOLHDL 3.5 07/12/2018   Lab  Results  Component Value Date   ALT 10 12/05/2018   AST 14 12/05/2018   ALKPHOS 62 12/05/2018   BILITOT 0.3 12/05/2018  pravastatin 40mg  qd.  Fasting today.  Some fatigue as above., no new myalgias.    Patient Active Problem List   Diagnosis Date Noted  . Tremor 07/12/2018  . Prediabetes 07/12/2018  . Polypharmacy 07/12/2018  . Unintentional weight loss of more than 10 pounds 07/12/2018  . OSA (obstructive sleep apnea) 06/02/2018  . Fatty liver 04/18/2017  . GERD (gastroesophageal reflux disease) 10/28/2016  . 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 Cough 06/24/2016  . S/P partial thyroidectomy 06/16/2016  . Multinodular goiter 06/02/2016  . Thyroid nodule 02/08/2016  . History of hepatitis C 05/19/2015  . Vitamin D deficiency 05/19/2015  . Spinal stenosis of lumbar region 05/19/2015  . Hyperlipidemia 05/19/2015  . Bipolar disorder ([place]) 05/19/2015  . Essential hypertension 09/26/2013  . Undiagnosed cardiac murmurs 09/26/2013   Past Medical History:  Diagnosis Date  . Anxiety   . Arthritis   . Benign cyst of right kidney 2014   2.7 cm right kidney cyst seen on imaging 2014 but was c/o right flank pain with new persistent proteinuria (fortunately hematuria had resolved) so repeat US 11/2016 showed right kidney still with 2.6 cm simple cyst and otherwise nml.  . Bipolar 1 disorder ([place])   . Chronic kidney disease   . Depression   .  GERD (gastroesophageal reflux disease)   . Heart murmur   . Hepatitis C    resolved completely after treatment in 2014, genotype 1b, followed [facility]  . Hyperlipidemia   . Hypertension   . Jaundice 11/01/2012  . Pruritic disorder 02/13/2013  . Sleep apnea    Was diagnosed approximately 20 years ago, but does not wear CPAP patient stated "I gave it back.Marland KitchenMarland KitchenI couldn't wear that thing it was horrible"  . Spinal stenosis    Past Surgical History:  Procedure Laterality Date  . ABDOMINAL HYSTERECTOMY    . COLONOSCOPY W/ POLYPECTOMY    .  JOINT REPLACEMENT Bilateral    knee  . KNEE ARTHROSCOPY Bilateral   . PARTIAL HYSTERECTOMY    . THYROID LOBECTOMY Left 06/16/2016  . THYROID LOBECTOMY Left 06/16/2016   Procedure: LEFT THYROID LOBECTOMY;  Surgeon: Greer Pickerel, MD;  Location: [place];  Service: General;  Laterality: Left;   Allergies  Allergen Reactions  . No Known Allergies    Prior to Admission medications   Medication Sig Start Date End Date Taking? Authorizing Provider  ALPRAZolam Duanne Moron) 1 MG tablet 1 hs and I prn 01/09/19  Yes Hurst, Teresa T, PA-C  amLODipine (NORVASC) 10 MG tablet Take 1 tablet (10 mg total) by mouth daily. 07/12/18  Yes Shawnee Knapp, MD  buPROPion (WELLBUTRIN XL) 150 MG 24 hr tablet Take 1 tablet (150 mg total) by mouth daily. Take in addition to the 300 mg to equal 450 mg/day. 02/04/19  Yes Hurst, Dorothea Glassman, PA-C  buPROPion (WELLBUTRIN XL) 300 MG 24 hr tablet TAKE 1 TABLET BY MOUTH EVERY DAY 04/04/19  Yes Hurst, Teresa T, PA-C  candesartan (ATACAND) 32 MG tablet Take 1 tablet (32 mg total) by mouth daily. 07/12/18  Yes Shawnee Knapp, MD  carbidopa-levodopa (SINEMET IR) 25-100 MG tablet TAKE 1 TABLET BY MOUTH EVERYDAY AT BEDTIME 08/21/18  Yes Shawnee Knapp, MD  Cholecalciferol (VITAMIN D-3) 5000 units TABS Take 5,000 Units daily by mouth. In addition to the rx high dose once weekly vitamin D of 50Ku. 07/24/17  Yes Shawnee Knapp, MD  Coenzyme Q10 (CO Q-10) 300 MG CAPS Take 1 capsule daily by mouth.   Yes [provider]  docusate sodium (COLACE) 100 MG capsule Take 1 capsule (100 mg total) by mouth 2 (two) times daily. 05/13/17  Yes Shawnee Knapp, MD  doxycycline (VIBRA-TABS) 100 MG tablet Take 1 tablet (100 mg total) by mouth 2 (two) times daily. 03/20/19  Yes Wendie Agreste, MD  DULoxetine (CYMBALTA) 30 MG capsule TAKE 1 CAPSULE BY MOUTH EVERY DAY 03/04/19  Yes Hurst, Teresa T, PA-C  Ginkgo Biloba 40 MG TABS Take 1 tablet daily by mouth.   Yes [provider]  HYDROcodone-acetaminophen (NORCO) 10-325  MG tablet Take 1 tablet by mouth 3 (three) times daily.    Yes [provider]  hydrOXYzine (ATARAX/VISTARIL) 25 MG tablet Take 2 tablets (50 mg total) by mouth every 6 (six) hours as needed for anxiety or itching. 10/03/18  Yes Shugart, [name], PA-C  lamoTRIgine (LAMICTAL) 200 MG tablet TAKE 1 TABLET BY MOUTH TWICE A DAY 02/05/19  Yes Hurst, Teresa T, PA-C  levothyroxine (SYNTHROID, LEVOTHROID) 100 MCG tablet Take 1 tablet (100 mcg total) by mouth daily before breakfast. 07/12/18  Yes Shawnee Knapp, MD  lidocaine (XYLOCAINE) 2 % solution Use as directed 15 mLs in the mouth or throat as needed (topical tid prn to nasal wound.). 03/20/19  Yes Merri Ray  R, MD  magnesium oxide (MAG-OX) 400 MG tablet Take 400 mg by mouth daily.   Yes [provider]  Omega-3 Fatty Acids (FISH OIL) 1200 MG CAPS Take by mouth.   Yes [provider]  omeprazole (PRILOSEC) 40 MG capsule TAKE 1 CAPSULE BY MOUTH EVERY DAY AS NEEDED FOR INDIGESTION OR HEARTBURN. 03/28/19  Yes Wendie Agreste, MD  pravastatin (PRAVACHOL) 40 MG tablet Take 1 tablet (40 mg total) by mouth daily. 07/12/18  Yes Shawnee Knapp, MD  traZODone (DESYREL) 100 MG tablet Take 3 tablets (300 mg total) by mouth daily. 04/02/19  Yes Donnal Moat T, PA-C  triamcinolone cream (KENALOG) 0.1 % Apply 1 application topically 4 (four) times daily. 07/12/18  Yes Shawnee Knapp, MD  Vitamin D, Ergocalciferol, (DRISDOL) 1.25 MG (50000 UT) CAPS capsule TAKE 1 CAPSULE (50,000 UNITS TOTAL) BY MOUTH 2 (TWO) TIMES A WEEK. 10/08/18  Yes Shawnee Knapp, MD  vitamin E 600 UNIT capsule Take 600 Units by mouth daily.   Yes [provider]   Social History   Socioeconomic History  . Marital status: Divorced    Spouse name: Not on file  . Number of children: Not on file  . Years of education: Not on file  . Highest education level: Not on file  Occupational History  . Occupation: Geophysical data processor  Social Needs  . Financial resource strain: Not on  file  . Food insecurity    Worry: Not on file    Inability: Not on file  . Transportation needs    Medical: Not on file    Non-medical: Not on file  Tobacco Use  . Smoking status: Former Smoker    Packs/day: 1.00    Years: 15.00    Pack years: 15.00    Quit date: 06/30/1979    Years since quitting: 39.8  . Smokeless tobacco: Never Used  Substance and Sexual Activity  . Alcohol use: Yes    Alcohol/week: 0.0 - 1.0 standard drinks  . Drug use: No  . Sexual activity: Yes    Birth control/protection: None  Lifestyle  . Physical activity    Days per week: Not on file    Minutes per session: Not on file  . Stress: Not on file  Relationships  . Social Herbalist on phone: Not on file    Gets together: Not on file    Attends religious service: Not on file    Active member of club or organization: Not on file    Attends meetings of clubs or organizations: Not on file    Relationship status: Not on file  . Intimate partner violence    Fear of current or ex partner: Not on file    Emotionally abused: Not on file    Physically abused: Not on file    Forced sexual activity: Not on file  Other Topics Concern  . Not on file  Social History Narrative   Divorced. Education: [employer]. Exercise: No.    Review of Systems Per HPI.     Objective:   Physical Exam Vitals signs reviewed.  Constitutional:      Appearance: She is well-developed.  HENT:     Head: Normocephalic and atraumatic.  Eyes:     Conjunctiva/sclera: Conjunctivae normal.     Pupils: Pupils are equal, round, and reactive to light.  Neck:     Vascular: No carotid bruit.  Cardiovascular:     Rate and Rhythm: Normal rate and  regular rhythm.     Heart sounds: Normal heart sounds.  Pulmonary:     Effort: Pulmonary effort is normal.     Breath sounds: Normal breath sounds.  Abdominal:     Palpations: Abdomen is soft. There is no pulsatile mass.     Tenderness: There is no abdominal tenderness.  Skin:     General: Skin is warm and dry.  Neurological:     Mental Status: She is alert and oriented to person, place, and time.  Psychiatric:        Behavior: Behavior normal.    Vitals:   04/08/19 1528  BP: 113/64  Pulse: 85  Resp: 12  Temp: 98.4 F (36.9 C)  TempSrc: Oral  SpO2: 100%  Weight: 176 lb 9.6 oz (80.1 kg)       Assessment & Plan:    Ananiah Maciolek is a 68 y.o. female Essential hypertension - Plan: amLODipine (NORVASC) 5 MG tablet,   -Stable on current regimen, no changes.  Labs pending  Gastroesophageal reflux disease, esophagitis presence not specified - Plan: Ambulatory referral to Gastroenterology,   -Due to increased symptoms, and occasional need for double dosing of PPI, will refer to gastroenterology to evaluate for med changes or need for endoscopy.  Hyperlipidemia, unspecified hyperlipidemia type - Plan: Comprehensive metabolic panel, Lipid panel,   -Check baseline labs, appears to be tolerating statin overall at this time, chronic fatigue unlikely cause.  Consider trial off temporarily in the future if persistent fatigue.  Fatigue- unspecified type - Plan:   -Longstanding symptoms, may be multifactorial including with her polypharmacy, but will check TSH, CMP.  Recent CBC was reassuring.  Postoperative hypothyroidism - Plan: TSH, continue same dose Synthroid for now  Tremor - Plan:  -Still taking Sinemet, but based on neuro last note, it appears she may not need to remain on that medicine if not effective.  Advised to discuss with neuro  Bipolar affective disorder, remission status unspecified ([place]) - Plan:   -Managed by psychiatry including medication refills.  Reports stability of symptoms.  Spinal stenosis of lumbar region, unspecified whether neurogenic claudication present - Plan:    -Chronic back pain managed by specialist, and narcotic pain medications written by that provider.  Cautioned on risks of these medicines as well as other current medications,  and recommended against use with benzodiazepine.  She is aware of the potential increased risk for accidental overdose.   Meds ordered this encounter  Medications  . amLODipine (NORVASC) 5 MG tablet    Sig: Take 1 tablet (5 mg total) by mouth daily.    Dispense:  90 tablet    Refill:  1   Patient Instructions    I would recommend discussing whether or not to continue sinemet with Dr. Rexene Alberts as it appears she said to not continue it  - especially if it does not change tremors. She can also determine if other meds needed.   I will refer you to gastroenterology to discuss reflux and hoarseness. If no source found, can follow up with ENT. See foods to avoid and recommendations below.    Food Choices for Gastroesophageal Reflux Disease, Adult When you have gastroesophageal reflux disease (GERD), the foods you eat and your eating habits are very important. Choosing the right foods can help ease your discomfort. Think about working with a nutrition specialist (dietitian) to help you make good choices. What are tips for following this plan?  Meals  Choose healthy foods that are low in fat, such  as fruits, vegetables, whole grains, low-fat dairy products, and lean meat, fish, and poultry.  Eat small meals often instead of 3 large meals a day. Eat your meals slowly, and in a place where you are relaxed. Avoid bending over or lying down until 2-3 hours after eating.  Avoid eating meals 2-3 hours before bed.  Avoid drinking a lot of liquid with meals.  Cook foods using methods other than frying. Bake, grill, or broil food instead.  Avoid or limit: ? Chocolate. ? Peppermint or spearmint. ? Alcohol. ? Pepper. ? Black and decaffeinated coffee. ? Black and decaffeinated tea. ? Bubbly (carbonated) soft drinks. ? Caffeinated energy drinks and soft drinks.  Limit high-fat foods such as: ? Fatty meat or fried foods. ? Whole milk, cream, butter, or ice cream. ? Nuts and nut butters. ?  Pastries, donuts, and sweets made with butter or shortening.  Avoid foods that cause symptoms. These foods may be different for everyone. Common foods that cause symptoms include: ? Tomatoes. ? Oranges, lemons, and limes. ? Peppers. ? Spicy food. ? Onions and garlic. ? Vinegar. Lifestyle  Maintain a healthy weight. Ask your doctor what weight is healthy for you. If you need to lose weight, work with your doctor to do so safely.  Exercise for at least 30 minutes for 5 or more days each week, or as told by your doctor.  Wear loose-fitting clothes.  Do not smoke. If you need help quitting, ask your doctor.  Sleep with the head of your bed higher than your feet. Use a wedge under the mattress or blocks under the bed frame to raise the head of the bed. Summary  When you have gastroesophageal reflux disease (GERD), food and lifestyle choices are very important in easing your symptoms.  Eat small meals often instead of 3 large meals a day. Eat your meals slowly, and in a place where you are relaxed.  Limit high-fat foods such as fatty meat or fried foods.  Avoid bending over or lying down until 2-3 hours after eating.  Avoid peppermint and spearmint, caffeine, alcohol, and chocolate. This information is not intended to replace advice given to you by your health care provider. Make sure you discuss any questions you have with your health care provider. Document Released: 02/21/2012 Document Revised: 12/13/2018 Document Reviewed: 09/27/2016 Elsevier Patient Education  [employer].   If you have lab work done today you will be contacted with your lab results within the next 2 weeks.  If you have not heard from [place] then please contact us. The fastest way to get your results is to register for My Chart.   IF you received an x-ray today, you will receive an invoice from [facility]. Please contact [facility] at [phone] with questions or concerns regarding your  invoice.   IF you received labwork today, you will receive an invoice from [facility]. Please contact [facility] at [phone] with questions or concerns regarding your invoice.   Our billing staff will not be able to assist you with questions regarding bills from these companies.  You will be contacted with the lab results as soon as they are available. The fastest way to get your results is to activate your My Chart account. Instructions are located on the last page of this paperwork. If you have not heard from [place] regarding the results in 2 weeks, please contact this office.       Signed,   Merri Ray, MD [facility].  04/09/19 3:38 PM

## 2019-04-09 ENCOUNTER — Encounter: Payer: Self-pay | Admitting: Family Medicine

## 2019-04-09 LAB — COMPREHENSIVE METABOLIC PANEL
ALT: 8 IU/L (ref 0–32)
AST: 18 IU/L (ref 0–40)
Albumin/Globulin Ratio: 3.5 — ABNORMAL HIGH (ref 1.2–2.2)
Albumin: 4.6 g/dL (ref 3.8–4.8)
Alkaline Phosphatase: 59 IU/L (ref 39–117)
BUN/Creatinine Ratio: 16 (ref 12–28)
BUN: 15 mg/dL (ref 8–27)
Bilirubin Total: 0.3 mg/dL (ref 0.0–1.2)
CO2: 24 mmol/L (ref 20–29)
Calcium: 9.5 mg/dL (ref 8.7–10.3)
Chloride: 104 mmol/L (ref 96–106)
Creatinine, Ser: 0.96 mg/dL (ref 0.57–1.00)
GFR calc Af Amer: 70 mL/min/{1.73_m2} (ref >59)
GFR calc non Af Amer: 61 mL/min/{1.73_m2} (ref >59)
Globulin, Total: 1.3 g/dL — ABNORMAL LOW (ref 1.5–4.5)
Glucose: 89 mg/dL (ref 65–99)
Potassium: 4.4 mmol/L (ref 3.5–5.2)
Sodium: 142 mmol/L (ref 134–144)
Total Protein: 5.9 g/dL — ABNORMAL LOW (ref 6.0–8.5)

## 2019-04-09 LAB — LIPID PANEL
Chol/HDL Ratio: 3.4 ratio (ref 0.0–4.4)
Cholesterol, Total: 205 mg/dL — ABNORMAL HIGH (ref 100–199)
HDL: 60 mg/dL (ref >39)
LDL Calculated: 128 mg/dL — ABNORMAL HIGH (ref 0–99)
Triglycerides: 86 mg/dL (ref 0–149)
VLDL Cholesterol Cal: 17 mg/dL (ref 5–40)

## 2019-04-09 LAB — TSH: TSH: 0.445 u[IU]/mL — ABNORMAL LOW (ref 0.450–4.500)

## 2019-04-11 ENCOUNTER — Other Ambulatory Visit: Payer: Self-pay | Admitting: Physician Assistant

## 2019-04-12 NOTE — Telephone Encounter (Signed)
Hasn't followed up since 02/04/2019 Last fill 01/09/2019

## 2019-04-25 ENCOUNTER — Other Ambulatory Visit: Payer: Self-pay

## 2019-04-25 MED ORDER — HYDROXYZINE HCL 25 MG PO TABS: 50.0000 mg | ORAL_TABLET | Freq: Four times a day (QID) | ORAL | 0 refills | Status: DC | PRN

## 2019-05-06 ENCOUNTER — Telehealth: Payer: Self-pay | Admitting: Family Medicine

## 2019-05-06 NOTE — Telephone Encounter (Signed)
Levothyroxine  ?  Pt requesting thyroid medication refill. [facility] pharmacy faxed over request 3x  Pt has appt on 9/10

## 2019-05-07 ENCOUNTER — Other Ambulatory Visit: Payer: Self-pay | Admitting: *Deleted

## 2019-05-07 MED ORDER — LEVOTHYROXINE SODIUM 100 MCG PO TABS: 100.0000 ug | ORAL_TABLET | Freq: Every day | ORAL | 0 refills | Status: DC

## 2019-05-07 NOTE — Telephone Encounter (Signed)
Prescription sent 30 days

## 2019-05-09 ENCOUNTER — Ambulatory Visit: Payer: PPO | Admitting: Gastroenterology

## 2019-05-09 ENCOUNTER — Other Ambulatory Visit (INDEPENDENT_AMBULATORY_CARE_PROVIDER_SITE_OTHER): Payer: PPO

## 2019-05-09 ENCOUNTER — Ambulatory Visit: Payer: PPO | Admitting: Family Medicine

## 2019-05-09 ENCOUNTER — Encounter: Payer: Self-pay | Admitting: Gastroenterology

## 2019-05-09 VITALS — BP 130/60 | HR 76 | Temp 99.6°F | Ht 64.0 in | Wt 181.0 lb

## 2019-05-09 DIAGNOSIS — R1310 Dysphagia, unspecified: Secondary | ICD-10-CM

## 2019-05-09 DIAGNOSIS — R194 Change in bowel habit: Secondary | ICD-10-CM | POA: Diagnosis not present

## 2019-05-09 DIAGNOSIS — Z8619 Personal history of other infectious and parasitic diseases: Secondary | ICD-10-CM

## 2019-05-09 DIAGNOSIS — K5900 Constipation, unspecified: Secondary | ICD-10-CM

## 2019-05-09 DIAGNOSIS — K219 Gastro-esophageal reflux disease without esophagitis: Secondary | ICD-10-CM

## 2019-05-09 DIAGNOSIS — R6889 Other general symptoms and signs: Secondary | ICD-10-CM | POA: Diagnosis not present

## 2019-05-09 DIAGNOSIS — R0989 Other specified symptoms and signs involving the circulatory and respiratory systems: Secondary | ICD-10-CM

## 2019-05-09 LAB — HEPATIC FUNCTION PANEL
ALT: 12 U/L (ref 0–35)
AST: 18 U/L (ref 0–37)
Albumin: 4.3 g/dL (ref 3.5–5.2)
Alkaline Phosphatase: 58 U/L (ref 39–117)
Bilirubin, Direct: 0 mg/dL (ref 0.0–0.3)
Total Bilirubin: 0.3 mg/dL (ref 0.2–1.2)
Total Protein: 6.6 g/dL (ref 6.0–8.3)

## 2019-05-09 LAB — TSH: TSH: 1.74 u[IU]/mL (ref 0.35–4.50)

## 2019-05-09 LAB — PROTIME-INR
INR: 1.1 ratio — ABNORMAL HIGH (ref 0.8–1.0)
Prothrombin Time: 12.3 s (ref 9.6–13.1)

## 2019-05-09 MED ORDER — OMEPRAZOLE 40 MG PO CPDR: 40.0000 mg | DELAYED_RELEASE_CAPSULE | Freq: Two times a day (BID) | ORAL | 3 refills | Status: DC

## 2019-05-09 NOTE — Patient Instructions (Signed)
Your provider has requested that you go to the basement level for lab work before leaving today. Press "B" on the elevator. The lab is located at the first door on the left as you exit the elevator.  We have sent the following medications to your pharmacy for you to pick up at your convenience: Omeprazole  Start Miralax - mix 17 grams of miralax in 8 ounces of water or juice daily.  Start Fiber choice once daily. You may also start colace 200mg  once daily as well.   You have been scheduled for an endoscopy. Please follow written instructions given to you at your visit today. If you use inhalers (even only as needed), please bring them with you on the day of your procedure.  If you are age 20 or older, your body mass index should be between 23-30. Your Body mass index is 31.07 kg/m. If this is out of the aforementioned range listed, please consider follow up with your Primary Care Provider.  If you are age 4 or younger, your body mass index should be between 19-25. Your Body mass index is 31.07 kg/m. If this is out of the aformentioned range listed, please consider follow up with your Primary Care Provider.   Thank you for choosing me and [facility].  Dr. Rush Landmark

## 2019-05-09 NOTE — Progress Notes (Signed)
[place] VISIT   Primary Care Provider Wendie Agreste, MD [address] [place] [address] [phone]  Referring Provider Wendie Agreste, MD [address] [place],  [place] [address] [phone]  Patient Profile: Becky Lawson is a 68 y.o. female with a pmh significant for chronic hepatitis C status post treatment in 2000 14/15 at [facility], bipolar disorder, chronic renal insufficiency, anxiety/MDD, hypertension, hyperlipidemia, sleep apnea, GERD.  The patient presents to the [facility] for an evaluation and management of problem(s) noted below:  Problem List 1. Chronic throat clearing   2. Gastroesophageal reflux disease, esophagitis presence not specified   3. Constipation, unspecified constipation type   4. Change in bowel habit   5. Pill Dysphagia   6. Hx of hepatitis C     History of Present Illness This is the patient's first visit to the outpatient [place] clinic.  The patient states that she has had issues of chronic throat clearing for years.  She recently saw an ENT provider and had a scope down her nose but was told things looked okay.  She does not know who that provider is other than he was in [place].  She has been on omeprazole 40 mg at nighttime for quite a while which has been helpful for her regards to acid reflux symptoms.  She does not have breakthrough symptoms significantly.  She has never taken this medication twice daily.  She recalls some episodes of pill dysphagia however liquids and solid foods have no problems passing.  She has had an upper endoscopy though it has been greater than 20 years.  She has had a colonoscopy greater than 10 years ago and underwent Cologuard testing 2 years ago which was reported to be negative.  She has constipation and a change in her bowel habits over the course of the last 5 to 6 months.  She is using MiraLAX intermittently to help her with her constipation.  She has noted  no issues of rectal bleeding (melena or maroon stools or hematochezia).  She has a history of hepatitis C that was treated in 2000 13/2014 on a clinical trial.  She recalls she is negative on her more recent lab evaluation.  She cannot recall if she was vaccinated to hepatitis A and B although she thinks she was as she was previously a Marine scientist.  GI Review of Systems Positive as above Negative for odynophagia, nocturnal cough, abdominal pain, nausea, vomiting  Review of Systems General: Denies fevers/chills/weight loss HEENT: Denies oral lesions Cardiovascular: Denies chest pain/palpitations Pulmonary: Denies shortness of breath/nocturnal cough Gastroenterological: See HPI Genitourinary: Denies darkened urine or hematuria Hematological: Denies easy bruising/bleeding Endocrine: Denies temperature intolerance Dermatological: Denies jaundice Psychological: Mood is stable   Medications Current Outpatient Medications  Medication Sig Dispense Refill   ALPRAZolam (XANAX) 1 MG tablet TAKE 1 TABLET AT BEDTIME AND 1 TABLET AS NEEDED 110 tablet 0   amLODipine (NORVASC) 5 MG tablet Take 1 tablet (5 mg total) by mouth daily. 90 tablet 1   buPROPion (WELLBUTRIN XL) 150 MG 24 hr tablet Take 1 tablet (150 mg total) by mouth daily. Take in addition to the 300 mg to equal 450 mg/day. 90 tablet 0   buPROPion (WELLBUTRIN XL) 300 MG 24 hr tablet TAKE 1 TABLET BY MOUTH EVERY DAY 90 tablet 0   candesartan (ATACAND) 32 MG tablet Take 1 tablet (32 mg total) by mouth daily. 90 tablet 1   carbidopa-levodopa (SINEMET IR) 25-100 MG tablet TAKE 1  TABLET BY MOUTH EVERYDAY AT BEDTIME 90 tablet 1   Cholecalciferol (VITAMIN D-3) 5000 units TABS Take 5,000 Units daily by mouth. In addition to the rx high dose once weekly vitamin D of 50Ku. 30 tablet    Coenzyme Q10 (CO Q-10) 300 MG CAPS Take 1 capsule daily by mouth.     docusate sodium (COLACE) 100 MG capsule Take 1 capsule (100 mg total) by mouth 2 (two) times  daily. 10 capsule 0   DULoxetine (CYMBALTA) 30 MG capsule TAKE 1 CAPSULE BY MOUTH EVERY DAY 90 capsule 1   Ginkgo Biloba 40 MG TABS Take 1 tablet daily by mouth.     HYDROcodone-acetaminophen (NORCO) 10-325 MG tablet Take 1 tablet by mouth 3 (three) times daily.      hydrOXYzine (ATARAX/VISTARIL) 25 MG tablet Take 2 tablets (50 mg total) by mouth every 6 (six) hours as needed for anxiety or itching. 240 tablet 0   lamoTRIgine (LAMICTAL) 200 MG tablet TAKE 1 TABLET BY MOUTH TWICE A DAY 180 tablet 0   levothyroxine (SYNTHROID) 100 MCG tablet Take 1 tablet (100 mcg total) by mouth daily before breakfast. 30 tablet 0   lidocaine (XYLOCAINE) 2 % solution Use as directed 15 mLs in the mouth or throat as needed (topical tid prn to nasal wound.). 15 mL 0   magnesium oxide (MAG-OX) 400 MG tablet Take 400 mg by mouth daily.     Omega-3 Fatty Acids (FISH OIL) 1200 MG CAPS Take by mouth.     pravastatin (PRAVACHOL) 40 MG tablet Take 1 tablet (40 mg total) by mouth daily. 90 tablet 3   traZODone (DESYREL) 100 MG tablet Take 3 tablets (300 mg total) by mouth daily. 270 tablet 0   triamcinolone cream (KENALOG) 0.1 % Apply 1 application topically 4 (four) times daily. 45 g 0   Vitamin D, Ergocalciferol, (DRISDOL) 1.25 MG (50000 UT) CAPS capsule TAKE 1 CAPSULE (50,000 UNITS TOTAL) BY MOUTH 2 (TWO) TIMES A WEEK. 26 capsule 1   vitamin E 600 UNIT capsule Take 600 Units by mouth daily.     omeprazole (PRILOSEC) 40 MG capsule Take 1 capsule (40 mg total) by mouth 2 (two) times daily. 60 capsule 3   Current Facility-Administered Medications  Medication Dose Route Frequency Provider Last Rate Last Dose   cyanocobalamin ((VITAMIN B-12)) injection 1,000 mcg  1,000 mcg Intramuscular Q30 days Shawnee Knapp, MD   1,000 mcg at 07/12/18 1652    Allergies Allergies  Allergen Reactions   No Known Allergies     Histories Past Medical History:  Diagnosis Date   Anxiety    Arthritis    Benign cyst of  right kidney 2014   2.7 cm right kidney cyst seen on imaging 2014 but was c/o right flank pain with new persistent proteinuria (fortunately hematuria had resolved) so repeat US 11/2016 showed right kidney still with 2.6 cm simple cyst and otherwise nml.   Bipolar 1 disorder (HCC)    Chronic kidney disease    Depression    GERD (gastroesophageal reflux disease)    Heart murmur    Hepatitis C    resolved completely after treatment in 2014, genotype 1b, followed at [facility]   Hyperlipidemia    Hypertension    Jaundice 11/01/2012   Pruritic disorder 02/13/2013   Sleep apnea    Was diagnosed approximately 20 years ago, but does not wear CPAP patient stated "I gave it back.Marland KitchenMarland KitchenI couldn't wear that thing it was horrible"   Spinal stenosis  Past Surgical History:  Procedure Laterality Date   ABDOMINAL HYSTERECTOMY     COLONOSCOPY W/ POLYPECTOMY     JOINT REPLACEMENT Bilateral    knee   KNEE ARTHROSCOPY Bilateral    PARTIAL HYSTERECTOMY     THYROID LOBECTOMY Left 06/16/2016   THYROID LOBECTOMY Left 06/16/2016   Procedure: LEFT THYROID LOBECTOMY;  Surgeon: Greer Pickerel, MD;  Location: [facility];  Service: General;  Laterality: Left;   Social History   Socioeconomic History   Marital status: Divorced    Spouse name: Not on file   Number of children: Not on file   Years of education: Not on file   Highest education level: Not on file  Occupational History   Occupation: Geophysical data processor  Social Needs   Financial resource strain: Not on file   Food insecurity    Worry: Not on file    Inability: Not on file   Transportation needs    Medical: Not on file    Non-medical: Not on file  Tobacco Use   Smoking status: Former Smoker    Packs/day: 1.00    Years: 15.00    Pack years: 15.00    Quit date: 06/30/1979    Years since quitting: 39.8   Smokeless tobacco: Never Used  Substance and Sexual Activity   Alcohol use: Yes    Alcohol/week: 0.0 - 1.0 standard  drinks   Drug use: No   Sexual activity: Yes    Birth control/protection: None  Lifestyle   Physical activity    Days per week: Not on file    Minutes per session: Not on file   Stress: Not on file  Relationships   Social connections    Talks on phone: Not on file    Gets together: Not on file    Attends religious service: Not on file    Active member of club or organization: Not on file    Attends meetings of clubs or organizations: Not on file    Relationship status: Not on file   Intimate partner violence    Fear of current or ex partner: Not on file    Emotionally abused: Not on file    Physically abused: Not on file    Forced sexual activity: Not on file  Other Topics Concern   Not on file  Social History Narrative   Divorced. Education: [employer]. Exercise: No.   Family History  Problem Relation Age of Onset   Diabetes Mother    Heart disease Mother    Hyperlipidemia Mother    Hypertension Mother    Stroke Mother    Hypertension Father    Parkinson's disease Father    Heart disease Father    Hyperlipidemia Father    Hemachromatosis Daughter    Thyroid disease Neg Hx    Colon cancer Neg Hx    Esophageal cancer Neg Hx    Inflammatory bowel disease Neg Hx    Liver disease Neg Hx    Pancreatic cancer Neg Hx    Rectal cancer Neg Hx    Stomach cancer Neg Hx    I have reviewed her medical, social, and family history in detail and updated the electronic medical record as necessary.    PHYSICAL EXAMINATION  BP 130/60    Pulse 76    Temp 99.6 F (37.6 C)    Ht 5\' 4"  (1.626 m)    Wt 181 lb (82.1 kg)    BMI 31.07 kg/m  Wt Readings from Last 3  Encounters:  05/09/19 181 lb (82.1 kg)  04/08/19 176 lb 9.6 oz (80.1 kg)  03/26/19 179 lb 9.6 oz (81.5 kg)  GEN: NAD, appears stated age, doesn't appear chronically ill PSYCH: Cooperative, without pressured speech EYE: Conjunctivae pink, sclerae anicteric ENT: MMM, without oral ulcers, no erythema or  exudates noted NECK: Supple CV: RR without R/Gs  RESP: CTAB posteriorly, without wheezing GI: NABS, soft, mildly protuberant abdomen, NT, without rebound or guarding, no HSM appreciated MSK/EXT: Trace bilateral lower extremity edema SKIN: No jaundice NEURO:  Alert & Oriented x 3, no focal deficits   REVIEW OF DATA  I reviewed the following data at the time of this encounter:  GI Procedures and Studies  Reports colonoscopy greater than 10 years ago unclear findings Reports prior endoscopy greater than 20 years ago unclear findings  Laboratory Studies  Reviewed those in epic  Imaging Studies  April 2020 CT abdomen and pelvis with contrast IMPRESSION: 1.  No acute intra-abdominal process. 2. New 1.0 cm indeterminate lesion in the anterior left kidney. Recommend renal protocol MRI with and without contrast for further evaluation. 3.  Aortic atherosclerosis (ICD10-I70.0).  April 2020 MRI abdomen IMPRESSION: 1. Left renal lesion of concern represents a hemorrhagic/proteinaceous cyst. 2.  No acute abdominal process. 3.  Aortic Atherosclerosis (ICD10-I70.0).   ASSESSMENT  Ms. Heino is a 68 y.o. female with a pmh significant for chronic hepatitis C status post treatment in 2000 14/15 at [facility], bipolar disorder, chronic renal insufficiency, anxiety/MDD, hypertension, hyperlipidemia, sleep apnea, GERD.  The patient is seen today for evaluation and management of:  1. Chronic throat clearing   2. Gastroesophageal reflux disease, esophagitis presence not specified   3. Constipation, unspecified constipation type   4. Change in bowel habit   5. Pill Dysphagia   6. Hx of hepatitis C    The patient is hemodynamically and clinically stable.  Patient just had an evaluation by ENT which we will try to get the records for to review.  She does not describe the patient necessarily having been told she has LPR but she could have atypical symptoms of heartburn that may be causing her issues.  She  is on a good amount of moderate dosing of her PPI but with her symptoms I do think it is worthwhile for [place] to give an empiric trial before we move forward with a diagnostic endoscopy.  We will increase her PPI to 40 mg twice daily over the course the next 4 to 6 weeks.  After that time.  We will plan to proceed with a diagnostic endoscopy and see how she is doing.  If symptoms have improved while being on PPI therapy great then we will see if there is anything on the upper endoscopy that would require longstanding high-dose PPI therapy such as Barrett's esophagus or significant esophagitis.  If not there may be a role of pH impedance testing for her.  Manometry is not necessarily required at this point however if she is found to have a large hiatal hernia or something that would require a fundoplication then may be worthwhile to consider the manometry although dysphagia to pills at this point is not so significant but is the only thing present from a dysphagia perspective.  Her changes in bowel habits are such that she had some benefit with the use of MiraLAX.  She is hesitant about doing another colonoscopy.  I think this is okay to wait on considering a diagnostic colonoscopy since she had a negative Cologuard  performed within the last 2 years.  However if her symptoms do not improve then we will need to consider that.  We will adjust her bowel regimen as below. I like to get a little bit better sense of her hepatitis C and make sure that she is vaccinated hepatitis A and hepatitis B as she remains a Dietitian and to get some laboratories to evaluate that.  She has had recent imaging of her liver which shows no evidence of HCC and I do not expect that based on the rest of her laboratories which do not suggest that she has portal hypertension.  The risks and benefits of endoscopic evaluation were discussed with the patient; these include but are not limited to the risk of perforation, infection, bleeding,  missed lesions, lack of diagnosis, severe illness requiring hospitalization, as well as anesthesia and sedation related illnesses.  The patient is agreeable to proceed.  All patient questions were answered, to the best of my ability, and the patient agrees to the aforementioned plan of action with follow-up as indicated.   PLAN  Laboratories as outlined below Increase omeprazole to 40 twice daily Diagnostic endoscopy (esophageal/gastric biopsies at minimum) MiraLAX 1-2 times daily Colace 200 mg 1-2 times daily Fiber supplementation once daily (preference FiberCon but may use any over-the-counter fiber supplement if needed) HAV/HBV immunization if serology suggests she is not immune Try to obtain records from ENT provider (patient to call us back with the name so that we can send a release of information) Hold on pH impedance and manometry testing for now   Orders Placed This Encounter  Procedures   TSH   INR/PT   Calcium, ionized   Hepatic function panel   Hepatitis A antibody, total   Hepatitis B Surface AntiBODY   Hepatitis B surface antigen   Hepatitis B Core Antibody, total   HCV RNA NAA Qual rfx to Quant   Ambulatory referral to Gastroenterology    New Prescriptions   OMEPRAZOLE (PRILOSEC) 40 MG CAPSULE    Take 1 capsule (40 mg total) by mouth 2 (two) times daily.   Modified Medications   No medications on file    Planned Follow Up No follow-ups on file.   Justice Britain, MD [facility] Office # [phone]

## 2019-05-10 ENCOUNTER — Encounter: Payer: Self-pay | Admitting: Gastroenterology

## 2019-05-10 DIAGNOSIS — K5900 Constipation, unspecified: Secondary | ICD-10-CM | POA: Insufficient documentation

## 2019-05-10 DIAGNOSIS — R194 Change in bowel habit: Secondary | ICD-10-CM | POA: Insufficient documentation

## 2019-05-10 DIAGNOSIS — R1310 Dysphagia, unspecified: Secondary | ICD-10-CM | POA: Insufficient documentation

## 2019-05-10 LAB — HEPATITIS A ANTIBODY, TOTAL: Hepatitis A AB,Total: NONREACTIVE

## 2019-05-10 LAB — CALCIUM, IONIZED: Calcium, Ion: 5 mg/dL (ref 4.8–5.6)

## 2019-05-10 LAB — HEPATITIS B CORE ANTIBODY, TOTAL: Hep B Core Total Ab: NONREACTIVE

## 2019-05-10 LAB — HEPATITIS B SURFACE ANTIBODY,QUALITATIVE: Hep B S Ab: NONREACTIVE

## 2019-05-10 LAB — HEPATITIS B SURFACE ANTIGEN: Hepatitis B Surface Ag: NONREACTIVE

## 2019-05-11 LAB — HCV RNA NAA QUAL RFX TO QUANT: HCV RNA NAA Qualitative: NEGATIVE

## 2019-05-12 ENCOUNTER — Other Ambulatory Visit: Payer: Self-pay | Admitting: Physician Assistant

## 2019-05-15 ENCOUNTER — Telehealth: Payer: Self-pay | Admitting: Gastroenterology

## 2019-05-15 NOTE — Telephone Encounter (Signed)
Pt called to inform Dr. Rush Landmark about that the ENT that she saw was Dr. Lucia Gaskins, they will be faxing pt's records.

## 2019-05-15 NOTE — Telephone Encounter (Signed)
Thank you for the update.  I look forward to reviewing the ENT records. GM

## 2019-05-16 ENCOUNTER — Ambulatory Visit: Payer: PPO | Admitting: Family Medicine

## 2019-05-17 ENCOUNTER — Encounter: Payer: PPO | Admitting: Gastroenterology

## 2019-05-23 ENCOUNTER — Other Ambulatory Visit: Payer: Self-pay

## 2019-05-23 ENCOUNTER — Ambulatory Visit (INDEPENDENT_AMBULATORY_CARE_PROVIDER_SITE_OTHER): Payer: PPO | Admitting: Family Medicine

## 2019-05-23 ENCOUNTER — Encounter: Payer: Self-pay | Admitting: Family Medicine

## 2019-05-23 VITALS — BP 109/58 | HR 78 | Temp 98.7°F | Resp 14 | Wt 182.6 lb

## 2019-05-23 DIAGNOSIS — R5383 Other fatigue: Secondary | ICD-10-CM | POA: Diagnosis not present

## 2019-05-23 DIAGNOSIS — R251 Tremor, unspecified: Secondary | ICD-10-CM

## 2019-05-23 DIAGNOSIS — I10 Essential (primary) hypertension: Secondary | ICD-10-CM

## 2019-05-23 DIAGNOSIS — K219 Gastro-esophageal reflux disease without esophagitis: Secondary | ICD-10-CM

## 2019-05-23 DIAGNOSIS — Z23 Encounter for immunization: Secondary | ICD-10-CM

## 2019-05-23 DIAGNOSIS — F319 Bipolar disorder, unspecified: Secondary | ICD-10-CM | POA: Diagnosis not present

## 2019-05-23 NOTE — Progress Notes (Signed)
Subjective:    Patient ID: Becky Lawson, female    DOB: [birth year], 68 y.o.   MRN: [id number]  HPI Becky Lawson is a 68 y.o. female Presents today for: Chief Complaint  Patient presents with  . Hypertension    1 month f/u for bp and fatigue   Hypertension: BP Readings from Last 3 Encounters:  05/23/19 (!) 109/58  05/09/19 130/60  04/08/19 113/64   Lab Results  Component Value Date   CREATININE 0.96 04/08/2019  home readings: 137/63.  Fatigue: Same as last OV.  Followed by psychiatrist. New psychaitrist 9/24.  Multiple meds. Discussed possible polypharmacy. Tried lower dose of trazodone a month ago - more trouble sleeping on 200mg  dose. Back on 300mg  dose.  Considering quitting work next April.  On alprazolam at night only.  Rare hydroxyzine - not with trazodone or xanax.  Repeat TSh ok. 0.445 - to 1.74 on 9/3.  CBC, sed rate, CMP overall reassuring at August labs.  prilosec 40mg  BID has been helpful since GI eval. Cancelled endoscopy for now as better.   Plans on following up with neuro regarding tremors.    Patient Active Problem List   Diagnosis Date Noted  . Constipation 05/10/2019  . Change in bowel habit 05/10/2019  . Dysphagia 05/10/2019  . Tremor 07/12/2018  . Prediabetes 07/12/2018  . Polypharmacy 07/12/2018  . Unintentional weight loss of more than 10 pounds 07/12/2018  . OSA (obstructive sleep apnea) 06/02/2018  . Fatty liver 04/18/2017  . GERD (gastroesophageal reflux disease) 10/28/2016  . 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 Chronic throat clearing 06/24/2016  . S/P partial thyroidectomy 06/16/2016  . Multinodular goiter 06/02/2016  . Thyroid nodule 02/08/2016  . Hx of hepatitis C 05/19/2015  . Vitamin D deficiency 05/19/2015  . Spinal stenosis of lumbar region 05/19/2015  . Hyperlipidemia 05/19/2015  . Bipolar disorder ([place]) 05/19/2015  . Essential hypertension 09/26/2013  . Undiagnosed cardiac murmurs 09/26/2013   Past Medical  History:  Diagnosis Date  . Anxiety   . Arthritis   . Benign cyst of right kidney 2014   2.7 cm right kidney cyst seen on imaging 2014 but was c/o right flank pain with new persistent proteinuria (fortunately hematuria had resolved) so repeat US 11/2016 showed right kidney still with 2.6 cm simple cyst and otherwise nml.  . Bipolar 1 disorder ([place])   . Chronic kidney disease   . Depression   . GERD (gastroesophageal reflux disease)   . Heart murmur   . Hepatitis C    resolved completely after treatment in 2014, genotype 1b, followed at [facility]  . Hyperlipidemia   . Hypertension   . Jaundice 11/01/2012  . Pruritic disorder 02/13/2013  . Sleep apnea    Was diagnosed approximately 20 years ago, but does not wear CPAP patient stated "I gave it back.Marland KitchenMarland KitchenI couldn't wear that thing it was horrible"  . Spinal stenosis    Past Surgical History:  Procedure Laterality Date  . ABDOMINAL HYSTERECTOMY    . COLONOSCOPY W/ POLYPECTOMY    . JOINT REPLACEMENT Bilateral    knee  . KNEE ARTHROSCOPY Bilateral   . PARTIAL HYSTERECTOMY    . THYROID LOBECTOMY Left 06/16/2016  . THYROID LOBECTOMY Left 06/16/2016   Procedure: LEFT THYROID LOBECTOMY;  Surgeon: Greer Pickerel, MD;  Location: [facility];  Service: General;  Laterality: Left;   Allergies  Allergen Reactions  . No Known Allergies    Prior to Admission medications   Medication Sig Start Date End Date Taking?  Authorizing Provider  ALPRAZolam Duanne Moron) 1 MG tablet TAKE 1 TABLET AT BEDTIME AND 1 TABLET AS NEEDED 04/12/19  Yes Hurst, Teresa T, PA-C  amLODipine (NORVASC) 5 MG tablet Take 1 tablet (5 mg total) by mouth daily. 04/08/19  Yes Wendie Agreste, MD  buPROPion (WELLBUTRIN XL) 150 MG 24 hr tablet TAKE 1 TABLET (150 MG TOTAL) BY MOUTH DAILY. TAKE IN ADDITION TO THE 300 MG TO EQUAL 450 MG/DAY. 05/14/19  Yes Hurst, Teresa T, PA-C  buPROPion (WELLBUTRIN XL) 300 MG 24 hr tablet TAKE 1 TABLET BY MOUTH EVERY DAY 04/04/19  Yes Hurst, Teresa T, PA-C  candesartan  (ATACAND) 32 MG tablet Take 1 tablet (32 mg total) by mouth daily. 07/12/18  Yes Shawnee Knapp, MD  carbidopa-levodopa (SINEMET IR) 25-100 MG tablet TAKE 1 TABLET BY MOUTH EVERYDAY AT BEDTIME 08/21/18  Yes Shawnee Knapp, MD  Cholecalciferol (VITAMIN D-3) 5000 units TABS Take 5,000 Units daily by mouth. In addition to the rx high dose once weekly vitamin D of 50Ku. 07/24/17  Yes Shawnee Knapp, MD  Coenzyme Q10 (CO Q-10) 300 MG CAPS Take 1 capsule daily by mouth.   Yes [provider]  docusate sodium (COLACE) 100 MG capsule Take 1 capsule (100 mg total) by mouth 2 (two) times daily. 05/13/17  Yes Shawnee Knapp, MD  DULoxetine (CYMBALTA) 30 MG capsule TAKE 1 CAPSULE BY MOUTH EVERY DAY 03/04/19  Yes Hurst, Teresa T, PA-C  Ginkgo Biloba 40 MG TABS Take 1 tablet daily by mouth.   Yes [provider]  HYDROcodone-acetaminophen (NORCO) 10-325 MG tablet Take 1 tablet by mouth 3 (three) times daily.    Yes [provider]  hydrOXYzine (ATARAX/VISTARIL) 25 MG tablet Take 2 tablets (50 mg total) by mouth every 6 (six) hours as needed for anxiety or itching. 04/25/19  Yes Donnal Moat T, PA-C  lamoTRIgine (LAMICTAL) 200 MG tablet TAKE 1 TABLET BY MOUTH TWICE A DAY 02/05/19  Yes Hurst, Teresa T, PA-C  levothyroxine (SYNTHROID) 100 MCG tablet Take 1 tablet (100 mcg total) by mouth daily before breakfast. 05/07/19  Yes Wendie Agreste, MD  magnesium oxide (MAG-OX) 400 MG tablet Take 400 mg by mouth daily.   Yes [provider]  Omega-3 Fatty Acids (FISH OIL) 1200 MG CAPS Take by mouth.   Yes [provider]  omeprazole (PRILOSEC) 40 MG capsule Take 1 capsule (40 mg total) by mouth 2 (two) times daily. 05/09/19  Yes Mansouraty, Telford Nab., MD  pravastatin (PRAVACHOL) 40 MG tablet Take 1 tablet (40 mg total) by mouth daily. 07/12/18  Yes Shawnee Knapp, MD  traZODone (DESYREL) 100 MG tablet Take 3 tablets (300 mg total) by mouth daily. 04/02/19  Yes Hurst, Helene Kelp T, PA-C  Vitamin D,  Ergocalciferol, (DRISDOL) 1.25 MG (50000 UT) CAPS capsule TAKE 1 CAPSULE (50,000 UNITS TOTAL) BY MOUTH 2 (TWO) TIMES A WEEK. 10/08/18  Yes Shawnee Knapp, MD  vitamin E 600 UNIT capsule Take 600 Units by mouth daily.   Yes [provider]   Social History   Socioeconomic History  . Marital status: Divorced    Spouse name: Not on file  . Number of children: Not on file  . Years of education: Not on file  . Highest education level: Not on file  Occupational History  . Occupation: Geophysical data processor  Social Needs  . Financial resource strain: Not on file  . Food insecurity    Worry: Not on file  Inability: Not on file  . Transportation needs    Medical: Not on file    Non-medical: Not on file  Tobacco Use  . Smoking status: Former Smoker    Packs/day: 1.00    Years: 15.00    Pack years: 15.00    Quit date: 06/30/1979    Years since quitting: 39.9  . Smokeless tobacco: Never Used  Substance and Sexual Activity  . Alcohol use: Yes    Alcohol/week: 0.0 - 1.0 standard drinks  . Drug use: No  . Sexual activity: Yes    Birth control/protection: None  Lifestyle  . Physical activity    Days per week: Not on file    Minutes per session: Not on file  . Stress: Not on file  Relationships  . Social Herbalist on phone: Not on file    Gets together: Not on file    Attends religious service: Not on file    Active member of club or organization: Not on file    Attends meetings of clubs or organizations: Not on file    Relationship status: Not on file  . Intimate partner violence    Fear of current or ex partner: Not on file    Emotionally abused: Not on file    Physically abused: Not on file    Forced sexual activity: Not on file  Other Topics Concern  . Not on file  Social History Narrative   Divorced. Education: [employer]. Exercise: No.    Review of Systems  Constitutional: Negative for fatigue and unexpected weight change.  Respiratory: Negative for chest  tightness and shortness of breath.   Cardiovascular: Negative for chest pain, palpitations and leg swelling.  Gastrointestinal: Negative for abdominal pain and blood in stool.  Neurological: Negative for dizziness, syncope, light-headedness and headaches.       Objective:   Physical Exam Vitals signs reviewed.  Constitutional:      Appearance: She is well-developed.  HENT:     Head: Normocephalic and atraumatic.  Eyes:     Conjunctiva/sclera: Conjunctivae normal.     Pupils: Pupils are equal, round, and reactive to light.  Neck:     Vascular: No carotid bruit.  Cardiovascular:     Rate and Rhythm: Normal rate and regular rhythm.     Heart sounds: Normal heart sounds.  Pulmonary:     Effort: Pulmonary effort is normal.     Breath sounds: Normal breath sounds.  Abdominal:     Palpations: Abdomen is soft. There is no pulsatile mass.     Tenderness: There is no abdominal tenderness.  Skin:    General: Skin is warm and dry.  Neurological:     Mental Status: She is alert and oriented to person, place, and time.  Psychiatric:        Behavior: Behavior normal.    Vitals:   05/23/19 1536  BP: (!) 109/58  Pulse: 78  Resp: 14  Temp: 98.7 F (37.1 C)  TempSrc: Oral  SpO2: 97%  Weight: 182 lb 9.6 oz (82.8 kg)       Assessment & Plan:  Becky Lawson is a 68 y.o. female Fatigue, unspecified type  -Still suspect polypharmacy, psychiatric meds may be contributing.  Borderline blood pressure, option of half dose amlodipine discussed.  Follow-up with psychiatrist as planned with med review.  RTC precautions if acute worsening  Need for prophylactic vaccination and inoculation against influenza - Plan: Flu Vaccine QUAD High Dose(Fluad)  Essential hypertension  -  As above, option of low-dose amlodipine.  Monitor home readings.  Tremor  -Follow-up with neurology as planned  Gastroesophageal reflux disease, esophagitis presence not specified  -Improved with higher dosing of PPI.   Continue follow-up with gastroenterology as planned  Bipolar affective disorder, remission status unspecified ([place])  -New psychiatrist appointment pending.  As above would review medications and possible contribution to fatigue.  No orders of the defined types were placed in this encounter.  Patient Instructions   For fatigue I would discuss that with your new psychiatrist.   Can also look at medications at that time to see if some of those may be causing more fatigue than others and any other recommendations or changes.   BP reading today borderline - ok to try 1/2 pill of amlodipine, but keep a record of your blood pressures outside of the office and send me a message on mychart in next 2 weeks.   Follow up with neuro for tremor as planned.     If you have lab work done today you will be contacted with your lab results within the next 2 weeks.  If you have not heard from [place] then please contact us. The fastest way to get your results is to register for My Chart.   IF you received an x-ray today, you will receive an invoice from [facility]. Please contact [facility] at [phone] with questions or concerns regarding your invoice.   IF you received labwork today, you will receive an invoice from [place]. Please contact [facility] at [phone] with questions or concerns regarding your invoice.   Our billing staff will not be able to assist you with questions regarding bills from these companies.  You will be contacted with the lab results as soon as they are available. The fastest way to get your results is to activate your My Chart account. Instructions are located on the last page of this paperwork. If you have not heard from [place] regarding the results in 2 weeks, please contact this office.       Signed,   Merri Ray, MD [facility].  05/28/19 12:26 PM

## 2019-05-23 NOTE — Patient Instructions (Addendum)
For fatigue I would discuss that with your new psychiatrist.   Can also look at medications at that time to see if some of those may be causing more fatigue than others and any other recommendations or changes.   BP reading today borderline - ok to try 1/2 pill of amlodipine, but keep a record of your blood pressures outside of the office and send me a message on mychart in next 2 weeks.   Follow up with neuro for tremor as planned.     If you have lab work done today you will be contacted with your lab results within the next 2 weeks.  If you have not heard from [place] then please contact us. The fastest way to get your results is to register for My Chart.   IF you received an x-ray today, you will receive an invoice from [facility]. Please contact [facility] at [phone] with questions or concerns regarding your invoice.   IF you received labwork today, you will receive an invoice from [place]. Please contact [facility] at [phone] with questions or concerns regarding your invoice.   Our billing staff will not be able to assist you with questions regarding bills from these companies.  You will be contacted with the lab results as soon as they are available. The fastest way to get your results is to activate your My Chart account. Instructions are located on the last page of this paperwork. If you have not heard from [place] regarding the results in 2 weeks, please contact this office.

## 2019-05-28 ENCOUNTER — Encounter: Payer: Self-pay | Admitting: Family Medicine

## 2019-05-29 ENCOUNTER — Other Ambulatory Visit: Payer: Self-pay | Admitting: Physician Assistant

## 2019-05-29 DIAGNOSIS — M48061 Spinal stenosis, lumbar region without neurogenic claudication: Secondary | ICD-10-CM | POA: Diagnosis not present

## 2019-05-29 DIAGNOSIS — M25551 Pain in right hip: Secondary | ICD-10-CM | POA: Diagnosis not present

## 2019-05-29 DIAGNOSIS — Z79891 Long term (current) use of opiate analgesic: Secondary | ICD-10-CM | POA: Diagnosis not present

## 2019-05-29 NOTE — Telephone Encounter (Signed)
appt tomorrow but asking for 700 tablets?

## 2019-05-30 ENCOUNTER — Other Ambulatory Visit: Payer: Self-pay | Admitting: Family Medicine

## 2019-05-30 ENCOUNTER — Encounter: Payer: Self-pay | Admitting: Gastroenterology

## 2019-05-30 ENCOUNTER — Ambulatory Visit: Payer: PPO | Admitting: Physician Assistant

## 2019-05-30 NOTE — Progress Notes (Signed)
ENT evaluation by Dr. Radene Journey on 09/24/2018 Complains of frequent throat clearing with increased voice issues and history of acid reflux.  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  Fiberoptic laryngoscopy through the right nostril showed a normal nasopharynx with normal tongue, vallecula, epiglottis.  Vocal cords were clear bilaterally.  Piriform sinuses were clear with minimal arytenoid edema. Recommended to continue to take omeprazole before dinner, reassured about normal upper airway exam, trial of Flonase to see if it would help with postnasal congestion and drip and follow-up as needed  These records will be scanned into the chart

## 2019-05-30 NOTE — Telephone Encounter (Signed)
Forwarding medication refill request to the clinical pool for review. 

## 2019-06-15 ENCOUNTER — Other Ambulatory Visit: Payer: Self-pay | Admitting: Physician Assistant

## 2019-06-22 ENCOUNTER — Other Ambulatory Visit: Payer: Self-pay | Admitting: Physician Assistant

## 2019-06-24 NOTE — Telephone Encounter (Signed)
90 day supply, 720?

## 2019-06-25 ENCOUNTER — Ambulatory Visit: Payer: PPO | Admitting: Physician Assistant

## 2019-06-27 ENCOUNTER — Other Ambulatory Visit: Payer: Self-pay | Admitting: Physician Assistant

## 2019-07-16 ENCOUNTER — Ambulatory Visit (INDEPENDENT_AMBULATORY_CARE_PROVIDER_SITE_OTHER): Payer: Self-pay | Admitting: Physician Assistant

## 2019-07-16 ENCOUNTER — Other Ambulatory Visit: Payer: Self-pay

## 2019-07-16 DIAGNOSIS — F3175 Bipolar disorder, in partial remission, most recent episode depressed: Secondary | ICD-10-CM

## 2019-07-16 NOTE — Progress Notes (Signed)
Pt unable to have appt right now.  Is on the phone w/ another dr discussing 'concerning test results.'  She'll call back to R/S. No charge.

## 2019-07-17 ENCOUNTER — Other Ambulatory Visit: Payer: Self-pay | Admitting: Physician Assistant

## 2019-07-17 NOTE — Telephone Encounter (Signed)
Was suppose to have visit yesterday

## 2019-07-18 ENCOUNTER — Ambulatory Visit: Payer: PPO | Admitting: Family Medicine

## 2019-07-19 ENCOUNTER — Other Ambulatory Visit: Payer: Self-pay

## 2019-07-19 ENCOUNTER — Encounter: Payer: Self-pay | Admitting: Family Medicine

## 2019-07-19 ENCOUNTER — Telehealth (INDEPENDENT_AMBULATORY_CARE_PROVIDER_SITE_OTHER): Payer: PPO | Admitting: Family Medicine

## 2019-07-19 VITALS — BP 170/90 | Ht 64.0 in | Wt 179.0 lb

## 2019-07-19 DIAGNOSIS — R61 Generalized hyperhidrosis: Secondary | ICD-10-CM | POA: Diagnosis not present

## 2019-07-19 DIAGNOSIS — R030 Elevated blood-pressure reading, without diagnosis of hypertension: Secondary | ICD-10-CM | POA: Diagnosis not present

## 2019-07-19 DIAGNOSIS — I10 Essential (primary) hypertension: Secondary | ICD-10-CM | POA: Diagnosis not present

## 2019-07-19 DIAGNOSIS — R6883 Chills (without fever): Secondary | ICD-10-CM | POA: Diagnosis not present

## 2019-07-19 DIAGNOSIS — R5383 Other fatigue: Secondary | ICD-10-CM

## 2019-07-19 NOTE — Patient Instructions (Addendum)
I ordered a COVID-19 test to be obtained at the [facility].  They will be open again on Monday.  If you do get testing over the weekend elsewhere, please let me know.  I do recommend isolating yourself, avoiding others until those results are known.  If you do need to leave the house make sure to wear a mask at all times.  Please let me know if there are questions.   Return to the clinic or go to the nearest emergency room if any of your symptoms worsen or new symptoms occur.   Keep a record of your blood pressures outside of the office and bring them to the next office visit with your monitor.   How to Take Your Blood Pressure Blood pressure is a measurement of how strongly your blood is pressing against the walls of your arteries. Arteries are blood vessels that carry blood from your heart throughout your body. Your health care provider takes your blood pressure at each office visit. You can also take your own blood pressure at home with a blood pressure machine. You may need to take your own blood pressure:  To confirm a diagnosis of high blood pressure (hypertension).  To monitor your blood pressure over time.  To make sure your blood pressure medicine is working. Supplies needed: To take your blood pressure, you will need a blood pressure machine. You can buy a blood pressure machine, or blood pressure monitor, at most drugstores or online. There are several types of home blood pressure monitors. When choosing one, consider the following:  Choose a monitor that has an arm cuff.  Choose a cuff that wraps snugly around your upper arm. You should be able to fit only one finger between your arm and the cuff.  Do not choose a monitor that measures your blood pressure from your wrist or finger. Your health care provider can suggest a reliable monitor that will meet your needs. How to prepare To get the most accurate reading, avoid the following for 30 minutes before you check your  blood pressure:  Drinking caffeine.  Drinking alcohol.  Eating.  Smoking.  Exercising. Five minutes before you check your blood pressure:  Empty your bladder.  Sit quietly without talking in a dining chair, rather than in a soft couch or armchair. How to take your blood pressure To check your blood pressure, follow the instructions in the manual that came with your blood pressure monitor. If you have a digital blood pressure monitor, the instructions may be as follows: 1. Sit up straight. 2. Place your feet on the floor. Do not cross your ankles or legs. 3. Rest your left arm at the level of your heart on a table or desk or on the arm of a chair. 4. Pull up your shirt sleeve. 5. Wrap the blood pressure cuff around the upper part of your left arm, 1 inch (2.5 cm) above your elbow. It is best to wrap the cuff around bare skin. 6. Fit the cuff snugly around your arm. You should be able to place only one finger between the cuff and your arm. 7. Position the cord inside the groove of your elbow. 8. Press the power button. 9. Sit quietly while the cuff inflates and deflates. 10. Read the digital reading on the monitor screen and write it down (record it). 11. Wait 2-3 minutes, then repeat the steps, starting at step 1. What does my blood pressure reading mean? A blood pressure reading consists  of a higher number over a lower number. Ideally, your blood pressure should be below 120/80. The first ("top") number is called the systolic pressure. It is a measure of the pressure in your arteries as your heart beats. The second ("bottom") number is called the diastolic pressure. It is a measure of the pressure in your arteries as the heart relaxes. Blood pressure is classified into four stages. The following are the stages for adults who do not have a short-term serious illness or a chronic condition. Systolic pressure and diastolic pressure are measured in a unit called mm Hg. Normal  Systolic  pressure: below 120.  Diastolic pressure: below 80. Elevated  Systolic pressure: 578-469.  Diastolic pressure: below 80. Hypertension stage 1  Systolic pressure: 629-528.  Diastolic pressure: 41-32. Hypertension stage 2  Systolic pressure: 440 or above.  Diastolic pressure: 90 or above. You can have prehypertension or hypertension even if only the systolic or only the diastolic number in your reading is higher than normal. Follow these instructions at home:  Check your blood pressure as often as recommended by your health care provider.  Take your monitor to the next appointment with your health care provider to make sure: ? That you are using it correctly. ? That it provides accurate readings.  Be sure you understand what your goal blood pressure numbers are.  Tell your health care provider if you are having any side effects from blood pressure medicine. Contact a health care provider if:  Your blood pressure is consistently high. Get help right away if:  Your systolic blood pressure is higher than 180.  Your diastolic blood pressure is higher than 110. This information is not intended to replace advice given to you by your health care provider. Make sure you discuss any questions you have with your health care provider. Document Released: 01/29/2016 Document Revised: 08/04/2017 Document Reviewed: 01/29/2016 Elsevier Patient Education  2020 [employer].

## 2019-07-19 NOTE — Progress Notes (Signed)
Virtual Visit via Telephone Note  I connected with Becky Lawson on 07/19/19 at 3:20 PM by telephone and verified that I am speaking with the correct person using two identifiers.   I discussed the limitations, risks, security and privacy concerns of performing an evaluation and management service by telephone and the availability of in person appointments. I also discussed with the patient that there may be a patient responsible charge related to this service. The patient expressed understanding and agreed to proceed, consent obtained  Chief complaint:  Fatigue, med review   History of Present Illness: Becky Lawson is a 68 y.o. female  Fatigue See previous visits, discussed September 17.  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  She did have to return to a higher dose of 300 mg trazodone nightly, along with taking alprazolam night, rare hydroxyzine.  It appears her appointment on November 10 with [facility] was a telemed visit, but patient was unable to have appointment as on the phone with another provider.  Plan for calling back to reschedule. Will have following up with psych in a few days.   Labs in August were reassuring including CBC, sed rate, CMP.  TSH borderline at 0.445 in August up to 1.74 on September 3.  Fatigue is better with changing amlodipine and candesartan to bedtime.  Taking full pill.  BP 149/78 at home. Has not had checked at other office since here.  Has been in 140s usually.  No CP/HA/focal weakness/slurred speech.   Blood pressure was borderline low at September 17 visit, option of half dosing of amlodipine discussed. BP Readings from Last 3 Encounters:  07/19/19 (!) 170/90  05/23/19 (!) 109/58  05/09/19 130/60    Past 3 days - night sweats and chills/clammy feeling. Clammy feeling, sweaty. 203 episodes per day.  No measured fever.  No cough/dyspnea/bodyache/headache.  No change in  taste/smell. 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  No weight changes.  No missed doses of meds.  No dysuria/hematuria/abd pain or other infectious symptoms.    Patient Active Problem List   Diagnosis Date Noted  . Constipation 05/10/2019  . Change in bowel habit 05/10/2019  . Dysphagia 05/10/2019  . Tremor 07/12/2018  . Prediabetes 07/12/2018  . Polypharmacy 07/12/2018  . Unintentional weight loss of more than 10 pounds 07/12/2018  . OSA (obstructive sleep apnea) 06/02/2018  . Fatty liver 04/18/2017  . GERD (gastroesophageal reflux disease) 10/28/2016  . 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 Chronic throat clearing 06/24/2016  . S/P partial thyroidectomy 06/16/2016  . Multinodular goiter 06/02/2016  . Thyroid nodule 02/08/2016  . Hx of hepatitis C 05/19/2015  . Vitamin D deficiency 05/19/2015  . Spinal stenosis of lumbar region 05/19/2015  . Hyperlipidemia 05/19/2015  . Bipolar disorder ([employer]) 05/19/2015  . Essential hypertension 09/26/2013  . Undiagnosed cardiac murmurs 09/26/2013   Past Medical History:  Diagnosis Date  . Anxiety   . Arthritis   . Benign cyst of right kidney 2014   2.7 cm right kidney cyst seen on imaging 2014 but was c/o right flank pain with new persistent proteinuria (fortunately hematuria had resolved) so repeat US 11/2016 showed right kidney still with 2.6 cm simple cyst and otherwise nml.  . Bipolar 1 disorder ([place])   . Chronic kidney disease   . Depression   . GERD (gastroesophageal reflux disease)   . Heart murmur   .  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 Hyperlipidemia   . Hypertension   . Jaundice 11/01/2012  . Pruritic disorder 02/13/2013  . Sleep apnea    Was diagnosed approximately 20 years ago, but does not wear CPAP patient stated "I gave it back.Marland KitchenMarland KitchenI couldn't wear that thing it was horrible"  . Spinal stenosis    Past  Surgical History:  Procedure Laterality Date  . ABDOMINAL HYSTERECTOMY    . COLONOSCOPY W/ POLYPECTOMY    . JOINT REPLACEMENT Bilateral    knee  . KNEE ARTHROSCOPY Bilateral   . PARTIAL HYSTERECTOMY    . THYROID LOBECTOMY Left 06/16/2016  . THYROID LOBECTOMY Left 06/16/2016   Procedure: LEFT THYROID LOBECTOMY;  Surgeon: Greer Pickerel, MD;  Location: [facility];  Service: General;  Laterality: Left;   Allergies  Allergen Reactions  . No Known Allergies    Prior to Admission medications   Medication Sig Start Date End Date Taking? Authorizing Provider  ALPRAZolam Duanne Moron) 1 MG tablet TAKE 1 TABLET AT BEDTIME AND 1 TABLET AS NEEDED 07/19/19  Yes Hurst, Teresa T, PA-C  amLODipine (NORVASC) 5 MG tablet Take 1 tablet (5 mg total) by mouth daily. 04/08/19  Yes Wendie Agreste, MD  buPROPion (WELLBUTRIN XL) 150 MG 24 hr tablet TAKE 1 TABLET (150 MG TOTAL) BY MOUTH DAILY. TAKE IN ADDITION TO THE 300 MG TO EQUAL 450 MG/DAY. 05/14/19  Yes Hurst, Teresa T, PA-C  buPROPion (WELLBUTRIN XL) 300 MG 24 hr tablet TAKE 1 TABLET BY MOUTH EVERY DAY 06/27/19  Yes Hurst, Teresa T, PA-C  candesartan (ATACAND) 32 MG tablet Take 1 tablet (32 mg total) by mouth daily. 07/12/18  Yes Shawnee Knapp, MD  carbidopa-levodopa (SINEMET IR) 25-100 MG tablet TAKE 1 TABLET BY MOUTH EVERYDAY AT BEDTIME 08/21/18  Yes Shawnee Knapp, MD  docusate sodium (COLACE) 100 MG capsule Take 1 capsule (100 mg total) by mouth 2 (two) times daily. 05/13/17  Yes Shawnee Knapp, MD  HYDROcodone-acetaminophen [facility]) 10-325 MG tablet Take 1 tablet by mouth 3 (three) times daily.    Yes [provider]  hydrOXYzine (ATARAX/VISTARIL) 25 MG tablet TAKE 1-2 TABLETS EVERY 6 HOURS AS NEEDED PER ANXIETY 06/24/19  Yes Hurst, Teresa T, PA-C  lamoTRIgine (LAMICTAL) 200 MG tablet TAKE 1 TABLET BY MOUTH TWICE A DAY 02/05/19  Yes Hurst, Teresa T, PA-C  levothyroxine (SYNTHROID) 100 MCG tablet TAKE 1 TABLET (100 MCG TOTAL) BY MOUTH DAILY BEFORE BREAKFAST. 05/30/19  Yes  Wendie Agreste, MD  omeprazole (PRILOSEC) 40 MG capsule Take 1 capsule (40 mg total) by mouth 2 (two) times daily. 05/09/19  Yes Mansouraty, Telford Nab., MD  pravastatin (PRAVACHOL) 40 MG tablet Take 1 tablet (40 mg total) by mouth daily. 07/12/18  Yes Shawnee Knapp, MD  traZODone (DESYREL) 100 MG tablet TAKE 3 TABLETS BY MOUTH DAILY 06/16/19  Yes Hurst, Teresa T, PA-C  Cholecalciferol (VITAMIN D-3) 5000 units TABS Take 5,000 Units daily by mouth. In addition to the rx high dose once weekly vitamin D of 50Ku. Patient not taking: Reported on 07/19/2019 07/24/17   Shawnee Knapp, MD  Coenzyme Q10 (CO Q-10) 300 MG CAPS Take 1 capsule daily by mouth.    [provider]  magnesium oxide (MAG-OX) 400 MG tablet Take 400 mg by mouth daily.    [provider]  Omega-3 Fatty Acids (FISH OIL) 1200 MG CAPS Take by mouth.    [provider]  Vitamin D, Ergocalciferol, (DRISDOL) 1.25 MG (50000 UT) CAPS capsule TAKE 1 CAPSULE (50,000 UNITS TOTAL) BY MOUTH 2 (TWO) TIMES A WEEK. Patient not taking: Reported on 07/19/2019 10/08/18   Shawnee Knapp, MD  vitamin E 600 UNIT capsule Take 600 Units by mouth daily.    [provider]   Social History   Socioeconomic History  . Marital status: Divorced    Spouse name: Not on file  . Number of children: Not on file  . Years of education: Not on file  . Highest education level: Not on file  Occupational History  . Occupation: Geophysical data processor  Social Needs  . Financial resource strain: Not on file  . Food insecurity    Worry: Not on file    Inability: Not on file  . Transportation needs    Medical: Not on file    Non-medical: Not on file  Tobacco Use  . Smoking status: Former Smoker    Packs/day: 1.00    Years: 15.00    Pack years: 15.00    Quit date: 06/30/1979    Years since quitting: 40.0  . Smokeless tobacco: Never Used  Substance and Sexual Activity  . Alcohol use: Yes    Alcohol/week: 0.0 - 1.0 standard drinks  . Drug  use: No  . Sexual activity: Yes    Birth control/protection: None  Lifestyle  . Physical activity    Days per week: Not on file    Minutes per session: Not on file  . Stress: Not on file  Relationships  . Social Herbalist on phone: Not on file    Gets together: Not on file    Attends religious service: Not on file    Active member of club or organization: Not on file    Attends meetings of clubs or organizations: Not on file    Relationship status: Not on file  . Intimate partner violence    Fear of current or ex partner: Not on file    Emotionally abused: Not on file    Physically abused: Not on file    Forced sexual activity: Not on file  Other Topics Concern  . Not on file  Social History Narrative   Divorced. Education: [employer]. Exercise: No.     Observations/Objective: Vital signs obtained at home by patient Vitals:   07/19/19 1418  BP: (!) 170/90  Weight: 179 lb (81.2 kg)  Height: 5\' 4"  (1.626 m)  Speaking normally without distress, no cough, appropriate responses.  Assessment and Plan: Night sweats - Plan: Novel Coronavirus, NAA ([facility])  Chills - Plan: Novel Coronavirus, NAA ([facility])  Elevated blood pressure reading  Essential hypertension  Fatigue, unspecified type  Fatigue has improved.  Adjusting antihypertensives to the evening may have been helpful.  Still suspect component of her other psychiatric medicines contributing.  Will discuss with psychiatry on follow-up soon.   Elevated home blood pressure readings, but not consistent with in office readings.  Possibly false elevations from her meter.  Advised to follow-up in 10 days with her blood pressure meter to double check accuracy then adjust meds if needed.  ER precautions if symptomatic hypertension.   Recent chills, night sweats without other acute infectious symptoms.  Possible sick contacts with clients having cough, currently undergoing COVID-19 testing.  Differential includes  COVID-19, recommended self-isolation, outpatient testing ordered with ER precautions if acute worsening of symptoms.   Follow Up Instructions:  10 days in office with home  meter.   I discussed the assessment and treatment plan with the patient. The patient was provided an opportunity to ask questions and all were answered. The patient agreed with the plan and demonstrated an understanding of the instructions.   The patient was advised to call back or seek an in-person evaluation if the symptoms worsen or if the condition fails to improve as anticipated.  I provided 14 minutes of non-face-to-face time during this encounter.  Signed,   Merri Ray, MD [facility].  07/19/19

## 2019-07-24 ENCOUNTER — Ambulatory Visit: Payer: PPO

## 2019-07-24 ENCOUNTER — Other Ambulatory Visit: Payer: Self-pay

## 2019-07-24 DIAGNOSIS — I10 Essential (primary) hypertension: Secondary | ICD-10-CM | POA: Diagnosis not present

## 2019-07-24 DIAGNOSIS — Z03818 Encounter for observation for suspected exposure to other biological agents ruled out: Secondary | ICD-10-CM | POA: Diagnosis not present

## 2019-08-03 ENCOUNTER — Other Ambulatory Visit: Payer: Self-pay | Admitting: Physician Assistant

## 2019-08-06 ENCOUNTER — Ambulatory Visit: Payer: PPO

## 2019-08-08 ENCOUNTER — Encounter: Payer: Self-pay | Admitting: Emergency Medicine

## 2019-08-08 ENCOUNTER — Other Ambulatory Visit: Payer: Self-pay

## 2019-08-08 ENCOUNTER — Ambulatory Visit (INDEPENDENT_AMBULATORY_CARE_PROVIDER_SITE_OTHER): Payer: PPO | Admitting: Emergency Medicine

## 2019-08-08 VITALS — BP 113/64 | HR 77 | Temp 98.3°F | Resp 16 | Ht 64.5 in | Wt 176.4 lb

## 2019-08-08 DIAGNOSIS — Z7689 Persons encountering health services in other specified circumstances: Secondary | ICD-10-CM

## 2019-08-08 DIAGNOSIS — L723 Sebaceous cyst: Secondary | ICD-10-CM | POA: Diagnosis not present

## 2019-08-08 DIAGNOSIS — Z1231 Encounter for screening mammogram for malignant neoplasm of breast: Secondary | ICD-10-CM

## 2019-08-08 DIAGNOSIS — L0201 Cutaneous abscess of face: Secondary | ICD-10-CM | POA: Diagnosis not present

## 2019-08-08 DIAGNOSIS — L089 Local infection of the skin and subcutaneous tissue, unspecified: Secondary | ICD-10-CM | POA: Diagnosis not present

## 2019-08-08 MED ORDER — DOXYCYCLINE HYCLATE 100 MG PO TABS: 100.0000 mg | ORAL_TABLET | Freq: Two times a day (BID) | ORAL | 0 refills | Status: AC

## 2019-08-08 NOTE — Progress Notes (Signed)
Becky Lawson 68 y.o.   Chief Complaint  Patient presents with  . Mass    ON LEFT JAWBONE with pressure x 5 days         HISTORY OF PRESENT ILLNESS: This is a 68 y.o. female complaining of tender and irritated mass to left jaw area progressively getting worse over the past 5 days.  No other significant symptoms.  HPI   Prior to Admission medications   Medication Sig Start Date End Date Taking? Authorizing Provider  amLODipine (NORVASC) 5 MG tablet Take 1 tablet (5 mg total) by mouth daily. 04/08/19  Yes Wendie Agreste, MD  buPROPion (WELLBUTRIN XL) 150 MG 24 hr tablet TAKE 1 TABLET (150 MG TOTAL) BY MOUTH DAILY. TAKE IN ADDITION TO THE 300 MG TO EQUAL 450 MG/DAY. 05/14/19  Yes Hurst, Teresa T, PA-C  buPROPion (WELLBUTRIN XL) 300 MG 24 hr tablet TAKE 1 TABLET BY MOUTH EVERY DAY 06/27/19  Yes Hurst, Teresa T, PA-C  candesartan (ATACAND) 32 MG tablet Take 1 tablet (32 mg total) by mouth daily. 07/12/18  Yes Shawnee Knapp, MD  carbidopa-levodopa (SINEMET IR) 25-100 MG tablet TAKE 1 TABLET BY MOUTH EVERYDAY AT BEDTIME 08/21/18  Yes Shawnee Knapp, MD  Cholecalciferol (VITAMIN D-3) 5000 units TABS Take 5,000 Units daily by mouth. In addition to the rx high dose once weekly vitamin D of 50Ku. 07/24/17  Yes Shawnee Knapp, MD  Coenzyme Q10 (CO Q-10) 300 MG CAPS Take 1 capsule daily by mouth.   Yes [provider]  docusate sodium (COLACE) 100 MG capsule Take 1 capsule (100 mg total) by mouth 2 (two) times daily. 05/13/17  Yes Shawnee Knapp, MD  HYDROcodone-acetaminophen [facility]) 10-325 MG tablet Take 1 tablet by mouth 3 (three) times daily.    Yes [provider]  hydrOXYzine (ATARAX/VISTARIL) 25 MG tablet TAKE 1-2 TABLETS EVERY 6 HOURS AS NEEDED PER ANXIETY 08/04/19  Yes Hurst, Teresa T, PA-C  lamoTRIgine (LAMICTAL) 200 MG tablet TAKE 1 TABLET BY MOUTH TWICE A DAY 02/05/19  Yes Hurst, Teresa T, PA-C  levothyroxine (SYNTHROID) 100 MCG tablet TAKE 1 TABLET (100 MCG TOTAL) BY MOUTH DAILY BEFORE  BREAKFAST. 05/30/19  Yes Wendie Agreste, MD  magnesium oxide (MAG-OX) 400 MG tablet Take 400 mg by mouth daily.   Yes [provider]  Omega-3 Fatty Acids (FISH OIL) 1200 MG CAPS Take by mouth.   Yes [provider]  omeprazole (PRILOSEC) 40 MG capsule Take 1 capsule (40 mg total) by mouth 2 (two) times daily. 05/09/19  Yes Mansouraty, Telford Nab., MD  pravastatin (PRAVACHOL) 40 MG tablet Take 1 tablet (40 mg total) by mouth daily. 07/12/18  Yes Shawnee Knapp, MD  traZODone (DESYREL) 100 MG tablet TAKE 3 TABLETS BY MOUTH DAILY 06/16/19  Yes Hurst, Teresa T, PA-C  Vitamin D, Ergocalciferol, (DRISDOL) 1.25 MG (50000 UT) CAPS capsule TAKE 1 CAPSULE (50,000 UNITS TOTAL) BY MOUTH 2 (TWO) TIMES A WEEK. 10/08/18  Yes Shawnee Knapp, MD  vitamin E 600 UNIT capsule Take 600 Units by mouth daily.   Yes [provider]  ALPRAZolam Duanne Moron) 1 MG tablet TAKE 1 TABLET AT BEDTIME AND 1 TABLET AS NEEDED 07/19/19   Donnal Moat T, PA-C    Allergies  Allergen Reactions  . No Known Allergies     Patient Active Problem List   Diagnosis Date Noted  . Constipation 05/10/2019  . Change in bowel habit 05/10/2019  . Dysphagia 05/10/2019  . Tremor 07/12/2018  .  Prediabetes 07/12/2018  . Polypharmacy 07/12/2018  . Unintentional weight loss of more than 10 pounds 07/12/2018  . OSA (obstructive sleep apnea) 06/02/2018  . Fatty liver 04/18/2017  . GERD (gastroesophageal reflux disease) 10/28/2016  . 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 Chronic throat clearing 06/24/2016  . S/P partial thyroidectomy 06/16/2016  . Multinodular goiter 06/02/2016  . Thyroid nodule 02/08/2016  . Hx of hepatitis C 05/19/2015  . Vitamin D deficiency 05/19/2015  . Spinal stenosis of lumbar region 05/19/2015  . Hyperlipidemia 05/19/2015  . Bipolar disorder ([place]) 05/19/2015  . Essential hypertension 09/26/2013  . Undiagnosed cardiac murmurs 09/26/2013    Past Medical History:  Diagnosis Date  . Anxiety    . Arthritis   . Benign cyst of right kidney 2014   2.7 cm right kidney cyst seen on imaging 2014 but was c/o right flank pain with new persistent proteinuria (fortunately hematuria had resolved) so repeat US 11/2016 showed right kidney still with 2.6 cm simple cyst and otherwise nml.  . Bipolar 1 disorder ([place])   . Chronic kidney disease   . Depression   . GERD (gastroesophageal reflux disease)   . Heart murmur   . Hepatitis C    resolved completely after treatment in 2014, genotype 1b, [facility] at [facility]  . Hyperlipidemia   . Hypertension   . Jaundice 11/01/2012  . Pruritic disorder 02/13/2013  . Sleep apnea    Was diagnosed approximately 20 years ago, but does not wear CPAP patient stated "I gave it back.Marland KitchenMarland KitchenI couldn't wear that thing it was horrible"  . Spinal stenosis     Past Surgical History:  Procedure Laterality Date  . ABDOMINAL HYSTERECTOMY    . COLONOSCOPY W/ POLYPECTOMY    . JOINT REPLACEMENT Bilateral    knee  . KNEE ARTHROSCOPY Bilateral   . PARTIAL HYSTERECTOMY    . THYROID LOBECTOMY Left 06/16/2016  . THYROID LOBECTOMY Left 06/16/2016   Procedure: LEFT THYROID LOBECTOMY;  Surgeon: Greer Pickerel, MD;  Location: [place];  Service: General;  Laterality: Left;    Social History   Socioeconomic History  . Marital status: Divorced    Spouse name: Not on file  . Number of children: Not on file  . Years of education: Not on file  . Highest education level: Not on file  Occupational History  . Occupation: Geophysical data processor  Social Needs  . Financial resource strain: Not on file  . Food insecurity    Worry: Not on file    Inability: Not on file  . Transportation needs    Medical: Not on file    Non-medical: Not on file  Tobacco Use  . Smoking status: Former Smoker    Packs/day: 1.00    Years: 15.00    Pack years: 15.00    Quit date: 06/30/1979    Years since quitting: 40.1  . Smokeless tobacco: Never Used  Substance and Sexual Activity  . Alcohol use: Yes     Alcohol/week: 0.0 - 1.0 standard drinks  . Drug use: No  . Sexual activity: Yes    Birth control/protection: None  Lifestyle  . Physical activity    Days per week: Not on file    Minutes per session: Not on file  . Stress: Not on file  Relationships  . Social Herbalist on phone: Not on file    Gets together: Not on file    Attends religious service: Not on file    Active member of club or  organization: Not on file    Attends meetings of clubs or organizations: Not on file    Relationship status: Not on file  . Intimate partner violence    Fear of current or ex partner: Not on file    Emotionally abused: Not on file    Physically abused: Not on file    Forced sexual activity: Not on file  Other Topics Concern  . Not on file  Social History Narrative   Divorced. Education: [employer]. Exercise: No.    Family History  Problem Relation Age of Onset  . Diabetes Mother   . Heart disease Mother   . Hyperlipidemia Mother   . Hypertension Mother   . Stroke Mother   . Hypertension Father   . Parkinson's disease Father   . Heart disease Father   . Hyperlipidemia Father   . Hemachromatosis Daughter   . Thyroid disease Neg Hx   . Colon cancer Neg Hx   . Esophageal cancer Neg Hx   . Inflammatory bowel disease Neg Hx   . Liver disease Neg Hx   . Pancreatic cancer Neg Hx   . Rectal cancer Neg Hx   . Stomach cancer Neg Hx      Review of Systems  Constitutional: Negative.  Negative for chills and fever.  HENT: Negative for congestion and sore throat.   Respiratory: Negative.  Negative for cough and shortness of breath.   Cardiovascular: Negative.  Negative for chest pain.  Gastrointestinal: Negative.  Negative for abdominal pain, diarrhea, nausea and vomiting.  Musculoskeletal: Negative for myalgias.  Neurological: Negative for dizziness and headaches.   Vitals:   08/08/19 1119  BP: 113/64  Pulse: 77  Resp: 16  Temp: 98.3 F (36.8 C)  SpO2: 95%      Physical Exam Vitals signs reviewed.  Constitutional:      Appearance: Normal appearance.  HENT:     Head: Normocephalic.  Eyes:     Extraocular Movements: Extraocular movements intact.     Pupils: Pupils are equal, round, and reactive to light.  Neck:     Musculoskeletal: Normal range of motion.  Cardiovascular:     Rate and Rhythm: Normal rate.  Pulmonary:     Effort: Pulmonary effort is normal.  Skin:    General: Skin is warm and dry.     Comments: Left jaw area: Positive erythematous, tender, and fluctuant mass compatible with abscess.  Neurological:     General: No focal deficit present.     Mental Status: She is alert and oriented to person, place, and time.   Incision and drainage: Area cleansed with Betadine.  Lidocaine with epinephrine infiltrated.  Small incision made with #11 disposable blade.  Cystic material and pus drained.  Small amount of packing placed.  Cover with sterile dressing.  Patient tolerated procedure well.  No complications.   ASSESSMENT & PLAN: Merrianne was seen today for mass.  Diagnoses and all orders for this visit:  Infected sebaceous cyst -     doxycycline (VIBRA-TABS) 100 MG tablet; Take 1 tablet (100 mg total) by mouth 2 (two) times daily for 7 days.  Facial abscess -     doxycycline (VIBRA-TABS) 100 MG tablet; Take 1 tablet (100 mg total) by mouth 2 (two) times daily for 7 days.  Encounter for screening mammogram for malignant neoplasm of breast -     MM Digital Screening; Future  Encounter for incision and drainage procedure    Patient Instructions   Remove packing in  2 days.    If you have lab work done today you will be contacted with your lab results within the next 2 weeks.  If you have not heard from [place] then please contact us. The fastest way to get your results is to register for My Chart.   IF you received an x-ray today, you will receive an invoice from [facility]. Please contact [facility] at  [phone] with questions or concerns regarding your invoice.   IF you received labwork today, you will receive an invoice from [facility]. Please contact [facility] at [phone] with questions or concerns regarding your invoice.   Our billing staff will not be able to assist you with questions regarding bills from these companies.  You will be contacted with the lab results as soon as they are available. The fastest way to get your results is to activate your My Chart account. Instructions are located on the last page of this paperwork. If you have not heard from [place] regarding the results in 2 weeks, please contact this office.    We recommend that you schedule a mammogram for breast cancer screening. Typically, you do not need a referral to do this. Please contact a local imaging center to schedule your mammogram.  [facility] - [phone]  *ask for the Radiology Department [facility] ([place]) - [phone] or [phone]  [facility] - [phone] [place] [phone] [facility] - [phone]  *ask for the [facility] - [phone]  *ask for the Radiology Department [facility] - [phone]  *ask for the [place] - [phone] Incision and Drainage, Care After This sheet gives you information about how to care for yourself after your procedure. Your health care provider may also give you more specific instructions. If you have problems or questions, contact your health care provider. What can I expect after the procedure? After the procedure, it is common to have:  Pain or discomfort around the incision site.  Blood, fluid, or pus (drainage) from the incision.  Redness and firm skin around the incision site. Follow these instructions at home: Medicines  Take over-the-counter and prescription medicines only as told  by your health care provider.  If you were prescribed an antibiotic medicine, use or take it as told by your health care provider. Do not stop using the antibiotic even if you start to feel better. Wound care Follow instructions from your health care provider about how to take care of your wound. Make sure you:  Wash your hands with soap and water before and after you change your bandage (dressing). If soap and water are not available, use hand sanitizer.  Change your dressing and packing as told by your health care provider. ? If your dressing is dry or stuck when you try to remove it, moisten or wet the dressing with saline or water so that it can be removed without harming your skin or tissues. ? If your wound is packed, leave it in place until your health care provider tells you to remove it. To remove the packing, moisten or wet the packing with saline or water so that it can be removed without harming your skin or tissues.  Leave stitches (sutures), skin glue, or adhesive strips in place. These skin closures may need to stay in place for 2 weeks or longer. If adhesive strip edges start to loosen and curl  up, you may trim the loose edges. Do not remove adhesive strips completely unless your health care provider tells you to do that. Check your wound every day for signs of infection. Check for:  More redness, swelling, or pain.  More fluid or blood.  Warmth.  Pus or a bad smell. If you were sent home with a drain tube in place, follow instructions from your health care provider about:  How to empty it.  How to care for it at home.  General instructions  Rest the affected area.  Do not take baths, swim, or use a hot tub until your health care provider approves. Ask your health care provider if you may take showers. You may only be allowed to take sponge baths.  Return to your normal activities as told by your health care provider. Ask your health care provider what activities are  safe for you. Your health care provider may put you on activity or lifting restrictions.  The incision will continue to drain. It is normal to have some clear or slightly bloody drainage. The amount of drainage should lessen each day.  Do not apply any creams, ointments, or liquids unless you have been told to by your health care provider.  Keep all follow-up visits as told by your health care provider. This is important. Contact a health care provider if:  Your cyst or abscess returns.  You have a fever or chills.  You have more redness, swelling, or pain around your incision.  You have more fluid or blood coming from your incision.  Your incision feels warm to the touch.  You have pus or a bad smell coming from your incision.  You have red streaks above or below the incision site. Get help right away if:  You have severe pain or bleeding.  You cannot eat or drink without vomiting.  You have decreased urine output.  You become short of breath.  You have chest pain.  You cough up blood.  The affected area becomes numb or starts to tingle. These symptoms may represent a serious problem that is an emergency. Do not wait to see if the symptoms will go away. Get medical help right away. Call your local emergency services (911 in the [place] Do not drive yourself to the hospital. Summary  After this procedure, it is common to have fluid, blood, or pus coming from the surgery site.  Follow all home care instructions. You will be told how to take care of your incision, how to check for infection, and how to take medicines.  If you were prescribed an antibiotic medicine, take it as told by your health care provider. Do not stop taking the antibiotic even if you start to feel better.  Contact a health care provider if you have increased redness, swelling, or pain around your incision. Get help right away if you have chest pain, you vomit, you cough up blood, or you have shortness  of breath.  Keep all follow-up visits as told by your health care provider. This is important. This information is not intended to replace advice given to you by your health care provider. Make sure you discuss any questions you have with your health care provider. Document Released: 11/14/2011 Document Revised: 07/23/2018 Document Reviewed: 07/23/2018 Elsevier Patient Education  2020 Elsevier Inc.      Agustina Caroli, MD [facility]

## 2019-08-08 NOTE — Patient Instructions (Addendum)
Remove packing in 2 days.    If you have lab work done today you will be contacted with your lab results within the next 2 weeks.  If you have not heard from [place] then please contact us. The fastest way to get your results is to register for My Chart.   IF you received an x-ray today, you will receive an invoice from [facility]. Please contact [facility] at [phone] with questions or concerns regarding your invoice.   IF you received labwork today, you will receive an invoice from [facility]. Please contact [facility] at [phone] with questions or concerns regarding your invoice.   Our billing staff will not be able to assist you with questions regarding bills from these companies.  You will be contacted with the lab results as soon as they are available. The fastest way to get your results is to activate your My Chart account. Instructions are located on the last page of this paperwork. If you have not heard from [place] regarding the results in 2 weeks, please contact this office.    We recommend that you schedule a mammogram for breast cancer screening. Typically, you do not need a referral to do this. Please contact a local imaging center to schedule your mammogram.  [facility] - [phone]  *ask for the Radiology Department [facility] ([place]) - [phone] or [phone]  [facility] - [phone] [place] [phone] [facility] - [phone]  *ask for the [facility] - [phone]  *ask for the Radiology Department [facility] - [phone]  *ask for the [place] - [phone] Incision and Drainage, Care After This sheet gives you information about how to care for yourself after your procedure. Your health care provider may also give you more specific instructions. If you have problems or  questions, contact your health care provider. What can I expect after the procedure? After the procedure, it is common to have:  Pain or discomfort around the incision site.  Blood, fluid, or pus (drainage) from the incision.  Redness and firm skin around the incision site. Follow these instructions at home: Medicines  Take over-the-counter and prescription medicines only as told by your health care provider.  If you were prescribed an antibiotic medicine, use or take it as told by your health care provider. Do not stop using the antibiotic even if you start to feel better. Wound care Follow instructions from your health care provider about how to take care of your wound. Make sure you:  Wash your hands with soap and water before and after you change your bandage (dressing). If soap and water are not available, use hand sanitizer.  Change your dressing and packing as told by your health care provider. ? If your dressing is dry or stuck when you try to remove it, moisten or wet the dressing with saline or water so that it can be removed without harming your skin or tissues. ? If your wound is packed, leave it in place until your health care provider tells you to remove it. To remove the packing, moisten or wet the packing with saline or water so that it can be removed without harming your skin or tissues.  Leave stitches (sutures), skin glue, or adhesive strips in place. These skin closures may need to stay in place for 2 weeks or longer. If adhesive strip edges start to loosen  and curl up, you may trim the loose edges. Do not remove adhesive strips completely unless your health care provider tells you to do that. Check your wound every day for signs of infection. Check for:  More redness, swelling, or pain.  More fluid or blood.  Warmth.  Pus or a bad smell. If you were sent home with a drain tube in place, follow instructions from your health care provider about:  How to empty  it.  How to care for it at home.  General instructions  Rest the affected area.  Do not take baths, swim, or use a hot tub until your health care provider approves. Ask your health care provider if you may take showers. You may only be allowed to take sponge baths.  Return to your normal activities as told by your health care provider. Ask your health care provider what activities are safe for you. Your health care provider may put you on activity or lifting restrictions.  The incision will continue to drain. It is normal to have some clear or slightly bloody drainage. The amount of drainage should lessen each day.  Do not apply any creams, ointments, or liquids unless you have been told to by your health care provider.  Keep all follow-up visits as told by your health care provider. This is important. Contact a health care provider if:  Your cyst or abscess returns.  You have a fever or chills.  You have more redness, swelling, or pain around your incision.  You have more fluid or blood coming from your incision.  Your incision feels warm to the touch.  You have pus or a bad smell coming from your incision.  You have red streaks above or below the incision site. Get help right away if:  You have severe pain or bleeding.  You cannot eat or drink without vomiting.  You have decreased urine output.  You become short of breath.  You have chest pain.  You cough up blood.  The affected area becomes numb or starts to tingle. These symptoms may represent a serious problem that is an emergency. Do not wait to see if the symptoms will go away. Get medical help right away. Call your local emergency services (911 in the [place] Do not drive yourself to the hospital. Summary  After this procedure, it is common to have fluid, blood, or pus coming from the surgery site.  Follow all home care instructions. You will be told how to take care of your incision, how to check for  infection, and how to take medicines.  If you were prescribed an antibiotic medicine, take it as told by your health care provider. Do not stop taking the antibiotic even if you start to feel better.  Contact a health care provider if you have increased redness, swelling, or pain around your incision. Get help right away if you have chest pain, you vomit, you cough up blood, or you have shortness of breath.  Keep all follow-up visits as told by your health care provider. This is important. This information is not intended to replace advice given to you by your health care provider. Make sure you discuss any questions you have with your health care provider. Document Released: 11/14/2011 Document Revised: 07/23/2018 Document Reviewed: 07/23/2018 Elsevier Patient Education  2020 [employer].

## 2019-08-20 ENCOUNTER — Other Ambulatory Visit: Payer: Self-pay | Admitting: Physician Assistant

## 2019-09-14 ENCOUNTER — Other Ambulatory Visit: Payer: Self-pay | Admitting: Physician Assistant

## 2019-09-27 DIAGNOSIS — Z79891 Long term (current) use of opiate analgesic: Secondary | ICD-10-CM | POA: Diagnosis not present

## 2019-09-30 ENCOUNTER — Ambulatory Visit (INDEPENDENT_AMBULATORY_CARE_PROVIDER_SITE_OTHER): Payer: PPO | Admitting: Family Medicine

## 2019-09-30 ENCOUNTER — Other Ambulatory Visit: Payer: Self-pay

## 2019-09-30 VITALS — BP 136/82 | HR 80 | Temp 98.3°F | Ht 64.5 in | Wt 186.6 lb

## 2019-09-30 DIAGNOSIS — I10 Essential (primary) hypertension: Secondary | ICD-10-CM

## 2019-09-30 DIAGNOSIS — E890 Postprocedural hypothyroidism: Secondary | ICD-10-CM

## 2019-09-30 DIAGNOSIS — R7989 Other specified abnormal findings of blood chemistry: Secondary | ICD-10-CM | POA: Diagnosis not present

## 2019-09-30 DIAGNOSIS — E785 Hyperlipidemia, unspecified: Secondary | ICD-10-CM | POA: Diagnosis not present

## 2019-09-30 MED ORDER — AMLODIPINE BESYLATE 5 MG PO TABS: 5.0000 mg | ORAL_TABLET | Freq: Every day | ORAL | 1 refills | Status: DC

## 2019-09-30 MED ORDER — LEVOTHYROXINE SODIUM 100 MCG PO TABS: 100.0000 ug | ORAL_TABLET | Freq: Every day | ORAL | 1 refills | Status: DC

## 2019-09-30 MED ORDER — PRAVASTATIN SODIUM 40 MG PO TABS: 40.0000 mg | ORAL_TABLET | Freq: Every day | ORAL | 3 refills | Status: DC

## 2019-09-30 MED ORDER — VITAMIN D (ERGOCALCIFEROL) 1.25 MG (50000 UNIT) PO CAPS: 50000.0000 [IU] | ORAL_CAPSULE | ORAL | 3 refills | Status: DC

## 2019-09-30 NOTE — Progress Notes (Signed)
Subjective:  Patient ID: Becky Lawson, female    DOB: [birth year]  Age: 69 y.o. MRN: [id number]  CC:  Chief Complaint  Patient presents with  . Follow-up    on hypertention and medication refill.pt stats her medications are working well for her but make her feel fatigued. pt thinks it maybe her BP meds making her feels that way. pt dose state that other than that she feels well with no complants.    HPI Becky Lawson presents for   Hypertension: Discussed fatigue and possible polypharmacy at prior visit. Recommended discussing meds with psychiatrist. - appt in 3 days.  Discussed lower dose of amlodipine at last visit. Did not make this change, but stopped candesartan. Feels like fatigue is better off candesartan.  Still on amlodipine 5mg  qd.   Hypothyroidism: Lab Results  Component Value Date   TSH 1.590 09/30/2019  synthroid 165mcg qd.  Able to clean house, more active, fatigue has lessened. Borderline tsh in 04/2019, stable in 05/2019. Rare missed dose   Home readings:  Home readings up to 170. Her meter 157/71 when checked against our meter at 136/82.  BP Readings from Last 3 Encounters:  09/30/19 136/82  08/08/19 113/64  07/19/19 (!) 170/90   Lab Results  Component Value Date   CREATININE 0.78 09/30/2019   Hyperlipidemia: Pravastatin 40mg  Qd, ran out of coq10. No new side effects.  Lab Results  Component Value Date   CHOL 194 09/30/2019   HDL 55 09/30/2019   LDLCALC 122 (H) 09/30/2019   TRIG 96 09/30/2019   CHOLHDL 3.5 09/30/2019   Lab Results  Component Value Date   ALT 12 09/30/2019   AST 18 09/30/2019   ALKPHOS 73 09/30/2019   BILITOT 0.2 09/30/2019   Low vitamin D: Low of 14 in 2016 55,000 units once per week.  Normal on 07/12/18 - 35.6.  No recent testing.    History Patient Active Problem List   Diagnosis Date Noted  . Constipation 05/10/2019  . Change in bowel habit 05/10/2019  . Dysphagia 05/10/2019  . Tremor 07/12/2018  . Prediabetes 07/12/2018   . Polypharmacy 07/12/2018  . Unintentional weight loss of more than 10 pounds 07/12/2018  . OSA (obstructive sleep apnea) 06/02/2018  . Fatty liver 04/18/2017  . GERD (gastroesophageal reflux disease) 10/28/2016  . 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 Chronic throat clearing 06/24/2016  . S/P partial thyroidectomy 06/16/2016  . Multinodular goiter 06/02/2016  . Thyroid nodule 02/08/2016  . Hx of hepatitis C 05/19/2015  . Vitamin D deficiency 05/19/2015  . Spinal stenosis of lumbar region 05/19/2015  . Hyperlipidemia 05/19/2015  . Bipolar disorder ([place]) 05/19/2015  . Essential hypertension 09/26/2013  . Undiagnosed cardiac murmurs 09/26/2013   Past Medical History:  Diagnosis Date  . Anxiety   . Arthritis   . Benign cyst of right kidney 2014   2.7 cm right kidney cyst seen on imaging 2014 but was c/o right flank pain with new persistent proteinuria (fortunately hematuria had resolved) so repeat US 11/2016 showed right kidney still with 2.6 cm simple cyst and otherwise nml.  . Bipolar 1 disorder ([place])   . Chronic kidney disease   . Depression   . GERD (gastroesophageal reflux disease)   . Heart murmur   . Hepatitis C    resolved completely after treatment in 2014, genotype 1b, followed at [facility]  . Hyperlipidemia   . Hypertension   . Jaundice 11/01/2012  . Pruritic disorder 02/13/2013  . Sleep apnea  Was diagnosed approximately 20 years ago, but does not wear CPAP patient stated "I gave it back.Marland KitchenMarland KitchenI couldn't wear that thing it was horrible"  . Spinal stenosis    Past Surgical History:  Procedure Laterality Date  . ABDOMINAL HYSTERECTOMY    . COLONOSCOPY W/ POLYPECTOMY    . JOINT REPLACEMENT Bilateral    knee  . KNEE ARTHROSCOPY Bilateral   . PARTIAL HYSTERECTOMY    . THYROID LOBECTOMY Left 06/16/2016  . THYROID LOBECTOMY Left 06/16/2016   Procedure: LEFT THYROID LOBECTOMY;  Surgeon: Greer Pickerel, MD;  Location: [facility];  Service: General;  Laterality: Left;    Allergies  Allergen Reactions  . No Known Allergies    Prior to Admission medications   Medication Sig Start Date End Date Taking? Authorizing Provider  ALPRAZolam Duanne Moron) 1 MG tablet TAKE 1 TABLET AT BEDTIME AND 1 TABLET AS NEEDED 07/19/19  Yes Hurst, Teresa T, PA-C  amLODipine (NORVASC) 5 MG tablet Take 1 tablet (5 mg total) by mouth daily. 04/08/19  Yes Wendie Agreste, MD  buPROPion (WELLBUTRIN XL) 150 MG 24 hr tablet TAKE 1 TABLET (150 MG TOTAL) BY MOUTH DAILY. TAKE IN ADDITION TO THE 300 MG TO EQUAL 450 MG/DAY. 05/14/19  Yes Hurst, Teresa T, PA-C  buPROPion (WELLBUTRIN XL) 300 MG 24 hr tablet TAKE 1 TABLET BY MOUTH EVERY DAY 06/27/19  Yes Hurst, Teresa T, PA-C  candesartan (ATACAND) 32 MG tablet Take 1 tablet (32 mg total) by mouth daily. 07/12/18  Yes Shawnee Knapp, MD  carbidopa-levodopa (SINEMET IR) 25-100 MG tablet TAKE 1 TABLET BY MOUTH EVERYDAY AT BEDTIME 08/21/18  Yes Shawnee Knapp, MD  Cholecalciferol (VITAMIN D) 125 MCG (5000 UT) CAPS Take by mouth.   Yes [provider]  Cholecalciferol (VITAMIN D-3) 5000 units TABS Take 5,000 Units daily by mouth. In addition to the rx high dose once weekly vitamin D of 50Ku. 07/24/17  Yes Shawnee Knapp, MD  Coenzyme Q10 (CO Q-10) 300 MG CAPS Take 1 capsule daily by mouth.   Yes [provider]  docusate sodium (COLACE) 100 MG capsule Take 1 capsule (100 mg total) by mouth 2 (two) times daily. 05/13/17  Yes Shawnee Knapp, MD  HYDROcodone-acetaminophen [facility]) 10-325 MG tablet Take 1 tablet by mouth 3 (three) times daily.    Yes [provider]  hydrOXYzine (ATARAX/VISTARIL) 25 MG tablet TAKE 1-2 TABLETS EVERY 6 HOURS AS NEEDED PER ANXIETY 08/04/19  Yes Hurst, Teresa T, PA-C  lamoTRIgine (LAMICTAL) 200 MG tablet TAKE 1 TABLET BY MOUTH TWICE A DAY 08/20/19  Yes Hurst, Teresa T, PA-C  levothyroxine (SYNTHROID) 100 MCG tablet TAKE 1 TABLET (100 MCG TOTAL) BY MOUTH DAILY BEFORE BREAKFAST. 05/30/19  Yes Wendie Agreste, MD  magnesium  oxide (MAG-OX) 400 MG tablet Take 400 mg by mouth daily.   Yes [provider]  Omega-3 Fatty Acids (FISH OIL) 1200 MG CAPS Take by mouth.   Yes [provider]  omeprazole (PRILOSEC) 40 MG capsule Take 1 capsule (40 mg total) by mouth 2 (two) times daily. 05/09/19  Yes Mansouraty, Telford Nab., MD  pravastatin (PRAVACHOL) 40 MG tablet Take 1 tablet (40 mg total) by mouth daily. 07/12/18  Yes Shawnee Knapp, MD  traZODone (DESYREL) 100 MG tablet TAKE 3 TABLETS BY MOUTH EVERY DAY 09/15/19  Yes Hurst, Teresa T, PA-C  Vitamin D, Ergocalciferol, (DRISDOL) 1.25 MG (50000 UT) CAPS capsule TAKE 1 CAPSULE (50,000 UNITS TOTAL) BY MOUTH 2 (TWO) TIMES A WEEK. 10/08/18  Yes Shawnee Knapp, MD  vitamin E 600 UNIT capsule Take 600 Units by mouth daily.   Yes [provider]   Social History   Socioeconomic History  . Marital status: Divorced    Spouse name: Not on file  . Number of children: Not on file  . Years of education: Not on file  . Highest education level: Not on file  Occupational History  . Occupation: Geophysical data processor  Tobacco Use  . Smoking status: Former Smoker    Packs/day: 1.00    Years: 15.00    Pack years: 15.00    Quit date: 06/30/1979    Years since quitting: 40.2  . Smokeless tobacco: Never Used  Substance and Sexual Activity  . Alcohol use: Yes    Alcohol/week: 0.0 - 1.0 standard drinks  . Drug use: No  . Sexual activity: Yes    Birth control/protection: None  Other Topics Concern  . Not on file  Social History Narrative   Divorced. Education: [employer]. Exercise: No.   Social Determinants of Health   Financial Resource Strain:   . Difficulty of Paying Living Expenses: Not on file  Food Insecurity:   . Worried About Charity fundraiser in the Last Year: Not on file  . Ran Out of Food in the Last Year: Not on file  Transportation Needs:   . Lack of Transportation (Medical): Not on file  . Lack of Transportation (Non-Medical): Not on file  Physical  Activity:   . Days of Exercise per Week: Not on file  . Minutes of Exercise per Session: Not on file  Stress:   . Feeling of Stress : Not on file  Social Connections:   . Frequency of Communication with Friends and Family: Not on file  . Frequency of Social Gatherings with Friends and Family: Not on file  . Attends Religious Services: Not on file  . Active Member of Clubs or Organizations: Not on file  . Attends Archivist Meetings: Not on file  . Marital Status: Not on file  Intimate Partner Violence:   . Fear of Current or Ex-Partner: Not on file  . Emotionally Abused: Not on file  . Physically Abused: Not on file  . Sexually Abused: Not on file    Review of Systems  Per HPI.  Objective:   Vitals:   09/30/19 1123  BP: 136/82  Pulse: 80  Temp: 98.3 F (36.8 C)  TempSrc: Temporal  SpO2: 96%  Weight: 186 lb 9.6 oz (84.6 kg)  Height: 5' 4.5" (1.638 m)     Physical Exam Vitals reviewed.  Constitutional:      General: She is not in acute distress.    Appearance: Normal appearance. She is well-developed. She is not ill-appearing.  HENT:     Head: Normocephalic and atraumatic.  Eyes:     Conjunctiva/sclera: Conjunctivae normal.     Pupils: Pupils are equal, round, and reactive to light.  Neck:     Vascular: No carotid bruit.     Comments: No mass.  Cardiovascular:     Rate and Rhythm: Normal rate and regular rhythm.     Heart sounds: Normal heart sounds.  Pulmonary:     Effort: Pulmonary effort is normal.     Breath sounds: Normal breath sounds.  Abdominal:     Palpations: Abdomen is soft. There is no pulsatile mass.     Tenderness: There is no abdominal tenderness.  Skin:    General: Skin is warm  and dry.  Neurological:     Mental Status: She is alert and oriented to person, place, and time.  Psychiatric:        Behavior: Behavior normal.        Assessment & Plan:  Urijah Raynor is a 69 y.o. female . Hyperlipidemia, unspecified hyperlipidemia  type - Plan: pravastatin (PRAVACHOL) 40 MG tablet, Comprehensive metabolic panel, Lipid panel  -Check labs, continue same dose pravastatin for now with co-Q10 supplement  Essential hypertension - Plan: amLODipine (NORVASC) 5 MG tablet  -Stable on current dose of amlodipine.  Decided to hold candesartan as her fatigue improved off that med and overall numbers seem stable.  Continue to monitor.  -As discussed previously, I suspect her fatigue is multifactorial, and part due to polypharmacy.  Has upcoming follow-up with psychiatry planned.  Overall fatigue has improved.  Postoperative hypothyroidism - Plan: levothyroxine (SYNTHROID) 100 MCG tablet, TSH  -Continue Synthroid, check TSH  Low serum vitamin D - Plan: Vitamin D, Ergocalciferol, (DRISDOL) 1.25 MG (50000 UNIT) CAPS capsule, Vitamin D, 25-hydroxy  -Previously significant low.  Has been taking the 50,000 units once per week, not twice per week with additional 5000 units.  May be sufficient to just take 5000 units/week, but will check baseline testing for further recommendations.  Meds ordered this encounter  Medications  . pravastatin (PRAVACHOL) 40 MG tablet    Sig: Take 1 tablet (40 mg total) by mouth daily.    Dispense:  90 tablet    Refill:  3  . amLODipine (NORVASC) 5 MG tablet    Sig: Take 1 tablet (5 mg total) by mouth daily.    Dispense:  90 tablet    Refill:  1  . levothyroxine (SYNTHROID) 100 MCG tablet    Sig: Take 1 tablet (100 mcg total) by mouth daily before breakfast.    Dispense:  90 tablet    Refill:  1  . Vitamin D, Ergocalciferol, (DRISDOL) 1.25 MG (50000 UNIT) CAPS capsule    Sig: Take 1 capsule (50,000 Units total) by mouth every 7 (seven) days.    Dispense:  12 capsule    Refill:  3   Patient Instructions   Your meter may not giving you accurate readings. See info on checking numbers at home. If readings start to elevate - can consider restarting low dose candesartan.  Keep follow up with psychiatry to  review meds and discuss fatigue.  Vit D 50,000 units once per week. I will check levels today.     How to Take Your Blood Pressure Blood pressure is a measurement of how strongly your blood is pressing against the walls of your arteries. Arteries are blood vessels that carry blood from your heart throughout your body. Your health care provider takes your blood pressure at each office visit. You can also take your own blood pressure at home with a blood pressure machine. You may need to take your own blood pressure:  To confirm a diagnosis of high blood pressure (hypertension).  To monitor your blood pressure over time.  To make sure your blood pressure medicine is working. Supplies needed: To take your blood pressure, you will need a blood pressure machine. You can buy a blood pressure machine, or blood pressure monitor, at most drugstores or online. There are several types of home blood pressure monitors. When choosing one, consider the following:  Choose a monitor that has an arm cuff.  Choose a cuff that wraps snugly around your upper arm. You should  be able to fit only one finger between your arm and the cuff.  Do not choose a monitor that measures your blood pressure from your wrist or finger. Your health care provider can suggest a reliable monitor that will meet your needs. How to prepare To get the most accurate reading, avoid the following for 30 minutes before you check your blood pressure:  Drinking caffeine.  Drinking alcohol.  Eating.  Smoking.  Exercising. Five minutes before you check your blood pressure:  Empty your bladder.  Sit quietly without talking in a dining chair, rather than in a soft couch or armchair. How to take your blood pressure To check your blood pressure, follow the instructions in the manual that came with your blood pressure monitor. If you have a digital blood pressure monitor, the instructions may be as follows: 1. Sit up  straight. 2. Place your feet on the floor. Do not cross your ankles or legs. 3. Rest your left arm at the level of your heart on a table or desk or on the arm of a chair. 4. Pull up your shirt sleeve. 5. Wrap the blood pressure cuff around the upper part of your left arm, 1 inch (2.5 cm) above your elbow. It is best to wrap the cuff around bare skin. 6. Fit the cuff snugly around your arm. You should be able to place only one finger between the cuff and your arm. 7. Position the cord inside the groove of your elbow. 8. Press the power button. 9. Sit quietly while the cuff inflates and deflates. 10. Read the digital reading on the monitor screen and write it down (record it). 11. Wait 2-3 minutes, then repeat the steps, starting at step 1. What does my blood pressure reading mean? A blood pressure reading consists of a higher number over a lower number. Ideally, your blood pressure should be below 120/80. The first ("top") number is called the systolic pressure. It is a measure of the pressure in your arteries as your heart beats. The second ("bottom") number is called the diastolic pressure. It is a measure of the pressure in your arteries as the heart relaxes. Blood pressure is classified into four stages. The following are the stages for adults who do not have a short-term serious illness or a chronic condition. Systolic pressure and diastolic pressure are measured in a unit called mm Hg. Normal  Systolic pressure: below 494.  Diastolic pressure: below 80. Elevated  Systolic pressure: 496-759.  Diastolic pressure: below 80. Hypertension stage 1  Systolic pressure: 163-846.  Diastolic pressure: 65-99. Hypertension stage 2  Systolic pressure: 357 or above.  Diastolic pressure: 90 or above. You can have prehypertension or hypertension even if only the systolic or only the diastolic number in your reading is higher than normal. Follow these instructions at home:  Check your blood  pressure as often as recommended by your health care provider.  Take your monitor to the next appointment with your health care provider to make sure: ? That you are using it correctly. ? That it provides accurate readings.  Be sure you understand what your goal blood pressure numbers are.  Tell your health care provider if you are having any side effects from blood pressure medicine. Contact a health care provider if:  Your blood pressure is consistently high. Get help right away if:  Your systolic blood pressure is higher than 180.  Your diastolic blood pressure is higher than 110. This information is not intended to replace  advice given to you by your health care provider. Make sure you discuss any questions you have with your health care provider. Document Revised: 08/04/2017 Document Reviewed: 01/29/2016 Elsevier Patient Education  [employer].   If you have lab work done today you will be contacted with your lab results within the next 2 weeks.  If you have not heard from [place] then please contact us. The fastest way to get your results is to register for My Chart.   IF you received an x-ray today, you will receive an invoice from [facility]. Please contact [facility] at [phone] with questions or concerns regarding your invoice.   IF you received labwork today, you will receive an invoice from [place]. Please contact [facility] at [phone] with questions or concerns regarding your invoice.   Our billing staff will not be able to assist you with questions regarding bills from these companies.  You will be contacted with the lab results as soon as they are available. The fastest way to get your results is to activate your My Chart account. Instructions are located on the last page of this paperwork. If you have not heard from [place] regarding the results in 2 weeks, please contact this office.         Signed, Merri Ray, MD [facility]

## 2019-09-30 NOTE — Patient Instructions (Addendum)
Your meter may not giving you accurate readings. See info on checking numbers at home. If readings start to elevate - can consider restarting low dose candesartan.  Keep follow up with psychiatry to review meds and discuss fatigue.  Vit D 50,000 units once per week. I will check levels today.     How to Take Your Blood Pressure Blood pressure is a measurement of how strongly your blood is pressing against the walls of your arteries. Arteries are blood vessels that carry blood from your heart throughout your body. Your health care provider takes your blood pressure at each office visit. You can also take your own blood pressure at home with a blood pressure machine. You may need to take your own blood pressure:  To confirm a diagnosis of high blood pressure (hypertension).  To monitor your blood pressure over time.  To make sure your blood pressure medicine is working. Supplies needed: To take your blood pressure, you will need a blood pressure machine. You can buy a blood pressure machine, or blood pressure monitor, at most drugstores or online. There are several types of home blood pressure monitors. When choosing one, consider the following:  Choose a monitor that has an arm cuff.  Choose a cuff that wraps snugly around your upper arm. You should be able to fit only one finger between your arm and the cuff.  Do not choose a monitor that measures your blood pressure from your wrist or finger. Your health care provider can suggest a reliable monitor that will meet your needs. How to prepare To get the most accurate reading, avoid the following for 30 minutes before you check your blood pressure:  Drinking caffeine.  Drinking alcohol.  Eating.  Smoking.  Exercising. Five minutes before you check your blood pressure:  Empty your bladder.  Sit quietly without talking in a dining chair, rather than in a soft couch or armchair. How to take your blood pressure To check your blood  pressure, follow the instructions in the manual that came with your blood pressure monitor. If you have a digital blood pressure monitor, the instructions may be as follows: 1. Sit up straight. 2. Place your feet on the floor. Do not cross your ankles or legs. 3. Rest your left arm at the level of your heart on a table or desk or on the arm of a chair. 4. Pull up your shirt sleeve. 5. Wrap the blood pressure cuff around the upper part of your left arm, 1 inch (2.5 cm) above your elbow. It is best to wrap the cuff around bare skin. 6. Fit the cuff snugly around your arm. You should be able to place only one finger between the cuff and your arm. 7. Position the cord inside the groove of your elbow. 8. Press the power button. 9. Sit quietly while the cuff inflates and deflates. 10. Read the digital reading on the monitor screen and write it down (record it). 11. Wait 2-3 minutes, then repeat the steps, starting at step 1. What does my blood pressure reading mean? A blood pressure reading consists of a higher number over a lower number. Ideally, your blood pressure should be below 120/80. The first ("top") number is called the systolic pressure. It is a measure of the pressure in your arteries as your heart beats. The second ("bottom") number is called the diastolic pressure. It is a measure of the pressure in your arteries as the heart relaxes. Blood pressure is classified into four  stages. The following are the stages for adults who do not have a short-term serious illness or a chronic condition. Systolic pressure and diastolic pressure are measured in a unit called mm Hg. Normal  Systolic pressure: below 017.  Diastolic pressure: below 80. Elevated  Systolic pressure: 793-903.  Diastolic pressure: below 80. Hypertension stage 1  Systolic pressure: 009-233.  Diastolic pressure: 00-76. Hypertension stage 2  Systolic pressure: 226 or above.  Diastolic pressure: 90 or above. You can  have prehypertension or hypertension even if only the systolic or only the diastolic number in your reading is higher than normal. Follow these instructions at home:  Check your blood pressure as often as recommended by your health care provider.  Take your monitor to the next appointment with your health care provider to make sure: ? That you are using it correctly. ? That it provides accurate readings.  Be sure you understand what your goal blood pressure numbers are.  Tell your health care provider if you are having any side effects from blood pressure medicine. Contact a health care provider if:  Your blood pressure is consistently high. Get help right away if:  Your systolic blood pressure is higher than 180.  Your diastolic blood pressure is higher than 110. This information is not intended to replace advice given to you by your health care provider. Make sure you discuss any questions you have with your health care provider. Document Revised: 08/04/2017 Document Reviewed: 01/29/2016 Elsevier Patient Education  [employer].   If you have lab work done today you will be contacted with your lab results within the next 2 weeks.  If you have not heard from [place] then please contact us. The fastest way to get your results is to register for My Chart.   IF you received an x-ray today, you will receive an invoice from [facility]. Please contact [facility] at [phone] with questions or concerns regarding your invoice.   IF you received labwork today, you will receive an invoice from [place]. Please contact [facility] at [phone] with questions or concerns regarding your invoice.   Our billing staff will not be able to assist you with questions regarding bills from these companies.  You will be contacted with the lab results as soon as they are available. The fastest way to get your results is to activate your My Chart account. Instructions are located on  the last page of this paperwork. If you have not heard from [place] regarding the results in 2 weeks, please contact this office.

## 2019-10-01 ENCOUNTER — Encounter: Payer: Self-pay | Admitting: Family Medicine

## 2019-10-01 LAB — COMPREHENSIVE METABOLIC PANEL
ALT: 12 IU/L (ref 0–32)
AST: 18 IU/L (ref 0–40)
Albumin/Globulin Ratio: 2.3 — ABNORMAL HIGH (ref 1.2–2.2)
Albumin: 4.3 g/dL (ref 3.8–4.8)
Alkaline Phosphatase: 73 IU/L (ref 39–117)
BUN/Creatinine Ratio: 19 (ref 12–28)
BUN: 15 mg/dL (ref 8–27)
Bilirubin Total: 0.2 mg/dL (ref 0.0–1.2)
CO2: 24 mmol/L (ref 20–29)
Calcium: 8.9 mg/dL (ref 8.7–10.3)
Chloride: 106 mmol/L (ref 96–106)
Creatinine, Ser: 0.78 mg/dL (ref 0.57–1.00)
GFR calc Af Amer: 90 mL/min/{1.73_m2} (ref >59)
GFR calc non Af Amer: 78 mL/min/{1.73_m2} (ref >59)
Globulin, Total: 1.9 g/dL (ref 1.5–4.5)
Glucose: 105 mg/dL — ABNORMAL HIGH (ref 65–99)
Potassium: 4.3 mmol/L (ref 3.5–5.2)
Sodium: 139 mmol/L (ref 134–144)
Total Protein: 6.2 g/dL (ref 6.0–8.5)

## 2019-10-01 LAB — LIPID PANEL
Chol/HDL Ratio: 3.5 ratio (ref 0.0–4.4)
Cholesterol, Total: 194 mg/dL (ref 100–199)
HDL: 55 mg/dL (ref >39)
LDL Chol Calc (NIH): 122 mg/dL — ABNORMAL HIGH (ref 0–99)
Triglycerides: 96 mg/dL (ref 0–149)
VLDL Cholesterol Cal: 17 mg/dL (ref 5–40)

## 2019-10-01 LAB — VITAMIN D 25 HYDROXY (VIT D DEFICIENCY, FRACTURES): Vit D, 25-Hydroxy: 26 ng/mL — ABNORMAL LOW (ref 30.0–100.0)

## 2019-10-01 LAB — TSH: TSH: 1.59 u[IU]/mL (ref 0.450–4.500)

## 2019-10-03 ENCOUNTER — Ambulatory Visit: Payer: PPO

## 2019-10-07 ENCOUNTER — Ambulatory Visit: Payer: PPO | Admitting: Physician Assistant

## 2019-10-16 ENCOUNTER — Other Ambulatory Visit: Payer: Self-pay | Admitting: Physician Assistant

## 2019-10-17 NOTE — Telephone Encounter (Signed)
Apt 02/19 Last fill 07/19/2019

## 2019-10-25 ENCOUNTER — Encounter: Payer: Self-pay | Admitting: Physician Assistant

## 2019-10-25 ENCOUNTER — Other Ambulatory Visit: Payer: Self-pay

## 2019-10-25 ENCOUNTER — Ambulatory Visit (INDEPENDENT_AMBULATORY_CARE_PROVIDER_SITE_OTHER): Payer: PPO | Admitting: Physician Assistant

## 2019-10-25 DIAGNOSIS — F3175 Bipolar disorder, in partial remission, most recent episode depressed: Secondary | ICD-10-CM

## 2019-10-25 DIAGNOSIS — F5105 Insomnia due to other mental disorder: Secondary | ICD-10-CM | POA: Diagnosis not present

## 2019-10-25 DIAGNOSIS — F99 Mental disorder, not otherwise specified: Secondary | ICD-10-CM

## 2019-10-25 DIAGNOSIS — F411 Generalized anxiety disorder: Secondary | ICD-10-CM | POA: Diagnosis not present

## 2019-10-25 MED ORDER — DULOXETINE HCL 60 MG PO CPEP: 60.0000 mg | ORAL_CAPSULE | Freq: Every day | ORAL | 1 refills | Status: DC

## 2019-10-25 MED ORDER — ALPRAZOLAM 1 MG PO TABS: 1.0000 mg | ORAL_TABLET | Freq: Two times a day (BID) | ORAL | 1 refills | Status: DC | PRN

## 2019-10-25 MED ORDER — BUPROPION HCL ER (XL) 300 MG PO TB24: 300.0000 mg | ORAL_TABLET | Freq: Every day | ORAL | 1 refills | Status: DC

## 2019-10-25 MED ORDER — BUPROPION HCL ER (XL) 150 MG PO TB24: 150.0000 mg | ORAL_TABLET | Freq: Every day | ORAL | 1 refills | Status: DC

## 2019-10-25 NOTE — Progress Notes (Signed)
[facility]  Patient ID: Becky Lawson,  MRN: [id number]  PCP: Wendie Agreste, MD  Date of Evaluation: 10/25/2019 Time spent:25 minutes  Chief Complaint:  Chief Complaint    Depression; Anxiety; Medication Refill      HISTORY/CURRENT STATUS: HPI For routine med check.  Since her last visit, she has had more depression.  States she does not have a lot of energy and when she is at home, she sits on the couch and does nothing except watch TV.  She does not even enjoy watching television shows that she used to like.  She does work 2 nights a week, sitting with an elderly lady.  She is not missing any work.  Does not cry easily.  Memory is good for her.  She is able to focus on things.  Denies suicidal or homicidal thoughts.  At her last visit, I had prescribed Cymbalta.  She did not take it right away and only started it about 3 weeks ago.  States it is not really helping at all.  Anxiety is well controlled with the Xanax.  On the days that she works, she does need an extra Xanax so that she can.  She works at night so falling asleep in the day can be difficult.  Patient denies increased energy with decreased need for sleep, no increased talkativeness, no racing thoughts, no impulsivity or risky behaviors, no increased spending, no increased libido, no grandiosity.  She still has chronic pain in her back.  She uses hydrocodone which helps that.  Denies dizziness, syncope, seizures, numbness, tingling, tremor, tics, unsteady gait, slurred speech, confusion.   Individual Medical History/ Review of Systems: Changes? :No    Past medications for mental health diagnoses include: Lithium, Seroquel, Effexor, Prozac, Latuda, Abilify,   Allergies: No known allergies  Current Medications:  Current Outpatient Medications:  .  ALPRAZolam (XANAX) 1 MG tablet, Take 1 tablet (1 mg total) by mouth 2 (two) times daily as needed for anxiety., Disp: 130 tablet, Rfl: 1 .  amLODipine  (NORVASC) 5 MG tablet, Take 1 tablet (5 mg total) by mouth daily., Disp: 90 tablet, Rfl: 1 .  buPROPion (WELLBUTRIN XL) 150 MG 24 hr tablet, Take 1 tablet (150 mg total) by mouth daily. Take in addition to the 300 mg to equal 450 mg/day., Disp: 90 tablet, Rfl: 1 .  buPROPion (WELLBUTRIN XL) 300 MG 24 hr tablet, Take 1 tablet (300 mg total) by mouth daily., Disp: 90 tablet, Rfl: 1 .  Cholecalciferol (VITAMIN D) 125 MCG (5000 UT) CAPS, Take by mouth., Disp: , Rfl:  .  Cholecalciferol (VITAMIN D-3) 5000 units TABS, Take 5,000 Units daily by mouth. In addition to the rx high dose once weekly vitamin D of 50Ku., Disp: 30 tablet, Rfl:  .  docusate sodium (COLACE) 100 MG capsule, Take 1 capsule (100 mg total) by mouth 2 (two) times daily., Disp: 10 capsule, Rfl: 0 .  HYDROcodone-acetaminophen (NORCO) 10-325 MG tablet, Take 1 tablet by mouth 3 (three) times daily. , Disp: , Rfl:  .  hydrOXYzine (ATARAX/VISTARIL) 25 MG tablet, TAKE 1-2 TABLETS EVERY 6 HOURS AS NEEDED PER ANXIETY, Disp: 720 tablet, Rfl: 0 .  lamoTRIgine (LAMICTAL) 200 MG tablet, TAKE 1 TABLET BY MOUTH TWICE A DAY, Disp: 180 tablet, Rfl: 0 .  levothyroxine (SYNTHROID) 100 MCG tablet, Take 1 tablet (100 mcg total) by mouth daily before breakfast., Disp: 90 tablet, Rfl: 1 .  omeprazole (PRILOSEC) 40 MG capsule, Take 1 capsule (40 mg total)  by mouth 2 (two) times daily., Disp: 60 capsule, Rfl: 3 .  pravastatin (PRAVACHOL) 40 MG tablet, Take 1 tablet (40 mg total) by mouth daily., Disp: 90 tablet, Rfl: 3 .  traZODone (DESYREL) 100 MG tablet, TAKE 3 TABLETS BY MOUTH EVERY DAY, Disp: 270 tablet, Rfl: 0 .  carbidopa-levodopa (SINEMET IR) 25-100 MG tablet, TAKE 1 TABLET BY MOUTH EVERYDAY AT BEDTIME (Patient not taking: Reported on 10/25/2019), Disp: 90 tablet, Rfl: 1 .  Coenzyme Q10 (CO Q-10) 300 MG CAPS, Take 1 capsule daily by mouth., Disp: , Rfl:  .  DULoxetine (CYMBALTA) 60 MG capsule, Take 1 capsule (60 mg total) by mouth daily., Disp: 30 capsule,  Rfl: 1 .  magnesium oxide (MAG-OX) 400 MG tablet, Take 400 mg by mouth daily., Disp: , Rfl:  .  Omega-3 Fatty Acids (FISH OIL) 1200 MG CAPS, Take by mouth., Disp: , Rfl:  .  Vitamin D, Ergocalciferol, (DRISDOL) 1.25 MG (50000 UNIT) CAPS capsule, Take 1 capsule (50,000 Units total) by mouth every 7 (seven) days. (Patient not taking: Reported on 10/25/2019), Disp: 12 capsule, Rfl: 3 .  vitamin E 600 UNIT capsule, Take 600 Units by mouth daily., Disp: , Rfl:   Current Facility-Administered Medications:  .  cyanocobalamin ((VITAMIN B-12)) injection 1,000 mcg, 1,000 mcg, Intramuscular, Q30 days, Shawnee Knapp, MD, 1,000 mcg at 07/12/18 1652 Medication Side Effects: none  Family Medical/ Social History: Changes? 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   MENTAL HEALTH EXAM:  There were no vitals taken for this visit.There is no height or weight on file to calculate BMI.  General Appearance: Casual, Well Groomed and Obese  Eye Contact:  Good  Speech:  Clear and Coherent  Volume:  Normal  Mood:  Euthymic  Affect:  Appropriate  Thought Process:  Goal Directed  Orientation:  Full (Time, Place, and Person)  Thought Content: Logical   Suicidal Thoughts:  No  Homicidal Thoughts:  No  Memory:  WNL  Judgement:  Good  Insight:  Good  Psychomotor Activity:  Normal  Concentration:  Concentration: Good  Recall:  Good  Fund of Knowledge: Good  Language: Good  Assets:  Desire for Improvement  ADL's:  Intact  Cognition: WNL  Prognosis:  Good    DIAGNOSES:    ICD-10-CM   1. Bipolar disorder, in partial remission, most recent episode depressed ([place])  F31.75   2. Generalized anxiety disorder  F41.1   3. Insomnia due to other mental disorder  F51.05    F99     Receiving Psychotherapy: No    RECOMMENDATIONS:  PDMP was reviewed. I spent 25 minutes with her. I recommend that we increase the Cymbalta, not give up on it.  I purposely started her  on a low dose.  This will help with the anxiety and depression and hopefully with her chronic pain as well. Increase Cymbalta to 60 mg p.o. daily Continue trazodone 100 mg, 1 to 3 pills nightly as needed. Continue Lamictal 200 mg, 1 p.o. twice daily. Continue hydroxyzine 25 mg, 1-2 every 6 hours as needed anxiety. Continue Wellbutrin XL 300 mg +150 mg daily. Continue Xanax 1 mg, 1 p.o. twice daily as needed. Consider therapy. Return in 4 to 6 weeks.  Donnal Moat, PA-C

## 2019-11-13 ENCOUNTER — Ambulatory Visit: Payer: PPO

## 2019-11-13 ENCOUNTER — Other Ambulatory Visit: Payer: Self-pay | Admitting: Physician Assistant

## 2019-11-14 ENCOUNTER — Ambulatory Visit
Admission: RE | Admit: 2019-11-14 | Discharge: 2019-11-14 | Disposition: A | Discharge status: Home / Self Care | Payer: PPO | Source: Ambulatory Visit | Attending: Emergency Medicine | Admitting: Emergency Medicine

## 2019-11-14 ENCOUNTER — Other Ambulatory Visit: Payer: Self-pay

## 2019-11-14 ENCOUNTER — Ambulatory Visit: Payer: PPO

## 2019-11-14 DIAGNOSIS — Z1231 Encounter for screening mammogram for malignant neoplasm of breast: Secondary | ICD-10-CM | POA: Diagnosis not present

## 2019-11-16 ENCOUNTER — Encounter: Payer: Self-pay | Admitting: Emergency Medicine

## 2019-11-19 ENCOUNTER — Other Ambulatory Visit: Payer: Self-pay | Admitting: Physician Assistant

## 2019-11-22 ENCOUNTER — Ambulatory Visit (INDEPENDENT_AMBULATORY_CARE_PROVIDER_SITE_OTHER): Payer: PPO | Admitting: Physician Assistant

## 2019-11-22 ENCOUNTER — Encounter: Payer: Self-pay | Admitting: Physician Assistant

## 2019-11-22 DIAGNOSIS — F411 Generalized anxiety disorder: Secondary | ICD-10-CM | POA: Diagnosis not present

## 2019-11-22 DIAGNOSIS — F5105 Insomnia due to other mental disorder: Secondary | ICD-10-CM

## 2019-11-22 DIAGNOSIS — F99 Mental disorder, not otherwise specified: Secondary | ICD-10-CM | POA: Diagnosis not present

## 2019-11-22 DIAGNOSIS — F3175 Bipolar disorder, in partial remission, most recent episode depressed: Secondary | ICD-10-CM

## 2019-11-22 NOTE — Progress Notes (Signed)
[facility]  Patient ID: Becky Lawson,  MRN: [id number]  PCP: Wendie Agreste, MD  Date of Evaluation: 11/22/2019 Time spent:20 minutes  Chief Complaint:  Chief Complaint    Anxiety; Depression     Virtual Visit via Telephone Note  I connected with patient by a video enabled telemedicine application or telephone, with their informed consent, and verified patient privacy and that I am speaking with the correct person using two identifiers.  I am private, in my office and the patient is home.  I discussed the limitations, risks, security and privacy concerns of performing an evaluation and management service by telephone and the availability of in person appointments. I also discussed with the patient that there may be a patient responsible charge related to this service. The patient expressed understanding and agreed to proceed.   I discussed the assessment and treatment plan with the patient. The patient was provided an opportunity to ask questions and all were answered. The patient agreed with the plan and demonstrated an understanding of the instructions.   The patient was advised to call back or seek an in-person evaluation if the symptoms worsen or if the condition fails to improve as anticipated.  I provided 20 minutes of non-face-to-face time during this encounter.  HISTORY/CURRENT STATUS: HPI For routine f/u.   At the last visit we increased the Cymbalta.  She has been on the 60 mg for 1 month now and states she is a lot better.  She is able to enjoy things more and has more energy and motivation.  She also states that she is not having as much physical pain and is not needing the hydrocodone quite as often.  She does still take at least 2 a day but it is better than it had been.  Sleeps good most of the time.  Denies suicidal or homicidal thoughts.  The anxiety is still a problem sometimes.  She tries the hydroxyzine first and that will often help.  But when it  does not or if the anxiety is really bad she will go ahead and take a Xanax.  The hydroxyzine is less sedating for her.  She is not taking the carbidopa levodopa at present.  States the tremors are getting worse.  She has seen Dr. [name], neurologist in the past who prescribed that.  Patient denies increased energy with decreased need for sleep, no increased talkativeness, no racing thoughts, no impulsivity or risky behaviors, no increased spending, no increased libido, no grandiosity.  Denies dizziness, syncope, seizures, numbness, tingling, tics, unsteady gait, slurred speech, confusion. Denies muscle or joint pain, stiffness, or dystonia.  Individual Medical History/ Review of Systems: Changes? :No   Allergies: No known allergies  Current Medications:  Current Outpatient Medications:  .  ALPRAZolam (XANAX) 1 MG tablet, Take 1 tablet (1 mg total) by mouth 2 (two) times daily as needed for anxiety., Disp: 130 tablet, Rfl: 1 .  amLODipine (NORVASC) 5 MG tablet, Take 1 tablet (5 mg total) by mouth daily., Disp: 90 tablet, Rfl: 1 .  buPROPion (WELLBUTRIN XL) 150 MG 24 hr tablet, Take 1 tablet (150 mg total) by mouth daily. Take in addition to the 300 mg to equal 450 mg/day., Disp: 90 tablet, Rfl: 1 .  buPROPion (WELLBUTRIN XL) 300 MG 24 hr tablet, Take 1 tablet (300 mg total) by mouth daily., Disp: 90 tablet, Rfl: 1 .  Cholecalciferol (VITAMIN D) 125 MCG (5000 UT) CAPS, Take by mouth., Disp: , Rfl:  .  Cholecalciferol (VITAMIN D-3) 5000 units TABS, Take 5,000 Units daily by mouth. In addition to the rx high dose once weekly vitamin D of 50Ku. (Patient taking differently: Take 2,000 Units by mouth daily. In addition to the rx high dose once weekly vitamin D of 50Ku.), Disp: 30 tablet, Rfl:  .  docusate sodium (COLACE) 100 MG capsule, Take 1 capsule (100 mg total) by mouth 2 (two) times daily., Disp: 10 capsule, Rfl: 0 .  DULoxetine (CYMBALTA) 60 MG capsule, TAKE 1 CAPSULE BY MOUTH EVERY DAY,  Disp: 90 capsule, Rfl: 0 .  HYDROcodone-acetaminophen (NORCO) 10-325 MG tablet, Take 1 tablet by mouth 3 (three) times daily. , Disp: , Rfl:  .  hydrOXYzine (ATARAX/VISTARIL) 25 MG tablet, TAKE 1-2 TABLETS EVERY 6 HOURS AS NEEDED PER ANXIETY, Disp: 720 tablet, Rfl: 0 .  lamoTRIgine (LAMICTAL) 200 MG tablet, TAKE 1 TABLET BY MOUTH TWICE A DAY, Disp: 180 tablet, Rfl: 0 .  levothyroxine (SYNTHROID) 100 MCG tablet, Take 1 tablet (100 mcg total) by mouth daily before breakfast., Disp: 90 tablet, Rfl: 1 .  magnesium oxide (MAG-OX) 400 MG tablet, Take 400 mg by mouth daily., Disp: , Rfl:  .  omeprazole (PRILOSEC) 40 MG capsule, Take 1 capsule (40 mg total) by mouth 2 (two) times daily., Disp: 60 capsule, Rfl: 3 .  pravastatin (PRAVACHOL) 40 MG tablet, Take 1 tablet (40 mg total) by mouth daily., Disp: 90 tablet, Rfl: 3 .  traZODone (DESYREL) 100 MG tablet, TAKE 3 TABLETS BY MOUTH EVERY DAY, Disp: 270 tablet, Rfl: 0 .  Vitamin D, Ergocalciferol, (DRISDOL) 1.25 MG (50000 UNIT) CAPS capsule, Take 1 capsule (50,000 Units total) by mouth every 7 (seven) days., Disp: 12 capsule, Rfl: 3 .  carbidopa-levodopa (SINEMET IR) 25-100 MG tablet, TAKE 1 TABLET BY MOUTH EVERYDAY AT BEDTIME (Patient not taking: Reported on 10/25/2019), Disp: 90 tablet, Rfl: 1 .  Coenzyme Q10 (CO Q-10) 300 MG CAPS, Take 1 capsule daily by mouth., Disp: , Rfl:  .  Omega-3 Fatty Acids (FISH OIL) 1200 MG CAPS, Take by mouth., Disp: , Rfl:  .  vitamin E 600 UNIT capsule, Take 600 Units by mouth daily., Disp: , Rfl:   Current Facility-Administered Medications:  .  cyanocobalamin ((VITAMIN B-12)) injection 1,000 mcg, 1,000 mcg, Intramuscular, Q30 days, Shawnee Knapp, MD, 1,000 mcg at 07/12/18 1652 Medication Side Effects: none  Family Medical/ Social History: Changes? No  MENTAL HEALTH EXAM:  There were no vitals taken for this visit.There is no height or weight on file to calculate BMI.  General Appearance: Unable to assess  Eye Contact:   Unable to assess  Speech:  Clear and Coherent and Normal Rate  Volume:  Normal  Mood:  Euthymic  Affect:  Unable to assess  Thought Process:  Goal Directed and Descriptions of Associations: Intact  Orientation:  Full (Time, Place, and Person)  Thought Content: Logical   Suicidal Thoughts:  No  Homicidal Thoughts:  No  Memory:  WNL  Judgement:  Good  Insight:  Good  Psychomotor Activity:  Unable to assess  Concentration:  Concentration: Good  Recall:  Good  Fund of Knowledge: Good  Language: Good  Assets:  Desire for Improvement  ADL's:  Intact  Cognition: WNL  Prognosis:  Good    DIAGNOSES:    ICD-10-CM   1. Bipolar disorder, in partial remission, most recent episode depressed ([place])  F31.75   2. Generalized anxiety disorder  F41.1   3. Insomnia due to other mental disorder  F51.05    F99     Receiving Psychotherapy: No    RECOMMENDATIONS:  PDMP was reviewed. I spent 20 minutes with her. I am glad to hear she is doing so much better!  Our goal for her is to get to 50% or hopefully 75% improvement.  We discussed increasing the Cymbalta or leaving it at the same dose.  We agreed to leave it at the current dose for at least a couple of more weeks.  If she has not improved even more with energy or motivation, and any other depressive symptoms that she was experiencing, then she can call and I will increase the Cymbalta to 90 mg.  She verbalizes understanding. Continue Xanax 1 mg, 1 p.o. twice daily as needed. Continue Wellbutrin XL 150 mg +300 mg. Continue Cymbalta 60 mg 1 p.o. daily. Continue hydroxyzine 25 mg, 1-2 every 6 hours as needed anxiety. Continue Lamictal 200 mg, 1 p.o. twice daily. Continue trazodone 100 mg, 3 p.o. nightly. I recommend she make an appointment with Dr. Rexene Alberts to discuss the tremors and whether she needs to get back on the carbidopa levodopa again.  Patient states she will do that. Return in 3 months.  Donnal Moat, PA-C

## 2019-12-11 DIAGNOSIS — M5416 Radiculopathy, lumbar region: Secondary | ICD-10-CM | POA: Diagnosis not present

## 2019-12-11 DIAGNOSIS — M545 Low back pain: Secondary | ICD-10-CM | POA: Diagnosis not present

## 2019-12-11 DIAGNOSIS — Z79891 Long term (current) use of opiate analgesic: Secondary | ICD-10-CM | POA: Diagnosis not present

## 2019-12-23 ENCOUNTER — Other Ambulatory Visit: Payer: Self-pay | Admitting: Physician Assistant

## 2019-12-24 ENCOUNTER — Other Ambulatory Visit: Payer: Self-pay | Admitting: Gastroenterology

## 2020-01-13 ENCOUNTER — Encounter: Payer: Self-pay | Admitting: Family Medicine

## 2020-01-13 ENCOUNTER — Other Ambulatory Visit: Payer: Self-pay

## 2020-01-13 ENCOUNTER — Telehealth (INDEPENDENT_AMBULATORY_CARE_PROVIDER_SITE_OTHER): Payer: PPO | Admitting: Family Medicine

## 2020-01-13 DIAGNOSIS — R197 Diarrhea, unspecified: Secondary | ICD-10-CM

## 2020-01-13 NOTE — Progress Notes (Signed)
Virtual Visit via audio note  I connected with Becky Lawson on 01/13/20 at 5:54 PM by audio - patient declined video.  and verified that I am speaking with the correct person using two identifiers.   I discussed the limitations, risks, security and privacy concerns of performing an evaluation and management service by telephone and the availability of in person appointments. I also discussed with the patient that there may be a patient responsible charge related to this service. The patient expressed understanding and agreed to proceed, consent obtained  Chief complaint:  Chief Complaint  Patient presents with  . Diarrhea    in the last 24  hrs  . Nausea  . Heartburn  . Gas  . Dehydration    has not eaten or drank anything in 24 hr  . Fatigue    weak since she has not had anything to eat  . Depression    PHQ9=15     History of Present Illness: Becky Lawson is a 69 y.o. female  Started with diarrhea for 12 hours last Thursday - 4 days ago. No blood. No vomiting, just nausea. No fever. Improved Friday and Saturday. Returned yesterday with increased gas, belching. Nausea, then bad headache (HA now resolved). Diarrhea started back at 3pm yesterday - constant diarrhea yesterday and overnight - over 20 episodes. No vomiting, just nausea. Few sips of water at times past day. Feels like goes right through her. Abdominal pain overnight - less now. No abd pain now.  Feels very weak  - no syncope. Trouble walking distances due to fatigue.  Has been able to sip on fluids past 30 mins. Water only.  No fever, chills only last night.  Feels like calming down.   No recent antibiotics or hospitalizations recently. Granddaughter had similar issue 2 weeks ago.  History of Bipolar  - managed by psychiatry - no new symptoms.   Has completed covid vaccine - 1 month ago.   Depression screen [facility] 2/9 01/13/2020 09/30/2019 08/08/2019 07/19/2019 05/23/2019  Decreased Interest 0 3 3 3  0  Down, Depressed,  Hopeless 3 3 2 3  0  PHQ - 2 Score 3 6 5 6  0  Altered sleeping 0 0 0 0 0  Tired, decreased energy 3 3 3 3 1   Change in appetite 3 3 0 0 0  Feeling bad or failure about yourself  3 3 3 3  0  Trouble concentrating 0 1 0 0 0  Moving slowly or fidgety/restless 3 0 0 0 0  Suicidal thoughts 0 0 0 0 0  PHQ-9 Score 15 16 11 12 1   Difficult doing work/chores Very difficult - - Not difficult at all -  Some recent data might be hidden     Patient Active Problem List   Diagnosis Date Noted  . Constipation 05/10/2019  . Change in bowel habit 05/10/2019  . Dysphagia 05/10/2019  . Tremor 07/12/2018  . Prediabetes 07/12/2018  . Polypharmacy 07/12/2018  . Unintentional weight loss of more than 10 pounds 07/12/2018  . OSA (obstructive sleep apnea) 06/02/2018  . Fatty liver 04/18/2017  . GERD (gastroesophageal reflux disease) 10/28/2016  . 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 Chronic throat clearing 06/24/2016  . S/P partial thyroidectomy 06/16/2016  . Multinodular goiter 06/02/2016  . Thyroid nodule 02/08/2016  . Hx of hepatitis C 05/19/2015  . Vitamin D deficiency 05/19/2015  . Spinal stenosis of lumbar region 05/19/2015  . Hyperlipidemia 05/19/2015  . Bipolar disorder ([place]) 05/19/2015  . Essential hypertension 09/26/2013  .  Undiagnosed cardiac murmurs 09/26/2013   Past Medical History:  Diagnosis Date  . Anxiety   . Arthritis   . Benign cyst of right kidney 2014   2.7 cm right kidney cyst seen on imaging 2014 but was c/o right flank pain with new persistent proteinuria (fortunately hematuria had resolved) so repeat US 11/2016 showed right kidney still with 2.6 cm simple cyst and otherwise nml.  . Bipolar 1 disorder ([place])   . Chronic kidney disease   . Depression   . GERD (gastroesophageal reflux disease)   . Heart murmur   . Hepatitis C    resolved completely after treatment in 2014, genotype 1b, [facility]  . Hyperlipidemia   . Hypertension   . Jaundice 11/01/2012  .  Pruritic disorder 02/13/2013  . Sleep apnea    Was diagnosed approximately 20 years ago, but does not wear CPAP patient stated "I gave it back.Marland KitchenMarland KitchenI couldn't wear that thing it was horrible"  . Spinal stenosis    Past Surgical History:  Procedure Laterality Date  . ABDOMINAL HYSTERECTOMY    . COLONOSCOPY W/ POLYPECTOMY    . JOINT REPLACEMENT Bilateral    knee  . KNEE ARTHROSCOPY Bilateral   . PARTIAL HYSTERECTOMY    . THYROID LOBECTOMY Left 06/16/2016  . THYROID LOBECTOMY Left 06/16/2016   Procedure: LEFT THYROID LOBECTOMY;  Surgeon: Greer Pickerel, MD;  Location: [place];  Service: General;  Laterality: Left;   Allergies  Allergen Reactions  . No Known Allergies    Prior to Admission medications   Medication Sig Start Date End Date Taking? Authorizing Provider  ALPRAZolam Duanne Moron) 1 MG tablet Take 1 tablet (1 mg total) by mouth 2 (two) times daily as needed for anxiety. 10/25/19   Donnal Moat T, PA-C  amLODipine (NORVASC) 5 MG tablet Take 1 tablet (5 mg total) by mouth daily. 09/30/19   Wendie Agreste, MD  buPROPion (WELLBUTRIN XL) 150 MG 24 hr tablet Take 1 tablet (150 mg total) by mouth daily. Take in addition to the 300 mg to equal 450 mg/day. 10/25/19   Addison Lank, PA-C  buPROPion (WELLBUTRIN XL) 300 MG 24 hr tablet Take 1 tablet (300 mg total) by mouth daily. 10/25/19   Donnal Moat T, PA-C  carbidopa-levodopa (SINEMET IR) 25-100 MG tablet TAKE 1 TABLET BY MOUTH EVERYDAY AT BEDTIME Patient not taking: Reported on 10/25/2019 08/21/18   Shawnee Knapp, MD  Cholecalciferol (VITAMIN D) 125 MCG (5000 UT) CAPS Take by mouth.    [provider]  Cholecalciferol (VITAMIN D-3) 5000 units TABS Take 5,000 Units daily by mouth. In addition to the rx high dose once weekly vitamin D of 50Ku. Patient taking differently: Take 2,000 Units by mouth daily. In addition to the rx high dose once weekly vitamin D of 50Ku. 07/24/17   Shawnee Knapp, MD  Coenzyme Q10 (CO Q-10) 300 MG CAPS Take 1 capsule  daily by mouth.    [provider]  docusate sodium (COLACE) 100 MG capsule Take 1 capsule (100 mg total) by mouth 2 (two) times daily. 05/13/17   Shawnee Knapp, MD  DULoxetine (CYMBALTA) 60 MG capsule TAKE 1 CAPSULE BY MOUTH EVERY DAY 11/19/19   Adelene Idler Dorothea Glassman, PA-C  HYDROcodone-acetaminophen (NORCO) 10-325 MG tablet Take 1 tablet by mouth 3 (three) times daily.     [provider]  hydrOXYzine (ATARAX/VISTARIL) 25 MG tablet TAKE 1-2 TABLETS EVERY 6 HOURS AS NEEDED PER ANXIETY 08/04/19   Addison Lank, PA-C  lamoTRIgine (LAMICTAL) 200 MG tablet TAKE 1 TABLET BY MOUTH TWICE A DAY 11/13/19   Donnal Moat T, PA-C  levothyroxine (SYNTHROID) 100 MCG tablet Take 1 tablet (100 mcg total) by mouth daily before breakfast. 09/30/19   Wendie Agreste, MD  magnesium oxide (MAG-OX) 400 MG tablet Take 400 mg by mouth daily.    [provider]  Omega-3 Fatty Acids (FISH OIL) 1200 MG CAPS Take by mouth.    [provider]  omeprazole (PRILOSEC) 40 MG capsule TAKE 1 CAPSULE BY MOUTH TWICE A DAY 12/24/19   Mansouraty, Telford Nab., MD  pravastatin (PRAVACHOL) 40 MG tablet Take 1 tablet (40 mg total) by mouth daily. 09/30/19   Wendie Agreste, MD  traZODone (DESYREL) 100 MG tablet TAKE 3 TABLETS BY MOUTH EVERY DAY 12/23/19   Donnal Moat T, PA-C  Vitamin D, Ergocalciferol, (DRISDOL) 1.25 MG (50000 UNIT) CAPS capsule Take 1 capsule (50,000 Units total) by mouth every 7 (seven) days. 09/30/19   Wendie Agreste, MD  vitamin E 600 UNIT capsule Take 600 Units by mouth daily.    [provider]   Social History   Socioeconomic History  . Marital status: Divorced    Spouse name: Not on file  . Number of children: Not on file  . Years of education: Not on file  . Highest education level: Not on file  Occupational History  . Occupation: Geophysical data processor  Tobacco Use  . Smoking status: Former Smoker    Packs/day: 1.00    Years: 15.00    Pack years: 15.00    Quit date:  06/30/1979    Years since quitting: 40.5  . Smokeless tobacco: Never Used  Substance and Sexual Activity  . Alcohol use: Yes    Alcohol/week: 0.0 - 1.0 standard drinks    Comment: rare  . Drug use: No  . Sexual activity: Yes    Birth control/protection: None  Other Topics Concern  . Not on file  Social History Narrative   Divorced. Education: [employer]. Exercise: No.   Social Determinants of Health   Financial Resource Strain:   . Difficulty of Paying Living Expenses:   Food Insecurity:   . Worried About Charity fundraiser in the Last Year:   . Arboriculturist in the Last Year:   Transportation Needs:   . Film/video editor (Medical):   [name] Lack of Transportation (Non-Medical):   Physical Activity:   . Days of Exercise per Week:   . Minutes of Exercise per Session:   Stress:   . Feeling of Stress :   Social Connections:   . Frequency of Communication with Friends and Family:   . Frequency of Social Gatherings with Friends and Family:   . Attends Religious Services:   . Active Member of Clubs or Organizations:   . Attends Archivist Meetings:   [name] Marital Status:   Intimate Partner Violence:   . Fear of Current or Ex-Partner:   . Emotionally Abused:   [name] Physically Abused:   . Sexually Abused:     Observations/Objective: There were no vitals filed for this visit. Nontoxic based on discussion on phone, appropriate responses without distress, speaking in full sentences.  Understanding expressed the plan with all questions answered.  Assessment and Plan: Diarrhea, unspecified type  -Episode 4 days ago, then overnight.  Now improving.  Able to tolerate sips of fluids today.  Suspect some component of volume depletion, but will try oral rehydration therapy  initially this evening, with ER/urgent care precautions.  RTC precautions/ER precautions if not continue to improve with understanding expressed.  Follow Up Instructions: As needed above.    Patient  Instructions      Based on your symptoms, I suspect that you may have a viral illness.  I am glad to hear that some of your symptoms may be improving this afternoon.  Small sips of fluids frequently should help with the dehydration, but if you are unable to keep down fluids tonight, or worsening diarrhea, then I do recommend urgent care or emergency room to consider possible IV.  If symptoms do not continue to improve the next day or 2, call for follow-up appointment or urgent care/ER evaluation as above.  Good luck.    Diarrhea, Adult Diarrhea is frequent loose and watery bowel movements. Diarrhea can make you feel weak and cause you to become dehydrated. Dehydration can make you tired and thirsty, cause you to have a dry mouth, and decrease how often you urinate. Diarrhea typically lasts 2-3 days. However, it can last longer if it is a sign of something more serious. It is important to treat your diarrhea as told by your health care provider. Follow these instructions at home: Eating and drinking     Follow these recommendations as told by your health care provider:  Take an oral rehydration solution (ORS). This is an over-the-counter medicine that helps return your body to its normal balance of nutrients and water. It is found at pharmacies and retail stores.  Drink plenty of fluids, such as water, ice chips, diluted fruit juice, and low-calorie sports drinks. You can drink milk also, if desired.  Avoid drinking fluids that contain a lot of sugar or caffeine, such as energy drinks, sports drinks, and soda.  Eat bland, easy-to-digest foods in small amounts as you are able. These foods include bananas, applesauce, rice, lean meats, toast, and crackers.  Avoid alcohol.  Avoid spicy or fatty foods.  Medicines  Take over-the-counter and prescription medicines only as told by your health care provider.  If you were prescribed an antibiotic medicine, take it as told by your health care  provider. Do not stop using the antibiotic even if you start to feel better. General instructions   Wash your hands often using soap and water. If soap and water are not available, use a hand sanitizer. Others in the household should wash their hands as well. Hands should be washed: ? After using the toilet or changing a diaper. ? Before preparing, cooking, or serving food. ? While caring for a sick person or while visiting someone in a hospital.  Drink enough fluid to keep your urine pale yellow.  Rest at home while you recover.  Watch your condition for any changes.  Take a warm bath to relieve any burning or pain from frequent diarrhea episodes.  Keep all follow-up visits as told by your health care provider. This is important. Contact a health care provider if:  You have a fever.  Your diarrhea gets worse.  You have new symptoms.  You cannot keep fluids down.  You feel light-headed or dizzy.  You have a headache.  You have muscle cramps. Get help right away if:  You have chest pain.  You feel extremely weak or you faint.  You have bloody or black stools or stools that look like tar.  You have severe pain, cramping, or bloating in your abdomen.  You have trouble breathing or you are breathing  very quickly.  Your heart is beating very quickly.  Your skin feels cold and clammy.  You feel confused.  You have signs of dehydration, such as: ? Dark urine, very little urine, or no urine. ? Cracked lips. ? Dry mouth. ? Sunken eyes. ? Sleepiness. ? Weakness. Summary  Diarrhea is frequent loose and watery bowel movements. Diarrhea can make you feel weak and cause you to become dehydrated.  Drink enough fluids to keep your urine pale yellow.  Make sure that you wash your hands after using the toilet. If soap and water are not available, use hand sanitizer.  Contact a health care provider if your diarrhea gets worse or you have new symptoms.  Get help right  away if you have signs of dehydration. This information is not intended to replace advice given to you by your health care provider. Make sure you discuss any questions you have with your health care provider. Document Revised: 01/08/2019 Document Reviewed: 01/26/2018 Elsevier Patient Education  [employer].   If you have lab work done today you will be contacted with your lab results within the next 2 weeks.  If you have not heard from [place] then please contact us. The fastest way to get your results is to register for My Chart.   IF you received an x-ray today, you will receive an invoice from [facility]. Please contact [facility] at [phone] with questions or concerns regarding your invoice.   IF you received labwork today, you will receive an invoice from [facility]. Please contact [facility] at [phone] with questions or concerns regarding your invoice.   Our billing staff will not be able to assist you with questions regarding bills from these companies.  You will be contacted with the lab results as soon as they are available. The fastest way to get your results is to activate your My Chart account. Instructions are located on the last page of this paperwork. If you have not heard from [place] regarding the results in 2 weeks, please contact this office.          I discussed the assessment and treatment plan with the patient. The patient was provided an opportunity to ask questions and all were answered. The patient agreed with the plan and demonstrated an understanding of the instructions.   The patient was advised to call back or seek an in-person evaluation if the symptoms worsen or if the condition fails to improve as anticipated.  I provided 21 minutes of non-face-to-face time during this encounter. 16 min call, 4 min chart review, instructions.    Wendie Agreste, MD

## 2020-01-13 NOTE — Patient Instructions (Addendum)
Based on your symptoms, I suspect that you may have a viral illness.  I am glad to hear that some of your symptoms may be improving this afternoon.  Small sips of fluids frequently should help with the dehydration, but if you are unable to keep down fluids tonight, or worsening diarrhea, then I do recommend urgent care or emergency room to consider possible IV.  If symptoms do not continue to improve the next day or 2, call for follow-up appointment or urgent care/ER evaluation as above.  Good luck.    Diarrhea, Adult Diarrhea is frequent loose and watery bowel movements. Diarrhea can make you feel weak and cause you to become dehydrated. Dehydration can make you tired and thirsty, cause you to have a dry mouth, and decrease how often you urinate. Diarrhea typically lasts 2-3 days. However, it can last longer if it is a sign of something more serious. It is important to treat your diarrhea as told by your health care provider. Follow these instructions at home: Eating and drinking     Follow these recommendations as told by your health care provider:  Take an oral rehydration solution (ORS). This is an over-the-counter medicine that helps return your body to its normal balance of nutrients and water. It is found at pharmacies and retail stores.  Drink plenty of fluids, such as water, ice chips, diluted fruit juice, and low-calorie sports drinks. You can drink milk also, if desired.  Avoid drinking fluids that contain a lot of sugar or caffeine, such as energy drinks, sports drinks, and soda.  Eat bland, easy-to-digest foods in small amounts as you are able. These foods include bananas, applesauce, rice, lean meats, toast, and crackers.  Avoid alcohol.  Avoid spicy or fatty foods.  Medicines  Take over-the-counter and prescription medicines only as told by your health care provider.  If you were prescribed an antibiotic medicine, take it as told by your health care provider. Do not  stop using the antibiotic even if you start to feel better. General instructions   Wash your hands often using soap and water. If soap and water are not available, use a hand sanitizer. Others in the household should wash their hands as well. Hands should be washed: ? After using the toilet or changing a diaper. ? Before preparing, cooking, or serving food. ? While caring for a sick person or while visiting someone in a hospital.  Drink enough fluid to keep your urine pale yellow.  Rest at home while you recover.  Watch your condition for any changes.  Take a warm bath to relieve any burning or pain from frequent diarrhea episodes.  Keep all follow-up visits as told by your health care provider. This is important. Contact a health care provider if:  You have a fever.  Your diarrhea gets worse.  You have new symptoms.  You cannot keep fluids down.  You feel light-headed or dizzy.  You have a headache.  You have muscle cramps. Get help right away if:  You have chest pain.  You feel extremely weak or you faint.  You have bloody or black stools or stools that look like tar.  You have severe pain, cramping, or bloating in your abdomen.  You have trouble breathing or you are breathing very quickly.  Your heart is beating very quickly.  Your skin feels cold and clammy.  You feel confused.  You have signs of dehydration, such as: ? Dark urine, very little urine, or  no urine. ? Cracked lips. ? Dry mouth. ? Sunken eyes. ? Sleepiness. ? Weakness. Summary  Diarrhea is frequent loose and watery bowel movements. Diarrhea can make you feel weak and cause you to become dehydrated.  Drink enough fluids to keep your urine pale yellow.  Make sure that you wash your hands after using the toilet. If soap and water are not available, use hand sanitizer.  Contact a health care provider if your diarrhea gets worse or you have new symptoms.  Get help right away if you have  signs of dehydration. This information is not intended to replace advice given to you by your health care provider. Make sure you discuss any questions you have with your health care provider. Document Revised: 01/08/2019 Document Reviewed: 01/26/2018 Elsevier Patient Education  [employer].   If you have lab work done today you will be contacted with your lab results within the next 2 weeks.  If you have not heard from [place] then please contact us. The fastest way to get your results is to register for My Chart.   IF you received an x-ray today, you will receive an invoice from [facility] Radiology. Please contact [facility] Radiology at [phone] with questions or concerns regarding your invoice.   IF you received labwork today, you will receive an invoice from [facility]. Please contact [facility] at [phone] with questions or concerns regarding your invoice.   Our billing staff will not be able to assist you with questions regarding bills from these companies.  You will be contacted with the lab results as soon as they are available. The fastest way to get your results is to activate your My Chart account. Instructions are located on the last page of this paperwork. If you have not heard from [place] regarding the results in 2 weeks, please contact this office.

## 2020-01-16 ENCOUNTER — Other Ambulatory Visit: Payer: Self-pay | Admitting: Physician Assistant

## 2020-01-16 NOTE — Telephone Encounter (Signed)
Call pharmacy

## 2020-01-18 ENCOUNTER — Other Ambulatory Visit: Payer: Self-pay | Admitting: Gastroenterology

## 2020-01-20 DIAGNOSIS — R0781 Pleurodynia: Secondary | ICD-10-CM | POA: Diagnosis not present

## 2020-01-20 DIAGNOSIS — S2020XA Contusion of thorax, unspecified, initial encounter: Secondary | ICD-10-CM | POA: Diagnosis not present

## 2020-01-20 DIAGNOSIS — W19XXXA Unspecified fall, initial encounter: Secondary | ICD-10-CM | POA: Diagnosis not present

## 2020-01-20 DIAGNOSIS — Z6833 Body mass index (BMI) 33.0-33.9, adult: Secondary | ICD-10-CM | POA: Diagnosis not present

## 2020-02-04 ENCOUNTER — Ambulatory Visit: Payer: Self-pay

## 2020-02-06 DIAGNOSIS — M79642 Pain in left hand: Secondary | ICD-10-CM | POA: Diagnosis not present

## 2020-02-06 DIAGNOSIS — S29019A Strain of muscle and tendon of unspecified wall of thorax, initial encounter: Secondary | ICD-10-CM | POA: Diagnosis not present

## 2020-02-06 DIAGNOSIS — I10 Essential (primary) hypertension: Secondary | ICD-10-CM | POA: Diagnosis not present

## 2020-02-10 ENCOUNTER — Ambulatory Visit: Payer: Self-pay

## 2020-02-14 ENCOUNTER — Other Ambulatory Visit: Payer: Self-pay | Admitting: Gastroenterology

## 2020-02-19 ENCOUNTER — Other Ambulatory Visit: Payer: Self-pay

## 2020-02-19 MED ORDER — DULOXETINE HCL 60 MG PO CPEP: ORAL_CAPSULE | ORAL | 0 refills | Status: DC

## 2020-03-03 ENCOUNTER — Other Ambulatory Visit: Payer: Self-pay | Admitting: Physician Assistant

## 2020-03-09 ENCOUNTER — Other Ambulatory Visit: Payer: Self-pay | Admitting: Gastroenterology

## 2020-03-23 ENCOUNTER — Other Ambulatory Visit: Payer: Self-pay | Admitting: Physician Assistant

## 2020-03-26 ENCOUNTER — Other Ambulatory Visit: Payer: Self-pay | Admitting: Student

## 2020-03-26 DIAGNOSIS — M25552 Pain in left hip: Secondary | ICD-10-CM | POA: Insufficient documentation

## 2020-03-30 ENCOUNTER — Other Ambulatory Visit: Payer: Self-pay | Admitting: Family Medicine

## 2020-03-30 DIAGNOSIS — I10 Essential (primary) hypertension: Secondary | ICD-10-CM

## 2020-04-02 ENCOUNTER — Other Ambulatory Visit: Payer: Self-pay | Admitting: Gastroenterology

## 2020-04-03 ENCOUNTER — Ambulatory Visit: Payer: PPO | Admitting: Family Medicine

## 2020-04-06 ENCOUNTER — Encounter: Payer: Self-pay | Admitting: Family Medicine

## 2020-04-16 ENCOUNTER — Other Ambulatory Visit: Payer: Self-pay | Admitting: Family Medicine

## 2020-04-16 DIAGNOSIS — E890 Postprocedural hypothyroidism: Secondary | ICD-10-CM

## 2020-04-25 ENCOUNTER — Other Ambulatory Visit: Payer: Self-pay | Admitting: Gastroenterology

## 2020-04-28 ENCOUNTER — Other Ambulatory Visit: Payer: Self-pay | Admitting: Physician Assistant

## 2020-04-28 NOTE — Telephone Encounter (Signed)
Last apt 11/2019

## 2020-04-30 ENCOUNTER — Other Ambulatory Visit: Payer: Self-pay

## 2020-04-30 ENCOUNTER — Encounter: Payer: Self-pay | Admitting: Family Medicine

## 2020-04-30 ENCOUNTER — Ambulatory Visit (INDEPENDENT_AMBULATORY_CARE_PROVIDER_SITE_OTHER): Payer: PPO | Admitting: Family Medicine

## 2020-04-30 VITALS — BP 146/80 | HR 95 | Temp 97.5°F | Ht 64.5 in | Wt 196.0 lb

## 2020-04-30 DIAGNOSIS — E890 Postprocedural hypothyroidism: Secondary | ICD-10-CM

## 2020-04-30 DIAGNOSIS — R251 Tremor, unspecified: Secondary | ICD-10-CM

## 2020-04-30 DIAGNOSIS — M791 Myalgia, unspecified site: Secondary | ICD-10-CM

## 2020-04-30 DIAGNOSIS — R5383 Other fatigue: Secondary | ICD-10-CM | POA: Diagnosis not present

## 2020-04-30 DIAGNOSIS — R7989 Other specified abnormal findings of blood chemistry: Secondary | ICD-10-CM

## 2020-04-30 DIAGNOSIS — E041 Nontoxic single thyroid nodule: Secondary | ICD-10-CM | POA: Diagnosis not present

## 2020-04-30 NOTE — Progress Notes (Signed)
Subjective:  Patient ID: Becky Lawson, female    DOB: 07-09-1951  Age: 69 y.o. MRN: [id number]  CC:  Chief Complaint  Patient presents with  . Fatigue    Pt reports she is having fatigue. thats been going on for some time now. Pt reports its prgressively getting worse. Pt reports she is geeting enough sleep at night.    HPI [name] presents for   Fatigue: Ongoing for years, possibly thought to be due to psych meds or BP meds. Initially 5 years ago, worse since that time.  Feels generally weak and tired all over. Generally all over weak. Uncomfortable feeling in muscles with use. Thinks from sitting around, because less motivation to get up and move. Feels too tired to be standing. Starts to try activity, then has to sit down.  Discussed psych meds prior with psychiatrist, no changes though as Bipolar acts up off meds.   Discussed in January - able to clean house, better, more active at that time off the candesartan.  feels worse since then.  Trouble with memory past month or two. 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  Some increased stress in past 2 months.  Legs restless at times. Feels tired when waking up.   Psychiatry appt in next week. Alprazolam at night. lamictal BID. Trazodone 38m QHS  - no recent dose changes.   Hydrocodone 3-4 per day, using these past few months after fall - has been decreasing dose.  Has tremors - talked to neurology in past - treated with Sinemet in past, not recent. Unable to have DATscan to confirm, but possible parkinson's. plans to call neuro for appt.   Hypothyroidism: Lab Results  Component Value Date   TSH 1.590 09/30/2019  synthroid 108m qd.  Glucose 105.  Vit D 26 in 09/30/19. 50,000 units per week and 1,000u otc qd.  Ultrasound of thyroid 11/07/17: IMPRESSION: 1. No evidence of residual/recurrent tissue post left thyroid lobectomy. 2. Stable solitary right nodule. Recommend 1-2 year  follow-up surveillance ultrasound, until stability x5 years confirmed. (0.7cm - stable since June 2017)   Lab Results  Component Value Date   WBC 7.3 03/20/2019   HGB 13.9 03/20/2019   HCT 40.7 03/20/2019   MCV 93.5 03/20/2019   PLT 211 12/05/2018   Lab Results  Component Value Date   NA 139 09/30/2019   K 4.3 09/30/2019   CL 106 09/30/2019   CO2 24 09/30/2019    History Patient Active Problem List   Diagnosis Date Noted  . Constipation 05/10/2019  . Change in bowel habit 05/10/2019  . Dysphagia 05/10/2019  . Tremor 07/12/2018  . Prediabetes 07/12/2018  . Polypharmacy 07/12/2018  . Unintentional weight loss of more than 10 pounds 07/12/2018  . OSA (obstructive sleep apnea) 06/02/2018  . Fatty liver 04/18/2017  . GERD (gastroesophageal reflux disease) 10/28/2016  . 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 Chronic throat clearing 06/24/2016  . S/P partial thyroidectomy 06/16/2016  . Multinodular goiter 06/02/2016  . Thyroid nodule 02/08/2016  . Hx of hepatitis C 05/19/2015  . Vitamin D deficiency 05/19/2015  . Spinal stenosis of lumbar region 05/19/2015  . Hyperlipidemia 05/19/2015  . B[place] disorder ([place]/13/2016  . Essential hypertension 09/26/2013  . Undiagnosed cardiac murmurs 09/26/2013   Past Medical History:  Diagnosis Date  . Anxiety   . Arthritis   . Benign cyst of right kidney 2014   2.7 cm right kidney cyst seen on imaging  2014 but was c/o right flank pain with new persistent proteinuria (fortunately hematuria had resolved) so repeat US 11/2016 showed right kidney still with 2.6 cm simple cyst and otherwise nml.  . Bipolar 1 disorder ([place])   . Chronic kidney disease   . Depression   . GERD (gastroesophageal reflux disease)   . Heart murmur   . Hepatitis C    resolved completely after treatment in 2014, genotype 1b, followed at [facility]  . Hyperlipidemia   . Hypertension   . Jaundice 11/01/2012  . Pruritic disorder 02/13/2013  . Sleep apnea     Was diagnosed approximately 20 years ago, but does not wear CPAP patient stated "I gave it back.Marland KitchenMarland KitchenI couldn't wear that thing it was horrible"  . Spinal stenosis    Past Surgical History:  Procedure Laterality Date  . ABDOMINAL HYSTERECTOMY    . COLONOSCOPY W/ POLYPECTOMY    . JOINT REPLACEMENT Bilateral    knee  . KNEE ARTHROSCOPY Bilateral   . PARTIAL HYSTERECTOMY    . THYROID LOBECTOMY Left 06/16/2016  . THYROID LOBECTOMY Left 06/16/2016   Procedure: LEFT THYROID LOBECTOMY;  Surgeon: Greer Pickerel, MD;  Location: [facility];  Service: General;  Laterality: Left;   Allergies  Allergen Reactions  . No Known Allergies    Prior to Admission medications   Medication Sig Start Date End Date Taking? Authorizing Provider  ALPRAZolam Duanne Moron) 1 MG tablet Take 1 tablet (1 mg total) by mouth 2 (two) times daily as needed for anxiety. 10/25/19  Yes Hurst, Teresa T, PA-C  amLODipine (NORVASC) 5 MG tablet TAKE 1 TABLET BY MOUTH EVERY DAY 03/30/20  Yes Wendie Agreste, MD  buPROPion (WELLBUTRIN XL) 150 MG 24 hr tablet Take 1 tablet (150 mg total) by mouth daily. Take in addition to the 300 mg to equal 450 mg/day. 10/25/19  Yes Hurst, Dorothea Glassman, PA-C  buPROPion (WELLBUTRIN XL) 300 MG 24 hr tablet Take 1 tablet (300 mg total) by mouth daily. 10/25/19  Yes Hurst, Helene Kelp T, PA-C  carbidopa-levodopa (SINEMET IR) 25-100 MG tablet TAKE 1 TABLET BY MOUTH EVERYDAY AT BEDTIME 08/21/18  Yes Shawnee Knapp, MD  Cholecalciferol (VITAMIN D) 125 MCG (5000 UT) CAPS Take by mouth.   Yes [provider]  Cholecalciferol (VITAMIN D-3) 5000 units TABS Take 5,000 Units daily by mouth. In addition to the rx high dose once weekly vitamin D of 50Ku. Patient taking differently: Take 2,000 Units by mouth daily. In addition to the rx high dose once weekly vitamin D of 50Ku. 07/24/17  Yes Shawnee Knapp, MD  Coenzyme Q10 (CO Q-10) 300 MG CAPS Take 1 capsule daily by mouth.   Yes [provider]  docusate sodium (COLACE) 100  MG capsule Take 1 capsule (100 mg total) by mouth 2 (two) times daily. 05/13/17  Yes Shawnee Knapp, MD  DULoxetine (CYMBALTA) 60 MG capsule TAKE 1 CAPSULE BY MOUTH EVERY DAY 02/19/20  Yes Hurst, Dorothea Glassman, PA-C  HYDROcodone-acetaminophen (NORCO) 10-325 MG tablet Take 1 tablet by mouth 3 (three) times daily.    Yes [provider]  hydrOXYzine (ATARAX/VISTARIL) 25 MG tablet TAKE 1-2 TABLETS EVERY 6 HOURS AS NEEDED PER ANXIETY 08/04/19  Yes Hurst, Teresa T, PA-C  lamoTRIgine (LAMICTAL) 200 MG tablet TAKE 1 TABLET BY MOUTH TWICE A DAY 03/05/20  Yes Hurst, Teresa T, PA-C  levothyroxine (SYNTHROID) 100 MCG tablet TAKE 1 TABLET BY MOUTH DAILY BEFORE BREAKFAST. 04/16/20  Yes Wendie Agreste, MD  magnesium oxide (MAG-OX)  400 MG tablet Take 400 mg by mouth daily.   Yes [provider]  Omega-3 Fatty Acids (FISH OIL) 1200 MG CAPS Take by mouth.   Yes [provider]  omeprazole (PRILOSEC) 40 MG capsule TAKE 1 CAPSULE BY MOUTH TWICE A DAY 04/02/20  Yes Mansouraty, Telford Nab., MD  pravastatin (PRAVACHOL) 40 MG tablet Take 1 tablet (40 mg total) by mouth daily. 09/30/19  Yes Wendie Agreste, MD  traZODone (DESYREL) 100 MG tablet TAKE 3 TABLETS BY MOUTH EVERY DAY 03/23/20  Yes Hurst, Teresa T, PA-C  Vitamin D, Ergocalciferol, (DRISDOL) 1.25 MG (50000 UNIT) CAPS capsule Take 1 capsule (50,000 Units total) by mouth every 7 (seven) days. 09/30/19  Yes Wendie Agreste, MD  vitamin E 600 UNIT capsule Take 600 Units by mouth daily.   Yes [provider]   Social History   Socioeconomic History  . Marital status: Divorced    Spouse name: Not on file  . Number of children: Not on file  . Years of education: Not on file  . Highest education level: Not on file  Occupational History  . Occupation: Geophysical data processor  Tobacco Use  . Smoking status: Former Smoker    Packs/day: 1.00    Years: 15.00    Pack years: 15.00    Quit date: 06/30/1979    Years since quitting: 40.8  .  Smokeless tobacco: Never Used  Vaping Use  . Vaping Use: Never used  Substance and Sexual Activity  . Alcohol use: Yes    Alcohol/week: 0.0 - 1.0 standard drinks    Comment: rare  . Drug use: No  . Sexual activity: Yes    Birth control/protection: None  Other Topics Concern  . Not on file  Social History Narrative   Divorced. Education: [employer]. Exercise: No.   Social Determinants of Health   Financial Resource Strain:   . Difficulty of Paying Living Expenses: Not on file  Food Insecurity:   . Worried About Charity fundraiser in the Last Year: Not on file  . Ran Out of Food in the Last Year: Not on file  Transportation Needs:   . Lack of Transportation (Medical): Not on file  . Lack of Transportation (Non-Medical): Not on file  Physical Activity:   . Days of Exercise per Week: Not on file  . Minutes of Exercise per Session: Not on file  Stress:   . Feeling of Stress : Not on file  Social Connections:   . Frequency of Communication with Friends and Family: Not on file  . Frequency of Social Gatherings with Friends and Family: Not on file  . Attends Religious Services: Not on file  . Active Member of Clubs or Organizations: Not on file  . Attends Archivist Meetings: Not on file  . Marital Status: Not on file  Intimate Partner Violence:   . Fear of Current or Ex-Partner: Not on file  . Emotionally Abused: Not on file  . Physically Abused: Not on file  . Sexually Abused: Not on file    Review of Systems Per HPI.   Objective:   Vitals:   04/30/20 1355 04/30/20 1401  BP: (!) 159/88 (!) 146/80  Pulse: 95   Temp: (!) 97.5 F (36.4 C)   TempSrc: Temporal   SpO2: 95%   Weight: 196 lb (88.9 kg)   Height: 5' 4.5" (1.638 m)      Physical Exam Vitals reviewed.  Constitutional:      Appearance:  She is well-developed.  HENT:     Head: Normocephalic and atraumatic.  Eyes:     Conjunctiva/sclera: Conjunctivae normal.     Pupils: Pupils are equal, round,  and reactive to light.  Neck:     Vascular: No carotid bruit.     Comments: Well healed scar anterior, no mass appreciated.    Cardiovascular:     Rate and Rhythm: Normal rate and regular rhythm.     Heart sounds: Normal heart sounds.  Pulmonary:     Effort: Pulmonary effort is normal.     Breath sounds: Normal breath sounds.  Abdominal:     Palpations: Abdomen is soft. There is no pulsatile mass.     Tenderness: There is no abdominal tenderness.  Skin:    General: Skin is warm and dry.  Neurological:     General: No focal deficit present.     Mental Status: She is alert and oriented to person, place, and time.  Psychiatric:        Mood and Affect: Mood normal.        Behavior: Behavior normal.     Comments: Calm, appropriate interaction      Assessment & Plan:  Jayde Mcallister is a 69 y.o. female . Other fatigue - Plan: TSH, CBC, Comprehensive metabolic panel, CK  Myalgia - Plan: CK  Tremor  Thyroid nodule - Plan: US THYROID  Postoperative hypothyroidism - Plan: TSH, US THYROID  Low serum vitamin D - Plan: Vitamin D, 25-hydroxy  Still possible multifactorial fatigue.  Could be related to her psychiatric medication as previously with polypharmacy, also with history of tremor, possible parkinsonism,, and now some subjective memory concerns differential includes Parkinson's.  Other symptoms of muscle fatigue, decreased tolerance to activity differential includes pulmonology or possible ME/CFS.  -We will repeat CBC, vitamin D that was low previously, BMP and repeat TSH.  CPK previously normal, will repeat with muscle symptoMS   Continue vitamin D supplementation.  -Due for repeat thyroid ultrasound for right nodule.  Ordered.  Continue same dose medication for now with repeat eval in 6 weeks.  -Agree with follow-up with neurology.  She will schedule.  Also advised to discuss symptoms with both neurology and psychiatry to help determine plan and potential medication  changes.  -PT eval option, will initially evaluate through neurology  No orders of the defined types were placed in this encounter.  Patient Instructions    I do recommend calling neurology for follow-up appointment to discuss your fatigue, muscle symptoms with activity, tremor, and memory issues as other testing or medication changes may be needed.  Symptoms can also be discussed and coordinated with her psychiatrist and neurology.  Physical therapy may be an option but I would like to continue with neurology first, and I am happy to place that referral if needed.  I will check a thyroid ultrasound and repeat blood work today.  Recheck 6 weeks.  Return to the clinic or go to the nearest emergency room if any of your symptoms worsen or new symptoms occur.   Fatigue If you have fatigue, you feel tired all the time and have a lack of energy or a lack of motivation. Fatigue may make it difficult to start or complete tasks because of exhaustion. In general, occasional or mild fatigue is often a normal response to activity or life. However, long-lasting (chronic) or extreme fatigue may be a symptom of a medical condition. Follow these instructions at home: General instructions  Watch your fatigue  for any changes.  Go to bed and get up at the same time every day.  Avoid fatigue by pacing yourself during the day and getting enough sleep at night.  Maintain a healthy weight. Medicines  Take over-the-counter and prescription medicines only as told by your health care provider.  Take a multivitamin, if told by your health care provider.  Do not use herbal or dietary supplements unless they are approved by your health care provider. Activity   Exercise regularly, as told by your health care provider.  Use or practice techniques to help you relax, such as yoga, tai chi, meditation, or massage therapy. Eating and drinking   Avoid heavy meals in the evening.  Eat a well-balanced diet,  which includes lean proteins, whole grains, plenty of fruits and vegetables, and low-fat dairy products.  Avoid consuming too much caffeine.  Avoid the use of alcohol.  Drink enough fluid to keep your urine pale yellow. Lifestyle  Change situations that cause you stress. Try to keep your work and personal schedule in balance.  Do not use any products that contain nicotine or tobacco, such as cigarettes and e-cigarettes. If you need help quitting, ask your health care provider.  Do not use drugs. Contact a health care provider if:  Your fatigue does not get better.  You have a fever.  You suddenly lose or gain weight.  You have headaches.  You have trouble falling asleep or sleeping through the night.  You feel angry, guilty, anxious, or sad.  You are unable to have a bowel movement (constipation).  Your skin is dry.  You have swelling in your legs or another part of your body. Get help right away if:  You feel confused.  Your vision is blurry.  You feel faint or you pass out.  You have a severe headache.  You have severe pain in your abdomen, your back, or the area between your waist and hips (pelvis).  You have chest pain, shortness of breath, or an irregular or fast heartbeat.  You are unable to urinate, or you urinate less than normal.  You have abnormal bleeding, such as bleeding from the rectum, vagina, nose, lungs, or nipples.  You vomit blood.  You have thoughts about hurting yourself or others. If you ever feel like you may hurt yourself or others, or have thoughts about taking your own life, get help right away. You can go to your nearest emergency department or call:  Your local emergency services (911 in the [place]  A suicide crisis helpline, such as the [place] at (819)204-8117. This is open 24 hours a day. Summary  If you have fatigue, you feel tired all the time and have a lack of energy or a lack of  motivation.  Fatigue may make it difficult to start or complete tasks because of exhaustion.  Long-lasting (chronic) or extreme fatigue may be a symptom of a medical condition.  Exercise regularly, as told by your health care provider.  Change situations that cause you stress. Try to keep your work and personal schedule in balance. This information is not intended to replace advice given to you by your health care provider. Make sure you discuss any questions you have with your health care provider. Document Revised: 03/13/2019 Document Reviewed: 05/17/2017 Elsevier Patient Education  [employer].    If you have lab work done today you will be contacted with your lab results within the next 2 weeks.  If you  have not heard from [place] then please contact us. The fastest way to get your results is to register for My Chart.   IF you received an x-ray today, you will receive an invoice from [facility]. Please contact [facility] at [phone] with questions or concerns regarding your invoice.   IF you received labwork today, you will receive an invoice from [facility]. Please contact [facility] at [phone] with questions or concerns regarding your invoice.   Our billing staff will not be able to assist you with questions regarding bills from these companies.  You will be contacted with the lab results as soon as they are available. The fastest way to get your results is to activate your My Chart account. Instructions are located on the last page of this paperwork. If you have not heard from [place] regarding the results in 2 weeks, please contact this office.         Signed, Merri Ray, MD [facility]

## 2020-04-30 NOTE — Patient Instructions (Addendum)
I do recommend calling neurology for follow-up appointment to discuss your fatigue, muscle symptoms with activity, tremor, and memory issues as other testing or medication changes may be needed.  Symptoms can also be discussed and coordinated with her psychiatrist and neurology.  Physical therapy may be an option but I would like to continue with neurology first, and I am happy to place that referral if needed.  I will check a thyroid ultrasound and repeat blood work today.  Recheck 6 weeks.  Return to the clinic or go to the nearest emergency room if any of your symptoms worsen or new symptoms occur.   Fatigue If you have fatigue, you feel tired all the time and have a lack of energy or a lack of motivation. Fatigue may make it difficult to start or complete tasks because of exhaustion. In general, occasional or mild fatigue is often a normal response to activity or life. However, long-lasting (chronic) or extreme fatigue may be a symptom of a medical condition. Follow these instructions at home: General instructions  Watch your fatigue for any changes.  Go to bed and get up at the same time every day.  Avoid fatigue by pacing yourself during the day and getting enough sleep at night.  Maintain a healthy weight. Medicines  Take over-the-counter and prescription medicines only as told by your health care provider.  Take a multivitamin, if told by your health care provider.  Do not use herbal or dietary supplements unless they are approved by your health care provider. Activity   Exercise regularly, as told by your health care provider.  Use or practice techniques to help you relax, such as yoga, tai chi, meditation, or massage therapy. Eating and drinking   Avoid heavy meals in the evening.  Eat a well-balanced diet, which includes lean proteins, whole grains, plenty of fruits and vegetables, and low-fat dairy products.  Avoid consuming too much caffeine.  Avoid the use of  alcohol.  Drink enough fluid to keep your urine pale yellow. Lifestyle  Change situations that cause you stress. Try to keep your work and personal schedule in balance.  Do not use any products that contain nicotine or tobacco, such as cigarettes and e-cigarettes. If you need help quitting, ask your health care provider.  Do not use drugs. Contact a health care provider if:  Your fatigue does not get better.  You have a fever.  You suddenly lose or gain weight.  You have headaches.  You have trouble falling asleep or sleeping through the night.  You feel angry, guilty, anxious, or sad.  You are unable to have a bowel movement (constipation).  Your skin is dry.  You have swelling in your legs or another part of your body. Get help right away if:  You feel confused.  Your vision is blurry.  You feel faint or you pass out.  You have a severe headache.  You have severe pain in your abdomen, your back, or the area between your waist and hips (pelvis).  You have chest pain, shortness of breath, or an irregular or fast heartbeat.  You are unable to urinate, or you urinate less than normal.  You have abnormal bleeding, such as bleeding from the rectum, vagina, nose, lungs, or nipples.  You vomit blood.  You have thoughts about hurting yourself or others. If you ever feel like you may hurt yourself or others, or have thoughts about taking your own life, get help right away. You can go to  your nearest emergency department or call:  Your local emergency services (911 in the [place]  A suicide crisis helpline, such as the [place] at 530-628-6706. This is open 24 hours a day. Summary  If you have fatigue, you feel tired all the time and have a lack of energy or a lack of motivation.  Fatigue may make it difficult to start or complete tasks because of exhaustion.  Long-lasting (chronic) or extreme fatigue may be a symptom of a medical  condition.  Exercise regularly, as told by your health care provider.  Change situations that cause you stress. Try to keep your work and personal schedule in balance. This information is not intended to replace advice given to you by your health care provider. Make sure you discuss any questions you have with your health care provider. Document Revised: 03/13/2019 Document Reviewed: 05/17/2017 Elsevier Patient Education  [employer].    If you have lab work done today you will be contacted with your lab results within the next 2 weeks.  If you have not heard from [place] then please contact us. The fastest way to get your results is to register for My Chart.   IF you received an x-ray today, you will receive an invoice from [facility]. Please contact [facility] at [phone] with questions or concerns regarding your invoice.   IF you received labwork today, you will receive an invoice from [place]. Please contact [facility] at [phone] with questions or concerns regarding your invoice.   Our billing staff will not be able to assist you with questions regarding bills from these companies.  You will be contacted with the lab results as soon as they are available. The fastest way to get your results is to activate your My Chart account. Instructions are located on the last page of this paperwork. If you have not heard from [place] regarding the results in 2 weeks, please contact this office.

## 2020-05-01 LAB — COMPREHENSIVE METABOLIC PANEL
ALT: 12 IU/L (ref 0–32)
AST: 17 IU/L (ref 0–40)
Albumin/Globulin Ratio: 2.2 (ref 1.2–2.2)
Albumin: 4.4 g/dL (ref 3.8–4.8)
Alkaline Phosphatase: 84 IU/L (ref 48–121)
BUN/Creatinine Ratio: 15 (ref 12–28)
BUN: 13 mg/dL (ref 8–27)
Bilirubin Total: 0.2 mg/dL (ref 0.0–1.2)
CO2: 25 mmol/L (ref 20–29)
Calcium: 9.5 mg/dL (ref 8.7–10.3)
Chloride: 104 mmol/L (ref 96–106)
Creatinine, Ser: 0.86 mg/dL (ref 0.57–1.00)
GFR calc Af Amer: 80 mL/min/{1.73_m2} (ref >59)
GFR calc non Af Amer: 69 mL/min/{1.73_m2} (ref >59)
Globulin, Total: 2 g/dL (ref 1.5–4.5)
Glucose: 115 mg/dL — ABNORMAL HIGH (ref 65–99)
Potassium: 4.9 mmol/L (ref 3.5–5.2)
Sodium: 140 mmol/L (ref 134–144)
Total Protein: 6.4 g/dL (ref 6.0–8.5)

## 2020-05-01 LAB — CK: Total CK: 67 U/L (ref 32–182)

## 2020-05-01 LAB — CBC
Hematocrit: 42 % (ref 34.0–46.6)
Hemoglobin: 14 g/dL (ref 11.1–15.9)
MCH: 31.5 pg (ref 26.6–33.0)
MCHC: 33.3 g/dL (ref 31.5–35.7)
MCV: 95 fL (ref 79–97)
Platelets: 237 10*3/uL (ref 150–450)
RBC: 4.44 x10E6/uL (ref 3.77–5.28)
RDW: 12.2 % (ref 11.7–15.4)
WBC: 5.4 10*3/uL (ref 3.4–10.8)

## 2020-05-01 LAB — TSH: TSH: 1.16 u[IU]/mL (ref 0.450–4.500)

## 2020-05-01 LAB — VITAMIN D 25 HYDROXY (VIT D DEFICIENCY, FRACTURES): Vit D, 25-Hydroxy: 55 ng/mL (ref 30.0–100.0)

## 2020-05-04 ENCOUNTER — Telehealth: Payer: Self-pay | Admitting: Physician Assistant

## 2020-05-04 NOTE — Telephone Encounter (Signed)
Pt scheduled follow up. First available 9/30. Requesting refill for Xanax @ [facility] [address] [employer]

## 2020-05-05 ENCOUNTER — Other Ambulatory Visit: Payer: Self-pay | Admitting: Physician Assistant

## 2020-05-06 ENCOUNTER — Telehealth: Payer: Self-pay | Admitting: Physician Assistant

## 2020-05-06 ENCOUNTER — Other Ambulatory Visit: Payer: Self-pay

## 2020-05-06 MED ORDER — ALPRAZOLAM 1 MG PO TABS: 1.0000 mg | ORAL_TABLET | Freq: Two times a day (BID) | ORAL | 0 refills | Status: DC | PRN

## 2020-05-06 NOTE — Telephone Encounter (Signed)
Pt would like a refill on Xanax. Please send to [facility] on [address]. Pt has appt on 9/22

## 2020-05-06 NOTE — Telephone Encounter (Signed)
Last refill 03/15/20 Pended for Helene Kelp to send

## 2020-05-08 ENCOUNTER — Ambulatory Visit
Admission: RE | Admit: 2020-05-08 | Discharge: 2020-05-08 | Disposition: A | Discharge status: Home / Self Care | Payer: PPO | Source: Ambulatory Visit | Attending: Family Medicine | Admitting: Family Medicine

## 2020-05-08 DIAGNOSIS — E041 Nontoxic single thyroid nodule: Secondary | ICD-10-CM

## 2020-05-08 DIAGNOSIS — E890 Postprocedural hypothyroidism: Secondary | ICD-10-CM

## 2020-05-25 DIAGNOSIS — Z79899 Other long term (current) drug therapy: Secondary | ICD-10-CM | POA: Diagnosis not present

## 2020-05-25 DIAGNOSIS — M5136 Other intervertebral disc degeneration, lumbar region: Secondary | ICD-10-CM | POA: Insufficient documentation

## 2020-05-25 DIAGNOSIS — Z79891 Long term (current) use of opiate analgesic: Secondary | ICD-10-CM | POA: Diagnosis not present

## 2020-05-25 DIAGNOSIS — Z5181 Encounter for therapeutic drug level monitoring: Secondary | ICD-10-CM | POA: Diagnosis not present

## 2020-05-27 ENCOUNTER — Other Ambulatory Visit: Payer: Self-pay | Admitting: Physician Assistant

## 2020-05-27 NOTE — Telephone Encounter (Signed)
REVIEW

## 2020-05-28 ENCOUNTER — Other Ambulatory Visit: Payer: Self-pay | Admitting: Physician Assistant

## 2020-05-28 NOTE — Telephone Encounter (Signed)
review 

## 2020-06-04 ENCOUNTER — Other Ambulatory Visit: Payer: Self-pay

## 2020-06-04 ENCOUNTER — Encounter: Payer: Self-pay | Admitting: Physician Assistant

## 2020-06-04 ENCOUNTER — Ambulatory Visit (INDEPENDENT_AMBULATORY_CARE_PROVIDER_SITE_OTHER): Payer: PPO | Admitting: Physician Assistant

## 2020-06-04 VITALS — BP 168/86 | HR 83

## 2020-06-04 DIAGNOSIS — R5383 Other fatigue: Secondary | ICD-10-CM | POA: Diagnosis not present

## 2020-06-04 DIAGNOSIS — F3175 Bipolar disorder, in partial remission, most recent episode depressed: Secondary | ICD-10-CM

## 2020-06-04 DIAGNOSIS — F99 Mental disorder, not otherwise specified: Secondary | ICD-10-CM | POA: Diagnosis not present

## 2020-06-04 DIAGNOSIS — F5105 Insomnia due to other mental disorder: Secondary | ICD-10-CM

## 2020-06-04 DIAGNOSIS — F411 Generalized anxiety disorder: Secondary | ICD-10-CM | POA: Diagnosis not present

## 2020-06-04 MED ORDER — HYDROXYZINE HCL 25 MG PO TABS: 25.0000 mg | ORAL_TABLET | Freq: Three times a day (TID) | ORAL | 5 refills | Status: DC | PRN

## 2020-06-04 MED ORDER — TRAZODONE HCL 100 MG PO TABS: 300.0000 mg | ORAL_TABLET | Freq: Every day | ORAL | 3 refills | Status: DC

## 2020-06-04 MED ORDER — MODAFINIL 200 MG PO TABS: 100.0000 mg | ORAL_TABLET | Freq: Every morning | ORAL | 1 refills | Status: DC

## 2020-06-04 NOTE — Progress Notes (Signed)
[facility]  Patient ID: Becky Lawson,  MRN: [id number]  PCP: Wendie Agreste, MD  Date of Evaluation: 06/04/2020 Time spent:30 minutes  Chief Complaint:  Chief Complaint    Fatigue; Anxiety; Depression      HISTORY/CURRENT STATUS: HPI For routine f/u.   Fatigue is a big problem. Has seen her PCP and has had labs done. No problems found. Used to take Dexedrine in the past and would like to get back on it. She doesn't even get up to fold clothes or other typical household chores anymore. She feels very lethargic and sits on the couch all the time playing computer games.  States she does not feel depressed. She is able to enjoy the computer games. She does not isolate. Does not cry easily. No suicidal or homicidal thoughts.  The anxiety is still a problem sometimes.  She tries the hydroxyzine first and that will often help.  But when it does not or if the anxiety is really bad she will go ahead and take a Xanax.  The hydroxyzine is less sedating for her.  Patient denies increased energy with decreased need for sleep, no increased talkativeness, no racing thoughts, no impulsivity or risky behaviors, no increased spending, no increased libido, no grandiosity.  Denies dizziness, syncope, seizures, numbness, tingling, tics, unsteady gait, slurred speech, confusion. Denies muscle or joint pain, stiffness, or dystonia.  Individual Medical History/ Review of Systems: Changes? :No    Past medications for mental health diagnoses include: Lithium, Seroquel, Effexor, Prozac, Latuda, Abilify,Dexedrine.  Allergies: No known allergies  Current Medications:  Current Outpatient Medications:  .  ALPRAZolam (XANAX) 1 MG tablet, Take 1 tablet (1 mg total) by mouth 2 (two) times daily as needed for anxiety., Disp: 130 tablet, Rfl: 0 .  amLODipine (NORVASC) 5 MG tablet, TAKE 1 TABLET BY MOUTH EVERY DAY, Disp: 90 tablet, Rfl: 1 .  Cholecalciferol (VITAMIN D) 125 MCG (5000 UT) CAPS, Take  by mouth., Disp: , Rfl:  .  Cholecalciferol (VITAMIN D-3) 5000 units TABS, Take 5,000 Units daily by mouth. In addition to the rx high dose once weekly vitamin D of 50Ku. (Patient taking differently: Take 2,000 Units by mouth daily. In addition to the rx high dose once weekly vitamin D of 50Ku.), Disp: 30 tablet, Rfl:  .  Coenzyme Q10 (CO Q-10) 300 MG CAPS, Take 1 capsule daily by mouth., Disp: , Rfl:  .  DULoxetine (CYMBALTA) 60 MG capsule, TAKE 1 CAPSULE BY MOUTH EVERY DAY, Disp: 90 capsule, Rfl: 0 .  HYDROcodone-acetaminophen (NORCO) 10-325 MG tablet, Take 1 tablet by mouth 3 (three) times daily. , Disp: , Rfl:  .  hydrOXYzine (ATARAX/VISTARIL) 25 MG tablet, Take 1 tablet (25 mg total) by mouth every 8 (eight) hours as needed., Disp: 60 tablet, Rfl: 5 .  lamoTRIgine (LAMICTAL) 200 MG tablet, TAKE 1 TABLET BY MOUTH TWICE A DAY, Disp: 180 tablet, Rfl: 0 .  levothyroxine (SYNTHROID) 100 MCG tablet, TAKE 1 TABLET BY MOUTH DAILY BEFORE BREAKFAST., Disp: 90 tablet, Rfl: 1 .  magnesium oxide (MAG-OX) 400 MG tablet, Take 400 mg by mouth daily., Disp: , Rfl:  .  Omega-3 Fatty Acids (FISH OIL) 1200 MG CAPS, Take by mouth., Disp: , Rfl:  .  omeprazole (PRILOSEC) 40 MG capsule, TAKE 1 CAPSULE BY MOUTH TWICE A DAY, Disp: 180 capsule, Rfl: 1 .  pravastatin (PRAVACHOL) 40 MG tablet, Take 1 tablet (40 mg total) by mouth daily., Disp: 90 tablet, Rfl: 3 .  traZODone (DESYREL) 100  MG tablet, Take 3 tablets (300 mg total) by mouth daily., Disp: 270 tablet, Rfl: 3 .  Vitamin D, Ergocalciferol, (DRISDOL) 1.25 MG (50000 UNIT) CAPS capsule, Take 1 capsule (50,000 Units total) by mouth every 7 (seven) days., Disp: 12 capsule, Rfl: 3 .  vitamin E 600 UNIT capsule, Take 600 Units by mouth daily., Disp: , Rfl:  .  carbidopa-levodopa (SINEMET IR) 25-100 MG tablet, TAKE 1 TABLET BY MOUTH EVERYDAY AT BEDTIME (Patient not taking: Reported on 06/04/2020), Disp: 90 tablet, Rfl: 1 .  docusate sodium (COLACE) 100 MG capsule, Take 1  capsule (100 mg total) by mouth 2 (two) times daily. (Patient not taking: Reported on 06/04/2020), Disp: 10 capsule, Rfl: 0 .  modafinil (PROVIGIL) 200 MG tablet, Take 0.5 tablets (100 mg total) by mouth in the morning., Disp: 30 tablet, Rfl: 1  Current Facility-Administered Medications:  .  cyanocobalamin ((VITAMIN B-12)) injection 1,000 mcg, 1,000 mcg, Intramuscular, Q30 days, Shawnee Knapp, MD, 1,000 mcg at 07/12/18 1652 Medication Side Effects: none  Family Medical/ Social History: Changes? No  MENTAL HEALTH EXAM:  Blood pressure (!) 168/86, pulse 83.There is no height or weight on file to calculate BMI.  General Appearance: Casual, Neat, Well Groomed and Obese  Eye Contact:  Good  Speech:  Clear and Coherent and Normal Rate  Volume:  Normal  Mood:  Euthymic  Affect:  Appropriate  Thought Process:  Goal Directed and Descriptions of Associations: Intact  Orientation:  Full (Time, Place, and Person)  Thought Content: Logical   Suicidal Thoughts:  No  Homicidal Thoughts:  No  Memory:  WNL  Judgement:  Good  Insight:  Good  Psychomotor Activity:  Normal  Concentration:  Concentration: Good  Recall:  Good  Fund of Knowledge: Good  Language: Good  Assets:  Desire for Improvement  ADL's:  Intact  Cognition: WNL  Prognosis:  Good    DIAGNOSES:    ICD-10-CM   1. Fatigue, unspecified type  R53.83   2. Bipolar disorder, in partial remission, most recent episode depressed ([place])  F31.75   3. Generalized anxiety disorder  F41.1   4. Insomnia due to other mental disorder  F51.05    F99     Receiving Psychotherapy: No    RECOMMENDATIONS:  PDMP was reviewed. I provided 30 minutes of face-to-face time during this encounter. I reviewed her labs on chart. We discussed the fatigue. She does not really seem depressed and since the Wellbutrin is not helping at all we agreed to wean off of it.  We discussed the fact that she is on Xanax to help her go to sleep and even though she does  not take it during the day, it is still a downer. Dexedrine is a powerful stimulant and I'm not comfortable prescribing that but will give modafinil which is not as strong of the stimulant. She would like to try it. We discussed the benefits, risks and side effects and she would like to try. We discussed the fact that if she does not need the Xanax don't take it, and then hopefully she won't feel as drowsy during the day. She only takes the Xanax at night. Start modafinil 200 mg, one half p.o. every morning. Wean off Wellbutrin XL by taking 300 mg for 1 week, then 150 mg a day for 1 week and then stop. Continue Xanax 1 mg, 1 p.o. nightly as needed. Continue Cymbalta 60 mg 1 p.o. daily. Continue hydroxyzine 25 mg, 1-2 every 6 hours as needed  anxiety. Continue Lamictal 200 mg, 1 p.o. twice daily. Continue trazodone 100 mg, 3 p.o. nightly. Return in 6 weeks.   Donnal Moat, PA-C

## 2020-06-08 ENCOUNTER — Telehealth: Payer: Self-pay | Admitting: Family Medicine

## 2020-06-08 NOTE — Telephone Encounter (Signed)
LVMTCB to change appt to a virtual appt with provider. Appt is already changed

## 2020-06-10 ENCOUNTER — Other Ambulatory Visit: Payer: PPO

## 2020-06-10 DIAGNOSIS — M5432 Sciatica, left side: Secondary | ICD-10-CM | POA: Diagnosis not present

## 2020-06-11 ENCOUNTER — Telehealth: Payer: Self-pay | Admitting: *Deleted

## 2020-06-11 NOTE — Telephone Encounter (Signed)
Schedule AWV.  

## 2020-06-15 ENCOUNTER — Telehealth (INDEPENDENT_AMBULATORY_CARE_PROVIDER_SITE_OTHER): Payer: PPO | Admitting: Registered Nurse

## 2020-06-15 ENCOUNTER — Telehealth: Payer: PPO | Admitting: Family Medicine

## 2020-06-15 ENCOUNTER — Other Ambulatory Visit: Payer: Self-pay

## 2020-06-15 ENCOUNTER — Encounter: Payer: Self-pay | Admitting: Registered Nurse

## 2020-06-15 DIAGNOSIS — R5383 Other fatigue: Secondary | ICD-10-CM | POA: Diagnosis not present

## 2020-06-15 NOTE — Patient Instructions (Signed)
° ° ° °  If you have lab work done today you will be contacted with your lab results within the next 2 weeks.  If you have not heard from us then please contact us. The fastest way to get your results is to register for My Chart. ° ° °IF you received an x-ray today, you will receive an invoice from [facility]. Please contact [facility] at [phone] with questions or concerns regarding your invoice.  ° °IF you received labwork today, you will receive an invoice from [facility]. Please contact [facility] at [phone] with questions or concerns regarding your invoice.  ° °Our billing staff will not be able to assist you with questions regarding bills from these companies. ° °You will be contacted with the lab results as soon as they are available. The fastest way to get your results is to activate your My Chart account. Instructions are located on the last page of this paperwork. If you have not heard from us regarding the results in 2 weeks, please contact this office. °  ° ° ° °

## 2020-06-19 DIAGNOSIS — M5451 Vertebrogenic low back pain: Secondary | ICD-10-CM | POA: Diagnosis not present

## 2020-06-19 DIAGNOSIS — M5432 Sciatica, left side: Secondary | ICD-10-CM | POA: Diagnosis not present

## 2020-06-21 ENCOUNTER — Other Ambulatory Visit: Payer: Self-pay | Admitting: Physician Assistant

## 2020-06-23 DIAGNOSIS — M5432 Sciatica, left side: Secondary | ICD-10-CM | POA: Diagnosis not present

## 2020-06-23 DIAGNOSIS — M5451 Vertebrogenic low back pain: Secondary | ICD-10-CM | POA: Diagnosis not present

## 2020-06-26 DIAGNOSIS — M5432 Sciatica, left side: Secondary | ICD-10-CM | POA: Diagnosis not present

## 2020-06-26 DIAGNOSIS — M5451 Vertebrogenic low back pain: Secondary | ICD-10-CM | POA: Diagnosis not present

## 2020-06-30 DIAGNOSIS — M5432 Sciatica, left side: Secondary | ICD-10-CM | POA: Diagnosis not present

## 2020-06-30 DIAGNOSIS — M5451 Vertebrogenic low back pain: Secondary | ICD-10-CM | POA: Diagnosis not present

## 2020-07-01 ENCOUNTER — Other Ambulatory Visit: Payer: Self-pay

## 2020-07-01 ENCOUNTER — Ambulatory Visit
Admission: RE | Admit: 2020-07-01 | Discharge: 2020-07-01 | Disposition: A | Discharge status: Home / Self Care | Payer: PPO | Source: Ambulatory Visit | Attending: Student | Admitting: Student

## 2020-07-01 DIAGNOSIS — M25552 Pain in left hip: Secondary | ICD-10-CM

## 2020-07-03 DIAGNOSIS — M5432 Sciatica, left side: Secondary | ICD-10-CM | POA: Diagnosis not present

## 2020-07-03 DIAGNOSIS — M5451 Vertebrogenic low back pain: Secondary | ICD-10-CM | POA: Diagnosis not present

## 2020-07-04 ENCOUNTER — Other Ambulatory Visit: Payer: PPO

## 2020-07-06 ENCOUNTER — Other Ambulatory Visit: Payer: Self-pay | Admitting: Orthopedic Surgery

## 2020-07-06 DIAGNOSIS — M5451 Vertebrogenic low back pain: Secondary | ICD-10-CM | POA: Diagnosis not present

## 2020-07-06 DIAGNOSIS — M5432 Sciatica, left side: Secondary | ICD-10-CM | POA: Diagnosis not present

## 2020-07-06 DIAGNOSIS — M25552 Pain in left hip: Secondary | ICD-10-CM

## 2020-07-06 NOTE — Progress Notes (Signed)
We will be cancelling this MRI.

## 2020-07-09 ENCOUNTER — Other Ambulatory Visit (HOSPITAL_COMMUNITY): Payer: Self-pay | Admitting: Orthopedic Surgery

## 2020-07-09 ENCOUNTER — Telehealth: Payer: Self-pay | Admitting: Family Medicine

## 2020-07-09 DIAGNOSIS — M5451 Vertebrogenic low back pain: Secondary | ICD-10-CM | POA: Diagnosis not present

## 2020-07-09 DIAGNOSIS — M5432 Sciatica, left side: Secondary | ICD-10-CM | POA: Diagnosis not present

## 2020-07-09 NOTE — Telephone Encounter (Signed)
Error

## 2020-07-10 ENCOUNTER — Other Ambulatory Visit (HOSPITAL_COMMUNITY): Payer: Self-pay | Admitting: Family Medicine

## 2020-07-10 ENCOUNTER — Telehealth (HOSPITAL_COMMUNITY): Payer: Self-pay

## 2020-07-13 ENCOUNTER — Telehealth: Payer: Self-pay | Admitting: Family Medicine

## 2020-07-13 ENCOUNTER — Other Ambulatory Visit (HOSPITAL_COMMUNITY)
Admission: RE | Admit: 2020-07-13 | Discharge: 2020-07-13 | Disposition: A | Discharge status: Home / Self Care | Payer: PPO | Source: Ambulatory Visit | Attending: Orthopedic Surgery | Admitting: Orthopedic Surgery

## 2020-07-13 ENCOUNTER — Other Ambulatory Visit: Payer: Self-pay

## 2020-07-13 ENCOUNTER — Encounter: Payer: Self-pay | Admitting: Family Medicine

## 2020-07-13 ENCOUNTER — Ambulatory Visit (INDEPENDENT_AMBULATORY_CARE_PROVIDER_SITE_OTHER): Payer: PPO | Admitting: Family Medicine

## 2020-07-13 VITALS — BP 171/74 | HR 88 | Temp 97.0°F | Ht 64.5 in | Wt 202.0 lb

## 2020-07-13 DIAGNOSIS — Z23 Encounter for immunization: Secondary | ICD-10-CM | POA: Diagnosis not present

## 2020-07-13 DIAGNOSIS — Z20822 Contact with and (suspected) exposure to covid-19: Secondary | ICD-10-CM | POA: Insufficient documentation

## 2020-07-13 DIAGNOSIS — I10 Essential (primary) hypertension: Secondary | ICD-10-CM | POA: Diagnosis not present

## 2020-07-13 DIAGNOSIS — Z01818 Encounter for other preprocedural examination: Secondary | ICD-10-CM | POA: Insufficient documentation

## 2020-07-13 DIAGNOSIS — Z79899 Other long term (current) drug therapy: Secondary | ICD-10-CM

## 2020-07-13 LAB — SARS CORONAVIRUS 2 (TAT 6-24 HRS): SARS Coronavirus 2: NEGATIVE

## 2020-07-13 NOTE — Patient Instructions (Addendum)
  I will check into question from radiology but would avoid use of narcotic on morning of procedure as to not combine benzodiazepine and narcotic if they are planning on giving you versed for sedation. I also recommend lower dose of xanax night after procedure if that is needed, and caution with hydrocodone after procedure if any residual sedation.   If you have lab work done today you will be contacted with your lab results within the next 2 weeks.  If you have not heard from [place] then please contact us. The fastest way to get your results is to register for My Chart.   IF you received an x-ray today, you will receive an invoice from [facility]. Please contact [facility] at [phone] with questions or concerns regarding your invoice.   IF you received labwork today, you will receive an invoice from [place]. Please contact [facility] at [phone] with questions or concerns regarding your invoice.   Our billing staff will not be able to assist you with questions regarding bills from these companies.  You will be contacted with the lab results as soon as they are available. The fastest way to get your results is to activate your My Chart account. Instructions are located on the last page of this paperwork. If you have not heard from [place] regarding the results in 2 weeks, please contact this office.

## 2020-07-13 NOTE — Progress Notes (Addendum)
Subjective:  Patient ID: Becky Lawson, female    DOB: 1951-08-10  Age: 69 y.o. MRN: [id number]  CC:  Chief Complaint  Patient presents with  . sedation clearance    Pt needs to get an MRI done. PT is un able to lay flat while MRI is being done and needs to be sedated for procedure.    HPI Becky Lawson presents for   Plan for MRI of left hip.  Will need to lay flat for the MRI and will need to be sedated in order to lay flat for that procedure. 10 am on 07/16/20.  Here for clearance to undergo sedation - unknown what sedation. She is treated by psychiatry, [facility]. some discussion in the past regarding fatigue and possibly related to her psychiatric meds, but no changes given need to continue meds to prevent bipolar from relapsing.  She takes Xanax 1 mg at night. Hydroxyzine 25 mg every 8 hours as needed - not taking recently, Lamictal 200 mgTwice daily, trazodone 300 mg daily, modafinil 100 mg every morning.  Hydrocodone rx by Dr. [name] for hip pain - taking 4 per day.  Aware of risks of combo of benzodiazepine and narcotic. Has narcan at home if needed. Does plan on stopping hydrocodone night before MRI procedure. Last anesthesia for knee replacement 10 years ago - no problems with anesthesia at that time.    History Patient Active Problem List   Diagnosis Date Noted  . Constipation 05/10/2019  . Change in bowel habit 05/10/2019  . Dysphagia 05/10/2019  . Tremor 07/12/2018  . Prediabetes 07/12/2018  . Polypharmacy 07/12/2018  . Unintentional weight loss of more than 10 pounds 07/12/2018  . OSA (obstructive sleep apnea) 06/02/2018  . Fatty liver 04/18/2017  . GERD (gastroesophageal reflux disease) 10/28/2016  . 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 Chronic throat clearing 06/24/2016  . S/P partial thyroidectomy 06/16/2016  . Multinodular goiter 06/02/2016  . Thyroid nodule 02/08/2016  . Hx of hepatitis C 05/19/2015  . Vitamin D deficiency 05/19/2015  . Spinal  stenosis of lumbar region 05/19/2015  . Hyperlipidemia 05/19/2015  . Bipolar disorder ([place]) 05/19/2015  . Essential hypertension 09/26/2013  . Undiagnosed cardiac murmurs 09/26/2013   Past Medical History:  Diagnosis Date  . Anxiety   . Arthritis   . Benign cyst of right kidney 2014   2.7 cm right kidney cyst seen on imaging 2014 but was c/o right flank pain with new persistent proteinuria (fortunately hematuria had resolved) so repeat US 11/2016 showed right kidney still with 2.6 cm simple cyst and otherwise nml.  . Bipolar 1 disorder ([place])   . Cancer ([place])    Phreesia 06/15/2020  . Chronic kidney disease   . Depression   . Depression    Phreesia 06/15/2020  . GERD (gastroesophageal reflux disease)   . Heart murmur   . 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 Hyperlipidemia   . Hypertension   . Jaundice 11/01/2012  . Pruritic disorder 02/13/2013  . Sleep apnea    Was diagnosed approximately 20 years ago, but does not wear CPAP patient stated "I gave it back.Marland KitchenMarland KitchenI couldn't wear that thing it was horrible"  . Spinal stenosis   . Thyroid disease    Phreesia 06/15/2020   Past Surgical History:  Procedure Laterality Date  . ABDOMINAL HYSTERECTOMY    . COLONOSCOPY W/ POLYPECTOMY    . JOINT REPLACEMENT Bilateral    knee  .  KNEE ARTHROSCOPY Bilateral   . PARTIAL HYSTERECTOMY    . THYROID LOBECTOMY Left 06/16/2016  . THYROID LOBECTOMY Left 06/16/2016   Procedure: LEFT THYROID LOBECTOMY;  Surgeon: Greer Pickerel, MD;  Location: [facility];  Service: General;  Laterality: Left;   Allergies  Allergen Reactions  . No Known Allergies    Prior to Admission medications   Medication Sig Start Date End Date Taking? Authorizing Provider  ALPRAZolam Duanne Moron) 1 MG tablet Take 1 tablet (1 mg total) by mouth 2 (two) times daily as needed for anxiety. 05/06/20  Yes Hurst, Teresa T, PA-C  amLODipine (NORVASC) 5 MG tablet TAKE 1 TABLET BY MOUTH EVERY DAY  03/30/20  Yes Wendie Agreste, MD  Cholecalciferol (VITAMIN D) 125 MCG (5000 UT) CAPS Take by mouth.   Yes [provider]  Cholecalciferol (VITAMIN D-3) 5000 units TABS Take 5,000 Units daily by mouth. In addition to the rx high dose once weekly vitamin D of 50Ku. Patient taking differently: Take 2,000 Units by mouth daily. In addition to the rx high dose once weekly vitamin D of 50Ku. 07/24/17  Yes Shawnee Knapp, MD  Coenzyme Q10 (CO Q-10) 300 MG CAPS Take 1 capsule daily by mouth.   Yes [provider]  docusate sodium (COLACE) 100 MG capsule Take 1 capsule (100 mg total) by mouth 2 (two) times daily. 05/13/17  Yes Shawnee Knapp, MD  DULoxetine (CYMBALTA) 60 MG capsule TAKE 1 CAPSULE BY MOUTH EVERY DAY 05/27/20  Yes Hurst, Teresa T, PA-C  HYDROcodone-acetaminophen (NORCO) 10-325 MG tablet Take 1 tablet by mouth 3 (three) times daily.    Yes [provider]  hydrOXYzine (ATARAX/VISTARIL) 25 MG tablet Take 1 tablet (25 mg total) by mouth every 8 (eight) hours as needed. 06/04/20  Yes Hurst, Helene Kelp T, PA-C  lamoTRIgine (LAMICTAL) 200 MG tablet TAKE 1 TABLET BY MOUTH TWICE A DAY 05/28/20  Yes Hurst, Teresa T, PA-C  levothyroxine (SYNTHROID) 100 MCG tablet TAKE 1 TABLET BY MOUTH DAILY BEFORE BREAKFAST. 04/16/20  Yes Wendie Agreste, MD  magnesium oxide (MAG-OX) 400 MG tablet Take 400 mg by mouth daily.   Yes [provider]  modafinil (PROVIGIL) 200 MG tablet Take 0.5 tablets (100 mg total) by mouth in the morning. 06/04/20  Yes Hurst, Helene Kelp T, PA-C  Omega-3 Fatty Acids (FISH OIL) 1200 MG CAPS Take by mouth.   Yes [provider]  omeprazole (PRILOSEC) 40 MG capsule TAKE 1 CAPSULE BY MOUTH TWICE A DAY 04/02/20  Yes Mansouraty, Telford Nab., MD  pravastatin (PRAVACHOL) 40 MG tablet Take 1 tablet (40 mg total) by mouth daily. 09/30/19  Yes Wendie Agreste, MD  traZODone (DESYREL) 100 MG tablet Take 3 tablets (300 mg total) by mouth daily. 06/04/20  Yes Hurst, Helene Kelp T,  PA-C  Vitamin D, Ergocalciferol, (DRISDOL) 1.25 MG (50000 UNIT) CAPS capsule Take 1 capsule (50,000 Units total) by mouth every 7 (seven) days. 09/30/19  Yes Wendie Agreste, MD  vitamin E 600 UNIT capsule Take 600 Units by mouth daily.   Yes [provider]  carbidopa-levodopa (SINEMET IR) 25-100 MG tablet TAKE 1 TABLET BY MOUTH EVERYDAY AT BEDTIME Patient not taking: Reported on 07/13/2020 08/21/18   Shawnee Knapp, MD   Social History   Socioeconomic History  . Marital status: Divorced    Spouse name: Not on file  . Number of children: Not on file  . Years of education: Not on file  . Highest education level: Not on file  Occupational History  . Occupation: Geophysical data processor  Tobacco Use  . Smoking status: Former Smoker    Packs/day: 1.00    Years: 15.00    Pack years: 15.00    Quit date: 06/30/1979    Years since quitting: 41.0  . Smokeless tobacco: Never Used  Vaping Use  . Vaping Use: Never used  Substance and Sexual Activity  . Alcohol use: Yes    Alcohol/week: 0.0 - 1.0 standard drinks    Comment: rare  . Drug use: No  . Sexual activity: Yes    Birth control/protection: None  Other Topics Concern  . Not on file  Social History Narrative   Divorced. Education: [employer]. Exercise: No.   Social Determinants of Health   Financial Resource Strain:   . Difficulty of Paying Living Expenses: Not on file  Food Insecurity:   . Worried About Charity fundraiser in the Last Year: Not on file  . Ran Out of Food in the Last Year: Not on file  Transportation Needs:   . Lack of Transportation (Medical): Not on file  . Lack of Transportation (Non-Medical): Not on file  Physical Activity:   . Days of Exercise per Week: Not on file  . Minutes of Exercise per Session: Not on file  Stress:   . Feeling of Stress : Not on file  Social Connections:   . Frequency of Communication with Friends and Family: Not on file  . Frequency of Social Gatherings with Friends and  Family: Not on file  . Attends Religious Services: Not on file  . Active Member of Clubs or Organizations: Not on file  . Attends Archivist Meetings: Not on file  . Marital Status: Not on file  Intimate Partner Violence:   . Fear of Current or Ex-Partner: Not on file  . Emotionally Abused: Not on file  . Physically Abused: Not on file  . Sexually Abused: Not on file    Review of Systems Per HPI   Objective:   Vitals:   07/13/20 0958  BP: (!) 171/74  Pulse: 88  Temp: (!) 97 F (36.1 C)  TempSrc: Temporal  SpO2: 99%  Weight: 202 lb (91.6 kg)  Height: 5' 4.5" (1.638 m)     Physical Exam Vitals reviewed.  Constitutional:      Appearance: She is well-developed.  HENT:     Head: Normocephalic and atraumatic.  Eyes:     Conjunctiva/sclera: Conjunctivae normal.     Pupils: Pupils are equal, round, and reactive to light.  Neck:     Vascular: No carotid bruit.  Cardiovascular:     Rate and Rhythm: Normal rate and regular rhythm.     Heart sounds: Normal heart sounds.  Pulmonary:     Effort: Pulmonary effort is normal.     Breath sounds: Normal breath sounds.  Abdominal:     Palpations: Abdomen is soft. There is no pulsatile mass.     Tenderness: There is no abdominal tenderness.  Skin:    General: Skin is warm and dry.  Neurological:     Mental Status: She is alert and oriented to person, place, and time.  Psychiatric:        Behavior: Behavior normal.    EKG sinus rhythm, rate 78, no acute findings, compared to 01/29/2017.  Assessment & Plan:  Becky Lawson is a 69 y.o. female . High risk medication use - Plan: EKG 12-Lead  Need for vaccination - Plan: Flu Vaccine QUAD High Dose(Fluad)  Essential hypertension - Plan: EKG 12-Lead  Encounter for medication review - Plan: EKG 12-Lead  Plan for MRI, appears she may be either sedated with Versed versus general anesthesia.  Will need to clarify with radiology - waiting on return call.  If general  anesthesia, then further questions will need to be discussed for preoperative evaluation.  Primary concern regarding her medications would be combination of benzodiazepines and narcotics and discussed plan for avoiding that combination around time of procedure if she were to be given a benzodiazepine.   10:40 AM 07/15/20: Called radiology, plan was for general anesthesia, but MRI was cancelled by pt due to cost. If that is rescheduled, will need repeat discussion including further questions for preop eval for general anesthesia  No orders of the defined types were placed in this encounter.  Patient Instructions    I will check into question from radiology but would avoid use of narcotic on morning of procedure as to not combine benzodiazepine and narcotic if they are planning on giving you versed for sedation. I also recommend lower dose of xanax night after procedure if that is needed, and caution with hydrocodone after procedure if any residual sedation.   If you have lab work done today you will be contacted with your lab results within the next 2 weeks.  If you have not heard from [place] then please contact us. The fastest way to get your results is to register for My Chart.   IF you received an x-ray today, you will receive an invoice from [facility]. Please contact [facility] at [phone] with questions or concerns regarding your invoice.   IF you received labwork today, you will receive an invoice from [place]. Please contact [facility] at [phone] with questions or concerns regarding your invoice.   Our billing staff will not be able to assist you with questions regarding bills from these companies.  You will be contacted with the lab results as soon as they are available. The fastest way to get your results is to activate your My Chart account. Instructions are located on the last page of this paperwork. If you have not heard from [place] regarding the results in 2  weeks, please contact this office.         Signed, Merri Ray, MD [facility]

## 2020-07-13 NOTE — Telephone Encounter (Signed)
Noted  

## 2020-07-13 NOTE — Telephone Encounter (Signed)
Patient called to confirm that we had received paperwork to be completed by Dr.Greene and faxed back to [facility] by 5:00 today/paperwork received and  Placed paperwork in Provider/CMA mail box at nurses station

## 2020-07-14 ENCOUNTER — Encounter: Payer: Self-pay | Admitting: Family Medicine

## 2020-07-15 ENCOUNTER — Telehealth: Payer: Self-pay

## 2020-07-15 ENCOUNTER — Encounter: Payer: Self-pay | Admitting: Physician Assistant

## 2020-07-15 ENCOUNTER — Telehealth (INDEPENDENT_AMBULATORY_CARE_PROVIDER_SITE_OTHER): Payer: PPO | Admitting: Physician Assistant

## 2020-07-15 ENCOUNTER — Telehealth: Payer: Self-pay | Admitting: Physician Assistant

## 2020-07-15 DIAGNOSIS — F99 Mental disorder, not otherwise specified: Secondary | ICD-10-CM

## 2020-07-15 DIAGNOSIS — F3175 Bipolar disorder, in partial remission, most recent episode depressed: Secondary | ICD-10-CM | POA: Diagnosis not present

## 2020-07-15 DIAGNOSIS — R5383 Other fatigue: Secondary | ICD-10-CM | POA: Diagnosis not present

## 2020-07-15 DIAGNOSIS — F5105 Insomnia due to other mental disorder: Secondary | ICD-10-CM | POA: Diagnosis not present

## 2020-07-15 DIAGNOSIS — F411 Generalized anxiety disorder: Secondary | ICD-10-CM | POA: Diagnosis not present

## 2020-07-15 NOTE — Progress Notes (Signed)
[facility]  Patient ID: Becky Lawson,  MRN: [id number]  PCP: Wendie Agreste, MD  Date of Evaluation: 07/15/2020 Time spent:20 minutes  Chief Complaint:  Chief Complaint    Anxiety; Depression; Insomnia; Follow-up     Virtual Visit via Telehealth  I connected with patient by telephone, with their informed consent, and verified patient privacy and that I am speaking with the correct person using two identifiers.  I am private, in my office and the patient is at home.  I discussed the limitations, risks, security and privacy concerns of performing an evaluation and management service by telephone (she has no access to a computer to do video visit) and the availability of in person appointments. I also discussed with the patient that there may be a patient responsible charge related to this service. The patient expressed understanding and agreed to proceed.   I discussed the assessment and treatment plan with the patient. The patient was provided an opportunity to ask questions and all were answered. The patient agreed with the plan and demonstrated an understanding of the instructions.   The patient was advised to call back or seek an in-person evaluation if the symptoms worsen or if the condition fails to improve as anticipated.  I provided 20 minutes of non-face-to-face time during this encounter.  HISTORY/CURRENT STATUS: HPI For routine f/u.   At the last visit 6 weeks ago we started modafinil for extreme fatigue.  States it has not really helped much and ask if we can increase the dose.  States she does not feel like getting out of a chair and doing anything.    But since she was here the pain she had been having in her hip has gotten worse and worse.  She is on opiates for the pain now and her orthopedist has recommended hip replacement due to arthritis.  States the pain is excruciating.  She had a fall back in August and there may be a hairline fracture.  An MRI was  attempted but because of the severe pain, she was not able to lay on the table long enough to have the procedure done.  An open MRI has been ordered with sedation but her insurance has not approved it.  States she does not feel depressed. She is able to enjoy the computer games. She does not isolate. Does not cry easily. No suicidal or homicidal thoughts.  The anxiety is still a problem sometimes.  She is only taking the Xanax at night.  If she does not take it, she has racing thoughts and has a hard time going to sleep..  Patient denies increased energy with decreased need for sleep, no increased talkativeness, no racing thoughts, no impulsivity or risky behaviors, no increased spending, no increased libido, no grandiosity.  Denies dizziness, syncope, seizures, numbness, tingling, tics, unsteady gait, slurred speech, confusion.   Individual Medical History/ Review of Systems: Changes? :Yes   See above  Past medications for mental health diagnoses include: Lithium, Seroquel, Effexor, Prozac, Latuda, Abilify,Dexedrine.  Allergies: No known allergies  Current Medications:  Current Outpatient Medications:  .  ALPRAZolam (XANAX) 1 MG tablet, Take 1 tablet (1 mg total) by mouth 2 (two) times daily as needed for anxiety. (Patient taking differently: Take 1 mg by mouth at bedtime. ), Disp: 130 tablet, Rfl: 0 .  amLODipine (NORVASC) 5 MG tablet, TAKE 1 TABLET BY MOUTH EVERY DAY (Patient taking differently: Take 5 mg by mouth daily. ), Disp: 90 tablet, Rfl: 1 .  carbidopa-levodopa (SINEMET IR) 25-100 MG tablet, TAKE 1 TABLET BY MOUTH EVERYDAY AT BEDTIME, Disp: 90 tablet, Rfl: 1 .  Cholecalciferol (VITAMIN D-3) 5000 units TABS, Take 5,000 Units daily by mouth. In addition to the rx high dose once weekly vitamin D of 50Ku., Disp: 30 tablet, Rfl:  .  docusate sodium (COLACE) 100 MG capsule, Take 1 capsule (100 mg total) by mouth 2 (two) times daily., Disp: 10 capsule, Rfl: 0 .  HYDROcodone-acetaminophen  (NORCO) 10-325 MG tablet, Take 1 tablet by mouth 3 (three) times daily. , Disp: , Rfl:  .  hydrOXYzine (ATARAX/VISTARIL) 25 MG tablet, Take 1 tablet (25 mg total) by mouth every 8 (eight) hours as needed. (Patient taking differently: Take 25 mg by mouth every 8 (eight) hours as needed for anxiety or itching. ), Disp: 60 tablet, Rfl: 5 .  ibuprofen (ADVIL) 800 MG tablet, Take 800 mg by mouth daily., Disp: , Rfl:  .  lamoTRIgine (LAMICTAL) 200 MG tablet, TAKE 1 TABLET BY MOUTH TWICE A DAY (Patient taking differently: Take 200 mg by mouth 2 (two) times daily. ), Disp: 180 tablet, Rfl: 0 .  levothyroxine (SYNTHROID) 100 MCG tablet, TAKE 1 TABLET BY MOUTH DAILY BEFORE BREAKFAST. (Patient taking differently: Take 100 mcg by mouth daily before breakfast. ), Disp: 90 tablet, Rfl: 1 .  modafinil (PROVIGIL) 200 MG tablet, Take 0.5 tablets (100 mg total) by mouth in the morning., Disp: 30 tablet, Rfl: 1 .  omeprazole (PRILOSEC) 40 MG capsule, TAKE 1 CAPSULE BY MOUTH TWICE A DAY (Patient taking differently: Take 40 mg by mouth at bedtime. Take additional 40 mg if needed and noon), Disp: 180 capsule, Rfl: 1 .  pravastatin (PRAVACHOL) 40 MG tablet, Take 1 tablet (40 mg total) by mouth daily., Disp: 90 tablet, Rfl: 3 .  traZODone (DESYREL) 100 MG tablet, Take 3 tablets (300 mg total) by mouth daily. (Patient taking differently: Take 300 mg by mouth at bedtime. ), Disp: 270 tablet, Rfl: 3 .  Vitamin D, Ergocalciferol, (DRISDOL) 1.25 MG (50000 UNIT) CAPS capsule, Take 1 capsule (50,000 Units total) by mouth every 7 (seven) days. (Patient taking differently: Take 50,000 Units by mouth every 7 (seven) days. Sunday), Disp: 12 capsule, Rfl: 3 .  vitamin E 180 MG (400 UNITS) capsule, Take 400 Units by mouth daily., Disp: , Rfl:   Current Facility-Administered Medications:  .  cyanocobalamin ((VITAMIN B-12)) injection 1,000 mcg, 1,000 mcg, Intramuscular, Q30 days, Shawnee Knapp, MD, 1,000 mcg at 07/12/18 1652 Medication Side  Effects: none  Family Medical/ Social History: Changes? No  MENTAL HEALTH EXAM:  There were no vitals taken for this visit.There is no height or weight on file to calculate BMI.  General Appearance: Unable to assess  Eye Contact:  Unable to assess  Speech:  Clear and Coherent and Normal Rate  Volume:  Normal  Mood:  Euthymic  Affect:  Unable to assess  Thought Process:  Goal Directed and Descriptions of Associations: Intact  Orientation:  Full (Time, Place, and Person)  Thought Content: Logical   Suicidal Thoughts:  No  Homicidal Thoughts:  No  Memory:  WNL  Judgement:  Good  Insight:  Good  Psychomotor Activity:  Unable to assess  Concentration:  Concentration: Good  Recall:  Good  Fund of Knowledge: Good  Language: Good  Assets:  Desire for Improvement  ADL's:  Intact  Cognition: WNL  Prognosis:  Good    DIAGNOSES:    ICD-10-CM   1. Bipolar disorder, in partial  remission, most recent episode depressed ([place])  F31.75   2. Generalized anxiety disorder  F41.1   3. Fatigue, unspecified type  R53.83   4. Insomnia due to other mental disorder  F51.05    F99     Receiving Psychotherapy: No    RECOMMENDATIONS:  PDMP was reviewed. I provided 20 minutes of non face-to-face time during this encounter.  She ask about increasing the modafinil.  That is not a good idea at this time.  It can increase energy and cause anxiety/agitation.  And since she is unable to move without being in pain, I do not want her to be more agitated and not have the ability to get out of the chair and do things.  Then she told me "I will just sit here and be tired and do nothing like I always do.  I am able to get up for 15 to 20 minutes at a time with the pain not being too bad."  I am still denying the increase of modafinil.  At least some of the fatigue could be coming from the Xanax which she only takes at night, hydrocodone, and the pain.  We can readdress this at her next appointment.  At least she  will know more of what is going on with the hip, whether she really needs surgery or not, etc. Continue modafinil 200 mg, one half p.o. every morning. Continue Xanax 1 mg, 1 p.o. nightly as needed. Continue Cymbalta 60 mg 1 p.o. daily. Continue hydroxyzine 25 mg, 1-2 every 6 hours as needed anxiety. Continue Lamictal 200 mg, 1 p.o. twice daily. Continue trazodone 100 mg, 3 p.o. nightly.  As needed Return in 2 months.   Donnal Moat, PA-C

## 2020-07-15 NOTE — Telephone Encounter (Signed)
[name], Becky Lawson are scheduled for a virtual visit with your provider today.    Just as we do with appointments in the office, we must obtain your consent to participate.  Your consent will be active for this visit and any virtual visit you may have with one of our providers in the next 365 days.    If you have a MyChart account, I can also send a copy of this consent to you electronically.  All virtual visits are billed to your insurance company just like a traditional visit in the office.  As this is a virtual visit, video technology does not allow for your provider to perform a traditional examination.  This may limit your provider's ability to fully assess your condition.  If your provider identifies any concerns that need to be evaluated in person or the need to arrange testing such as labs, EKG, etc, we will make arrangements to do so.    Although advances in technology are sophisticated, we cannot ensure that it will always work on either your end or our end.  If the connection with a video visit is poor, we may have to switch to a telephone visit.  With either a video or telephone visit, we are not always able to ensure that we have a secure connection.   I need to obtain your verbal consent now.   Are you willing to proceed with your visit today?   Becky Lawson has provided verbal consent on 07/15/2020 for a virtual visit (video or telephone).   Donnal Moat, PA-C 07/15/2020  2:34 PM

## 2020-07-15 NOTE — Telephone Encounter (Signed)
Called pt to inform her on dr. Vonna Kotyk behalf that if she changes her mind regarding the MRI sedation. To call and make an appt. The provider stated he was ok with either in person or virtual appt. I also informed the pt that if she chooses to do a virtual appt that we would have her come in for a nurse visit for blood work and possible x-ray. Pt seemed to under stand this information and stated she will get back to [place] if needed.

## 2020-07-16 ENCOUNTER — Encounter (HOSPITAL_COMMUNITY): Admission: RE | Payer: Self-pay | Source: Home / Self Care

## 2020-07-16 ENCOUNTER — Ambulatory Visit (HOSPITAL_COMMUNITY): Admission: RE | Admit: 2020-07-16 | Payer: PPO | Source: Home / Self Care

## 2020-07-16 ENCOUNTER — Ambulatory Visit: Payer: PPO | Admitting: Physician Assistant

## 2020-07-16 ENCOUNTER — Encounter (HOSPITAL_COMMUNITY): Payer: Self-pay

## 2020-07-16 ENCOUNTER — Ambulatory Visit (HOSPITAL_COMMUNITY): Payer: PPO

## 2020-07-16 SURGERY — MRI WITH ANESTHESIA
Anesthesia: General | Laterality: Left

## 2020-07-22 DIAGNOSIS — M160 Bilateral primary osteoarthritis of hip: Secondary | ICD-10-CM | POA: Diagnosis not present

## 2020-07-22 DIAGNOSIS — M1612 Unilateral primary osteoarthritis, left hip: Secondary | ICD-10-CM | POA: Diagnosis not present

## 2020-07-29 ENCOUNTER — Other Ambulatory Visit: Payer: Self-pay

## 2020-07-29 ENCOUNTER — Telehealth: Payer: Self-pay | Admitting: Family Medicine

## 2020-07-29 DIAGNOSIS — I10 Essential (primary) hypertension: Secondary | ICD-10-CM

## 2020-07-29 DIAGNOSIS — E890 Postprocedural hypothyroidism: Secondary | ICD-10-CM

## 2020-07-29 MED ORDER — OMEPRAZOLE 40 MG PO CPDR
40.0000 mg | DELAYED_RELEASE_CAPSULE | Freq: Two times a day (BID) | ORAL | 1 refills | Status: DC
Start: 2020-07-29 — End: 2020-09-22

## 2020-07-29 MED ORDER — LEVOTHYROXINE SODIUM 100 MCG PO TABS: ORAL_TABLET | ORAL | 1 refills | Status: DC

## 2020-07-29 MED ORDER — AMLODIPINE BESYLATE 5 MG PO TABS: 5.0000 mg | ORAL_TABLET | Freq: Every day | ORAL | 1 refills | Status: DC

## 2020-07-29 MED ORDER — LAMOTRIGINE 200 MG PO TABS
200.0000 mg | ORAL_TABLET | Freq: Two times a day (BID) | ORAL | 0 refills | Status: DC
Start: 2020-07-29 — End: 2020-10-06

## 2020-07-29 MED ORDER — ALPRAZOLAM 1 MG PO TABS: 1.0000 mg | ORAL_TABLET | Freq: Two times a day (BID) | ORAL | 0 refills | Status: DC | PRN

## 2020-07-29 NOTE — Telephone Encounter (Signed)
She is followed by psychiatrist, called pt, has meds and she plans to call pharmacy as refill sent to me in error.

## 2020-07-29 NOTE — Telephone Encounter (Signed)
Patient is requesting a refill of the following medications: Requested Prescriptions    No prescriptions requested or ordered in this encounter  Trazadone   Date of patient request: 07/29/2020 Last office visit: 07/13/2020 Date of last refill: 06/04/2020 Last refill amount: 270 tablets

## 2020-07-29 NOTE — Telephone Encounter (Signed)
Medication Refill - Medication: amLODipine (NORVASC) 5 MG tablet  lamoTRIgine (LAMICTAL) 200 MG tablet  levothyroxine (SYNTHROID) 100 MCG  omeprazole (PRILOSEC) 40 MG capsule   pravastatin (PRAVACHOL) 40 MG tablet  traZODone (DESYREL) 100 MG tablet  Vitamin D, Ergocalciferol, (DRISDOL) 1.25 MG (50000 UNIT) CAPS capsule   Has the patient contacted their pharmacy? Yes.   (Agent: If no, request that the patient contact the pharmacy for the refill.) (Agent: If yes, when and what did the pharmacy advise?)  Preferred Pharmacy (with phone number or street name):  [facility] [address], [place] Phone:  [phone]  Fax:  [phone]       Agent: Please be advised that RX refills may take up to 3 business days. We ask that you follow-up with your pharmacy.

## 2020-08-03 ENCOUNTER — Other Ambulatory Visit: Payer: Self-pay

## 2020-08-03 DIAGNOSIS — R7989 Other specified abnormal findings of blood chemistry: Secondary | ICD-10-CM

## 2020-08-03 DIAGNOSIS — E890 Postprocedural hypothyroidism: Secondary | ICD-10-CM

## 2020-08-03 DIAGNOSIS — E785 Hyperlipidemia, unspecified: Secondary | ICD-10-CM

## 2020-08-03 MED ORDER — LEVOTHYROXINE SODIUM 100 MCG PO TABS: ORAL_TABLET | ORAL | 1 refills | Status: DC

## 2020-08-03 MED ORDER — VITAMIN D (ERGOCALCIFEROL) 1.25 MG (50000 UNIT) PO CAPS: 50000.0000 [IU] | ORAL_CAPSULE | ORAL | 3 refills | Status: DC

## 2020-08-03 MED ORDER — PRAVASTATIN SODIUM 40 MG PO TABS: 40.0000 mg | ORAL_TABLET | Freq: Every day | ORAL | 3 refills | Status: DC

## 2020-08-03 NOTE — Telephone Encounter (Signed)
Pt needs refill of medications listed below to start with [employer] pill pack. I have pended them all so they all ar sent at the same time.  Requested to have Vitamin D refilled as well level was 55.0 on 04/30/2020 would you like her to continue high dose?  Patient is requesting a refill of the following medications: Requested Prescriptions   Pending Prescriptions Disp Refills   traZODone (DESYREL) 100 MG tablet 270 tablet 3    Sig: Take 3 tablets (300 mg total) by mouth at bedtime.   pravastatin (PRAVACHOL) 40 MG tablet 90 tablet 3    Sig: Take 1 tablet (40 mg total) by mouth daily.   levothyroxine (SYNTHROID) 100 MCG tablet 90 tablet 1    Sig: TAKE 1 TABLET BY MOUTH DAILY BEFORE BREAKFAST.   Vitamin D, Ergocalciferol, (DRISDOL) 1.25 MG (50000 UNIT) CAPS capsule 12 capsule 3    Sig: Take 1 capsule (50,000 Units total) by mouth every 7 (seven) days. Sunday   Documented fill dates are for Trazodone specifically   Date of patient request: 08/03/2020 Last office visit: 07/13/2020 Date of last refill: 06/04/2020 Last refill amount: 270 x3 refill

## 2020-08-07 ENCOUNTER — Other Ambulatory Visit: Payer: Self-pay | Admitting: Physician Assistant

## 2020-08-07 DIAGNOSIS — G894 Chronic pain syndrome: Secondary | ICD-10-CM | POA: Diagnosis not present

## 2020-08-10 ENCOUNTER — Other Ambulatory Visit: Payer: Self-pay | Admitting: Physical Medicine and Rehabilitation

## 2020-08-10 DIAGNOSIS — G894 Chronic pain syndrome: Secondary | ICD-10-CM

## 2020-08-11 ENCOUNTER — Telehealth: Payer: Self-pay

## 2020-08-13 ENCOUNTER — Encounter: Payer: Self-pay | Admitting: Registered Nurse

## 2020-08-13 DIAGNOSIS — Z79891 Long term (current) use of opiate analgesic: Secondary | ICD-10-CM | POA: Diagnosis not present

## 2020-08-13 DIAGNOSIS — M5136 Other intervertebral disc degeneration, lumbar region: Secondary | ICD-10-CM | POA: Diagnosis not present

## 2020-08-13 DIAGNOSIS — M5416 Radiculopathy, lumbar region: Secondary | ICD-10-CM | POA: Diagnosis not present

## 2020-08-13 NOTE — Progress Notes (Signed)
Telemedicine Encounter- SOAP NOTE Established Patient  This telephone encounter was conducted with the patient's (or proxy's) verbal consent via audio telecommunications: yes  Patient was instructed to have this encounter in a suitably private space; and to only have persons present to whom they give permission to participate. In addition, patient identity was confirmed by use of name plus two identifiers (DOB and address).  I discussed the limitations, risks, security and privacy concerns of performing an evaluation and management service by telephone and the availability of in person appointments. I also discussed with the patient that there may be a patient responsible charge related to this service. The patient expressed understanding and agreed to proceed.  I spent a total of 15 minutes talking with the patient or their proxy.  Patient at home Provider in office  Chief Complaint  Patient presents with  . Follow-up    patient states visit is to follow up about chronic fatigue. Per patient she can still barely get up out the bed shes so tired.    Subjective   Becky Lawson is a 69 y.o. established patient. Telephone visit today for fatigue  HPI Ongoing despite vitamin d supplementation Feeling at this time she can barely get out of bed On a number of medications that may contribute to this but upon chart review none are new or changed over the course of her fatigue No new or changing neuro or msk symptoms since previous visit with pcp dr [name] No weight changes or GI concerns Intake appropriate per pt  Discussed visit with neurology - pt has yet to have this visit  Patient Active Problem List   Diagnosis Date Noted  . Constipation 05/10/2019  . Change in bowel habit 05/10/2019  . Dysphagia 05/10/2019  . Tremor 07/12/2018  . Prediabetes 07/12/2018  . Polypharmacy 07/12/2018  . Unintentional weight loss of more than 10 pounds 07/12/2018  . OSA (obstructive sleep apnea)  06/02/2018  . Fatty liver 04/18/2017  . GERD (gastroesophageal reflux disease) 10/28/2016  . 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 Chronic throat clearing 06/24/2016  . S/P partial thyroidectomy 06/16/2016  . Multinodular goiter 06/02/2016  . Thyroid nodule 02/08/2016  . Hx of hepatitis C 05/19/2015  . Vitamin D deficiency 05/19/2015  . Spinal stenosis of lumbar region 05/19/2015  . Hyperlipidemia 05/19/2015  . Bipolar disorder ([place]) 05/19/2015  . Essential hypertension 09/26/2013  . Undiagnosed cardiac murmurs 09/26/2013    Past Medical History:  Diagnosis Date  . Anxiety   . Arthritis   . Benign cyst of right kidney 2014   2.7 cm right kidney cyst seen on imaging 2014 but was c/o right flank pain with new persistent proteinuria (fortunately hematuria had resolved) so repeat US 11/2016 showed right kidney still with 2.6 cm simple cyst and otherwise nml.  . Bipolar 1 disorder ([place])   . Cancer ([place])    [employer]reesia 06/15/2020  . Chronic kidney disease   . Depression   . Depression    [employer]reesia 06/15/2020  . GERD (gastroesophageal reflux disease)   . Heart murmur   . 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 Hyperlipidemia   . Hypertension   . Jaundice 11/01/2012  . Pruritic disorder 02/13/2013  . Sleep apnea    Was diagnosed approximately 20 years ago, but does not wear CPAP patient stated "I gave it back.Marland KitchenMarland KitchenI couldn't wear that thing it was horrible"  . Spinal stenosis   .  Thyroid disease    [employer]reesia 06/15/2020    Current Outpatient Medications  Medication Sig Dispense Refill  . carbidopa-levodopa (SINEMET IR) 25-100 MG tablet TAKE 1 TABLET BY MOUTH EVERYDAY AT BEDTIME 90 tablet 1  . Cholecalciferol (VITAMIN D-3) 5000 units TABS Take 5,000 Units daily by mouth. In addition to the rx high dose once weekly vitamin D of 50Ku. 30 tablet   . docusate sodium (COLACE) 100 MG capsule Take 1 capsule (100 mg total) by mouth 2 (two)  times daily. 10 capsule 0  . HYDROcodone-acetaminophen (NORCO) 10-325 MG tablet Take 1 tablet by mouth 3 (three) times daily.     . hydrOXYzine (ATARAX/VISTARIL) 25 MG tablet Take 1 tablet (25 mg total) by mouth every 8 (eight) hours as needed. (Patient taking differently: Take 25 mg by mouth every 8 (eight) hours as needed for anxiety or itching. ) 60 tablet 5  . modafinil (PROVIGIL) 200 MG tablet Take 0.5 tablets (100 mg total) by mouth in the morning. 30 tablet 1  . traZODone (DESYREL) 100 MG tablet Take 3 tablets (300 mg total) by mouth daily. (Patient taking differently: Take 300 mg by mouth at bedtime. ) 270 tablet 3  . ALPRAZolam (XANAX) 1 MG tablet Take 1 tablet (1 mg total) by mouth 2 (two) times daily as needed for anxiety. 130 tablet 0  . amLODipine (NORVASC) 5 MG tablet Take 1 tablet (5 mg total) by mouth daily. 90 tablet 1  . ibuprofen (ADVIL) 800 MG tablet Take 800 mg by mouth daily.    [name] lamoTRIgine (LAMICTAL) 200 MG tablet Take 1 tablet (200 mg total) by mouth 2 (two) times daily. 180 tablet 0  . levothyroxine (SYNTHROID) 100 MCG tablet TAKE 1 TABLET BY MOUTH DAILY BEFORE BREAKFAST. 90 tablet 1  . omeprazole (PRILOSEC) 40 MG capsule Take 1 capsule (40 mg total) by mouth 2 (two) times daily. 180 capsule 1  . pravastatin (PRAVACHOL) 40 MG tablet Take 1 tablet (40 mg total) by mouth daily. 90 tablet 3  . Vitamin D, Ergocalciferol, (DRISDOL) 1.25 MG (50000 UNIT) CAPS capsule Take 1 capsule (50,000 Units total) by mouth every 7 (seven) days. Sunday 12 capsule 3  . vitamin E 180 MG (400 UNITS) capsule Take 400 Units by mouth daily.     Current Facility-Administered Medications  Medication Dose Route Frequency Provider Last Rate Last Admin  . cyanocobalamin ((VITAMIN B-12)) injection 1,000 mcg  1,000 mcg Intramuscular Q30 days Shawnee Knapp, MD   1,000 mcg at 07/12/18 1652    No Known Allergies  Social History   Socioeconomic History  . Marital status: Divorced    Spouse name: Not on  file  . Number of children: Not on file  . Years of education: Not on file  . Highest education level: Not on file  Occupational History  . Occupation: Geophysical data processor  Tobacco Use  . Smoking status: Former Smoker    Packs/day: 1.00    Years: 15.00    Pack years: 15.00    Quit date: 06/30/1979    Years since quitting: 41.1  . Smokeless tobacco: Never Used  Vaping Use  . Vaping Use: Never used  Substance and Sexual Activity  . Alcohol use: Yes    Alcohol/week: 0.0 - 1.0 standard drinks    Comment: rare  . Drug use: No  . Sexual activity: Yes    Birth control/protection: None  Other Topics Concern  . Not on file  Social History Narrative   Divorced. Education: [employer].  Exercise: No.   Social Determinants of Health   Financial Resource Strain: Not on file  Food Insecurity: Not on file  Transportation Needs: Not on file  Physical Activity: Not on file  Stress: Not on file  Social Connections: Not on file  Intimate Partner Violence: Not on file    Review of Systems  Constitutional: Positive for malaise/fatigue. Negative for chills, diaphoresis, fever and weight loss.  HENT: Negative.   Eyes: Negative.   Respiratory: Negative.   Cardiovascular: Negative.   Gastrointestinal: Negative.   Genitourinary: Negative.   Musculoskeletal: Negative.   Skin: Negative.   Neurological: Negative.   Endo/Heme/Allergies: Negative.   Psychiatric/Behavioral: Negative.     Objective   Vitals as reported by the patient: There were no vitals filed for this visit.  Dayla was seen today for follow-up.  Diagnoses and all orders for this visit:  Other fatigue   PLAN  Unfortunately I am not sure what else I can offer Ms. Parkerson at this time   Agree with her PCP [name] that a neuro eval is needed to rule out neuro disease affecting her symptoms before we can proceed further  Discussed nonpharm for fatigue with patient and identified areas where patient can improve  Follow  up in 6 weeks, contact sooner if any trouble with getting to see neuro  Patient encouraged to call clinic with any questions, comments, or concerns.   I discussed the assessment and treatment plan with the patient. The patient was provided an opportunity to ask questions and all were answered. The patient agreed with the plan and demonstrated an understanding of the instructions.   The patient was advised to call back or seek an in-person evaluation if the symptoms worsen or if the condition fails to improve as anticipated.  I provided 15 minutes of non-face-to-face time during this encounter.  Maximiano Coss, NP  Primary Care at [facility]

## 2020-08-19 ENCOUNTER — Other Ambulatory Visit: Payer: Self-pay

## 2020-08-19 ENCOUNTER — Ambulatory Visit (INDEPENDENT_AMBULATORY_CARE_PROVIDER_SITE_OTHER): Payer: PPO | Admitting: Physician Assistant

## 2020-08-19 ENCOUNTER — Encounter: Payer: Self-pay | Admitting: Physician Assistant

## 2020-08-19 DIAGNOSIS — R5383 Other fatigue: Secondary | ICD-10-CM | POA: Diagnosis not present

## 2020-08-19 DIAGNOSIS — F411 Generalized anxiety disorder: Secondary | ICD-10-CM | POA: Diagnosis not present

## 2020-08-19 DIAGNOSIS — F3175 Bipolar disorder, in partial remission, most recent episode depressed: Secondary | ICD-10-CM

## 2020-08-19 DIAGNOSIS — M25552 Pain in left hip: Secondary | ICD-10-CM | POA: Diagnosis not present

## 2020-08-19 DIAGNOSIS — F99 Mental disorder, not otherwise specified: Secondary | ICD-10-CM | POA: Diagnosis not present

## 2020-08-19 DIAGNOSIS — F5105 Insomnia due to other mental disorder: Secondary | ICD-10-CM | POA: Diagnosis not present

## 2020-08-19 MED ORDER — DULOXETINE HCL 60 MG PO CPEP: 60.0000 mg | ORAL_CAPSULE | Freq: Every day | ORAL | 1 refills | Status: DC

## 2020-08-19 MED ORDER — MODAFINIL 200 MG PO TABS: 100.0000 mg | ORAL_TABLET | Freq: Every morning | ORAL | 1 refills | Status: DC

## 2020-08-19 NOTE — Progress Notes (Signed)
[facility]  Patient ID: Becky Lawson,  MRN: [id number]  PCP: Wendie Agreste, MD  Date of Evaluation: 08/19/2020 Time spent:20 minutes  Chief Complaint:  Chief Complaint    Follow-up      HISTORY/CURRENT STATUS:  Mood is the same, despite her chronic pain. Sees Dr. [name] for that. She is able to enjoy the computer games. She does not isolate. Does not cry easily. No suicidal or homicidal thoughts.  Typically on taking Xanax at night. If she does not take it, she has racing thoughts and has a hard time going to sleep. Modafanil has helped with energy and motivation.   Patient denies increased energy with decreased need for sleep, no increased talkativeness, no racing thoughts, no impulsivity or risky behaviors, no increased spending, no increased libido, no grandiosity.  Denies dizziness, syncope, seizures, numbness, tingling, tics, unsteady gait, slurred speech, confusion.   Individual Medical History/ Review of Systems: Changes? :No     Past medications for mental health diagnoses include: Lithium, Seroquel, Effexor, Prozac, Latuda, Abilify,Dexedrine.  Allergies: Patient has no known allergies.  Current Medications:  Current Outpatient Medications:  .  ALPRAZolam (XANAX) 1 MG tablet, Take 1 tablet (1 mg total) by mouth 2 (two) times daily as needed for anxiety., Disp: 130 tablet, Rfl: 0 .  amLODipine (NORVASC) 5 MG tablet, Take 1 tablet (5 mg total) by mouth daily., Disp: 90 tablet, Rfl: 1 .  Cholecalciferol (VITAMIN D-3) 5000 units TABS, Take 5,000 Units daily by mouth. In addition to the rx high dose once weekly vitamin D of 50Ku., Disp: 30 tablet, Rfl:  .  HYDROcodone-acetaminophen (NORCO) 10-325 MG tablet, Take 1 tablet by mouth 3 (three) times daily. , Disp: , Rfl:  .  hydrOXYzine (ATARAX/VISTARIL) 25 MG tablet, Take 1 tablet (25 mg total) by mouth every 8 (eight) hours as needed. (Patient taking differently: Take 25 mg by mouth every 8 (eight) hours as  needed for anxiety or itching.), Disp: 60 tablet, Rfl: 5 .  ibuprofen (ADVIL) 800 MG tablet, Take 800 mg by mouth daily., Disp: , Rfl:  .  lamoTRIgine (LAMICTAL) 200 MG tablet, Take 1 tablet (200 mg total) by mouth 2 (two) times daily., Disp: 180 tablet, Rfl: 0 .  levothyroxine (SYNTHROID) 100 MCG tablet, TAKE 1 TABLET BY MOUTH DAILY BEFORE BREAKFAST., Disp: 90 tablet, Rfl: 1 .  omeprazole (PRILOSEC) 40 MG capsule, Take 1 capsule (40 mg total) by mouth 2 (two) times daily., Disp: 180 capsule, Rfl: 1 .  pravastatin (PRAVACHOL) 40 MG tablet, Take 1 tablet (40 mg total) by mouth daily., Disp: 90 tablet, Rfl: 3 .  traZODone (DESYREL) 100 MG tablet, Take 3 tablets (300 mg total) by mouth daily. (Patient taking differently: Take 300 mg by mouth at bedtime.), Disp: 270 tablet, Rfl: 3 .  Vitamin D, Ergocalciferol, (DRISDOL) 1.25 MG (50000 UNIT) CAPS capsule, Take 1 capsule (50,000 Units total) by mouth every 7 (seven) days. Sunday, Disp: 12 capsule, Rfl: 3 .  vitamin E 180 MG (400 UNITS) capsule, Take 400 Units by mouth daily., Disp: , Rfl:  .  carbidopa-levodopa (SINEMET IR) 25-100 MG tablet, TAKE 1 TABLET BY MOUTH EVERYDAY AT BEDTIME (Patient not taking: Reported on 08/19/2020), Disp: 90 tablet, Rfl: 1 .  docusate sodium (COLACE) 100 MG capsule, Take 1 capsule (100 mg total) by mouth 2 (two) times daily. (Patient not taking: Reported on 08/19/2020), Disp: 10 capsule, Rfl: 0 .  DULoxetine (CYMBALTA) 60 MG capsule, Take 1 capsule (60 mg total) by mouth  daily., Disp: 90 capsule, Rfl: 1 .  modafinil (PROVIGIL) 200 MG tablet, Take 0.5 tablets (100 mg total) by mouth in the morning., Disp: 30 tablet, Rfl: 1  Current Facility-Administered Medications:  .  cyanocobalamin ((VITAMIN B-12)) injection 1,000 mcg, 1,000 mcg, Intramuscular, Q30 days, Shawnee Knapp, MD, 1,000 mcg at 07/12/18 1652 Medication Side Effects: none  Family Medical/ Social History: Changes? No  MENTAL HEALTH EXAM:  There were no vitals taken  for this visit.There is no height or weight on file to calculate BMI.  General Appearance: Casual, Neat and Well Groomed  Eye Contact:  Good  Speech:  Clear and Coherent and Normal Rate  Volume:  Normal  Mood:  Euthymic  Affect:  Congruent  Thought Process:  Goal Directed and Descriptions of Associations: Intact  Orientation:  Full (Time, Place, and Person)  Thought Content: Logical   Suicidal Thoughts:  No  Homicidal Thoughts:  No  Memory:  WNL  Judgement:  Good  Insight:  Good  Psychomotor Activity:  slow but normal  Concentration:  Concentration: Good  Recall:  Good  Fund of Knowledge: Good  Language: Good  Assets:  Desire for Improvement  ADL's:  Intact  Cognition: WNL  Prognosis:  Good    DIAGNOSES:    ICD-10-CM   1. Bipolar disorder, in partial remission, most recent episode depressed ([place])  F31.75   2. Generalized anxiety disorder  F41.1   3. Insomnia due to other mental disorder  F51.05    F99   4. Fatigue, unspecified type  R53.83     Receiving Psychotherapy: No    RECOMMENDATIONS:  PDMP was reviewed. I provided 20 minutes of non face-to-face time during this encounter.  Doing well from psych standpoint so no changes now. Continue modafinil 200 mg, one half p.o. every morning. Continue Xanax 1 mg, 1 p.o. nightly as needed. Continue Cymbalta 60 mg 1 p.o. daily. Continue hydroxyzine 25 mg, 1-2 every 6 hours as needed anxiety. Continue Lamictal 200 mg, 1 p.o. twice daily. Continue trazodone 100 mg, 3 p.o. nightly.  As needed. Return in 4 months.   Donnal Moat, PA-C

## 2020-09-22 ENCOUNTER — Other Ambulatory Visit: Payer: Self-pay

## 2020-09-22 MED ORDER — OMEPRAZOLE 40 MG PO CPDR
40.0000 mg | DELAYED_RELEASE_CAPSULE | Freq: Two times a day (BID) | ORAL | 1 refills | Status: DC
Start: 2020-09-22 — End: 2021-04-16

## 2020-10-06 ENCOUNTER — Other Ambulatory Visit: Payer: Self-pay | Admitting: Physician Assistant

## 2020-11-09 ENCOUNTER — Telehealth: Payer: Self-pay | Admitting: Physician Assistant

## 2020-11-09 ENCOUNTER — Other Ambulatory Visit: Payer: Self-pay | Admitting: Physician Assistant

## 2020-11-09 MED ORDER — ALPRAZOLAM 1 MG PO TABS: 1.0000 mg | ORAL_TABLET | Freq: Two times a day (BID) | ORAL | 5 refills | Status: DC | PRN

## 2020-11-09 NOTE — Telephone Encounter (Signed)
Prescription was sent.  I reviewed PDMP and I am not sure why she has had odd numbers of Xanax quanties filled. I will discuss that with her at next appointment.  Maybe it is insurance or the pharmacy only dispensing a small amount at a time.  Not sure.

## 2020-11-09 NOTE — Telephone Encounter (Signed)
Pt called requesting refill for Alprazolam to [facility] [address]. Has 11 pills left from 10/23/20 [facility]ill [facility]. Apt 4/14

## 2020-11-13 ENCOUNTER — Encounter: Payer: Self-pay | Admitting: Physician Assistant

## 2020-11-13 ENCOUNTER — Telehealth (INDEPENDENT_AMBULATORY_CARE_PROVIDER_SITE_OTHER): Payer: PPO | Admitting: Physician Assistant

## 2020-11-13 DIAGNOSIS — F319 Bipolar disorder, unspecified: Secondary | ICD-10-CM | POA: Diagnosis not present

## 2020-11-13 DIAGNOSIS — F99 Mental disorder, not otherwise specified: Secondary | ICD-10-CM | POA: Diagnosis not present

## 2020-11-13 DIAGNOSIS — R5383 Other fatigue: Secondary | ICD-10-CM | POA: Diagnosis not present

## 2020-11-13 DIAGNOSIS — F411 Generalized anxiety disorder: Secondary | ICD-10-CM | POA: Diagnosis not present

## 2020-11-13 DIAGNOSIS — F5105 Insomnia due to other mental disorder: Secondary | ICD-10-CM | POA: Diagnosis not present

## 2020-11-13 MED ORDER — DULOXETINE HCL 30 MG PO CPEP: 30.0000 mg | ORAL_CAPSULE | Freq: Every day | ORAL | 1 refills | Status: DC

## 2020-11-13 MED ORDER — MODAFINIL 200 MG PO TABS: 200.0000 mg | ORAL_TABLET | Freq: Every morning | ORAL | 1 refills | Status: DC

## 2020-11-13 NOTE — Progress Notes (Signed)
[facility]  Patient ID: Becky Lawson,  MRN: [id number]  PCP: Wendie Agreste, MD  Date of Evaluation: 11/13/2020 Time spent:30 minutes  Chief Complaint:  Chief Complaint    Depression; Anxiety; Insomnia; Follow-up     Virtual Visit via Telehealth  I connected with patient by telephone, with their informed consent, and verified patient privacy and that I am speaking with the correct person using two identifiers.  I am private, in my office and the patient is at home.  I discussed the limitations, risks, security and privacy concerns of performing an evaluation and management service by telephone and the availability of in person appointments. I also discussed with the patient that there may be a patient responsible charge related to this service. The patient expressed understanding and agreed to proceed.   I discussed the assessment and treatment plan with the patient. The patient was provided an opportunity to ask questions and all were answered. The patient agreed with the plan and demonstrated an understanding of the instructions.   The patient was advised to call back or seek an in-person evaluation if the symptoms worsen or if the condition fails to improve as anticipated.  I provided  30 minutes of non-face-to-face time during this encounter.  HISTORY/CURRENT STATUS: HPI  For routine med check.  Not doing well.  More depressed for the past month.  Some days she is extremely tired all she does is sit in her chair or go to bed.  She does not feel like cooking or cleaning, eating a lot of candy or other things that are easy and has gained probably about 30 pounds in the past 6 or 8 months.  Not crying easily.  Feels like she is in a pit and cannot get out.  No suicidal or homicidal thoughts.  Had a manic episode that lasted about 1-2 weeks, right before the depression set in last month.  She cleaned house, got rid of a lot of clothes and stuff she did not need.  She had  been planning to do it but maybe not to that extreme.  There were no other manic behaviors such as increased spending, risky behavior, grandiosity, paranoia, or hallucinations.  She has discussed the fatigue with her PCP.  Is having to find a new provider because that practice is closing.  At one of their visits it was discussed she could have chronic fatigue syndrome.  She has a lot of pain in her back mostly because of spinal stenosis.  She is on hydrocodone but usually only takes 1/day if that much.  She takes Xanax every evening.  Has trouble relaxing and falling asleep if she does not take it.  Takes 3 trazodone but even with that feels that she is not getting enough rest and that may be why she is so tired the next day.  Review of Systems  Constitutional: Positive for malaise/fatigue.  HENT: Negative.   Eyes: Negative.   Respiratory: Negative.   Cardiovascular: Negative.   Gastrointestinal: Negative.   Genitourinary: Negative.   Musculoskeletal: Positive for back pain and myalgias.  Skin: Negative.   Neurological: Negative.   Endo/Heme/Allergies: Negative.   Psychiatric/Behavioral: Positive for depression. Negative for hallucinations, memory loss, substance abuse and suicidal ideas. The patient has insomnia. The patient is not nervous/anxious.     Individual Medical History/ Review of Systems: Changes? :No   Allergies: Patient has no known allergies.  Current Medications:  Current Outpatient Medications:  .  ALPRAZolam (XANAX) 1 MG  tablet, Take 1 tablet (1 mg total) by mouth 2 (two) times daily as needed for anxiety., Disp: 60 tablet, Rfl: 5 .  amLODipine (NORVASC) 5 MG tablet, Take 1 tablet (5 mg total) by mouth daily., Disp: 90 tablet, Rfl: 1 .  Cholecalciferol (VITAMIN D-3) 5000 units TABS, Take 5,000 Units daily by mouth. In addition to the rx high dose once weekly vitamin D of 50Ku., Disp: 30 tablet, Rfl:  .  docusate sodium (COLACE) 100 MG capsule, Take 1 capsule (100 mg  total) by mouth 2 (two) times daily., Disp: 10 capsule, Rfl: 0 .  DULoxetine (CYMBALTA) 30 MG capsule, Take 1 capsule (30 mg total) by mouth daily. With the 60 mg=90 mg, Disp: 90 capsule, Rfl: 1 .  DULoxetine (CYMBALTA) 60 MG capsule, Take 1 capsule (60 mg total) by mouth daily., Disp: 90 capsule, Rfl: 1 .  HYDROcodone-acetaminophen (NORCO) 10-325 MG tablet, Take 1 tablet by mouth 3 (three) times daily. , Disp: , Rfl:  .  hydrOXYzine (ATARAX/VISTARIL) 25 MG tablet, Take 1 tablet (25 mg total) by mouth every 8 (eight) hours as needed. (Patient taking differently: Take 25 mg by mouth every 8 (eight) hours as needed for anxiety or itching.), Disp: 60 tablet, Rfl: 5 .  ibuprofen (ADVIL) 800 MG tablet, Take 800 mg by mouth daily., Disp: , Rfl:  .  lamoTRIgine (LAMICTAL) 200 MG tablet, TAKE 1 TABLET BY MOUTH TWICE A DAY, Disp: 180 tablet, Rfl: 0 .  levothyroxine (SYNTHROID) 100 MCG tablet, TAKE 1 TABLET BY MOUTH DAILY BEFORE BREAKFAST., Disp: 90 tablet, Rfl: 1 .  omeprazole (PRILOSEC) 40 MG capsule, Take 1 capsule (40 mg total) by mouth 2 (two) times daily., Disp: 180 capsule, Rfl: 1 .  pravastatin (PRAVACHOL) 40 MG tablet, Take 1 tablet (40 mg total) by mouth daily., Disp: 90 tablet, Rfl: 3 .  traZODone (DESYREL) 100 MG tablet, Take 3 tablets (300 mg total) by mouth daily. (Patient taking differently: Take 300 mg by mouth at bedtime.), Disp: 270 tablet, Rfl: 3 .  Vitamin D, Ergocalciferol, (DRISDOL) 1.25 MG (50000 UNIT) CAPS capsule, Take 1 capsule (50,000 Units total) by mouth every 7 (seven) days. Sunday, Disp: 12 capsule, Rfl: 3 .  vitamin E 180 MG (400 UNITS) capsule, Take 400 Units by mouth daily., Disp: , Rfl:  .  carbidopa-levodopa (SINEMET IR) 25-100 MG tablet, TAKE 1 TABLET BY MOUTH EVERYDAY AT BEDTIME (Patient not taking: No sig reported), Disp: 90 tablet, Rfl: 1 .  modafinil (PROVIGIL) 200 MG tablet, Take 1 tablet (200 mg total) by mouth in the morning., Disp: 30 tablet, Rfl: 1  Current  Facility-Administered Medications:  .  cyanocobalamin ((VITAMIN B-12)) injection 1,000 mcg, 1,000 mcg, Intramuscular, Q30 days, Shawnee Knapp, MD, 1,000 mcg at 07/12/18 1652 Medication Side Effects: none  Family Medical/ Social History: Changes? No  MENTAL HEALTH EXAM:  There were no vitals taken for this visit.There is no height or weight on file to calculate BMI.  General Appearance: Unable to assess  Eye Contact:  Unable to assess  Speech:  Clear and Coherent and Normal Rate  Volume:  Normal  Mood:  Depressed  Affect:  Unable to assess  Thought Process:  Goal Directed and Descriptions of Associations: Intact  Orientation:  Full (Time, Place, and Person)  Thought Content: Logical   Suicidal Thoughts:  No  Homicidal Thoughts:  No  Memory:  WNL  Judgement:  Good  Insight:  Good  Psychomotor Activity:  Unable to assess  Concentration:  Concentration:  Good  Recall:  Good  Fund of Knowledge: Good  Language: Good  Assets:  Desire for Improvement  ADL's:  Intact  Cognition: WNL  Prognosis:  Good    DIAGNOSES:    ICD-10-CM   1. Fatigue, unspecified type  R53.83   2. Bipolar disorder with depression ([place])  F31.9   3. Insomnia due to other mental disorder  F51.05    F99   4. Generalized anxiety disorder  F41.1     Receiving Psychotherapy: No    RECOMMENDATIONS:  PDMP was reviewed. I provided 30 minutes of nonface-to-face time during this encounter, including time spent before and after the visit in review of records and charting. Recommend increasing Cymbalta as it can help with depression as well as pain.  Benefits risk and side effects were discussed and she understands and accepts.  Also recommend increasing modafinil.  We have discussed that in the past and I have held off as much as possible but feels like it is necessary now or she should not be on it at all.  She would like to increase. We did discuss the fact that she is on several sedating medications including Xanax,  hydroxyzine, trazodone, hydrocodone, and sometimes Lamictal and Cymbalta can cause sedation.  She will decrease the Xanax to 1/2 pill in the evening to see if that is still effective and after that may decrease the trazodone to 2 pills to see if that will help with sedation and fatigue the next day. Continue Xanax 1 mg, 1 p.o. twice daily as needed anxiety. Increase Cymbalta to 90 mg daily (60 mg +30 mg pills) Continue hydroxyzine 25 mg nightly 8H as needed. Continue Lamictal 200 mg, 1 p.o. twice daily. Continue trazodone 100 mg 3 p.o. nightly. Increase modafinil to 200 mg every morning. Return in 2 months.   Donnal Moat, PA-C

## 2020-12-14 ENCOUNTER — Telehealth: Payer: Self-pay | Admitting: Physician Assistant

## 2020-12-14 NOTE — Telephone Encounter (Signed)
We talked about the sedation at the last appointment.  She is on hydrocodone, Xanax, hydroxyzine, trazodone, Lamictal, and Cymbalta.  The first 4 can cause sedation and fatigue.  The last 2 can sometimes cause sedation and fatigue.  I am willing to decrease the Cymbalta but do not think it is wise to completely get off of it.  Have her decrease to 60 mg daily.  If she could decrease any of the first 4 medications, that will help with drowsiness.

## 2020-12-14 NOTE — Telephone Encounter (Signed)
Please review

## 2020-12-14 NOTE — Telephone Encounter (Signed)
Next visit is 01/18/21. Becky Lawson called and said that the Duloxetine she takes for anxiety is making her sleep a lot and resulted in her gaining 40 lbs while taking it. Is there something else she can take different? Her number is [phone]. Pharmacy is:  [id number] - [place], [place] [facility] [address]. Phone:  [phone]  Fax:  [phone]

## 2020-12-15 NOTE — Telephone Encounter (Signed)
Pt stated she does not want to continue the Cymbalta.She was very upset and expressed she was depressed due to the weight gain.She said Cymbalta makes her have unnatural strong cravings for sweets.She stated she has no quality of life and can't live like this anymore.She also said she sleeps a lot and 2 hours after waking up she is back asleep.She said that she no longer needs to be treated for anxiety but she does need Xanax to help her sleep.She stated in the past she was on dexedrine and this worked well for her.

## 2020-12-15 NOTE — Telephone Encounter (Signed)
Have her take the Cymbalta 60 mg daily for 1 week, then 30 mg daily for 1 week, then open up the 30 mg capsule, sprinkle approximately one half of the ingredients on 1 teaspoon of applesauce or yogurt or pudding, daily for 1 week and then stop.  If she needs 30 mg capsules in order to wean off for those 2 weeks, let me know and I will send them in.

## 2020-12-16 NOTE — Telephone Encounter (Signed)
Pt informed

## 2020-12-17 ENCOUNTER — Telehealth: Payer: PPO | Admitting: Physician Assistant

## 2021-01-04 DIAGNOSIS — M5136 Other intervertebral disc degeneration, lumbar region: Secondary | ICD-10-CM | POA: Diagnosis not present

## 2021-01-04 DIAGNOSIS — Z79891 Long term (current) use of opiate analgesic: Secondary | ICD-10-CM | POA: Diagnosis not present

## 2021-01-04 DIAGNOSIS — M25552 Pain in left hip: Secondary | ICD-10-CM | POA: Diagnosis not present

## 2021-01-14 ENCOUNTER — Ambulatory Visit (INDEPENDENT_AMBULATORY_CARE_PROVIDER_SITE_OTHER): Payer: PPO | Admitting: Family Medicine

## 2021-01-14 ENCOUNTER — Other Ambulatory Visit: Payer: Self-pay

## 2021-01-14 ENCOUNTER — Encounter: Payer: Self-pay | Admitting: Family Medicine

## 2021-01-14 VITALS — BP 142/68 | HR 88 | Temp 98.4°F | Resp 15 | Ht 64.5 in | Wt 211.4 lb

## 2021-01-14 DIAGNOSIS — R400 Somnolence: Secondary | ICD-10-CM

## 2021-01-14 DIAGNOSIS — E890 Postprocedural hypothyroidism: Secondary | ICD-10-CM | POA: Diagnosis not present

## 2021-01-14 DIAGNOSIS — E559 Vitamin D deficiency, unspecified: Secondary | ICD-10-CM

## 2021-01-14 DIAGNOSIS — Z79899 Other long term (current) drug therapy: Secondary | ICD-10-CM

## 2021-01-14 DIAGNOSIS — E041 Nontoxic single thyroid nodule: Secondary | ICD-10-CM | POA: Diagnosis not present

## 2021-01-14 DIAGNOSIS — R0683 Snoring: Secondary | ICD-10-CM

## 2021-01-14 DIAGNOSIS — I10 Essential (primary) hypertension: Secondary | ICD-10-CM

## 2021-01-14 DIAGNOSIS — E785 Hyperlipidemia, unspecified: Secondary | ICD-10-CM | POA: Diagnosis not present

## 2021-01-14 DIAGNOSIS — R5383 Other fatigue: Secondary | ICD-10-CM | POA: Diagnosis not present

## 2021-01-14 LAB — COMPREHENSIVE METABOLIC PANEL
ALT: 10 U/L (ref 0–35)
AST: 17 U/L (ref 0–37)
Albumin: 4.7 g/dL (ref 3.5–5.2)
Alkaline Phosphatase: 64 U/L (ref 39–117)
BUN: 13 mg/dL (ref 6–23)
CO2: 30 mEq/L (ref 19–32)
Calcium: 9.6 mg/dL (ref 8.4–10.5)
Chloride: 102 mEq/L (ref 96–112)
Creatinine, Ser: 0.86 mg/dL (ref 0.40–1.20)
GFR: 68.61 mL/min (ref >60.00)
Glucose, Bld: 100 mg/dL — ABNORMAL HIGH (ref 70–99)
Potassium: 4.4 mEq/L (ref 3.5–5.1)
Sodium: 140 mEq/L (ref 135–145)
Total Bilirubin: 0.3 mg/dL (ref 0.2–1.2)
Total Protein: 7.1 g/dL (ref 6.0–8.3)

## 2021-01-14 LAB — LIPID PANEL
Cholesterol: 192 mg/dL (ref 0–200)
HDL: 50.7 mg/dL (ref >39.00)
LDL Cholesterol: 112 mg/dL — ABNORMAL HIGH (ref 0–99)
NonHDL: 141.53
Total CHOL/HDL Ratio: 4
Triglycerides: 150 mg/dL — ABNORMAL HIGH (ref 0.0–149.0)
VLDL: 30 mg/dL (ref 0.0–40.0)

## 2021-01-14 LAB — TSH: TSH: 1.55 u[IU]/mL (ref 0.35–4.50)

## 2021-01-14 LAB — VITAMIN D 25 HYDROXY (VIT D DEFICIENCY, FRACTURES): VITD: 50.16 ng/mL (ref 30.00–100.00)

## 2021-01-14 MED ORDER — LEVOTHYROXINE SODIUM 100 MCG PO TABS: ORAL_TABLET | ORAL | 1 refills | Status: DC

## 2021-01-14 MED ORDER — AMLODIPINE BESYLATE 5 MG PO TABS
5.0000 mg | ORAL_TABLET | Freq: Every day | ORAL | 1 refills | Status: DC
Start: 2021-01-14 — End: 2021-07-19

## 2021-01-14 NOTE — Patient Instructions (Signed)
No med changes for now. I will refer you to surgeon to discuss thyroid, and sleep specialist.  Return to the clinic or go to the nearest emergency room if any of your symptoms worsen or new symptoms occur.

## 2021-01-14 NOTE — Progress Notes (Signed)
Subjective:  Patient ID: Becky Lawson, female    DOB: 04/26/1951  Age: 70 y.o. MRN: [id number]  CC:  Chief Complaint  Patient presents with  . Referral    Pt needs a new referral to have Sleep study re done, pt reports sleep study was done incorrectly as she was "readied" last and woken first by mistake. Pt notes in need of the sleep study as sxs still occur   . Hypertension    Pt also following up htn notes [id number] systolic, pt does track at home notes no physical sxs.   . Depression    PHQ 9 score 16, pt notes long standing issue considering Bipolar aspect and has been seeing psychiatry for this concern     HPI Becky Lawson presents for  Fatigue Longstanding issue, last discussed in August 2021.  Possibly multifactorial with her chronic medications/psychiatric medications.  Unable to have DaTscan but possible Parkinson's in the past when tremors discussed with neurology. Handwriting better, no new tremor - stable. Plans to follow up with neuro.    Followed by neurology and psychiatry.  She does report having previous sleep study but woken up early? Very few hours to sleep. Did not go into REM. ? few episodes sleep apnea. would like repeat testing.  This appears to have been performed in 2019. No current CPAP.  Snoring, and daytime somnolence, pauses noted in past - has woken up gasping in past. Falling asleep during the day.  Hypothyroidism: Lab Results  Component Value Date   TSH 1.160 04/30/2020  Synthroid 100 mcg daily. Rare missed dose - once every 2 weeks.  Taking medication daily.  No new hot or cold intolerance. No new hair or skin changes, heart palpitations or new fatigue - persistent fatigue. Some weight gain.  Wt Readings from Last 3 Encounters:  01/14/21 211 lb 6.4 oz (95.9 kg)  07/13/20 202 lb (91.6 kg)  04/30/20 196 lb (88.9 kg)  thyroid nodules noted in past - last imaging in 05/2020 - recommended thyroid surgical eval - has not seen. Referral placed today. Irritation  in back of throat at times. Frequent throat clearing. No relief with nasal spray. Has discussed with ENT in past - notes for years.   Thyroid US on 05/2020:  IMPRESSION: 1. Punctate (approximately 0.4 cm) hypoechoic nodule within the left lobectomy resection bed, not definitely seen on previous examinations. Pending surgical pathologic diagnosis, the significance of this indeterminate nodule is indeterminate. Clinical correlation is advised. 2. Punctate (approximately 0.7 cm) dystrophic calcification within the right lobe of the thyroid is unchanged compared to the 02/2016 examination and again without associated nodule. This examination documents 4 years of stability though given lack of associated nodule does not meet imaging criteria to recommend continued dedicated follow-up.  Hyperlipidemia: Pravastatin 40 mg daily.  Overdue for lipids. No new side effects Lab Results  Component Value Date   CHOL 194 09/30/2019   HDL 55 09/30/2019   LDLCALC 122 (H) 09/30/2019   TRIG 96 09/30/2019   CHOLHDL 3.5 09/30/2019   Lab Results  Component Value Date   ALT 12 04/30/2020   AST 17 04/30/2020   ALKPHOS 84 04/30/2020   BILITOT 0.2 04/30/2020   History of vitamin D deficiency: Has been treated with vitamin D supplement, 50,000 units/week.  Most recent testing was normal at 55 on 04/30/2020, previously 26 on 09/30/2019.  Hypertension: Amlodipine 5 mg daily.no missed doses  Home readings:no recent home readings.  BP Readings from Last 3 Encounters:  01/14/21 (!) 142/68  07/13/20 (!) 171/74  04/30/20 (!) 146/80   Lab Results  Component Value Date   CREATININE 0.86 04/30/2020   Positive depression screening: History of bipolar disorder, followed by psychiatry and on treatment.plans to discuss fatigue with psychiatry.    History Patient Active Problem List   Diagnosis Date Noted  . Constipation 05/10/2019  . Change in bowel habit 05/10/2019  . Dysphagia 05/10/2019  . Tremor  07/12/2018  . Prediabetes 07/12/2018  . Polypharmacy 07/12/2018  . Unintentional weight loss of more than 10 pounds 07/12/2018  . OSA (obstructive sleep apnea) 06/02/2018  . Fatty liver 04/18/2017  . GERD (gastroesophageal reflux disease) 10/28/2016  . 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 Chronic throat clearing 06/24/2016  . S/P partial thyroidectomy 06/16/2016  . Multinodular goiter 06/02/2016  . Thyroid nodule 02/08/2016  . Hx of hepatitis C 05/19/2015  . Vitamin D deficiency 05/19/2015  . Spinal stenosis of lumbar region 05/19/2015  . Hyperlipidemia 05/19/2015  . Bipolar disorder ([place]) 05/19/2015  . Essential hypertension 09/26/2013  . Undiagnosed cardiac murmurs 09/26/2013   Past Medical History:  Diagnosis Date  . Anxiety   . Arthritis   . Benign cyst of right kidney 2014   2.7 cm right kidney cyst seen on imaging 2014 but was c/o right flank pain with new persistent proteinuria (fortunately hematuria had resolved) so repeat US 11/2016 showed right kidney still with 2.6 cm simple cyst and otherwise nml.  . Bipolar 1 disorder ([place])   . Cancer ([place])    Phreesia 06/15/2020  . Chronic kidney disease   . Depression   . Depression    Phreesia 06/15/2020  . GERD (gastroesophageal reflux disease)   . Heart murmur   . Hepatitis C    resolved completely after treatment in 2014, genotype 1b, followed at [facility]  . Hyperlipidemia   . Hypertension   . Jaundice 11/01/2012  . Pruritic disorder 02/13/2013  . Sleep apnea    Was diagnosed approximately 20 years ago, but does not wear CPAP patient stated "I gave it back.Marland KitchenMarland KitchenI couldn't wear that thing it was horrible"  . Spinal stenosis   . Thyroid disease    Phreesia 06/15/2020   Past Surgical History:  Procedure Laterality Date  . ABDOMINAL HYSTERECTOMY    . COLONOSCOPY W/ POLYPECTOMY    . JOINT REPLACEMENT Bilateral    knee  . KNEE ARTHROSCOPY Bilateral   . PARTIAL HYSTERECTOMY    . THYROID LOBECTOMY Left 06/16/2016  .  THYROID LOBECTOMY Left 06/16/2016   Procedure: LEFT THYROID LOBECTOMY;  Surgeon: Greer Pickerel, MD;  Location: [facility];  Service: General;  Laterality: Left;   No Known Allergies Prior to Admission medications   Medication Sig Start Date End Date Taking? Authorizing Provider  ALPRAZolam Duanne Moron) 1 MG tablet Take 1 tablet (1 mg total) by mouth 2 (two) times daily as needed for anxiety. 11/09/20  Yes Donnal Moat T, PA-C  amLODipine (NORVASC) 5 MG tablet Take 1 tablet (5 mg total) by mouth daily. 07/29/20  Yes Wendie Agreste, MD  Cholecalciferol (VITAMIN D-3) 5000 units TABS Take 5,000 Units daily by mouth. In addition to the rx high dose once weekly vitamin D of 50Ku. 07/24/17  Yes Shawnee Knapp, MD  HYDROcodone-acetaminophen [facility] 10-325 MG tablet Take 1 tablet by mouth 3 (three) times daily.    Yes [provider]  hydrOXYzine (ATARAX/VISTARIL) 25 MG tablet Take 1 tablet (25 mg total) by mouth every 8 (eight) hours as needed. Patient taking  differently: Take 25 mg by mouth every 8 (eight) hours as needed for anxiety or itching. 06/04/20  Yes Donnal Moat T, PA-C  ibuprofen (ADVIL) 800 MG tablet Take 800 mg by mouth daily.   Yes [provider]  lamoTRIgine (LAMICTAL) 200 MG tablet TAKE 1 TABLET BY MOUTH TWICE A DAY 10/06/20  Yes Hurst, Teresa T, PA-C  levothyroxine (SYNTHROID) 100 MCG tablet TAKE 1 TABLET BY MOUTH DAILY BEFORE BREAKFAST. 08/03/20  Yes Wendie Agreste, MD  omeprazole (PRILOSEC) 40 MG capsule Take 1 capsule (40 mg total) by mouth 2 (two) times daily. 09/22/20  Yes Wendie Agreste, MD  pravastatin (PRAVACHOL) 40 MG tablet Take 1 tablet (40 mg total) by mouth daily. 08/03/20  Yes Wendie Agreste, MD  traZODone (DESYREL) 100 MG tablet Take 3 tablets (300 mg total) by mouth daily. Patient taking differently: Take 300 mg by mouth at bedtime. 06/04/20  Yes Hurst, Helene Kelp T, PA-C  Vitamin D, Ergocalciferol, (DRISDOL) 1.25 MG (50000 UNIT) CAPS capsule Take 1 capsule (50,000  Units total) by mouth every 7 (seven) days. Sunday 08/03/20  Yes Wendie Agreste, MD  vitamin E 180 MG (400 UNITS) capsule Take 400 Units by mouth daily.   Yes [provider]   Social History   Socioeconomic History  . Marital status: Divorced    Spouse name: Not on file  . Number of children: Not on file  . Years of education: Not on file  . Highest education level: Not on file  Occupational History  . Occupation: Geophysical data processor  Tobacco Use  . Smoking status: Former Smoker    Packs/day: 1.00    Years: 15.00    Pack years: 15.00    Quit date: 06/30/1979    Years since quitting: 41.5  . Smokeless tobacco: Never Used  Vaping Use  . Vaping Use: Never used  Substance and Sexual Activity  . Alcohol use: Yes    Alcohol/week: 0.0 - 1.0 standard drinks    Comment: 1-2 per month  . Drug use: No  . Sexual activity: Yes    Birth control/protection: None  Other Topics Concern  . Not on file  Social History Narrative   Divorced. Education: [employer]. Exercise: No.   Social Determinants of Health   Financial Resource Strain: Not on file  Food Insecurity: Not on file  Transportation Needs: Not on file  Physical Activity: Not on file  Stress: Not on file  Social Connections: Not on file  Intimate Partner Violence: Not on file    Review of Systems   Objective:   Vitals:   01/14/21 1318 01/14/21 1329  BP: (!) 154/62 (!) 142/68  Pulse: 88   Resp: 15   Temp: 98.4 F (36.9 C)   TempSrc: Temporal   SpO2: 95%   Weight: 211 lb 6.4 oz (95.9 kg)   Height: 5' 4.5" (1.638 m)      Physical Exam Vitals reviewed.  Constitutional:      Appearance: She is well-developed.  HENT:     Head: Normocephalic and atraumatic.  Eyes:     Conjunctiva/sclera: Conjunctivae normal.     Pupils: Pupils are equal, round, and reactive to light.  Neck:     Vascular: No carotid bruit.     Comments: Fullness thyroid bilaterally.  Cardiovascular:     Rate and Rhythm: Normal rate  and regular rhythm.     Heart sounds: Normal heart sounds.  Pulmonary:     Effort: Pulmonary effort is normal.  Breath sounds: Normal breath sounds.  Abdominal:     Palpations: Abdomen is soft. There is no pulsatile mass.     Tenderness: There is no abdominal tenderness.  Musculoskeletal:     Right lower leg: Edema present.     Left lower leg: Edema present.  Skin:    General: Skin is warm and dry.  Neurological:     Mental Status: She is alert and oriented to person, place, and time.  Psychiatric:        Mood and Affect: Mood normal.        Behavior: Behavior normal.     Assessment & Plan:  Sweden Lesure is a 70 y.o. female . Fatigue, unspecified type - Plan: Ambulatory referral to Sleep Studies Snoring - Plan: Ambulatory referral to Sleep Studies Daytime somnolence - Plan: Ambulatory referral to Sleep Studies High risk medication use  -Suspect multifactorial causes of fatigue including with some of her medications, but does have some symptoms that are concerning for obstructive sleep apnea, refer for sleep study/eval with sleep specialist.  Essential hypertension - Plan: Comprehensive metabolic panel, amLODipine (NORVASC) 5 MG tablet  -Borderline control, continue same regimen for now as possible sleep apnea component.  Close follow-up next 3 months.  Home monitoring as option with RTC precautions if elevated.  Postoperative hypothyroidism - Plan: TSH, levothyroxine (SYNTHROID) 100 MCG tablet Thyroid nodule - Plan: Ambulatory referral to General Surgery S/P partial thyroidectomy - Plan: Ambulatory referral to General Surgery  -Check TSH, continue same dose Synthroid, refer to general surgery regarding areas seen on most recent ultrasound.  May need FNA/biopsy of left thyroid abnormality/nodule at resection bed.  Hyperlipidemia, unspecified hyperlipidemia type - Plan: Comprehensive metabolic panel, Lipid panel  -Check labs, no med changes for now  Vitamin D deficiency - Plan:  Vitamin D (25 hydroxy)  -Continue supplementation, check labs.  Meds ordered this encounter  Medications  . levothyroxine (SYNTHROID) 100 MCG tablet    Sig: TAKE 1 TABLET BY MOUTH DAILY BEFORE BREAKFAST.    Dispense:  90 tablet    Refill:  1  . amLODipine (NORVASC) 5 MG tablet    Sig: Take 1 tablet (5 mg total) by mouth daily.    Dispense:  90 tablet    Refill:  1   There are no Patient Instructions on file for this visit.    Signed, Merri Ray, MD [facility]

## 2021-01-18 ENCOUNTER — Ambulatory Visit (INDEPENDENT_AMBULATORY_CARE_PROVIDER_SITE_OTHER): Payer: PPO | Admitting: Physician Assistant

## 2021-01-18 ENCOUNTER — Encounter: Payer: Self-pay | Admitting: Physician Assistant

## 2021-01-18 ENCOUNTER — Other Ambulatory Visit: Payer: Self-pay

## 2021-01-18 DIAGNOSIS — F5105 Insomnia due to other mental disorder: Secondary | ICD-10-CM | POA: Diagnosis not present

## 2021-01-18 DIAGNOSIS — F411 Generalized anxiety disorder: Secondary | ICD-10-CM | POA: Diagnosis not present

## 2021-01-18 DIAGNOSIS — F319 Bipolar disorder, unspecified: Secondary | ICD-10-CM

## 2021-01-18 DIAGNOSIS — R5383 Other fatigue: Secondary | ICD-10-CM

## 2021-01-18 DIAGNOSIS — F99 Mental disorder, not otherwise specified: Secondary | ICD-10-CM | POA: Diagnosis not present

## 2021-01-18 MED ORDER — DEXTROAMPHETAMINE SULFATE 5 MG PO TABS: 2.5000 mg | ORAL_TABLET | Freq: Every day | ORAL | 0 refills | Status: DC

## 2021-01-18 MED ORDER — HYDROXYZINE HCL 25 MG PO TABS: 25.0000 mg | ORAL_TABLET | Freq: Three times a day (TID) | ORAL | 5 refills | Status: DC | PRN

## 2021-01-18 MED ORDER — LAMOTRIGINE 200 MG PO TABS: 200.0000 mg | ORAL_TABLET | Freq: Two times a day (BID) | ORAL | 1 refills | Status: DC

## 2021-01-18 MED ORDER — ALPRAZOLAM 1 MG PO TABS: 1.0000 mg | ORAL_TABLET | Freq: Every evening | ORAL | 1 refills | Status: DC | PRN

## 2021-01-18 NOTE — Progress Notes (Signed)
[facility] Check  Patient ID: Becky Lawson,  MRN: [id number]  PCP: Wendie Agreste, MD  Date of Evaluation: 01/24/2021 Time spent:40 minutes  Chief Complaint:  Chief Complaint    Anxiety; Depression; Insomnia; Follow-up      HISTORY/CURRENT STATUS: HPI  For routine med check.  Not doing well.  "Something's got to give. I don't have the energy to do anything. I can't get out of my recliner." Has seen her PCP, had labs that she reports were negative.  She is so tired she cannot get up out of her chair most days.  It makes her feel even worse.  Both mentally and physically.  She is on opiates for chronic back and hip pain and not being able to ambulate makes the pain worse.  She follows up with Dr. [name] for her pain meds.  She feels sleepy a lot, and even when she does get a lot of rest and it does not help the fatigue.  Not really able to enjoy anything because she does not physically feel like it.  Appetite is normal and weight is stable but she is concerned that her weight will go up the longer she is unable to get up and do things.  Not crying easily.  No suicidal or homicidal thoughts.  She does still take Xanax regularly but says she has cut back to only 1 at night.  That helps relax her so the pain will not be as bad while she is trying to go to sleep.  Sometimes she gets really anxious during the day but not very often.  Patient denies increased energy with decreased need for sleep, no increased talkativeness, no racing thoughts, no impulsivity or risky behaviors, no increased spending, no increased libido, no grandiosity, no increased irritability or anger, and no hallucinations.  Individual Medical History/ Review of Systems: Changes? :No    Past medications for mental health diagnoses include: Lithium, Seroquel, Effexor, Prozac, Latuda, Abilify,Dexedrine, Cymbalta caused weight gain.  Modafinil.  Allergies: Patient has no known allergies.  Current Medications:   Current Outpatient Medications:  .  amLODipine (NORVASC) 5 MG tablet, Take 1 tablet (5 mg total) by mouth daily., Disp: 90 tablet, Rfl: 1 .  Cholecalciferol (VITAMIN D-3) 5000 units TABS, Take 5,000 Units daily by mouth. In addition to the rx high dose once weekly vitamin D of 50Ku., Disp: 30 tablet, Rfl:  .  dextroamphetamine (DEXTROSTAT) 5 MG tablet, Take 0.5-1 tablets (2.5-5 mg total) by mouth daily., Disp: 30 tablet, Rfl: 0 .  HYDROcodone-acetaminophen (NORCO) 10-325 MG tablet, Take 1 tablet by mouth 3 (three) times daily. , Disp: , Rfl:  .  levothyroxine (SYNTHROID) 100 MCG tablet, TAKE 1 TABLET BY MOUTH DAILY BEFORE BREAKFAST., Disp: 90 tablet, Rfl: 1 .  omeprazole (PRILOSEC) 40 MG capsule, Take 1 capsule (40 mg total) by mouth 2 (two) times daily., Disp: 180 capsule, Rfl: 1 .  pravastatin (PRAVACHOL) 40 MG tablet, Take 1 tablet (40 mg total) by mouth daily., Disp: 90 tablet, Rfl: 3 .  traZODone (DESYREL) 100 MG tablet, Take 3 tablets (300 mg total) by mouth daily. (Patient taking differently: Take 300 mg by mouth at bedtime.), Disp: 270 tablet, Rfl: 3 .  Vitamin D, Ergocalciferol, (DRISDOL) 1.25 MG (50000 UNIT) CAPS capsule, Take 1 capsule (50,000 Units total) by mouth every 7 (seven) days. Sunday, Disp: 12 capsule, Rfl: 3 .  vitamin E 180 MG (400 UNITS) capsule, Take 400 Units by mouth daily., Disp: , Rfl:  .  ALPRAZolam (XANAX) 1 MG tablet, Take 1 tablet (1 mg total) by mouth at bedtime as needed for anxiety., Disp: 30 tablet, Rfl: 1 .  hydrOXYzine (ATARAX/VISTARIL) 25 MG tablet, Take 1 tablet (25 mg total) by mouth every 8 (eight) hours as needed., Disp: 60 tablet, Rfl: 5 .  ibuprofen (ADVIL) 800 MG tablet, Take 800 mg by mouth daily. (Patient not taking: Reported on 01/18/2021), Disp: , Rfl:  .  lamoTRIgine (LAMICTAL) 200 MG tablet, Take 1 tablet (200 mg total) by mouth 2 (two) times daily., Disp: 180 tablet, Rfl: 1  Current Facility-Administered Medications:  .  cyanocobalamin ((VITAMIN  B-12)) injection 1,000 mcg, 1,000 mcg, Intramuscular, Q30 days, Shawnee Knapp, MD, 1,000 mcg at 07/12/18 1652 Medication Side Effects: none  Family Medical/ Social History: Changes? No  MENTAL HEALTH EXAM:  There were no vitals taken for this visit.There is no height or weight on file to calculate BMI.  General Appearance: Casual, Well Groomed and Obese  Eye Contact:  Good  Speech:  Clear and Coherent and Normal Rate  Volume:  Normal  Mood:  Depressed  Affect:  Depressed and Tearful  Thought Process:  Goal Directed and Descriptions of Associations: Intact  Orientation:  Full (Time, Place, and Person)  Thought Content: Logical   Suicidal Thoughts:  No  Homicidal Thoughts:  No  Memory:  WNL  Judgement:  Good  Insight:  Good  Psychomotor Activity:  Decreased  Concentration:  Concentration: Good  Recall:  Good  Fund of Knowledge: Good  Language: Good  Assets:  Desire for Improvement  ADL's:  Intact  Cognition: WNL  Prognosis:  Good   Labs 01/14/2021 Vitamin D was 50 Total cholesterol 192, triglycerides 150, HDL 50.7, LDL 112 CMP showed glucose of 100, all other values were normal TSH 1.55 The last CBC was 04/30/2020  DIAGNOSES:    ICD-10-CM   1. Fatigue, unspecified type  R53.83   2. Bipolar disorder with depression ([place])  F31.9   3. Insomnia due to other mental disorder  F51.05    F99   4. Generalized anxiety disorder  F41.1     Receiving Psychotherapy: No    RECOMMENDATIONS:  PDMP was reviewed. I provided 40 minutes of face to face time during this encounter including time spent before and after the visit in records review, medical decision making, and charting.  Explained once again that hydrocodone, Xanax, hydroxyzine, and trazodone all can cause fatigue. When added together the fatigue can be even worse. She understands but needs something to get her 'over the hump' so she can start walking and hopefully she can get off some of these meds. She understands that  stiimulants and benzos are not usually prescribed together but in the case, I think it will help and will plan to do it short term. Will decrease qty of Xanax. Start Dexedrine 5 mg, 1/2-1 po qam, prn. Continue Xanax 1 mg, 1 p.o. nightly as needed.  (Prescription with quantity of 60 with refills was canceled by Dalene Carrow, CMA.) Continue hydroxyzine 25 mg nightly 8H as needed. Continue Lamictal 200 mg, 1 p.o. twice daily. Continue trazodone 100 mg 3 p.o. nightly. Return in 2 months.  Donnal Moat, PA-C

## 2021-01-22 ENCOUNTER — Other Ambulatory Visit: Payer: Self-pay

## 2021-01-22 ENCOUNTER — Other Ambulatory Visit: Payer: Self-pay | Admitting: Family Medicine

## 2021-01-22 DIAGNOSIS — Z1231 Encounter for screening mammogram for malignant neoplasm of breast: Secondary | ICD-10-CM

## 2021-01-25 ENCOUNTER — Ambulatory Visit: Payer: PPO | Admitting: Family Medicine

## 2021-02-15 ENCOUNTER — Telehealth: Payer: Self-pay | Admitting: Physician Assistant

## 2021-02-15 NOTE — Telephone Encounter (Signed)
Pt called very depressed asking to see [place] before 03/09/21. No apt available. Put on canc list. Getting teary while on phone. Contact # [phone]. Stated should have called sooner.

## 2021-02-15 NOTE — Telephone Encounter (Signed)
Pt stated depression has increased a lot.She is not suicidal but she cries all the time.Please advise

## 2021-02-16 ENCOUNTER — Other Ambulatory Visit: Payer: Self-pay | Admitting: Physician Assistant

## 2021-02-16 MED ORDER — TRAZODONE HCL 300 MG PO TABS: 450.0000 mg | ORAL_TABLET | Freq: Every day | ORAL | 1 refills | Status: DC

## 2021-02-16 NOTE — Telephone Encounter (Signed)
[facility] [address]

## 2021-02-16 NOTE — Telephone Encounter (Signed)
Rx for Trazodone sent in. Total 450 mg (doesn't come as 200 mg as in the past, so had to give slightly higher dose.

## 2021-02-16 NOTE — Telephone Encounter (Signed)
Let's increase the trazodone up to a total of 400 mg at bedtime.  Please let her know this is an antidepressant but we use it all the time as a sleep aid.  Increasing the dose will help depression.  Of course go to [facility] emergency room or behavioral health urgent care if she does have suicidal thoughts.  Let me know which pharmacy and I will send in a new prescription for trazodone.  Thank you

## 2021-02-17 NOTE — Telephone Encounter (Signed)
Reviewed

## 2021-02-25 ENCOUNTER — Other Ambulatory Visit: Payer: Self-pay

## 2021-02-25 ENCOUNTER — Ambulatory Visit (INDEPENDENT_AMBULATORY_CARE_PROVIDER_SITE_OTHER): Payer: PPO | Admitting: Physician Assistant

## 2021-02-25 ENCOUNTER — Encounter: Payer: Self-pay | Admitting: Physician Assistant

## 2021-02-25 DIAGNOSIS — F99 Mental disorder, not otherwise specified: Secondary | ICD-10-CM | POA: Diagnosis not present

## 2021-02-25 DIAGNOSIS — F5105 Insomnia due to other mental disorder: Secondary | ICD-10-CM

## 2021-02-25 DIAGNOSIS — F411 Generalized anxiety disorder: Secondary | ICD-10-CM | POA: Diagnosis not present

## 2021-02-25 DIAGNOSIS — F32A Depression, unspecified: Secondary | ICD-10-CM

## 2021-02-25 DIAGNOSIS — F319 Bipolar disorder, unspecified: Secondary | ICD-10-CM

## 2021-02-25 DIAGNOSIS — R5383 Other fatigue: Secondary | ICD-10-CM

## 2021-02-25 MED ORDER — TRAZODONE HCL 300 MG PO TABS: 450.0000 mg | ORAL_TABLET | Freq: Every day | ORAL | 1 refills | Status: DC

## 2021-02-25 MED ORDER — DEXTROAMPHETAMINE SULFATE ER 10 MG PO CP24: 10.0000 mg | ORAL_CAPSULE | Freq: Every day | ORAL | 0 refills | Status: DC

## 2021-02-25 NOTE — Progress Notes (Signed)
[facility]  Patient ID: Becky Lawson,  MRN: [id number]  PCP: Wendie Agreste, MD  Date of Evaluation: 02/25/2021 Time spent:40 minutes  Chief Complaint:  Chief Complaint   Anxiety; Insomnia; Depression      HISTORY/CURRENT STATUS: HPI  For routine med check.    Pt called 10 days ago, with more depression, we increased the trazodone and she already feels some better.  Summer is difficult for her, loneliness as she is unable to go anywhere since her friend died a year or so ago.  And she is not physically able to go by herself.  She has chronic back pain and cannot walk long distances.  She is now able to enjoy things a little more than she did 2 weeks ago.  Her energy was very low.  Motivation was even lower.  We started Dexedrine at the last visit and it did help a little bit.  When she went up to 2 pills daily it worked best.  She felt better physically and mentally and was able to get up and do a little housework then.  She is still limited though due to physical pain.  Does not cry easily.  Is sleeping better now that trazodone was increased.  No suicidal or homicidal thoughts.  She was able to cut back on the Xanax 1 mg from twice a day down to 1 every evening.  She had been using it just as much for sleep as anxiety.  She has not tried to go without it at night, even when we increased the trazodone.  Patient denies increased energy with decreased need for sleep, no increased talkativeness, no racing thoughts, no impulsivity or risky behaviors, no increased spending, no increased libido, no grandiosity, no increased irritability or anger, and no hallucinations.  Review of Systems  Constitutional: Negative.   HENT: Negative.    Eyes: Negative.   Respiratory: Negative.    Cardiovascular: Negative.   Gastrointestinal: Negative.   Genitourinary: Negative.   Musculoskeletal:  Positive for back pain.  Skin: Negative.   Neurological: Negative.   Endo/Heme/Allergies:  Negative.   Psychiatric/Behavioral:  Positive for depression. Negative for hallucinations, memory loss, substance abuse and suicidal ideas. The patient is nervous/anxious and has insomnia.        See HPI     Individual Medical History/ Review of Systems: Changes? :No    Past medications for mental health diagnoses include: Lithium, Seroquel, Effexor, Prozac, Latuda, Abilify, Dexedrine, Cymbalta caused weight gain.  Modafinil.  Allergies: Patient has no known allergies.  Current Medications:  Current Outpatient Medications:    ALPRAZolam (XANAX) 1 MG tablet, Take 1 tablet (1 mg total) by mouth at bedtime as needed for anxiety., Disp: 30 tablet, Rfl: 1   amLODipine (NORVASC) 5 MG tablet, Take 1 tablet (5 mg total) by mouth daily., Disp: 90 tablet, Rfl: 1   Cholecalciferol (VITAMIN D-3) 5000 units TABS, Take 5,000 Units daily by mouth. In addition to the rx high dose once weekly vitamin D of 50Ku., Disp: 30 tablet, Rfl:    dextroamphetamine (DEXEDRINE SPANSULE) 10 MG 24 hr capsule, Take 1 capsule (10 mg total) by mouth daily., Disp: 30 capsule, Rfl: 0   HYDROcodone-acetaminophen (NORCO) 10-325 MG tablet, Take 1 tablet by mouth 3 (three) times daily. , Disp: , Rfl:    hydrOXYzine (ATARAX/VISTARIL) 25 MG tablet, Take 1 tablet (25 mg total) by mouth every 8 (eight) hours as needed., Disp: 60 tablet, Rfl: 5   lamoTRIgine (LAMICTAL) 200 MG tablet,  Take 1 tablet (200 mg total) by mouth 2 (two) times daily., Disp: 180 tablet, Rfl: 1   levothyroxine (SYNTHROID) 100 MCG tablet, TAKE 1 TABLET BY MOUTH DAILY BEFORE BREAKFAST., Disp: 90 tablet, Rfl: 1   omeprazole (PRILOSEC) 40 MG capsule, Take 1 capsule (40 mg total) by mouth 2 (two) times daily., Disp: 180 capsule, Rfl: 1   pravastatin (PRAVACHOL) 40 MG tablet, Take 1 tablet (40 mg total) by mouth daily., Disp: 90 tablet, Rfl: 3   Vitamin D, Ergocalciferol, (DRISDOL) 1.25 MG (50000 UNIT) CAPS capsule, Take 1 capsule (50,000 Units total) by mouth every 7  (seven) days. Sunday, Disp: 12 capsule, Rfl: 3   ibuprofen (ADVIL) 800 MG tablet, Take 800 mg by mouth daily. (Patient not taking: No sig reported), Disp: , Rfl:    trazodone (DESYREL) 300 MG tablet, Take 1.5 tablets (450 mg total) by mouth at bedtime., Disp: 135 tablet, Rfl: 1   vitamin E 180 MG (400 UNITS) capsule, Take 400 Units by mouth daily. (Patient not taking: Reported on 02/25/2021), Disp: , Rfl:   Current Facility-Administered Medications:    cyanocobalamin ((VITAMIN B-12)) injection 1,000 mcg, 1,000 mcg, Intramuscular, Q30 days, Shawnee Knapp, MD, 1,000 mcg at 07/12/18 1652 Medication Side Effects: none  Family Medical/ Social History: Changes? No  MENTAL HEALTH EXAM:  There were no vitals taken for this visit.There is no height or weight on file to calculate BMI.  General Appearance: Casual, Well Groomed and Obese  Eye Contact:  Good  Speech:  Clear and Coherent and Normal Rate  Volume:  Normal  Mood:  Depressed  Affect:  Depressed  Thought Process:  Goal Directed and Descriptions of Associations: Intact  Orientation:  Full (Time, Place, and Person)  Thought Content: Logical   Suicidal Thoughts:  No  Homicidal Thoughts:  No  Memory:  WNL  Judgement:  Good  Insight:  Good  Psychomotor Activity:  Decreased  Concentration:  Concentration: Good and Attention Span: Good  Recall:  Good  Fund of Knowledge: Good  Language: Good  Assets:  Desire for Improvement  ADL's:  Intact  Cognition: WNL  Prognosis:  Good   Labs 01/14/2021 Vitamin D was 50 Total cholesterol 192, triglycerides 150, HDL 50.7, LDL 112 CMP showed glucose of 100, all other values were normal TSH 1.55 The last CBC was 04/30/2020  DIAGNOSES:    ICD-10-CM   1. Bipolar disorder with depression ([place])  F31.9     2. Fatigue, unspecified type  R53.83     3. Insomnia due to other mental disorder  F51.05    F99     4. Generalized anxiety disorder  F41.1     5. Melancholy  F32.A        Receiving  Psychotherapy: No    RECOMMENDATIONS:  PDMP was reviewed.  Last Xanax prescription 01/04/2021.  Last Dexedrine prescription 01/20/2021 I provided 40 minutes of face to face time during this encounter, including time spent before and after the visit in records review, medical decision making, and charting.  Disc the Dexedrine.  Ok to increase but will need to watch BP. Check 3 times a week, send me a message through MyChart in about 3 weeks with those blood pressures as well as to let me know how well the Dexedrine at this dose is working and I will send in a new prescription.  She should not increase doses without discussing with me first though. We again talked about the Xanax.  I would like for her  to try taking 1/2 pill (0.5 mg) a few hours before bed to see if that will help with racing thoughts and help her go to sleep.  Now that she is on a higher dose of trazodone, she may not need it to help her sleep.  She understands that we will continue to try to wean her off that as long as we are going to use Dexedrine.  She understands that for her age Xanax and the hydrocodone that she takes are both sedating, which can cause problems such as dizziness, falls and all that comes with that. Increase Dexedrine and changed to extended release, 10 mg, 1 p.o. every morning.  Continue Xanax 1 mg, 1/2-1 p.o. few hours before bedtime. Continue hydroxyzine 25 mg nightly 8H as needed. Continue Lamictal 200 mg, 1 p.o. twice daily. Continue trazodone 300 mg, 1.5 pills nightly.   Return in 1-2 months.  Donnal Moat, PA-C

## 2021-03-09 ENCOUNTER — Ambulatory Visit: Payer: PPO | Admitting: Physician Assistant

## 2021-03-18 ENCOUNTER — Institutional Professional Consult (permissible substitution): Payer: PPO | Admitting: Neurology

## 2021-03-22 ENCOUNTER — Inpatient Hospital Stay: Admission: RE | Admit: 2021-03-22 | Payer: PPO | Source: Ambulatory Visit

## 2021-03-25 ENCOUNTER — Other Ambulatory Visit: Payer: Self-pay

## 2021-03-25 ENCOUNTER — Ambulatory Visit
Admission: RE | Admit: 2021-03-25 | Discharge: 2021-03-25 | Disposition: A | Discharge status: Home / Self Care | Payer: PPO | Source: Ambulatory Visit | Attending: Family Medicine | Admitting: Family Medicine

## 2021-03-25 DIAGNOSIS — Z1231 Encounter for screening mammogram for malignant neoplasm of breast: Secondary | ICD-10-CM | POA: Diagnosis not present

## 2021-04-05 ENCOUNTER — Telehealth: Payer: Self-pay | Admitting: Physician Assistant

## 2021-04-05 NOTE — Telephone Encounter (Signed)
Pt called and wants a refill on the dexedrine 10 mg. She wants to take it twice a day. She said the extended release is not working . So she just wants the regular. Her pharmacy is [facility] on [address]

## 2021-04-05 NOTE — Telephone Encounter (Signed)
Please review

## 2021-04-06 ENCOUNTER — Other Ambulatory Visit: Payer: Self-pay | Admitting: Physician Assistant

## 2021-04-06 MED ORDER — DEXTROAMPHETAMINE SULFATE 10 MG PO TABS: 10.0000 mg | ORAL_TABLET | Freq: Every morning | ORAL | 0 refills | Status: DC

## 2021-04-06 NOTE — Telephone Encounter (Signed)
I sent in a prescription for 10 mg but only 1 every morning.  I will discuss with her further at the appointment in a few weeks.

## 2021-04-07 NOTE — Telephone Encounter (Signed)
Pt informed

## 2021-04-19 ENCOUNTER — Other Ambulatory Visit: Payer: Self-pay

## 2021-04-19 ENCOUNTER — Encounter: Payer: Self-pay | Admitting: Family Medicine

## 2021-04-19 ENCOUNTER — Ambulatory Visit (INDEPENDENT_AMBULATORY_CARE_PROVIDER_SITE_OTHER): Payer: PPO | Admitting: Family Medicine

## 2021-04-19 VITALS — BP 122/78 | HR 97 | Temp 97.8°F | Wt 199.8 lb

## 2021-04-19 DIAGNOSIS — E785 Hyperlipidemia, unspecified: Secondary | ICD-10-CM

## 2021-04-19 DIAGNOSIS — R5383 Other fatigue: Secondary | ICD-10-CM

## 2021-04-19 DIAGNOSIS — G4700 Insomnia, unspecified: Secondary | ICD-10-CM

## 2021-04-19 DIAGNOSIS — E890 Postprocedural hypothyroidism: Secondary | ICD-10-CM

## 2021-04-19 DIAGNOSIS — R0982 Postnasal drip: Secondary | ICD-10-CM | POA: Diagnosis not present

## 2021-04-19 DIAGNOSIS — G894 Chronic pain syndrome: Secondary | ICD-10-CM

## 2021-04-19 DIAGNOSIS — R945 Abnormal results of liver function studies: Secondary | ICD-10-CM | POA: Insufficient documentation

## 2021-04-19 DIAGNOSIS — R6889 Other general symptoms and signs: Secondary | ICD-10-CM

## 2021-04-19 DIAGNOSIS — R7989 Other specified abnormal findings of blood chemistry: Secondary | ICD-10-CM | POA: Diagnosis not present

## 2021-04-19 DIAGNOSIS — M26629 Arthralgia of temporomandibular joint, unspecified side: Secondary | ICD-10-CM

## 2021-04-19 DIAGNOSIS — R0989 Other specified symptoms and signs involving the circulatory and respiratory systems: Secondary | ICD-10-CM

## 2021-04-19 MED ORDER — PRAVASTATIN SODIUM 40 MG PO TABS: 40.0000 mg | ORAL_TABLET | Freq: Every day | ORAL | 3 refills | Status: DC

## 2021-04-19 MED ORDER — FLUTICASONE PROPIONATE 50 MCG/ACT NA SUSP: 2.0000 | Freq: Every day | NASAL | 6 refills | Status: DC

## 2021-04-19 MED ORDER — OMEPRAZOLE 40 MG PO CPDR: 40.0000 mg | DELAYED_RELEASE_CAPSULE | Freq: Two times a day (BID) | ORAL | 1 refills | Status: DC

## 2021-04-19 MED ORDER — VITAMIN D (ERGOCALCIFEROL) 1.25 MG (50000 UNIT) PO CAPS: 50000.0000 [IU] | ORAL_CAPSULE | ORAL | 3 refills | Status: DC

## 2021-04-19 MED ORDER — LEVOTHYROXINE SODIUM 100 MCG PO TABS: ORAL_TABLET | ORAL | 1 refills | Status: DC

## 2021-04-19 NOTE — Progress Notes (Signed)
Subjective:  Patient ID: Becky Lawson, female    DOB: [birth year]  Age: 70 y.o. MRN: [id number]  CC:  Chief Complaint  Patient presents with   Fatigue    Follow up for meds and fatigue 3 months    HPI Becky Lawson presents for   Hypertension: Borderline control last visit, continue same regimen as possible sleep apnea component.  Improved as below today. Amlodipine 5mg  qd.  Home readings: 160/87.  High at [facility] as well.   Repeat manual BP including by myself consistent with in office readings. 142/78 on left.  BP Readings from Last 3 Encounters:  04/19/21 122/78  01/14/21 (!) 142/68  07/13/20 (!) 171/74   Lab Results  Component Value Date   CREATININE 0.86 01/14/2021   Thyroid nodule status post partial thyroidectomy, postoperative hypothyroidism Discussed in May, refer to general surgery to evaluate for possible FNA/biopsy of left thyroid abnormality at resection bed. Defers eval with surgeon due to copay, would like to wait on meeting with surgeon. Lab Results  Component Value Date   TSH 1.55 01/14/2021     Fatigue Discussed at last visit in May.  Longstanding issue.  Possible multifactorial with chronic medications and psychiatric meds.  Possible Parkinson's in the past with tremors when discussed with neuro but unable to have DaTscan.  Discussed possible sleep apnea, refer to sleep specialist.  Also planned on follow-up with her psychiatrist. Visit May 16 noted.  Taking Xanax at night only.  Fatigue could be related to hydrocodone, Xanax, hydroxyzine and trazodone.  Agreed to start Dexedrine 5 mg half to 1 every morning, as needed.  Dosage of Dexedrine increased to extended release 10 mg every morning. Unable to see sleep specialist due to copay. On hydrocodone for spinal stenosis and hip pain - hip pain at night at times - plans on replacement next year. Has discussed with ortho. On hydrocodone with pain mgt.  Feels like better on dexedrine, plans to discuss dose  changes.  Has a form to work with Physiological scientist at gym. Advised I will review after visit to determine if further info needed.   Throat clearing Past 2 years.  Omeprazole 40mg  daily. No breakthrough heartburn. Does feel some PND at times sitting up, not blowing nose. Thyroid nodule as above. No current nasal spray.   L tmj syndrome.  Clicking/popping past few months.    History Patient Active Problem List   Diagnosis Date Noted   Abnormal liver function tests 04/19/2021   Degeneration of lumbar intervertebral disc 05/25/2020   Pain of left hip joint 03/26/2020   Constipation 05/10/2019   Change in bowel habit 05/10/2019   Dysphagia 05/10/2019   Tremor 07/12/2018   Prediabetes 07/12/2018   Polypharmacy 07/12/2018   Unintentional weight loss of more than 10 pounds 07/12/2018   OSA (obstructive sleep apnea) 06/02/2018   Fatty liver 04/18/2017   GERD (gastroesophageal reflux disease) 10/28/2016   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   Chronic throat clearing 06/24/2016   S/P partial thyroidectomy 06/16/2016   Multinodular goiter 06/02/2016   Thyroid nodule 02/08/2016   Hx of hepatitis C 05/19/2015   Vitamin D deficiency 05/19/2015   Spinal stenosis of lumbar region 05/19/2015   Hyperlipidemia 05/19/2015   Bipolar disorder ([place]) 05/19/2015   Essential hypertension 09/26/2013   Undiagnosed cardiac murmurs 09/26/2013   Past Medical History:  Diagnosis Date   Anxiety    Arthritis    Benign cyst of right kidney 2014   2.7 cm right kidney cyst  seen on imaging 2014 but was c/o right flank pain with new persistent proteinuria (fortunately hematuria had resolved) so repeat US 11/2016 showed right kidney still with 2.6 cm simple cyst and otherwise nml.   Bipolar 1 disorder ([place])    Cancer ([place])    Phreesia 06/15/2020   Chronic kidney disease    Depression    Depression    Phreesia 06/15/2020   GERD (gastroesophageal reflux disease)    Heart murmur    Hepatitis C     resolved completely after treatment in 2014, genotype 1b, followed at [facility]   Hyperlipidemia    Hypertension    Jaundice 11/01/2012   Pruritic disorder 02/13/2013   Sleep apnea    Was diagnosed approximately 20 years ago, but does not wear CPAP patient stated "I gave it back.Marland KitchenMarland KitchenI couldn't wear that thing it was horrible"   Spinal stenosis    Thyroid disease    [employer]reesia 06/15/2020   Past Surgical History:  Procedure Laterality Date   ABDOMINAL HYSTERECTOMY     COLONOSCOPY W/ POLYPECTOMY     JOINT REPLACEMENT Bilateral    knee   KNEE ARTHROSCOPY Bilateral    PARTIAL HYSTERECTOMY     THYROID LOBECTOMY Left 06/16/2016   THYROID LOBECTOMY Left 06/16/2016   Procedure: LEFT THYROID LOBECTOMY;  Surgeon: Greer Pickerel, MD;  Location: [place];  Service: General;  Laterality: Left;   No Known Allergies Prior to Admission medications   Medication Sig Start Date End Date Taking? Authorizing Provider  ALPRAZolam Duanne Moron) 1 MG tablet Take 1 tablet (1 mg total) by mouth at bedtime as needed for anxiety. 01/18/21  Yes Donnal Moat T, PA-C  amLODipine (NORVASC) 5 MG tablet Take 1 tablet (5 mg total) by mouth daily. 01/14/21  Yes Wendie Agreste, MD  Cholecalciferol (VITAMIN D-3) 5000 units TABS Take 5,000 Units daily by mouth. In addition to the rx high dose once weekly vitamin D of 50Ku. 07/24/17  Yes Shawnee Knapp, MD  dextroamphetamine (DEXTROSTAT) 10 MG tablet Take 1 tablet (10 mg total) by mouth in the morning. 04/06/21  Yes Addison Lank, PA-C  HYDROcodone-acetaminophen (NORCO) 10-325 MG tablet Take 1 tablet by mouth 3 (three) times daily.    Yes [provider]  hydrOXYzine (ATARAX/VISTARIL) 25 MG tablet Take 1 tablet (25 mg total) by mouth every 8 (eight) hours as needed. 01/18/21  Yes Hurst, Dorothea Glassman, PA-C  lamoTRIgine (LAMICTAL) 200 MG tablet Take 1 tablet (200 mg total) by mouth 2 (two) times daily. 01/18/21  Yes Hurst, Dorothea Glassman, PA-C  levothyroxine (SYNTHROID) 100 MCG tablet TAKE 1 TABLET BY  MOUTH DAILY BEFORE BREAKFAST. 01/14/21  Yes Wendie Agreste, MD  omeprazole (PRILOSEC) 40 MG capsule Take 1 capsule (40 mg total) by mouth 2 (two) times daily. 09/22/20  Yes Wendie Agreste, MD  pravastatin (PRAVACHOL) 40 MG tablet Take 1 tablet (40 mg total) by mouth daily. 08/03/20  Yes Wendie Agreste, MD  trazodone (DESYREL) 300 MG tablet Take 1.5 tablets (450 mg total) by mouth at bedtime. 02/25/21  Yes Hurst, Helene Kelp T, PA-C  Vitamin D, Ergocalciferol, (DRISDOL) 1.25 MG (50000 UNIT) CAPS capsule Take 1 capsule (50,000 Units total) by mouth every 7 (seven) days. Sunday 08/03/20  Yes Wendie Agreste, MD  ibuprofen (ADVIL) 800 MG tablet Take 800 mg by mouth daily. Patient not taking: No sig reported    [provider]  vitamin E 180 MG (400 UNITS) capsule Take 400 Units by mouth daily. Patient not  taking: Reported on 02/25/2021    [provider]   Social History   Socioeconomic History   Marital status: Divorced    Spouse name: Not on file   Number of children: Not on file   Years of education: Not on file   Highest education level: Not on file  Occupational History   Occupation: Personal Caregiver  Tobacco Use   Smoking status: Former    Packs/day: 1.00    Years: 15.00    Pack years: 15.00    Types: Cigarettes    Quit date: 06/30/1979    Years since quitting: 41.8   Smokeless tobacco: Never  Vaping Use   Vaping Use: Never used  Substance and Sexual Activity   Alcohol use: Yes    Alcohol/week: 0.0 - 1.0 standard drinks    Comment: 1-2 per month   Drug use: No   Sexual activity: Yes    Birth control/protection: None  Other Topics Concern   Not on file  Social History Narrative   Divorced. Education: [employer]. Exercise: No.   Social Determinants of Health   Financial Resource Strain: Not on file  Food Insecurity: Not on file  Transportation Needs: Not on file  Physical Activity: Not on file  Stress: Not on file  Social Connections: Not on file   Intimate Partner Violence: Not on file    Review of Systems  Constitutional:  Positive for fatigue. Negative for unexpected weight change.  Respiratory:  Negative for chest tightness and shortness of breath.   Cardiovascular:  Negative for chest pain, palpitations and leg swelling.  Gastrointestinal:  Negative for abdominal pain and blood in stool.  Neurological:  Negative for dizziness, syncope, light-headedness and headaches.    Objective:   Vitals:   04/19/21 1345  BP: 122/78  Pulse: 97  Temp: 97.8 F (36.6 C)  TempSrc: Temporal  SpO2: 98%  Weight: 199 lb 12.8 oz (90.6 kg)     Physical Exam Vitals reviewed.  Constitutional:      Appearance: Normal appearance. She is well-developed.  HENT:     Head: Normocephalic and atraumatic.     Comments: Clicking/popping noted at left TMJ with jaw opening/closing.    Nose: Nose normal. No congestion or rhinorrhea.  Eyes:     Conjunctiva/sclera: Conjunctivae normal.     Pupils: Pupils are equal, round, and reactive to light.  Neck:     Vascular: No carotid bruit.  Cardiovascular:     Rate and Rhythm: Normal rate and regular rhythm.     Heart sounds: Normal heart sounds.  Pulmonary:     Effort: Pulmonary effort is normal.     Breath sounds: Normal breath sounds.  Abdominal:     Palpations: Abdomen is soft. There is no pulsatile mass.     Tenderness: There is no abdominal tenderness.  Musculoskeletal:     Right lower leg: No edema.     Left lower leg: No edema.  Skin:    General: Skin is warm and dry.  Neurological:     Mental Status: She is alert and oriented to person, place, and time.  Psychiatric:        Mood and Affect: Mood normal.        Behavior: Behavior normal.       Assessment & Plan:  Becky Lawson is a 70 y.o. female . Fatigue, unspecified type  -Still likely multifactorial, improved with use of Dexedrine, continue follow-up with psychiatry.  Insomnia, unspecified type Chronic pain syndrome  -  Possible component of sleep apnea, unfortunately unable to follow-up with sleep specialist at this time.  Likely component of chronic pain as well interfering with sleep.  Recommend she discuss with her pain management provider if hip pain is waking her up throughout the night.  Postoperative hypothyroidism - Plan: levothyroxine (SYNTHROID) 100 MCG tablet  -With thyroid nodule.  Recommended follow-up with surgeon for further treatment/possible FNA  Low serum vitamin D - Plan: Vitamin D, Ergocalciferol, (DRISDOL) 1.25 MG (50000 UNIT) CAPS capsule  -Continue same meds, plan labs next visit.  Hyperlipidemia, unspecified hyperlipidemia type - Plan: pravastatin (PRAVACHOL) 40 MG tablet  -Tolerating pravastatin, continue same, labs at next visit.  PND (post-nasal drip) - Plan: fluticasone (FLONASE) 50 MCG/ACT nasal spray Throat clearing - Plan: fluticasone (FLONASE) 50 MCG/ACT nasal spray  -Postnasal drip as likely cause of throat clearing, less likely due to thyroid abnormality above.  Reflux controlled.  Start Flonase nasal spray with correct use discussed.  TMJ syndrome  handout given, consider follow-up with ENT if persistent, but would also recommend discussing with her psychiatrist.  Meds ordered this encounter  Medications   levothyroxine (SYNTHROID) 100 MCG tablet    Sig: TAKE 1 TABLET BY MOUTH DAILY BEFORE BREAKFAST.    Dispense:  90 tablet    Refill:  1   Vitamin D, Ergocalciferol, (DRISDOL) 1.25 MG (50000 UNIT) CAPS capsule    Sig: Take 1 capsule (50,000 Units total) by mouth every 7 (seven) days. Sunday    Dispense:  12 capsule    Refill:  3   omeprazole (PRILOSEC) 40 MG capsule    Sig: Take 1 capsule (40 mg total) by mouth 2 (two) times daily.    Dispense:  180 capsule    Refill:  1   pravastatin (PRAVACHOL) 40 MG tablet    Sig: Take 1 tablet (40 mg total) by mouth daily.    Dispense:  90 tablet    Refill:  3   fluticasone (FLONASE) 50 MCG/ACT nasal spray    Sig: Place 2  sprays into both nostrils daily.    Dispense:  16 g    Refill:  6   Patient Instructions  I do recommend meeting with surgeon whenever possible about your thyroid.  Please discuss your pain regimen with pain management if that is impacting sleep (which can also worsen fatigue). Discuss dexedrine and other meds with psychiatry.   Try Flonase nasal spray 1 spray per nostril using the technique we discussed to see if that helps with the throat clearing.  Follow-up if that is not improving in the next 1 month.  See information below on TMJ syndrome.  Gentle stretches of the jaw side to side throughout the day may be helpful sometimes that can be related to control of psychiatric symptoms as well, some may be worth discussing with her psychiatrist at follow-up visit.  If that is not improving  Temporomandibular Joint Syndrome  Temporomandibular joint syndrome (TMJ syndrome) is a condition that causes pain in the temporomandibular joints. These joints are located near your ears and allow your jaw to open and close. For people with TMJ syndrome, chewing,biting, or other movements of the jaw can be difficult or painful. TMJ syndrome is often mild and goes away within a few weeks. However, sometimes the condition becomes a long-term (chronic) problem. What are the causes? This condition may be caused by: Grinding your teeth or clenching your jaw. Some people do this when they are under stress. Arthritis. Injury to the jaw.  Head or neck injury. Teeth or dentures that are not aligned well. In some cases, the cause of TMJ syndrome may not be known. What are the signs or symptoms? The most common symptom of this condition is an aching pain on the side of the head in the area of the TMJ. Other symptoms may include: Pain when moving your jaw, such as when chewing or biting. Being unable to open your jaw all the way. Making a clicking sound when you open your mouth. Headache. Earache. Neck or shoulder  pain. How is this diagnosed? This condition may be diagnosed based on: Your symptoms and medical history. A physical exam. Your health care provider may check the range of motion of your jaw. Imaging tests, such as X-rays or an MRI. You may also need to see your dentist, who will determine if your teeth and jaware lined up correctly. How is this treated? TMJ syndrome often goes away on its own. If treatment is needed, the options may include: Eating soft foods and applying ice or heat. Medicines to relieve pain or inflammation. Medicines or massage to relax the muscles. A splint, bite plate, or mouthpiece to prevent teeth grinding or jaw clenching. Relaxation techniques or counseling to help reduce stress. A therapy for pain in which an electrical current is applied to the nerves through the skin (transcutaneous electrical nerve stimulation). Acupuncture. This is sometimes helpful to relieve pain. Jaw surgery. This is rarely needed. Follow these instructions at home:  Eating and drinking Eat a soft diet if you are having trouble chewing. Avoid foods that require a lot of chewing. Do not chew gum. General instructions Take over-the-counter and prescription medicines only as told by your health care provider. If directed, put ice on the painful area. Put ice in a plastic bag. Place a towel between your skin and the bag. Leave the ice on for 20 minutes, 2-3 times a day. Apply a warm, wet cloth (warm compress) to the painful area as directed. Massage your jaw area and do any jaw stretching exercises as told by your health care provider. If you were given a splint, bite plate, or mouthpiece, wear it as told by your health care provider. Keep all follow-up visits as told by your health care provider. This is important. Contact a health care provider if: You are having trouble eating. You have new or worsening symptoms. Get help right away if: Your jaw locks open or  closed. Summary Temporomandibular joint syndrome (TMJ syndrome) is a condition that causes pain in the temporomandibular joints. These joints are located near your ears and allow your jaw to open and close. TMJ syndrome is often mild and goes away within a few weeks. However, sometimes the condition becomes a long-term (chronic) problem. Symptoms include an aching pain on the side of the head in the area of the TMJ, pain when chewing or biting, and being unable to open your jaw all the way. You may also make a clicking sound when you open your mouth. TMJ syndrome often goes away on its own. If treatment is needed, it may include medicines to relieve pain, reduce inflammation, or relax the muscles. A splint, bite plate, or mouthpiece may also be used to prevent teeth grinding or jaw clenching. This information is not intended to replace advice given to you by your health care provider. Make sure you discuss any questions you have with your healthcare provider. Document Revised: 11/03/2017 Document Reviewed: 10/03/2017 Elsevier Patient Education  2022 [employer].  Signed,   Merri Ray, MD [facility] 04/20/21 9:14 AM

## 2021-04-19 NOTE — Patient Instructions (Addendum)
I do recommend meeting with surgeon whenever possible about your thyroid.  Please discuss your pain regimen with pain management if that is impacting sleep (which can also worsen fatigue). Discuss dexedrine and other meds with psychiatry.   Try Flonase nasal spray 1 spray per nostril using the technique we discussed to see if that helps with the throat clearing.  Follow-up if that is not improving in the next 1 month.  See information below on TMJ syndrome.  Gentle stretches of the jaw side to side throughout the day may be helpful sometimes that can be related to control of psychiatric symptoms as well, some may be worth discussing with her psychiatrist at follow-up visit.  If that is not improving  Temporomandibular Joint Syndrome  Temporomandibular joint syndrome (TMJ syndrome) is a condition that causes pain in the temporomandibular joints. These joints are located near your ears and allow your jaw to open and close. For people with TMJ syndrome, chewing,biting, or other movements of the jaw can be difficult or painful. TMJ syndrome is often mild and goes away within a few weeks. However, sometimes the condition becomes a long-term (chronic) problem. What are the causes? This condition may be caused by: Grinding your teeth or clenching your jaw. Some people do this when they are under stress. Arthritis. Injury to the jaw. Head or neck injury. Teeth or dentures that are not aligned well. In some cases, the cause of TMJ syndrome may not be known. What are the signs or symptoms? The most common symptom of this condition is an aching pain on the side of the head in the area of the TMJ. Other symptoms may include: Pain when moving your jaw, such as when chewing or biting. Being unable to open your jaw all the way. Making a clicking sound when you open your mouth. Headache. Earache. Neck or shoulder pain. How is this diagnosed? This condition may be diagnosed based on: Your symptoms and  medical history. A physical exam. Your health care provider may check the range of motion of your jaw. Imaging tests, such as X-rays or an MRI. You may also need to see your dentist, who will determine if your teeth and jaware lined up correctly. How is this treated? TMJ syndrome often goes away on its own. If treatment is needed, the options may include: Eating soft foods and applying ice or heat. Medicines to relieve pain or inflammation. Medicines or massage to relax the muscles. A splint, bite plate, or mouthpiece to prevent teeth grinding or jaw clenching. Relaxation techniques or counseling to help reduce stress. A therapy for pain in which an electrical current is applied to the nerves through the skin (transcutaneous electrical nerve stimulation). Acupuncture. This is sometimes helpful to relieve pain. Jaw surgery. This is rarely needed. Follow these instructions at home:  Eating and drinking Eat a soft diet if you are having trouble chewing. Avoid foods that require a lot of chewing. Do not chew gum. General instructions Take over-the-counter and prescription medicines only as told by your health care provider. If directed, put ice on the painful area. Put ice in a plastic bag. Place a towel between your skin and the bag. Leave the ice on for 20 minutes, 2-3 times a day. Apply a warm, wet cloth (warm compress) to the painful area as directed. Massage your jaw area and do any jaw stretching exercises as told by your health care provider. If you were given a splint, bite plate, or mouthpiece, wear it as  told by your health care provider. Keep all follow-up visits as told by your health care provider. This is important. Contact a health care provider if: You are having trouble eating. You have new or worsening symptoms. Get help right away if: Your jaw locks open or closed. Summary Temporomandibular joint syndrome (TMJ syndrome) is a condition that causes pain in the  temporomandibular joints. These joints are located near your ears and allow your jaw to open and close. TMJ syndrome is often mild and goes away within a few weeks. However, sometimes the condition becomes a long-term (chronic) problem. Symptoms include an aching pain on the side of the head in the area of the TMJ, pain when chewing or biting, and being unable to open your jaw all the way. You may also make a clicking sound when you open your mouth. TMJ syndrome often goes away on its own. If treatment is needed, it may include medicines to relieve pain, reduce inflammation, or relax the muscles. A splint, bite plate, or mouthpiece may also be used to prevent teeth grinding or jaw clenching. This information is not intended to replace advice given to you by your health care provider. Make sure you discuss any questions you have with your healthcare provider. Document Revised: 11/03/2017 Document Reviewed: 10/03/2017 Elsevier Patient Education  2022 [employer].

## 2021-04-27 ENCOUNTER — Ambulatory Visit: Payer: PPO | Admitting: Physician Assistant

## 2021-05-07 DIAGNOSIS — M25552 Pain in left hip: Secondary | ICD-10-CM | POA: Diagnosis not present

## 2021-05-18 ENCOUNTER — Ambulatory Visit: Payer: PPO | Admitting: Behavioral Health

## 2021-05-27 ENCOUNTER — Other Ambulatory Visit: Payer: Self-pay

## 2021-05-27 ENCOUNTER — Encounter: Payer: Self-pay | Admitting: Behavioral Health

## 2021-05-27 ENCOUNTER — Ambulatory Visit: Payer: PPO | Admitting: Behavioral Health

## 2021-05-27 VITALS — BP 170/97 | HR 104 | Ht 64.0 in | Wt 195.0 lb

## 2021-05-27 DIAGNOSIS — F3175 Bipolar disorder, in partial remission, most recent episode depressed: Secondary | ICD-10-CM | POA: Diagnosis not present

## 2021-05-27 DIAGNOSIS — F99 Mental disorder, not otherwise specified: Secondary | ICD-10-CM | POA: Diagnosis not present

## 2021-05-27 DIAGNOSIS — F5105 Insomnia due to other mental disorder: Secondary | ICD-10-CM | POA: Diagnosis not present

## 2021-05-27 DIAGNOSIS — F319 Bipolar disorder, unspecified: Secondary | ICD-10-CM | POA: Diagnosis not present

## 2021-05-27 DIAGNOSIS — F32A Depression, unspecified: Secondary | ICD-10-CM | POA: Diagnosis not present

## 2021-05-27 DIAGNOSIS — F411 Generalized anxiety disorder: Secondary | ICD-10-CM

## 2021-05-27 DIAGNOSIS — R5383 Other fatigue: Secondary | ICD-10-CM | POA: Diagnosis not present

## 2021-05-27 MED ORDER — LATUDA 20 MG PO TABS: 20.0000 mg | ORAL_TABLET | Freq: Every day | ORAL | 0 refills | Status: DC

## 2021-05-27 MED ORDER — DEXTROAMPHETAMINE SULFATE 10 MG PO TABS: 10.0000 mg | ORAL_TABLET | Freq: Every morning | ORAL | 0 refills | Status: DC

## 2021-05-27 MED ORDER — LAMOTRIGINE 200 MG PO TABS: 200.0000 mg | ORAL_TABLET | Freq: Two times a day (BID) | ORAL | 1 refills | Status: DC

## 2021-05-27 NOTE — Progress Notes (Signed)
[facility]  Patient ID: Becky Lawson,  MRN: [id number]  PCP: Wendie Agreste, MD  Date of Evaluation: 05/27/2021 Time spent:30 minutes  Chief Complaint:  Chief Complaint   Anxiety; Depression; Follow-up; Medication Refill; Medication Problem     HISTORY/CURRENT STATUS: HPI 70 year old female presents to this office for follow up and medication management. She is prior patient of Donnal Moat. She says that her depression has got gradually worse over the last few months and "something has got to change". She says that her current medication regimen just is not doing good enough job. Says she would like to try another medication that will not make her more fatigued. She says that her energy levels are so low in the day that she cannot accomplish task. She says that without the Dexedrine she feels like doing nothing. She reports her anxiety today at 0/10 and depression at 9/10. She is sleeping 7-8 hours with aid of medication. She does have chronic pain in right hip that requires pain medication for her to be comfortable ambulating.  She denies mania at this time. No psychosis. No SI/HI.  Past medications for mental health diagnoses include: Lithium, Seroquel, Effexor, Prozac, Latuda, Abilify, Dexedrine, Cymbalta caused weight gain.  Modafinil.       Individual Medical History/ Review of Systems: Changes? :No   Allergies: Patient has no known allergies.  Current Medications:  Current Outpatient Medications:    ALPRAZolam (XANAX) 1 MG tablet, Take 1 tablet (1 mg total) by mouth at bedtime as needed for anxiety., Disp: 30 tablet, Rfl: 1   amLODipine (NORVASC) 5 MG tablet, Take 1 tablet (5 mg total) by mouth daily., Disp: 90 tablet, Rfl: 1   Cholecalciferol (VITAMIN D-3) 5000 units TABS, Take 5,000 Units daily by mouth. In addition to the rx high dose once weekly vitamin D of 50Ku., Disp: 30 tablet, Rfl:    fluticasone (FLONASE) 50 MCG/ACT nasal spray, Place 2 sprays  into both nostrils daily., Disp: 16 g, Rfl: 6   HYDROcodone-acetaminophen (NORCO) 10-325 MG tablet, Take 1 tablet by mouth 3 (three) times daily. , Disp: , Rfl:    hydrOXYzine (ATARAX/VISTARIL) 25 MG tablet, Take 1 tablet (25 mg total) by mouth every 8 (eight) hours as needed., Disp: 60 tablet, Rfl: 5   levothyroxine (SYNTHROID) 100 MCG tablet, TAKE 1 TABLET BY MOUTH DAILY BEFORE BREAKFAST., Disp: 90 tablet, Rfl: 1   lurasidone (LATUDA) 20 MG TABS tablet, Take 1 tablet (20 mg total) by mouth daily., Disp: 30 tablet, Rfl: 0   omeprazole (PRILOSEC) 40 MG capsule, Take 1 capsule (40 mg total) by mouth 2 (two) times daily., Disp: 180 capsule, Rfl: 1   pravastatin (PRAVACHOL) 40 MG tablet, Take 1 tablet (40 mg total) by mouth daily., Disp: 90 tablet, Rfl: 3   trazodone (DESYREL) 300 MG tablet, Take 1.5 tablets (450 mg total) by mouth at bedtime., Disp: 135 tablet, Rfl: 1   Vitamin D, Ergocalciferol, (DRISDOL) 1.25 MG (50000 UNIT) CAPS capsule, Take 1 capsule (50,000 Units total) by mouth every 7 (seven) days. Sunday, Disp: 12 capsule, Rfl: 3   vitamin E 180 MG (400 UNITS) capsule, Take 400 Units by mouth daily., Disp: , Rfl:    dextroamphetamine (DEXTROSTAT) 10 MG tablet, Take 1 tablet (10 mg total) by mouth in the morning., Disp: 30 tablet, Rfl: 0   ibuprofen (ADVIL) 800 MG tablet, Take 800 mg by mouth daily. (Patient not taking: No sig reported), Disp: , Rfl:    lamoTRIgine (  LAMICTAL) 200 MG tablet, Take 1 tablet (200 mg total) by mouth 2 (two) times daily., Disp: 180 tablet, Rfl: 1  Current Facility-Administered Medications:    cyanocobalamin ((VITAMIN B-12)) injection 1,000 mcg, 1,000 mcg, Intramuscular, Q30 days, Shawnee Knapp, MD, 1,000 mcg at 07/12/18 1652 Medication Side Effects: none  Family Medical/ Social History: Changes? No  MENTAL HEALTH EXAM:  There were no vitals taken for this visit.There is no height or weight on file to calculate BMI.  General Appearance: Casual  Eye Contact:   Good  Speech:  Clear and Coherent  Volume:  Normal  Mood:  Anxious, Depressed, Dysphoric, and Hopeless  Affect:  Depressed, Flat, and Anxious  Thought Process:  Coherent  Orientation:  Full (Time, Place, and Person)  Thought Content: Logical   Suicidal Thoughts:  No  Homicidal Thoughts:  No  Memory:  WNL  Judgement:  Good  Insight:  Good  Psychomotor Activity:  Normal  Concentration:  Concentration: Good  Recall:  Good  Fund of Knowledge: Good  Language: Good  Assets:  Desire for Improvement  ADL's:  Intact  Cognition: WNL  Prognosis:  Good    DIAGNOSES:    ICD-10-CM   1. Bipolar disorder with depression ([place])  F31.9 lamoTRIgine (LAMICTAL) 200 MG tablet    lurasidone (LATUDA) 20 MG TABS tablet    2. Fatigue, unspecified type  R53.83 dextroamphetamine (DEXTROSTAT) 10 MG tablet    3. Insomnia due to other mental disorder  F51.05    F99     4. Generalized anxiety disorder  F41.1 lamoTRIgine (LAMICTAL) 200 MG tablet    lurasidone (LATUDA) 20 MG TABS tablet    5. Melancholy  F32.A dextroamphetamine (DEXTROSTAT) 10 MG tablet    6. Bipolar disorder, in partial remission, most recent episode depressed ([facility]CC)  F31.75 lamoTRIgine (LAMICTAL) 200 MG tablet    lurasidone (LATUDA) 20 MG TABS tablet      Receiving Psychotherapy: No    RECOMMENDATIONS:  Greater than 50% of 30 min face to face time with patient was spent on counseling and coordination of care. We discussed her long history of Bipolar disorder. We discussed history of cycling between depression and mania. Last manic episode was 6 months ago. Discussed her previous plan of care with Donnal Moat. Explained that I was not comfortable increasing her Dexedrine this visit when starting another new med. Discussed xanax with her and told her we would have to come up with long term plan to wean.   PDMP was reviewed.   Start Latuda 20 mg daily. #28 Latuda tablets sample provided. Escribed #30 to patients pharmacy. Continue  hydroxyzine 25 mg nightly 8H as needed. To reduce Lamictal  to 350 mg daily. 200 mg in the am and 150 mg in the evening. Continue trazodone 300 mg, 1.5 pills nightly.   Instructed pt that she must consume 350 cal meal when taking latuda Advised her to follow up with PCP or Cardiologist due to BP not being controlled. She says that her BP does not normally run this high I advised her that stimulants can further elevate BP and she needs to check BP on regular basis at least multiple times per week. Return in 4 weeks to reassess. Discussed potential benefits, risks, and side effects of stimulants with patient to include increased heart rate, palpitations, insomnia, increased anxiety, increased irritability, or decreased appetite.  Instructed patient to contact office if experiencing any significant tolerability issues.  Elwanda Brooklyn, NP

## 2021-05-31 DIAGNOSIS — M160 Bilateral primary osteoarthritis of hip: Secondary | ICD-10-CM | POA: Diagnosis not present

## 2021-06-08 ENCOUNTER — Ambulatory Visit: Payer: PPO | Admitting: Physician Assistant

## 2021-06-10 ENCOUNTER — Telehealth: Payer: Self-pay | Admitting: Family Medicine

## 2021-06-10 NOTE — Chronic Care Management (AMB) (Signed)
  Chronic Care Management   Outreach Note  06/10/2021 Name: Becky Lawson MRN: [id number] DOB: March 25, 1951  Referred by: Wendie Agreste, MD Reason for referral : No chief complaint on file.   An unsuccessful telephone outreach was attempted today. The patient was referred to the pharmacist for assistance with care management and care coordination.   Follow Up Plan:   Tatjana Dellinger Upstream Scheduler

## 2021-06-17 ENCOUNTER — Telehealth: Payer: Self-pay | Admitting: Family Medicine

## 2021-06-17 NOTE — Chronic Care Management (AMB) (Signed)
  Chronic Care Management   Note  06/17/2021 Name: KAYTLYNNE NEACE MRN: [id number] DOB: 1951/08/17  YOLONDA PURTLE is a 70 y.o. year old female who is a primary care patient of [name] Ranell Patrick, MD. I reached out to Emily Filbert by phone today in response to a referral sent by Ms. Ines Bloomer Wares's PCP, Wendie Agreste, MD.   Ms. Wigington was given information about Chronic Care Management services today including:  CCM service includes personalized support from designated clinical staff supervised by her physician, including individualized plan of care and coordination with other care providers 24/7 contact phone numbers for assistance for urgent and routine care needs. Service will only be billed when office clinical staff spend 20 minutes or more in a month to coordinate care. Only one practitioner may furnish and bill the service in a calendar month. The patient may stop CCM services at any time (effective at the end of the month) by phone call to the office staff.   Patient agreed to services and verbal consent obtained.   Follow up plan:   Tatjana Secretary/administrator

## 2021-06-24 ENCOUNTER — Ambulatory Visit: Payer: PPO | Admitting: Behavioral Health

## 2021-06-24 ENCOUNTER — Other Ambulatory Visit: Payer: Self-pay

## 2021-06-24 DIAGNOSIS — F5105 Insomnia due to other mental disorder: Secondary | ICD-10-CM | POA: Diagnosis not present

## 2021-06-24 DIAGNOSIS — R5383 Other fatigue: Secondary | ICD-10-CM | POA: Diagnosis not present

## 2021-06-24 DIAGNOSIS — F99 Mental disorder, not otherwise specified: Secondary | ICD-10-CM | POA: Diagnosis not present

## 2021-06-24 DIAGNOSIS — F411 Generalized anxiety disorder: Secondary | ICD-10-CM

## 2021-06-24 DIAGNOSIS — F32A Depression, unspecified: Secondary | ICD-10-CM | POA: Diagnosis not present

## 2021-06-24 DIAGNOSIS — F3162 Bipolar disorder, current episode mixed, moderate: Secondary | ICD-10-CM | POA: Diagnosis not present

## 2021-06-24 MED ORDER — LATUDA 60 MG PO TABS: 60.0000 mg | ORAL_TABLET | Freq: Every day | ORAL | 2 refills | Status: DC

## 2021-06-24 MED ORDER — DEXTROAMPHETAMINE SULFATE ER 15 MG PO CP24: 15.0000 mg | ORAL_CAPSULE | Freq: Every day | ORAL | 0 refills | Status: DC

## 2021-06-24 NOTE — Progress Notes (Signed)
[facility]  Patient ID: Becky Lawson,  MRN: [id number]  PCP: Wendie Agreste, MD  Date of Evaluation: 06/24/2021 Time spent:30 minutes  Chief Complaint:  Chief Complaint   Anxiety; Follow-up; Medication Refill; Medication Problem; Manic Behavior; Depression     HISTORY/CURRENT STATUS: HPI  70 year old female presents to this office for follow up and medication management. She is prior patient of Donnal Moat. She says that she has started experiencing rapid cycling with moods since starting Latuda 20 mg. She feels like she needs to increase the dose since she has previously had success with Latuda in the past. Still says that her energy levels are so low in the day that she cannot accomplish task. She says that without the Dexedrine she feels like doing nothing and would like to increase the dose. She reports her anxiety today at 3/10 and depression at 7/10. She is sleeping 7-8 hours with aid of medication. She does have chronic pain in right hip that requires pain medication for her to be comfortable ambulating. She has surgery scheduled for her left hip in 30 days. She is endorsing recent mixed episodes of mania and depression. No current psychosis. No SI/HI.   Past medications for mental health diagnoses include: Lithium, Seroquel, Effexor, Prozac, Latuda, Abilify, Dexedrine, Cymbalta caused weight gain.  Modafinil.   Individual Medical History/ Review of Systems: Changes? :No   Allergies: Patient has no known allergies.  Current Medications:  Current Outpatient Medications:    dextroamphetamine (DEXEDRINE SPANSULE) 15 MG 24 hr capsule, Take 1 capsule (15 mg total) by mouth daily., Disp: 30 capsule, Rfl: 0   Lurasidone HCl (LATUDA) 60 MG TABS, Take 1 tablet (60 mg total) by mouth daily., Disp: 30 tablet, Rfl: 2   ALPRAZolam (XANAX) 1 MG tablet, Take 1 tablet (1 mg total) by mouth at bedtime as needed for anxiety., Disp: 30 tablet, Rfl: 1   amLODipine (NORVASC) 5  MG tablet, Take 1 tablet (5 mg total) by mouth daily., Disp: 90 tablet, Rfl: 1   Cholecalciferol (VITAMIN D-3) 5000 units TABS, Take 5,000 Units daily by mouth. In addition to the rx high dose once weekly vitamin D of 50Ku., Disp: 30 tablet, Rfl:    dextroamphetamine (DEXTROSTAT) 10 MG tablet, Take 1 tablet (10 mg total) by mouth in the morning., Disp: 30 tablet, Rfl: 0   fluticasone (FLONASE) 50 MCG/ACT nasal spray, Place 2 sprays into both nostrils daily., Disp: 16 g, Rfl: 6   HYDROcodone-acetaminophen (NORCO) 10-325 MG tablet, Take 1 tablet by mouth 3 (three) times daily. , Disp: , Rfl:    hydrOXYzine (ATARAX/VISTARIL) 25 MG tablet, Take 1 tablet (25 mg total) by mouth every 8 (eight) hours as needed., Disp: 60 tablet, Rfl: 5   ibuprofen (ADVIL) 800 MG tablet, Take 800 mg by mouth daily. (Patient not taking: No sig reported), Disp: , Rfl:    lamoTRIgine (LAMICTAL) 200 MG tablet, Take 1 tablet (200 mg total) by mouth 2 (two) times daily., Disp: 180 tablet, Rfl: 1   levothyroxine (SYNTHROID) 100 MCG tablet, TAKE 1 TABLET BY MOUTH DAILY BEFORE BREAKFAST., Disp: 90 tablet, Rfl: 1   omeprazole (PRILOSEC) 40 MG capsule, Take 1 capsule (40 mg total) by mouth 2 (two) times daily., Disp: 180 capsule, Rfl: 1   pravastatin (PRAVACHOL) 40 MG tablet, Take 1 tablet (40 mg total) by mouth daily., Disp: 90 tablet, Rfl: 3   trazodone (DESYREL) 300 MG tablet, Take 1.5 tablets (450 mg total) by mouth at bedtime.,  Disp: 135 tablet, Rfl: 1   Vitamin D, Ergocalciferol, (DRISDOL) 1.25 MG (50000 UNIT) CAPS capsule, Take 1 capsule (50,000 Units total) by mouth every 7 (seven) days. Sunday, Disp: 12 capsule, Rfl: 3   vitamin E 180 MG (400 UNITS) capsule, Take 400 Units by mouth daily., Disp: , Rfl:   Current Facility-Administered Medications:    cyanocobalamin ((VITAMIN B-12)) injection 1,000 mcg, 1,000 mcg, Intramuscular, Q30 days, Shawnee Knapp, MD, 1,000 mcg at 07/12/18 1652 Medication Side Effects: none  Family  Medical/ Social History: Changes? No  MENTAL HEALTH EXAM:  There were no vitals taken for this visit.There is no height or weight on file to calculate BMI.  General Appearance: Casual and Neat  Eye Contact:  Good  Speech:  Clear and Coherent  Volume:  Normal  Mood:  Anxious and Depressed  Affect:  Appropriate  Thought Process:  Coherent  Orientation:  Full (Time, Place, and Person)  Thought Content: Logical   Suicidal Thoughts:  No  Homicidal Thoughts:  No  Memory:  WNL  Judgement:  Good  Insight:  Good  Psychomotor Activity:  Normal  Concentration:  Concentration: Good  Recall:  Good  Fund of Knowledge: Good  Language: Good  Assets:  Desire for Improvement  ADL's:  Intact  Cognition: WNL  Prognosis:  Good    DIAGNOSES:    ICD-10-CM   1. Fatigue, unspecified type  R53.83 dextroamphetamine (DEXEDRINE SPANSULE) 15 MG 24 hr capsule    2. Insomnia due to other mental disorder  F51.05    F99     3. Generalized anxiety disorder  F41.1 Lurasidone HCl (LATUDA) 60 MG TABS    4. Melancholy  F32.A dextroamphetamine (DEXEDRINE SPANSULE) 15 MG 24 hr capsule    Lurasidone HCl (LATUDA) 60 MG TABS    5. Bipolar 1 disorder, mixed, moderate (HCC)  F31.62 Lurasidone HCl (LATUDA) 60 MG TABS      Receiving Psychotherapy: No    RECOMMENDATIONS:   Greater than 50% of 30 min face to face time with patient was spent on counseling and coordination of care. We her recent trial of Latuda on 20 mg. Says that she has been experiencing rapid cycling with moods from depression to mania over the last few weeks. Discussed xanax with her and told her we would have to come up with long term plan to wean.   PDMP was reviewed.   Increased  Latuda to 40 mg daily for 5 days, then to increase to 60 mg daily until next visit.  #28 day supply samples of 40 mg Latuda provided. Continue hydroxyzine 25 mg nightly 8H as needed. To reduce Lamictal  to 350 mg daily. 200 mg in the am and 150 mg in the  evening. Continue trazodone 300 mg, 1.5 pills nightly.   Instructed pt that she must consume 350 cal meal when taking latuda Return in 6 weeks to reassess. Discussed potential benefits, risks, and side effects of stimulants with patient to include increased heart rate, palpitations, insomnia, increased anxiety, increased irritability, or decreased appetite.  Instructed patient to contact office if experiencing any significant tolerability issues.  Discussed potential metabolic side effects associated with atypical antipsychotics, as well as potential risk for movement side effects. Advised pt to contact office if movement side effects occur.       Elwanda Brooklyn, NP

## 2021-07-01 ENCOUNTER — Other Ambulatory Visit: Payer: Self-pay | Admitting: Behavioral Health

## 2021-07-01 ENCOUNTER — Telehealth: Payer: Self-pay | Admitting: Behavioral Health

## 2021-07-01 ENCOUNTER — Other Ambulatory Visit: Payer: Self-pay | Admitting: Physician Assistant

## 2021-07-01 DIAGNOSIS — F32A Depression, unspecified: Secondary | ICD-10-CM

## 2021-07-01 DIAGNOSIS — R5383 Other fatigue: Secondary | ICD-10-CM

## 2021-07-01 MED ORDER — DEXTROAMPHETAMINE SULFATE 10 MG PO TABS: 10.0000 mg | ORAL_TABLET | Freq: Every morning | ORAL | 0 refills | Status: DC

## 2021-07-01 NOTE — Telephone Encounter (Signed)
Complete, please inform

## 2021-07-01 NOTE — Telephone Encounter (Signed)
Pt LVM stating that should could not afford the Dextroamphetamine 15mg   that Aaron Edelman prescribed for her.  She would like him to refill the 10mg  Dextroamphetamine (not XR), which she can afford.   Next appt 11/28

## 2021-07-01 NOTE — Telephone Encounter (Signed)
Just fyi on the Dextroamphetamine 15 mg ER #30 it costs $36.02 at [facility]. Not sure what her cost was just read the message if that is an option.

## 2021-07-01 NOTE — Telephone Encounter (Signed)
Pt informed

## 2021-07-01 NOTE — Telephone Encounter (Signed)
Yes, I thought about that. There is hardly no cost difference between the two strengths. I already changed it for her but can reach back out.

## 2021-07-01 NOTE — Telephone Encounter (Signed)
Please review

## 2021-07-02 NOTE — Telephone Encounter (Signed)
Please send if appropriate.

## 2021-07-08 DIAGNOSIS — Z5181 Encounter for therapeutic drug level monitoring: Secondary | ICD-10-CM | POA: Diagnosis not present

## 2021-07-08 DIAGNOSIS — Z79899 Other long term (current) drug therapy: Secondary | ICD-10-CM | POA: Diagnosis not present

## 2021-07-19 ENCOUNTER — Ambulatory Visit (INDEPENDENT_AMBULATORY_CARE_PROVIDER_SITE_OTHER): Payer: PPO | Admitting: Family Medicine

## 2021-07-19 VITALS — BP 138/66 | HR 78 | Temp 98.3°F | Resp 16 | Ht 64.0 in | Wt 193.6 lb

## 2021-07-19 DIAGNOSIS — E890 Postprocedural hypothyroidism: Secondary | ICD-10-CM | POA: Diagnosis not present

## 2021-07-19 DIAGNOSIS — I10 Essential (primary) hypertension: Secondary | ICD-10-CM | POA: Diagnosis not present

## 2021-07-19 DIAGNOSIS — E041 Nontoxic single thyroid nodule: Secondary | ICD-10-CM

## 2021-07-19 DIAGNOSIS — E785 Hyperlipidemia, unspecified: Secondary | ICD-10-CM

## 2021-07-19 DIAGNOSIS — I700 Atherosclerosis of aorta: Secondary | ICD-10-CM | POA: Diagnosis not present

## 2021-07-19 DIAGNOSIS — Z23 Encounter for immunization: Secondary | ICD-10-CM

## 2021-07-19 MED ORDER — AMLODIPINE BESYLATE 5 MG PO TABS: 5.0000 mg | ORAL_TABLET | Freq: Every day | ORAL | 1 refills | Status: DC

## 2021-07-19 MED ORDER — LEVOTHYROXINE SODIUM 100 MCG PO TABS: ORAL_TABLET | ORAL | 1 refills | Status: DC

## 2021-07-19 NOTE — Progress Notes (Signed)
Anesthesia Review:  PCP: Cardiologist : Chest x-ray : EKG : Echo : Stress test: Cardiac Cath :  Activity level:  Sleep Study/ CPAP : Fasting Blood Sugar :      / Checks Blood Sugar -- times a day:   Blood Thinner/ Instructions /Last Dose: ASA / Instructions/ Last Dose :  

## 2021-07-19 NOTE — Progress Notes (Signed)
Subjective:  Patient ID: Becky Lawson, female    DOB: 1950/12/17  Age: 70 y.o. MRN: [id number]  CC:  Chief Complaint  Patient presents with   Hypertension    Pt reports BP monitor reporting high at home after in office readings were normal    Jaw Pain    Pt reports pain has been significantly better    Other    Atherosclerosis of aorta pt due for review     HPI Becky Lawson presents for   Hypertension: Amlodipine 5 mg daily, no new side effects.  Home readings slightly higher on her machine compared to an office readings. History of fatigue thought to be multifactorial including use of her medications, pain medication with spinal stenosis.  Followed by pain management. Has had to increase pain meds d/t hip pain - surgery on 11/29 - Dr. Alvan Dame, L hip replacement.  BP Readings from Last 3 Encounters:  07/19/21 138/66  04/19/21 122/78  01/14/21 (!) 142/68   Lab Results  Component Value Date   CREATININE 0.86 01/14/2021   Aortic atherosclerosis  She is currently on pravastatin 40 mg daily.no new side effects. No other statins in past.  Aortic atherosclerosis noted on abdominal imaging in September 2020. Lab Results  Component Value Date   CHOL 192 01/14/2021   HDL 50.70 01/14/2021   LDLCALC 112 (H) 01/14/2021   TRIG 150.0 (H) 01/14/2021   CHOLHDL 4 01/14/2021    Thyroid nodule status post partial thyroidectomy, postoperative hypothyroidism.   Previously discussed referral to general surgery to evaluate for possible FNA/biopsy of the left thyroid abnormality at the resection bed.  She has deferred this on previous visits including most recently in August. Lab Results  Component Value Date   TSH 1.55 01/14/2021  Synthroid 130mcg qd.  Taking medication daily.  No new hot or cold intolerance. No new hair or skin changes, heart palpitations or new fatigue. No new weight changes.  Agrees to be seen by surgery now.  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  Improved at this time. Thinks that tighter spare glasses may have been issue.    History Patient Active Problem List   Diagnosis Date Noted   Abnormal liver function tests 04/19/2021   Degeneration of lumbar intervertebral disc 05/25/2020   Pain of left hip joint 03/26/2020   Constipation 05/10/2019   Change in bowel habit 05/10/2019   Dysphagia 05/10/2019   Tremor 07/12/2018   Prediabetes 07/12/2018   Polypharmacy 07/12/2018   Unintentional weight loss of more than 10 pounds 07/12/2018   OSA (obstructive sleep apnea) 06/02/2018   Fatty liver 04/18/2017   GERD (gastroesophageal reflux disease) 10/28/2016   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   Chronic throat clearing 06/24/2016   S/P partial thyroidectomy 06/16/2016   Multinodular goiter 06/02/2016   Thyroid nodule 02/08/2016   Hx of hepatitis C 05/19/2015   Vitamin D deficiency 05/19/2015   Spinal stenosis of lumbar region 05/19/2015   Hyperlipidemia 05/19/2015   Bipolar disorder ([place]) 05/19/2015   Essential hypertension 09/26/2013   Undiagnosed cardiac murmurs 09/26/2013   Past Medical History:  Diagnosis Date   Anxiety    Arthritis    Benign cyst of right kidney 2014   2.7 cm right kidney cyst seen on imaging 2014 but was c/o right flank pain with new persistent proteinuria (fortunately hematuria had resolved) so repeat US 11/2016 showed right kidney still with 2.6 cm simple cyst and otherwise nml.   Bipolar  1 disorder ([place])    Cancer ([place])    Phreesia 06/15/2020   Chronic kidney disease    Depression    Depression    Phreesia 06/15/2020   GERD (gastroesophageal reflux disease)    Heart murmur    Hepatitis C    resolved completely after treatment in 2014, genotype 1b, followed at [facility]   Hyperlipidemia    Hypertension    Jaundice 11/01/2012   Pruritic disorder 02/13/2013   Sleep apnea    Was diagnosed approximately 20 years ago, but does not wear CPAP patient stated "I gave it back.Marland KitchenMarland KitchenI couldn't  wear that thing it was horrible"   Spinal stenosis    Thyroid disease    Phreesia 06/15/2020   Past Surgical History:  Procedure Laterality Date   ABDOMINAL HYSTERECTOMY     COLONOSCOPY W/ POLYPECTOMY     JOINT REPLACEMENT Bilateral    knee   KNEE ARTHROSCOPY Bilateral    PARTIAL HYSTERECTOMY     THYROID LOBECTOMY Left 06/16/2016   THYROID LOBECTOMY Left 06/16/2016   Procedure: LEFT THYROID LOBECTOMY;  Surgeon: Greer Pickerel, MD;  Location: [facility];  Service: General;  Laterality: Left;   No Known Allergies Prior to Admission medications   Medication Sig Start Date End Date Taking? Authorizing Provider  ALPRAZolam (XANAX) 1 MG tablet TAKE 1 TABLET BY MOUTH 2 TIMES DAILY AS NEEDED FOR ANXIETY. Patient taking differently: Take 1 mg by mouth daily. 07/05/21  Yes White, Aaron Edelman A, NP  amLODipine (NORVASC) 5 MG tablet Take 1 tablet (5 mg total) by mouth daily. 01/14/21  Yes Wendie Agreste, MD  Cholecalciferol (VITAMIN D-3) 5000 units TABS Take 5,000 Units daily by mouth. In addition to the rx high dose once weekly vitamin D of 50Ku. 07/24/17  Yes Shawnee Knapp, MD  dextroamphetamine (DEXTROSTAT) 10 MG tablet Take 1 tablet (10 mg total) by mouth in the morning. 07/01/21 07/31/21 Yes White, Louanna Raw, NP  fluticasone (FLONASE) 50 MCG/ACT nasal spray Place 2 sprays into both nostrils daily. 04/19/21  Yes Wendie Agreste, MD  HYDROcodone-acetaminophen [facility]) 10-325 MG tablet Take 1 tablet by mouth every 6 (six) hours as needed for moderate pain.   Yes [provider]  hydrOXYzine (ATARAX/VISTARIL) 25 MG tablet Take 1 tablet (25 mg total) by mouth every 8 (eight) hours as needed. 01/18/21  Yes Hurst, Dorothea Glassman, PA-C  lamoTRIgine (LAMICTAL) 200 MG tablet Take 1 tablet (200 mg total) by mouth 2 (two) times daily. 05/27/21  Yes White, Aaron Edelman A, NP  levothyroxine (SYNTHROID) 100 MCG tablet TAKE 1 TABLET BY MOUTH DAILY BEFORE BREAKFAST. 04/19/21  Yes Wendie Agreste, MD  Lurasidone HCl (LATUDA) 60 MG  TABS Take 1 tablet (60 mg total) by mouth daily. 06/24/21  Yes Elwanda Brooklyn, NP  omeprazole (PRILOSEC) 40 MG capsule Take 1 capsule (40 mg total) by mouth 2 (two) times daily. Patient taking differently: Take 40 mg by mouth daily. 04/19/21  Yes Wendie Agreste, MD  pravastatin (PRAVACHOL) 40 MG tablet Take 1 tablet (40 mg total) by mouth daily. 04/19/21  Yes Wendie Agreste, MD  trazodone (DESYREL) 300 MG tablet Take 1.5 tablets (450 mg total) by mouth at bedtime. Patient taking differently: Take 300 mg by mouth at bedtime. 02/25/21  Yes Hurst, Helene Kelp T, PA-C  Vitamin D, Ergocalciferol, (DRISDOL) 1.25 MG (50000 UNIT) CAPS capsule Take 1 capsule (50,000 Units total) by mouth every 7 (seven) days. Sunday 04/19/21  Yes Wendie Agreste, MD   Social  History   Socioeconomic History   Marital status: Divorced    Spouse name: Not on file   Number of children: Not on file   Years of education: Not on file   Highest education level: Not on file  Occupational History   Occupation: Personal Caregiver  Tobacco Use   Smoking status: Former    Packs/day: 1.00    Years: 15.00    Pack years: 15.00    Types: Cigarettes    Quit date: 06/30/1979    Years since quitting: 42.0   Smokeless tobacco: Never  Vaping Use   Vaping Use: Never used  Substance and Sexual Activity   Alcohol use: Yes    Alcohol/week: 0.0 - 1.0 standard drinks    Comment: 1-2 per month   Drug use: No   Sexual activity: Yes    Birth control/protection: None  Other Topics Concern   Not on file  Social History Narrative   Divorced. Education: [employer]. Exercise: No.   Social Determinants of Health   Financial Resource Strain: Not on file  Food Insecurity: Not on file  Transportation Needs: Not on file  Physical Activity: Not on file  Stress: Not on file  Social Connections: Not on file  Intimate Partner Violence: Not on file    Review of Systems  Constitutional:  Positive for fatigue (chronic). Negative for  unexpected weight change.  Respiratory:  Negative for chest tightness and shortness of breath.   Cardiovascular:  Negative for chest pain, palpitations and leg swelling.  Gastrointestinal:  Negative for abdominal pain and blood in stool.  Neurological:  Negative for dizziness, syncope, light-headedness and headaches.    Objective:   Vitals:   07/19/21 1446  BP: 138/66  Pulse: 78  Resp: 16  Temp: 98.3 F (36.8 C)  TempSrc: Temporal  SpO2: 97%  Weight: 193 lb 9.6 oz (87.8 kg)  Height: 5\' 4"  (1.626 m)     Physical Exam Vitals reviewed.  Constitutional:      Appearance: Normal appearance. She is well-developed.  HENT:     Head: Normocephalic and atraumatic.  Eyes:     Conjunctiva/sclera: Conjunctivae normal.     Pupils: Pupils are equal, round, and reactive to light.  Neck:     Vascular: No carotid bruit.  Cardiovascular:     Rate and Rhythm: Normal rate and regular rhythm.     Heart sounds: Normal heart sounds.  Pulmonary:     Effort: Pulmonary effort is normal.     Breath sounds: Normal breath sounds.  Abdominal:     Palpations: Abdomen is soft. There is no pulsatile mass.     Tenderness: There is no abdominal tenderness.  Musculoskeletal:     Right lower leg: No edema.     Left lower leg: No edema.  Skin:    General: Skin is warm and dry.  Neurological:     Mental Status: She is alert and oriented to person, place, and time.  Psychiatric:        Mood and Affect: Mood normal.        Behavior: Behavior normal.     Assessment & Plan:  HAWA HENLY is a 70 y.o. female . Essential hypertension - Plan: amLODipine (NORVASC) 5 MG tablet, Comprehensive metabolic panel  -Stable in office, lower than home readings.  No med changes at this time.  Would be cautious on increasing meds given her underlying fatigue.  RTC precautions.  Atherosclerosis of aorta ([place]) - Plan: Lipid panel  -Updated  lipid panel ordered, continue statin.  Option of Crestor or Lipitor if  persistent elevation.  Need for influenza vaccination - Plan: Flu Vaccine QUAD High Dose(Fluad)  Left thyroid nodule - Plan: Ambulatory referral to General Surgery Postoperative hypothyroidism - Plan: levothyroxine (SYNTHROID) 100 MCG tablet  -Refer to general surgeon to decide on biopsy/evaluation of previous small nodule post resection.  Continue same dose Synthroid for now.  Hyperlipidemia, unspecified hyperlipidemia type - Plan: Lipid panel  -As above, repeat lipid panel.  Consider stronger statin if needed.  Meds ordered this encounter  Medications   amLODipine (NORVASC) 5 MG tablet    Sig: Take 1 tablet (5 mg total) by mouth daily.    Dispense:  90 tablet    Refill:  1   levothyroxine (SYNTHROID) 100 MCG tablet    Sig: TAKE 1 TABLET BY MOUTH DAILY BEFORE BREAKFAST.    Dispense:  90 tablet    Refill:  1   Patient Instructions  Thanks for coming in today.  I will check some lab work.  Good luck with upcoming surgery.  I can still complete the form for fitness facility if needed, but would recommend specific restrictions to be discussed with your orthopedist and physical therapy.  If any concerns on labs I will let you know.  I will refer you to general surgeon to discuss the thyroid nodule and further work-up if needed.    Signed,   Merri Ray, MD [place] 07/19/21 5:39 PM

## 2021-07-19 NOTE — Progress Notes (Signed)
DUE TO COVID-19 ONLY ONE VISITOR IS ALLOWED TO COME WITH YOU AND STAY IN THE WAITING ROOM ONLY DURING PRE OP AND PROCEDURE DAY OF SURGERY.  2 VISITOR  MAY VISIT WITH YOU AFTER SURGERY IN YOUR PRIVATE ROOM DURING VISITING HOURS ONLY!  YOU NEED TO HAVE A COVID 19 TEST ON___ ___@_  @_from  8am-3pm _____, THIS TEST MUST BE DONE BEFORE SURGERY,  Covid test is done at [place], [place] [address].  This is a drive thru.  No appt required. Please see map.                 Your procedure is scheduled on:  08/03/2021   Report to [facility] Main  Entrance   Report to admitting at    1135AM     Call this number if you have problems the morning of surgery [phone]    REMEMBER: NO  SOLID FOOD CANDY OR GUM AFTER MIDNIGHT. CLEAR LIQUIDS UNTIL   1120am         . NOTHING BY MOUTH EXCEPT CLEAR LIQUIDS UNTIL 1120am    . PLEASE FINISH ENSURE DRINK PER SURGEON ORDER  WHICH NEEDS TO BE COMPLETED AT  1120am    .      CLEAR LIQUID DIET   Foods Allowed                                                                    Coffee and tea, regular and decaf                            Fruit ices (not with fruit pulp)                                      Iced Popsicles                                    Carbonated beverages, regular and diet                                    Cranberry, grape and apple juices Sports drinks like Gatorade Lightly seasoned clear broth or consume(fat free) Sugar, honey syrup ___________________________________________________________________      BRUSH YOUR TEETH MORNING OF SURGERY AND RINSE YOUR MOUTH OUT, NO CHEWING GUM CANDY OR MINTS.     Take these medicines the morning of surgery with A SIP OF WATER:   amlodipine, lamictal, synthroid, prilosec   DO NOT TAKE ANY DIABETIC MEDICATIONS DAY OF YOUR SURGERY                               You may not have any metal on your body including hair pins and              piercings  Do not wear jewelry,  make-up, lotions, powders or perfumes, deodorant  Do not wear nail polish on your fingernails.  Do not shave  48 hours prior to surgery.              Men may shave face and neck.   Do not bring valuables to the hospital. [place].  Contacts, dentures or bridgework may not be worn into surgery.  Leave suitcase in the car. After surgery it may be brought to your room.     Patients discharged the day of surgery will not be allowed to drive home. IF YOU ARE HAVING SURGERY AND GOING HOME THE SAME DAY, YOU MUST HAVE AN ADULT TO DRIVE YOU HOME AND BE WITH YOU FOR 24 HOURS. YOU MAY GO HOME BY TAXI OR UBER OR ORTHERWISE, BUT AN ADULT MUST ACCOMPANY YOU HOME AND STAY WITH YOU FOR 24 HOURS.  Name and phone number of your driver:  Special Instructions: N/A              Please read over the following fact sheets you were given: _____________________________________________________________________  [facility] - Preparing for Surgery Before surgery, you can play an important role.  Because skin is not sterile, your skin needs to be as free of germs as possible.  You can reduce the number of germs on your skin by washing with CHG (chlorahexidine gluconate) soap before surgery.  CHG is an antiseptic cleaner which kills germs and bonds with the skin to continue killing germs even after washing. Please DO NOT use if you have an allergy to CHG or antibacterial soaps.  If your skin becomes reddened/irritated stop using the CHG and inform your nurse when you arrive at Short Stay. Do not shave (including legs and underarms) for at least 48 hours prior to the first CHG shower.  You may shave your face/neck. Please follow these instructions carefully:  1.  Shower with CHG Soap the night before surgery and the  morning of Surgery.  2.  If you choose to wash your hair, wash your hair first as usual with your  normal  shampoo.  3.  After you shampoo, rinse  your hair and body thoroughly to remove the  shampoo.                           4.  Use CHG as you would any other liquid soap.  You can apply chg directly  to the skin and wash                       Gently with a scrungie or clean washcloth.  5.  Apply the CHG Soap to your body ONLY FROM THE NECK DOWN.   Do not use on face/ open                           Wound or open sores. Avoid contact with eyes, ears mouth and genitals (private parts).                       Wash face,  Genitals (private parts) with your normal soap.             6.  Wash thoroughly, paying special attention to the area where your surgery  will be performed.  7.  Thoroughly rinse your body with  warm water from the neck down.  8.  DO NOT shower/wash with your normal soap after using and rinsing off  the CHG Soap.                9.  Pat yourself dry with a clean towel.            10.  Wear clean pajamas.            11.  Place clean sheets on your bed the night of your first shower and do not  sleep with pets. Day of Surgery : Do not apply any lotions/deodorants the morning of surgery.  Please wear clean clothes to the hospital/surgery center.  FAILURE TO FOLLOW THESE INSTRUCTIONS MAY RESULT IN THE CANCELLATION OF YOUR SURGERY PATIENT SIGNATURE_________________________________  NURSE SIGNATURE__________________________________  ________________________________________________________________________

## 2021-07-19 NOTE — Patient Instructions (Signed)
Thanks for coming in today.  I will check some lab work.  Good luck with upcoming surgery.  I can still complete the form for fitness facility if needed, but would recommend specific restrictions to be discussed with your orthopedist and physical therapy.  If any concerns on labs I will let you know.  I will refer you to general surgeon to discuss the thyroid nodule and further work-up if needed.

## 2021-07-19 NOTE — Progress Notes (Addendum)
Anesthesia Review:  PCP: DR Merri Ray  Cardiologist : none  Chest x-ray : EKG :07/21/2021  Echo : Stress test: Cardiac Cath :  Activity level: can do a flight of stairs wihout difficulty  Sleep Study/ CPAP :has sleep apnea- no cpap  Fasting Blood Sugar :      / Checks Blood Sugar -- times a day:   Blood Thinner/ Instructions /Last Dose: ASA / Instructions/ Last Dose :  Covid test- DOS- pt to arrive 3 hours early

## 2021-07-21 ENCOUNTER — Encounter (HOSPITAL_COMMUNITY)
Admission: RE | Admit: 2021-07-21 | Discharge: 2021-07-21 | Disposition: A | Discharge status: Home / Self Care | Payer: PPO | Source: Ambulatory Visit | Attending: Orthopedic Surgery | Admitting: Orthopedic Surgery

## 2021-07-21 ENCOUNTER — Encounter (HOSPITAL_COMMUNITY): Payer: Self-pay

## 2021-07-21 ENCOUNTER — Other Ambulatory Visit: Payer: Self-pay

## 2021-07-21 VITALS — BP 142/82 | HR 96 | Temp 98.4°F | Resp 16 | Ht 64.5 in | Wt 193.0 lb

## 2021-07-21 DIAGNOSIS — Z01818 Encounter for other preprocedural examination: Secondary | ICD-10-CM | POA: Insufficient documentation

## 2021-07-21 HISTORY — DX: Hypothyroidism, unspecified: E03.9

## 2021-07-21 LAB — TYPE AND SCREEN
ABO/RH(D): A NEG
Antibody Screen: NEGATIVE

## 2021-07-21 LAB — CBC
HCT: 42.2 % (ref 36.0–46.0)
Hemoglobin: 13.7 g/dL (ref 12.0–15.0)
MCH: 30.6 pg (ref 26.0–34.0)
MCHC: 32.5 g/dL (ref 30.0–36.0)
MCV: 94.2 fL (ref 80.0–100.0)
Platelets: 289 10*3/uL (ref 150–400)
RBC: 4.48 MIL/uL (ref 3.87–5.11)
RDW: 12.1 % (ref 11.5–15.5)
WBC: 5.9 10*3/uL (ref 4.0–10.5)
nRBC: 0 % (ref 0.0–0.2)

## 2021-07-21 LAB — COMPREHENSIVE METABOLIC PANEL
ALT: 10 U/L (ref 0–44)
AST: 16 U/L (ref 15–41)
Albumin: 4.4 g/dL (ref 3.5–5.0)
Alkaline Phosphatase: 74 U/L (ref 38–126)
Anion gap: 8 (ref 5–15)
BUN: 13 mg/dL (ref 8–23)
CO2: 26 mmol/L (ref 22–32)
Calcium: 9.2 mg/dL (ref 8.9–10.3)
Chloride: 102 mmol/L (ref 98–111)
Creatinine, Ser: 0.77 mg/dL (ref 0.44–1.00)
GFR, Estimated: >60 mL/min (ref >60)
Glucose, Bld: 147 mg/dL — ABNORMAL HIGH (ref 70–99)
Potassium: 4.2 mmol/L (ref 3.5–5.1)
Sodium: 136 mmol/L (ref 135–145)
Total Bilirubin: 0.5 mg/dL (ref 0.3–1.2)
Total Protein: 7 g/dL (ref 6.5–8.1)

## 2021-07-21 LAB — SURGICAL PCR SCREEN
MRSA, PCR: POSITIVE — AB
Staphylococcus aureus: POSITIVE — AB

## 2021-07-22 DIAGNOSIS — M25552 Pain in left hip: Secondary | ICD-10-CM | POA: Diagnosis not present

## 2021-07-22 DIAGNOSIS — M25559 Pain in unspecified hip: Secondary | ICD-10-CM | POA: Diagnosis not present

## 2021-08-02 ENCOUNTER — Ambulatory Visit: Payer: PPO | Admitting: Behavioral Health

## 2021-08-02 NOTE — H&P (Signed)
TOTAL HIP ADMISSION H&P  Patient is admitted for left total hip arthroplasty.  Subjective:  Chief Complaint: left hip pain  HPI: Becky Lawson, 70 y.o. female, has a history of pain and functional disability in the left hip(s) due to arthritis and patient has failed non-surgical conservative treatments for greater than 12 weeks to include NSAID's and/or analgesics and activity modification.  Onset of symptoms was gradual starting 2 years ago with gradually worsening course since that time.The patient noted no past surgery on the left hip(s).  Patient currently rates pain in the left hip at 8 out of 10 with activity. Patient has worsening of pain with activity and weight bearing, pain that interfers with activities of daily living, and pain with passive range of motion. Patient has evidence of joint space narrowing by imaging studies. This condition presents safety issues increasing the risk of falls. .  There is no current active infection.  Patient Active Problem List   Diagnosis Date Noted   Abnormal liver function tests 04/19/2021   Degeneration of lumbar intervertebral disc 05/25/2020   Pain of left hip joint 03/26/2020   Constipation 05/10/2019   Change in bowel habit 05/10/2019   Dysphagia 05/10/2019   Tremor 07/12/2018   Prediabetes 07/12/2018   Polypharmacy 07/12/2018   Unintentional weight loss of more than 10 pounds 07/12/2018   OSA (obstructive sleep apnea) 06/02/2018   Fatty liver 04/18/2017   GERD (gastroesophageal reflux disease) 10/28/2016   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   Chronic throat clearing 06/24/2016   S/P partial thyroidectomy 06/16/2016   Multinodular goiter 06/02/2016   Thyroid nodule 02/08/2016   Hx of hepatitis C 05/19/2015   Vitamin D deficiency 05/19/2015   Spinal stenosis of lumbar region 05/19/2015   Hyperlipidemia 05/19/2015   Bipolar disorder ([place]) 05/19/2015   Essential hypertension 09/26/2013   Undiagnosed cardiac murmurs 09/26/2013    Past Medical History:  Diagnosis Date   Arthritis    Benign cyst of right kidney 2014   2.7 cm right kidney cyst seen on imaging 2014 but was c/o right flank pain with new persistent proteinuria (fortunately hematuria had resolved) so repeat US 11/2016 showed right kidney still with 2.6 cm simple cyst and otherwise nml.   Bipolar 1 disorder ([place])    Cancer ([place])    Phreesia 06/15/2020   Chronic kidney disease    Depression    Depression    Phreesia 06/15/2020   GERD (gastroesophageal reflux disease)    Heart murmur    Hepatitis C    resolved completely after treatment in 2014, genotype 1b, followed at [facility]   Hyperlipidemia    Hypertension    Hypothyroidism    Jaundice 11/01/2012   Pruritic disorder 02/13/2013   Sleep apnea    Was diagnosed approximately 20 years ago, but does not wear CPAP patient stated "I gave it back.Marland KitchenMarland KitchenI couldn't wear that thing it was horrible"   Spinal stenosis    Thyroid disease    Phreesia 06/15/2020    Past Surgical History:  Procedure Laterality Date   ABDOMINAL HYSTERECTOMY     COLONOSCOPY W/ POLYPECTOMY     JOINT REPLACEMENT Bilateral    knee   KNEE ARTHROSCOPY Bilateral    PARTIAL HYSTERECTOMY     THYROID LOBECTOMY Left 06/16/2016   THYROID LOBECTOMY Left 06/16/2016   Procedure: LEFT THYROID LOBECTOMY;  Surgeon: Greer Pickerel, MD;  Location: [facility] OR;  Service: General;  Laterality: Left;    Current Facility-Administered Medications  Medication Dose Route Frequency Provider Last  Rate Last Admin   cyanocobalamin ((VITAMIN B-12)) injection 1,000 mcg  1,000 mcg Intramuscular Q30 days Shawnee Knapp, MD   1,000 mcg at 07/12/18 1652   Current Outpatient Medications  Medication Sig Dispense Refill Last Dose   ALPRAZolam (XANAX) 1 MG tablet TAKE 1 TABLET BY MOUTH 2 TIMES DAILY AS NEEDED FOR ANXIETY. (Patient taking differently: Take 1 mg by mouth daily.) 60 tablet 0    dextroamphetamine (DEXTROSTAT) 10 MG tablet Take 1 tablet (10 mg total) by mouth in  the morning. 30 tablet 0    HYDROcodone-acetaminophen (NORCO) 10-325 MG tablet Take 1 tablet by mouth every 6 (six) hours as needed for moderate pain.      hydrOXYzine (ATARAX/VISTARIL) 25 MG tablet Take 1 tablet (25 mg total) by mouth every 8 (eight) hours as needed. 60 tablet 5    lamoTRIgine (LAMICTAL) 200 MG tablet Take 1 tablet (200 mg total) by mouth 2 (two) times daily. 180 tablet 1    Lurasidone HCl (LATUDA) 60 MG TABS Take 1 tablet (60 mg total) by mouth daily. 30 tablet 2    omeprazole (PRILOSEC) 40 MG capsule Take 1 capsule (40 mg total) by mouth 2 (two) times daily. (Patient taking differently: Take 40 mg by mouth daily.) 180 capsule 1    pravastatin (PRAVACHOL) 40 MG tablet Take 1 tablet (40 mg total) by mouth daily. 90 tablet 3    trazodone (DESYREL) 300 MG tablet Take 1.5 tablets (450 mg total) by mouth at bedtime. (Patient taking differently: Take 300 mg by mouth at bedtime.) 135 tablet 1    Vitamin D, Ergocalciferol, (DRISDOL) 1.25 MG (50000 UNIT) CAPS capsule Take 1 capsule (50,000 Units total) by mouth every 7 (seven) days. Sunday 12 capsule 3    amLODipine (NORVASC) 5 MG tablet Take 1 tablet (5 mg total) by mouth daily. 90 tablet 1    Cholecalciferol (VITAMIN D-3) 5000 units TABS Take 5,000 Units daily by mouth. In addition to the rx high dose once weekly vitamin D of 50Ku. 30 tablet  Not Taking   fluticasone (FLONASE) 50 MCG/ACT nasal spray Place 2 sprays into both nostrils daily. 16 g 6 Not Taking   levothyroxine (SYNTHROID) 100 MCG tablet TAKE 1 TABLET BY MOUTH DAILY BEFORE BREAKFAST. 90 tablet 1    No Known Allergies  Social History   Tobacco Use   Smoking status: Former    Packs/day: 1.00    Years: 15.00    Pack years: 15.00    Types: Cigarettes    Quit date: 06/30/1979    Years since quitting: 42.1   Smokeless tobacco: Never  Substance Use Topics   Alcohol use: Not Currently    Alcohol/week: 0.0 - 1.0 standard drinks    Family History  Problem Relation Age of  Onset   Diabetes Mother    Heart disease Mother    Hyperlipidemia Mother    Hypertension Mother    Stroke Mother    Hypertension Father    Parkinson's disease Father    Heart disease Father    Hyperlipidemia Father    Hemachromatosis Daughter    Thyroid disease Neg Hx    Colon cancer Neg Hx    Esophageal cancer Neg Hx    Inflammatory bowel disease Neg Hx    Liver disease Neg Hx    Pancreatic cancer Neg Hx    Rectal cancer Neg Hx    Stomach cancer Neg Hx      Review of Systems  Constitutional:  Negative for  chills and fever.  Respiratory:  Negative for cough and shortness of breath.   Cardiovascular:  Negative for chest pain.  Gastrointestinal:  Negative for nausea and vomiting.  Musculoskeletal:  Positive for arthralgias.    Objective:  Physical Exam Well nourished and well developed. General: Alert and oriented x3, cooperative and pleasant, no acute distress. Head: normocephalic, atraumatic, neck supple. Eyes: EOMI.  Musculoskeletal: Bilateral hip exams: She is noted to have limited passive hip flexion internal rotation bilaterally left more symptomatic than right with 5 degrees of internal rotation with pelvic tilting and pain reproduced Limited active hip flexion due to pain with some noted external rotation contracture Neurovascular intact distally Mild tenderness around the hip girdles Neurovascular intact distally  Calves soft and nontender. Motor function intact in LE. Strength 5/5 LE bilaterally. Neuro: Distal pulses 2+. Sensation to light touch intact in LE.  Vital signs in last 24 hours:    Labs:   Estimated body mass index is 32.62 kg/m as calculated from the following:   Height as of 07/21/21: 5' 4.5" (1.638 m).   Weight as of 07/21/21: 87.5 kg.   Imaging Review Plain radiographs demonstrate severe degenerative joint disease of the left hip(s). The bone quality appears to be adequate for age and reported activity  level.      Assessment/Plan:  End stage arthritis, left hip(s)  The patient history, physical examination, clinical judgement of the provider and imaging studies are consistent with end stage degenerative joint disease of the left hip(s) and total hip arthroplasty is deemed medically necessary. The treatment options including medical management, injection therapy, arthroscopy and arthroplasty were discussed at length. The risks and benefits of total hip arthroplasty were presented and reviewed. The risks due to aseptic loosening, infection, stiffness, dislocation/subluxation,  thromboembolic complications and other imponderables were discussed.  The patient acknowledged the explanation, agreed to proceed with the plan and consent was signed. Patient is being admitted for inpatient treatment for surgery, pain control, PT, OT, prophylactic antibiotics, VTE prophylaxis, progressive ambulation and ADL's and discharge planning.The patient is planning to be discharged  home.  Therapy Plans: HEP Disposition: Home with daughter Planned DVT Prophylaxis: aspirin 81mg  BID DME needed: 3-n-1 PCP: Dr. Corliss Parish, appointment on 11/14 TXA: IV Allergies: NKDA Anesthesia Concerns: none BMI: 33.2 Last HgbA1c: Not diabetic  Other: - Chronic pain management (Oxycodone 10 q4h) -- [place] will manage post-op (Dilaudid) - Dilaudid, toradol, celebrex, tylenol, robaxin (admits difficulty with pain tolerance)   Costella Hatcher, PA-C Orthopedic Surgery [facility] [place] [phone]

## 2021-08-03 ENCOUNTER — Observation Stay (HOSPITAL_COMMUNITY)
Admission: RE | Admit: 2021-08-03 | Discharge: 2021-08-05 | Disposition: A | Discharge status: Home / Self Care | Payer: PPO | Attending: Orthopedic Surgery | Admitting: Orthopedic Surgery

## 2021-08-03 ENCOUNTER — Encounter (HOSPITAL_COMMUNITY): Payer: Self-pay | Admitting: Orthopedic Surgery

## 2021-08-03 ENCOUNTER — Ambulatory Visit (HOSPITAL_COMMUNITY): Payer: PPO

## 2021-08-03 ENCOUNTER — Ambulatory Visit (HOSPITAL_COMMUNITY): Payer: PPO | Admitting: Anesthesiology

## 2021-08-03 ENCOUNTER — Other Ambulatory Visit: Payer: Self-pay

## 2021-08-03 ENCOUNTER — Encounter (HOSPITAL_COMMUNITY)
Admission: RE | Disposition: A | Discharge status: Home / Self Care | Payer: Self-pay | Source: Home / Self Care | Attending: Orthopedic Surgery

## 2021-08-03 DIAGNOSIS — M1612 Unilateral primary osteoarthritis, left hip: Principal | ICD-10-CM | POA: Insufficient documentation

## 2021-08-03 DIAGNOSIS — E785 Hyperlipidemia, unspecified: Secondary | ICD-10-CM | POA: Diagnosis not present

## 2021-08-03 DIAGNOSIS — Z96653 Presence of artificial knee joint, bilateral: Secondary | ICD-10-CM | POA: Insufficient documentation

## 2021-08-03 DIAGNOSIS — N189 Chronic kidney disease, unspecified: Secondary | ICD-10-CM | POA: Diagnosis not present

## 2021-08-03 DIAGNOSIS — I129 Hypertensive chronic kidney disease with stage 1 through stage 4 chronic kidney disease, or unspecified chronic kidney disease: Secondary | ICD-10-CM | POA: Diagnosis not present

## 2021-08-03 DIAGNOSIS — Z859 Personal history of malignant neoplasm, unspecified: Secondary | ICD-10-CM | POA: Insufficient documentation

## 2021-08-03 DIAGNOSIS — M25552 Pain in left hip: Secondary | ICD-10-CM

## 2021-08-03 DIAGNOSIS — M1712 Unilateral primary osteoarthritis, left knee: Secondary | ICD-10-CM | POA: Diagnosis not present

## 2021-08-03 DIAGNOSIS — Z471 Aftercare following joint replacement surgery: Secondary | ICD-10-CM | POA: Diagnosis not present

## 2021-08-03 DIAGNOSIS — Z96649 Presence of unspecified artificial hip joint: Secondary | ICD-10-CM

## 2021-08-03 DIAGNOSIS — M25752 Osteophyte, left hip: Secondary | ICD-10-CM | POA: Diagnosis not present

## 2021-08-03 DIAGNOSIS — G4733 Obstructive sleep apnea (adult) (pediatric): Secondary | ICD-10-CM | POA: Insufficient documentation

## 2021-08-03 DIAGNOSIS — Z79899 Other long term (current) drug therapy: Secondary | ICD-10-CM | POA: Diagnosis not present

## 2021-08-03 DIAGNOSIS — Z01818 Encounter for other preprocedural examination: Secondary | ICD-10-CM

## 2021-08-03 DIAGNOSIS — Z96642 Presence of left artificial hip joint: Secondary | ICD-10-CM | POA: Diagnosis not present

## 2021-08-03 DIAGNOSIS — R7303 Prediabetes: Secondary | ICD-10-CM | POA: Insufficient documentation

## 2021-08-03 DIAGNOSIS — Z87891 Personal history of nicotine dependence: Secondary | ICD-10-CM | POA: Diagnosis not present

## 2021-08-03 DIAGNOSIS — Z20822 Contact with and (suspected) exposure to covid-19: Secondary | ICD-10-CM | POA: Insufficient documentation

## 2021-08-03 DIAGNOSIS — K219 Gastro-esophageal reflux disease without esophagitis: Secondary | ICD-10-CM | POA: Diagnosis not present

## 2021-08-03 DIAGNOSIS — E039 Hypothyroidism, unspecified: Secondary | ICD-10-CM | POA: Insufficient documentation

## 2021-08-03 HISTORY — PX: TOTAL HIP ARTHROPLASTY: SHX124

## 2021-08-03 LAB — SARS CORONAVIRUS 2 BY RT PCR (HOSPITAL ORDER, PERFORMED IN ~~LOC~~ HOSPITAL LAB): SARS Coronavirus 2: NEGATIVE

## 2021-08-03 SURGERY — ARTHROPLASTY, HIP, TOTAL, ANTERIOR APPROACH
Anesthesia: Spinal | Site: Hip | Laterality: Left

## 2021-08-03 MED ORDER — PRAVASTATIN SODIUM 20 MG PO TABS
ORAL_TABLET | ORAL | Status: AC
  Filled 2021-08-03: qty 2

## 2021-08-03 MED ORDER — HYDROMORPHONE HCL 1 MG/ML IJ SOLN
INTRAMUSCULAR | Status: AC
  Filled 2021-08-03: qty 2

## 2021-08-03 MED ORDER — POVIDONE-IODINE 10 % EX SWAB
2.0000 "application " | Freq: Once | CUTANEOUS | Status: AC
  Administered 2021-08-03: 2 via TOPICAL

## 2021-08-03 MED ORDER — ORAL CARE MOUTH RINSE: 15.0000 mL | Freq: Once | OROMUCOSAL | Status: AC

## 2021-08-03 MED ORDER — LAMOTRIGINE 100 MG PO TABS
ORAL_TABLET | ORAL | Status: AC
  Filled 2021-08-03: qty 2

## 2021-08-03 MED ORDER — FLUTICASONE PROPIONATE 50 MCG/ACT NA SUSP
2.0000 | Freq: Every day | NASAL | Status: DC
  Filled 2021-08-03: qty 16

## 2021-08-03 MED ORDER — POLYETHYLENE GLYCOL 3350 17 G PO PACK
17.0000 g | PACK | Freq: Every day | ORAL | Status: DC | PRN
  Administered 2021-08-05: 17 g via ORAL
  Filled 2021-08-03: qty 1

## 2021-08-03 MED ORDER — METHOCARBAMOL 500 MG PO TABS
500.0000 mg | ORAL_TABLET | Freq: Four times a day (QID) | ORAL | Status: DC | PRN
  Administered 2021-08-04 – 2021-08-05 (×3): 500 mg via ORAL
  Filled 2021-08-03 (×3): qty 1

## 2021-08-03 MED ORDER — METOCLOPRAMIDE HCL 5 MG PO TABS: 5.0000 mg | ORAL_TABLET | Freq: Three times a day (TID) | ORAL | Status: DC | PRN

## 2021-08-03 MED ORDER — ONDANSETRON HCL 4 MG/2ML IJ SOLN: 4.0000 mg | Freq: Four times a day (QID) | INTRAMUSCULAR | Status: DC | PRN

## 2021-08-03 MED ORDER — CHLORHEXIDINE GLUCONATE CLOTH 2 % EX PADS: 6.0000 | MEDICATED_PAD | Freq: Every day | CUTANEOUS | Status: DC

## 2021-08-03 MED ORDER — KETOROLAC TROMETHAMINE 15 MG/ML IJ SOLN
15.0000 mg | Freq: Once | INTRAMUSCULAR | Status: AC
  Administered 2021-08-03: 15 mg via INTRAVENOUS

## 2021-08-03 MED ORDER — CELECOXIB 200 MG PO CAPS
200.0000 mg | ORAL_CAPSULE | Freq: Two times a day (BID) | ORAL | Status: DC
  Administered 2021-08-03 – 2021-08-05 (×4): 200 mg via ORAL
  Filled 2021-08-03 (×3): qty 1

## 2021-08-03 MED ORDER — ONDANSETRON HCL 4 MG/2ML IJ SOLN
INTRAMUSCULAR | Status: AC
  Filled 2021-08-03: qty 2

## 2021-08-03 MED ORDER — METOCLOPRAMIDE HCL 5 MG/ML IJ SOLN: 5.0000 mg | Freq: Three times a day (TID) | INTRAMUSCULAR | Status: DC | PRN

## 2021-08-03 MED ORDER — PANTOPRAZOLE SODIUM 40 MG PO TBEC
40.0000 mg | DELAYED_RELEASE_TABLET | Freq: Every day | ORAL | Status: DC
  Administered 2021-08-04 – 2021-08-05 (×2): 40 mg via ORAL
  Filled 2021-08-03 (×2): qty 1

## 2021-08-03 MED ORDER — VANCOMYCIN HCL IN DEXTROSE 1-5 GM/200ML-% IV SOLN
1000.0000 mg | INTRAVENOUS | Status: AC
  Administered 2021-08-03: 1000 mg via INTRAVENOUS

## 2021-08-03 MED ORDER — DEXAMETHASONE SODIUM PHOSPHATE 10 MG/ML IJ SOLN
INTRAMUSCULAR | Status: AC
  Filled 2021-08-03: qty 1

## 2021-08-03 MED ORDER — AMLODIPINE BESYLATE 5 MG PO TABS
ORAL_TABLET | ORAL | Status: AC
  Filled 2021-08-03: qty 1

## 2021-08-03 MED ORDER — ALPRAZOLAM 1 MG PO TABS
1.0000 mg | ORAL_TABLET | Freq: Every day | ORAL | Status: DC
  Administered 2021-08-04: 1 mg via ORAL
  Filled 2021-08-03: qty 1

## 2021-08-03 MED ORDER — SODIUM CHLORIDE 0.9 % IR SOLN
Status: DC | PRN
  Administered 2021-08-03: 1000 mL

## 2021-08-03 MED ORDER — STERILE WATER FOR IRRIGATION IR SOLN
Status: DC | PRN
  Administered 2021-08-03: 2000 mL

## 2021-08-03 MED ORDER — HYDROMORPHONE HCL 1 MG/ML IJ SOLN
0.2500 mg | INTRAMUSCULAR | Status: DC | PRN
  Administered 2021-08-03 (×4): 0.5 mg via INTRAVENOUS

## 2021-08-03 MED ORDER — ONDANSETRON HCL 4 MG/2ML IJ SOLN: 4.0000 mg | Freq: Once | INTRAMUSCULAR | Status: DC | PRN

## 2021-08-03 MED ORDER — CEFAZOLIN SODIUM-DEXTROSE 2-4 GM/100ML-% IV SOLN
INTRAVENOUS | Status: AC
  Filled 2021-08-03: qty 100

## 2021-08-03 MED ORDER — TRAZODONE HCL 100 MG PO TABS
ORAL_TABLET | ORAL | Status: AC
  Filled 2021-08-03: qty 4

## 2021-08-03 MED ORDER — TRANEXAMIC ACID-NACL 1000-0.7 MG/100ML-% IV SOLN
INTRAVENOUS | Status: AC
  Filled 2021-08-03: qty 100

## 2021-08-03 MED ORDER — FENTANYL CITRATE (PF) 100 MCG/2ML IJ SOLN
INTRAMUSCULAR | Status: AC
  Filled 2021-08-03: qty 2

## 2021-08-03 MED ORDER — LAMOTRIGINE 100 MG PO TABS
200.0000 mg | ORAL_TABLET | Freq: Two times a day (BID) | ORAL | Status: DC
  Administered 2021-08-03 – 2021-08-05 (×4): 200 mg via ORAL
  Filled 2021-08-03 (×3): qty 2

## 2021-08-03 MED ORDER — PHENOL 1.4 % MT LIQD: 1.0000 | OROMUCOSAL | Status: DC | PRN

## 2021-08-03 MED ORDER — LEVOTHYROXINE SODIUM 100 MCG PO TABS
100.0000 ug | ORAL_TABLET | Freq: Every day | ORAL | Status: DC
  Administered 2021-08-04 – 2021-08-05 (×2): 100 ug via ORAL
  Filled 2021-08-03 (×3): qty 1

## 2021-08-03 MED ORDER — MIDAZOLAM HCL 2 MG/2ML IJ SOLN
INTRAMUSCULAR | Status: AC
  Filled 2021-08-03: qty 2

## 2021-08-03 MED ORDER — TRAZODONE HCL 50 MG PO TABS
450.0000 mg | ORAL_TABLET | Freq: Every day | ORAL | Status: DC
  Administered 2021-08-03: 300 mg via ORAL
  Filled 2021-08-03: qty 1

## 2021-08-03 MED ORDER — KETOROLAC TROMETHAMINE 15 MG/ML IJ SOLN
7.5000 mg | Freq: Four times a day (QID) | INTRAMUSCULAR | Status: AC
  Administered 2021-08-04 (×4): 7.5 mg via INTRAVENOUS
  Filled 2021-08-03 (×3): qty 1

## 2021-08-03 MED ORDER — ASPIRIN 81 MG PO CHEW
81.0000 mg | CHEWABLE_TABLET | Freq: Two times a day (BID) | ORAL | Status: DC
  Administered 2021-08-03 – 2021-08-05 (×4): 81 mg via ORAL
  Filled 2021-08-03 (×3): qty 1

## 2021-08-03 MED ORDER — BISACODYL 10 MG RE SUPP: 10.0000 mg | Freq: Every day | RECTAL | Status: DC | PRN

## 2021-08-03 MED ORDER — METHOCARBAMOL 500 MG PO TABS
ORAL_TABLET | ORAL | Status: AC
  Filled 2021-08-03: qty 1

## 2021-08-03 MED ORDER — CHLORHEXIDINE GLUCONATE 0.12 % MT SOLN
15.0000 mL | Freq: Once | OROMUCOSAL | Status: AC
  Administered 2021-08-03: 15 mL via OROMUCOSAL

## 2021-08-03 MED ORDER — HYDROMORPHONE HCL 1 MG/ML IJ SOLN
INTRAMUSCULAR | Status: AC
  Filled 2021-08-03: qty 1

## 2021-08-03 MED ORDER — BUPIVACAINE IN DEXTROSE 0.75-8.25 % IT SOLN
INTRATHECAL | Status: DC | PRN
  Administered 2021-08-03: 1.6 mL via INTRATHECAL

## 2021-08-03 MED ORDER — DEXAMETHASONE SODIUM PHOSPHATE 10 MG/ML IJ SOLN
8.0000 mg | Freq: Once | INTRAMUSCULAR | Status: AC
  Administered 2021-08-03: 8 mg via INTRAVENOUS

## 2021-08-03 MED ORDER — PRAVASTATIN SODIUM 20 MG PO TABS
40.0000 mg | ORAL_TABLET | Freq: Every day | ORAL | Status: DC
  Administered 2021-08-03 – 2021-08-05 (×3): 40 mg via ORAL
  Filled 2021-08-03 (×2): qty 2

## 2021-08-03 MED ORDER — CELECOXIB 200 MG PO CAPS
ORAL_CAPSULE | ORAL | Status: AC
  Filled 2021-08-03: qty 1

## 2021-08-03 MED ORDER — ONDANSETRON HCL 4 MG/2ML IJ SOLN
INTRAMUSCULAR | Status: DC | PRN
  Administered 2021-08-03: 4 mg via INTRAVENOUS

## 2021-08-03 MED ORDER — LIDOCAINE HCL (PF) 2 % IJ SOLN
INTRAMUSCULAR | Status: AC
  Filled 2021-08-03: qty 5

## 2021-08-03 MED ORDER — ONDANSETRON HCL 4 MG PO TABS: 4.0000 mg | ORAL_TABLET | Freq: Four times a day (QID) | ORAL | Status: DC | PRN

## 2021-08-03 MED ORDER — CEFAZOLIN SODIUM-DEXTROSE 2-4 GM/100ML-% IV SOLN
2.0000 g | Freq: Four times a day (QID) | INTRAVENOUS | Status: AC
  Administered 2021-08-03 – 2021-08-04 (×2): 2 g via INTRAVENOUS
  Filled 2021-08-03: qty 100

## 2021-08-03 MED ORDER — ASPIRIN 81 MG PO CHEW
CHEWABLE_TABLET | ORAL | Status: AC
  Filled 2021-08-03: qty 1

## 2021-08-03 MED ORDER — ACETAMINOPHEN 500 MG PO TABS
1000.0000 mg | ORAL_TABLET | Freq: Once | ORAL | Status: AC
  Administered 2021-08-03: 1000 mg via ORAL

## 2021-08-03 MED ORDER — LACTATED RINGERS IV SOLN: INTRAVENOUS | Status: DC

## 2021-08-03 MED ORDER — HYDROXYZINE HCL 25 MG PO TABS
25.0000 mg | ORAL_TABLET | Freq: Three times a day (TID) | ORAL | Status: DC | PRN
  Administered 2021-08-04 – 2021-08-05 (×2): 25 mg via ORAL
  Filled 2021-08-03 (×2): qty 1

## 2021-08-03 MED ORDER — ACETAMINOPHEN 325 MG PO TABS
325.0000 mg | ORAL_TABLET | Freq: Four times a day (QID) | ORAL | Status: DC | PRN
  Administered 2021-08-05: 650 mg via ORAL
  Filled 2021-08-03: qty 2

## 2021-08-03 MED ORDER — METHOCARBAMOL 500 MG IVPB - SIMPLE MED
500.0000 mg | Freq: Four times a day (QID) | INTRAVENOUS | Status: DC | PRN
  Administered 2021-08-03: 500 mg via INTRAVENOUS
  Filled 2021-08-03: qty 50

## 2021-08-03 MED ORDER — DOCUSATE SODIUM 100 MG PO CAPS
100.0000 mg | ORAL_CAPSULE | Freq: Two times a day (BID) | ORAL | Status: DC
  Administered 2021-08-03 – 2021-08-05 (×4): 100 mg via ORAL
  Filled 2021-08-03 (×3): qty 1

## 2021-08-03 MED ORDER — LURASIDONE HCL 20 MG PO TABS
60.0000 mg | ORAL_TABLET | Freq: Every day | ORAL | Status: DC
  Administered 2021-08-03 – 2021-08-05 (×3): 60 mg via ORAL
  Filled 2021-08-03 (×3): qty 3

## 2021-08-03 MED ORDER — HYDROMORPHONE HCL 2 MG PO TABS
2.0000 mg | ORAL_TABLET | ORAL | Status: DC | PRN
Start: 2021-08-03 — End: 2021-08-05
  Administered 2021-08-03 – 2021-08-05 (×9): 4 mg via ORAL
  Filled 2021-08-03 (×8): qty 2

## 2021-08-03 MED ORDER — FENTANYL CITRATE PF 50 MCG/ML IJ SOSY
PREFILLED_SYRINGE | INTRAMUSCULAR | Status: AC
  Filled 2021-08-03: qty 3

## 2021-08-03 MED ORDER — PHENYLEPHRINE HCL-NACL 20-0.9 MG/250ML-% IV SOLN
INTRAVENOUS | Status: DC | PRN
  Administered 2021-08-03: 50 ug/min via INTRAVENOUS

## 2021-08-03 MED ORDER — DIPHENHYDRAMINE HCL 12.5 MG/5ML PO ELIX: 12.5000 mg | ORAL_SOLUTION | ORAL | Status: DC | PRN

## 2021-08-03 MED ORDER — METHOCARBAMOL 500 MG IVPB - SIMPLE MED
INTRAVENOUS | Status: AC
  Filled 2021-08-03: qty 50

## 2021-08-03 MED ORDER — MUPIROCIN 2 % EX OINT
1.0000 "application " | TOPICAL_OINTMENT | Freq: Two times a day (BID) | CUTANEOUS | Status: DC
  Administered 2021-08-04 (×3): 1 via NASAL
  Filled 2021-08-03: qty 22

## 2021-08-03 MED ORDER — TRAZODONE HCL 50 MG PO TABS
ORAL_TABLET | ORAL | Status: AC
  Filled 2021-08-03: qty 1

## 2021-08-03 MED ORDER — PROPOFOL 500 MG/50ML IV EMUL
INTRAVENOUS | Status: DC | PRN
Start: 2021-08-03 — End: 2021-08-03
  Administered 2021-08-03: 125 ug/kg/min via INTRAVENOUS

## 2021-08-03 MED ORDER — MENTHOL 3 MG MT LOZG: 1.0000 | LOZENGE | OROMUCOSAL | Status: DC | PRN

## 2021-08-03 MED ORDER — SODIUM CHLORIDE 0.9 % IV SOLN: INTRAVENOUS | Status: DC

## 2021-08-03 MED ORDER — DOCUSATE SODIUM 100 MG PO CAPS
ORAL_CAPSULE | ORAL | Status: AC
  Filled 2021-08-03: qty 1

## 2021-08-03 MED ORDER — AMLODIPINE BESYLATE 5 MG PO TABS
5.0000 mg | ORAL_TABLET | Freq: Every day | ORAL | Status: DC
  Administered 2021-08-03 – 2021-08-05 (×3): 5 mg via ORAL
  Filled 2021-08-03 (×2): qty 1

## 2021-08-03 MED ORDER — HYDROMORPHONE HCL 2 MG PO TABS
ORAL_TABLET | ORAL | Status: AC
  Filled 2021-08-03: qty 2

## 2021-08-03 MED ORDER — ACETAMINOPHEN 500 MG PO TABS
ORAL_TABLET | ORAL | Status: AC
  Filled 2021-08-03: qty 2

## 2021-08-03 MED ORDER — TRANEXAMIC ACID-NACL 1000-0.7 MG/100ML-% IV SOLN
1000.0000 mg | Freq: Once | INTRAVENOUS | Status: AC
  Administered 2021-08-03: 1000 mg via INTRAVENOUS

## 2021-08-03 MED ORDER — PROPOFOL 1000 MG/100ML IV EMUL
INTRAVENOUS | Status: AC
  Filled 2021-08-03: qty 100

## 2021-08-03 MED ORDER — KETOROLAC TROMETHAMINE 15 MG/ML IJ SOLN
INTRAMUSCULAR | Status: AC
  Filled 2021-08-03: qty 1

## 2021-08-03 MED ORDER — FENTANYL CITRATE (PF) 100 MCG/2ML IJ SOLN
INTRAMUSCULAR | Status: DC | PRN
  Administered 2021-08-03: 50 ug via INTRAVENOUS

## 2021-08-03 MED ORDER — CEFAZOLIN SODIUM-DEXTROSE 2-4 GM/100ML-% IV SOLN
2.0000 g | INTRAVENOUS | Status: AC
  Administered 2021-08-03: 2 g via INTRAVENOUS

## 2021-08-03 MED ORDER — VANCOMYCIN HCL IN DEXTROSE 1-5 GM/200ML-% IV SOLN
INTRAVENOUS | Status: AC
  Filled 2021-08-03: qty 200

## 2021-08-03 MED ORDER — MIDAZOLAM HCL 5 MG/5ML IJ SOLN
INTRAMUSCULAR | Status: DC | PRN
  Administered 2021-08-03 (×2): 1 mg via INTRAVENOUS

## 2021-08-03 MED ORDER — DEXTROAMPHETAMINE SULFATE 5 MG PO TABS: 10.0000 mg | ORAL_TABLET | Freq: Every morning | ORAL | Status: DC

## 2021-08-03 MED ORDER — VANCOMYCIN HCL IN DEXTROSE 1-5 GM/200ML-% IV SOLN: 1000.0000 mg | Freq: Once | INTRAVENOUS | Status: DC

## 2021-08-03 MED ORDER — FERROUS SULFATE 325 (65 FE) MG PO TABS
325.0000 mg | ORAL_TABLET | Freq: Three times a day (TID) | ORAL | Status: DC
  Administered 2021-08-04 – 2021-08-05 (×4): 325 mg via ORAL
  Filled 2021-08-03 (×4): qty 1

## 2021-08-03 MED ORDER — DEXAMETHASONE SODIUM PHOSPHATE 10 MG/ML IJ SOLN
10.0000 mg | Freq: Once | INTRAMUSCULAR | Status: AC
  Administered 2021-08-04: 10 mg via INTRAVENOUS
  Filled 2021-08-03: qty 1

## 2021-08-03 MED ORDER — HYDROMORPHONE HCL 1 MG/ML IJ SOLN: 0.5000 mg | INTRAMUSCULAR | Status: DC | PRN

## 2021-08-03 MED ORDER — PROPOFOL 10 MG/ML IV BOLUS
INTRAVENOUS | Status: DC | PRN
  Administered 2021-08-03: 20 mg via INTRAVENOUS
  Administered 2021-08-03: 30 mg via INTRAVENOUS
  Administered 2021-08-03: 20 mg via INTRAVENOUS

## 2021-08-03 MED ORDER — PROPOFOL 10 MG/ML IV BOLUS
INTRAVENOUS | Status: AC
  Filled 2021-08-03: qty 20

## 2021-08-03 MED ORDER — TRANEXAMIC ACID-NACL 1000-0.7 MG/100ML-% IV SOLN
1000.0000 mg | INTRAVENOUS | Status: AC
  Administered 2021-08-03: 1000 mg via INTRAVENOUS

## 2021-08-03 MED ORDER — MIDAZOLAM HCL 5 MG/5ML IJ SOLN
INTRAMUSCULAR | Status: DC | PRN
  Administered 2021-08-03: 2 mg via INTRAVENOUS

## 2021-08-03 MED ORDER — HYDROMORPHONE HCL 1 MG/ML IJ SOLN
1.0000 mg | Freq: Once | INTRAMUSCULAR | Status: AC
  Administered 2021-08-03: 1 mg via INTRAVENOUS

## 2021-08-03 MED ORDER — FENTANYL CITRATE PF 50 MCG/ML IJ SOSY
25.0000 ug | PREFILLED_SYRINGE | INTRAMUSCULAR | Status: DC | PRN
  Administered 2021-08-03 (×3): 50 ug via INTRAVENOUS

## 2021-08-03 SURGICAL SUPPLY — 43 items
ADH SKN CLS APL DERMABOND .7 (GAUZE/BANDAGES/DRESSINGS) ×1
BAG COUNTER SPONGE SURGICOUNT (BAG) ×1 IMPLANT
BAG DECANTER FOR FLEXI CONT (MISCELLANEOUS) IMPLANT
BAG SPEC THK2 15X12 ZIP CLS (MISCELLANEOUS)
BAG SPNG CNTER NS LX DISP (BAG) ×1
BAG SURGICOUNT SPONGE COUNTING (BAG) ×1
BAG ZIPLOCK 12X15 (MISCELLANEOUS) IMPLANT
BLADE SAG 18X100X1.27 (BLADE) ×3 IMPLANT
COVER PERINEAL POST (MISCELLANEOUS) ×3 IMPLANT
COVER SURGICAL LIGHT HANDLE (MISCELLANEOUS) ×3 IMPLANT
CUP ACETBLR 54 OD PINNACLE (Hips) ×2 IMPLANT
DERMABOND ADVANCED (GAUZE/BANDAGES/DRESSINGS) ×2
DERMABOND ADVANCED .7 DNX12 (GAUZE/BANDAGES/DRESSINGS) ×1 IMPLANT
DRAPE FOOT SWITCH (DRAPES) ×3 IMPLANT
DRAPE STERI IOBAN 125X83 (DRAPES) ×3 IMPLANT
DRAPE U-SHAPE 47X51 STRL (DRAPES) ×6 IMPLANT
DRESSING AQUACEL AG SP 3.5X10 (GAUZE/BANDAGES/DRESSINGS) ×1 IMPLANT
DRSG AQUACEL AG SP 3.5X10 (GAUZE/BANDAGES/DRESSINGS) ×3
DURAPREP 26ML APPLICATOR (WOUND CARE) ×3 IMPLANT
ELECT REM PT RETURN 15FT ADLT (MISCELLANEOUS) ×3 IMPLANT
ELIMINATOR HOLE APEX DEPUY (Hips) ×2 IMPLANT
GLOVE SURG ENC MOIS LTX SZ7 (GLOVE) ×3 IMPLANT
GLOVE SURG UNDER LTX SZ6.5 (GLOVE) ×3 IMPLANT
GLOVE SURG UNDER POLY LF SZ7.5 (GLOVE) ×3 IMPLANT
GOWN STRL REUS W/TWL LRG LVL3 (GOWN DISPOSABLE) ×6 IMPLANT
HEAD CERAMIC 36 PLUS5 (Hips) ×2 IMPLANT
HOLDER FOLEY CATH W/STRAP (MISCELLANEOUS) ×3 IMPLANT
KIT TURNOVER KIT A (KITS) IMPLANT
LINER NEUTRAL 54X36MM PLUS 4 (Hips) ×2 IMPLANT
PACK ANTERIOR HIP CUSTOM (KITS) ×3 IMPLANT
PENCIL SMOKE EVACUATOR (MISCELLANEOUS) IMPLANT
SCREW 6.5MMX25MM (Screw) ×2 IMPLANT
STEM FEM ACTIS HIGH SZ7 (Stem) ×2 IMPLANT
SUT MNCRL AB 4-0 PS2 18 (SUTURE) ×3 IMPLANT
SUT STRATAFIX 0 PDS 27 VIOLET (SUTURE) ×3
SUT VIC AB 1 CT1 36 (SUTURE) ×9 IMPLANT
SUT VIC AB 2-0 CT1 27 (SUTURE) ×6
SUT VIC AB 2-0 CT1 TAPERPNT 27 (SUTURE) ×2 IMPLANT
SUTURE STRATFX 0 PDS 27 VIOLET (SUTURE) ×1 IMPLANT
TRAY FOLEY MTR SLVR 16FR STAT (SET/KITS/TRAYS/PACK) IMPLANT
TRAY FOLEY W/BAG SLVR 14FR LF (SET/KITS/TRAYS/PACK) ×2 IMPLANT
TUBE SUCTION HIGH CAP CLEAR NV (SUCTIONS) ×3 IMPLANT
WATER STERILE IRR 1000ML POUR (IV SOLUTION) ×3 IMPLANT

## 2021-08-03 NOTE — Transfer of Care (Signed)
Immediate Anesthesia Transfer of Care Note  Patient: Becky Lawson  Procedure(s) Performed: TOTAL HIP ARTHROPLASTY ANTERIOR APPROACH (Left: Hip)  Patient Location: PACU  Anesthesia Type:Spinal  Level of Consciousness: awake, alert  and oriented  Airway & Oxygen Therapy: Patient Spontanous Breathing and Patient connected to face mask oxygen  Post-op Assessment: Report given to RN and Post -op Vital signs reviewed and stable  Post vital signs: Reviewed and stable  Last Vitals:  Vitals Value Taken Time  BP 128/78   Temp    Pulse 89 08/03/21 1558  Resp 19   SpO2 100 % 08/03/21 1558  Vitals shown include unvalidated device data.  Last Pain:  Vitals:   08/03/21 1214  TempSrc:   PainSc: 2       Patients Stated Pain Goal: 4 (45/84/83 5075)  Complications: No notable events documented.

## 2021-08-03 NOTE — Op Note (Signed)
NAME:  Becky Lawson NO.: [id number]      MEDICAL RECORD NO.: [id number]      FACILITY:  [facility]      PHYSICIAN:  [name]  DATE OF BIRTH:  1951/05/11     DATE OF PROCEDURE:  08/03/2021                                 OPERATIVE REPORT         PREOPERATIVE DIAGNOSIS: Left  hip osteoarthritis.      POSTOPERATIVE DIAGNOSIS:  Left hip osteoarthritis.      PROCEDURE:  Left total hip replacement through an anterior approach   utilizing DePuy THR system, component size 56 mm pinnacle cup, a size 36+4 neutral   Altrex liner, a size 7 Hi Actis stem with a 36+1.5 delta ceramic   ball.      SURGEON:  Pietro Cassis. Alvan Dame, M.D.      ASSISTANT:  Costella Hatcher, PA-C     ANESTHESIA:  Spinal.      SPECIMENS:  None.      COMPLICATIONS:  None.      BLOOD LOSS:  800 cc     DRAINS:  None.      INDICATION OF THE PROCEDURE:  Becky Lawson is a 70 y.o. female who had   presented to office for evaluation of left hip pain.  Radiographs revealed   progressive degenerative changes with bone-on-bone   articulation of the  hip joint, including subchondral cystic changes and osteophytes.  The patient had painful limited range of   motion significantly affecting their overall quality of life and function.  The patient was failing to    respond to conservative measures including medications and/or injections and activity modification and at this point was ready   to proceed with more definitive measures.  Consent was obtained for   benefit of pain relief.  Specific risks of infection, DVT, component   failure, dislocation, neurovascular injury, and need for revision surgery were reviewed in the office as well discussion of   the anterior versus posterior approach were reviewed.     PROCEDURE IN DETAIL:  The patient was brought to operative theater.   Once adequate anesthesia, preoperative antibiotics, 2 gm of Ancef, 1 gm of Tranexamic Acid, and 10 mg of  Decadron were administered, the patient was positioned supine on the [employer] table.  Once the patient was safely positioned with adequate padding of boney prominences we predraped out the hip, and used fluoroscopy to confirm orientation of the pelvis.      The left hip was then prepped and draped from proximal iliac crest to   mid thigh with a shower curtain technique.      Time-out was performed identifying the patient, planned procedure, and the appropriate extremity.     An incision was then made 2 cm lateral to the   anterior superior iliac spine extending over the orientation of the   tensor fascia lata muscle and sharp dissection was carried down to the   fascia of the muscle.      The fascia was then incised.  The muscle belly was identified and swept   laterally and retractor placed along the superior neck.  Following   cauterization of the circumflex vessels and removing some  pericapsular   fat, a second cobra retractor was placed on the inferior neck.  A T-capsulotomy was made along the line of the   superior neck to the trochanteric fossa, then extended proximally and   distally.  Tag sutures were placed and the retractors were then placed   intracapsular.  We then identified the trochanteric fossa and   orientation of my neck cut and then made a neck osteotomy with the femur on traction.  The femoral   head was removed without difficulty or complication.  Traction was let   off and retractors were placed posterior and anterior around the   acetabulum.      The Lawson and foveal tissue were debrided.  I began reaming with a 45 mm   reamer and reamed up to 55 mm reamer with good bony bed preparation and a 56 mm  cup was chosen.  The final 56 mm Pinnacle cup was then impacted under fluoroscopy to confirm the depth of penetration and orientation with respect to   Abduction and forward flexion.  A screw was placed into the ilium followed by the hole eliminator.  The final   36+4  neutral Altrex liner was impacted with good visualized rim fit.  The cup was positioned anatomically within the acetabular portion of the pelvis.      At this point, the femur was rolled to 100 degrees.  Further capsule was   released off the inferior aspect of the femoral neck.  I then   released the superior capsule proximally.  With the leg in a neutral position the hook was placed laterally   along the femur under the vastus lateralis origin and elevated manually and then held in position using the hook attachment on the bed.  The leg was then extended and adducted with the leg rolled to 100   degrees of external rotation.  Retractors were placed along the medial calcar and posteriorly over the greater trochanter.  Once the proximal femur was fully   exposed, I used a box osteotome to set orientation.  I then began   broaching with the starting chili pepper broach and passed this by hand and then broached up to 7.  With the 7 broach in place I chose a high offset neck and did several trial reductions.  The offset was appropriate, leg lengths   appeared to be equal best matched with the +1.5 head ball trial confirmed radiographically.   Given these findings, I went ahead and dislocated the hip, repositioned all   retractors and positioned the right hip in the extended and abducted position.  The final 7 Hi Actis stem was   chosen and it was impacted down to the level of neck cut.  Based on this   and the trial reductions, a final 36+1.5 delta ceramic ball was chosen and   impacted onto a clean and dry trunnion, and the hip was reduced.  The   hip had been irrigated throughout the case again at this point. The fascia of the   tensor fascia lata muscle was then reapproximated using #1 Vicryl and #0 Stratafix sutures.  The   remaining wound was closed with 2-0 Vicryl and running 4-0 Monocryl.   The hip was cleaned, dried, and dressed sterilely using Dermabond and   Aquacel dressing.  The patient  was then brought   to recovery room in stable condition tolerating the procedure well.    Costella Hatcher, PA-C was present for the entirety  of the case involved from   preoperative positioning, perioperative retractor management, general   facilitation of the case, as well as primary wound closure as assistant.            Pietro Cassis Alvan Dame, M.D.        08/03/2021 2:25 PM

## 2021-08-03 NOTE — Interval H&P Note (Signed)
History and Physical Interval Note:  08/03/2021 12:42 PM  Becky Lawson  has presented today for surgery, with the diagnosis of Left hip osteoarthritis.  The various methods of treatment have been discussed with the patient and family. After consideration of risks, benefits and other options for treatment, the patient has consented to  Procedure(s): TOTAL HIP ARTHROPLASTY ANTERIOR APPROACH (Left) as a surgical intervention.  The patient's history has been reviewed, patient examined, no change in status, stable for surgery.  I have reviewed the patient's chart and labs.  Questions were answered to the patient's satisfaction.     [name]

## 2021-08-03 NOTE — Care Plan (Signed)
Ortho Bundle Case Management Note  Patient Details  Name: Becky Lawson MRN: [id number] Date of Birth: 05/01/1951  L THA on 08-03-21 DCP:  Home with dtr.   DME:  Has a RW.  3-in-1 ordered through [facility] [facility] PT:  HEP                   DME Arranged:  3-N-1 DME Agency:  [employer]  HH Arranged:  NA [place] Agency:  NA  Additional Comments: Please contact me with any questions of if this plan should need to change.  Marianne Sofia, RN,CCM [facility]  [phone] 08/03/2021, 2:40 PM

## 2021-08-03 NOTE — Anesthesia Postprocedure Evaluation (Signed)
Anesthesia Post Note  Patient: Becky Lawson  Procedure(s) Performed: TOTAL HIP ARTHROPLASTY ANTERIOR APPROACH (Left: Hip)     Patient location during evaluation: PACU Anesthesia Type: Spinal Level of consciousness: oriented, awake and alert and awake Pain management: pain level controlled Vital Signs Assessment: post-procedure vital signs reviewed and stable Respiratory status: spontaneous breathing, respiratory function stable and nonlabored ventilation Cardiovascular status: blood pressure returned to baseline and stable Postop Assessment: no headache, no backache, no apparent nausea or vomiting and spinal receding Anesthetic complications: no   No notable events documented.  Last Vitals:  Vitals:   08/03/21 1141 08/03/21 1600  BP: (!) 186/96 128/78  Pulse: 92 78  Resp: 15 (!) 21  Temp: 37.2 C 36.6 C  SpO2: 96% 100%    Last Pain:  Vitals:   08/03/21 1650  TempSrc:   PainSc: [place]

## 2021-08-03 NOTE — Anesthesia Procedure Notes (Signed)
Spinal  Patient location during procedure: OR End time: 08/03/2021 2:19 PM Reason for block: surgical anesthesia Staffing Performed: resident/CRNA  Resident/CRNA: Brandun Pinn D, CRNA Preanesthetic Checklist Completed: patient identified, IV checked, site marked, risks and benefits discussed, surgical consent, monitors and equipment checked, pre-op evaluation and timeout performed Spinal Block Patient position: sitting Prep: ChloraPrep Patient monitoring: heart rate, continuous pulse ox and blood pressure Approach: midline Location: L3-4 Injection technique: single-shot Needle Needle type: Spinocan  Needle gauge: 24 G Needle length: 9 cm Assessment Sensory level: T6 Events: CSF return

## 2021-08-03 NOTE — Addendum Note (Signed)
Addendum  created 08/03/21 1703 by Catalina Gravel, MD   Order list changed, Pharmacy for encounter modified

## 2021-08-03 NOTE — Discharge Instructions (Signed)

## 2021-08-03 NOTE — Anesthesia Preprocedure Evaluation (Addendum)
Anesthesia Evaluation  Patient identified by MRN, date of birth, ID band Patient awake    Reviewed: Allergy & Precautions, NPO status , Patient's Chart, lab work & pertinent test results  Airway Mallampati: III  TM Distance: >3 FB Neck ROM: Full    Dental  (+) Teeth Intact, Dental Advisory Given   Pulmonary sleep apnea , former smoker,    Pulmonary exam normal breath sounds clear to auscultation       Cardiovascular hypertension, Pt. on medications (-) angina(-) Past MI Normal cardiovascular exam Rhythm:Regular Rate:Normal     Neuro/Psych PSYCHIATRIC DISORDERS Depression Bipolar Disorder Spinal stenosis     GI/Hepatic GERD  Medicated,(+) Hepatitis - (s/p treatment), C  Endo/Other  Hypothyroidism Obesity   Renal/GU Renal InsufficiencyRenal disease     Musculoskeletal  (+) Arthritis , Left hip osteoarthritis   Abdominal   Peds  Hematology negative hematology ROS (+) Plt 289k   Anesthesia Other Findings Day of surgery medications reviewed with the patient.  Reproductive/Obstetrics                            Anesthesia Physical Anesthesia Plan  ASA: 3  Anesthesia Plan: Spinal   Post-op Pain Management: Tylenol PO (pre-op)   Induction: Intravenous  PONV Risk Score and Plan: 2 and Dexamethasone and Ondansetron  Airway Management Planned: Nasal Cannula and Natural Airway  Additional Equipment:   Intra-op Plan:   Post-operative Plan:   Informed Consent: I have reviewed the patients History and Physical, chart, labs and discussed the procedure including the risks, benefits and alternatives for the proposed anesthesia with the patient or authorized representative who has indicated his/her understanding and acceptance.     Dental advisory given  Plan Discussed with: CRNA, Anesthesiologist and Surgeon  Anesthesia Plan Comments:         Anesthesia Quick Evaluation

## 2021-08-04 ENCOUNTER — Encounter (HOSPITAL_COMMUNITY): Payer: Self-pay | Admitting: Orthopedic Surgery

## 2021-08-04 DIAGNOSIS — M1612 Unilateral primary osteoarthritis, left hip: Secondary | ICD-10-CM | POA: Diagnosis not present

## 2021-08-04 LAB — CBC
HCT: 31.7 % — ABNORMAL LOW (ref 36.0–46.0)
Hemoglobin: 10.5 g/dL — ABNORMAL LOW (ref 12.0–15.0)
MCH: 31.3 pg (ref 26.0–34.0)
MCHC: 33.1 g/dL (ref 30.0–36.0)
MCV: 94.3 fL (ref 80.0–100.0)
Platelets: 230 10*3/uL (ref 150–400)
RBC: 3.36 MIL/uL — ABNORMAL LOW (ref 3.87–5.11)
RDW: 12.3 % (ref 11.5–15.5)
WBC: 13 10*3/uL — ABNORMAL HIGH (ref 4.0–10.5)
nRBC: 0 % (ref 0.0–0.2)

## 2021-08-04 LAB — BASIC METABOLIC PANEL
Anion gap: 6 (ref 5–15)
BUN: 14 mg/dL (ref 8–23)
CO2: 26 mmol/L (ref 22–32)
Calcium: 8.3 mg/dL — ABNORMAL LOW (ref 8.9–10.3)
Chloride: 104 mmol/L (ref 98–111)
Creatinine, Ser: 0.93 mg/dL (ref 0.44–1.00)
GFR, Estimated: >60 mL/min (ref >60)
Glucose, Bld: 201 mg/dL — ABNORMAL HIGH (ref 70–99)
Potassium: 4 mmol/L (ref 3.5–5.1)
Sodium: 136 mmol/L (ref 135–145)

## 2021-08-04 MED ORDER — CELECOXIB 200 MG PO CAPS: 200.0000 mg | ORAL_CAPSULE | Freq: Two times a day (BID) | ORAL | 0 refills | Status: DC

## 2021-08-04 MED ORDER — POLYETHYLENE GLYCOL 3350 17 G PO PACK: 17.0000 g | PACK | Freq: Every day | ORAL | 0 refills | Status: AC | PRN

## 2021-08-04 MED ORDER — ASPIRIN 81 MG PO CHEW: 81.0000 mg | CHEWABLE_TABLET | Freq: Two times a day (BID) | ORAL | 0 refills | Status: AC

## 2021-08-04 MED ORDER — METHOCARBAMOL 500 MG PO TABS
500.0000 mg | ORAL_TABLET | Freq: Four times a day (QID) | ORAL | 0 refills | Status: DC | PRN
Start: 2021-08-04 — End: 2022-01-24

## 2021-08-04 MED ORDER — ACETAMINOPHEN 325 MG PO TABS: 1000.0000 mg | ORAL_TABLET | Freq: Four times a day (QID) | ORAL | Status: DC | PRN

## 2021-08-04 MED ORDER — DOCUSATE SODIUM 100 MG PO CAPS: 100.0000 mg | ORAL_CAPSULE | Freq: Two times a day (BID) | ORAL | 0 refills | Status: AC

## 2021-08-04 NOTE — Evaluation (Signed)
Physical Therapy Evaluation Patient Details Name: Becky Lawson MRN: [id number] DOB: 1950-09-07 Today's Date: 08/04/2021  History of Present Illness  Pt is a 70 year old female s/p Left THA direct anterior approach with hx of chronic pain management.  Clinical Impression  Pt is s/p THA resulting in the deficits listed below (see PT Problem List).  Pt will benefit from skilled PT to increase their independence and safety with mobility to allow discharge to the venue listed below.  Pt reports she believes pain will be an issue before mobilizing OOB.  Pt was agreeable to attempt and assisted to recliner however felt unable to tolerate ambulation.  Discussed pt's mobility and request for pain meds with RN.  Pt will need to be ambulatory and perform steps prior to d/c.      Recommendations for follow up therapy are one component of a multi-disciplinary discharge planning process, led by the attending physician.  Recommendations may be updated based on patient status, additional functional criteria and insurance authorization.  Follow Up Recommendations Follow physician's recommendations for discharge plan and follow up therapies    Assistance Recommended at Discharge Set up Supervision/Assistance  Functional Status Assessment Patient has had a recent decline in their functional status and demonstrates the ability to make significant improvements in function in a reasonable and predictable amount of time.  Equipment Recommendations  None recommended by PT    Recommendations for Other Services       Precautions / Restrictions Precautions Precautions: Fall Restrictions Weight Bearing Restrictions: No Other Position/Activity Restrictions: WBAT      Mobility  Bed Mobility Overal bed mobility: Needs Assistance Bed Mobility: Supine to Sit     Supine to sit: Min assist;HOB elevated     General bed mobility comments: cues for technique; assist for Lt LE support/assist     Transfers Overall transfer level: Needs assistance Equipment used: Rolling walker (2 wheels) Transfers: Sit to/from Stand;Bed to chair/wheelchair/BSC Sit to Stand: Min assist Stand pivot transfers: Min guard         General transfer comment: verbal cues for UE and LE positioning, light assist to rise and steady, pt reports pain increased and she is unable to ambulate so transferred to recliner (RN notified of pain and pt's request for a certain amount of pain medication - hx chronic use per pt)    Ambulation/Gait                  Stairs            Wheelchair Mobility    Modified Rankin (Stroke Patients Only)       Balance                                             Pertinent Vitals/Pain Pain Assessment: 0-10 Pain Score: 10-Worst pain ever Pain Location: left hip Pain Descriptors / Indicators: Sore;Guarding;Aching Pain Intervention(s): Repositioned;Premedicated before session;Monitored during session    Home Living Family/patient expects to be discharged to:: Private residence Living Arrangements: Alone   Type of Home: House Home Access: Stairs to enter Entrance Stairs-Rails: Can reach both Entrance Stairs-Number of Steps: 3-4   Home Layout: One level Home Equipment: Conservation officer, nature (2 wheels)      Prior Function Prior Level of Function : Independent/Modified Independent  Hand Dominance        Extremity/Trunk Assessment        Lower Extremity Assessment Lower Extremity Assessment: LLE deficits/detail LLE Deficits / Details: requiring assist due to pain, able to perform ankle pumps LLE: Unable to fully assess due to pain       Communication   Communication: No difficulties  Cognition Arousal/Alertness: Awake/alert Behavior During Therapy: WFL for tasks assessed/performed Overall Cognitive Status: Within Functional Limits for tasks assessed                                           General Comments      Exercises     Assessment/Plan    PT Assessment Patient needs continued PT services  PT Problem List Decreased strength;Decreased mobility;Decreased activity tolerance;Decreased knowledge of use of DME;Pain;Decreased balance       PT Treatment Interventions Stair training;Gait training;DME instruction;Therapeutic exercise;Balance training;Therapeutic activities;Functional mobility training;Patient/family education    PT Goals (Current goals can be found in the Care Plan section)  Acute Rehab PT Goals PT Goal Formulation: With patient Time For Goal Achievement: 08/11/21 Potential to Achieve Goals: Good    Frequency 7X/week   Barriers to discharge        Co-evaluation               AM-PAC PT "6 Clicks" Mobility  Outcome Measure Help needed turning from your back to your side while in a flat bed without using bedrails?: A Little Help needed moving from lying on your back to sitting on the side of a flat bed without using bedrails?: A Little Help needed moving to and from a bed to a chair (including a wheelchair)?: A Little Help needed standing up from a chair using your arms (e.g., wheelchair or bedside chair)?: A Little Help needed to walk in hospital room?: A Lot Help needed climbing 3-5 steps with a railing? : A Lot 6 Click Score: 16    End of Session Equipment Utilized During Treatment: Gait belt Activity Tolerance: Patient limited by pain Patient left: in chair;with chair alarm set;with call bell/phone within reach   PT Visit Diagnosis: Difficulty in walking, not elsewhere classified (R26.2);Other abnormalities of gait and mobility (R26.89)    Time: 1010-1022 PT Time Calculation (min) (ACUTE ONLY): 12 min   Charges:   PT Evaluation $PT Eval Low Complexity: 1 Low        Kati PT, DPT Acute Rehabilitation Services Pager: [phone] Office: [place] 08/04/2021, 12:53 PM

## 2021-08-04 NOTE — TOC Transition Note (Addendum)
Transition of Care [facility]) - CM/SW Discharge Note   Patient Details  Name: Becky Lawson MRN: [id number] Date of Birth: 29-Oct-1950  [facility]) CM/SW Contact:  Lennart Pall, LCSW Phone Number: 08/05/2021, 11:11 AM   Clinical Narrative:    Met with pt and confirming she plans to privately purchase needed 3n1 commode.  Plan for HEP. No TOC needs.  Addendum: pt now requesting TOC assist with order for 3n1 - order placed with [employer].   Final next level of care: Home/Self Care Barriers to Discharge: No Barriers Identified   Patient Goals and CMS Choice Patient states their goals for this hospitalization and ongoing recovery are:: return home      Discharge Placement                       Discharge Plan and Services                DME Arranged: 3-N-1 - DME Agency: [employer] Date DME Agency Contacted: 08/05/21 Time DME Agency Contacted: 1110 Representative spoke with at DME Agency: [place]: NA [place] Agency: NA        Social Determinants of Health ([place]) Interventions     Readmission Risk Interventions No flowsheet data found.

## 2021-08-04 NOTE — Progress Notes (Signed)
Physical Therapy Treatment Patient Details Name: Becky Lawson MRN: [id number] DOB: July 25, 1951 Today's Date: 08/04/2021   History of Present Illness Pt is a 70 year old female s/p Left THA direct anterior approach with hx of chronic pain management.    PT Comments    Pt assisted with ambulating however only tolerated short distance with pt reporting pain and fatigue.  Pt felt unable to perform steps or exercises this afternoon and requested assist back to bed.  Pt does report pain is better than this morning.  However, Pt does not yet appear ready for d/c home alone.    Recommendations for follow up therapy are one component of a multi-disciplinary discharge planning process, led by the attending physician.  Recommendations may be updated based on patient status, additional functional criteria and insurance authorization.  Follow Up Recommendations  Follow physician's recommendations for discharge plan and follow up therapies     Assistance Recommended at Discharge Set up Supervision/Assistance  Equipment Recommendations  None recommended by PT    Recommendations for Other Services       Precautions / Restrictions Precautions Precautions: Fall Restrictions Weight Bearing Restrictions: No Other Position/Activity Restrictions: WBAT     Mobility  Bed Mobility Overal bed mobility: Needs Assistance Bed Mobility: Sit to Supine     Supine to sit: Min assist;HOB elevated Sit to supine: Min assist   General bed mobility comments: cues for technique; assist for Lt LE support/assist    Transfers Overall transfer level: Needs assistance Equipment used: Rolling walker (2 wheels) Transfers: Sit to/from Stand Sit to Stand: Min assist Stand pivot transfers: Min guard         General transfer comment: verbal cues for UE and LE positioning, assist to rise and steady    Ambulation/Gait Ambulation/Gait assistance: Min assist Gait Distance (Feet): 25 Feet Assistive device:  Rolling walker (2 wheels) Gait Pattern/deviations: Step-to pattern;Decreased stance time - left;Antalgic       General Gait Details: verbal cues for sequence, RW positioning, posture; distance limited by fatigue and pain; also requiring min assist for one instance of LOB   Stairs             Wheelchair Mobility    Modified Rankin (Stroke Patients Only)       Balance                                            Cognition Arousal/Alertness: Awake/alert Behavior During Therapy: WFL for tasks assessed/performed Overall Cognitive Status: Within Functional Limits for tasks assessed                                          Exercises      General Comments        Pertinent Vitals/Pain Pain Assessment: 0-10 Pain Score: 7  Pain Location: left hip Pain Descriptors / Indicators: Sore;Guarding;Aching Pain Intervention(s): Repositioned;Monitored during session;Premedicated before session    Home Living Family/patient expects to be discharged to:: Private residence Living Arrangements: Alone   Type of Home: House Home Access: Stairs to enter Entrance Stairs-Rails: Can reach both Entrance Stairs-Number of Steps: 3-4   Home Layout: One level Home Equipment: Conservation officer, nature (2 wheels)      Prior Function  PT Goals (current goals can now be found in the care plan section) Acute Rehab PT Goals PT Goal Formulation: With patient Time For Goal Achievement: 08/11/21 Potential to Achieve Goals: Good Progress towards PT goals: Progressing toward goals    Frequency    7X/week      PT Plan Current plan remains appropriate    Co-evaluation              AM-PAC PT "6 Clicks" Mobility   Outcome Measure  Help needed turning from your back to your side while in a flat bed without using bedrails?: A Little Help needed moving from lying on your back to sitting on the side of a flat bed without using bedrails?: A  Little Help needed moving to and from a bed to a chair (including a wheelchair)?: A Little Help needed standing up from a chair using your arms (e.g., wheelchair or bedside chair)?: A Little Help needed to walk in hospital room?: A Little Help needed climbing 3-5 steps with a railing? : A Lot 6 Click Score: 17    End of Session Equipment Utilized During Treatment: Gait belt Activity Tolerance: Patient limited by pain;Patient limited by fatigue Patient left: in bed;with call bell/phone within reach;with bed alarm set Nurse Communication: Mobility status PT Visit Diagnosis: Difficulty in walking, not elsewhere classified (R26.2);Other abnormalities of gait and mobility (R26.89)     Time: 3748-2707 PT Time Calculation (min) (ACUTE ONLY): 10 min  Charges:  $Gait Training: 8-22 mins                    Arlyce Dice, DPT Acute Rehabilitation Services Pager: [phone] Office: [place] 08/04/2021, 3:29 PM

## 2021-08-04 NOTE — Plan of Care (Signed)
  Problem: Education: Goal: Knowledge of the prescribed therapeutic regimen will improve Outcome: Progressing   Problem: Activity: Goal: Ability to avoid complications of mobility impairment will improve Outcome: Progressing   Problem: Pain Management: Goal: Pain level will decrease with appropriate interventions Outcome: Progressing   

## 2021-08-04 NOTE — Progress Notes (Signed)
Subjective: 1 Day Post-Op Procedure(s) (LRB): TOTAL HIP ARTHROPLASTY ANTERIOR APPROACH (Left) Patient reports pain as moderate.   Patient seen in rounds by Dr. Alvan Dame. Patient is resting in bed on exam today. Foley catheter removed. She has not been up with PT yet.  We will start therapy today.   Objective: Vital signs in last 24 hours: Temp:  [97.9 F (36.6 C)-99 F (37.2 C)] 99 F (37.2 C) (11/30 0429) Pulse Rate:  [69-94] 81 (11/30 0429) Resp:  [10-21] 16 (11/30 0429) BP: (128-186)/(61-96) 133/61 (11/30 0429) SpO2:  [93 %-100 %] 95 % (11/30 0429) Weight:  [87.5 kg] 87.5 kg (11/29 1214)  Intake/Output from previous day:  Intake/Output Summary (Last 24 hours) at 08/04/2021 0723 Last data filed at 08/04/2021 0600 Gross per 24 hour  Intake 4134.64 ml  Output 2200 ml  Net 1934.64 ml     Intake/Output this shift: No intake/output data recorded.  Labs: Recent Labs    08/04/21 0319  HGB 10.5*   Recent Labs    08/04/21 0319  WBC 13.0*  RBC 3.36*  HCT 31.7*  PLT 230   Recent Labs    08/04/21 0319  NA 136  K 4.0  CL 104  CO2 26  BUN 14  CREATININE 0.93  GLUCOSE 201*  CALCIUM 8.3*   No results for input(s): LABPT, INR in the last 72 hours.  Exam: General - Patient is Alert and Oriented Extremity - Neurologically intact Sensation intact distally Intact pulses distally Dorsiflexion/Plantar flexion intact Dressing - dressing C/D/I Motor Function - intact, moving foot and toes well on exam.   Past Medical History:  Diagnosis Date   Arthritis    Benign cyst of right kidney 2014   2.7 cm right kidney cyst seen on imaging 2014 but was c/o right flank pain with new persistent proteinuria (fortunately hematuria had resolved) so repeat US 11/2016 showed right kidney still with 2.6 cm simple cyst and otherwise nml.   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, genotype 1b, [facility]   Hyperlipidemia    Hypertension    Hypothyroidism    Jaundice 11/01/2012   Pruritic disorder 02/13/2013   Sleep apnea    Was diagnosed approximately 20 years ago, but does not wear CPAP patient stated "I gave it back.Marland KitchenMarland KitchenI couldn't wear that thing it was horrible"   Spinal stenosis    Thyroid disease    [employer] 06/15/2020    Assessment/Plan: 1 Day Post-Op Procedure(s) (LRB): TOTAL HIP ARTHROPLASTY ANTERIOR APPROACH (Left) Principal Problem:   S/P total left hip arthroplasty  Estimated body mass index is 32.62 kg/m as calculated from the following:   Height as of this encounter: 5' 4.5" (1.638 m).   Weight as of this encounter: 87.5 kg. Advance diet Up with therapy D/C IV fluids  DVT Prophylaxis - Aspirin Weight bearing as tolerated.  Plan is to go Home after hospital stay. Plan to get up with PT today. Discharge home after 1-2 sessions as long as she is meeting her goals. Follow up in the office in 2 weeks.   She is in chronic pain management, and her pain provider is managing all post-op pain meds, which were sent prior to  surgery.   Griffith Citron, PA-C Orthopedic Surgery [phone] 08/04/2021, 7:23 AM

## 2021-08-05 DIAGNOSIS — M1612 Unilateral primary osteoarthritis, left hip: Secondary | ICD-10-CM | POA: Diagnosis not present

## 2021-08-05 DIAGNOSIS — R531 Weakness: Secondary | ICD-10-CM | POA: Diagnosis not present

## 2021-08-05 LAB — CBC
HCT: 30 % — ABNORMAL LOW (ref 36.0–46.0)
Hemoglobin: 10.1 g/dL — ABNORMAL LOW (ref 12.0–15.0)
MCH: 31.2 pg (ref 26.0–34.0)
MCHC: 33.7 g/dL (ref 30.0–36.0)
MCV: 92.6 fL (ref 80.0–100.0)
Platelets: 223 10*3/uL (ref 150–400)
RBC: 3.24 MIL/uL — ABNORMAL LOW (ref 3.87–5.11)
RDW: 12.6 % (ref 11.5–15.5)
WBC: 13.4 10*3/uL — ABNORMAL HIGH (ref 4.0–10.5)
nRBC: 0 % (ref 0.0–0.2)

## 2021-08-05 NOTE — Progress Notes (Signed)
Physical Therapy Treatment Patient Details Name: Becky Lawson MRN: [id number] DOB: August 11, 1951 Today's Date: 08/05/2021   History of Present Illness Pt is a 70 year old female s/p Left THA direct anterior approach with hx of chronic pain management.    PT Comments    Pt assisted with ambulating in hallway and performed LE exercises.  Pt reports pain much improved today.  Anticipate d/c home later today after practicing stairs.    Recommendations for follow up therapy are one component of a multi-disciplinary discharge planning process, led by the attending physician.  Recommendations may be updated based on patient status, additional functional criteria and insurance authorization.  Follow Up Recommendations  Follow physician's recommendations for discharge plan and follow up therapies     Assistance Recommended at Discharge Intermittent Supervision/Assistance  Equipment Recommendations  BSC/3in1 (pt requesting BSC, RN notified)    Recommendations for Other Services       Precautions / Restrictions Precautions Precautions: Fall Restrictions Other Position/Activity Restrictions: WBAT     Mobility  Bed Mobility               General bed mobility comments: pt in recliner    Transfers Overall transfer level: Needs assistance Equipment used: Rolling walker (2 wheels) Transfers: Sit to/from Stand Sit to Stand: Min guard           General transfer comment: verbal cues for UE and LE positioning    Ambulation/Gait Ambulation/Gait assistance: Min guard Gait Distance (Feet): 160 Feet Assistive device: Rolling walker (2 wheels) Gait Pattern/deviations: Step-to pattern;Decreased stance time - left;Antalgic       General Gait Details: verbal cues for sequence, RW positioning, posture; pt reports pain much improved today   Stairs             Wheelchair Mobility    Modified Rankin (Stroke Patients Only)       Balance                                             Cognition Arousal/Alertness: Awake/alert Behavior During Therapy: WFL for tasks assessed/performed Overall Cognitive Status: Within Functional Limits for tasks assessed                                          Exercises Total Joint Exercises Ankle Circles/Pumps: AROM;Both;10 reps Quad Sets: AROM;Both;10 reps Heel Slides: AAROM;Left;10 reps Hip ABduction/ADduction: AAROM;Left;10 reps;Standing;Supine Long Arc Quad: AROM;Left;10 reps;Seated Knee Flexion: AROM;Right;10 reps;Standing Marching in Standing: AROM;Left;10 reps;Standing    General Comments        Pertinent Vitals/Pain Pain Assessment: 0-10 Pain Score: 6  Pain Location: left hip Pain Descriptors / Indicators: Sore;Guarding;Aching Pain Intervention(s): Monitored during session;Repositioned;Ice applied;Premedicated before session    Home Living                          Prior Function            PT Goals (current goals can now be found in the care plan section) Progress towards PT goals: Progressing toward goals    Frequency    7X/week      PT Plan Current plan remains appropriate    Co-evaluation  AM-PAC PT "6 Clicks" Mobility   Outcome Measure  Help needed turning from your back to your side while in a flat bed without using bedrails?: A Little Help needed moving from lying on your back to sitting on the side of a flat bed without using bedrails?: A Little Help needed moving to and from a bed to a chair (including a wheelchair)?: A Little Help needed standing up from a chair using your arms (e.g., wheelchair or bedside chair)?: A Little Help needed to walk in hospital room?: A Little Help needed climbing 3-5 steps with a railing? : A Little 6 Click Score: 18    End of Session Equipment Utilized During Treatment: Gait belt Activity Tolerance: Patient tolerated treatment well Patient left: in chair;with call bell/phone within  reach Nurse Communication: Mobility status PT Visit Diagnosis: Difficulty in walking, not elsewhere classified (R26.2);Other abnormalities of gait and mobility (R26.89)     Time: 1057-1110 PT Time Calculation (min) (ACUTE ONLY): 13 min  Charges:  $Therapeutic Exercise: 8-22 mins                    Jannette Spanner PT, DPT Acute Rehabilitation Services Pager: [phone] Office: [place] 08/05/2021, 2:06 PM

## 2021-08-05 NOTE — Progress Notes (Signed)
Physical Therapy Treatment Patient Details Name: Becky Lawson MRN: [id number] DOB: 08/10/1951 Today's Date: 08/05/2021   History of Present Illness Pt is a 70 year old female s/p Left THA direct anterior approach with hx of chronic pain management.    PT Comments    Pt ambulated in hallway and practiced safe stair technique multiple times.  Pt reports pain improved and she feels ready for d/c home today.  Pt provided with HEP handout ,and pt had no further questions.    Recommendations for follow up therapy are one component of a multi-disciplinary discharge planning process, led by the attending physician.  Recommendations may be updated based on patient status, additional functional criteria and insurance authorization.  Follow Up Recommendations  Follow physician's recommendations for discharge plan and follow up therapies     Assistance Recommended at Discharge Intermittent Supervision/Assistance  Equipment Recommendations  BSC/3in1    Recommendations for Other Services       Precautions / Restrictions Precautions Precautions: Fall Restrictions Other Position/Activity Restrictions: WBAT     Mobility  Bed Mobility               General bed mobility comments: pt in recliner    Transfers Overall transfer level: Needs assistance Equipment used: Rolling walker (2 wheels) Transfers: Sit to/from Stand Sit to Stand: Supervision           General transfer comment: verbal cues for UE and LE positioning    Ambulation/Gait Ambulation/Gait assistance: Supervision;Min guard Gait Distance (Feet): 200 Feet Assistive device: Rolling walker (2 wheels) Gait Pattern/deviations: Step-to pattern;Decreased stance time - left;Antalgic       General Gait Details: verbal cues for sequence, RW positioning, posture   Stairs Stairs: Yes Stairs assistance: Min guard;Supervision Stair Management: Forwards;One rail Left;With cane;Step to pattern Number of Stairs:  3 General stair comments: verbal cues for safety, technique, sequence; pt performed 3 steps x3 (9 total) then took seated rest break; pt then performed 3 steps x2   Wheelchair Mobility    Modified Rankin (Stroke Patients Only)       Balance                                            Cognition Arousal/Alertness: Awake/alert Behavior During Therapy: WFL for tasks assessed/performed Overall Cognitive Status: Within Functional Limits for tasks assessed                                          Exercises     General Comments        Pertinent Vitals/Pain Pain Assessment: 0-10 Pain Score: 6  Pain Location: left hip Pain Descriptors / Indicators: Sore;Guarding;Aching Pain Intervention(s): Monitored during session;Repositioned    Home Living                          Prior Function            PT Goals (current goals can now be found in the care plan section) Progress towards PT goals: Progressing toward goals    Frequency    7X/week      PT Plan Current plan remains appropriate    Co-evaluation              AM-PAC PT "  6 Clicks" Mobility   Outcome Measure  Help needed turning from your back to your side while in a flat bed without using bedrails?: A Little Help needed moving from lying on your back to sitting on the side of a flat bed without using bedrails?: A Little Help needed moving to and from a bed to a chair (including a wheelchair)?: A Little Help needed standing up from a chair using your arms (e.g., wheelchair or bedside chair)?: A Little Help needed to walk in hospital room?: A Little Help needed climbing 3-5 steps with a railing? : A Little 6 Click Score: 18    End of Session Equipment Utilized During Treatment: Gait belt Activity Tolerance: Patient tolerated treatment well Patient left: in chair;with call bell/phone within reach Nurse Communication: Mobility status PT Visit Diagnosis:  Difficulty in walking, not elsewhere classified (R26.2);Other abnormalities of gait and mobility (R26.89)     Time: 1350-1400 PT Time Calculation (min) (ACUTE ONLY): 10 min  Charges:  $Gait Training: 8-22 mins                     Jannette Spanner PT, DPT Acute Rehabilitation Services Pager: [phone] Office: [place] 08/05/2021, 2:10 PM

## 2021-08-05 NOTE — Progress Notes (Signed)
Subjective: 2 Days Post-Op Procedure(s) (LRB): TOTAL HIP ARTHROPLASTY ANTERIOR APPROACH (Left) Patient reports pain as mild.   Patient seen in rounds for Dr. Alvan Dame. Patient is resting in bed on exam today. She does seem to be feeling better this morning, and became tearful when telling me that her pre-op hip pain is gone. She feels that not taking her pain medication the day before surgery led to her feeling so bad on the day of surgery and after. Voiding without difficulty.  We will continue therapy today.   Objective: Vital signs in last 24 hours: Temp:  [98.5 F (36.9 C)-99 F (37.2 C)] 98.7 F (37.1 C) (12/01 0446) Pulse Rate:  [80-90] 82 (12/01 0446) Resp:  [16-18] 16 (12/01 0446) BP: (153-170)/(64-78) 165/78 (12/01 0446) SpO2:  [95 %-96 %] 96 % (12/01 0446)  Intake/Output from previous day:  Intake/Output Summary (Last 24 hours) at 08/05/2021 0746 Last data filed at 08/05/2021 0200 Gross per 24 hour  Intake 360 ml  Output --  Net 360 ml     Intake/Output this shift: No intake/output data recorded.  Labs: Recent Labs    08/04/21 0319 08/05/21 0253  HGB 10.5* 10.1*   Recent Labs    08/04/21 0319 08/05/21 0253  WBC 13.0* 13.4*  RBC 3.36* 3.24*  HCT 31.7* 30.0*  PLT 230 223   Recent Labs    08/04/21 0319  NA 136  K 4.0  CL 104  CO2 26  BUN 14  CREATININE 0.93  GLUCOSE 201*  CALCIUM 8.3*   No results for input(s): LABPT, INR in the last 72 hours.  Exam: General - Patient is Alert and Oriented Extremity - Neurologically intact Sensation intact distally Intact pulses distally Dorsiflexion/Plantar flexion intact Dressing - dressing C/D/I Motor Function - intact, moving foot and toes well on exam.   Past Medical History:  Diagnosis Date   Arthritis    Benign cyst of right kidney 2014   2.7 cm right kidney cyst seen on imaging 2014 but was c/o right flank pain with new persistent proteinuria (fortunately hematuria had resolved) so repeat US  11/2016 showed right kidney still with 2.6 cm simple cyst and otherwise nml.   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, genotype 1b, followed at [facility]   Hyperlipidemia    Hypertension    Hypothyroidism    Jaundice 11/01/2012   Pruritic disorder 02/13/2013   Sleep apnea    Was diagnosed approximately 20 years ago, but does not wear CPAP patient stated "I gave it back.Marland KitchenMarland KitchenI couldn't wear that thing it was horrible"   Spinal stenosis    Thyroid disease    [employer]reesia 06/15/2020    Assessment/Plan: 2 Days Post-Op Procedure(s) (LRB): TOTAL HIP ARTHROPLASTY ANTERIOR APPROACH (Left) Principal Problem:   S/P total left hip arthroplasty  Estimated body mass index is 32.62 kg/m as calculated from the following:   Height as of this encounter: 5' 4.5" (1.638 m).   Weight as of this encounter: 87.5 kg. Advance diet Up with therapy D/C IV fluids  DVT Prophylaxis - Aspirin Weight bearing as tolerated.  Plan is to go Home after hospital stay. Plan for discharge today after 1-2 sessions of therapy. Follow up in  the office in 2 weeks.  Pain is being managed by pain provider, and has been sent prior to surgery.   Griffith Citron, PA-C Orthopedic Surgery [phone] 08/05/2021, 7:46 AM

## 2021-08-13 ENCOUNTER — Encounter: Payer: Self-pay | Admitting: Behavioral Health

## 2021-08-13 ENCOUNTER — Ambulatory Visit (INDEPENDENT_AMBULATORY_CARE_PROVIDER_SITE_OTHER): Payer: PPO | Admitting: Behavioral Health

## 2021-08-13 DIAGNOSIS — F411 Generalized anxiety disorder: Secondary | ICD-10-CM | POA: Diagnosis not present

## 2021-08-13 DIAGNOSIS — F3162 Bipolar disorder, current episode mixed, moderate: Secondary | ICD-10-CM

## 2021-08-13 DIAGNOSIS — R5383 Other fatigue: Secondary | ICD-10-CM | POA: Diagnosis not present

## 2021-08-13 NOTE — Progress Notes (Addendum)
Becky Lawson [id number] 24-Jul-1951 70 y.o.  Virtual Visit via Telephone Note  I connected with pt on 08/13/21 at  1:00 PM EST by telephone and verified that I am speaking with the correct person using two identifiers.   I discussed the limitations, risks, security and privacy concerns of performing an evaluation and management service by telephone and the availability of in person appointments. I also discussed with the patient that there may be a patient responsible charge related to this service. The patient expressed understanding and agreed to proceed.   I discussed the assessment and treatment plan with the patient. The patient was provided an opportunity to ask questions and all were answered. The patient agreed with the plan and demonstrated an understanding of the instructions.   The patient was advised to call back or seek an in-person evaluation if the symptoms worsen or if the condition fails to improve as anticipated.  I provided 20  minutes of non-face-to-face time during this encounter.  The patient was located at home.  The provider was located at [place].   Elwanda [place], NP   Subjective:   Patient ID:  Becky Lawson is a 70 y.o. (DOB 1950-12-21) female.  Chief Complaint:  Chief Complaint  Patient presents with   Depression   Anxiety   Medication Problem   Follow-up    HPI  70 year old female presented via telephone visit for follow up and medication management. She is prior patient of Donnal Moat. She feels like Latuda 60 mg has helped with the rapid cycling but she is not quiet where she would like to be. She would like to consider increasing the Latuda again after she heals from hip replacement surgery. She feels like she needs to increase the dose since she has previously had success with Latuda in the past.  She reports her anxiety today at 4/10 and depression at 3/10. She is sleeping 7-8 hours with aid of medication. No current mania, or mixed  episodes. No  psychosis. No SI/HI.   Past medications for mental health diagnoses include: Lithium, Seroquel, Effexor, Prozac, Latuda, Abilify, Dexedrine, Cymbalta caused weight gain.  Modafinil.   Review of Systems:  Review of Systems  Constitutional: Negative.   Cardiovascular:  Negative for palpitations.  Allergic/Immunologic: Negative.   Neurological:  Negative for tremors and weakness.  Psychiatric/Behavioral:  Positive for dysphoric mood. The patient is nervous/anxious.    Medications: I have reviewed the patient's current medications.  Current Outpatient Medications  Medication Sig Dispense Refill   acetaminophen (TYLENOL) 325 MG tablet Take 3 tablets (975 mg total) by mouth every 6 (six) hours as needed.     ALPRAZolam (XANAX) 1 MG tablet TAKE 1 TABLET BY MOUTH 2 TIMES DAILY AS NEEDED FOR ANXIETY. (Patient taking differently: Take 1 mg by mouth daily.) 60 tablet 0   amLODipine (NORVASC) 5 MG tablet Take 1 tablet (5 mg total) by mouth daily. 90 tablet 1   aspirin 81 MG chewable tablet Chew 1 tablet (81 mg total) by mouth 2 (two) times daily for 28 days. 56 tablet 0   celecoxib (CELEBREX) 200 MG capsule Take 1 capsule (200 mg total) by mouth 2 (two) times daily. 60 capsule 0   Cholecalciferol (VITAMIN D-3) 5000 units TABS Take 5,000 Units daily by mouth. In addition to the rx high dose once weekly vitamin D of 50Ku. 30 tablet    dextroamphetamine (DEXTROSTAT) 10 MG tablet Take 1 tablet (10 mg total) by mouth in  the morning. 30 tablet 0   docusate sodium (COLACE) 100 MG capsule Take 1 capsule (100 mg total) by mouth 2 (two) times daily. 10 capsule 0   fluticasone (FLONASE) 50 MCG/ACT nasal spray Place 2 sprays into both nostrils daily. 16 g 6   hydrOXYzine (ATARAX/VISTARIL) 25 MG tablet Take 1 tablet (25 mg total) by mouth every 8 (eight) hours as needed. 60 tablet 5   lamoTRIgine (LAMICTAL) 200 MG tablet Take 1 tablet (200 mg total) by mouth 2 (two) times daily. 180 tablet 1    levothyroxine (SYNTHROID) 100 MCG tablet TAKE 1 TABLET BY MOUTH DAILY BEFORE BREAKFAST. 90 tablet 1   Lurasidone HCl (LATUDA) 60 MG TABS Take 1 tablet (60 mg total) by mouth daily. 30 tablet 2   methocarbamol (ROBAXIN) 500 MG tablet Take 1 tablet (500 mg total) by mouth every 6 (six) hours as needed for muscle spasms. 40 tablet 0   omeprazole (PRILOSEC) 40 MG capsule Take 1 capsule (40 mg total) by mouth 2 (two) times daily. (Patient taking differently: Take 40 mg by mouth daily.) 180 capsule 1   polyethylene glycol (MIRALAX / GLYCOLAX) 17 g packet Take 17 g by mouth daily as needed for mild constipation. 14 each 0   pravastatin (PRAVACHOL) 40 MG tablet Take 1 tablet (40 mg total) by mouth daily. 90 tablet 3   trazodone (DESYREL) 300 MG tablet Take 1.5 tablets (450 mg total) by mouth at bedtime. (Patient taking differently: Take 300 mg by mouth at bedtime.) 135 tablet 1   Vitamin D, Ergocalciferol, (DRISDOL) 1.25 MG (50000 UNIT) CAPS capsule Take 1 capsule (50,000 Units total) by mouth every 7 (seven) days. Sunday 12 capsule 3   Current Facility-Administered Medications  Medication Dose Route Frequency Provider Last Rate Last Admin   cyanocobalamin ((VITAMIN B-12)) injection 1,000 mcg  1,000 mcg Intramuscular Q30 days Shawnee Knapp, MD   1,000 mcg at 07/12/18 1652    Medication Side Effects: None  Allergies: No Known Allergies  Past Medical History:  Diagnosis Date   Arthritis    Benign cyst of right kidney 2014   2.7 cm right kidney cyst seen on imaging 2014 but was c/o right flank pain with new persistent proteinuria (fortunately hematuria had resolved) so repeat US 11/2016 showed right kidney still with 2.6 cm simple cyst and otherwise nml.   Bipolar 1 disorder ([place])    Cancer ([place])    Phreesia 06/15/2020   Chronic kidney disease    Depression    Depression    Phreesia 06/15/2020   GERD (gastroesophageal reflux disease)    Heart murmur    Hepatitis C    resolved completely after  treatment in 2014, genotype 1b, followed at [facility]   Hyperlipidemia    Hypertension    Hypothyroidism    Jaundice 11/01/2012   Pruritic disorder 02/13/2013   Sleep apnea    Was diagnosed approximately 20 years ago, but does not wear CPAP patient stated "I gave it back.Marland KitchenMarland KitchenI couldn't wear that thing it was horrible"   Spinal stenosis    Thyroid disease    Phreesia 06/15/2020    Family History  Problem Relation Age of Onset   Diabetes Mother    Heart disease Mother    Hyperlipidemia Mother    Hypertension Mother    Stroke Mother    Hypertension Father    Parkinson's disease Father    Heart disease Father    Hyperlipidemia Father    Hemachromatosis Daughter    Thyroid  disease Neg Hx    Colon cancer Neg Hx    Esophageal cancer Neg Hx    Inflammatory bowel disease Neg Hx    Liver disease Neg Hx    Pancreatic cancer Neg Hx    Rectal cancer Neg Hx    Stomach cancer Neg Hx     Social History   Socioeconomic History   Marital status: Divorced    Spouse name: Not on file   Number of children: Not on file   Years of education: Not on file   Highest education level: Not on file  Occupational History   Occupation: Personal Caregiver  Tobacco Use   Smoking status: Former    Packs/day: 1.00    Years: 15.00    Pack years: 15.00    Types: Cigarettes    Quit date: 06/30/1979    Years since quitting: 42.1   Smokeless tobacco: Never  Vaping Use   Vaping Use: Never used  Substance and Sexual Activity   Alcohol use: Not Currently    Alcohol/week: 0.0 - 1.0 standard drinks   Drug use: No   Sexual activity: Yes    Birth control/protection: None  Other Topics Concern   Not on file  Social History Narrative   Divorced. Education: [employer]. Exercise: No.   Social Determinants of Health   Financial Resource Strain: Not on file  Food Insecurity: Not on file  Transportation Needs: Not on file  Physical Activity: Not on file  Stress: Not on file  Social Connections: Not on file   Intimate Partner Violence: Not on file    Past Medical History, Surgical history, Social history, and Family history were reviewed and updated as appropriate.   Please see review of systems for further details on the patient's review from today.   Objective:   Physical Exam:  There were no vitals taken for this visit.  Physical Exam Musculoskeletal:        General: Tenderness present.  Neurological:     Mental Status: She is alert and oriented to person, place, and time.  Psychiatric:        Attention and Perception: Attention and perception normal.        Mood and Affect: Mood normal.        Speech: Speech normal.        Behavior: Behavior normal. Behavior is cooperative.        Cognition and Memory: Cognition and memory normal.        Judgment: Judgment normal.     Comments: Insight intact    Lab Review:     Component Value Date/Time   NA 136 08/04/2021 0319   NA 140 04/30/2020 1436   K 4.0 08/04/2021 0319   CL 104 08/04/2021 0319   CO2 26 08/04/2021 0319   GLUCOSE 201 (H) 08/04/2021 0319   BUN 14 08/04/2021 0319   BUN 13 04/30/2020 1436   CREATININE 0.93 08/04/2021 0319   CREATININE 0.79 07/05/2016 0825   CALCIUM 8.3 (L) 08/04/2021 0319   PROT 7.0 07/21/2021 1438   PROT 6.4 04/30/2020 1436   ALBUMIN 4.4 07/21/2021 1438   ALBUMIN 4.4 04/30/2020 1436   AST 16 07/21/2021 1438   ALT 10 07/21/2021 1438   ALKPHOS 74 07/21/2021 1438   BILITOT 0.5 07/21/2021 1438   BILITOT 0.2 04/30/2020 1436   GFRNONAA >60 08/04/2021 0319   GFRAA 80 04/30/2020 1436       Component Value Date/Time   WBC 13.4 (H) 08/05/2021 0253  RBC 3.24 (L) 08/05/2021 0253   HGB 10.1 (L) 08/05/2021 0253   HGB 14.0 04/30/2020 1436   HCT 30.0 (L) 08/05/2021 0253   HCT 42.0 04/30/2020 1436   PLT 223 08/05/2021 0253   PLT 237 04/30/2020 1436   MCV 92.6 08/05/2021 0253   MCV 95 04/30/2020 1436   MCH 31.2 08/05/2021 0253   MCHC 33.7 08/05/2021 0253   RDW 12.6 08/05/2021 0253   RDW 12.2  04/30/2020 1436   LYMPHSABS 1.4 12/05/2018 1642   MONOABS 0.5 05/24/2013 0953   EOSABS 0.0 12/05/2018 1642   BASOSABS 0.0 12/05/2018 1642    Lithium Lvl  Date Value Ref Range Status  10/04/2009 0.43 (L) 0.80 - 1.40 mEq/L Final     No results found for: PHENYTOIN, PHENOBARB, VALPROATE, CBMZ   .res Assessment: Plan:    Audreena was seen today for depression, anxiety, medication problem and follow-up.  Diagnoses and all orders for this visit:  Fatigue, unspecified type  Generalized anxiety disorder  Bipolar 1 disorder, mixed, moderate (HCC)  Recommendations:  Greater than 50% of 20 min of non-face to face time via telephone was spent on counseling and coordination of care. Since increasing Latuda to 60 mg , her rapid cycling has improved. She has also had recent hip replacement surgery and still recovering. She agrees that no psychotropic medication changes should occur until she is off of pain medication and recovered.  Discussed xanax with her and told her we would have to come up with long term plan to wean.   PDMP was reviewed.   Continue  Latuda 60 mg daily. Continue hydroxyzine 25 mg nightly 8H as needed. Continue  Lamictal  350 mg daily. 200 mg in the am and 150 mg in the evening. Continue trazodone 300 mg, 1.5 pills nightly.   Instructed pt that she must consume 350 cal meal when taking latuda Return in 4 weeks to reassess. She would like to do one more tele-visit until she is fully ambulatory. Discussed potential benefits, risks, and side effects of stimulants with patient to include increased heart rate, palpitations, insomnia, increased anxiety, increased irritability, or decreased appetite.  Instructed patient to contact office if experiencing any significant tolerability issues.  Discussed potential metabolic side effects associated with atypical antipsychotics, as well as potential risk for movement side effects. Advised pt to contact office if movement side effects  occur.         Please see After Visit Summary for patient specific instructions.  Future Appointments  Date Time Provider [place]  09/01/2021  9:30 AM [facility] PHARMACIST [facility]  01/24/2022  2:40 PM Wendie Agreste, MD [facility]    No orders of the defined types were placed in this encounter.     -------------------------------

## 2021-08-17 DIAGNOSIS — M5136 Other intervertebral disc degeneration, lumbar region: Secondary | ICD-10-CM | POA: Diagnosis not present

## 2021-08-17 DIAGNOSIS — M25559 Pain in unspecified hip: Secondary | ICD-10-CM | POA: Diagnosis not present

## 2021-08-17 NOTE — Discharge Summary (Signed)
Physician Discharge Summary   Patient ID: Becky Lawson MRN: [id number] DOB/AGE: 70-May-1952 27 y.o.  Admit date: 08/03/2021 Discharge date: 08/05/2021  Primary Diagnosis: Left  hip osteoarthritis.   Admission Diagnoses:  Past Medical History:  Diagnosis Date   Arthritis    Benign cyst of right kidney 2014   2.7 cm right kidney cyst seen on imaging 2014 but was c/o right flank pain with new persistent proteinuria (fortunately hematuria had resolved) so repeat US 11/2016 showed right kidney still with 2.6 cm simple cyst and otherwise nml.   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, genotype 1b, followed at [facility]   Hyperlipidemia    Hypertension    Hypothyroidism    Jaundice 11/01/2012   Pruritic disorder 02/13/2013   Sleep apnea    Was diagnosed approximately 20 years ago, but does not wear CPAP patient stated "I gave it back.Marland KitchenMarland KitchenI couldn't wear that thing it was horrible"   Spinal stenosis    Thyroid disease    Phreesia 06/15/2020   Discharge Diagnoses:   Principal Problem:   S/P total left hip arthroplasty  Estimated body mass index is 32.62 kg/m as calculated from the following:   Height as of this encounter: 5' 4.5" (1.638 m).   Weight as of this encounter: 87.5 kg.  Procedure:  Procedure(s) (LRB): TOTAL HIP ARTHROPLASTY ANTERIOR APPROACH (Left)   Consults: None  HPI:  Becky Lawson is a 70 y.o. female who had   presented to office for evaluation of left hip pain.  Radiographs revealed   progressive degenerative changes with bone-on-bone   articulation of the  hip joint, including subchondral cystic changes and osteophytes.  The patient had painful limited range of   motion significantly affecting their overall quality of life and function.  The  patient was failing to    respond to conservative measures including medications and/or injections and activity modification and at this point was ready   to proceed with more definitive measures.  Consent was obtained for   benefit of pain relief.  Specific risks of infection, DVT, component   failure, dislocation, neurovascular injury, and need for revision surgery were reviewed in the office as well discussion of   the anterior versus posterior approach were reviewed.  Laboratory Data: Admission on 08/03/2021, Discharged on 08/05/2021  Component Date Value Ref Range Status   SARS Coronavirus 2 08/03/2021 NEGATIVE  NEGATIVE Final   Comment: (NOTE) SARS-CoV-2 target nucleic acids are NOT DETECTED.  The SARS-CoV-2 RNA is generally detectable in upper and lower respiratory specimens during the acute phase of infection. The lowest concentration of SARS-CoV-2 viral copies this assay can detect is 250 copies / mL. A negative result does not preclude SARS-CoV-2 infection and should not be used as the sole basis for treatment or other patient management decisions.  A negative result may occur with improper specimen collection / handling, submission of specimen other than nasopharyngeal swab, presence of viral mutation(s) within the areas targeted by this assay, and inadequate number of viral copies (<250 copies / mL). A negative result must be combined with clinical observations, patient history, and epidemiological information.  Fact Sheet for Patients:   StrictlyIdeas.no  Fact Sheet for Healthcare  Providers: BankingDealers.co.za  This test is not yet approved or                           cleared by the [place] and has been authorized for detection and/or diagnosis of SARS-CoV-2 by FDA under an Emergency Use Authorization (EUA).  This EUA will remain in effect (meaning this test can be used) for the duration of the COVID-19  declaration under Section 564(b)(1) of the Act, 21 U.S.C. section 360bbb-3(b)(1), unless the authorization is terminated or revoked sooner.  Performed at [facility], [place] [address], [place], [place] [address]    WBC 08/04/2021 13.0 (H)  4.0 - 10.5 K/uL Final   RBC 08/04/2021 3.36 (L)  3.87 - 5.11 MIL/uL Final   Hemoglobin 08/04/2021 10.5 (L)  12.0 - 15.0 g/dL Final   HCT 08/04/2021 31.7 (L)  36.0 - 46.0 % Final   MCV 08/04/2021 94.3  80.0 - 100.0 fL Final   MCH 08/04/2021 31.3  26.0 - 34.0 pg Final   MCHC 08/04/2021 33.1  30.0 - 36.0 g/dL Final   RDW 08/04/2021 12.3  11.5 - 15.5 % Final   Platelets 08/04/2021 230  150 - 400 K/uL Final   nRBC 08/04/2021 0.0  0.0 - 0.2 % Final   Performed at [facility], [place] [address] [place], [place] [address]   Sodium 08/04/2021 136  135 - 145 mmol/L Final   Potassium 08/04/2021 4.0  3.5 - 5.1 mmol/L Final   Chloride 08/04/2021 104  98 - 111 mmol/L Final   CO2 08/04/2021 26  22 - 32 mmol/L Final   Glucose, Bld 08/04/2021 201 (H)  70 - 99 mg/dL Final   Glucose reference range applies only to samples taken after fasting for at least 8 hours.   BUN 08/04/2021 14  8 - 23 mg/dL Final   Creatinine, Ser 08/04/2021 0.93  0.44 - 1.00 mg/dL Final   Calcium 08/04/2021 8.3 (L)  8.9 - 10.3 mg/dL Final   GFR, Estimated 08/04/2021 >60  >60 mL/min Final   Comment: (NOTE) Calculated using the CKD-EPI Creatinine Equation (2021)    Anion gap 08/04/2021 6  5 - 15 Final   Performed at [facility], [place] [address] [place], [place] [address]   WBC 08/05/2021 13.4 (H)  4.0 - 10.5 K/uL Final   RBC 08/05/2021 3.24 (L)  3.87 - 5.11 MIL/uL Final   Hemoglobin 08/05/2021 10.1 (L)  12.0 - 15.0 g/dL Final   HCT 08/05/2021 30.0 (L)  36.0 - 46.0 % Final   MCV 08/05/2021 92.6  80.0 - 100.0 fL Final   MCH 08/05/2021 31.2  26.0 - 34.0 pg Final   MCHC 08/05/2021 33.7  30.0 - 36.0 g/dL Final   RDW 08/05/2021 12.6  11.5  - 15.5 % Final   Platelets 08/05/2021 223  150 - 400 K/uL Final   nRBC 08/05/2021 0.0  0.0 - 0.2 % Final   Performed at [facility], [place] [address] [place], [place] [address]  Hospital Outpatient Visit on 07/21/2021  Component Date Value Ref Range Status   MRSA, PCR 07/21/2021 POSITIVE (A)  NEGATIVE Final   Comment: RESULT CALLED TO, READ BACK BY AND VERIFIED WITH: HESTER,T. RN AT 1613 07/21/21 MULLINS,T    Staphylococcus aureus 07/21/2021 POSITIVE (A)  NEGATIVE Final   Comment: RESULT CALLED TO, READ BACK BY AND VERIFIED WITH: HESTER,T. RN AT 7824 07/21/21 MULLINS,T (  NOTE) The Xpert SA Assay (FDA approved for NASAL specimens in patients 24 years of age and older), is one component of a comprehensive surveillance program. It is not intended to diagnose infection nor to guide or monitor treatment. Performed at [facility], [place] [address] [place], [place] [address]    WBC 07/21/2021 5.9  4.0 - 10.5 K/uL Final   RBC 07/21/2021 4.48  3.87 - 5.11 MIL/uL Final   Hemoglobin 07/21/2021 13.7  12.0 - 15.0 g/dL Final   HCT 07/21/2021 42.2  36.0 - 46.0 % Final   MCV 07/21/2021 94.2  80.0 - 100.0 fL Final   MCH 07/21/2021 30.6  26.0 - 34.0 pg Final   MCHC 07/21/2021 32.5  30.0 - 36.0 g/dL Final   RDW 07/21/2021 12.1  11.5 - 15.5 % Final   Platelets 07/21/2021 289  150 - 400 K/uL Final   nRBC 07/21/2021 0.0  0.0 - 0.2 % Final   Performed at [facility], [place] [address] [place], [place] [address]   Sodium 07/21/2021 136  135 - 145 mmol/L Final   Potassium 07/21/2021 4.2  3.5 - 5.1 mmol/L Final   Chloride 07/21/2021 102  98 - 111 mmol/L Final   CO2 07/21/2021 26  22 - 32 mmol/L Final   Glucose, Bld 07/21/2021 147 (H)  70 - 99 mg/dL Final   Glucose reference range applies only to samples taken after fasting for at least 8 hours.   BUN 07/21/2021 13  8 - 23 mg/dL Final   Creatinine, Ser 07/21/2021 0.77  0.44 - 1.00 mg/dL Final    Calcium 07/21/2021 9.2  8.9 - 10.3 mg/dL Final   Total Protein 07/21/2021 7.0  6.5 - 8.1 g/dL Final   Albumin 07/21/2021 4.4  3.5 - 5.0 g/dL Final   AST 07/21/2021 16  15 - 41 U/L Final   ALT 07/21/2021 10  0 - 44 U/L Final   Alkaline Phosphatase 07/21/2021 74  38 - 126 U/L Final   Total Bilirubin 07/21/2021 0.5  0.3 - 1.2 mg/dL Final   GFR, Estimated 07/21/2021 >60  >60 mL/min Final   Comment: (NOTE) Calculated using the CKD-EPI Creatinine Equation (2021)    Anion gap 07/21/2021 8  5 - 15 Final   Performed at [facility], [place] [address] [place], [place] [address]   ABO/RH(D) 07/21/2021 A NEG   Final   Antibody Screen 07/21/2021 NEG   Final   Sample Expiration 07/21/2021 08/04/2021,2359   Final   Extend sample reason 07/21/2021    Final                   Value:NO TRANSFUSIONS OR PREGNANCY IN THE PAST 3 MONTHS Performed at [place] [address] [place], [place] [address]      X-Rays:DG Pelvis Portable  Result Date: 08/03/2021 CLINICAL DATA:  Postop EXAM: PORTABLE PELVIS 1-2 VIEWS COMPARISON:  MRI 03/26/2020 FINDINGS: Status post left hip replacement with intact hardware and normal alignment. Gas in the soft tissues consistent with recent surgery. Moderate arthritis of the right hip IMPRESSION: Status post left hip replacement with expected postsurgical changes Electronically Signed   By: Donavan Foil M.D.   On: 08/03/2021 16:42   DG C-Arm 1-60 Min-No Report  Result Date: 08/03/2021 Fluoroscopy was utilized by the requesting physician.  No radiographic interpretation.   DG HIP OPERATIVE UNILAT WITH PELVIS LEFT  Result Date: 08/03/2021 CLINICAL DATA:  Hip replacement EXAM: OPERATIVE left  HIP (WITH PELVIS IF PERFORMED) 3 VIEWS TECHNIQUE: Fluoroscopic spot image(s) were submitted for interpretation post-operatively. COMPARISON:  11/05/2016 FINDINGS: Three low resolution intraoperative spot views of the left hip. Total fluoroscopy time was  8 seconds. Images were obtained during operative placement of left hip replacement. IMPRESSION: Intraoperative fluoroscopic assistance provided during left hip replacement Electronically Signed   By: Donavan Foil M.D.   On: 08/03/2021 16:42    EKG: Orders placed or performed during the hospital encounter of 07/21/21   EKG 12 lead per protocol   EKG 12 lead per protocol     Hospital Course: Becky Lawson is a 70 y.o. who was admitted to [facility]. They were brought to the operating room on 08/03/2021 and underwent Procedure(s): TOTAL HIP ARTHROPLASTY ANTERIOR APPROACH.  Patient tolerated the procedure well and was later transferred to the recovery room and then to the orthopaedic floor for postoperative care. They were given PO and IV analgesics for pain control following their surgery. They were given 24 hours of postoperative antibiotics of  Anti-infectives (From admission, onward)    Start     Dose/Rate Route Frequency Ordered Stop   08/03/21 2124  ceFAZolin (ANCEF) 2-4 GM/100ML-% IVPB       Note to Pharmacy: Myriam Jacobson G: cabinet override      08/03/21 2124 08/04/21 0929   08/03/21 2000  ceFAZolin (ANCEF) IVPB 2g/100 mL premix        2 g 200 mL/hr over 30 Minutes Intravenous Every 6 hours 08/03/21 1756 08/04/21 0727   08/03/21 1207  ceFAZolin (ANCEF) 2-4 GM/100ML-% IVPB       Note to Pharmacy: Randa Evens D: cabinet override      08/03/21 1207 08/04/21 0014   08/03/21 1207  vancomycin (VANCOCIN) 1-5 GM/200ML-% IVPB       Note to Pharmacy: Randa Evens D: cabinet override      08/03/21 1207 08/04/21 0014   08/03/21 1145  ceFAZolin (ANCEF) IVPB 2g/100 mL premix        2 g 200 mL/hr over 30 Minutes Intravenous On call to O.R. 08/03/21 1131 08/03/21 1433   08/03/21 1145  vancomycin (VANCOCIN) IVPB 1000 mg/200 mL premix        1,000 mg 200 mL/hr over 60 Minutes Intravenous On call to O.R. 08/03/21 1131 08/03/21 1414   08/03/21 1145  vancomycin (VANCOCIN) IVPB  1000 mg/200 mL premix  Status:  Discontinued        1,000 mg 200 mL/hr over 60 Minutes Intravenous  Once 08/03/21 1131 08/03/21 1133      and started on DVT prophylaxis in the form of Aspirin.   PT and OT were ordered for total joint protocol. Discharge planning consulted to help with postop disposition and equipment needs.  Patient had a difficult night on the evening of surgery related to pain control and anxiety. They started to get up OOB with therapy on POD #1. Pt was seen during rounds and was ready to go home pending progress with therapy.She worked with therapy on POD #1 and was meeting her goals. Pt was discharged to home later that day in stable condition.  Diet: Regular diet Activity: WBAT Follow-up: in 2 weeks Disposition: Home Discharged Condition: good   Discharge Instructions     Call MD / Call 911   Complete by: As directed    If you experience chest pain or shortness of breath, CALL 911 and be transported to the hospital emergency room.  If you develope a  fever above 101 F, pus (white drainage) or increased drainage or redness at the wound, or calf pain, call your surgeon's office.   Change dressing   Complete by: As directed    Maintain surgical dressing until follow up in the clinic. If the edges start to pull up, may reinforce with tape. If the dressing is no longer working, may remove and cover with gauze and tape, but must keep the area dry and clean.  Call with any questions or concerns.   Constipation Prevention   Complete by: As directed    Drink plenty of fluids.  Prune juice may be helpful.  You may use a stool softener, such as Colace (over the counter) 100 mg twice a day.  Use MiraLax (over the counter) for constipation as needed.   Diet - low sodium heart healthy   Complete by: As directed    Increase activity slowly as tolerated   Complete by: As directed    Weight bearing as tolerated with assist device (walker, cane, etc) as directed, use it as long as  suggested by your surgeon or therapist, typically at least 4-6 weeks.   Post-operative opioid taper instructions:   Complete by: As directed    POST-OPERATIVE OPIOID TAPER INSTRUCTIONS: It is important to wean off of your opioid medication as soon as possible. If you do not need pain medication after your surgery it is ok to stop day one. Opioids include: Codeine, Hydrocodone(Norco, Vicodin), Oxycodone(Percocet, oxycontin) and hydromorphone amongst others.  Long term and even short term use of opiods can cause: Increased pain response Dependence Constipation Depression Respiratory depression And more.  Withdrawal symptoms can include Flu like symptoms Nausea, vomiting And more Techniques to manage these symptoms Hydrate well Eat regular healthy meals Stay active Use relaxation techniques(deep breathing, meditating, yoga) Do Not substitute Alcohol to help with tapering If you have been on opioids for less than two weeks and do not have pain than it is ok to stop all together.  Plan to wean off of opioids This plan should start within one week post op of your joint replacement. Maintain the same interval or time between taking each dose and first decrease the dose.  Cut the total daily intake of opioids by one tablet each day Next start to increase the time between doses. The last dose that should be eliminated is the evening dose.      TED hose   Complete by: As directed    Use stockings (TED hose) for 2 weeks on both leg(s).  You may remove them at night for sleeping.      Allergies as of 08/05/2021   No Known Allergies      Medication List     STOP taking these medications    HYDROcodone-acetaminophen 10-325 MG tablet Commonly known as: NORCO       TAKE these medications    acetaminophen 325 MG tablet Commonly known as: TYLENOL Take 3 tablets (975 mg total) by mouth every 6 (six) hours as needed.   ALPRAZolam 1 MG tablet Commonly known as: XANAX TAKE 1  TABLET BY MOUTH 2 TIMES DAILY AS NEEDED FOR ANXIETY. What changed: when to take this   amLODipine 5 MG tablet Commonly known as: NORVASC Take 1 tablet (5 mg total) by mouth daily.   aspirin 81 MG chewable tablet Chew 1 tablet (81 mg total) by mouth 2 (two) times daily for 28 days.   celecoxib 200 MG capsule Commonly known as: CELEBREX Take 1  capsule (200 mg total) by mouth 2 (two) times daily.   dextroamphetamine 10 MG tablet Commonly known as: DEXTROSTAT Take 1 tablet (10 mg total) by mouth in the morning.   docusate sodium 100 MG capsule Commonly known as: COLACE Take 1 capsule (100 mg total) by mouth 2 (two) times daily.   fluticasone 50 MCG/ACT nasal spray Commonly known as: FLONASE Place 2 sprays into both nostrils daily.   hydrOXYzine 25 MG tablet Commonly known as: ATARAX Take 1 tablet (25 mg total) by mouth every 8 (eight) hours as needed.   lamoTRIgine 200 MG tablet Commonly known as: LAMICTAL Take 1 tablet (200 mg total) by mouth 2 (two) times daily.   Latuda 60 MG Tabs Generic drug: Lurasidone HCl Take 1 tablet (60 mg total) by mouth daily.   levothyroxine 100 MCG tablet Commonly known as: SYNTHROID TAKE 1 TABLET BY MOUTH DAILY BEFORE BREAKFAST.   methocarbamol 500 MG tablet Commonly known as: ROBAXIN Take 1 tablet (500 mg total) by mouth every 6 (six) hours as needed for muscle spasms.   omeprazole 40 MG capsule Commonly known as: PRILOSEC Take 1 capsule (40 mg total) by mouth 2 (two) times daily. What changed: when to take this   polyethylene glycol 17 g packet Commonly known as: MIRALAX / GLYCOLAX Take 17 g by mouth daily as needed for mild constipation.   pravastatin 40 MG tablet Commonly known as: PRAVACHOL Take 1 tablet (40 mg total) by mouth daily.   trazodone 300 MG tablet Commonly known as: DESYREL Take 1.5 tablets (450 mg total) by mouth at bedtime. What changed: how much to take   Vitamin D (Ergocalciferol) 1.25 MG (50000 UNIT)  Caps capsule Commonly known as: DRISDOL Take 1 capsule (50,000 Units total) by mouth every 7 (seven) days. Sunday   Vitamin D-3 125 MCG (5000 UT) Tabs Take 5,000 Units daily by mouth. In addition to the rx high dose once weekly vitamin D of 50Ku.               Discharge Care Instructions  (From admission, onward)           Start     Ordered   08/04/21 0000  Change dressing       Comments: Maintain surgical dressing until follow up in the clinic. If the edges start to pull up, may reinforce with tape. If the dressing is no longer working, may remove and cover with gauze and tape, but must keep the area dry and clean.  Call with any questions or concerns.   08/04/21 6967            Follow-up Information     Irving Copas, PA-C. Go on 08/18/2021.   Specialty: Orthopedic Surgery Why: You are scheduled for a follow up appointment on 08-18-21 at 3:15 pm. Contact information: [address] [place] [address] [phone]                 Signed: Griffith Citron, PA-C Orthopedic Surgery 08/17/2021, 2:47 PM

## 2021-08-28 ENCOUNTER — Other Ambulatory Visit: Payer: Self-pay | Admitting: Behavioral Health

## 2021-09-01 ENCOUNTER — Ambulatory Visit: Payer: PPO

## 2021-09-01 ENCOUNTER — Telehealth: Payer: Self-pay | Admitting: Behavioral Health

## 2021-09-01 DIAGNOSIS — Z96642 Presence of left artificial hip joint: Secondary | ICD-10-CM | POA: Diagnosis not present

## 2021-09-01 DIAGNOSIS — Z4789 Encounter for other orthopedic aftercare: Secondary | ICD-10-CM | POA: Diagnosis not present

## 2021-09-01 DIAGNOSIS — Z471 Aftercare following joint replacement surgery: Secondary | ICD-10-CM | POA: Diagnosis not present

## 2021-09-01 NOTE — Telephone Encounter (Signed)
Pended.

## 2021-09-01 NOTE — Telephone Encounter (Signed)
Last filled 10/31 appt on 09/21/21

## 2021-09-01 NOTE — Telephone Encounter (Signed)
Next visit is 09/21/21. Requesting refill on Xanax and Dexedrine called to:  [id number] - [place], [place] [address].  Phone:  [phone]  Fax:  [phone]

## 2021-09-07 ENCOUNTER — Telehealth: Payer: Self-pay | Admitting: Pharmacist

## 2021-09-07 NOTE — Progress Notes (Signed)
Chronic Care Management Pharmacy Note  09/09/2021 Name:  Becky Lawson MRN:  [id number] DOB:  [birth year]  Summary: Initial visit with PharmD.   BP seems to be fluctuating and most recently elevated per her reports.  Did not bring logs.  Med list updated, she is recovering well from hip replacement.  Does mention ongoing depression and is followed by psychology.  ASCVD risk 36.1 - high risk  Recommendations/Changes made from today's visit: Check BP x 2 weeks will FU for update Set up with pill packs for adherence and delivery Potential change -- could consider switch to Atorvastatin 64m for high intensity statin due to ASCVD risk  Plan: FU 4 months PharmD CMA 2 week BP check   Subjective: Becky ABPLANALPis an 71y.o. year old female who is a primary patient of GWendie Agreste MD.  The CCM team was consulted for assistance with disease management and care coordination needs.    Engaged with patient by telephone for initial visit in response to provider referral for pharmacy case management and/or care coordination services.   Consent to Services:  The patient was given the following information about Chronic Care Management services today, agreed to services, and gave verbal consent: 1. CCM service includes personalized support from designated clinical staff supervised by the primary care provider, including individualized plan of care and coordination with other care providers 2. 24/7 contact phone numbers for assistance for urgent and routine care needs. 3. Service will only be billed when office clinical staff spend 20 minutes or more in a month to coordinate care. 4. Only one practitioner may furnish and bill the service in a calendar month. 5.The patient may stop CCM services at any time (effective at the end of the month) by phone call to the office staff. 6. The patient will be responsible for cost sharing (co-pay) of up to 20% of the service fee (after annual deductible is met).  Patient agreed to services and consent obtained.  Patient Care Team: GWendie Agreste MD as PCP - General (Family Medicine) SLuz Brazenas Physician Assistant (Psychiatry) ZMarty Heck MD as Consulting Physician (Gastroenterology) GAdrian Prows MD as Consulting Physician (Cardiology) ERenato Shin MD as Consulting Physician (Endocrinology) WGreer Pickerel MD as Consulting Physician (General Surgery) RSuella Broad MD as Consulting Physician (Physical Medicine and Rehabilitation) [facility]ythe Clarity [facility])  Recent office visits:  07/19/21 JMerri Ray MD (PCP) - Family Medicine - Hypertension - labs were ordered. Referral placed for General Surgery. Flu vaccine administered in office. Follow up in 6 months.    04/19/21 JMerri Ray MD - Family Medicine - Fatigue - fluticasone (FLONASE) 50 MCG/ACT nasal spray prescribed. Follow up as scheduled.    Recent consult visits:  08/13/21 BLesle Chris NP - Behavioral Health - Fatigue - No medication changes. Follow up as scheduled.   07/08/21 CLevy Pupa- No notes available   06/24/21 BLesle Chris NP - Behavioral Health - Fatigue - No medication changes. Follow up as scheduled.    05/31/21 MParalee Cancel MD - Orthopedics - No notes available   05/27/21 BLesle Chris NP - Behavioral Health - Fatigue - No medication changes. Follow up as scheduled.   05/07/21 CLevy Pupa- Pain in left hip - No notes available.      Hospital visits: 08/03/21 - 08/05/21 Medication Reconciliation was completed by comparing discharge summary, patients EMR and Pharmacy list, and upon discussion with patient.   Admitted to the hospital on 08/03/21 due to  Left  hip osteoarthritis. Discharge date was 08/05/21. Discharged from [facility]?Medications Started at [facility] Discharge:?? acetaminophen (TYLENOL) aspirin celecoxib (CELEBREX) docusate sodium (COLACE) methocarbamol (ROBAXIN) polyethylene glycol (MIRALAX /  GLYCOLAX)   Medication Changes at Hospital Discharge: None noted   Medications Discontinued at Hospital Discharge: HYDROcodone-acetaminophen 10-325 MG tablet [facility])   Medications that remain the same after Hospital Discharge:??  All other medications will remain the same.     Objective:  Lab Results  Component Value Date   CREATININE 0.93 08/04/2021   BUN 14 08/04/2021   GFR 68.61 01/14/2021   GFRNONAA >60 08/04/2021   GFRAA 80 04/30/2020   NA 136 08/04/2021   K 4.0 08/04/2021   CALCIUM 8.3 (L) 08/04/2021   CO2 26 08/04/2021   GLUCOSE 201 (H) 08/04/2021    Lab Results  Component Value Date/Time   HGBA1C 5.0 07/12/2018 04:48 PM   HGBA1C 5.0 02/21/2018 04:25 PM   GFR 68.61 01/14/2021 02:43 PM    Last diabetic Eye exam: No results found for: HMDIABEYEEXA  Last diabetic Foot exam: No results found for: HMDIABFOOTEX   Lab Results  Component Value Date   CHOL 192 01/14/2021   HDL 50.70 01/14/2021   LDLCALC 112 (H) 01/14/2021   TRIG 150.0 (H) 01/14/2021   CHOLHDL 4 01/14/2021    Hepatic Function Latest Ref Rng & Units 07/21/2021 01/14/2021 04/30/2020  Total Protein 6.5 - 8.1 g/dL 7.0 7.1 6.4  Albumin 3.5 - 5.0 g/dL 4.4 4.7 4.4  AST 15 - 41 U/L 16 17 17   ALT 0 - 44 U/L 10 10 12   Alk Phosphatase 38 - 126 U/L 74 64 84  Total Bilirubin 0.3 - 1.2 mg/dL 0.5 0.3 0.2  Bilirubin, Direct 0.0 - 0.3 mg/dL - - -    Lab Results  Component Value Date/Time   TSH 1.55 01/14/2021 02:43 PM   TSH 1.160 04/30/2020 02:36 PM   FREET4 1.61 02/21/2018 04:25 PM    CBC Latest Ref Rng & Units 08/05/2021 08/04/2021 07/21/2021  WBC 4.0 - 10.5 K/uL 13.4(H) 13.0(H) 5.9  Hemoglobin 12.0 - 15.0 g/dL 10.1(L) 10.5(L) 13.7  Hematocrit 36.0 - 46.0 % 30.0(L) 31.7(L) 42.2  Platelets 150 - 400 K/uL 223 230 289    Lab Results  Component Value Date/Time   VD25OH 50.16 01/14/2021 02:43 PM   VD25OH 55.0 04/30/2020 02:36 PM   VD25OH 26.0 (L) 09/30/2019 12:52 PM    Clinical ASCVD: No  The  10-year ASCVD risk score (Arnett DK, et al., 2019) is: 36.1%   Values used to calculate the score:     Age: 26 years     Sex: Female     Is Non-Hispanic African American: No     Diabetic: No     Tobacco smoker: Yes     Systolic Blood Pressure: 269 mmHg     Is BP treated: Yes     HDL Cholesterol: 50.7 mg/dL     Total Cholesterol: 192 mg/dL    Depression screen [facility] 2/9 07/19/2021 05/27/2021 04/19/2021  Decreased Interest 2 3 2   Down, Depressed, Hopeless 2 3 2   PHQ - 2 Score 4 6 4   Altered sleeping 1 2 3   Tired, decreased energy 1 3 3   Change in appetite 0 3 1  Feeling bad or failure about yourself  1 3 2   Trouble concentrating 0 0 0  Moving slowly or fidgety/restless 0 0 0  Suicidal thoughts 0 0 -  PHQ-9 Score 7 17  13  Difficult doing work/chores - - Somewhat difficult  Some recent data might be hidden     Social History   Tobacco Use  Smoking Status Former   Packs/day: 1.00   Years: 15.00   Pack years: 15.00   Types: Cigarettes   Quit date: 06/30/1979   Years since quitting: 42.2  Smokeless Tobacco Never   BP Readings from Last 3 Encounters:  08/05/21 (!) 181/73  07/21/21 (!) 142/82  07/19/21 138/66   Pulse Readings from Last 3 Encounters:  08/05/21 99  07/21/21 96  07/19/21 78   Wt Readings from Last 3 Encounters:  08/03/21 193 lb (87.5 kg)  07/21/21 193 lb (87.5 kg)  07/19/21 193 lb 9.6 oz (87.8 kg)   BMI Readings from Last 3 Encounters:  08/03/21 32.62 kg/m  07/21/21 32.62 kg/m  07/19/21 33.23 kg/m    Assessment/Interventions: Review of patient past medical history, allergies, medications, health status, including review of consultants reports, laboratory and other test data, was performed as part of comprehensive evaluation and provision of chronic care management services.   SDOH:  (Social Determinants of Health) assessments and interventions performed: Yes  Financial Resource Strain: Not on file    SDOH Screenings   Alcohol Screen: Not on  file  Depression (PHQ2-9): Medium Risk   PHQ-2 Score: 7  Financial Resource Strain: Not on file  Food Insecurity: Not on file  Housing: Not on file  Physical Activity: Not on file  Social Connections: Not on file  Stress: Not on file  Tobacco Use: Medium Risk   Smoking Tobacco Use: Former   Smokeless Tobacco Use: Never   Passive Exposure: Not on file  Transportation Needs: Not on file    [place]  No Known Allergies  Medications Reviewed Today     Reviewed by Edythe Clarity, [facility] (Pharmacist) on 09/09/21 at 0855  Med List Status: <None>   Medication Order Taking? Sig Documenting Provider Last Dose Status Informant  acetaminophen (TYLENOL) 325 MG tablet [id number]  Take 3 tablets (975 mg total) by mouth every 6 (six) hours as needed. Irving Copas, PA-C  Active   ALPRAZolam Duanne Moron) 1 MG tablet [id number] Yes TAKE 1 TABLET BY MOUTH TWICE A DAY AS NEEDED FOR ANXIETY Cottle, Billey Co., MD Taking Active   amLODipine (NORVASC) 5 MG tablet [id number] Yes Take 1 tablet (5 mg total) by mouth daily. Wendie Agreste, MD Taking Active   celecoxib (CELEBREX) 200 MG capsule [id number] Yes Take 1 capsule (200 mg total) by mouth 2 (two) times daily. Irving Copas, PA-C Taking Active   Cholecalciferol (VITAMIN D-3) 5000 units TABS [id number] Yes Take 5,000 Units daily by mouth. In addition to the rx high dose once weekly vitamin D of 50Ku. Shawnee Knapp, MD Taking Active   cyanocobalamin ((VITAMIN B-12)) injection 1,000 mcg [id number]   Shawnee Knapp, MD  Active            Med Note Blanch Media, CYNTHIA A   Thu Aug 08, 2019 11:21 AM) Not taking  dextroamphetamine (DEXTROSTAT) 10 MG tablet [id number]  Take 1 tablet (10 mg total) by mouth in the morning. Lesle Chris A, NP  Expired 08/03/21 2359 Self  docusate sodium (COLACE) 100 MG capsule [id number] Yes Take 1 capsule (100 mg total) by mouth 2 (two) times daily. Irving Copas, PA-C Taking Active   fluticasone (FLONASE) 50 MCG/ACT nasal spray  [id number]  Place 2 sprays into both nostrils daily. Wendie Agreste, MD  Active Self  hydrOXYzine (ATARAX/VISTARIL) 25 MG tablet [id number] Yes Take 1 tablet (25 mg total) by mouth every 8 (eight) hours as needed. Addison Lank, PA-C Taking Active Self  lamoTRIgine (LAMICTAL) 200 MG tablet [id number] Yes Take 1 tablet (200 mg total) by mouth 2 (two) times daily. Elwanda Brooklyn, NP Taking Active Self  levothyroxine (SYNTHROID) 100 MCG tablet [id number] Yes TAKE 1 TABLET BY MOUTH DAILY BEFORE BREAKFAST. Wendie Agreste, MD Taking Active   Lurasidone HCl (LATUDA) 60 MG TABS [id number] Yes Take 1 tablet (60 mg total) by mouth daily. Elwanda Brooklyn, NP Taking Active Self  methocarbamol (ROBAXIN) 500 MG tablet [id number] Yes Take 1 tablet (500 mg total) by mouth every 6 (six) hours as needed for muscle spasms. Irving Copas, PA-C Taking Active   omeprazole (PRILOSEC) 40 MG capsule [id number] Yes Take 1 capsule (40 mg total) by mouth 2 (two) times daily.  Patient taking differently: Take 40 mg by mouth daily.   Wendie Agreste, MD Taking Active   polyethylene glycol (MIRALAX / GLYCOLAX) 17 g packet [id number] Yes Take 17 g by mouth daily as needed for mild constipation. Irving Copas, PA-C Taking Active   pravastatin (PRAVACHOL) 40 MG tablet [id number] Yes Take 1 tablet (40 mg total) by mouth daily. Wendie Agreste, MD Taking Active Self  trazodone (DESYREL) 300 MG tablet [id number] Yes Take 1.5 tablets (450 mg total) by mouth at bedtime.  Patient taking differently: Take 300 mg by mouth at bedtime.   Addison Lank, PA-C Taking Active   Vitamin D, Ergocalciferol, (DRISDOL) 1.25 MG (50000 UNIT) CAPS capsule [id number] Yes Take 1 capsule (50,000 Units total) by mouth every 7 (seven) days. Sunday Wendie Agreste, MD Taking Active Self            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   Abnormal liver function tests 04/19/2021    Degeneration of lumbar intervertebral disc 05/25/2020   Pain of left hip joint 03/26/2020   Constipation 05/10/2019   Change in bowel habit 05/10/2019   Dysphagia 05/10/2019   Tremor 07/12/2018   Prediabetes 07/12/2018   Polypharmacy 07/12/2018   Unintentional weight loss of more than 10 pounds 07/12/2018   OSA (obstructive sleep apnea) 06/02/2018   Fatty liver 04/18/2017   GERD (gastroesophageal reflux disease) 10/28/2016   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   Chronic throat clearing 06/24/2016   S/P partial thyroidectomy 06/16/2016   Multinodular goiter 06/02/2016   Thyroid nodule 02/08/2016   Hx of hepatitis C 05/19/2015   Vitamin D deficiency 05/19/2015   Spinal stenosis of lumbar region 05/19/2015   Hyperlipidemia 05/19/2015   Bipolar disorder ([place]) 05/19/2015   Essential hypertension 09/26/2013   Undiagnosed cardiac murmurs 09/26/2013    Immunization History  Administered Date(s) Administered   Fluad Quad(high Dose 65+) 05/23/2019, 07/13/2020, 07/19/2021   Influenza, High Dose Seasonal PF 04/27/2017, 05/15/2018   Influenza,inj,Quad PF,6+ Mos 05/19/2015, 06/17/2016   PFIZER(Purple Top)SARS-COV-2 Vaccination 11/07/2019, 11/29/2019, 06/26/2020   Pneumococcal Conjugate-13 07/12/2018   Pneumococcal Polysaccharide-23 06/17/2016   Tdap 01/29/2017    Conditions to be addressed/monitored:  HTN, GERD, HLD, Bipolar, Pre-DM, Hypothyroidism  Care Plan : General Pharmacy (Adult)  Updates made by Edythe Clarity, RPH since 09/09/2021 12:00 AM     Problem: HTN, GERD, HLD, Bipolar, Pre-DM, Hypothyroidism   Priority: High  Onset Date: 09/09/2021     Long-Range Goal: Patient-Specific Goal   Start Date: 09/09/2021  Expected End Date: 03/09/2022  This Visit's Progress: On track  Priority: High  Note:   Current Barriers:  Unable to independently afford treatment regimen Unable to achieve control of BP   Pharmacist Clinical Goal(s):  Patient will verbalize ability to afford  treatment regimen achieve control of BP as evidenced by monitoring adhere to plan to optimize therapeutic regimen for LDL as evidenced by report of adherence to recommended medication management changes through collaboration with PharmD and provider.   Interventions: 1:1 collaboration with Wendie Agreste, MD regarding development and update of comprehensive plan of care as evidenced by provider attestation and co-signature Inter-disciplinary care team collaboration (see longitudinal plan of care) Comprehensive medication review performed; medication list updated in electronic medical record  Hypertension (BP goal <130/80) -Uncontrolled -Current treatment: Amlodipine 36m daily Appropriate, Query effective -Medications previously tried: candesartan, HCTZ, spironolactone  -Current home readings: no logs today, reports that she has seen some systolic readings in the 1419F diastolic readings in the 879Knormally -Current dietary habits: see DM -Current exercise habits: minimal, recovering from hip replacement -Denies hypotensive/hypertensive symptoms -Educated on BP goals and benefits of medications for prevention of heart attack, stroke and kidney damage; Daily salt intake goal < 2300 mg; Symptoms of hypotension and importance of maintaining adequate hydration; -Counseled to monitor BP at home daily for the next two weeks, document, and provide log at future appointments -Recommended to continue current medication Will have CMA follow up on BP in two weeks - will make appropriate adjustments at this time  Hyperlipidemia: (LDL goal < 70) -Not ideally controlled -Current treatment: Pravastatin 455mdaily - Query Appropriate -Medications previously tried: none noted  -Current dietary patterns: see DM -Current exercise habits: minimal -Educated on Cholesterol goals;  Benefits of statin for ASCVD risk reduction; Importance of limiting foods high in cholesterol; Exercise goal of 150  minutes per week; -Recommended to continue current medication Patient with ASCVD risk score of 36.1% - high risk Her most recent LDL is elevated at 112, she is having no adverse effects to statins Would qualify for high intensity statin such as Lipitor 4050maily Recommend recheck LDL at next OV - if still elevated consider switch to above  Pre-Diabetes (A1c goal <6.5%) -Controlled -Current medications: None -Medications previously tried: none noted  -Current home glucose readings fasting glucose: not checking post prandial glucose: not checking -Denies hypoglycemic/hyperglycemic symptoms -Current meal patterns:  breakfast: tangerine  lunch: frozen sandwich  dinner: frozen meal, sometimes home cooked meal from a neighbor snacks:  drinks: water, coffee -Current exercise: minimal -Educated on A1c and blood sugar goals; Exercise goal of 150 minutes per week; Benefits of weight loss; High salt content of frozen ready meals -Counseled to check feet daily and get yearly eye exams -Recommended to continue current medication Work on eliminating her frozen meals from diet  Bipolar (Goal: Minimize symptoms) -Controlled - followed by psychology -Current treatment: Latuda 50m26mily Appropriate, Effective, Safe, Query accessible Lamotrigine 200mg53m Appropriate, Effective, Safe, Accessible -Medications previously tried/failed: Cymbalta, Seroquel -PHQ9:  PHQ9 SCORE ONLY 07/19/2021 05/27/2021 04/19/2021  PHQ-9 Total Score 7 17 13   -Educated on Benefits of medication for symptom control Possibility for high copay on Latuda -Recommended to continue current medication Assessed patient finances. Worried about Latuda copay - will determine at first fill this year and then address Recommend continue current meds - will help with copay assistance if possible Has follow up with psychology soon  GERD (Goal: Has follow) -Controlled -Current treatment  Omeprazole 20mg 73my  Appropriate,  Effective, Query Safe -Medications previously tried: none noted  -Patient mentions they sometimes only use once daily Discussed appropriate timing of medication She does have symptoms if she does not take -Could consider step down based on length of therapy, will continue to discuss at further visits  Hypothyroidism (Goal: Maintain TSH) -Controlled -Current treatment  Levothyroxine 138mg daily Appropriate, Effective, Safe, Accessible -Medications previously tried: none noted -Most recent TSH is normal - takes med at appropriate time -Recommended to continue current medication  Patient Goals/Self-Care Activities Patient will:  - check blood pressure daily, document, and provide at future appointments target a minimum of 150 minutes of moderate intensity exercise weekly engage in dietary modifications by trying to cut back on salt content in diet from frozen foods  Follow Up Plan: The care management team will reach out to the patient again over the next 120 days.        Medication Assistance:  Possible help needed with Latuda  Compliance/Adherence/Medication fill history: Care Gaps: Cologuard  Star-Rating Drugs: Pravastatin 412m10/14/22 90ds  [facility]  [id number] [place] [id number]: [phone]: [phone] ([place]one: [phone]: [phone] pill box? Yes Pt endorses 100% compliance  We discussed: Benefits of medication synchronization, packaging and delivery as well as enhanced pharmacist oversight with [facility]. Patient decided to: Utilize [facility] for medication synchronization, packaging and delivery Verbal consent obtained for [facility] enhanced pharmacy services (medication synchronization, adherence  packaging, delivery coordination). A medication sync plan was created to allow patient to get all medications delivered once every 30 to 90 days per patient preference. Patient understands they have freedom to choose pharmacy and clinical pharmacist will coordinate care between all prescribers and [facility].  Care Plan and Follow Up Patient Decision:  Patient agrees to Care Plan and Follow-up.  Plan: The care management team will reach out to the patient again over the next 120 days.  ChrBeverly MilchharmD Clinical Pharmacist  [phone]

## 2021-09-07 NOTE — Progress Notes (Signed)
/   Chronic Care Management Pharmacy Assistant   Name: HAYLIN CAMILLI  MRN: [id number] DOB: 07/06/1951  Becky Lawson is an 71 y.o. year old female who presents for h initial CCM visit with the clinical pharmacist.  Reason for Encounter: Chart Prep for Initial visit with CPP on 09/08/20 @ 4:00pm in person     Recent office visits:  07/19/21 Merri Ray, MD (PCP) - Family Medicine - Hypertension - labs were ordered. Referral placed for General Surgery. Flu vaccine administered in office. Follow up in 6 months.   04/19/21 Merri Ray, MD - Family Medicine - Fatigue - fluticasone (FLONASE) 50 MCG/ACT nasal spray prescribed. Follow up as scheduled.   Recent consult visits:  08/13/21 Lesle Chris, NP - Behavioral Health - Fatigue - No medication changes. Follow up as scheduled.  07/08/21 Levy Pupa - No notes available  06/24/21 Lesle Chris, NP - Behavioral Health - Fatigue - No medication changes. Follow up as scheduled.   05/31/21 Paralee Cancel, MD - Orthopedics - No notes available  05/27/21 Lesle Chris, NP - Behavioral Health - Fatigue - No medication changes. Follow up as scheduled.  05/07/21 Levy Pupa - Pain in left hip - No notes available.    Hospital visits: 08/03/21 - 08/05/21 Medication Reconciliation was completed by comparing discharge summary, patients EMR and Pharmacy list, and upon discussion with patient.  Admitted to the hospital on 08/03/21 due to Left  hip osteoarthritis. Discharge date was 08/05/21. Discharged from [place]?Medications Started at [facility] Discharge:?? acetaminophen (TYLENOL) aspirin celecoxib (CELEBREX) docusate sodium (COLACE) methocarbamol (ROBAXIN) polyethylene glycol (MIRALAX / GLYCOLAX)  Medication Changes at Hospital Discharge: None noted  Medications Discontinued at Hospital Discharge: HYDROcodone-acetaminophen 10-325 MG tablet [facility])  Medications that remain the same after Hospital Discharge:??  All other  medications will remain the same.    Medications: Outpatient Encounter Medications as of 09/07/2021  Medication Sig   acetaminophen (TYLENOL) 325 MG tablet Take 3 tablets (975 mg total) by mouth every 6 (six) hours as needed.   ALPRAZolam (XANAX) 1 MG tablet TAKE 1 TABLET BY MOUTH TWICE A DAY AS NEEDED FOR ANXIETY   amLODipine (NORVASC) 5 MG tablet Take 1 tablet (5 mg total) by mouth daily.   celecoxib (CELEBREX) 200 MG capsule Take 1 capsule (200 mg total) by mouth 2 (two) times daily.   Cholecalciferol (VITAMIN D-3) 5000 units TABS Take 5,000 Units daily by mouth. In addition to the rx high dose once weekly vitamin D of 50Ku.   dextroamphetamine (DEXTROSTAT) 10 MG tablet Take 1 tablet (10 mg total) by mouth in the morning.   docusate sodium (COLACE) 100 MG capsule Take 1 capsule (100 mg total) by mouth 2 (two) times daily.   fluticasone (FLONASE) 50 MCG/ACT nasal spray Place 2 sprays into both nostrils daily.   hydrOXYzine (ATARAX/VISTARIL) 25 MG tablet Take 1 tablet (25 mg total) by mouth every 8 (eight) hours as needed.   lamoTRIgine (LAMICTAL) 200 MG tablet Take 1 tablet (200 mg total) by mouth 2 (two) times daily.   levothyroxine (SYNTHROID) 100 MCG tablet TAKE 1 TABLET BY MOUTH DAILY BEFORE BREAKFAST.   Lurasidone HCl (LATUDA) 60 MG TABS Take 1 tablet (60 mg total) by mouth daily.   methocarbamol (ROBAXIN) 500 MG tablet Take 1 tablet (500 mg total) by mouth every 6 (six) hours as needed for muscle spasms.   omeprazole (PRILOSEC) 40 MG capsule Take 1 capsule (40 mg total) by mouth 2 (two) times  daily. (Patient taking differently: Take 40 mg by mouth daily.)   polyethylene glycol (MIRALAX / GLYCOLAX) 17 g packet Take 17 g by mouth daily as needed for mild constipation.   pravastatin (PRAVACHOL) 40 MG tablet Take 1 tablet (40 mg total) by mouth daily.   trazodone (DESYREL) 300 MG tablet Take 1.5 tablets (450 mg total) by mouth at bedtime. (Patient taking differently: Take 300 mg by mouth at  bedtime.)   Vitamin D, Ergocalciferol, (DRISDOL) 1.25 MG (50000 UNIT) CAPS capsule Take 1 capsule (50,000 Units total) by mouth every 7 (seven) days. Sunday   Facility-Administered Encounter Medications as of 09/07/2021  Medication Note   cyanocobalamin ((VITAMIN B-12)) injection 1,000 mcg 08/08/2019: Not taking    Have you seen any other providers since your last visit? no  Any changes in your medications or health? Yes. Patient just has hip surgery in November.    Any side effects from any medications? no  Do you have an symptoms or problems not managed by your medications? no  Any concerns about your health right now? no  Has your provider asked that you check blood pressure, blood sugar, or follow special diet at home? Yes She does check blood pressure occasionally at home.   Do you get any type of exercise on a regular basis? No. Patient is recovering from recent hip surgery.   Can you think of a goal you would like to reach for your health? She would like to try and wean off some medications and improve her fatigue level.   Do you have any problems getting your medications? no  Is there anything that you would like to discuss during the appointment? Patient did not have anything   Please bring medications and supplements to appointment. Patient confirmed appointment date and time.    Care Gaps  AWV: unknown  Colonoscopy: unknown  DM Eye Exam: N/A DM Foot Exam: N/A Microalbumin: N/A HbgAIC: N/A DEXA: done 09/17/15 Mammogram: done 03/25/21   Star Rating Drugs: pravastatin (PRAVACHOL) 40 MG tablet - last filled 06/18/21 90 days   Future Appointments  Date Time Provider [place]  09/08/2021  4:00 PM [facility] CCM PHARMACIST [facility]  09/21/2021  3:00 PM Elwanda [place], NP [facility] None  01/24/2022  2:40 PM Wendie Agreste, MD [facility] [place], [facility] Clinical Pharmacist Assistant  [phone]

## 2021-09-08 ENCOUNTER — Ambulatory Visit (INDEPENDENT_AMBULATORY_CARE_PROVIDER_SITE_OTHER): Payer: PPO | Admitting: Pharmacist

## 2021-09-08 DIAGNOSIS — E785 Hyperlipidemia, unspecified: Secondary | ICD-10-CM

## 2021-09-08 DIAGNOSIS — R7303 Prediabetes: Secondary | ICD-10-CM

## 2021-09-08 DIAGNOSIS — F319 Bipolar disorder, unspecified: Secondary | ICD-10-CM

## 2021-09-08 DIAGNOSIS — I10 Essential (primary) hypertension: Secondary | ICD-10-CM

## 2021-09-09 NOTE — Patient Instructions (Addendum)
Visit Information   Goals Addressed             This Visit's Progress    Track and Manage My Blood Pressure-Hypertension       Timeframe:  Long-Range Goal Priority:  High Start Date: 09/09/21                            Expected End Date: 03/09/22                      Follow Up Date 12/08/21    - check blood pressure 3 times per week - choose a place to take my blood pressure (home, clinic or office, retail store) - write blood pressure results in a log or diary    Why is this important?   You won't feel high blood pressure, but it can still hurt your blood vessels.  High blood pressure can cause heart or kidney problems. It can also cause a stroke.  Making lifestyle changes like losing a little weight or eating less salt will help.  Checking your blood pressure at home and at different times of the day can help to control blood pressure.  If the doctor prescribes medicine remember to take it the way the doctor ordered.  Call the office if you cannot afford the medicine or if there are questions about it.     Notes:        Patient Care Plan: General Pharmacy (Adult)     Problem Identified: HTN, GERD, HLD, Bipolar, Pre-DM, Hypothyroidism   Priority: High  Onset Date: 09/09/2021     Long-Range Goal: Patient-Specific Goal   Start Date: 09/09/2021  Expected End Date: 03/09/2022  This Visit's Progress: On track  Priority: High  Note:   Current Barriers:  Unable to independently afford treatment regimen Unable to achieve control of BP   Pharmacist Clinical Goal(s):  Patient will verbalize ability to afford treatment regimen achieve control of BP as evidenced by monitoring adhere to plan to optimize therapeutic regimen for LDL as evidenced by report of adherence to recommended medication management changes through collaboration with PharmD and provider.   Interventions: 1:1 collaboration with Wendie Agreste, MD regarding development and update of comprehensive plan of  care as evidenced by provider attestation and co-signature Inter-disciplinary care team collaboration (see longitudinal plan of care) Comprehensive medication review performed; medication list updated in electronic medical record  Hypertension (BP goal <130/80) -Uncontrolled -Current treatment: Amlodipine 5mg  daily Appropriate, Query effective -Medications previously tried: candesartan, HCTZ, spironolactone  -Current home readings: no logs today, reports that she has seen some systolic readings in the 144R, diastolic readings in the 15Q normally -Current dietary habits: see DM -Current exercise habits: minimal, recovering from hip replacement -Denies hypotensive/hypertensive symptoms -Educated on BP goals and benefits of medications for prevention of heart attack, stroke and kidney damage; Daily salt intake goal < 2300 mg; Symptoms of hypotension and importance of maintaining adequate hydration; -Counseled to monitor BP at home daily for the next two weeks, document, and provide log at future appointments -Recommended to continue current medication Will have CMA follow up on BP in two weeks - will make appropriate adjustments at this time  Hyperlipidemia: (LDL goal < 70) -Not ideally controlled -Current treatment: Pravastatin 40mg  daily - Query Appropriate -Medications previously tried: none noted  -Current dietary patterns: see DM -Current exercise habits: minimal -Educated on Cholesterol goals;  Benefits of statin for ASCVD  risk reduction; Importance of limiting foods high in cholesterol; Exercise goal of 150 minutes per week; -Recommended to continue current medication Patient with ASCVD risk score of 36.1% - high risk Her most recent LDL is elevated at 112, she is having no adverse effects to statins Would qualify for high intensity statin such as Lipitor 40mg  daily Recommend recheck LDL at next OV - if still elevated consider switch to above  Pre-Diabetes (A1c goal  <6.5%) -Controlled -Current medications: None -Medications previously tried: none noted  -Current home glucose readings fasting glucose: not checking post prandial glucose: not checking -Denies hypoglycemic/hyperglycemic symptoms -Current meal patterns:  breakfast: tangerine  lunch: frozen sandwich  dinner: frozen meal, sometimes home cooked meal from a neighbor snacks:  drinks: water, coffee -Current exercise: minimal -Educated on A1c and blood sugar goals; Exercise goal of 150 minutes per week; Benefits of weight loss; High salt content of frozen ready meals -Counseled to check feet daily and get yearly eye exams -Recommended to continue current medication Work on eliminating her frozen meals from diet  Bipolar (Goal: Minimize symptoms) -Controlled - followed by psychology -Current treatment: Latuda 60mg  daily Appropriate, Effective, Safe, Query accessible Lamotrigine 200mg  Bid Appropriate, Effective, Safe, Accessible -Medications previously tried/failed: Cymbalta, Seroquel -PHQ9:  PHQ9 SCORE ONLY 07/19/2021 05/27/2021 04/19/2021  PHQ-9 Total Score 7 17 13   -Educated on Benefits of medication for symptom control Possibility for high copay on Latuda -Recommended to continue current medication Assessed patient finances. Worried about Latuda copay - will determine at first fill this year and then address Recommend continue current meds - will help with copay assistance if possible Has follow up with psychology soon  GERD (Goal: Has follow) -Controlled -Current treatment  Omeprazole 20mg  daily Appropriate, Effective, Query Safe -Medications previously tried: none noted  -Patient mentions they sometimes only use once daily Discussed appropriate timing of medication She does have symptoms if she does not take -Could consider step down based on length of therapy, will continue to discuss at further visits  Hypothyroidism (Goal: Maintain TSH) -Controlled -Current treatment   Levothyroxine 18mcg daily Appropriate, Effective, Safe, Accessible -Medications previously tried: none noted -Most recent TSH is normal - takes med at appropriate time -Recommended to continue current medication  Patient Goals/Self-Care Activities Patient will:  - check blood pressure daily, document, and provide at future appointments target a minimum of 150 minutes of moderate intensity exercise weekly engage in dietary modifications by trying to cut back on salt content in diet from frozen foods  Follow Up Plan: The care management team will reach out to the patient again over the next 120 days.       Ms. Umphlett was given information about Chronic Care Management services today including:  CCM service includes personalized support from designated clinical staff supervised by her physician, including individualized plan of care and coordination with other care providers 24/7 contact phone numbers for assistance for urgent and routine care needs. Standard insurance, coinsurance, copays and deductibles apply for chronic care management only during months in which we provide at least 20 minutes of these services. Most insurances cover these services at 100%, however patients may be responsible for any copay, coinsurance and/or deductible if applicable. This service may help you avoid the need for more expensive face-to-face services. Only one practitioner may furnish and bill the service in a calendar month. The patient may stop CCM services at any time (effective at the end of the month) by phone call to the office staff.  Patient agreed  to services and verbal consent obtained.   The patient verbalized understanding of instructions, educational materials, and care plan provided today and agreed to receive a mailed copy of patient instructions, educational materials, and care plan.  Telephone follow up appointment with pharmacy team member scheduled for: 4 months  Edythe Clarity, [place], PharmD Clinical Pharmacist  [facility] [phone]

## 2021-09-15 ENCOUNTER — Other Ambulatory Visit: Payer: Self-pay | Admitting: Physician Assistant

## 2021-09-21 ENCOUNTER — Ambulatory Visit (INDEPENDENT_AMBULATORY_CARE_PROVIDER_SITE_OTHER): Payer: PPO | Admitting: Behavioral Health

## 2021-09-21 ENCOUNTER — Encounter: Payer: Self-pay | Admitting: Behavioral Health

## 2021-09-21 DIAGNOSIS — F99 Mental disorder, not otherwise specified: Secondary | ICD-10-CM

## 2021-09-21 DIAGNOSIS — F411 Generalized anxiety disorder: Secondary | ICD-10-CM | POA: Diagnosis not present

## 2021-09-21 DIAGNOSIS — F3175 Bipolar disorder, in partial remission, most recent episode depressed: Secondary | ICD-10-CM

## 2021-09-21 DIAGNOSIS — R5383 Other fatigue: Secondary | ICD-10-CM | POA: Diagnosis not present

## 2021-09-21 DIAGNOSIS — F32A Depression, unspecified: Secondary | ICD-10-CM | POA: Diagnosis not present

## 2021-09-21 DIAGNOSIS — F5105 Insomnia due to other mental disorder: Secondary | ICD-10-CM

## 2021-09-21 MED ORDER — DEXTROAMPHETAMINE SULFATE 10 MG PO TABS: 10.0000 mg | ORAL_TABLET | Freq: Every morning | ORAL | 0 refills | Status: DC

## 2021-09-21 NOTE — Progress Notes (Signed)
Becky Lawson [id number] 1951-05-15 71 y.o.  Virtual Visit via Telephone Note  I connected with pt on 09/21/21 at  3:00 PM EST by telephone and verified that I am speaking with the correct person using two identifiers.   I discussed the limitations, risks, security and privacy concerns of performing an evaluation and management service by telephone and the availability of in person appointments. I also discussed with the patient that there may be a patient responsible charge related to this service. The patient expressed understanding and agreed to proceed.   I discussed the assessment and treatment plan with the patient. The patient was provided an opportunity to ask questions and all were answered. The patient agreed with the plan and demonstrated an understanding of the instructions.   The patient was advised to call back or seek an in-person evaluation if the symptoms worsen or if the condition fails to improve as anticipated.  I provided 30 minutes of non-face-to-face time during this encounter.  The patient was located at home.  The provider was located at [place].   Becky Brooklyn, NP   Subjective:   Patient ID:  Becky Lawson is a 71 y.o. (DOB 03-Jun-1951) female.  Chief Complaint:  Chief Complaint  Patient presents with   Depression   Follow-up   Medication Refill   Medication Problem   Procedure   Patient Education   Anxiety    HPI Becky Lawson presents for follow-up and medication management. 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 Her pain has subsided but she is still taking medication. She does not want to consider any medication changes until she has healed and off additional medications. She is very happy with her Becky Lawson, She says that after trying many different medications, it is the only thing that has really helped her long term. She says that she has had to stop and switch several time due to insurance changes and it  really causes problems. She is now worried that she will not be able to get her medications and we have no more samples after this visit due to [name] going generic in February. She is going to call Latuda this week and also see how much it will be.  Says her anxiety today is 3/10 and depression is 3/10. She is sleeping 7 hours per night. She agrees to follow up in 1 months when she is healed and can come to office. She denies mania, no psychosis, NO SI/HI.  Past medications for mental health diagnoses include: Lithium, Seroquel, Effexor, Prozac, Latuda, Abilify, Dexedrine, Cymbalta caused weight gain.  Modafinil.   Review of Systems:  Review of Systems  Constitutional: Negative.   Cardiovascular:  Negative for palpitations.  Allergic/Immunologic: Negative.   Neurological:  Negative for tremors and weakness.  Psychiatric/Behavioral:  Positive for dysphoric mood. The patient is nervous/anxious.    Medications: I have reviewed the patient's current medications.  Current Outpatient Medications  Medication Sig Dispense Refill   trazodone (DESYREL) 300 MG tablet TAKE 1.5 TABLETS (450 MG TOTAL) BY MOUTH AT BEDTIME. 45 tablet 0   acetaminophen (TYLENOL) 325 MG tablet Take 3 tablets (975 mg total) by mouth every 6 (six) hours as needed.     ALPRAZolam (XANAX) 1 MG tablet TAKE 1 TABLET BY MOUTH TWICE A DAY AS NEEDED FOR ANXIETY 60 tablet 0   amLODipine (NORVASC) 5 MG tablet Take 1 tablet (5 mg total) by mouth daily. 90 tablet 1   celecoxib (  CELEBREX) 200 MG capsule Take 1 capsule (200 mg total) by mouth 2 (two) times daily. 60 capsule 0   Cholecalciferol (VITAMIN D-3) 5000 units TABS Take 5,000 Units daily by mouth. In addition to the rx high dose once weekly vitamin D of 50Ku. 30 tablet    dextroamphetamine (DEXTROSTAT) 10 MG tablet Take 1 tablet (10 mg total) by mouth in the morning. 30 tablet 0   docusate sodium (COLACE) 100 MG capsule Take 1 capsule (100 mg total) by mouth 2 (two) times daily. 10  capsule 0   fluticasone (FLONASE) 50 MCG/ACT nasal spray Place 2 sprays into both nostrils daily. 16 g 6   hydrOXYzine (ATARAX/VISTARIL) 25 MG tablet Take 1 tablet (25 mg total) by mouth every 8 (eight) hours as needed. 60 tablet 5   lamoTRIgine (LAMICTAL) 200 MG tablet Take 1 tablet (200 mg total) by mouth 2 (two) times daily. 180 tablet 1   levothyroxine (SYNTHROID) 100 MCG tablet TAKE 1 TABLET BY MOUTH DAILY BEFORE BREAKFAST. 90 tablet 1   Lurasidone HCl (LATUDA) 60 MG TABS Take 1 tablet (60 mg total) by mouth daily. 30 tablet 2   methocarbamol (ROBAXIN) 500 MG tablet Take 1 tablet (500 mg total) by mouth every 6 (six) hours as needed for muscle spasms. 40 tablet 0   omeprazole (PRILOSEC) 40 MG capsule Take 1 capsule (40 mg total) by mouth 2 (two) times daily. (Patient taking differently: Take 40 mg by mouth daily.) 180 capsule 1   polyethylene glycol (MIRALAX / GLYCOLAX) 17 g packet Take 17 g by mouth daily as needed for mild constipation. 14 each 0   pravastatin (PRAVACHOL) 40 MG tablet Take 1 tablet (40 mg total) by mouth daily. 90 tablet 3   Vitamin D, Ergocalciferol, (DRISDOL) 1.25 MG (50000 UNIT) CAPS capsule Take 1 capsule (50,000 Units total) by mouth every 7 (seven) days. Sunday 12 capsule 3   Current Facility-Administered Medications  Medication Dose Route Frequency Provider Last Rate Last Admin   cyanocobalamin ((VITAMIN B-12)) injection 1,000 mcg  1,000 mcg Intramuscular Q30 days Shawnee Knapp, MD   1,000 mcg at 07/12/18 1652    Medication Side Effects: None  Allergies: No Known Allergies  Past Medical History:  Diagnosis Date   Arthritis    Benign cyst of right kidney 2014   2.7 cm right kidney cyst seen on imaging 2014 but was c/o right flank pain with new persistent proteinuria (fortunately hematuria had resolved) so repeat US 11/2016 showed right kidney still with 2.6 cm simple cyst and otherwise nml.   Bipolar 1 disorder ([place])    Cancer ([place])    [employer] 06/15/2020    Chronic kidney disease    Depression    Depression    [employer] 06/15/2020   GERD (gastroesophageal reflux disease)    Heart murmur    Hepatitis C    resolved completely after treatment in 2014, genotype 1b, followed at [facility]   Hyperlipidemia    Hypertension    Hypothyroidism    Jaundice 11/01/2012   Pruritic disorder 02/13/2013   Sleep apnea    Was diagnosed approximately 20 years ago, but does not wear CPAP patient stated "I gave it back.Marland KitchenMarland KitchenI couldn't wear that thing it was horrible"   Spinal stenosis    Thyroid disease    [employer] 06/15/2020    Family History  Problem Relation Age of Onset   Diabetes Mother    Heart disease Mother    Hyperlipidemia Mother    Hypertension Mother  Stroke Mother    Hypertension Father    Parkinson's disease Father    Heart disease Father    Hyperlipidemia Father    Hemachromatosis Daughter    Thyroid disease Neg Hx    Colon cancer Neg Hx    Esophageal cancer Neg Hx    Inflammatory bowel disease Neg Hx    Liver disease Neg Hx    Pancreatic cancer Neg Hx    Rectal cancer Neg Hx    Stomach cancer Neg Hx     Social History   Socioeconomic History   Marital status: Divorced    Spouse name: Not on file   Number of children: Not on file   Years of education: Not on file   Highest education level: Not on file  Occupational History   Occupation: Personal Caregiver  Tobacco Use   Smoking status: Former    Packs/day: 1.00    Years: 15.00    Pack years: 15.00    Types: Cigarettes    Quit date: 06/30/1979    Years since quitting: 42.2   Smokeless tobacco: Never  Vaping Use   Vaping Use: Never used  Substance and Sexual Activity   Alcohol use: Not Currently    Alcohol/week: 0.0 - 1.0 standard drinks   Drug use: No   Sexual activity: Yes    Birth control/protection: None  Other Topics Concern   Not on file  Social History Narrative   Divorced. Education: [employer]. Exercise: No.   Social Determinants of Health   Financial  Resource Strain: Not on file  Food Insecurity: Not on file  Transportation Needs: Not on file  Physical Activity: Not on file  Stress: Not on file  Social Connections: Not on file  Intimate Partner Violence: Not on file    Past Medical History, Surgical history, Social history, and Family history were reviewed and updated as appropriate.   Please see review of systems for further details on the patient's review from today.   Objective:   Physical Exam:  There were no vitals taken for this visit.  Physical Exam Neurological:     Mental Status: She is alert and oriented to person, place, and time.  Psychiatric:        Attention and Perception: Attention and perception normal.        Mood and Affect: Mood normal.        Speech: Speech normal.        Behavior: Behavior normal. Behavior is cooperative.        Cognition and Memory: Cognition and memory normal.        Judgment: Judgment normal.     Comments: Insight intact    Lab Review:     Component Value Date/Time   NA 136 08/04/2021 0319   NA 140 04/30/2020 1436   K 4.0 08/04/2021 0319   CL 104 08/04/2021 0319   CO2 26 08/04/2021 0319   GLUCOSE 201 (H) 08/04/2021 0319   BUN 14 08/04/2021 0319   BUN 13 04/30/2020 1436   CREATININE 0.93 08/04/2021 0319   CREATININE 0.79 07/05/2016 0825   CALCIUM 8.3 (L) 08/04/2021 0319   PROT 7.0 07/21/2021 1438   PROT 6.4 04/30/2020 1436   ALBUMIN 4.4 07/21/2021 1438   ALBUMIN 4.4 04/30/2020 1436   AST 16 07/21/2021 1438   ALT 10 07/21/2021 1438   ALKPHOS 74 07/21/2021 1438   BILITOT 0.5 07/21/2021 1438   BILITOT 0.2 04/30/2020 1436   GFRNONAA >60 08/04/2021 0319   GFRAA  80 04/30/2020 1436       Component Value Date/Time   WBC 13.4 (H) 08/05/2021 0253   RBC 3.24 (L) 08/05/2021 0253   HGB 10.1 (L) 08/05/2021 0253   HGB 14.0 04/30/2020 1436   HCT 30.0 (L) 08/05/2021 0253   HCT 42.0 04/30/2020 1436   PLT 223 08/05/2021 0253   PLT 237 04/30/2020 1436   MCV 92.6 08/05/2021  0253   MCV 95 04/30/2020 1436   MCH 31.2 08/05/2021 0253   MCHC 33.7 08/05/2021 0253   RDW 12.6 08/05/2021 0253   RDW 12.2 04/30/2020 1436   LYMPHSABS 1.4 12/05/2018 1642   MONOABS 0.5 05/24/2013 0953   EOSABS 0.0 12/05/2018 1642   BASOSABS 0.0 12/05/2018 1642    Lithium Lvl  Date Value Ref Range Status  10/04/2009 0.43 (L) 0.80 - 1.40 mEq/L Final     No results found for: PHENYTOIN, PHENOBARB, VALPROATE, CBMZ   .res Assessment: Plan:    Laterica was seen today for depression, follow-up, medication refill, medication problem, procedure, patient education and anxiety.  Diagnoses and all orders for this visit:  Generalized anxiety disorder  Fatigue, unspecified type  Melancholy  Insomnia due to other mental disorder  Bipolar disorder, in partial remission, most recent episode depressed ([place])   Greater than 50% of 30  min of non-face to face time via telephone was spent on counseling and coordination of care. Since increasing Latuda to 60 mg , her rapid cycling has improved. She has also had recent hip replacement surgery and still recovering. She agrees that no psychotropic medication changes should occur until she is off of pain medication and recovered.  Discussed xanax with her and told her we would have to come up with long term plan to wean.   PDMP was reviewed.   Continue  Latuda 60 mg daily. Continue hydroxyzine 25 mg nightly 8H as needed. Continue  Lamictal  350 mg daily. 200 mg in the am and 150 mg in the evening. Continue trazodone 300 mg, 1.5 pills nightly.   Instructed pt that she must consume 350 cal meal when taking latuda Return in 4 weeks to reassess. She would like to do one more tele-visit until she is fully ambulatory. Discussed potential benefits, risks, and side effects of stimulants with patient to include increased heart rate, palpitations, insomnia, increased anxiety, increased irritability, or decreased appetite.  Instructed patient to contact office if  experiencing any significant tolerability issues.  Discussed potential metabolic side effects associated with atypical antipsychotics, as well as potential risk for movement side effects. Advised pt to contact office if movement side effects occur.     Please see After Visit Summary for patient specific instructions.  Future Appointments  Date Time Provider [place]  01/12/2022  3:00 PM [facility] PHARMACIST [facility]  01/24/2022  2:40 PM Wendie Agreste, MD [facility]    No orders of the defined types were placed in this encounter.     -------------------------------

## 2021-09-22 ENCOUNTER — Telehealth: Payer: Self-pay | Admitting: Pharmacist

## 2021-09-22 DIAGNOSIS — Z20822 Contact with and (suspected) exposure to covid-19: Secondary | ICD-10-CM | POA: Diagnosis not present

## 2021-09-22 DIAGNOSIS — Z03818 Encounter for observation for suspected exposure to other biological agents ruled out: Secondary | ICD-10-CM | POA: Diagnosis not present

## 2021-09-22 NOTE — Progress Notes (Addendum)
° ° °  Chronic Care Management Pharmacy Assistant   Name: RUBERTA HOLCK  MRN: [id number] DOB: December 29, 1950   Reason for Encounter: CMA phone call (BP readings last 2 weeks)   Per CPP: Follow up with patient to get listing of last 2 weeks of BP readings.  Called patient and she report she only had 2 readings to report which were both in the range of  160s. She reported 163/81 (09/15/21). She just got home from getting a COVID test and has been sick. She apologized for not having more readings but has been under the weather.    Jobe Gibbon, [place] Pharmacist Assistant  [phone]  10 minutes spent in review, coordination, and documentation.  Will call back next month on BP.  Need more readings to make adjustments. Need to follow back up with her on [facility] pharmacy process.  Reviewed by: Beverly Milch, PharmD Clinical Pharmacist [phone]

## 2021-09-23 ENCOUNTER — Other Ambulatory Visit: Payer: Self-pay

## 2021-09-23 ENCOUNTER — Telehealth: Payer: Self-pay | Admitting: Behavioral Health

## 2021-09-23 DIAGNOSIS — I10 Essential (primary) hypertension: Secondary | ICD-10-CM

## 2021-09-23 DIAGNOSIS — F319 Bipolar disorder, unspecified: Secondary | ICD-10-CM

## 2021-09-23 DIAGNOSIS — E890 Postprocedural hypothyroidism: Secondary | ICD-10-CM

## 2021-09-23 DIAGNOSIS — F411 Generalized anxiety disorder: Secondary | ICD-10-CM

## 2021-09-23 DIAGNOSIS — F3175 Bipolar disorder, in partial remission, most recent episode depressed: Secondary | ICD-10-CM

## 2021-09-23 DIAGNOSIS — E785 Hyperlipidemia, unspecified: Secondary | ICD-10-CM

## 2021-09-23 DIAGNOSIS — F3162 Bipolar disorder, current episode mixed, moderate: Secondary | ICD-10-CM

## 2021-09-23 DIAGNOSIS — F32A Depression, unspecified: Secondary | ICD-10-CM

## 2021-09-23 MED ORDER — PRAVASTATIN SODIUM 40 MG PO TABS: 40.0000 mg | ORAL_TABLET | Freq: Every day | ORAL | 3 refills | Status: DC

## 2021-09-23 MED ORDER — LATUDA 60 MG PO TABS: 60.0000 mg | ORAL_TABLET | Freq: Every day | ORAL | 0 refills | Status: DC

## 2021-09-23 MED ORDER — LEVOTHYROXINE SODIUM 100 MCG PO TABS: ORAL_TABLET | ORAL | 1 refills | Status: DC

## 2021-09-23 MED ORDER — TRAZODONE HCL 300 MG PO TABS: 450.0000 mg | ORAL_TABLET | Freq: Every day | ORAL | 0 refills | Status: DC

## 2021-09-23 MED ORDER — AMLODIPINE BESYLATE 5 MG PO TABS: 5.0000 mg | ORAL_TABLET | Freq: Every day | ORAL | 1 refills | Status: DC

## 2021-09-23 MED ORDER — LAMOTRIGINE 200 MG PO TABS: 200.0000 mg | ORAL_TABLET | Freq: Two times a day (BID) | ORAL | 0 refills | Status: DC

## 2021-09-23 MED ORDER — OMEPRAZOLE 40 MG PO CPDR: 40.0000 mg | DELAYED_RELEASE_CAPSULE | Freq: Two times a day (BID) | ORAL | 1 refills | Status: DC

## 2021-09-23 NOTE — Telephone Encounter (Signed)
Rx sent 

## 2021-09-23 NOTE — Telephone Encounter (Signed)
April @ [facility] called requesting new Rx for Trazodone, Latuda and Lamictal. Was unable to transfer these Rx from previous pharmacy.

## 2021-10-04 ENCOUNTER — Telehealth (INDEPENDENT_AMBULATORY_CARE_PROVIDER_SITE_OTHER): Payer: PPO | Admitting: Family Medicine

## 2021-10-04 VITALS — Temp 101.9°F

## 2021-10-04 DIAGNOSIS — U071 COVID-19: Secondary | ICD-10-CM | POA: Diagnosis not present

## 2021-10-04 MED ORDER — MOLNUPIRAVIR EUA 200MG CAPSULE: 4.0000 | ORAL_CAPSULE | Freq: Two times a day (BID) | ORAL | 0 refills | Status: AC

## 2021-10-04 NOTE — Patient Instructions (Addendum)
Tylenol is fine for fever, mucinex for cough.  Drink plenty of fluids, rest.  Antiviral molnupiravir was sent to your pharmacy.  Start that today. Your currently on day 4, isolation can and after day 5 but she should not ask for day 6 through 10 around others.  See information below.  Hope you feel better soon and let me know if there are questions.  Return to the clinic or go to the nearest emergency room if any of your symptoms worsen or new symptoms occur.  Everyone who has presumed or confirmed COVID-19 should stay home and isolate from other people for at least 5 full days (day 0 is the first day of symptoms or the date of the day of the positive viral test for asymptomatic persons). You can end isolation after 5 full days if you are fever-free for 24 hours without the use of fever-reducing medication and your other symptoms have improved (Loss of taste and smell may persist for weeks or months after recovery and need not delay the end of isolation). You should continue to wear a well-fitting mask around others at home and in public for 5 additional days (day 6 through day 10) after the end of your 5-day isolation period. If you are unable to wear a mask when around others, you should continue to isolate for a full 10 days. Avoid people who have weakened immune systems or are more likely to get very sick from COVID-19, and nursing homes and other high-risk settings, until after at least 10 days.  If you continue to have fever or your other symptoms have not improved after 5 days of isolation, you should wait to end your isolation until you are fever-free for 24 hours without the use of fever-reducing medication and your other symptoms have improved. Continue to wear a well-fitting mask through day 10.   [email or web address]

## 2021-10-04 NOTE — Progress Notes (Signed)
Virtual Visit via audio Note  I connected with Becky Lawson on 10/04/21 at 12:05 PM by audio - patient unable to get video her device to work and verified that I am speaking with the correct person using two identifiers.  Patient location: home - by self.  My location: office - [place].    I discussed the limitations, risks, security and privacy concerns of performing an evaluation and management service by telephone and the availability of in person appointments. I also discussed with the patient that there may be a patient responsible charge related to this service. The patient expressed understanding and agreed to proceed, consent obtained  Chief complaint:  Chief Complaint  Patient presents with   Covid Positive    Pt tested positive on Thursday, sxs started Thursday, has had fever 100.9, aches, sinus congestion, cough, sore throat, chills, pt has had 3 doses COVID vaccine,     History of Present Illness: Becky Lawson is a 71 y.o. female COVID-19 infection: Initial symptoms started 09/30/2021 with fever, body aches, sinus congestion, cough, sore throat, chills.  Min cough. Chills and fever, bodyache main symptoms.  No chest pain, no dyspnea, drinking fluids. No confusion.  Positive testing: 09/30/2021. Tx: none.   COVID-19 vaccine in March 2021, primary series with booster October 2021. No recent booster. No prior covid infection.   COVID risk of complication score 4.  She is on statin (last dose 14 hrs ago).  calcium channel blocker, and on Latuda.  eGFR >60 on 08/04/21.   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   Abnormal liver function tests 04/19/2021   Degeneration of lumbar intervertebral disc 05/25/2020   Pain of left hip joint 03/26/2020   Constipation 05/10/2019   Change in bowel habit 05/10/2019   Dysphagia 05/10/2019   Tremor 07/12/2018   Prediabetes 07/12/2018   Polypharmacy 07/12/2018   Unintentional  weight loss of more than 10 pounds 07/12/2018   OSA (obstructive sleep apnea) 06/02/2018   Fatty liver 04/18/2017   GERD (gastroesophageal reflux disease) 10/28/2016   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   Chronic throat clearing 06/24/2016   S/P partial thyroidectomy 06/16/2016   Multinodular goiter 06/02/2016   Thyroid nodule 02/08/2016   Hx of hepatitis C 05/19/2015   Vitamin D deficiency 05/19/2015   Spinal stenosis of lumbar region 05/19/2015   Hyperlipidemia 05/19/2015   Bipolar disorder ([place]) 05/19/2015   Essential hypertension 09/26/2013   Undiagnosed cardiac murmurs 09/26/2013   Past Medical History:  Diagnosis Date   Arthritis    Benign cyst of right kidney 2014   2.7 cm right kidney cyst seen on imaging 2014 but was c/o right flank pain with new persistent proteinuria (fortunately hematuria had resolved) so repeat US 11/2016 showed right kidney still with 2.6 cm simple cyst and otherwise nml.   Bipolar 1 disorder ([place])    Cancer ([place])    Phreesia 06/15/2020   Chronic kidney disease    Depression    Depression    Phreesia 06/15/2020   GERD (gastroesophageal reflux disease)    Heart murmur    Hepatitis C    resolved completely after treatment in 2014, genotype 1b, followed at [facility]   Hyperlipidemia    Hypertension    Hypothyroidism    Jaundice 11/01/2012   Pruritic disorder 02/13/2013   Sleep apnea    Was diagnosed approximately 20 years ago, but does not wear CPAP patient stated "I gave it back.Marland KitchenMarland KitchenI  couldn't wear that thing it was horrible"   Spinal stenosis    Thyroid disease    Phreesia 06/15/2020   Past Surgical History:  Procedure Laterality Date   ABDOMINAL HYSTERECTOMY     COLONOSCOPY W/ POLYPECTOMY     JOINT REPLACEMENT Bilateral    knee   KNEE ARTHROSCOPY Bilateral    PARTIAL HYSTERECTOMY     THYROID LOBECTOMY Left 06/16/2016   THYROID LOBECTOMY Left 06/16/2016   Procedure: LEFT THYROID LOBECTOMY;  Surgeon: Greer Pickerel, MD;  Location: [facility];   Service: General;  Laterality: Left;   TOTAL HIP ARTHROPLASTY Left 08/03/2021   Procedure: TOTAL HIP ARTHROPLASTY ANTERIOR APPROACH;  Surgeon: Paralee Cancel, MD;  Location: [facility];  Service: Orthopedics;  Laterality: Left;   No Known Allergies Prior to Admission medications   Medication Sig Start Date End Date Taking? Authorizing Provider  ALPRAZolam Duanne Moron) 1 MG tablet TAKE 1 TABLET BY MOUTH TWICE A DAY AS NEEDED FOR ANXIETY 09/01/21   Cottle, Billey Co., MD  amLODipine (NORVASC) 5 MG tablet Take 1 tablet (5 mg total) by mouth daily. 09/23/21   Wendie Agreste, MD  celecoxib (CELEBREX) 200 MG capsule Take 1 capsule (200 mg total) by mouth 2 (two) times daily. 08/04/21   Irving Copas, PA-C  Cholecalciferol (VITAMIN D-3) 5000 units TABS Take 5,000 Units daily by mouth. In addition to the rx high dose once weekly vitamin D of 50Ku. 07/24/17   Shawnee Knapp, MD  dextroamphetamine (DEXTROSTAT) 10 MG tablet Take 1 tablet (10 mg total) by mouth in the morning. 09/21/21 10/21/21  Elwanda Brooklyn, NP  docusate sodium (COLACE) 100 MG capsule Take 1 capsule (100 mg total) by mouth 2 (two) times daily. 08/04/21   Irving Copas, PA-C  hydrOXYzine (ATARAX/VISTARIL) 25 MG tablet Take 1 tablet (25 mg total) by mouth every 8 (eight) hours as needed. 01/18/21   Addison Lank, PA-C  lamoTRIgine (LAMICTAL) 200 MG tablet Take 1 tablet (200 mg total) by mouth 2 (two) times daily. 09/23/21   Elwanda Brooklyn, NP  levothyroxine (SYNTHROID) 100 MCG tablet TAKE 1 TABLET BY MOUTH DAILY BEFORE BREAKFAST. 09/23/21   Wendie Agreste, MD  Lurasidone HCl (LATUDA) 60 MG TABS Take 1 tablet (60 mg total) by mouth daily. 09/23/21   Elwanda Brooklyn, NP  methocarbamol (ROBAXIN) 500 MG tablet Take 1 tablet (500 mg total) by mouth every 6 (six) hours as needed for muscle spasms. 08/04/21   Irving Copas, PA-C  omeprazole (PRILOSEC) 40 MG capsule Take 1 capsule (40 mg total) by mouth 2 (two) times daily. 09/23/21   Wendie Agreste, MD  polyethylene glycol (MIRALAX / GLYCOLAX) 17 g packet Take 17 g by mouth daily as needed for mild constipation. 08/04/21   Irving Copas, PA-C  pravastatin (PRAVACHOL) 40 MG tablet Take 1 tablet (40 mg total) by mouth daily. 09/23/21   Wendie Agreste, MD  trazodone (DESYREL) 300 MG tablet Take 1.5 tablets (450 mg total) by mouth at bedtime. 09/23/21   Elwanda Brooklyn, NP  Vitamin D, Ergocalciferol, (DRISDOL) 1.25 MG (50000 UNIT) CAPS capsule Take 1 capsule (50,000 Units total) by mouth every 7 (seven) days. Sunday 04/19/21   Wendie Agreste, MD   Social History   Socioeconomic History   Marital status: Divorced    Spouse name: Not on file   Number of children: Not on file   Years of education: Not on file   Highest education  level: Not on file  Occupational History   Occupation: Personal Caregiver  Tobacco Use   Smoking status: Former    Packs/day: 1.00    Years: 15.00    Pack years: 15.00    Types: Cigarettes    Quit date: 06/30/1979    Years since quitting: 42.2   Smokeless tobacco: Never  Vaping Use   Vaping Use: Never used  Substance and Sexual Activity   Alcohol use: Not Currently    Alcohol/week: 0.0 - 1.0 standard drinks   Drug use: No   Sexual activity: Yes    Birth control/protection: None  Other Topics Concern   Not on file  Social History Narrative   Divorced. Education: [employer]. Exercise: No.   Social Determinants of Health   Financial Resource Strain: Not on file  Food Insecurity: Not on file  Transportation Needs: Not on file  Physical Activity: Not on file  Stress: Not on file  Social Connections: Not on file  Intimate Partner Violence: Not on file    Observations/Objective: Speaking in full sentences, no distress, no apparent respiratory distress.  Appropriate responses, coherent responses.  Euthymic mood.  All questions were answered with understanding of plan expressed.  Assessment and Plan: COVID-19 virus infection - Plan:  molnupiravir EUA (LAGEVRIO) 200 mg CAPS capsule No red flags at this time, overall symptoms appear to be stable, mild.  Has been vaccinated with a single booster as above but has not had bivalent booster or recent booster.  Treatment options discussed including antivirals including potential side effects, risks and possibility of rebound COVID as well as treatment if that were to occur.  She would like to try antivirals, and chose molnupiravir due to medication adjustments needed with Paxlovid.  Prescription sent.  Tylenol for fever, Mucinex for cough, rest, fluids, ER/urgent care precautions given.  Masking/isolation precautions discussed per CDC with handout given  Follow Up Instructions: As needed   I discussed the assessment and treatment plan with the patient. The patient was provided an opportunity to ask questions and all were answered. The patient agreed with the plan and demonstrated an understanding of the instructions.   The patient was advised to call back or seek an in-person evaluation if the symptoms worsen or if the condition fails to improve as anticipated.  I provided 19 minutes of non-face-to-face time during this encounter.   Wendie Agreste, MD

## 2021-10-05 DIAGNOSIS — E785 Hyperlipidemia, unspecified: Secondary | ICD-10-CM | POA: Diagnosis not present

## 2021-10-05 DIAGNOSIS — I10 Essential (primary) hypertension: Secondary | ICD-10-CM

## 2021-10-07 ENCOUNTER — Telehealth: Payer: Self-pay | Admitting: Family Medicine

## 2021-10-07 DIAGNOSIS — R400 Somnolence: Secondary | ICD-10-CM

## 2021-10-07 DIAGNOSIS — R5383 Other fatigue: Secondary | ICD-10-CM

## 2021-10-07 NOTE — Telephone Encounter (Signed)
Pt called in asking for a referral to see Dr. Rexene Alberts, please advise if this can be done. Pt states that she and [name] have talked about this in the past before.

## 2021-10-07 NOTE — Telephone Encounter (Signed)
Please see referrals.  She was referred in May of last year but initially held off on visit due to co-pays.  Can we continue that referral or do we need to place new referral?

## 2021-10-07 NOTE — Telephone Encounter (Signed)
Pt has called in asking for referral to Neurology Dr Rexene Alberts. Most recent visit 10/04/21 for COVID and 07/19/21 pt states this has been previously discussed but would you like her to have another visit to discuss the referral

## 2021-10-08 NOTE — Telephone Encounter (Signed)
Referral is greater than 6 months old will need a new referral

## 2021-10-09 NOTE — Telephone Encounter (Signed)
Noted. Thanks. New referral placed.

## 2021-10-20 DIAGNOSIS — R1310 Dysphagia, unspecified: Secondary | ICD-10-CM | POA: Diagnosis not present

## 2021-10-20 DIAGNOSIS — Z9009 Acquired absence of other part of head and neck: Secondary | ICD-10-CM | POA: Diagnosis not present

## 2021-10-20 DIAGNOSIS — Z85850 Personal history of malignant neoplasm of thyroid: Secondary | ICD-10-CM | POA: Diagnosis not present

## 2021-10-20 HISTORY — DX: Dysphagia, unspecified: R13.10

## 2021-10-21 ENCOUNTER — Other Ambulatory Visit: Payer: Self-pay | Admitting: Surgery

## 2021-10-21 DIAGNOSIS — Z9009 Acquired absence of other part of head and neck: Secondary | ICD-10-CM

## 2021-10-21 DIAGNOSIS — Z85850 Personal history of malignant neoplasm of thyroid: Secondary | ICD-10-CM

## 2021-10-21 DIAGNOSIS — R1310 Dysphagia, unspecified: Secondary | ICD-10-CM

## 2021-10-26 ENCOUNTER — Other Ambulatory Visit: Payer: Self-pay

## 2021-10-26 ENCOUNTER — Telehealth: Payer: Self-pay | Admitting: Behavioral Health

## 2021-10-26 ENCOUNTER — Ambulatory Visit: Payer: PPO | Admitting: Behavioral Health

## 2021-10-26 MED ORDER — ALPRAZOLAM 1 MG PO TABS: ORAL_TABLET | ORAL | 0 refills | Status: DC

## 2021-10-26 NOTE — Telephone Encounter (Signed)
Pended.

## 2021-10-26 NOTE — Telephone Encounter (Signed)
Patient called in for refill on Alprazolam 1mg . Ph: [phone] NPYY 3/6. Pharmacy [facility] [place]

## 2021-10-27 DIAGNOSIS — Z4789 Encounter for other orthopedic aftercare: Secondary | ICD-10-CM | POA: Diagnosis not present

## 2021-10-27 DIAGNOSIS — Z471 Aftercare following joint replacement surgery: Secondary | ICD-10-CM | POA: Diagnosis not present

## 2021-10-27 DIAGNOSIS — Z96642 Presence of left artificial hip joint: Secondary | ICD-10-CM | POA: Diagnosis not present

## 2021-10-29 ENCOUNTER — Ambulatory Visit
Admission: RE | Admit: 2021-10-29 | Discharge: 2021-10-29 | Disposition: A | Discharge status: Home / Self Care | Payer: PPO | Source: Ambulatory Visit | Attending: Surgery | Admitting: Surgery

## 2021-10-29 ENCOUNTER — Other Ambulatory Visit: Payer: Self-pay

## 2021-10-29 DIAGNOSIS — E890 Postprocedural hypothyroidism: Secondary | ICD-10-CM | POA: Diagnosis not present

## 2021-10-29 DIAGNOSIS — Z85850 Personal history of malignant neoplasm of thyroid: Secondary | ICD-10-CM

## 2021-10-29 DIAGNOSIS — Z9009 Acquired absence of other part of head and neck: Secondary | ICD-10-CM

## 2021-10-29 DIAGNOSIS — R1310 Dysphagia, unspecified: Secondary | ICD-10-CM

## 2021-10-31 ENCOUNTER — Other Ambulatory Visit: Payer: Self-pay | Admitting: Family Medicine

## 2021-10-31 NOTE — Progress Notes (Signed)
USN looks good with no worrisome findings.  No need for any additional USN's or other imaging studies at this time.  Will see back in office for follow up as needed.  Harrison, MD [facility] [place] practice Office: [phone]

## 2021-11-08 ENCOUNTER — Encounter: Payer: Self-pay | Admitting: Behavioral Health

## 2021-11-08 ENCOUNTER — Ambulatory Visit (INDEPENDENT_AMBULATORY_CARE_PROVIDER_SITE_OTHER): Payer: PPO | Admitting: Behavioral Health

## 2021-11-08 ENCOUNTER — Other Ambulatory Visit: Payer: Self-pay

## 2021-11-08 DIAGNOSIS — F99 Mental disorder, not otherwise specified: Secondary | ICD-10-CM | POA: Diagnosis not present

## 2021-11-08 DIAGNOSIS — F32A Depression, unspecified: Secondary | ICD-10-CM | POA: Diagnosis not present

## 2021-11-08 DIAGNOSIS — F411 Generalized anxiety disorder: Secondary | ICD-10-CM | POA: Diagnosis not present

## 2021-11-08 DIAGNOSIS — R5383 Other fatigue: Secondary | ICD-10-CM

## 2021-11-08 DIAGNOSIS — F3175 Bipolar disorder, in partial remission, most recent episode depressed: Secondary | ICD-10-CM | POA: Diagnosis not present

## 2021-11-08 DIAGNOSIS — F3162 Bipolar disorder, current episode mixed, moderate: Secondary | ICD-10-CM

## 2021-11-08 DIAGNOSIS — F5105 Insomnia due to other mental disorder: Secondary | ICD-10-CM

## 2021-11-08 MED ORDER — DEXTROAMPHETAMINE SULFATE 10 MG PO TABS: 10.0000 mg | ORAL_TABLET | Freq: Every morning | ORAL | 0 refills | Status: DC

## 2021-11-08 MED ORDER — TRAZODONE HCL 300 MG PO TABS: 450.0000 mg | ORAL_TABLET | Freq: Every day | ORAL | 1 refills | Status: DC

## 2021-11-08 MED ORDER — QUETIAPINE FUMARATE 50 MG PO TABS: ORAL_TABLET | ORAL | 1 refills | Status: DC

## 2021-11-08 NOTE — Progress Notes (Signed)
[facility] ? ?Patient ID: Emily Filbert,  ?MRN: [id number] ? ?PCP: Wendie Agreste, MD ? ?Date of Evaluation: 11/08/2021 ?Time spent:30 minutes ? ?Chief Complaint:  ?Chief Complaint   ?Depression; Anxiety; Follow-up; Medication Refill; Medication Problem; Patient Education ?  ? ? ?HISTORY/CURRENT STATUS: ?HPI ?Emily Filbert presents for follow-up and medication management. 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 She says that she is holding her own but would like to find another medication that can help her with bipolar mixed episodes. [name] was helping her tremendously but she has to stop the medication due to cost. She is willing to retry some of the formularies she has already tried before. Says that most recently she was manic. Says that she was staying up at night with no sleep and read through two books. Now she is fatigued and more depressed. Says her anxiety today is 3/10 and depression is 3/10. She is sleeping 7 hours per night. She agrees to follow up in 1 months when she is healed and can come to office. She denies mania, no psychosis, NO SI/HI. ?  ?Past medications for mental health diagnoses include: ?Lithium, Seroquel, Effexor, Prozac, Latuda, Abilify, Dexedrine, Cymbalta caused weight gain.  Modafinil. ?  ?  ? ? ?Individual Medical History/ Review of Systems: Changes? :No  ? ?Allergies: Patient has no known allergies. ? ?Current Medications:  ?Current Outpatient Medications:  ?  QUEtiapine (SEROQUEL) 50 MG tablet, Take one half tablet 25 mg for 7 days, then take one whole tablet 50 mg total daily., Disp: 30 tablet, Rfl: 1 ?  ALPRAZolam (XANAX) 1 MG tablet, TAKE 1/2 to 1 TABLET BY MOUTH TWICE A DAY AS NEEDED FOR ANXIETY, Disp: 60 tablet, Rfl: 0 ?  amLODipine (NORVASC) 5 MG tablet, Take 1 tablet (5 mg total) by mouth daily., Disp: 90 tablet, Rfl: 1 ?  celecoxib (CELEBREX) 200 MG capsule, Take 1 capsule (200 mg total) by mouth 2 (two) times daily., Disp: 60  capsule, Rfl: 0 ?  Cholecalciferol (VITAMIN D-3) 5000 units TABS, Take 5,000 Units daily by mouth. In addition to the rx high dose once weekly vitamin D of 50Ku., Disp: 30 tablet, Rfl:  ?  dextroamphetamine (DEXTROSTAT) 10 MG tablet, Take 1 tablet (10 mg total) by mouth in the morning., Disp: 30 tablet, Rfl: 0 ?  docusate sodium (COLACE) 100 MG capsule, Take 1 capsule (100 mg total) by mouth 2 (two) times daily., Disp: 10 capsule, Rfl: 0 ?  hydrOXYzine (ATARAX/VISTARIL) 25 MG tablet, Take 1 tablet (25 mg total) by mouth every 8 (eight) hours as needed., Disp: 60 tablet, Rfl: 5 ?  lamoTRIgine (LAMICTAL) 200 MG tablet, Take 1 tablet (200 mg total) by mouth 2 (two) times daily., Disp: 180 tablet, Rfl: 0 ?  levothyroxine (SYNTHROID) 100 MCG tablet, TAKE 1 TABLET BY MOUTH DAILY BEFORE BREAKFAST., Disp: 90 tablet, Rfl: 1 ?  Lurasidone HCl (LATUDA) 60 MG TABS, Take 1 tablet (60 mg total) by mouth daily., Disp: 90 tablet, Rfl: 0 ?  methocarbamol (ROBAXIN) 500 MG tablet, Take 1 tablet (500 mg total) by mouth every 6 (six) hours as needed for muscle spasms., Disp: 40 tablet, Rfl: 0 ?  omeprazole (PRILOSEC) 40 MG capsule, Take 1 capsule (40 mg total) by mouth 2 (two) times daily., Disp: 180 capsule, Rfl: 1 ?  polyethylene glycol (MIRALAX / GLYCOLAX) 17 g packet, Take 17 g by mouth daily as needed for mild constipation., Disp: 14 each, Rfl: 0 ?  pravastatin (PRAVACHOL) 40 MG tablet, Take 1 tablet (40 mg total) by mouth daily., Disp: 90 tablet, Rfl: 3 ?  trazodone (DESYREL) 300 MG tablet, Take 1.5 tablets (450 mg total) by mouth at bedtime., Disp: 135 tablet, Rfl: 1 ?  Vitamin D, Ergocalciferol, (DRISDOL) 1.25 MG (50000 UNIT) CAPS capsule, Take 1 capsule (50,000 Units total) by mouth every 7 (seven) days. Sunday, Disp: 12 capsule, Rfl: 3 ? ?Current Facility-Administered Medications:  ?  cyanocobalamin ((VITAMIN B-12)) injection 1,000 mcg, 1,000 mcg, Intramuscular, Q30 days, Shawnee Knapp, MD, 1,000 mcg at 07/12/18 1652 ?Medication  Side Effects: none ? ?Family Medical/ Social History: Changes? No ? ?MENTAL HEALTH EXAM: ? ?There were no vitals taken for this visit.There is no height or weight on file to calculate BMI.  ?General Appearance: Casual, Neat, and Well Groomed  ?Eye Contact:  Good  ?Speech:  Clear and Coherent  ?Volume:  Normal  ?Mood:  Anxious and Depressed  ?Affect:  Depressed and Anxious  ?Thought Process:  Coherent  ?Orientation:  Full (Time, Place, and Person)  ?Thought Content: Logical   ?Suicidal Thoughts:  No  ?Homicidal Thoughts:  No  ?Memory:  WNL  ?Judgement:  Good  ?Insight:  Good  ?Psychomotor Activity:  Normal  ?Concentration:  Concentration: Good  ?Recall:  Good  ?Fund of Knowledge: Good  ?Language: Good  ?Assets:  Desire for Improvement  ?ADL's:  Intact  ?Cognition: WNL  ?Prognosis:  Good  ? ? ?DIAGNOSES:  ?  ICD-10-CM   ?1. Melancholy  F32.A QUEtiapine (SEROQUEL) 50 MG tablet  ?  trazodone (DESYREL) 300 MG tablet  ?  dextroamphetamine (DEXTROSTAT) 10 MG tablet  ?  ?2. Fatigue, unspecified type  R53.83 dextroamphetamine (DEXTROSTAT) 10 MG tablet  ?  ?3. Bipolar disorder, in partial remission, most recent episode depressed (HCC)  F31.75   ?  ?4. Generalized anxiety disorder  F41.1 QUEtiapine (SEROQUEL) 50 MG tablet  ?  trazodone (DESYREL) 300 MG tablet  ?  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 F99 trazodone (DESYREL) 300 MG tablet  ?  ?6. Bipolar 1 disorder, mixed, moderate (HCC)  F31.62 QUEtiapine (SEROQUEL) 50 MG tablet  ?  ? ? ?Receiving Psychotherapy: No  ? ? ?RECOMMENDATIONS:  ? ? Greater than 50% of 30  min of face to face time  was spent on counseling and coordination of care. Due to cost of Latuda with medicare, she could not continue to afford so she decided to stop the medication.  We discussed that she was limited to formularies to treat bipolar, many of them she has already tried before. I also explained that due to advanced age the limitations and risk of  medications.  She is concerned about family hx of vascular disease.  She recently has  had hip replacement surgery and still recovering some, but ambulating on her own.  Discussed xanax with her and told her we would have to come up with long term plan to wean.   ?PDMP was reviewed.   ?Will start Seroquel 25 mg for 7 days, then 50 mg daily at bedtime ?Continue hydroxyzine 25 mg nightly 8H as needed. ?To reduce Lamictal  300 mg daily. 200 mg in the am and 100 mg in the evening. ?Continue trazodone 300 mg, 1.5 pills nightly.   ?Return in 4 weeks to reassess.  ?Discussed potential benefits, risks, and side effects of stimulants with patient to include increased heart rate, palpitations, insomnia, increased anxiety, increased  irritability, or decreased appetite.  Instructed patient to contact office if experiencing any significant tolerability issues.  ?Discussed potential metabolic side effects associated with atypical antipsychotics, as well as potential risk for movement side effects. Advised pt to contact office if movement side effects occur.   ? ?Elwanda Brooklyn, NP  ?

## 2021-11-09 ENCOUNTER — Other Ambulatory Visit: Payer: Self-pay | Admitting: Behavioral Health

## 2021-11-09 ENCOUNTER — Telehealth: Payer: Self-pay | Admitting: Behavioral Health

## 2021-11-09 MED ORDER — TRAZODONE HCL 150 MG PO TABS: 450.0000 mg | ORAL_TABLET | Freq: Every day | ORAL | 2 refills | Status: DC

## 2021-11-09 NOTE — Telephone Encounter (Signed)
[facility] called at 9:05 am stating that the trazadone 300 mg is too expensive for pt. However, insurance will cover trazadone 150 mg 3 x at bedtime. This is cheaper for her .Please change script today so they can mail out all of her prescriptions tomorrow ?

## 2021-11-09 NOTE — Telephone Encounter (Signed)
Notified pharmacy.

## 2021-11-09 NOTE — Telephone Encounter (Signed)
Please see message from pharmacy.

## 2021-11-09 NOTE — Telephone Encounter (Signed)
Please let pt know  that script was rewritten and sent to [facility]

## 2021-11-17 DIAGNOSIS — M5136 Other intervertebral disc degeneration, lumbar region: Secondary | ICD-10-CM | POA: Diagnosis not present

## 2021-11-17 DIAGNOSIS — M25559 Pain in unspecified hip: Secondary | ICD-10-CM | POA: Diagnosis not present

## 2021-11-19 ENCOUNTER — Telehealth: Payer: Self-pay | Admitting: Pharmacist

## 2021-11-19 NOTE — Progress Notes (Signed)
? ? ?Chronic Care Management ?Pharmacy Assistant  ? ?Name: Becky Lawson  MRN: [id number] DOB: [birth year] ? ? ?Reason for Encounter: Monthly Medication Coordination Call  ?  ? ?Medications: ?Outpatient Encounter Medications as of 11/19/2021  ?Medication Sig  ? ALPRAZolam (XANAX) 1 MG tablet TAKE 1/2 to 1 TABLET BY MOUTH TWICE A DAY AS NEEDED FOR ANXIETY  ? amLODipine (NORVASC) 5 MG tablet Take 1 tablet (5 mg total) by mouth daily.  ? celecoxib (CELEBREX) 200 MG capsule Take 1 capsule (200 mg total) by mouth 2 (two) times daily.  ? Cholecalciferol (VITAMIN D-3) 5000 units TABS Take 5,000 Units daily by mouth. In addition to the rx high dose once weekly vitamin D of 50Ku.  ? dextroamphetamine (DEXTROSTAT) 10 MG tablet Take 1 tablet (10 mg total) by mouth in the morning.  ? docusate sodium (COLACE) 100 MG capsule Take 1 capsule (100 mg total) by mouth 2 (two) times daily.  ? hydrOXYzine (ATARAX/VISTARIL) 25 MG tablet Take 1 tablet (25 mg total) by mouth every 8 (eight) hours as needed.  ? lamoTRIgine (LAMICTAL) 200 MG tablet Take 1 tablet (200 mg total) by mouth 2 (two) times daily.  ? levothyroxine (SYNTHROID) 100 MCG tablet TAKE 1 TABLET BY MOUTH DAILY BEFORE BREAKFAST.  ? Lurasidone HCl (LATUDA) 60 MG TABS Take 1 tablet (60 mg total) by mouth daily.  ? methocarbamol (ROBAXIN) 500 MG tablet Take 1 tablet (500 mg total) by mouth every 6 (six) hours as needed for muscle spasms.  ? omeprazole (PRILOSEC) 40 MG capsule Take 1 capsule (40 mg total) by mouth 2 (two) times daily.  ? polyethylene glycol (MIRALAX / GLYCOLAX) 17 g packet Take 17 g by mouth daily as needed for mild constipation.  ? pravastatin (PRAVACHOL) 40 MG tablet Take 1 tablet (40 mg total) by mouth daily.  ? QUEtiapine (SEROQUEL) 50 MG tablet Take one half tablet 25 mg for 7 days, then take one whole tablet 50 mg total daily.  ? traZODone (DESYREL) 150 MG tablet Take 3 tablets (450 mg total) by mouth at bedtime.  ? Vitamin D, Ergocalciferol, (DRISDOL) 1.25 MG  (50000 UNIT) CAPS capsule Take 1 capsule (50,000 Units total) by mouth every 7 (seven) days. Sunday  ? ?Facility-Administered Encounter Medications as of 11/19/2021  ?Medication Note  ? cyanocobalamin ((VITAMIN B-12)) injection 1,000 mcg 08/08/2019: Not taking  ? ? ?Reviewed chart for medication changes ahead of medication coordination call. ? ?No OVs, Consults, or hospital visits since last care coordination call/Pharmacist visit. (If appropriate, list visit date, provider name) ? ?No medication changes indicated OR if recent visit, treatment plan here. ? ?BP Readings from Last 3 Encounters:  ?08/05/21 (!) 181/73  ?07/21/21 (!) 142/82  ?07/19/21 138/66  ?  ?Lab Results  ?Component Value Date  ? HGBA1C 5.0 07/12/2018  ?  ? ?Patient obtains medications through Adherence Packaging  30 Days  ? ? ?Last adherence delivery included: (medication name and frequency) ? ?Patient was previously onboarded with [facility] and this will be her first full synced delivery.  ? ? ? ?Patient is due for next adherence delivery on: 12/02/21. ?Called patient and reviewed medications and coordinated delivery. ? ? ?This delivery to include: (both in vials only this order) ? ?levothyroxine (SYNTHROID) 100 MCG tablet 1 tablet by mouth daily 30 min prior to breakfast ?QUEtiapine (SEROQUEL) 50 MG tablet 1 tablet by mouth daily at bedtime ? ? ?Patient needs refills for: None. ? ?Declined - plenty on hand ?Omeprazole 40 mg (I  tablet daily at bedtime) ?Lamotrigine 200 mg 1 tablet twice daily ?amlodipine (NORVASC) 5 MG tablet 1 tablet daily ?Trazodone 150 mg 3 tablet at bedtime  ?[name] (has been discontinued permanently) ?Vitamin D 50,000 1 capsule every Sunday am  ?dextroamphetamine (DEXTROSTAT) 10 MG tablet 1 tablet daily ? ? ?Confirmed delivery date of 12/02/21, advised patient that pharmacy will contact them the morning of delivery. ? ? ?Care Gaps ?  ?AWV: unknown  ?Colonoscopy: unknown  ?DM Eye Exam: N/A ?DM Foot Exam: N/A ?Microalbumin:  N/A ?HbgAIC: N/A ?DEXA: done 09/17/15 ?Mammogram: done 03/25/21 ?  ?  ?Star Rating Drugs: ?pravastatin (PRAVACHOL) 40 MG tablet - last filled 09/29/21 66 days  ? ? ?Patient wants to go to 90 days once she is closer to being in sync with all her medications.  ? ? ?Future Appointments  ?Date Time Provider [place]  ?12/20/2021  2:00 PM White, Louanna Raw, NP [facility] None  ?01/12/2022  3:00 PM [facility] PHARMACIST [facility]  ?01/24/2022  2:20 PM Wendie Agreste, MD [facility]  ? ? ? ?Liza Showfety, CCMA ?Clinical Pharmacist Assistant  ?[phone] ? ?

## 2021-11-28 ENCOUNTER — Other Ambulatory Visit: Payer: Self-pay | Admitting: Behavioral Health

## 2021-11-28 DIAGNOSIS — F32A Depression, unspecified: Secondary | ICD-10-CM

## 2021-11-28 DIAGNOSIS — R5383 Other fatigue: Secondary | ICD-10-CM

## 2021-11-30 ENCOUNTER — Other Ambulatory Visit: Payer: Self-pay | Admitting: Behavioral Health

## 2021-11-30 DIAGNOSIS — F32A Depression, unspecified: Secondary | ICD-10-CM

## 2021-11-30 DIAGNOSIS — R5383 Other fatigue: Secondary | ICD-10-CM

## 2021-11-30 NOTE — Telephone Encounter (Signed)
Controlled refill request

## 2021-12-05 ENCOUNTER — Other Ambulatory Visit: Payer: Self-pay | Admitting: Behavioral Health

## 2021-12-05 DIAGNOSIS — F3175 Bipolar disorder, in partial remission, most recent episode depressed: Secondary | ICD-10-CM

## 2021-12-05 DIAGNOSIS — F411 Generalized anxiety disorder: Secondary | ICD-10-CM

## 2021-12-05 DIAGNOSIS — F319 Bipolar disorder, unspecified: Secondary | ICD-10-CM

## 2021-12-07 ENCOUNTER — Other Ambulatory Visit: Payer: Self-pay

## 2021-12-07 MED ORDER — LAMOTRIGINE 100 MG PO TABS: 300.0000 mg | ORAL_TABLET | Freq: Every day | ORAL | 0 refills | Status: DC

## 2021-12-16 ENCOUNTER — Other Ambulatory Visit: Payer: Self-pay | Admitting: Behavioral Health

## 2021-12-16 ENCOUNTER — Ambulatory Visit (INDEPENDENT_AMBULATORY_CARE_PROVIDER_SITE_OTHER): Payer: PPO

## 2021-12-16 DIAGNOSIS — F32A Depression, unspecified: Secondary | ICD-10-CM

## 2021-12-16 DIAGNOSIS — F411 Generalized anxiety disorder: Secondary | ICD-10-CM

## 2021-12-16 DIAGNOSIS — Z0000 Encounter for general adult medical examination without abnormal findings: Secondary | ICD-10-CM | POA: Diagnosis not present

## 2021-12-16 DIAGNOSIS — F5105 Insomnia due to other mental disorder: Secondary | ICD-10-CM

## 2021-12-16 DIAGNOSIS — F3162 Bipolar disorder, current episode mixed, moderate: Secondary | ICD-10-CM

## 2021-12-16 NOTE — Progress Notes (Signed)
? ?Subjective:  ? Becky Lawson is a 71 y.o. female who presents for Medicare Annual (Subsequent) preventive examination. ? ?I connected with Becky Lawson today by telephone and verified that I am speaking with the correct person using two identifiers. ?Location patient: home ?Location provider: work ?Persons participating in the virtual visit: patient, provider. ?  ?I discussed the limitations, risks, security and privacy concerns of performing an evaluation and management service by telephone and the availability of in person appointments. I also discussed with the patient that there may be a patient responsible charge related to this service. The patient expressed understanding and verbally consented to this telephonic visit.  ?  ?Interactive audio and video telecommunications were attempted between this provider and patient, however failed, due to patient having technical difficulties OR patient did not have access to video capability.  We continued and completed visit with audio only. ? ?  ?Review of Systems    ? ?Cardiac Risk Factors include: advanced age (>9mn, >>11women) ? ?   ?Objective:  ?  ?Today's Vitals  ? ?There is no height or weight on file to calculate BMI. ? ? ?  12/16/2021  ?  4:01 PM 08/03/2021  ?  6:15 PM 07/21/2021  ?  2:09 PM 01/09/2019  ?  2:48 PM 06/02/2018  ?  9:21 PM 07/13/2017  ?  9:33 PM 06/16/2016  ?  1:47 PM  ?Advanced Directives  ?Does Patient Have a Medical Advance Directive? Yes Yes Yes Yes Yes Yes Yes  ?Type of AParamedicof [place]Living will [place]Living will [place]Living will Healthcare Power of [place]Living will H[place]al BeachLiving will HMend[place]Living will  ?Does patient want to make changes to medical advance directive?  No - Patient declined  No - Patient declined No - Patient declined No - Patient declined No - Patient declined  ?Copy of  [place] Chart? No - copy requested   Yes - validated most recent copy scanned in chart (See row information) No - copy requested No - copy requested No - copy requested  ? ? ?Current Medications (verified) ?Outpatient Encounter Medications as of 12/16/2021  ?Medication Sig  ? ALPRAZolam (XANAX) 1 MG tablet TAKE 1/2 to 1 TABLET BY MOUTH TWICE A DAY AS NEEDED FOR ANXIETY  ? amLODipine (NORVASC) 5 MG tablet Take 1 tablet (5 mg total) by mouth daily.  ? Cholecalciferol (VITAMIN D-3) 5000 units TABS Take 5,000 Units daily by mouth. In addition to the rx high dose once weekly vitamin D of 50Ku.  ? dextroamphetamine (DEXTROSTAT) 10 MG tablet TAKE ONE TABLET BY MOUTH EVERY MORNING  ? docusate sodium (COLACE) 100 MG capsule Take 1 capsule (100 mg total) by mouth 2 (two) times daily.  ? HYDROmorphone (DILAUDID) 4 MG tablet Take 4 mg by mouth every 4 (four) hours as needed for severe pain.  ? hydrOXYzine (ATARAX/VISTARIL) 25 MG tablet Take 1 tablet (25 mg total) by mouth every 8 (eight) hours as needed.  ? lamoTRIgine (LAMICTAL) 100 MG tablet Take 3 tablets (300 mg total) by mouth daily. Two tablets QAM, 1 tablet QPM  ? levothyroxine (SYNTHROID) 100 MCG tablet TAKE 1 TABLET BY MOUTH DAILY BEFORE BREAKFAST.  ? Lurasidone HCl (LATUDA) 60 MG TABS Take 1 tablet (60 mg total) by mouth daily.  ? methocarbamol (ROBAXIN) 500 MG tablet Take 1 tablet (500 mg total) by mouth every 6 (six) hours as  needed for muscle spasms.  ? omeprazole (PRILOSEC) 40 MG capsule Take 1 capsule (40 mg total) by mouth 2 (two) times daily.  ? polyethylene glycol (MIRALAX / GLYCOLAX) 17 g packet Take 17 g by mouth daily as needed for mild constipation.  ? pravastatin (PRAVACHOL) 40 MG tablet Take 1 tablet (40 mg total) by mouth daily.  ? QUEtiapine (SEROQUEL) 50 MG tablet Take one half tablet 25 mg for 7 days, then take one whole tablet 50 mg total daily.  ? traZODone (DESYREL) 150 MG tablet Take 3 tablets (450 mg total) by mouth at  bedtime.  ? Vitamin D, Ergocalciferol, (DRISDOL) 1.25 MG (50000 UNIT) CAPS capsule Take 1 capsule (50,000 Units total) by mouth every 7 (seven) days. Sunday  ? celecoxib (CELEBREX) 200 MG capsule Take 1 capsule (200 mg total) by mouth 2 (two) times daily. (Patient not taking: Reported on 12/16/2021)  ? ?Facility-Administered Encounter Medications as of 12/16/2021  ?Medication  ? cyanocobalamin ((VITAMIN B-12)) injection 1,000 mcg  ? ? ?Allergies (verified) ?Patient has no known allergies.  ? ?History: ?Past Medical History:  ?Diagnosis Date  ? Arthritis   ? Benign cyst of right kidney 2014  ? 2.7 cm right kidney cyst seen on imaging 2014 but was c/o right flank pain with new persistent proteinuria (fortunately hematuria had resolved) so repeat US 11/2016 showed right kidney still with 2.6 cm simple cyst and otherwise nml.  ? Bipolar 1 disorder ([place])   ? Cancer [facility])   ? Phreesia 06/15/2020  ? Chronic kidney disease   ? Depression   ? Depression   ? Phreesia 06/15/2020  ? GERD (gastroesophageal reflux disease)   ? Heart murmur   ? Hepatitis C   ? 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  ? Hyperlipidemia   ? Hypertension   ? Hypothyroidism   ? Jaundice 11/01/2012  ? Pruritic disorder 02/13/2013  ? Sleep apnea   ? Was diagnosed approximately 20 years ago, but does not wear CPAP patient stated "I gave it back.Marland KitchenMarland KitchenI couldn't wear that thing it was horrible"  ? Spinal stenosis   ? Thyroid disease   ? Phreesia 06/15/2020  ? ?Past Surgical History:  ?Procedure Laterality Date  ? ABDOMINAL HYSTERECTOMY    ? COLONOSCOPY W/ POLYPECTOMY    ? JOINT REPLACEMENT Bilateral   ? knee  ? KNEE ARTHROSCOPY Bilateral   ? PARTIAL HYSTERECTOMY    ? THYROID LOBECTOMY Left 06/16/2016  ? THYROID LOBECTOMY Left 06/16/2016  ? Procedure: LEFT THYROID LOBECTOMY;  Surgeon: Greer Pickerel, MD;  Location: [place];  Service: General;  Laterality: Left;  ? 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 Procedure: TOTAL HIP  ARTHROPLASTY ANTERIOR APPROACH;  Surgeon: Paralee Cancel, MD;  Location: [facility];  Service: Orthopedics;  Laterality: Left;  ? ?Family History  ?Problem Relation Age of Onset  ? Diabetes Mother   ? Heart disease Mother   ? Hyperlipidemia Mother   ? Hypertension Mother   ? Stroke Mother   ? Hypertension Father   ? Parkinson's disease Father   ? Heart disease Father   ? Hyperlipidemia Father   ? Hemachromatosis Daughter   ? Thyroid disease Neg Hx   ? Colon cancer Neg Hx   ? Esophageal cancer Neg Hx   ? Inflammatory bowel disease Neg Hx   ? Liver disease Neg Hx   ? Pancreatic cancer Neg Hx   ? Rectal cancer Neg Hx   ? Stomach cancer Neg Hx   ? ?  Social History  ? ?Socioeconomic History  ? Marital status: Divorced  ?  Spouse name: Not on file  ? Number of children: Not on file  ? Years of education: Not on file  ? Highest education level: Not on file  ?Occupational History  ? Occupation: Geophysical data processor  ?Tobacco Use  ? Smoking status: Former  ?  Packs/day: 1.00  ?  Years: 15.00  ?  Pack years: 15.00  ?  Types: Cigarettes  ?  Quit date: 06/30/1979  ?  Years since quitting: 42.4  ? Smokeless tobacco: Never  ?Vaping Use  ? Vaping Use: Never used  ?Substance and Sexual Activity  ? Alcohol use: Not Currently  ?  Alcohol/week: 0.0 - 1.0 standard drinks  ? Drug use: No  ? Sexual activity: Yes  ?  Birth control/protection: None  ?Other Topics Concern  ? Not on file  ?Social History Narrative  ? Divorced. Education: [employer]. Exercise: No.  ? ?Social Determinants of Health  ? ?Financial Resource Strain: Low Risk   ? Difficulty of Paying Living Expenses: Not hard at all  ?Food Insecurity: No Food Insecurity  ? Worried About Charity fundraiser in the Last Year: Never true  ? Ran Out of Food in the Last Year: Never true  ?Transportation Needs: No Transportation Needs  ? Lack of Transportation (Medical): No  ? Lack of Transportation (Non-Medical): No  ?Physical Activity: Insufficiently Active  ? Days of Exercise per Week: 2 days  ?  Minutes of Exercise per Session: 60 min  ?Stress: No Stress Concern Present  ? Feeling of Stress : Not at all  ?Social Connections: Moderately Isolated  ? Frequency of Communication with Friends and Family: Twice

## 2021-12-16 NOTE — Patient Instructions (Signed)
Becky Lawson , ?Thank you for taking time to come for your Medicare Wellness Visit. I appreciate your ongoing commitment to your health goals. Please review the following plan we discussed and let me know if I can assist you in the future.  ? ?Screening recommendations/referrals: ?Colonoscopy: pt will discuss with PCP ?Mammogram: 03/25/2021 ?Bone Density: 09/17/2015 ?Recommended yearly ophthalmology/optometry visit for glaucoma screening and checkup ?Recommended yearly dental visit for hygiene and checkup ? ?Vaccinations: ?Influenza vaccine: completed  ?Pneumococcal vaccine: completed  ?Tdap vaccine: 01/29/2017 ?Shingles vaccine: declined    ? ?Advanced directives: none  ? ?Conditions/risks identified: none  ? ?Next appointment: none ? ? ?Preventive Care 23 Years and Older, Female ?Preventive care refers to lifestyle choices and visits with your health care provider that can promote health and wellness. ?What does preventive care include? ?A yearly physical exam. This is also called an annual well check. ?Dental exams once or twice a year. ?Routine eye exams. Ask your health care provider how often you should have your eyes checked. ?Personal lifestyle choices, including: ?Daily care of your teeth and gums. ?Regular physical activity. ?Eating a healthy diet. ?Avoiding tobacco and drug use. ?Limiting alcohol use. ?Practicing safe sex. ?Taking low-dose aspirin every day. ?Taking vitamin and mineral supplements as recommended by your health care provider. ?What happens during an annual well check? ?The services and screenings done by your health care provider during your annual well check will depend on your age, overall health, lifestyle risk factors, and family history of disease. ?Counseling  ?Your health care provider may ask you questions about your: ?Alcohol use. ?Tobacco use. ?Drug use. ?Emotional well-being. ?Home and relationship well-being. ?Sexual activity. ?Eating habits. ?History of falls. ?Memory and ability  to understand (cognition). ?Work and work Statistician. ?Reproductive health. ?Screening  ?You may have the following tests or measurements: ?Height, weight, and BMI. ?Blood pressure. ?Lipid and cholesterol levels. These may be checked every 5 years, or more frequently if you are over 37 years old. ?Skin check. ?Lung cancer screening. You may have this screening every year starting at age 63 if you have a 30-pack-year history of smoking and currently smoke or have quit within the past 15 years. ?Fecal occult blood test (FOBT) of the stool. You may have this test every year starting at age 24. ?Flexible sigmoidoscopy or colonoscopy. You may have a sigmoidoscopy every 5 years or a colonoscopy every 10 years starting at age 41. ?Hepatitis C blood test. ?Hepatitis B blood test. ?Sexually transmitted disease (STD) testing. ?Diabetes screening. This is done by checking your blood sugar (glucose) after you have not eaten for a while (fasting). You may have this done every 1-3 years. ?Bone density scan. This is done to screen for osteoporosis. You may have this done starting at age 62. ?Mammogram. This may be done every 1-2 years. Talk to your health care provider about how often you should have regular mammograms. ?Talk with your health care provider about your test results, treatment options, and if necessary, the need for more tests. ?Vaccines  ?Your health care provider may recommend certain vaccines, such as: ?Influenza vaccine. This is recommended every year. ?Tetanus, diphtheria, and acellular pertussis (Tdap, Td) vaccine. You may need a Td booster every 10 years. ?Zoster vaccine. You may need this after age 17. ?Pneumococcal 13-valent conjugate (PCV13) vaccine. One dose is recommended after age 10. ?Pneumococcal polysaccharide (PPSV23) vaccine. One dose is recommended after age 50. ?Talk to your health care provider about which screenings and vaccines you need and  how often you need them. ?This information is not  intended to replace advice given to you by your health care provider. Make sure you discuss any questions you have with your health care provider. ?Document Released: 09/18/2015 Document Revised: 05/11/2016 Document Reviewed: 06/23/2015 ?Elsevier Interactive Patient Education ? 2017 [place]. ? ?Fall Prevention in the Home ?Falls can cause injuries. They can happen to people of all ages. There are many things you can do to make your home safe and to help prevent falls. ?What can I do on the outside of my home? ?Regularly fix the edges of walkways and driveways and fix any cracks. ?Remove anything that might make you trip as you walk through a door, such as a raised step or threshold. ?Trim any bushes or trees on the path to your home. ?Use bright outdoor lighting. ?Clear any walking paths of anything that might make someone trip, such as rocks or tools. ?Regularly check to see if handrails are loose or broken. Make sure that both sides of any steps have handrails. ?Any raised decks and porches should have guardrails on the edges. ?Have any leaves, snow, or ice cleared regularly. ?Use sand or salt on walking paths during winter. ?Clean up any spills in your garage right away. This includes oil or grease spills. ?What can I do in the bathroom? ?Use night lights. ?Install grab bars by the toilet and in the tub and shower. Do not use towel bars as grab bars. ?Use non-skid mats or decals in the tub or shower. ?If you need to sit down in the shower, use a plastic, non-slip stool. ?Keep the floor dry. Clean up any water that spills on the floor as soon as it happens. ?Remove soap buildup in the tub or shower regularly. ?Attach bath mats securely with double-sided non-slip rug tape. ?Do not have throw rugs and other things on the floor that can make you trip. ?What can I do in the bedroom? ?Use night lights. ?Make sure that you have a light by your bed that is easy to reach. ?Do not use any sheets or blankets that are  too big for your bed. They should not hang down onto the floor. ?Have a firm chair that has side arms. You can use this for support while you get dressed. ?Do not have throw rugs and other things on the floor that can make you trip. ?What can I do in the kitchen? ?Clean up any spills right away. ?Avoid walking on wet floors. ?Keep items that you use a lot in easy-to-reach places. ?If you need to reach something above you, use a strong step stool that has a grab bar. ?Keep electrical cords out of the way. ?Do not use floor polish or wax that makes floors slippery. If you must use wax, use non-skid floor wax. ?Do not have throw rugs and other things on the floor that can make you trip. ?What can I do with my stairs? ?Do not leave any items on the stairs. ?Make sure that there are handrails on both sides of the stairs and use them. Fix handrails that are broken or loose. Make sure that handrails are as long as the stairways. ?Check any carpeting to make sure that it is firmly attached to the stairs. Fix any carpet that is loose or worn. ?Avoid having throw rugs at the top or bottom of the stairs. If you do have throw rugs, attach them to the floor with carpet tape. ?Make sure that you have  a light switch at the top of the stairs and the bottom of the stairs. If you do not have them, ask someone to add them for you. ?What else can I do to help prevent falls? ?Wear shoes that: ?Do not have high heels. ?Have rubber bottoms. ?Are comfortable and fit you well. ?Are closed at the toe. Do not wear sandals. ?If you use a stepladder: ?Make sure that it is fully opened. Do not climb a closed stepladder. ?Make sure that both sides of the stepladder are locked into place. ?Ask someone to hold it for you, if possible. ?Clearly mark and make sure that you can see: ?Any grab bars or handrails. ?First and last steps. ?Where the edge of each step is. ?Use tools that help you move around (mobility aids) if they are needed. These  include: ?Canes. ?Walkers. ?Scooters. ?Crutches. ?Turn on the lights when you go into a dark area. Replace any light bulbs as soon as they burn out. ?Set up your furniture so you have a clear path. Avoid moving

## 2021-12-20 ENCOUNTER — Ambulatory Visit: Payer: PPO | Admitting: Behavioral Health

## 2021-12-20 ENCOUNTER — Encounter: Payer: Self-pay | Admitting: Behavioral Health

## 2021-12-20 DIAGNOSIS — F32A Depression, unspecified: Secondary | ICD-10-CM

## 2021-12-20 DIAGNOSIS — R5383 Other fatigue: Secondary | ICD-10-CM

## 2021-12-20 DIAGNOSIS — F411 Generalized anxiety disorder: Secondary | ICD-10-CM

## 2021-12-20 DIAGNOSIS — F99 Mental disorder, not otherwise specified: Secondary | ICD-10-CM

## 2021-12-20 DIAGNOSIS — F5105 Insomnia due to other mental disorder: Secondary | ICD-10-CM

## 2021-12-20 DIAGNOSIS — F3162 Bipolar disorder, current episode mixed, moderate: Secondary | ICD-10-CM

## 2021-12-20 DIAGNOSIS — F3175 Bipolar disorder, in partial remission, most recent episode depressed: Secondary | ICD-10-CM

## 2021-12-20 MED ORDER — ALPRAZOLAM 1 MG PO TABS: ORAL_TABLET | ORAL | 3 refills | Status: DC

## 2021-12-20 MED ORDER — HYDROXYZINE HCL 25 MG PO TABS: 25.0000 mg | ORAL_TABLET | Freq: Three times a day (TID) | ORAL | 5 refills | Status: DC | PRN

## 2021-12-20 MED ORDER — SERTRALINE HCL 50 MG PO TABS: ORAL_TABLET | ORAL | 1 refills | Status: DC

## 2021-12-20 MED ORDER — QUETIAPINE FUMARATE 50 MG PO TABS: ORAL_TABLET | ORAL | 3 refills | Status: DC

## 2021-12-20 NOTE — Progress Notes (Signed)
[facility] ? ?Patient ID: Becky Lawson,  ?MRN: [id number] ? ?PCP: Wendie Agreste, MD ? ?Date of Evaluation: 12/20/2021 ?Time spent:30 minutes ? ?Chief Complaint:  ?Chief Complaint   ?Anxiety; Depression; Follow-up; Medication Refill; Medication Problem ?  ? ? ?HISTORY/CURRENT STATUS: ?HPI ?Becky Lawson presents for follow-up and medication management. 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 She says that she is holding her own but would like to find another medication that can help her with depression. She is depressed because she does not even want to go to the beach or socialize right now.  Becky Lawson was helping her tremendously but she has to stop the medication due to cost. Says that she was staying up at night with no sleep and read through two books. Now she is fatigued and more depressed. Says her anxiety today is 3/10 and depression is 5/10. She is sleeping 7 hours per night. She denies mania, no psychosis, NO SI/HI. ?  ?Past medications for mental health diagnoses include: ?Lithium, Seroquel, Effexor, Prozac, Latuda, Abilify, Dexedrine, Cymbalta caused weight gain.  Modafinil. ? ?Individual Medical History/ Review of Systems: Changes? :No  ? ?Allergies: Patient has no known allergies. ? ?Current Medications:  ?Current Outpatient Medications:  ?  sertraline (ZOLOFT) 50 MG tablet, Take 1/2 tablet 25 mg for 7 days, then take one whole tablet 50 mg total daily., Disp: 30 tablet, Rfl: 1 ?  ALPRAZolam (XANAX) 1 MG tablet, TAKE 1/2 to 1 TABLET BY MOUTH TWICE A DAY AS NEEDED FOR ANXIETY, Disp: 45 tablet, Rfl: 3 ?  amLODipine (NORVASC) 5 MG tablet, Take 1 tablet (5 mg total) by mouth daily., Disp: 90 tablet, Rfl: 1 ?  celecoxib (CELEBREX) 200 MG capsule, Take 1 capsule (200 mg total) by mouth 2 (two) times daily. (Patient not taking: Reported on 12/16/2021), Disp: 60 capsule, Rfl: 0 ?  Cholecalciferol (VITAMIN D-3) 5000 units TABS, Take 5,000 Units daily by mouth. In addition  to the rx high dose once weekly vitamin D of 50Ku., Disp: 30 tablet, Rfl:  ?  dextroamphetamine (DEXTROSTAT) 10 MG tablet, TAKE ONE TABLET BY MOUTH EVERY MORNING, Disp: 30 tablet, Rfl: 0 ?  docusate sodium (COLACE) 100 MG capsule, Take 1 capsule (100 mg total) by mouth 2 (two) times daily., Disp: 10 capsule, Rfl: 0 ?  HYDROmorphone (DILAUDID) 4 MG tablet, Take 4 mg by mouth every 4 (four) hours as needed for severe pain., Disp: , Rfl:  ?  hydrOXYzine (ATARAX) 25 MG tablet, Take 1 tablet (25 mg total) by mouth every 8 (eight) hours as needed., Disp: 60 tablet, Rfl: 5 ?  lamoTRIgine (LAMICTAL) 100 MG tablet, Take 3 tablets (300 mg total) by mouth daily. Two tablets QAM, 1 tablet QPM, Disp: 90 tablet, Rfl: 0 ?  levothyroxine (SYNTHROID) 100 MCG tablet, TAKE 1 TABLET BY MOUTH DAILY BEFORE BREAKFAST., Disp: 90 tablet, Rfl: 1 ?  Lurasidone HCl (LATUDA) 60 MG TABS, Take 1 tablet (60 mg total) by mouth daily., Disp: 90 tablet, Rfl: 0 ?  methocarbamol (ROBAXIN) 500 MG tablet, Take 1 tablet (500 mg total) by mouth every 6 (six) hours as needed for muscle spasms., Disp: 40 tablet, Rfl: 0 ?  omeprazole (PRILOSEC) 40 MG capsule, Take 1 capsule (40 mg total) by mouth 2 (two) times daily., Disp: 180 capsule, Rfl: 1 ?  polyethylene glycol (MIRALAX / GLYCOLAX) 17 g packet, Take 17 g by mouth daily as needed for mild constipation., Disp: 14 each, Rfl: 0 ?  pravastatin (PRAVACHOL) 40 MG tablet, Take 1 tablet (40 mg total) by mouth daily., Disp: 90 tablet, Rfl: 3 ?  QUEtiapine (SEROQUEL) 50 MG tablet, Take one half tablet 25 mg for 7 days, then take one whole tablet 50 mg total daily., Disp: 30 tablet, Rfl: 3 ?  traZODone (DESYREL) 150 MG tablet, Take 3 tablets (450 mg total) by mouth at bedtime., Disp: 90 tablet, Rfl: 2 ?  Vitamin D, Ergocalciferol, (DRISDOL) 1.25 MG (50000 UNIT) CAPS capsule, Take 1 capsule (50,000 Units total) by mouth every 7 (seven) days. Sunday, Disp: 12 capsule, Rfl: 3 ? ?Current Facility-Administered  Medications:  ?  cyanocobalamin ((VITAMIN B-12)) injection 1,000 mcg, 1,000 mcg, Intramuscular, Q30 days, Shawnee Knapp, MD, 1,000 mcg at 07/12/18 1652 ?Medication Side Effects: none ? ?Family Medical/ Social History: Changes? No ? ?MENTAL HEALTH EXAM: ? ?There were no vitals taken for this visit.There is no height or weight on file to calculate BMI.  ?General Appearance: Casual, Neat, and Well Groomed  ?Eye Contact:  Good  ?Speech:  Clear and Coherent  ?Volume:  Normal  ?Mood:  NA  ?Affect:  Appropriate  ?Thought Process:  Coherent  ?Orientation:  Full (Time, Place, and Person)  ?Thought Content: Logical   ?Suicidal Thoughts:  No  ?Homicidal Thoughts:  No  ?Memory:  WNL  ?Judgement:  Good  ?Insight:  Good  ?Psychomotor Activity:  Normal  ?Concentration:  Concentration: Good  ?Recall:  Good  ?Fund of Knowledge: Good  ?Language: Good  ?Assets:  Desire for Improvement  ?ADL's:  Intact  ?Cognition: WNL  ?Prognosis:  Good  ? ? ?DIAGNOSES:  ?  ICD-10-CM   ?1. Melancholy  F32.A sertraline (ZOLOFT) 50 MG tablet  ?  QUEtiapine (SEROQUEL) 50 MG tablet  ?  ?2. Fatigue, unspecified type  R53.83 sertraline (ZOLOFT) 50 MG tablet  ?  ?3. Bipolar disorder, in partial remission, most recent episode depressed (HCC)  F31.75   ?  ?4. Generalized anxiety disorder  F41.1 sertraline (ZOLOFT) 50 MG tablet  ?  ALPRAZolam (XANAX) 1 MG tablet  ?  hydrOXYzine (ATARAX) 25 MG tablet  ?  QUEtiapine (SEROQUEL) 50 MG tablet  ?  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 F99   ?  ?6. Bipolar 1 disorder, mixed, moderate (HCC)  F31.62 QUEtiapine (SEROQUEL) 50 MG tablet  ?  ? ? ?Receiving Psychotherapy: No  ? ? ?RECOMMENDATIONS:  ? ? Greater than 50% of 30  min of face to face time  was spent on counseling and coordination of care. She complains of continued depression. She had to stop [name] because of the cost She is requesting low dose anti depressant to seek if it helps.  ?Continue Seroquel 50 mg daily at  bedtime ?Continue hydroxyzine 25 mg nightly 8H as needed. ?Continue Lamictal  300 mg daily. 200 mg in the am and 100 mg in the evening. ?To start Zoloft 25 mg daily for 7 days, then 50 mg daily. ?Continue trazodone 300 mg, 1.5 pills nightly.   ?Return in 4 weeks to reassess.  ?Discussed potential benefits, risks, and side effects of stimulants with patient to include increased heart rate, palpitations, insomnia, increased anxiety, increased irritability, or decreased appetite.  Instructed patient to contact office if experiencing any significant tolerability issues.  ?Discussed potential metabolic side effects associated with atypical antipsychotics, as well as potential risk for movement side effects. Advised pt to contact office if movement side effects occur.   ?  PDMP was reviewed ? ? ? ? ? ?Elwanda Brooklyn, NP  ?

## 2021-12-21 ENCOUNTER — Telehealth: Payer: Self-pay | Admitting: Pharmacist

## 2021-12-21 NOTE — Progress Notes (Signed)
? ? ?Chronic Care Management ?Pharmacy Assistant  ? ?Name: Becky Lawson  MRN: [id number] DOB: 07/22/1951 ? ? ?Reason for Encounter: Monthly Medication Coordination Call  ?  ? ? ?Medications: ?Outpatient Encounter Medications as of 12/21/2021  ?Medication Sig  ? ALPRAZolam (XANAX) 1 MG tablet TAKE 1/2 to 1 TABLET BY MOUTH TWICE A DAY AS NEEDED FOR ANXIETY  ? amLODipine (NORVASC) 5 MG tablet Take 1 tablet (5 mg total) by mouth daily.  ? celecoxib (CELEBREX) 200 MG capsule Take 1 capsule (200 mg total) by mouth 2 (two) times daily. (Patient not taking: Reported on 12/16/2021)  ? Cholecalciferol (VITAMIN D-3) 5000 units TABS Take 5,000 Units daily by mouth. In addition to the rx high dose once weekly vitamin D of 50Ku.  ? dextroamphetamine (DEXTROSTAT) 10 MG tablet TAKE ONE TABLET BY MOUTH EVERY MORNING  ? docusate sodium (COLACE) 100 MG capsule Take 1 capsule (100 mg total) by mouth 2 (two) times daily.  ? HYDROmorphone (DILAUDID) 4 MG tablet Take 4 mg by mouth every 4 (four) hours as needed for severe pain.  ? hydrOXYzine (ATARAX) 25 MG tablet Take 1 tablet (25 mg total) by mouth every 8 (eight) hours as needed.  ? lamoTRIgine (LAMICTAL) 100 MG tablet Take 3 tablets (300 mg total) by mouth daily. Two tablets QAM, 1 tablet QPM  ? levothyroxine (SYNTHROID) 100 MCG tablet TAKE 1 TABLET BY MOUTH DAILY BEFORE BREAKFAST.  ? Lurasidone HCl (LATUDA) 60 MG TABS Take 1 tablet (60 mg total) by mouth daily.  ? methocarbamol (ROBAXIN) 500 MG tablet Take 1 tablet (500 mg total) by mouth every 6 (six) hours as needed for muscle spasms.  ? omeprazole (PRILOSEC) 40 MG capsule Take 1 capsule (40 mg total) by mouth 2 (two) times daily.  ? polyethylene glycol (MIRALAX / GLYCOLAX) 17 g packet Take 17 g by mouth daily as needed for mild constipation.  ? pravastatin (PRAVACHOL) 40 MG tablet Take 1 tablet (40 mg total) by mouth daily.  ? QUEtiapine (SEROQUEL) 50 MG tablet Take one half tablet 25 mg for 7 days, then take one whole tablet 50 mg  total daily.  ? sertraline (ZOLOFT) 50 MG tablet Take 1/2 tablet 25 mg for 7 days, then take one whole tablet 50 mg total daily.  ? traZODone (DESYREL) 150 MG tablet Take 3 tablets (450 mg total) by mouth at bedtime.  ? Vitamin D, Ergocalciferol, (DRISDOL) 1.25 MG (50000 UNIT) CAPS capsule Take 1 capsule (50,000 Units total) by mouth every 7 (seven) days. Sunday  ? ?Facility-Administered Encounter Medications as of 12/21/2021  ?Medication Note  ? cyanocobalamin ((VITAMIN B-12)) injection 1,000 mcg 08/08/2019: Not taking  ? ? ?Reviewed chart for medication changes ahead of medication coordination call. ? ?No OVs, Consults, or hospital visits since last care coordination call/Pharmacist visit. (If appropriate, list visit date, provider name) ? ?No medication changes indicated OR if recent visit, treatment plan here. ? ?BP Readings from Last 3 Encounters:  ?08/05/21 (!) 181/73  ?07/21/21 (!) 142/82  ?07/19/21 138/66  ?  ?Lab Results  ?Component Value Date  ? HGBA1C 5.0 07/12/2018  ?  ? ?Patient obtains medications through Vials  30 days (wants to begin packaging and 90 days on next delivery once in sync) ? ?Last adherence delivery included: (medication name and frequency) ? ?levothyroxine (SYNTHROID) 100 MCG tablet 1 tablet by mouth daily 30 min prior to breakfast ?QUEtiapine (SEROQUEL) 50 MG tablet 1 tablet by mouth daily at bedtime ? ? ?Declined - plenty on  hand ?Omeprazole 40 mg (I tablet daily at bedtime) ?Lamotrigine 200 mg 1 tablet twice daily ?amlodipine (NORVASC) 5 MG tablet 1 tablet daily ?Trazodone 150 mg 3 tablet at bedtime  ?[name] (has been discontinued permanently) ?Vitamin D 50,000 1 capsule every Sunday am  ?dextroamphetamine (DEXTROSTAT) 10 MG tablet 1 tablet daily ? ? ?Patient is due for next adherence delivery on: 12/31/21. ?Called patient and reviewed medications and coordinated delivery. ? ?This delivery to include: ? ?QUEtiapine (SEROQUEL) 50 MG tablet 1 tablet by mouth daily at bedtime ?Trazodone  150 mg 2 tablet at bedtime  ?Vitamin D 50,000 1 capsule every Sunday am  ?Alprazolam 1 mg 1 tablet at bedtime as needed ?Pravstatin '40mg'$  1 tablet daily ? ?Patient declined the following medications (meds) due to (reason) ?Omeprazole 40 mg (I tablet daily at bedtime) ?Lamotrigine 200 mg 1 tablet twice daily ?dextroamphetamine (DEXTROSTAT) 10 MG tablet 1 tablet daily ?levothyroxine (SYNTHROID) 100 MCG tablet 1 tablet by mouth daily 30 min prior to breakfast ?amlodipine (NORVASC) 5 MG tablet 1 tablet daily ? ? ?Patient needs refills for: None noted on medsync form. ? ?Confirmed delivery date of 12/31/21, advised patient that pharmacy will contact them the morning of delivery. ? ? ?Care Gaps ?  ?AWV: unknown  ?Colonoscopy: unknown  ?DM Eye Exam: N/A ?DM Foot Exam: N/A ?Microalbumin: N/A ?HbgAIC: N/A ?DEXA: done 09/17/15 ?Mammogram: done 03/25/21 ?  ?  ?Star Rating Drugs: ?pravastatin (PRAVACHOL) 40 MG tablet - last filled 09/29/21 66 days ? ? ? ?Future Appointments  ?Date Time Provider [place]  ?01/12/2022  3:00 PM [facility] CCM PHARMACIST [facility]  ?01/17/2022  2:30 PM White, Louanna Raw, NP [facility] None  ?01/24/2022  2:20 PM Wendie Agreste, MD [facility]  ? ? ? ?Liza Showfety, CCMA ?Clinical Pharmacist Assistant  ?[phone] ? ? ?

## 2021-12-22 NOTE — Addendum Note (Signed)
Addended by: Edythe Clarity on: 12/22/2021 02:25 PM ? ? Modules accepted: Orders ? ?

## 2021-12-24 ENCOUNTER — Telehealth: Payer: Self-pay | Admitting: Behavioral Health

## 2021-12-24 ENCOUNTER — Other Ambulatory Visit: Payer: Self-pay | Admitting: Behavioral Health

## 2021-12-24 DIAGNOSIS — F411 Generalized anxiety disorder: Secondary | ICD-10-CM

## 2021-12-24 DIAGNOSIS — F5105 Insomnia due to other mental disorder: Secondary | ICD-10-CM

## 2021-12-24 DIAGNOSIS — F32A Depression, unspecified: Secondary | ICD-10-CM

## 2021-12-24 MED ORDER — TRAZODONE HCL 150 MG PO TABS: 300.0000 mg | ORAL_TABLET | Freq: Every day | ORAL | 1 refills | Status: DC

## 2021-12-24 NOTE — Telephone Encounter (Signed)
Becky Lawson from [facility] regarding paitent's prescription for Trazadone. States that the prescription dosage was decreased from [name] taking three tablets at night before to two tablets before bed. Would like confirmation that this is the correct dosage and a new prescription for that sent over. Pls call [phone].  ?

## 2021-12-24 NOTE — Telephone Encounter (Signed)
See message - pharmacy asking for a new script to reflect the current dosing.  ?

## 2021-12-24 NOTE — Telephone Encounter (Signed)
New RX sent. Please let them know that the correct dosage now is Trazodone 300 mg total at bedtime. She will be taking two 150 mg tablets.

## 2021-12-27 ENCOUNTER — Telehealth: Payer: Self-pay

## 2021-12-27 NOTE — Telephone Encounter (Signed)
She is not our pt,send msg back to sender. ?

## 2021-12-28 ENCOUNTER — Other Ambulatory Visit: Payer: Self-pay | Admitting: Behavioral Health

## 2021-12-28 ENCOUNTER — Other Ambulatory Visit: Payer: Self-pay

## 2021-12-28 DIAGNOSIS — R5383 Other fatigue: Secondary | ICD-10-CM

## 2021-12-28 DIAGNOSIS — R7989 Other specified abnormal findings of blood chemistry: Secondary | ICD-10-CM

## 2021-12-28 DIAGNOSIS — F32A Depression, unspecified: Secondary | ICD-10-CM

## 2021-12-28 MED ORDER — VITAMIN D (ERGOCALCIFEROL) 1.25 MG (50000 UNIT) PO CAPS: 50000.0000 [IU] | ORAL_CAPSULE | ORAL | 3 refills | Status: DC

## 2021-12-28 NOTE — Telephone Encounter (Signed)
Last filled 4/5, due 5/3  ?

## 2022-01-10 ENCOUNTER — Ambulatory Visit (INDEPENDENT_AMBULATORY_CARE_PROVIDER_SITE_OTHER): Payer: PPO | Admitting: Family Medicine

## 2022-01-10 ENCOUNTER — Encounter: Payer: Self-pay | Admitting: Family Medicine

## 2022-01-10 VITALS — BP 134/72 | HR 98 | Temp 98.0°F | Resp 16 | Ht 64.5 in | Wt 191.2 lb

## 2022-01-10 DIAGNOSIS — N760 Acute vaginitis: Secondary | ICD-10-CM

## 2022-01-10 MED ORDER — FLUCONAZOLE 150 MG PO TABS: ORAL_TABLET | ORAL | 0 refills | Status: DC

## 2022-01-10 NOTE — Patient Instructions (Signed)
Follow up as needed or as scheduled ?START the Diflucan and then repeat it in 3 days ?USE the Monistat cream twice daily on the red, itchy areas ?AVOID the wipes and just pat dry w/ toilet paper ?Call with any questions or concerns- particularly if not improving ?Hang in there!! ?

## 2022-01-10 NOTE — Progress Notes (Signed)
? ?  Subjective:  ? ? Patient ID: Becky Lawson, female    DOB: [birth year], 71 y.o.   MRN: [id number] ? ?HPI ?Rash- pt reports she has had rash for ~1 week.  Prior to rash, had a cyst/boil that ruptured- 'it was really big'   Area is now very itchy and this has spread from R side to L side and 'all around my vagina.  No vaginal d/c.  Area is a bit raw from scratching.  Has been using wipes rather than toilet paper when possible but states that they burn. ? ? ?Review of Systems ?For ROS see HPI  ?   ?Objective:  ? Physical Exam ?Vitals reviewed. Exam conducted with a chaperone present.  ?Constitutional:   ?   General: She is not in acute distress. ?   Appearance: Normal appearance. She is not ill-appearing.  ?HENT:  ?   Head: Normocephalic and atraumatic.  ?Pulmonary:  ?   Effort: Pulmonary effort is normal. No respiratory distress.  ?Genitourinary: ?   Pubic Area: No rash or pubic lice.   ?   Labia:     ?   Right: No tenderness.     ?   Left: No tenderness.   ?   Urethra: No prolapse.  ? ? ?Neurological:  ?   General: No focal deficit present.  ?   Mental Status: She is alert and oriented to person, place, and time.  ?Psychiatric:     ?   Mood and Affect: Mood normal.     ?   Behavior: Behavior normal.  ? ? ? ? ? ?   ?Assessment & Plan:  ? ?Vulvovaginitis- new.  No vaginal d/c present but labia minora and majora are erythematous and this is the area that pt indicates is itching.  No evidence of cyst or boil at this time.  Will start oral Diflucan x2 doses 3 days apart and topical monistat to see if sxs improve.  If not, may need GYN referral for further investigation.  Pt expressed understanding and is in agreement w/ plan.  ?

## 2022-01-10 NOTE — Progress Notes (Signed)
? ?Chronic Care Management ?Pharmacy Note ? ?01/12/2022 ?Name:  Becky Lawson MRN:  [id number] DOB:  14-Mar-1951 ? ?Summary: ?FU with PharmD.  Switched Latuda to [employer].  She has been on this for about 1 month now and reports it is working well so far. ?Reports BP is elevated at home. ? ?Recommendations: ?Bring BP logs into PCP visit on 5/22. ?Fasting lipids - increase statin intensity if still elevated. ? ? ?Subjective: ?Becky Lawson is an 71 y.o. year old female who is a primary patient of [name], Ranell Patrick, MD.  The CCM team was consulted for assistance with disease management and care coordination needs.   ? ?Engaged with patient by telephone for follow up visit in response to provider referral for pharmacy case management and/or care coordination services.  ? ?Consent to Services:  ?The patient was given the following information about Chronic Care Management services today, agreed to services, and gave verbal consent: 1. CCM service includes personalized support from designated clinical staff supervised by the primary care provider, including individualized plan of care and coordination with other care providers 2. 24/7 contact phone numbers for assistance for urgent and routine care needs. 3. Service will only be billed when office clinical staff spend 20 minutes or more in a month to coordinate care. 4. Only one practitioner may furnish and bill the service in a calendar month. 5.The patient may stop CCM services at any time (effective at the end of the month) by phone call to the office staff. 6. The patient will be responsible for cost sharing (co-pay) of up to 20% of the service fee (after annual deductible is met). Patient agreed to services and consent obtained. ? ?Patient Care Team: ?Wendie Agreste, MD as PCP - General (Family Medicine) ?Comer Locket, PA-C as Librarian, academic (Psychiatry) ?Marty Heck, MD as Consulting Physician (Gastroenterology) ?Adrian Prows, MD as Consulting Physician  (Cardiology) ?Renato Shin, MD as Consulting Physician (Endocrinology) ?Greer Pickerel, MD as Consulting Physician (General Surgery) ?Suella Broad, MD as Consulting Physician (Physical Medicine and Rehabilitation) ?Edythe Clarity, [facility] (Pharmacist) ? ?Recent office visits:  ?07/19/21 Merri Ray, MD (PCP) - Family Medicine - Hypertension - labs were ordered. Referral placed for General Surgery. Flu vaccine administered in office. Follow up in 6 months.  ?  ?04/19/21 Merri Ray, MD - Family Medicine - Fatigue - fluticasone (FLONASE) 50 MCG/ACT nasal spray prescribed. Follow up as scheduled.  ?  ?Recent consult visits:  ?08/13/21 Lesle Chris, NP - Behavioral Health - Fatigue - No medication changes. Follow up as scheduled. ?  ?07/08/21 Levy Pupa - No notes available ?  ?06/24/21 Lesle Chris, NP - Behavioral Health - Fatigue - No medication changes. Follow up as scheduled.  ?  ?05/31/21 Paralee Cancel, MD - Orthopedics - No notes available ?  ?05/27/21 Lesle Chris, NP - Behavioral Health - Fatigue - No medication changes. Follow up as scheduled. ?  ?05/07/21 Levy Pupa - Pain in left hip - No notes available.  ?  ?  ?Hospital visits: 08/03/21 - 08/05/21 ?Medication Reconciliation was completed by comparing discharge summary, patient?s EMR and Pharmacy list, and upon discussion with patient. ?  ?Admitted to the hospital on 08/03/21 due to Left  hip osteoarthritis. Discharge date was 08/05/21. Discharged from [facility].   ?  ?New?Medications Started at [facility] Discharge:?? ?acetaminophen (TYLENOL) ?aspirin ?celecoxib (CELEBREX) ?docusate sodium (COLACE) ?methocarbamol (ROBAXIN) ?polyethylene glycol (MIRALAX / GLYCOLAX) ?  ?Medication Changes at Hospital Discharge: ?None noted ?  ?Medications Discontinued  at Hospital Discharge: ?HYDROcodone-acetaminophen 10-325 MG tablet (NORCO) ?  ?Medications that remain the same after Hospital Discharge:??  ?All other medications will remain the same.    ? ? ?Objective: ? ?Lab Results  ?Component Value Date  ? CREATININE 0.93 08/04/2021  ? BUN 14 08/04/2021  ? GFR 68.61 01/14/2021  ? GFRNONAA >60 08/04/2021  ? GFRAA 80 04/30/2020  ? NA 136 08/04/2021  ? K 4.0 08/04/2021  ? CALCIUM 8.3 (L) 08/04/2021  ? CO2 26 08/04/2021  ? GLUCOSE 201 (H) 08/04/2021  ? ? ?Lab Results  ?Component Value Date/Time  ? HGBA1C 5.0 07/12/2018 04:48 PM  ? HGBA1C 5.0 02/21/2018 04:25 PM  ? GFR 68.61 01/14/2021 02:43 PM  ?  ?Last diabetic Eye exam: No results found for: HMDIABEYEEXA  ?Last diabetic Foot exam: No results found for: HMDIABFOOTEX  ? ?Lab Results  ?Component Value Date  ? CHOL 192 01/14/2021  ? HDL 50.70 01/14/2021  ? LDLCALC 112 (H) 01/14/2021  ? TRIG 150.0 (H) 01/14/2021  ? CHOLHDL 4 01/14/2021  ? ? ? ?  Latest Ref Rng & Units 07/21/2021  ?  2:38 PM 01/14/2021  ?  2:43 PM 04/30/2020  ?  2:36 PM  ?Hepatic Function  ?Total Protein 6.5 - 8.1 g/dL 7.0   7.1   6.4    ?Albumin 3.5 - 5.0 g/dL 4.4   4.7   4.4    ?AST 15 - 41 U/L 16   17   17     ?ALT 0 - 44 U/L 10   10   12     ?Alk Phosphatase 38 - 126 U/L 74   64   84    ?Total Bilirubin 0.3 - 1.2 mg/dL 0.5   0.3   0.2    ? ? ?Lab Results  ?Component Value Date/Time  ? TSH 1.55 01/14/2021 02:43 PM  ? TSH 1.160 04/30/2020 02:36 PM  ? FREET4 1.61 02/21/2018 04:25 PM  ? ? ? ?  Latest Ref Rng & Units 08/05/2021  ?  2:53 AM 08/04/2021  ?  3:19 AM 07/21/2021  ?  2:38 PM  ?CBC  ?WBC 4.0 - 10.5 K/uL 13.4   13.0   5.9    ?Hemoglobin 12.0 - 15.0 g/dL 10.1   10.5   13.7    ?Hematocrit 36.0 - 46.0 % 30.0   31.7   42.2    ?Platelets 150 - 400 K/uL 223   230   289    ? ? ?Lab Results  ?Component Value Date/Time  ? VD25OH 50.16 01/14/2021 02:43 PM  ? VD25OH 55.0 04/30/2020 02:36 PM  ? VD25OH 26.0 (L) 09/30/2019 12:52 PM  ? ? ?Clinical ASCVD: No  ?The 10-year ASCVD risk score (Arnett DK, et al., 2019) is: 23.4% ?  Values used to calculate the score: ?    Age: 15 years ?    Sex: Female ?    Is Non-Hispanic African American: No ?    Diabetic: No ?     Tobacco smoker: Yes ?    Systolic Blood Pressure: 038 mmHg ?    Is BP treated: Yes ?    HDL Cholesterol: 50.7 mg/dL ?    Total Cholesterol: 192 mg/dL   ? ? ?  01/10/2022  ?  2:50 PM 12/16/2021  ?  4:01 PM 12/16/2021  ?  4:00 PM  ?Depression screen PHQ 2/9  ?Decreased Interest 0 0 0  ?Down, Depressed, Hopeless 0 0 0  ?PHQ - 2 Score 0 0  0  ?Altered sleeping 0    ?Tired, decreased energy 0    ?Change in appetite 0    ?Feeling bad or failure about yourself  0    ?Trouble concentrating 0    ?Moving slowly or fidgety/restless 0    ?Suicidal thoughts 0    ?PHQ-9 Score 0    ?Difficult doing work/chores Not difficult at all    ?  ? ?Social History  ? ?Tobacco Use  ?Smoking Status Former  ? Packs/day: 1.00  ? Years: 15.00  ? Pack years: 15.00  ? Types: Cigarettes  ? Quit date: 06/30/1979  ? Years since quitting: 42.5  ?Smokeless Tobacco Never  ? ?BP Readings from Last 3 Encounters:  ?01/10/22 134/72  ?08/05/21 (!) 181/73  ?07/21/21 (!) 142/82  ? ?Pulse Readings from Last 3 Encounters:  ?01/10/22 98  ?08/05/21 99  ?07/21/21 96  ? ?Wt Readings from Last 3 Encounters:  ?01/10/22 191 lb 3.2 oz (86.7 kg)  ?08/03/21 193 lb (87.5 kg)  ?07/21/21 193 lb (87.5 kg)  ? ?BMI Readings from Last 3 Encounters:  ?01/10/22 32.31 kg/m?  ?08/03/21 32.62 kg/m?  ?07/21/21 32.62 kg/m?  ? ? ?Assessment/Interventions: Review of patient past medical history, allergies, medications, health status, including review of consultants reports, laboratory and other test data, was performed as part of comprehensive evaluation and provision of chronic care management services.  ? ?SDOH:  (Social Determinants of Health) assessments and interventions performed: Yes ? ?Financial Resource Strain: Low Risk   ? Difficulty of Paying Living Expenses: Not hard at all  ? ? ?SDOH Screenings  ? ?Alcohol Screen: Low Risk   ? Last Alcohol Screening Score (AUDIT): 0  ?Depression (PHQ2-9): Low Risk   ? PHQ-2 Score: 0  ?Financial Resource Strain: Low Risk   ? Difficulty of Paying  Living Expenses: Not hard at all  ?Food Insecurity: No Food Insecurity  ? Worried About Charity fundraiser in the Last Year: Never true  ? Ran Out of Food in the Last Year: Never true  ?Housing: Low Risk   ? Last Housing Risk

## 2022-01-11 ENCOUNTER — Other Ambulatory Visit: Payer: Self-pay | Admitting: Behavioral Health

## 2022-01-11 DIAGNOSIS — R5383 Other fatigue: Secondary | ICD-10-CM

## 2022-01-11 DIAGNOSIS — F32A Depression, unspecified: Secondary | ICD-10-CM

## 2022-01-11 DIAGNOSIS — F411 Generalized anxiety disorder: Secondary | ICD-10-CM

## 2022-01-12 ENCOUNTER — Ambulatory Visit (INDEPENDENT_AMBULATORY_CARE_PROVIDER_SITE_OTHER): Payer: PPO | Admitting: Pharmacist

## 2022-01-12 DIAGNOSIS — E785 Hyperlipidemia, unspecified: Secondary | ICD-10-CM

## 2022-01-12 DIAGNOSIS — I10 Essential (primary) hypertension: Secondary | ICD-10-CM

## 2022-01-12 NOTE — Patient Instructions (Addendum)
Visit Information ? ? Goals Addressed   ? ?  ?  ?  ?  ? This Visit's Progress  ?  Track and Manage My Blood Pressure-Hypertension   On track  ?  Timeframe:  Long-Range Goal ?Priority:  High ?Start Date: 09/09/21                            ?Expected End Date: 03/09/22                     ? ?Follow Up Date 12/08/21  ?  ?- check blood pressure 3 times per week ?- choose a place to take my blood pressure (home, clinic or office, retail store) ?- write blood pressure results in a log or diary  ?  ?Why is this important?   ?You won't feel high blood pressure, but it can still hurt your blood vessels.  ?High blood pressure can cause heart or kidney problems. It can also cause a stroke.  ?Making lifestyle changes like losing a little weight or eating less salt will help.  ?Checking your blood pressure at home and at different times of the day can help to control blood pressure.  ?If the doctor prescribes medicine remember to take it the way the doctor ordered.  ?Call the office if you cannot afford the medicine or if there are questions about it.   ?  ?Notes:  ?  ? ?  ? ?Patient Care Plan: General Pharmacy (Adult)  ?  ? ?Problem Identified: HTN, GERD, HLD, Bipolar, Pre-DM, Hypothyroidism   ?Priority: High  ?Onset Date: 09/09/2021  ?  ? ?Long-Range Goal: Patient-Specific Goal   ?Start Date: 09/09/2021  ?Expected End Date: 03/09/2022  ?Recent Progress: On track  ?Priority: High  ?Note:   ?Current Barriers:  ?Elevated LDL ?Unable to achieve control of BP  ? ?Pharmacist Clinical Goal(s):  ?Patient will verbalize ability to afford treatment regimen ?achieve control of BP as evidenced by monitoring ?adhere to plan to optimize therapeutic regimen for LDL as evidenced by report of adherence to recommended medication management changes through collaboration with PharmD and provider.  ? ?Interventions: ?1:1 collaboration with Wendie Agreste, MD regarding development and update of comprehensive plan of care as evidenced by provider  attestation and co-signature ?Inter-disciplinary care team collaboration (see longitudinal plan of care) ?Comprehensive medication review performed; medication list updated in electronic medical record ? 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 ?Update 01/12/22 ?BP fairly controlled in office yesterday.   ?Denies any dizziness or HA.  She has not recorded but has been monitoring at home and reports some elevated readings around 147W systolic. ?Have asked her to monitor and record, bring records in for  Dr. Vonna Kotyk appointment on the 22nd. ?If elevated at that time, would recommend increase amlodipine to '10mg'$  daily. ? ?Hyperlipidemia: (LDL goal < 70) ?-Not ideally controlled ?-Current treatment: ?Pravastatin '40mg'$  daily - Query Appropriate ?-Medications previously tried: none noted  ?-Current dietary patterns: see DM ?-Current exercise habits: minimal ?-Educated on Cholesterol goals;  ?Benefits of statin for ASCVD risk reduction; ?Importance of limiting foods high in cholesterol; ?Exercise goal of 150 minutes per week; ?-Recommended  to continue current medication ?Patient with ASCVD risk score of 36.1% - high risk ?Her most recent LDL is elevated at 112, she is having no adverse effects to statins ?Would qualify for high intensity statin such as Lipitor '40mg'$  daily ?Recommend recheck LDL at next OV - if still elevated consider switch to above ? ?Update 01/12/22 ?Due for fasting lipid panel. ?Last lipid panel May 2022.  Would recommend increasing to high intensity statin if LDL still not at goal. ?She said she could fast until appointment that afternoon. ?She has been taking pravastatin daily with no missed doses. ?No changes at this time, continue current medications until fasting labs. ? ? ?Pre-Diabetes (A1c goal <6.5%) ?-Controlled ?-Current medications: ?None ?-Medications previously tried: none noted  ?-Current home glucose readings ?fasting glucose: not checking ?post prandial glucose: not checking ?-Denies hypoglycemic/hyperglycemic symptoms ?-Current meal patterns:  ?breakfast: tangerine  ?lunch: frozen sandwich  ?dinner: frozen meal, sometimes home cooked meal from a neighbor ?snacks:  ?drinks: water, coffee ?-Current exercise: minimal ?-Educated on A1c and blood sugar goals; ?Exercise goal of 150 minutes per week; ?Benefits of weight loss; ?High salt content of frozen ready meals ?-Counseled to check feet daily and get yearly eye exams ?-Recommended to continue current medication ?Work on eliminating her frozen meals from diet ? ?Bipolar (Goal: Minimize symptoms) ?-Controlled - followed by psychology ?-Current treatment: ?Zoloft '50mg'$  daily Appropriate, Query effective, ?Lamotrigine '200mg'$  Bid Appropriate, Effective, Safe, Accessible ?-Medications previously tried/failed: Cymbalta, Seroquel ?-PHQ9:  ?PHQ9 SCORE ONLY 07/19/2021 05/27/2021 04/19/2021  ?PHQ-9 Total Score '7 17 13  '$ ?-Educated on Benefits of medication for symptom control ?Possibility for high copay on Latuda ?-Recommended to continue current medication ?Assessed patient  finances. Worried about Latuda copay - will determine at first fill this year and then address ?Recommend continue current meds - will help with copay assistance if possible ?Has follow up with psychology soon ? ?GERD (Goal: Has follow) ?-Controlled ?-Current treatment  ?Omeprazole '20mg'$  daily Appropriate, Effective, Query Safe ?-Medications previously tried: none noted  ?-Patient mentions they sometimes only use once daily ?Discussed appropriate timing of medication ?She does have symptoms if she does not take ?-Could consider step down based on length of therapy, will continue to discuss at further visits ? ?Hypothyroidism (Goal: Maintain TSH) ?-Controlled ?-Current treatment  ?Levothyroxine 149mg daily Appropriate, Effective, Safe, Accessible ?-Medications previously tried: none noted ?-Most recent TSH is normal - takes med at appropriate time ?-Recommended to continue current medication ? ?Patient Goals/Self-Care Activities ?Patient will:  ?- check blood pressure daily, document, and provide at future appointments ?target a minimum of 150 minutes of moderate intensity exercise weekly ?engage in dietary modifications by trying to cut back on salt content in diet from frozen foods ? ?Follow Up Plan: The care management team will reach out to the patient again over the next 120 days.  ? ?  ? ?  ?  ? ?The patient verbalized understanding of instructions, educational materials, and care plan provided today and declined offer to receive copy of patient instructions, educational materials, and care  plan.  ?Telephone follow up appointment with pharmacy team member scheduled for: 4 months ? ?Edythe Clarity, [facility]  ?Beverly Milch, PharmD ?Clinical Pharmacist  ?[place] ?[phone] ?

## 2022-01-17 ENCOUNTER — Ambulatory Visit (INDEPENDENT_AMBULATORY_CARE_PROVIDER_SITE_OTHER): Payer: Self-pay | Admitting: Behavioral Health

## 2022-01-17 DIAGNOSIS — F489 Nonpsychotic mental disorder, unspecified: Secondary | ICD-10-CM

## 2022-01-17 NOTE — Progress Notes (Signed)
Pt did not show for scheduled appt. No 24 hr notice as required or message. Fee assessed. ?

## 2022-01-19 ENCOUNTER — Other Ambulatory Visit: Payer: Self-pay | Admitting: Behavioral Health

## 2022-01-20 ENCOUNTER — Telehealth: Payer: Self-pay | Admitting: Pharmacist

## 2022-01-20 NOTE — Progress Notes (Cosign Needed)
Chronic Care Management Pharmacy Assistant    Name: Becky Lawson  MRN: [id number] DOB: 06-20-1951   Reason for Encounter: Medication Coordination Call      Medications: Outpatient Encounter Medications as of 01/20/2022  Medication Sig   lamoTRIgine (LAMICTAL) 100 MG tablet TAKE TWO TABLETS BY MOUTH EVERY MORNING and TAKE ONE TABLET BY MOUTH EVERY EVENING   ALPRAZolam (XANAX) 1 MG tablet TAKE 1/2 to 1 TABLET BY MOUTH TWICE A DAY AS NEEDED FOR ANXIETY   amLODipine (NORVASC) 5 MG tablet Take 1 tablet (5 mg total) by mouth daily.   celecoxib (CELEBREX) 200 MG capsule Take 1 capsule (200 mg total) by mouth 2 (two) times daily. (Patient not taking: Reported on 12/16/2021)   Cholecalciferol (VITAMIN D-3) 5000 units TABS Take 5,000 Units daily by mouth. In addition to the rx high dose once weekly vitamin D of 50Ku.   dextroamphetamine (DEXTROSTAT) 10 MG tablet TAKE ONE TABLET BY MOUTH EVERY MORNING   docusate sodium (COLACE) 100 MG capsule Take 1 capsule (100 mg total) by mouth 2 (two) times daily.   fluconazole (DIFLUCAN) 150 MG tablet Take 1 today and then the 2nd pill in 3 days   HYDROmorphone (DILAUDID) 4 MG tablet Take 4 mg by mouth every 4 (four) hours as needed for severe pain.   hydrOXYzine (ATARAX) 25 MG tablet Take 1 tablet (25 mg total) by mouth every 8 (eight) hours as needed.   levothyroxine (SYNTHROID) 100 MCG tablet TAKE 1 TABLET BY MOUTH DAILY BEFORE BREAKFAST.   methocarbamol (ROBAXIN) 500 MG tablet Take 1 tablet (500 mg total) by mouth every 6 (six) hours as needed for muscle spasms.   omeprazole (PRILOSEC) 40 MG capsule Take 1 capsule (40 mg total) by mouth 2 (two) times daily.   polyethylene glycol (MIRALAX / GLYCOLAX) 17 g packet Take 17 g by mouth daily as needed for mild constipation.   pravastatin (PRAVACHOL) 40 MG tablet Take 1 tablet (40 mg total) by mouth daily.   QUEtiapine (SEROQUEL) 50 MG tablet Take one half tablet 25 mg for 7 days, then take one whole tablet 50  mg total daily.   sertraline (ZOLOFT) 50 MG tablet Take 1/2 tablet 25 mg for 7 days, then take one whole tablet 50 mg total daily.   traZODone (DESYREL) 150 MG tablet Take 2 tablets (300 mg total) by mouth at bedtime.   Vitamin D, Ergocalciferol, (DRISDOL) 1.25 MG (50000 UNIT) CAPS capsule Take 1 capsule (50,000 Units total) by mouth every 7 (seven) days. Sunday   Facility-Administered Encounter Medications as of 01/20/2022  Medication Note   cyanocobalamin ((VITAMIN B-12)) injection 1,000 mcg 08/08/2019: Not taking    Reviewed chart for medication changes ahead of medication coordination call.  No OVs, Consults, or hospital visits since last care coordination call/Pharmacist visit. (If appropriate, list visit date, provider name)  No medication changes indicated OR if recent visit, treatment plan here.  BP Readings from Last 3 Encounters:  01/10/22 134/72  08/05/21 (!) 181/73  07/21/21 (!) 142/82    Lab Results  Component Value Date   HGBA1C 5.0 07/12/2018     Patient obtains medications through Adherence Packaging  90 Days    Last adherence delivery included: (medication name and frequency)  QUEtiapine (SEROQUEL) 50 MG tablet 1 tablet by mouth daily at bedtime Trazodone 150 mg 2 tablet at bedtime  Vitamin D 50,000 1 capsule every Sunday am  Alprazolam 1 mg 1 tablet at bedtime as needed Pravstatin '40mg'$  1 tablet  daily   Patient declined the following medications (meds) due to (reason) Omeprazole 40 mg (I tablet daily at bedtime) Lamotrigine 200 mg 1 tablet twice daily dextroamphetamine (DEXTROSTAT) 10 MG tablet 1 tablet daily levothyroxine (SYNTHROID) 100 MCG tablet 1 tablet by mouth daily 30 min prior to breakfast amlodipine (NORVASC) 5 MG tablet 1 tablet daily  Patient is due for next adherence delivery on: 02/01/22. Called patient and reviewed medications and coordinated delivery.  This delivery to include: QUEtiapine (SEROQUEL) 50 MG tablet 1 tablet by mouth daily at  bedtime Trazodone 150 mg 2 tablet at bedtime  Vitamin D 50,000 1 capsule every Sunday am  Alprazolam 1 mg 1 tablet at bedtime as needed (in vial) Pravastatin '40mg'$  1 tablet daily Omeprazole 40 mg (I tablet daily at bedtime) Lamotrigine 200 mg 1 tablet twice daily (in vial) Levothyroxine (SYNTHROID) 100 MCG tablet 1 tablet by mouth daily 30 min prior to breakfast Amlodipine (NORVASC) 5 MG tablet 1 tablet daily Hydroxyzine '25mg'$  q8h prn   Patient needs refills for: Amlodipine 5 mg tablet 1 daily for 90 day supply and Levothyroxine 100 mg 1 tablet daily for 90 day supply  Confirmed delivery date of 02/01/22, advised patient that pharmacy will contact them the morning of delivery.   Care Gaps   AWV: unknown  Colonoscopy: unknown  DM Eye Exam: N/A DM Foot Exam: N/A Microalbumin: N/A HbgAIC: N/A DEXA: done 09/17/15 Mammogram: done 03/25/21     Star Rating Drugs: Pravastatin (PRAVACHOL) 40 MG tablet - last filled 4/24/233 30 days    Future Appointments  Date Time Provider [place]  01/24/2022  2:20 PM Wendie Agreste, MD [facility]  02/02/2022  3:00 PM Elwanda [place], NP [facility] None  05/18/2022  2:00 PM [facility] PHARMACIST [facility] [place], [place] Clinical Pharmacist Assistant  [phone]

## 2022-01-24 ENCOUNTER — Ambulatory Visit (INDEPENDENT_AMBULATORY_CARE_PROVIDER_SITE_OTHER): Payer: PPO | Admitting: Family Medicine

## 2022-01-24 ENCOUNTER — Ambulatory Visit: Payer: PPO | Admitting: Family Medicine

## 2022-01-24 VITALS — BP 156/72 | HR 86 | Temp 98.1°F | Resp 16 | Ht 64.5 in | Wt 186.0 lb

## 2022-01-24 DIAGNOSIS — E559 Vitamin D deficiency, unspecified: Secondary | ICD-10-CM | POA: Diagnosis not present

## 2022-01-24 DIAGNOSIS — I10 Essential (primary) hypertension: Secondary | ICD-10-CM

## 2022-01-24 DIAGNOSIS — E785 Hyperlipidemia, unspecified: Secondary | ICD-10-CM | POA: Diagnosis not present

## 2022-01-24 DIAGNOSIS — N760 Acute vaginitis: Secondary | ICD-10-CM

## 2022-01-24 DIAGNOSIS — E890 Postprocedural hypothyroidism: Secondary | ICD-10-CM

## 2022-01-24 DIAGNOSIS — R7989 Other specified abnormal findings of blood chemistry: Secondary | ICD-10-CM | POA: Diagnosis not present

## 2022-01-24 MED ORDER — AMLODIPINE BESYLATE 2.5 MG PO TABS: 2.5000 mg | ORAL_TABLET | Freq: Every day | ORAL | 1 refills | Status: DC

## 2022-01-24 NOTE — Progress Notes (Unsigned)
Subjective:  Patient ID: Becky Lawson, female    DOB: August 07, 1951  Age: 71 y.o. MRN: [id number]  CC:  Chief Complaint  Patient presents with   Hypertension    Pt here for recheck no concerns    Hyperlipidemia    Due for refills no concerns pt is fasting today    Thyroid Nodule    Due for recheck no concerns     HPI Becky Lawson presents for   Vulvovaginitis Treated by my colleague on May 8.  No vaginal discharge at that time but labia minora and majora are both erythematous in the area of itching.  No cyst or boil.  Treated with oral Diflucan x2 3 days apart and topical Monistat with RTC/GYN follow-up precautions.  Irritation cleared. Back to normal.  Off dilaudid, now back to percocet for spinal stenosis - Dr. [name].  Will be seeing TMJ specialist.  Plans to follow up for throat clearing and possible other non urgent concerns.   Last vitamin D Lab Results  Component Value Date   VD25OH 50.16 01/14/2021  Taking 50k units per week RX.   Hypertension: Norvasc '5mg'$  qd.  Home readings:145/74, 156/80.  Took today's meds about an hour ago.  No CP/dyspnea.  BP Readings from Last 3 Encounters:  01/24/22 (!) 156/72  01/10/22 134/72  08/05/21 (!) 181/73   Lab Results  Component Value Date   CREATININE 0.93 08/04/2021   Hyperlipidemia: Pravastatin '40mg'$  qd. Due for labs - fasting.  Lab Results  Component Value Date   CHOL 192 01/14/2021   HDL 50.70 01/14/2021   LDLCALC 112 (H) 01/14/2021   TRIG 150.0 (H) 01/14/2021   CHOLHDL 4 01/14/2021   Lab Results  Component Value Date   ALT 10 07/21/2021   AST 16 07/21/2021   ALKPHOS 74 07/21/2021   BILITOT 0.5 07/21/2021    Hypothyroidism: Lab Results  Component Value Date   TSH 1.55 01/14/2021  Taking medication daily. Synthroid 141mg qd.  Some heat intolerance. No new hair or skin changes, heart palpitations or new fatigue - same fatigue. Circumstantial depression - followed be psychiatry. On zoloft past month. . No new  weight changes.   Thyroid [place]2/23/23 -  IMPRESSION: 1. Postsurgical changes of left thyroidectomy. No evidence of recurrence within the thyroidectomy bed. No enlarged neck lymph nodes. 2. 5 mm periphery calcified right mid thyroid nodule is unchanged from prior examination and does not meet criteria for FNA or further follow-up. Followed by [facility] plan follow up as needed.   History 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   Chronic throat clearing 06/24/2016   S/P partial thyroidectomy 06/16/2016   Multinodular goiter 06/02/2016   Thyroid nodule 02/08/2016   Hx of hepatitis C 05/19/2015   Vitamin D deficiency 05/19/2015   Spinal stenosis of lumbar region 05/19/2015   Hyperlipidemia 05/19/2015   Bipolar disorder [place] 05/19/2015   Essential hypertension 09/26/2013   Undiagnosed cardiac  murmurs 09/26/2013   Past Medical History:  Diagnosis Date   Arthritis    Benign cyst of right kidney 2014   2.7 cm right kidney cyst seen on imaging 2014 but was c/o right flank pain with new persistent proteinuria (fortunately hematuria had resolved) so repeat US 11/2016 showed right kidney still with 2.6 cm simple cyst and otherwise nml.   Bipolar 1 disorder ([place])    Cancer ([place])    Phreesia 06/15/2020   Chronic kidney disease    Depression    Depression    Phreesia 06/15/2020   GERD (gastroesophageal reflux disease)     Heart murmur    Hepatitis C    resolved completely after treatment in 2014, genotype 1b, [facility]   Hyperlipidemia    Hypertension    Hypothyroidism    Jaundice 11/01/2012   Pruritic disorder 02/13/2013   Sleep apnea    Was diagnosed approximately 20 years ago, but does not wear CPAP patient stated "I gave it back.Marland KitchenMarland KitchenI couldn't wear that thing it was horrible"   Spinal stenosis    Thyroid disease    Phreesia 06/15/2020   Past Surgical History:  Procedure Laterality Date   ABDOMINAL HYSTERECTOMY     COLONOSCOPY W/ POLYPECTOMY     JOINT REPLACEMENT Bilateral    knee   KNEE ARTHROSCOPY Bilateral    PARTIAL HYSTERECTOMY     THYROID LOBECTOMY Left 06/16/2016   THYROID LOBECTOMY Left 06/16/2016   Procedure: LEFT THYROID LOBECTOMY;  Surgeon: Greer Pickerel, MD;  Location: [facility];  Service: General;  Laterality: Left;   TOTAL HIP ARTHROPLASTY Left 08/03/2021   Procedure: TOTAL HIP ARTHROPLASTY ANTERIOR APPROACH;  Surgeon: Paralee Cancel, MD;  Location: [facility];  Service: Orthopedics;  Laterality: Left;   No Known Allergies Prior to Admission medications   Medication Sig Start Date End Date Taking? Authorizing Provider  ALPRAZolam Duanne Moron) 1 MG tablet TAKE 1/2 to 1 TABLET BY MOUTH TWICE A DAY AS NEEDED FOR ANXIETY 12/20/21  Yes White, Brian A, NP  amLODipine (NORVASC) 5 MG tablet Take 1 tablet (5 mg total) by mouth daily. 09/23/21  Yes Wendie Agreste, MD  dextroamphetamine (DEXTROSTAT) 10 MG tablet TAKE ONE TABLET BY MOUTH EVERY MORNING 01/05/22  Yes Lesle Chris A, NP  docusate sodium (COLACE) 100 MG capsule Take 1 capsule (100 mg total) by mouth 2 (two) times daily. 08/04/21  Yes Irving Copas, PA-C  hydrOXYzine (ATARAX) 25 MG tablet Take 1 tablet (25 mg total) by mouth every 8 (eight) hours as needed. 12/20/21  Yes White, Louanna Raw, NP  lamoTRIgine (LAMICTAL) 100 MG tablet TAKE TWO TABLETS BY MOUTH EVERY MORNING and TAKE ONE TABLET BY MOUTH EVERY EVENING 01/20/22  Yes White, Brian A,  NP  levothyroxine (SYNTHROID) 100 MCG tablet TAKE 1 TABLET BY MOUTH DAILY BEFORE BREAKFAST. 09/23/21  Yes Wendie Agreste, MD  omeprazole (PRILOSEC) 40 MG capsule Take 1 capsule (40 mg total) by mouth 2 (two) times daily. 09/23/21  Yes Wendie Agreste, MD  polyethylene glycol (MIRALAX / GLYCOLAX) 17 g packet Take 17 g by mouth daily as needed for mild constipation. 08/04/21  Yes Irving Copas, PA-C  pravastatin (PRAVACHOL) 40 MG tablet Take 1 tablet (40 mg total) by mouth daily. 09/23/21  Yes Wendie Agreste, MD  QUEtiapine (SEROQUEL) 50 MG tablet Take one half tablet 25 mg for 7 days, then take one whole tablet 50 mg total daily. 12/20/21  Yes Elwanda Brooklyn, NP  sertraline (ZOLOFT)  50 MG tablet Take 1/2 tablet 25 mg for 7 days, then take one whole tablet 50 mg total daily. 12/20/21  Yes Elwanda Brooklyn, NP  traZODone (DESYREL) 150 MG tablet Take 2 tablets (300 mg total) by mouth at bedtime. 12/24/21  Yes White, Louanna Raw, NP  Vitamin D, Ergocalciferol, (DRISDOL) 1.25 MG (50000 UNIT) CAPS capsule Take 1 capsule (50,000 Units total) by mouth every 7 (seven) days. Sunday 12/28/21  Yes Wendie Agreste, MD  celecoxib (CELEBREX) 200 MG capsule Take 1 capsule (200 mg total) by mouth 2 (two) times daily. Patient not taking: Reported on 12/16/2021 08/04/21   Irving Copas, PA-C  Cholecalciferol (VITAMIN D-3) 5000 units TABS Take 5,000 Units daily by mouth. In addition to the rx high dose once weekly vitamin D of 50Ku. Patient not taking: Reported on 01/24/2022 07/24/17   Shawnee Knapp, MD  fluconazole (DIFLUCAN) 150 MG tablet Take 1 today and then the 2nd pill in 3 days 01/10/22   Midge Minium, MD  HYDROmorphone (DILAUDID) 4 MG tablet Take 4 mg by mouth every 4 (four) hours as needed for severe pain.    [provider]  methocarbamol (ROBAXIN) 500 MG tablet Take 1 tablet (500 mg total) by mouth every 6 (six) hours as needed for muscle spasms. 08/04/21   Irving Copas, PA-C   Social  History   Socioeconomic History   Marital status: Divorced    Spouse name: Not on file   Number of children: Not on file   Years of education: Not on file   Highest education level: Not on file  Occupational History   Occupation: Personal Caregiver  Tobacco Use   Smoking status: Former    Packs/day: 1.00    Years: 15.00    Pack years: 15.00    Types: Cigarettes    Quit date: 06/30/1979    Years since quitting: 42.6   Smokeless tobacco: Never  Vaping Use   Vaping Use: Never used  Substance and Sexual Activity   Alcohol use: Not Currently    Alcohol/week: 0.0 - 1.0 standard drinks   Drug use: No   Sexual activity: Yes    Birth control/protection: None  Other Topics Concern   Not on file  Social History Narrative   Divorced. Education: [employer]. Exercise: No.   Social Determinants of Health   Financial Resource Strain: Low Risk    Difficulty of Paying Living Expenses: Not hard at all  Food Insecurity: No Food Insecurity   Worried About Charity fundraiser in the Last Year: Never true   Ran Out of Food in the Last Year: Never true  Transportation Needs: No Transportation Needs   Lack of Transportation (Medical): No   Lack of Transportation (Non-Medical): No  Physical Activity: Insufficiently Active   Days of Exercise per Week: 2 days   Minutes of Exercise per Session: 60 min  Stress: No Stress Concern Present   Feeling of Stress : Not at all  Social Connections: Moderately Isolated   Frequency of Communication with Friends and Family: Twice a week   Frequency of Social Gatherings with Friends and Family: Twice a week   Attends Religious Services: More than 4 times per year   Active Member of [employer] or Organizations: No   Attends Archivist Meetings: Never   Marital Status: Divorced  Human resources officer Violence: Not At Risk   Fear of Current or Ex-Partner: No   Emotionally Abused: No   Physically Abused: No  Sexually Abused: No    Review of Systems Per  HPI.   Objective:   Vitals:   01/24/22 1431 01/24/22 1436  BP: (!) 162/78 (!) 156/72  Pulse: 86   Resp: 16   Temp: 98.1 F (36.7 C)   TempSrc: Temporal   SpO2: 96%   Weight: 186 lb (84.4 kg)   Height: 5' 4.5" (1.638 m)      Physical Exam Vitals reviewed.  Constitutional:      Appearance: Normal appearance. She is well-developed.  HENT:     Head: Normocephalic and atraumatic.  Eyes:     Conjunctiva/sclera: Conjunctivae normal.     Pupils: Pupils are equal, round, and reactive to light.  Neck:     Vascular: No carotid bruit.  Cardiovascular:     Rate and Rhythm: Normal rate and regular rhythm.     Heart sounds: Normal heart sounds.  Pulmonary:     Effort: Pulmonary effort is normal.     Breath sounds: Normal breath sounds.  Abdominal:     Palpations: Abdomen is soft. There is no pulsatile mass.     Tenderness: There is no abdominal tenderness.  Musculoskeletal:     Right lower leg: No edema.     Left lower leg: No edema.  Skin:    General: Skin is warm and dry.  Neurological:     Mental Status: She is alert and oriented to person, place, and time.  Psychiatric:        Mood and Affect: Mood normal.        Behavior: Behavior normal.       Assessment & Plan:  Becky Lawson is a 71 y.o. female . Primary hypertension - Plan: amLODipine (NORVASC) 2.5 MG tablet  -Decreased control.  We will add additional 2.5 mg amlodipine for total dose 7.5 mg daily with home monitoring, RTC precautions, check labs  Hyperlipidemia, unspecified hyperlipidemia type - Plan: Comprehensive metabolic panel, Lipid panel   -Tolerating current dose statin, continue same with updated labs, adjust medications accordingly  Vulvovaginitis  -Resolved after treatment above, RTC precautions if recurs.  Vitamin D deficiency - Plan: Vitamin D (25 hydroxy) Low serum vitamin D  -Currently on prescription strength dosing, check labs.  Adjust dose accordingly  Postoperative hypothyroidism - Plan:  TSH  -With stable thyroid nodule as above.  Check TSH.  Has had some longstanding fatigue, potentially medication related, less likely thyroid related but can discuss further if needed based on lab results.    has other nonurgent concerns that she plans to schedule separate visit to discuss with RTC precautions if any acute changes.  Meds ordered this encounter  Medications   amLODipine (NORVASC) 2.5 MG tablet    Sig: Take 1 tablet (2.5 mg total) by mouth daily. Add to '5mg'$  for total daily dose 7.'5mg'$     Dispense:  90 tablet    Refill:  1   Patient Instructions  Thanks for coming in today.  Try adding an additional 2.5 mg of amlodipine to 5 mg from the total dose of 7.5 mg/day.  Let me know how that dose is working over the next few weeks.  No other med changes for now.  I will check some labs.  Please follow-up to discuss the throat clearing at first available appointment. Return to the clinic or go to the nearest emergency room if any of your symptoms worsen or new symptoms occur.     Signed,   Merri Ray, MD [facility] [place]  [facility] 01/24/22 3:08 PM

## 2022-01-24 NOTE — Patient Instructions (Signed)
Thanks for coming in today.  Try adding an additional 2.5 mg of amlodipine to 5 mg from the total dose of 7.5 mg/day.  Let me know how that dose is working over the next few weeks.  No other med changes for now.  I will check some labs.  Please follow-up to discuss the throat clearing at first available appointment. Return to the clinic or go to the nearest emergency room if any of your symptoms worsen or new symptoms occur.

## 2022-01-25 ENCOUNTER — Other Ambulatory Visit: Payer: Self-pay | Admitting: Behavioral Health

## 2022-01-25 ENCOUNTER — Encounter: Payer: Self-pay | Admitting: Family Medicine

## 2022-01-25 DIAGNOSIS — F32A Depression, unspecified: Secondary | ICD-10-CM

## 2022-01-25 DIAGNOSIS — R5383 Other fatigue: Secondary | ICD-10-CM

## 2022-01-25 LAB — LIPID PANEL
Cholesterol: 196 mg/dL (ref 0–200)
HDL: 54.4 mg/dL (ref >39.00)
LDL Cholesterol: 119 mg/dL — ABNORMAL HIGH (ref 0–99)
NonHDL: 141.47
Total CHOL/HDL Ratio: 4
Triglycerides: 113 mg/dL (ref 0.0–149.0)
VLDL: 22.6 mg/dL (ref 0.0–40.0)

## 2022-01-25 LAB — COMPREHENSIVE METABOLIC PANEL
ALT: 8 U/L (ref 0–35)
AST: 14 U/L (ref 0–37)
Albumin: 4.7 g/dL (ref 3.5–5.2)
Alkaline Phosphatase: 88 U/L (ref 39–117)
BUN: 16 mg/dL (ref 6–23)
CO2: 28 mEq/L (ref 19–32)
Calcium: 9.8 mg/dL (ref 8.4–10.5)
Chloride: 105 mEq/L (ref 96–112)
Creatinine, Ser: 0.74 mg/dL (ref 0.40–1.20)
GFR: 81.58 mL/min (ref >60.00)
Glucose, Bld: 109 mg/dL — ABNORMAL HIGH (ref 70–99)
Potassium: 4.7 mEq/L (ref 3.5–5.1)
Sodium: 141 mEq/L (ref 135–145)
Total Bilirubin: 0.5 mg/dL (ref 0.2–1.2)
Total Protein: 6.8 g/dL (ref 6.0–8.3)

## 2022-01-25 LAB — TSH: TSH: 0.76 u[IU]/mL (ref 0.35–5.50)

## 2022-01-25 LAB — VITAMIN D 25 HYDROXY (VIT D DEFICIENCY, FRACTURES): VITD: 48.84 ng/mL (ref 30.00–100.00)

## 2022-01-26 ENCOUNTER — Other Ambulatory Visit: Payer: Self-pay | Admitting: Family Medicine

## 2022-01-26 DIAGNOSIS — E890 Postprocedural hypothyroidism: Secondary | ICD-10-CM

## 2022-01-26 NOTE — Telephone Encounter (Signed)
See if patient has checked availability.

## 2022-01-28 NOTE — Telephone Encounter (Signed)
Due 5/31

## 2022-02-01 DIAGNOSIS — Z79891 Long term (current) use of opiate analgesic: Secondary | ICD-10-CM | POA: Diagnosis not present

## 2022-02-01 DIAGNOSIS — M5136 Other intervertebral disc degeneration, lumbar region: Secondary | ICD-10-CM | POA: Diagnosis not present

## 2022-02-02 ENCOUNTER — Ambulatory Visit: Payer: PPO | Admitting: Behavioral Health

## 2022-02-02 ENCOUNTER — Encounter: Payer: Self-pay | Admitting: Behavioral Health

## 2022-02-02 DIAGNOSIS — F99 Mental disorder, not otherwise specified: Secondary | ICD-10-CM | POA: Diagnosis not present

## 2022-02-02 DIAGNOSIS — E039 Hypothyroidism, unspecified: Secondary | ICD-10-CM | POA: Diagnosis not present

## 2022-02-02 DIAGNOSIS — I10 Essential (primary) hypertension: Secondary | ICD-10-CM | POA: Diagnosis not present

## 2022-02-02 DIAGNOSIS — R5383 Other fatigue: Secondary | ICD-10-CM | POA: Diagnosis not present

## 2022-02-02 DIAGNOSIS — F32A Depression, unspecified: Secondary | ICD-10-CM | POA: Diagnosis not present

## 2022-02-02 DIAGNOSIS — F319 Bipolar disorder, unspecified: Secondary | ICD-10-CM

## 2022-02-02 DIAGNOSIS — F3175 Bipolar disorder, in partial remission, most recent episode depressed: Secondary | ICD-10-CM | POA: Diagnosis not present

## 2022-02-02 DIAGNOSIS — F5105 Insomnia due to other mental disorder: Secondary | ICD-10-CM

## 2022-02-02 DIAGNOSIS — Z87891 Personal history of nicotine dependence: Secondary | ICD-10-CM | POA: Diagnosis not present

## 2022-02-02 DIAGNOSIS — E785 Hyperlipidemia, unspecified: Secondary | ICD-10-CM

## 2022-02-02 DIAGNOSIS — F411 Generalized anxiety disorder: Secondary | ICD-10-CM

## 2022-02-02 MED ORDER — MIRTAZAPINE 7.5 MG PO TABS: ORAL_TABLET | ORAL | 0 refills | Status: DC

## 2022-02-02 NOTE — Telephone Encounter (Signed)
Has appt with Brian today 

## 2022-02-02 NOTE — Progress Notes (Signed)
[facility]  Patient ID: ELEONOR OCON,  MRN: [id number]  PCP: Wendie Agreste, MD  Date of Evaluation: 02/02/2022 Time spent:30 minutes  Chief Complaint:  Chief Complaint   Anxiety; Depression; Follow-up; Medication Refill     HISTORY/CURRENT STATUS: HPI JEANEEN CALA presents for follow-up and medication management. She says that she is experiencing some mania. Stayed up all night last night reading a book. She feels like she is getting fine tremors in both upper extremities bilaterally. Says she is going to f/u with PCP and talk about neurology. Says her anxiety today is 3/10 and depression is 3/10. She was sleeping  7 hours per night with aid of medication but last night slept 0. She denies mania, no psychosis, NO SI/HI.   Past medications for mental health diagnoses include: Lithium, Seroquel, Effexor, Prozac, Latuda, Abilify, Dexedrine, Cymbalta caused weight gain.  Modafinil.     Individual Medical History/ Review of Systems: Changes? :No   Allergies: Patient has no known allergies.  Current Medications:  Current Outpatient Medications:    mirtazapine (REMERON) 7.5 MG tablet, Take 1-2 tablets (15 mg total) at bedtime as needed for sleep., Disp: 60 tablet, Rfl: 0   ALPRAZolam (XANAX) 1 MG tablet, TAKE 1/2 to 1 TABLET BY MOUTH TWICE A DAY AS NEEDED FOR ANXIETY, Disp: 45 tablet, Rfl: 3   amLODipine (NORVASC) 2.5 MG tablet, Take 1 tablet (2.5 mg total) by mouth daily. Add to '5mg'$  for total daily dose 7.'5mg'$ , Disp: 90 tablet, Rfl: 1   amLODipine (NORVASC) 5 MG tablet, Take 1 tablet (5 mg total) by mouth daily., Disp: 90 tablet, Rfl: 1   dextroamphetamine (DEXTROSTAT) 10 MG tablet, TAKE ONE TABLET BY MOUTH EVERY MORNING, Disp: 30 tablet, Rfl: 0   docusate sodium (COLACE) 100 MG capsule, Take 1 capsule (100 mg total) by mouth 2 (two) times daily., Disp: 10 capsule, Rfl: 0   hydrOXYzine (ATARAX) 25 MG tablet, Take 1 tablet (25 mg total) by mouth every 8 (eight) hours as  needed., Disp: 60 tablet, Rfl: 5   lamoTRIgine (LAMICTAL) 100 MG tablet, TAKE TWO TABLETS BY MOUTH EVERY MORNING and TAKE ONE TABLET BY MOUTH EVERY EVENING, Disp: 270 tablet, Rfl: 0   levothyroxine (SYNTHROID) 100 MCG tablet, TAKE ONE TABLET BY MOUTH daily BEFORE breakfast, Disp: 90 tablet, Rfl: 1   omeprazole (PRILOSEC) 40 MG capsule, Take 1 capsule (40 mg total) by mouth 2 (two) times daily., Disp: 180 capsule, Rfl: 1   polyethylene glycol (MIRALAX / GLYCOLAX) 17 g packet, Take 17 g by mouth daily as needed for mild constipation., Disp: 14 each, Rfl: 0   pravastatin (PRAVACHOL) 40 MG tablet, Take 1 tablet (40 mg total) by mouth daily., Disp: 90 tablet, Rfl: 3   QUEtiapine (SEROQUEL) 50 MG tablet, Take one half tablet 25 mg for 7 days, then take one whole tablet 50 mg total daily., Disp: 30 tablet, Rfl: 3   sertraline (ZOLOFT) 50 MG tablet, Take 1/2 tablet 25 mg for 7 days, then take one whole tablet 50 mg total daily., Disp: 30 tablet, Rfl: 1   traZODone (DESYREL) 150 MG tablet, Take 2 tablets (300 mg total) by mouth at bedtime., Disp: 180 tablet, Rfl: 1   Vitamin D, Ergocalciferol, (DRISDOL) 1.25 MG (50000 UNIT) CAPS capsule, Take 1 capsule (50,000 Units total) by mouth every 7 (seven) days. Sunday, Disp: 12 capsule, Rfl: 3  Current Facility-Administered Medications:    cyanocobalamin ((VITAMIN B-12)) injection 1,000 mcg, 1,000 mcg, Intramuscular, Q30 days, Brigitte Pulse,  Laurey Arrow, MD, 1,000 mcg at 07/12/18 1652 Medication Side Effects: none  Family Medical/ Social History: Changes? No  MENTAL HEALTH EXAM:  There were no vitals taken for this visit.There is no height or weight on file to calculate BMI.  General Appearance: Casual, Neat, and Well Groomed  Eye Contact:  Good  Speech:  Clear and Coherent  Volume:  Normal  Mood:  Anxious  Affect:  Anxious  Thought Process:  Coherent  Orientation:  Full (Time, Place, and Person)  Thought Content: Logical   Suicidal Thoughts:  No  Homicidal Thoughts:   No  Memory:  WNL  Judgement:  Fair  Insight:  Fair  Psychomotor Activity:  Normal  Concentration:  Concentration: Good  Recall:  [place] of Knowledge: Fair  Language: Good  Assets:  Desire for Improvement Physical Health Resilience Social Support  ADL's:  Intact  Cognition: WNL  Prognosis:  Good    DIAGNOSES:    ICD-10-CM   1. Generalized anxiety disorder  F41.1     2. Insomnia due to other mental disorder  F51.05 mirtazapine (REMERON) 7.5 MG tablet   F99     3. Melancholy  F32.A     4. Bipolar disorder, in partial remission, most recent episode depressed ([place])  F31.75     5. Fatigue, unspecified type  R53.83       Receiving Psychotherapy: No    RECOMMENDATIONS:   Greater than 50% of 30  min of face to face time  was spent on counseling and coordination of care. She complains of continued depression. She had to stop [name] because of the cost She is requesting low dose anti depressant to seek if it helps.  Conducted AIMS  Score of 2. It is my opinion that the movements appear to be rhythmic in nature and parkinsonian like. Most noticeable in the tongue and upper extremities. She currently is not on any high dose of antipsychotics but generous amount of Lamictal. There are more rare cases of anti seizure medication causing TD. She said her abnormal movements get worse when she does not sleep but her father had Parkinson's disease and it runs in family. She said she will be following up with neurology. Will continue to observe and reassess in following visits.   Continue Seroquel 50 mg daily at bedtime Continue hydroxyzine 25 mg nightly 8H as needed. Continue Lamictal  300 mg daily. 200 mg in the am and 100 mg in the evening. Continue Zoloft 50 mg daily. Stop the  trazodone 300 mg, 1.5 pills nightly. To try Mirtazapine 7.5 mg at bedtime. May increase to 15 mg if needed.  Return in 4 weeks to reassess.  Discussed potential benefits, risks, and side effects of  stimulants with patient to include increased heart rate, palpitations, insomnia, increased anxiety, increased irritability, or decreased appetite.  Instructed patient to contact office if experiencing any significant tolerability issues.  Discussed potential metabolic side effects associated with atypical antipsychotics, as well as potential risk for movement side effects. Advised pt to contact office if movement side effects occur.   PDMP was reviewed      Elwanda Brooklyn, NP

## 2022-02-03 ENCOUNTER — Other Ambulatory Visit: Payer: Self-pay | Admitting: Behavioral Health

## 2022-02-03 ENCOUNTER — Telehealth: Payer: Self-pay | Admitting: Behavioral Health

## 2022-02-03 ENCOUNTER — Telehealth: Payer: Self-pay

## 2022-02-03 DIAGNOSIS — F5105 Insomnia due to other mental disorder: Secondary | ICD-10-CM

## 2022-02-03 NOTE — Telephone Encounter (Signed)
Patient lvm stating she was in the office on yesterday, and was in a bad way. The medication that was discussed she is requesting clarification. Contact information # [phone].

## 2022-02-03 NOTE — Telephone Encounter (Signed)
Given to [name] to review. Once reviewed then the progress note will be scanned into the chart.

## 2022-02-03 NOTE — Telephone Encounter (Signed)
LVM to rtc 

## 2022-02-03 NOTE — Telephone Encounter (Signed)
Spoke to pt

## 2022-02-09 ENCOUNTER — Encounter: Payer: Self-pay | Admitting: Family Medicine

## 2022-02-09 ENCOUNTER — Ambulatory Visit (INDEPENDENT_AMBULATORY_CARE_PROVIDER_SITE_OTHER): Payer: PPO | Admitting: Family Medicine

## 2022-02-09 VITALS — BP 144/70 | HR 76 | Temp 98.1°F | Resp 16 | Ht 64.5 in | Wt 191.0 lb

## 2022-02-09 DIAGNOSIS — G250 Essential tremor: Secondary | ICD-10-CM | POA: Diagnosis not present

## 2022-02-09 DIAGNOSIS — R0989 Other specified symptoms and signs involving the circulatory and respiratory systems: Secondary | ICD-10-CM

## 2022-02-09 DIAGNOSIS — W57XXXA Bitten or stung by nonvenomous insect and other nonvenomous arthropods, initial encounter: Secondary | ICD-10-CM | POA: Diagnosis not present

## 2022-02-09 DIAGNOSIS — R0982 Postnasal drip: Secondary | ICD-10-CM

## 2022-02-09 DIAGNOSIS — S90862A Insect bite (nonvenomous), left foot, initial encounter: Secondary | ICD-10-CM

## 2022-02-09 MED ORDER — AZELASTINE HCL 0.1 % NA SOLN: 1.0000 | Freq: Two times a day (BID) | NASAL | 3 refills | Status: DC

## 2022-02-09 NOTE — Progress Notes (Signed)
Subjective:  Patient ID: Becky Lawson, female    DOB: 1951/03/25  Age: 71 y.o. MRN: [id number]  CC:  Chief Complaint  Patient presents with   throat clearing    Pt notes has spent 2-3 years feeling very mucousy in her throat, notes has a constant need to clear her throat, no noted sinus congestion, no pain in throat but this has become bothersome    Tremors    Pt reports have discussed tremors in past and would like to finally see neuro for tremors hoping this can be done today but understands acute visit    Tick Removal    Pt had tick apx 1 week ago removed no pain but reports small redness and wanted to ensure this was healing well no other concerns with this     HPI Becky Lawson presents for  Throat clearing Longstanding symptoms for years. Discussed in August, recommended Flonase nasal spray to see if postnasal drip contributing. No change  History of thyroid nodule, had discussed the frequent throat clearing with ENT previously. Had laryngoscopy in past - reportedly not concerns. Had few days without symptoms past few days, started back today. Does feel pnd in am then slight drainage No heartburn.  No fever, neck pain, wt loss.  Swallowing food and liquid ok. Some difficulty with pills in past, not recntly.  Tx: mucinex - no relief.   Tremors Discussed past few years.  Has been evaluated by neurology in the past (November 2019) essential tremor, did not have any significant parkinsonism at that time.  Could be related to medication.  Unable to have DaTscan due to significant claustrophobia.  No new medications or Sinemet given her other psychotropic medications and plan for symptomatic control of tremor.  Continued monitoring of symptoms recommended with follow-up as needed.  On dexedrine most days per week for activity.  Did try sinemet in past- no changes.  Harder to write - tremor feels worse. Same tremor, just more noticeable.   Tick bite Left foot, pulled off 5 days ago,  engorged tick, unknown how long there. redness quarter size, no target lesion. redness and itching improved since removal. No fever, other rash, headache, abdominal pain or known target lesion. Overall feels well.    History 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   Chronic throat clearing 06/24/2016   S/P partial thyroidectomy 06/16/2016   Multinodular goiter 06/02/2016   Thyroid nodule 02/08/2016   Hx of hepatitis C 05/19/2015   Vitamin D deficiency 05/19/2015   Spinal stenosis of lumbar region 05/19/2015   Hyperlipidemia 05/19/2015   Bipolar disorder ([place]) 05/19/2015   Essential hypertension 09/26/2013   Undiagnosed cardiac murmurs 09/26/2013   Past Medical History:  Diagnosis Date   Arthritis    Benign cyst of right kidney 2014   2.7 cm right kidney cyst seen on imaging 2014 but was c/o right flank pain with new persistent proteinuria (fortunately hematuria had  resolved) so repeat US 11/2016 showed right kidney still with 2.6 cm simple cyst and otherwise nml.   Bipolar 1 disorder ([place])    Cancer ([place])    Phreesia 06/15/2020   Chronic kidney disease    Depression    Depression    Phreesia 06/15/2020   GERD (gastroesophageal reflux disease)    Heart murmur    Hepatitis C    resolved completely after treatment in 2014, genotype 1b, followed at [facility]   Hyperlipidemia    Hypertension    Hypothyroidism     Jaundice 11/01/2012   Pruritic disorder 02/13/2013   Sleep apnea    Was diagnosed approximately 20 years ago, but does not wear CPAP patient stated "I gave it back.Marland KitchenMarland KitchenI couldn't wear that thing it was horrible"   Spinal stenosis    Thyroid disease    Phreesia 06/15/2020   Past Surgical History:  Procedure Laterality Date   ABDOMINAL HYSTERECTOMY     COLONOSCOPY W/ POLYPECTOMY     JOINT REPLACEMENT Bilateral    knee   KNEE ARTHROSCOPY Bilateral    PARTIAL HYSTERECTOMY     THYROID LOBECTOMY Left 06/16/2016   THYROID LOBECTOMY Left 06/16/2016   Procedure: LEFT THYROID LOBECTOMY;  Surgeon: Greer Pickerel, MD;  Location: [place];  Service: General;  Laterality: Left;   TOTAL HIP ARTHROPLASTY Left 08/03/2021   Procedure: TOTAL HIP ARTHROPLASTY ANTERIOR APPROACH;  Surgeon: Paralee Cancel, MD;  Location: [facility];  Service: Orthopedics;  Laterality: Left;   No Known Allergies Prior to Admission medications   Medication Sig Start Date End Date Taking? Authorizing Provider  ALPRAZolam Duanne Moron) 1 MG tablet TAKE 1/2 to 1 TABLET BY MOUTH TWICE A DAY AS NEEDED FOR ANXIETY 12/20/21  Yes White, Brian A, NP  amLODipine (NORVASC) 2.5 MG tablet Take 1 tablet (2.5 mg total) by mouth daily. Add to '5mg'$  for total daily dose 7.'5mg'$  01/24/22  Yes Wendie Agreste, MD  amLODipine (NORVASC) 5 MG tablet Take 1 tablet (5 mg total) by mouth daily. 09/23/21  Yes Wendie Agreste, MD  dextroamphetamine (DEXTROSTAT) 10 MG tablet TAKE ONE TABLET BY MOUTH EVERY MORNING 02/03/22  Yes Lesle Chris A, NP  docusate sodium (COLACE) 100 MG capsule Take 1 capsule (100 mg total) by mouth 2 (two) times daily. 08/04/21  Yes Irving Copas, PA-C  hydrOXYzine (ATARAX) 25 MG tablet Take 1 tablet (25 mg total) by mouth every 8 (eight) hours as needed. 12/20/21  Yes White, Louanna Raw, NP  lamoTRIgine (LAMICTAL) 100 MG tablet TAKE TWO TABLETS BY MOUTH EVERY MORNING and TAKE ONE TABLET BY MOUTH EVERY EVENING 01/20/22  Yes Lesle Chris A, NP   levothyroxine (SYNTHROID) 100 MCG tablet TAKE ONE TABLET BY MOUTH daily BEFORE breakfast 01/26/22  Yes Wendie Agreste, MD  mirtazapine (REMERON) 7.5 MG tablet TAKE 1-2 TABLETS (15 MG TOTAL) AT BEDTIME AS NEEDED FOR SLEEP. 02/04/22  Yes Elwanda Brooklyn, NP  omeprazole (PRILOSEC) 40 MG capsule Take 1 capsule (40 mg total) by mouth 2 (two) times daily. 09/23/21  Yes Wendie Agreste, MD  polyethylene glycol (MIRALAX / GLYCOLAX) 17 g packet Take 17 g by mouth daily as needed for mild constipation. 08/04/21  Yes Irving Copas, PA-C  pravastatin (PRAVACHOL) 40 MG tablet Take 1 tablet (40 mg total) by mouth daily. 09/23/21  Yes Wendie Agreste, MD  QUEtiapine (SEROQUEL) 50 MG tablet Take one half tablet 25 mg for 7 days, then take one whole tablet 50 mg total daily. 12/20/21  Yes Lesle Chris A, NP  sertraline (ZOLOFT) 50 MG tablet Take 1/2 tablet 25 mg for 7 days, then take one whole tablet 50 mg total daily. 12/20/21  Yes Elwanda Brooklyn, NP  traZODone (DESYREL) 150 MG tablet Take 2 tablets (300 mg total) by mouth at bedtime. 12/24/21  Yes White, Louanna Raw, NP  Vitamin D, Ergocalciferol, (DRISDOL) 1.25 MG (50000 UNIT) CAPS capsule Take 1 capsule (50,000 Units total) by mouth every 7 (seven) days. Sunday 12/28/21  Yes Wendie Agreste, MD   Social History   Socioeconomic History   Marital status: Divorced    Spouse name: Not on file   Number of children: Not on file   Years of education: Not on file   Highest education level: Not on file  Occupational History   Occupation: Personal Caregiver  Tobacco Use   Smoking status: Former    Packs/day: 1.00    Years: 15.00    Pack years: 15.00    Types: Cigarettes    Quit date: 06/30/1979    Years since quitting: 42.6   Smokeless tobacco: Never  Vaping Use   Vaping Use: Never used  Substance and Sexual Activity   Alcohol use: Not Currently    Alcohol/week: 0.0 - 1.0 standard drinks   Drug use: No   Sexual activity: Yes    Birth  control/protection: None  Other Topics Concern   Not on file  Social History Narrative   Divorced. Education: [employer]. Exercise: No.   Social Determinants of Health   Financial Resource Strain: Low Risk    Difficulty of Paying Living Expenses: Not hard at all  Food Insecurity: No Food Insecurity   Worried About Charity fundraiser in the Last Year: Never true   Ran Out of Food in the Last Year: Never true  Transportation Needs: No Transportation Needs   Lack of Transportation (Medical): No   Lack of Transportation (Non-Medical): No  Physical Activity: Insufficiently Active   Days of Exercise per Week: 2 days   Minutes of Exercise per Session: 60 min  Stress: No Stress Concern Present   Feeling of Stress : Not at all  Social Connections: Moderately Isolated   Frequency of Communication with Friends and Family: Twice a week   Frequency of Social Gatherings with Friends and Family: Twice a week   Attends Religious Services: More than 4 times per year   Active Member of [employer] or Organizations: No   Attends Archivist Meetings: Never   Marital Status: Divorced  Human resources officer Violence: Not At Risk   Fear of Current or Ex-Partner: No   Emotionally Abused: No   Physically Abused: No   Sexually Abused: No    Review of Systems   Objective:   Vitals:   02/09/22 1330 02/09/22 1335  BP: (!) 146/74 (!) 144/70  Pulse: 76   Resp: 16   Temp: 98.1 F (36.7 C)   TempSrc: Temporal   SpO2: 97%   Weight: 191 lb (86.6 kg)   Height: 5' 4.5" (1.638 m)      Physical Exam Vitals reviewed.  Constitutional:      Appearance: Normal appearance. She is well-developed.  HENT:     Head: Normocephalic and atraumatic.     Nose: Nose normal. No congestion or rhinorrhea.     Mouth/Throat:     Mouth: Mucous membranes are moist.     Pharynx: No oropharyngeal exudate or posterior oropharyngeal erythema.  Eyes:     Conjunctiva/sclera: Conjunctivae  normal.     Pupils: Pupils are  equal, round, and reactive to light.  Neck:     Vascular: No carotid bruit.  Cardiovascular:     Rate and Rhythm: Normal rate and regular rhythm.     Heart sounds: Murmur heard.  Pulmonary:     Effort: Pulmonary effort is normal.     Breath sounds: Normal breath sounds.  Abdominal:     Palpations: Abdomen is soft. There is no pulsatile mass.     Tenderness: There is no abdominal tenderness.  Musculoskeletal:     Right lower leg: No edema.     Left lower leg: No edema.  Skin:    General: Skin is warm and dry.     Comments: Left medial foot, small area of faint erythema, excoriation, 1 cm.  No foreign body, no tick parts, no significant induration, no discharge.  No bleeding.  No target lesion.  Neurological:     Mental Status: She is alert and oriented to person, place, and time.     Comments: Intention tremor noticed of both hands, no significant resting tremor appreciated.  Psychiatric:        Mood and Affect: Mood normal.        Behavior: Behavior normal.    Assessment & Plan:  Becky Lawson is a 71 y.o. female . Essential tremor - Plan: Ambulatory referral to Neurology  -Longstanding with some increased symptoms.  Referred back to neurologist.  Decision on further work-up, treatment per neurology.  Tick bite of left foot, initial encounter  -Small area of erythema may have been in response to attached tick/tick erythema but no target lesions and symptoms are improving after removal of tick.  No other systemic symptoms.  No signs of significant cellulitis/abscess at this time and area is improving.  Hold on antibiotics for now with RTC precautions if any target lesions or new symptoms.  Understanding expressed.  Throat clearing - Plan: azelastine (ASTELIN) 0.1 % nasal spray PND (post-nasal drip) - Plan: azelastine (ASTELIN) 0.1 % nasal spray  -Most likely cause of postnasal drip with chronic throat clearing.  Less likely reflux, unlikely tic.  Minimal change of Flonase  previously.  Try Astelin nasal spray, potential side effects discussed, consider ENT follow-up if no improvement.  Meds ordered this encounter  Medications   azelastine (ASTELIN) 0.1 % nasal spray    Sig: Place 1-2 sprays into both nostrils 2 (two) times daily. Use in each nostril as directed    Dispense:  30 mL    Refill:  3   Patient Instructions  I will refer you to neuro to discuss tremor.  Try astelin nasal spray. If throat clearing not improved in next 2 weeks, then ENT eval may be needed.  Tick bite does not look infected, and may have been take erythema but no target lesion, I do not think antibiotics needed at this time.  If any increased redness, or target lesion as we discussed, let me know right away and we will send in antibiotics.  If any worsening symptoms, recheck. Good luck.   Return to the clinic or go to the nearest emergency room if any of your symptoms worsen or new symptoms occur.  Essential Tremor A tremor is trembling or shaking that a person cannot control. Most tremors affect the hands or arms. Tremors can also affect the head, vocal cords, legs, and other parts of the body. Essential tremor is a tremor without a known cause. Usually, it occurs  while a person is trying to perform an action. It tends to get worse gradually as a person ages. What are the causes? The cause of this condition is not known, but it often runs in families. What increases the risk? You are more likely to develop this condition if: You have a family member with essential tremor. You are 100 years of age or older. What are the signs or symptoms? The main sign of a tremor is a rhythmic shaking of certain parts of your body that is uncontrolled and unintentional. You may: Have difficulty eating with a spoon or fork. Have difficulty writing. Nod your head up and down or side to side. Have a quivering voice. The shaking may: Get worse over time. Come and go. Be more noticeable on one side  of your body. Get worse due to stress, tiredness (fatigue), caffeine, and extreme heat or cold. How is this diagnosed? This condition may be diagnosed based on: Your symptoms and medical history. A physical exam. There is no single test to diagnose an essential tremor. However, your health care provider may order tests to rule out other causes of your condition. These may include: Blood and urine tests. Imaging studies of your brain, such as a CT scan or MRI. How is this treated? Treatment for essential tremor depends on the severity of the condition. Mild tremors may not need treatment if they do not affect your day-to-day life. Severe tremors may need to be treated using one or more of the following options: Medicines. Injections of a substance called botulinum toxin. Procedures such as deep brain stimulation (DBS) implantation or MRI-guided ultrasound treatment. Lifestyle changes. Occupational or physical therapy. Follow these instructions at home: Lifestyle  Do not use any products that contain nicotine or tobacco. These products include cigarettes, chewing tobacco, and vaping devices, such as e-cigarettes. If you need help quitting, ask your health care provider. Limit your caffeine intake as told by your health care provider. Try to get 8 hours of sleep each night. Find ways to manage your stress that fit your lifestyle and personality. Consider trying meditation or yoga. Try to anticipate stressful situations and allow extra time to manage them. If you are struggling emotionally with the effects of your tremor, consider working with a mental health provider. General instructions Take over-the-counter and prescription medicines only as told by your health care provider. Avoid extreme heat and extreme cold. Keep all follow-up visits. This is important. Visits may include physical therapy visits. Where to find more information [employer] of Neurological Disorders and  Stroke: [employer] Contact a health care provider if: You experience any changes in the location or intensity of your tremors. You start having a tremor after starting a new medicine. You have a tremor with other symptoms, such as: Numbness. Tingling. Pain. Weakness. Your tremor gets worse. Your tremor interferes with your daily life. You feel down, blue, or sad for at least 2 weeks in a row. Worrying about your tremor and what other people think about you interferes with your everyday life functions, including relationships, work, or school. Summary Essential tremor is a tremor without a known cause. Usually, it occurs when you are trying to perform an action. You are more likely to develop this condition if you have a family member with essential tremor. The main sign of a tremor is a rhythmic shaking of certain parts of your body that is uncontrolled and unintentional. Treatment for essential tremor depends on the severity of the condition. This  information is not intended to replace advice given to you by your health care provider. Make sure you discuss any questions you have with your health care provider. Document Revised: 06/11/2021 Document Reviewed: 06/11/2021 Elsevier Patient Education  [place], Adult Ticks are insects that draw blood for food. Most ticks live in shrubs and grassy and wooded areas. They climb onto people and animals that brush against the leaves and grasses that they rest on. Then they bite, attaching themselves to the skin. Most ticks are harmless, but some ticks may carry germs that can spread to a person through a bite and cause a disease. To reduce your risk of getting a disease from a tick bite, make sure you: Take steps to prevent tick bites. Check for ticks after being outdoors where ticks live. Watch for symptoms of disease if a tick attached to you or if you suspect a tick bite. How can I prevent tick  bites? Take these steps to help prevent tick bites when you go outdoors in an area where ticks live: Use insect repellent Use insect repellent that has DEET (20% or higher), picaridin, or IR3535 in it. Follow the instructions on the label. Use these products on: Bare skin. The top of your boots. Your pant legs. Your sleeve cuffs. For insect repellent that contains permethrin, follow the instructions on the label. Use these products on: Clothing. Boots. Outdoor gear. Tents. When you are outside Wear protective clothing. Long sleeves and long pants offer the best protection from ticks. Wear light-colored clothing so you can see ticks more easily. Tuck your pant legs into your socks. If you go walking on a trail, stay in the middle of the trail so your skin, hair, and clothing do not touch the bushes. Avoid walking through areas with long grass. Check for ticks on your clothing, hair, and skin often while you are outside, and check again before you go inside. Make sure to check the scalp, neck, armpits, waist, groin, and joint areas. These are the spots where ticks attach themselves most often. When you go indoors Check your clothing for ticks. Tumble dry clothes in a dryer on high heat for at least 10 minutes. If clothes are damp, additional time may be needed. If clothes require washing, use hot water. Examine gear and pets. Shower soon after being outdoors. Check your body for ticks. Conduct a full body check using a mirror. What is the proper way to remove a tick? If you find a tick on your body, remove it as soon as possible. Removing a tick sooner can prevent germs from passing to your body. Do not remove the tick with your bare fingers. To remove a tick that is crawling on your skin but has not bitten, use either of these methods: Go outdoors and brush the tick off. Remove the tick with tape or a lint roller. To remove a tick that is attached to your skin: Wash your hands. If you  have latex gloves, put them on. Use fine-tipped tweezers, curved forceps, or a tick-removal tool to gently grasp the tick as close to your skin and the tick's head as possible. Gently pull with a steady, upward, even pressure until the tick lets go. When removing the tick: Take care to keep the tick's head attached to its body. Do not twist or jerk the tick. This can make the tick's head or mouth parts break off and remain in the skin. Do not squeeze or crush  the tick's body. This could force disease-carrying fluids from the tick into your body. Do not try to remove a tick with heat, alcohol, petroleum jelly, or fingernail polish. Using these methods can cause the tick to salivate and regurgitate into your bloodstream, increasing your risk of getting a disease. What should I do after removing a tick? Dispose of the tick. Do not crush a tick with your fingers. Clean the bite area and your hands with soap and water, rubbing alcohol, or an iodine scrub. If an antiseptic cream or ointment is available, apply a small amount to the bite site. Wash and disinfect any instruments that you used to remove the tick. How should I dispose of a tick? To dispose of a live tick, use one of these methods: Place it in rubbing alcohol. Place it in a sealed bag or container. Wrap it tightly in tape. Flush it down the toilet. Contact a health care provider if: You have symptoms of a disease after a tick bite. Symptoms of a tick-borne disease can occur from moments after the tick bites to 30 days after a tick is removed. Symptoms include: Fever or chills. Any of these signs in the bite area: A red rash that makes a circle (bull's-eye rash) in the bite area. Redness and swelling. Headache. Muscle, joint, or bone pain. Abnormal tiredness. Numbness in your legs or difficulty walking or moving your legs. Tender, swollen lymph glands. A part of a tick breaks off and gets stuck in your skin. Get help right away  if: You are not able to remove a tick. You experience muscle weakness or paralysis. Your symptoms get worse or you experience new symptoms. You find an engorged tick on your skin and you are in an area where disease from ticks is a high risk. Summary Ticks may carry germs that can spread to a person through a bite and cause a disease. Wear protective clothing and use insect repellent to prevent tick bites. Follow the instructions on the label. If you find a tick on your body, remove it as soon as possible. If the tick is attached, do not try to remove with heat, alcohol, petroleum jelly, or fingernail polish. Remove the attached tick using fine-tipped tweezers, curved forceps, or a tick-removal tool. Gently pull with steady, upward, even pressure until the tick lets go. Do not twist or jerk the tick. Do not squeeze or crush the tick's body. If you have symptoms of a disease after being bitten by a tick, contact a health care provider. This information is not intended to replace advice given to you by your health care provider. Make sure you discuss any questions you have with your health care provider. Document Revised: 08/19/2019 Document Reviewed: 08/19/2019 Elsevier Patient Education  [place],   Merri Ray, MD [place] 02/09/22 2:16 PM

## 2022-02-09 NOTE — Patient Instructions (Addendum)
I will refer you to neuro to discuss tremor.  Try astelin nasal spray. If throat clearing not improved in next 2 weeks, then ENT eval may be needed.  Tick bite does not look infected, and may have been take erythema but no target lesion, I do not think antibiotics needed at this time.  If any increased redness, or target lesion as we discussed, let me know right away and we will send in antibiotics.  If any worsening symptoms, recheck. Good luck.   Return to the clinic or go to the nearest emergency room if any of your symptoms worsen or new symptoms occur.  Essential Tremor A tremor is trembling or shaking that a person cannot control. Most tremors affect the hands or arms. Tremors can also affect the head, vocal cords, legs, and other parts of the body. Essential tremor is a tremor without a known cause. Usually, it occurs while a person is trying to perform an action. It tends to get worse gradually as a person ages. What are the causes? The cause of this condition is not known, but it often runs in families. What increases the risk? You are more likely to develop this condition if: You have a family member with essential tremor. You are 70 years of age or older. What are the signs or symptoms? The main sign of a tremor is a rhythmic shaking of certain parts of your body that is uncontrolled and unintentional. You may: Have difficulty eating with a spoon or fork. Have difficulty writing. Nod your head up and down or side to side. Have a quivering voice. The shaking may: Get worse over time. Come and go. Be more noticeable on one side of your body. Get worse due to stress, tiredness (fatigue), caffeine, and extreme heat or cold. How is this diagnosed? This condition may be diagnosed based on: Your symptoms and medical history. A physical exam. There is no single test to diagnose an essential tremor. However, your health care provider may order tests to rule out other causes of your  condition. These may include: Blood and urine tests. Imaging studies of your brain, such as a CT scan or MRI. How is this treated? Treatment for essential tremor depends on the severity of the condition. Mild tremors may not need treatment if they do not affect your day-to-day life. Severe tremors may need to be treated using one or more of the following options: Medicines. Injections of a substance called botulinum toxin. Procedures such as deep brain stimulation (DBS) implantation or MRI-guided ultrasound treatment. Lifestyle changes. Occupational or physical therapy. Follow these instructions at home: Lifestyle  Do not use any products that contain nicotine or tobacco. These products include cigarettes, chewing tobacco, and vaping devices, such as e-cigarettes. If you need help quitting, ask your health care provider. Limit your caffeine intake as told by your health care provider. Try to get 8 hours of sleep each night. Find ways to manage your stress that fit your lifestyle and personality. Consider trying meditation or yoga. Try to anticipate stressful situations and allow extra time to manage them. If you are struggling emotionally with the effects of your tremor, consider working with a mental health provider. General instructions Take over-the-counter and prescription medicines only as told by your health care provider. Avoid extreme heat and extreme cold. Keep all follow-up visits. This is important. Visits may include physical therapy visits. Where to find more information [employer] of Neurological Disorders and Stroke: [employer] Contact a health care  provider if: You experience any changes in the location or intensity of your tremors. You start having a tremor after starting a new medicine. You have a tremor with other symptoms, such as: Numbness. Tingling. Pain. Weakness. Your tremor gets worse. Your tremor interferes with your daily life. You feel  down, blue, or sad for at least 2 weeks in a row. Worrying about your tremor and what other people think about you interferes with your everyday life functions, including relationships, work, or school. Summary Essential tremor is a tremor without a known cause. Usually, it occurs when you are trying to perform an action. You are more likely to develop this condition if you have a family member with essential tremor. The main sign of a tremor is a rhythmic shaking of certain parts of your body that is uncontrolled and unintentional. Treatment for essential tremor depends on the severity of the condition. This information is not intended to replace advice given to you by your health care provider. Make sure you discuss any questions you have with your health care provider. Document Revised: 06/11/2021 Document Reviewed: 06/11/2021 Elsevier Patient Education  [place], Adult Ticks are insects that draw blood for food. Most ticks live in shrubs and grassy and wooded areas. They climb onto people and animals that brush against the leaves and grasses that they rest on. Then they bite, attaching themselves to the skin. Most ticks are harmless, but some ticks may carry germs that can spread to a person through a bite and cause a disease. To reduce your risk of getting a disease from a tick bite, make sure you: Take steps to prevent tick bites. Check for ticks after being outdoors where ticks live. Watch for symptoms of disease if a tick attached to you or if you suspect a tick bite. How can I prevent tick bites? Take these steps to help prevent tick bites when you go outdoors in an area where ticks live: Use insect repellent Use insect repellent that has DEET (20% or higher), picaridin, or IR3535 in it. Follow the instructions on the label. Use these products on: Bare skin. The top of your boots. Your pant legs. Your sleeve cuffs. For insect repellent that contains  permethrin, follow the instructions on the label. Use these products on: Clothing. Boots. Outdoor gear. Tents. When you are outside Wear protective clothing. Long sleeves and long pants offer the best protection from ticks. Wear light-colored clothing so you can see ticks more easily. Tuck your pant legs into your socks. If you go walking on a trail, stay in the middle of the trail so your skin, hair, and clothing do not touch the bushes. Avoid walking through areas with long grass. Check for ticks on your clothing, hair, and skin often while you are outside, and check again before you go inside. Make sure to check the scalp, neck, armpits, waist, groin, and joint areas. These are the spots where ticks attach themselves most often. When you go indoors Check your clothing for ticks. Tumble dry clothes in a dryer on high heat for at least 10 minutes. If clothes are damp, additional time may be needed. If clothes require washing, use hot water. Examine gear and pets. Shower soon after being outdoors. Check your body for ticks. Conduct a full body check using a mirror. What is the proper way to remove a tick? If you find a tick on your body, remove it as soon as possible. Removing a tick  sooner can prevent germs from passing to your body. Do not remove the tick with your bare fingers. To remove a tick that is crawling on your skin but has not bitten, use either of these methods: Go outdoors and brush the tick off. Remove the tick with tape or a lint roller. To remove a tick that is attached to your skin: Wash your hands. If you have latex gloves, put them on. Use fine-tipped tweezers, curved forceps, or a tick-removal tool to gently grasp the tick as close to your skin and the tick's head as possible. Gently pull with a steady, upward, even pressure until the tick lets go. When removing the tick: Take care to keep the tick's head attached to its body. Do not twist or jerk the tick. This can  make the tick's head or mouth parts break off and remain in the skin. Do not squeeze or crush the tick's body. This could force disease-carrying fluids from the tick into your body. Do not try to remove a tick with heat, alcohol, petroleum jelly, or fingernail polish. Using these methods can cause the tick to salivate and regurgitate into your bloodstream, increasing your risk of getting a disease. What should I do after removing a tick? Dispose of the tick. Do not crush a tick with your fingers. Clean the bite area and your hands with soap and water, rubbing alcohol, or an iodine scrub. If an antiseptic cream or ointment is available, apply a small amount to the bite site. Wash and disinfect any instruments that you used to remove the tick. How should I dispose of a tick? To dispose of a live tick, use one of these methods: Place it in rubbing alcohol. Place it in a sealed bag or container. Wrap it tightly in tape. Flush it down the toilet. Contact a health care provider if: You have symptoms of a disease after a tick bite. Symptoms of a tick-borne disease can occur from moments after the tick bites to 30 days after a tick is removed. Symptoms include: Fever or chills. Any of these signs in the bite area: A red rash that makes a circle (bull's-eye rash) in the bite area. Redness and swelling. Headache. Muscle, joint, or bone pain. Abnormal tiredness. Numbness in your legs or difficulty walking or moving your legs. Tender, swollen lymph glands. A part of a tick breaks off and gets stuck in your skin. Get help right away if: You are not able to remove a tick. You experience muscle weakness or paralysis. Your symptoms get worse or you experience new symptoms. You find an engorged tick on your skin and you are in an area where disease from ticks is a high risk. Summary Ticks may carry germs that can spread to a person through a bite and cause a disease. Wear protective clothing and use  insect repellent to prevent tick bites. Follow the instructions on the label. If you find a tick on your body, remove it as soon as possible. If the tick is attached, do not try to remove with heat, alcohol, petroleum jelly, or fingernail polish. Remove the attached tick using fine-tipped tweezers, curved forceps, or a tick-removal tool. Gently pull with steady, upward, even pressure until the tick lets go. Do not twist or jerk the tick. Do not squeeze or crush the tick's body. If you have symptoms of a disease after being bitten by a tick, contact a health care provider. This information is not intended to replace advice given to  you by your health care provider. Make sure you discuss any questions you have with your health care provider. Document Revised: 08/19/2019 Document Reviewed: 08/19/2019 Elsevier Patient Education  [place].

## 2022-03-02 ENCOUNTER — Encounter: Payer: Self-pay | Admitting: Behavioral Health

## 2022-03-02 ENCOUNTER — Ambulatory Visit: Payer: PPO | Admitting: Behavioral Health

## 2022-03-02 DIAGNOSIS — R5383 Other fatigue: Secondary | ICD-10-CM

## 2022-03-02 DIAGNOSIS — F411 Generalized anxiety disorder: Secondary | ICD-10-CM

## 2022-03-02 DIAGNOSIS — F32A Depression, unspecified: Secondary | ICD-10-CM | POA: Diagnosis not present

## 2022-03-02 DIAGNOSIS — F3175 Bipolar disorder, in partial remission, most recent episode depressed: Secondary | ICD-10-CM

## 2022-03-02 MED ORDER — SERTRALINE HCL 50 MG PO TABS: ORAL_TABLET | ORAL | 1 refills | Status: DC

## 2022-03-02 NOTE — Progress Notes (Signed)
[facility]  Patient ID: Becky Lawson,  MRN: [id number]  PCP: Wendie Agreste, MD  Date of Evaluation: 03/02/2022 Time spent:30 minutes  Chief Complaint:  Chief Complaint   Anxiety; Depression; Follow-up; Medication Refill     HISTORY/CURRENT STATUS: HPI  Becky Lawson presents for follow-up and medication management. No manic episodes since last visit.  She is trying to avoid so much late night reading and staying up. She feels like she is getting fine tremors in both upper extremities bilaterally but has been consistent last couple of visits and not becoming more pronounced. Says she has neurology appt in August. Says her anxiety today is 0/10 and depression is 3/10. She was sleeping  7 hours per night with aid of medication but last night slept 0. She denies mania, no psychosis, NO SI/HI.   Past medications for mental health diagnoses include: Lithium, Seroquel, Effexor, Prozac, Latuda, Abilify, Dexedrine, Cymbalta caused weight gain.  Modafinil.      Individual Medical History/ Review of Systems: Changes? :No   Allergies: Patient has no known allergies.  Current Medications:  Current Outpatient Medications:    ALPRAZolam (XANAX) 1 MG tablet, TAKE 1/2 to 1 TABLET BY MOUTH TWICE A DAY AS NEEDED FOR ANXIETY, Disp: 45 tablet, Rfl: 3   amLODipine (NORVASC) 2.5 MG tablet, Take 1 tablet (2.5 mg total) by mouth daily. Add to '5mg'$  for total daily dose 7.'5mg'$ , Disp: 90 tablet, Rfl: 1   amLODipine (NORVASC) 5 MG tablet, Take 1 tablet (5 mg total) by mouth daily., Disp: 90 tablet, Rfl: 1   azelastine (ASTELIN) 0.1 % nasal spray, Place 1-2 sprays into both nostrils 2 (two) times daily. Use in each nostril as directed, Disp: 30 mL, Rfl: 3   dextroamphetamine (DEXTROSTAT) 10 MG tablet, TAKE ONE TABLET BY MOUTH EVERY MORNING, Disp: 30 tablet, Rfl: 0   docusate sodium (COLACE) 100 MG capsule, Take 1 capsule (100 mg total) by mouth 2 (two) times daily., Disp: 10 capsule, Rfl: 0    hydrOXYzine (ATARAX) 25 MG tablet, Take 1 tablet (25 mg total) by mouth every 8 (eight) hours as needed., Disp: 60 tablet, Rfl: 5   lamoTRIgine (LAMICTAL) 100 MG tablet, TAKE TWO TABLETS BY MOUTH EVERY MORNING and TAKE ONE TABLET BY MOUTH EVERY EVENING, Disp: 270 tablet, Rfl: 0   levothyroxine (SYNTHROID) 100 MCG tablet, TAKE ONE TABLET BY MOUTH daily BEFORE breakfast, Disp: 90 tablet, Rfl: 1   mirtazapine (REMERON) 7.5 MG tablet, TAKE 1-2 TABLETS (15 MG TOTAL) AT BEDTIME AS NEEDED FOR SLEEP., Disp: 180 tablet, Rfl: 0   omeprazole (PRILOSEC) 40 MG capsule, Take 1 capsule (40 mg total) by mouth 2 (two) times daily., Disp: 180 capsule, Rfl: 1   polyethylene glycol (MIRALAX / GLYCOLAX) 17 g packet, Take 17 g by mouth daily as needed for mild constipation., Disp: 14 each, Rfl: 0   pravastatin (PRAVACHOL) 40 MG tablet, Take 1 tablet (40 mg total) by mouth daily., Disp: 90 tablet, Rfl: 3   QUEtiapine (SEROQUEL) 50 MG tablet, Take one half tablet 25 mg for 7 days, then take one whole tablet 50 mg total daily., Disp: 30 tablet, Rfl: 3   sertraline (ZOLOFT) 50 MG tablet, Take 1/2 tablet 25 mg for 7 days, then take one whole tablet 50 mg total daily., Disp: 30 tablet, Rfl: 1   traZODone (DESYREL) 150 MG tablet, Take 2 tablets (300 mg total) by mouth at bedtime., Disp: 180 tablet, Rfl: 1   Vitamin D, Ergocalciferol, (DRISDOL) 1.25  MG (50000 UNIT) CAPS capsule, Take 1 capsule (50,000 Units total) by mouth every 7 (seven) days. Sunday, Disp: 12 capsule, Rfl: 3  Current Facility-Administered Medications:    cyanocobalamin ((VITAMIN B-12)) injection 1,000 mcg, 1,000 mcg, Intramuscular, Q30 days, Shawnee Knapp, MD, 1,000 mcg at 07/12/18 1652 Medication Side Effects: none  Family Medical/ Social History: Changes? No  MENTAL HEALTH EXAM:  There were no vitals taken for this visit.There is no height or weight on file to calculate BMI.  General Appearance: Casual and Neat  Eye Contact:  Good  Speech:  Clear and  Coherent  Volume:  Normal  Mood:  NA  Affect:  Appropriate  Thought Process:  Coherent  Orientation:  Full (Time, Place, and Person)  Thought Content: Logical   Suicidal Thoughts:  No  Homicidal Thoughts:  No  Memory:  WNL  Judgement:  Good  Insight:  Good  Psychomotor Activity:  Normal  Concentration:  Concentration: Good  Recall:  Good  Fund of Knowledge: Good  Language: Good  Assets:  Desire for Improvement  ADL's:  Intact  Cognition: WNL  Prognosis:  Good    DIAGNOSES:    ICD-10-CM   1. Generalized anxiety disorder  F41.1     2. Melancholy  F32.A     3. Bipolar disorder, in partial remission, most recent episode depressed ([place])  F31.75     4. Fatigue, unspecified type  R53.83       Receiving Psychotherapy: No    RECOMMENDATIONS:   Greater than 50% of 30  min of face to face time  was spent on counseling and coordination of care. She is stable today and does not want to make any changes to her medication. I have some concerns about her memory to take medications correctly. She was a little confused about how to take her Lamictal and Trazodone. I reviewed medications with her again and she wrote down directions.  Conducted AIMS  Score of 2 again today. It is my opinion that the movements appear to be rhythmic in nature and parkinsonian like. Most noticeable in the tongue and upper extremities. She currently is not on any high dose of antipsychotics but generous amount of Lamictal. There are more rare cases of anti seizure medication causing TD. She said her abnormal movements get worse when she does not sleep but her father had Parkinson's disease and it runs in family. She said she will be following up with neurology in early August. Will continue to observe and reassess in following visits.    Continue Seroquel 50 mg daily at bedtime Continue hydroxyzine 25 mg nightly 8H as needed. Continue Lamictal  300 mg daily. 200 mg in the am and 100 mg in the evening. Continue  Zoloft 50 mg daily. Stop the  trazodone 300 mg, 1.5 pills nightly. To try Mirtazapine 7.5 mg at bedtime. May increase to 15 mg if needed.  Continue Xanax 1 mg at bedtime but if taking Mirtazapine try to refrain from the xanax if possible. Discussed with th PT to try reducing xanax to 0.5 mg.  Return in 4 weeks to reassess.  Discussed potential benefits, risks, and side effects of stimulants with patient to include increased heart rate, palpitations, insomnia, increased anxiety, increased irritability, or decreased appetite.  Instructed patient to contact office if experiencing any significant tolerability issues.  Discussed potential metabolic side effects associated with atypical antipsychotics, as well as potential risk for movement side effects. Advised pt to contact office if movement side effects  occur.   PDMP was reviewed         Elwanda Brooklyn, NP

## 2022-03-06 ENCOUNTER — Other Ambulatory Visit: Payer: Self-pay | Admitting: Behavioral Health

## 2022-03-06 DIAGNOSIS — F5105 Insomnia due to other mental disorder: Secondary | ICD-10-CM

## 2022-03-08 ENCOUNTER — Other Ambulatory Visit: Payer: Self-pay | Admitting: Behavioral Health

## 2022-03-08 DIAGNOSIS — F32A Depression, unspecified: Secondary | ICD-10-CM

## 2022-03-08 DIAGNOSIS — F411 Generalized anxiety disorder: Secondary | ICD-10-CM

## 2022-03-08 DIAGNOSIS — R5383 Other fatigue: Secondary | ICD-10-CM

## 2022-04-11 ENCOUNTER — Ambulatory Visit: Payer: PPO | Admitting: Neurology

## 2022-04-11 ENCOUNTER — Encounter: Payer: Self-pay | Admitting: Neurology

## 2022-04-11 VITALS — BP 153/93 | HR 95 | Ht 64.5 in | Wt 197.0 lb

## 2022-04-11 DIAGNOSIS — R251 Tremor, unspecified: Secondary | ICD-10-CM | POA: Diagnosis not present

## 2022-04-11 NOTE — Patient Instructions (Signed)
It was nice to see you again today. You have a mild tremor of both hands. I do not see any signs or symptoms of parkinson's like disease or what we call parkinsonism.  For your tremor, I would not recommend any new medications at this time for fear of side effects.  Please remember, that any kind of tremor may be exacerbated by anxiety, anger, nervousness, excitement, dehydration, sleep deprivation, thyroid dysfunction, by caffeine, and low blood sugar values or blood sugar fluctuations. Some medications can exacerbate tremors, this includes certain asthma or COPD medications and certain antidepressants.

## 2022-04-11 NOTE — Progress Notes (Signed)
Subjective:    Patient ID: Becky Lawson is a 71 y.o. female.  HPI    Becky Age, MD, PhD [name] [address] [place], [name]  Dear Dr. Carlota Lawson,  I saw your patient, Becky Lawson, upon your kind request, in my neurologic clinic today for evaluation of her tremors.  The patient is unaccompanied today.  As you know, Becky Lawson is a 71 year old left-handed woman with an underlying complex medical history of thyroid disease, reflux disease, history of hepatitis C, hypertension, hyperlipidemia, sleep apnea, chronic kidney disease, mood disorder, arthritis, and mild obesity, who reports a longstanding history of hand tremors.  She feels that her tremor has become worse.  It is primarily noticeable when she holds something more with her hands outstretched, also with handwriting.  She still feels that she can write.  I had evaluated her a few years ago for tremors.  She had hand tremors and mild parkinsonism, likely secondary to polypharmacy especially taking antipsychotic medication at the time.  I reviewed your office note from 02/09/2022.  She had blood work in May 2023 and I was able to review results from 01/24/2022: CMP was benign, lipid panel benign with mildly elevated LDL at 119, vitamin D 48.84, TSH normal at 0.76.  Of note, she is on multiple medications including several psychotropic medications.  She is currently on Lamictal, hydroxyzine, Xanax, Remeron, Seroquel, sertraline, trazodone.  Full list of medications below.  I had evaluated her on 07/19/2018, at which time she reported that she was not able to go through with the DaTscan that I had recommended previously, she reported claustrophobia.  She had tried low-dose Sinemet without obvious improvement in her tremors.  Given her polypharmacy, I did not suggest any new medications for her tremors for fear of side effects. Of note, she is on several medications at this time, including several  psychotropic medications.  She is on alprazolam, hydroxyzine, Lamictal, Remeron, Seroquel, sertraline, and trazodone, full list of medications as below. Of note, she recently stopped taking her sertraline about a month ago, due to weight gain.  She feels restless at times, she has been wondering if she has tardive dyskinesia.  There is no evidence of tardive dyskinesia on exam today.  She has recently switched to psychiatric providers.  She limits her caffeine to half a cup of coffee in the morning and 1 serving of soda per day.  She does not drink any alcohol.  She quit smoking decades ago.  Her father had Parkinson's disease and lived to be 20.  She does not have a family history of tremors.  She had a left total hip replacement and is planning her right total hip replacement.  She has not had any eye examination in over 2 years.  Previously:    07/19/18: 71 year old left-handed woman with an underlying medical history of reflux disease, mood disorder with a diagnosis of bipolar disorder, abnormal liver function tests, lumbar spinal stenosis, hypothyroidism, obesity, sleep apnea, hypertension, hyperlipidemia, and arthritis, who presents for follow-up consultation of her tremors. The patient is unaccompanied today. I first met her on 02/20/2018 at the request of her primary care physician, at which time she reported a one year history of tremors with a longer standing history of mild her tremors in the past. She had features of essential tremor primarily but also very mild parkinsonism on examination. I had suggested we proceed with a DaT scan, but she could not go through  with it. I did not suggest any new medications but did encourage her to talk to her psychiatry provider about medication management. She was on multiple psychotropic medications.   She reports that she could not go through with the scan as she has claustrophobia. Her primary care physician prescribed Sinemet to be taken at bedtime. She  recently picked it up. She has not noticed any change in her tremor. She does report mouth dryness. Of note, she continues to take multiple psychotropic medications. She had a recent sleep study in September which showed mild sleep apnea. She was advised to pursue weight loss. She tries to hydrate well with water. She had some flareup of her back pain recently. She sees Dr. Nelva Lawson for pain management. She has had no recent falls.      (She) reports a bilateral upper extremity tremor for the past 1 year, progressive, more so on the right. She has noticed changes in her handwriting. She writes with her left hand. She does not have any brothers or sisters. She also reports a family history of Parkinson's disease in her father. I reviewed your office note from 10/21/2017. She was started on Ingrezza for tardive dyskinesias by her psychiatrist. She sees Becky Lawson for psychiatry. She has been on [name] for months. She was recently started on Ingrezza, but has not noticed an improvement in her tremors. Her tremor seems to be more on the right side than left. Her father lived to be 71 years old and also had strokes. Her mother lived to be 71 years old and had congestive heart failure, also history of stroke. Patient was seen in this office several years ago by Becky Lawson. She had a brain MRI at the time secondary to tremors and frequent falls. She also had a sleep evaluation with Dr. Roddie Lawson in 2011. She had an EEG in 2009 which was normal in the awake and light sleep stages. She has a long-standing history of bipolar disease. She has been on multiple different medications in the past but does not recall trying Abilify, Geodon, or Haldol. She has been on this but all in the past and has been on lithium but had significant side effects on it. She is divorced for many years, she has 1 son, 1 daughter, neither have tremors. She has no other family history of tremors. She lives alone. She does not smoke and does not drink:  A regular basis, drinks caffeine in the form of coffee, one cup per day on average. She takes Xanax for anxiety and also at night for sleep. She endorses increase in anxiety recently, increase in stress. She also is on Wellbutrin 300 mg strength once daily long-acting. Of note, in reviewing her previous office records, she has had a bilateral hand tremor for over 10 years, was previously labeled with essential tremor.    Her Past Medical History Is Significant For: Past Medical History:  Diagnosis Date   Arthritis    Benign cyst of right kidney 2014   2.7 cm right kidney cyst seen on imaging 2014 but was c/o right flank pain with new persistent proteinuria (fortunately hematuria had resolved) so repeat US 11/2016 showed right kidney still with 2.6 cm simple cyst and otherwise nml.   Bipolar 1 disorder ([place])    Cancer ([place])    Phreesia 06/15/2020   Chronic kidney disease    Depression    Depression    Phreesia 06/15/2020   GERD (gastroesophageal reflux disease)    Heart murmur  Hepatitis C    resolved completely after treatment in 2014, genotype 1b, followed at [facility]   Hyperlipidemia    Hypertension    Hypothyroidism    Jaundice 11/01/2012   Pruritic disorder 02/13/2013   Sleep apnea    Was diagnosed approximately 20 years ago, but does not wear CPAP patient stated "I gave it back.Marland KitchenMarland KitchenI couldn't wear that thing it was horrible"   Spinal stenosis    Thyroid disease    Phreesia 06/15/2020    Her Past Surgical History Is Significant For: Past Surgical History:  Procedure Laterality Date   ABDOMINAL HYSTERECTOMY     COLONOSCOPY W/ POLYPECTOMY     JOINT REPLACEMENT Bilateral    knee   KNEE ARTHROSCOPY Bilateral    PARTIAL HYSTERECTOMY     THYROID LOBECTOMY Left 06/16/2016   THYROID LOBECTOMY Left 06/16/2016   Procedure: LEFT THYROID LOBECTOMY;  Surgeon: Greer Pickerel, MD;  Location: [facility];  Service: General;  Laterality: Left;   TOTAL HIP ARTHROPLASTY Left 08/03/2021   Procedure:  TOTAL HIP ARTHROPLASTY ANTERIOR APPROACH;  Surgeon: Paralee Cancel, MD;  Location: [facility];  Service: Orthopedics;  Laterality: Left;    Her Family History Is Significant For: Family History  Problem Relation Lawson of Onset   Diabetes Mother    Heart disease Mother    Hyperlipidemia Mother    Hypertension Mother    Stroke Mother    Hypertension Father    Parkinson's disease Father    Heart disease Father    Hyperlipidemia Father    Hemachromatosis Daughter    Thyroid disease Neg Hx    Colon cancer Neg Hx    Esophageal cancer Neg Hx    Inflammatory bowel disease Neg Hx    Liver disease Neg Hx    Pancreatic cancer Neg Hx    Rectal cancer Neg Hx    Stomach cancer Neg Hx     Her Social History Is Significant For: Social History   Socioeconomic History   Marital status: Divorced    Spouse name: Not on file   Number of children: Not on file   Years of education: Not on file   Highest education level: Not on file  Occupational History   Occupation: Personal Caregiver  Tobacco Use   Smoking status: Former    Packs/day: 1.00    Years: 15.00    Total pack years: 15.00    Types: Cigarettes    Quit date: 06/30/1979    Years since quitting: 42.8   Smokeless tobacco: Never  Vaping Use   Vaping Use: Never used  Substance and Sexual Activity   Alcohol use: Not Currently    Alcohol/week: 0.0 - 1.0 standard drinks of alcohol   Drug use: No   Sexual activity: Yes    Birth control/protection: None  Other Topics Concern   Not on file  Social History Narrative   Divorced. Education: [employer]. Exercise: No.   Left handed   Lives alone   Caffeine: 1/4-1/2 cup/day   Social Determinants of Health   Financial Resource Strain: Low Risk  (12/16/2021)   Overall Financial Resource Strain (CARDIA)    Difficulty of Paying Living Expenses: Not hard at all  Food Insecurity: No Food Insecurity (12/16/2021)   Hunger Vital Sign    Worried About Running Out of Food in the Last Year: Never true     Ran Out of Food in the Last Year: Never true  Transportation Needs: No Transportation Needs (12/16/2021)   PRAPARE - Transportation  Lack of Transportation (Medical): No    Lack of Transportation (Non-Medical): No  Physical Activity: Insufficiently Active (12/16/2021)   Exercise Vital Sign    Days of Exercise per Week: 2 days    Minutes of Exercise per Session: 60 min  Stress: No Stress Concern Present (12/16/2021)   [place]    Feeling of Stress : Not at all  Social Connections: Moderately Isolated (12/16/2021)   Social Connection and Isolation Panel [NHANES]    Frequency of Communication with Friends and Family: Twice a week    Frequency of Social Gatherings with Friends and Family: Twice a week    Attends Religious Services: More than 4 times per year    Active Member of [employer] or Organizations: No    Attends Archivist Meetings: Never    Marital Status: Divorced    Her Allergies Are:  No Known Allergies:   Her Current Medications Are:  Outpatient Encounter Medications as of 04/11/2022  Medication Sig   ALPRAZolam (XANAX) 1 MG tablet TAKE 1/2 to 1 TABLET BY MOUTH TWICE A DAY AS NEEDED FOR ANXIETY (Patient taking differently: Take 1 mg by mouth at bedtime as needed. TAKE 1/2 to 1 TABLET BY MOUTH TWICE A DAY AS NEEDED FOR ANXIETY)   amLODipine (NORVASC) 2.5 MG tablet Take 1 tablet (2.5 mg total) by mouth daily. Add to 11m for total daily dose 7.544m  amLODipine (NORVASC) 5 MG tablet Take 1 tablet (5 mg total) by mouth daily.   azelastine (ASTELIN) 0.1 % nasal spray Place 1-2 sprays into both nostrils 2 (two) times daily. Use in each nostril as directed   dextroamphetamine (DEXTROSTAT) 10 MG tablet TAKE ONE TABLET BY MOUTH EVERY MORNING (Patient taking differently: As needed)   docusate sodium (COLACE) 100 MG capsule Take 1 capsule (100 mg total) by mouth 2 (two) times daily.   hydrOXYzine (ATARAX) 25 MG  tablet Take 1 tablet (25 mg total) by mouth every 8 (eight) hours as needed.   lamoTRIgine (LAMICTAL) 100 MG tablet TAKE TWO TABLETS BY MOUTH EVERY MORNING and TAKE ONE TABLET BY MOUTH EVERY EVENING   levothyroxine (SYNTHROID) 100 MCG tablet TAKE ONE TABLET BY MOUTH daily BEFORE breakfast   mirtazapine (REMERON) 7.5 MG tablet TAKE 1-2 TABLETS (15 MG TOTAL) AT BEDTIME AS NEEDED FOR SLEEP.   omeprazole (PRILOSEC) 40 MG capsule Take 1 capsule (40 mg total) by mouth 2 (two) times daily. (Patient taking differently: Take 40 mg by mouth daily.)   polyethylene glycol (MIRALAX / GLYCOLAX) 17 g packet Take 17 g by mouth daily as needed for mild constipation.   pravastatin (PRAVACHOL) 40 MG tablet Take 1 tablet (40 mg total) by mouth daily.   QUEtiapine (SEROQUEL) 50 MG tablet Take one half tablet 25 mg for 7 days, then take one whole tablet 50 mg total daily. (Patient taking differently: Take 50 mg by mouth daily. Take one half tablet 25 mg for 7 days, then take one whole tablet 50 mg total daily.)   traZODone (DESYREL) 150 MG tablet Take 2 tablets (300 mg total) by mouth at bedtime.   Vitamin D, Ergocalciferol, (DRISDOL) 1.25 MG (50000 UNIT) CAPS capsule Take 1 capsule (50,000 Units total) by mouth every 7 (seven) days. Sunday   sertraline (ZOLOFT) 50 MG tablet Take one tablet daily in the am with food. (Patient not taking: Reported on 04/11/2022)   Facility-Administered Encounter Medications as of 04/11/2022  Medication   cyanocobalamin ((VITAMIN B-12))  injection 1,000 mcg  :   Review of Systems:  Out of a complete 14 point review of systems, all are reviewed and negative with the exception of these symptoms as listed below:  Review of Systems  Neurological:        Patient is here alone for essential tremor. She states she can't remember why she stopped medication for it but she is ready to start again. Both hands are effected. She also mentions she has restless legs.     Objective:  Neurological  Exam  Physical Exam Physical Examination:   Vitals:   04/11/22 1449  BP: (!) 153/93  Pulse: 95    General Examination: The patient is a very pleasant 71 y.o. female in no acute distress. She appears well-developed and well-nourished and well groomed.   HEENT: Normocephalic, atraumatic, pupils are equal, round and reactive to light, extraocular tracking well-preserved, no significant facial masking.  No nuchal rigidity.  Speech without hypophonia or voice tremor.  No facial tremor.  She has a benign anterior neck scar from partial thyroidectomy.  Airway examination reveals moderate mouth dryness.  Tongue protrudes centrally and palate elevates symmetrically.  No carotid bruits.    Chest: Clear to auscultation without wheezing, rhonchi or crackles noted.  Heart: S1+S2+0, regular and normal without murmurs, rubs or gallops noted.   Abdomen: Soft, non-tender and non-distended.  Extremities: There is no pitting edema in the distal lower extremities bilaterally.   Skin: Warm and dry without trophic changes noted.   Musculoskeletal: exam reveals limited range of motion.  She is status post joint replacement surgeries both knees and left hip.    Neurologically:  Mental status: The patient is awake, alert and oriented in all 4 spheres. Her immediate and remote memory, attention, language skills and fund of knowledge are appropriate. There is no evidence of aphasia, agnosia, apraxia or anomia. Speech is clear with normal prosody and enunciation. Thought process is linear. Mood is normal and affect is normal.  Cranial nerves II - XII are as described above under HEENT exam.  Motor exam: Normal bulk, strength and tone is noted. There is a very slight and intermittent resting tremor in both hands, more noticeable on the right.   On 04/11/2022 : On Archimedes spiral drawing she has mild trembling with the right, mild insecurity but no trembling with the left, handwriting with the left hand is slightly  tremulous, legible, not micrographic.  She has a mild bilateral upper extremity postural and slight action tremor, no intention tremor.  She has no lower extremity tremor.   Fine motor skills and coordination: Intact with finger taps, hand movements and rapid alternating patting in the upper extremities, good foot taps bilaterally, no decrement in amplitude noted in the upper and lower extremities.    Cerebellar testing: No dysmetria or intention tremor. There is no truncal or gait ataxia.  Normal finger-to-nose, mild difficulty with heel-to-shin due to range of motion restriction. Sensory exam: intact to light touch in the upper and lower extremities.  Gait, station and balance: She stands without difficulty, upper body slightly forward tilted with increase in lumbar kyphosis.  She walks with preserved arm swing, no shuffling.   Assessment and Plan:   In summary, Becky Lawson is a very pleasant 71 y.o.-year old female with an underlying complex medical history of thyroid disease, reflux disease, history of hepatitis C, hypertension, hyperlipidemia, sleep apnea, chronic kidney disease, mood disorder, arthritis, and mild obesity, who presents for evaluation of her hand  tremors of several years duration.  Examination does not show any severe tremor, no telltale signs of parkinsonism.  History is not compelling for hereditary/familial tremor but appearance is in keeping with the possibility of an underlying essential tremor.  Contributors to her tremor include stress, certain medications including sertraline which she recently actually stopped.  We talked about her tremor options.  First-line options typically include a beta-blocker or primidone.  I would not favor a beta-blocker what with her diagnosis of depression which may get worse on a beta-blocker.  Mysoline may cause sleepiness and balance issues and she is already on multiple psychotropic medications that can affect her balance and sleepiness.  We  mutually agreed to not try her on any symptomatic medications at this time.  She is advised to try to hydrate well and limit her caffeine.  We talked about common tremor triggers.  We mutually agreed to have her follow-up with you on a regular basis.  She is encouraged to seek evaluation with an eye doctor for routine checkup as she has not seen an eye doctor in over 2 years.  As of right now, she does not need a scheduled follow-up appointment in our neurology clinic.  She is reassured that I do not see any signs of parkinsonism or Parkinson's disease.  I answered all her questions today and she was in agreement.   Thank you very much for allowing me to participate in the care of this nice patient. If I can be of any further assistance to you please do not hesitate to call me at [phone].  Sincerely,   Becky Age, MD, PhD

## 2022-04-13 ENCOUNTER — Encounter: Payer: Self-pay | Admitting: Behavioral Health

## 2022-04-13 ENCOUNTER — Ambulatory Visit (INDEPENDENT_AMBULATORY_CARE_PROVIDER_SITE_OTHER): Payer: PPO | Admitting: Behavioral Health

## 2022-04-13 DIAGNOSIS — F5105 Insomnia due to other mental disorder: Secondary | ICD-10-CM | POA: Diagnosis not present

## 2022-04-13 DIAGNOSIS — F32A Depression, unspecified: Secondary | ICD-10-CM

## 2022-04-13 DIAGNOSIS — F3175 Bipolar disorder, in partial remission, most recent episode depressed: Secondary | ICD-10-CM | POA: Diagnosis not present

## 2022-04-13 DIAGNOSIS — F319 Bipolar disorder, unspecified: Secondary | ICD-10-CM

## 2022-04-13 DIAGNOSIS — F99 Mental disorder, not otherwise specified: Secondary | ICD-10-CM | POA: Diagnosis not present

## 2022-04-13 DIAGNOSIS — R5383 Other fatigue: Secondary | ICD-10-CM

## 2022-04-13 DIAGNOSIS — F489 Nonpsychotic mental disorder, unspecified: Secondary | ICD-10-CM | POA: Diagnosis not present

## 2022-04-13 DIAGNOSIS — F411 Generalized anxiety disorder: Secondary | ICD-10-CM | POA: Diagnosis not present

## 2022-04-13 MED ORDER — VILAZODONE HCL 20 MG PO TABS: 20.0000 mg | ORAL_TABLET | Freq: Every day | ORAL | 1 refills | Status: DC

## 2022-04-13 NOTE — Progress Notes (Signed)
[facility]  Patient ID: SALONI LABLANC,  MRN: [id number]  PCP: Wendie Agreste, MD  Date of Evaluation: 04/13/2022 Time spent:20 minutes  Chief Complaint:  Chief Complaint   Anxiety; Depression; Follow-up; Medication Refill; Medication Problem; Patient Education     HISTORY/CURRENT STATUS: HPI Becky Lawson presents for follow-up and medication management. No manic episodes since last visit.  She is trying to avoid so much late night reading and staying up. She feels like she is getting fine tremors in both upper extremities bilaterally but has been consistent last couple of visits and not becoming more pronounced. Says neurology told her that it may be from medication but more related to age and would not go away. She says that it does not bother her. She stopped her Zoloft. Says that she gained 8 lbs on the medication so she stopped. She would like to try another antidepressant.  Says her anxiety today is 0/10 and depression is 4/10. She was sleeping  7 hours per night with aid of medication but last night slept 0. She denies mania, no psychosis, NO SI/HI.   Past medications for mental health diagnoses include: Lithium, Seroquel, Effexor, Prozac, Latuda, Abilify, Dexedrine, Cymbalta caused weight gain.  Modafinil.  Individual Medical History/ Review of Systems: Changes? :No   Allergies: Patient has no known allergies.  Current Medications:  Current Outpatient Medications:    Vilazodone HCl 20 MG TABS, Take 1 tablet (20 mg total) by mouth daily., Disp: 30 tablet, Rfl: 1   ALPRAZolam (XANAX) 1 MG tablet, TAKE 1/2 to 1 TABLET BY MOUTH TWICE A DAY AS NEEDED FOR ANXIETY (Patient taking differently: Take 1 mg by mouth at bedtime as needed. TAKE 1/2 to 1 TABLET BY MOUTH TWICE A DAY AS NEEDED FOR ANXIETY), Disp: 45 tablet, Rfl: 3   amLODipine (NORVASC) 2.5 MG tablet, Take 1 tablet (2.5 mg total) by mouth daily. Add to '5mg'$  for total daily dose 7.'5mg'$ , Disp: 90 tablet, Rfl: 1    amLODipine (NORVASC) 5 MG tablet, Take 1 tablet (5 mg total) by mouth daily., Disp: 90 tablet, Rfl: 1   azelastine (ASTELIN) 0.1 % nasal spray, Place 1-2 sprays into both nostrils 2 (two) times daily. Use in each nostril as directed, Disp: 30 mL, Rfl: 3   dextroamphetamine (DEXTROSTAT) 10 MG tablet, TAKE ONE TABLET BY MOUTH EVERY MORNING (Patient taking differently: As needed), Disp: 30 tablet, Rfl: 0   docusate sodium (COLACE) 100 MG capsule, Take 1 capsule (100 mg total) by mouth 2 (two) times daily., Disp: 10 capsule, Rfl: 0   hydrOXYzine (ATARAX) 25 MG tablet, Take 1 tablet (25 mg total) by mouth every 8 (eight) hours as needed., Disp: 60 tablet, Rfl: 5   lamoTRIgine (LAMICTAL) 100 MG tablet, TAKE TWO TABLETS BY MOUTH EVERY MORNING and TAKE ONE TABLET BY MOUTH EVERY EVENING, Disp: 270 tablet, Rfl: 0   levothyroxine (SYNTHROID) 100 MCG tablet, TAKE ONE TABLET BY MOUTH daily BEFORE breakfast, Disp: 90 tablet, Rfl: 1   mirtazapine (REMERON) 7.5 MG tablet, TAKE 1-2 TABLETS (15 MG TOTAL) AT BEDTIME AS NEEDED FOR SLEEP., Disp: 180 tablet, Rfl: 0   omeprazole (PRILOSEC) 40 MG capsule, Take 1 capsule (40 mg total) by mouth 2 (two) times daily. (Patient taking differently: Take 40 mg by mouth daily.), Disp: 180 capsule, Rfl: 1   polyethylene glycol (MIRALAX / GLYCOLAX) 17 g packet, Take 17 g by mouth daily as needed for mild constipation., Disp: 14 each, Rfl: 0   pravastatin (PRAVACHOL)  40 MG tablet, Take 1 tablet (40 mg total) by mouth daily., Disp: 90 tablet, Rfl: 3   QUEtiapine (SEROQUEL) 50 MG tablet, Take one half tablet 25 mg for 7 days, then take one whole tablet 50 mg total daily. (Patient taking differently: Take 50 mg by mouth daily. Take one half tablet 25 mg for 7 days, then take one whole tablet 50 mg total daily.), Disp: 30 tablet, Rfl: 3   sertraline (ZOLOFT) 50 MG tablet, Take one tablet daily in the am with food. (Patient not taking: Reported on 04/11/2022), Disp: 30 tablet, Rfl: 1   traZODone  (DESYREL) 150 MG tablet, Take 2 tablets (300 mg total) by mouth at bedtime., Disp: 180 tablet, Rfl: 1   Vitamin D, Ergocalciferol, (DRISDOL) 1.25 MG (50000 UNIT) CAPS capsule, Take 1 capsule (50,000 Units total) by mouth every 7 (seven) days. Sunday, Disp: 12 capsule, Rfl: 3  Current Facility-Administered Medications:    cyanocobalamin ((VITAMIN B-12)) injection 1,000 mcg, 1,000 mcg, Intramuscular, Q30 days, Shawnee Knapp, MD, 1,000 mcg at 07/12/18 1652 Medication Side Effects: none  Family Medical/ Social History: Changes? No  MENTAL HEALTH EXAM:  There were no vitals taken for this visit.There is no height or weight on file to calculate BMI.  General Appearance: Casual, Neat, and Well Groomed  Eye Contact:  Good  Speech:  Clear and Coherent  Volume:  Normal  Mood:  Depressed and Dysphoric  Affect:  Congruent, Depressed, and Flat  Thought Process:  Coherent  Orientation:  Full (Time, Place, and Person)  Thought Content: Logical   Suicidal Thoughts:  No  Homicidal Thoughts:  No  Memory:  WNL  Judgement:  Good  Insight:  Good  Psychomotor Activity:  Normal  Concentration:  Concentration: Good  Recall:  Good  Fund of Knowledge: Good  Language: Good  Assets:  Desire for Improvement  ADL's:  Intact  Cognition: WNL  Prognosis:  Good    DIAGNOSES:    ICD-10-CM   1. Generalized anxiety disorder  F41.1 Vilazodone HCl 20 MG TABS    2. Melancholy  F32.A Vilazodone HCl 20 MG TABS    3. Bipolar disorder, in partial remission, most recent episode depressed ([place])  F31.75     4. Fatigue, unspecified type  R53.83     5. Insomnia due to other mental disorder  F51.05    F99     6. No diagnosis on Axis I  F48.9     7. Bipolar depression ([place])  F31.9 Vilazodone HCl 20 MG TABS      Receiving Psychotherapy: No    RECOMMENDATIONS:   Greater than 50% of 30  min of face to face time  was spent on counseling and coordination of care. We discussed her report of gaining weight on  Zoloft. . I explained that this was not as common but she stopped medication two weeks ago. She would like to try another medication for depression.  Conducted AIMS  Score of 1  today.  Movements in hands were not as pronounced as last visit.  She currently is not on any high dose of antipsychotics but generous amount of Lamictal. There are more rare cases of anti seizure medication causing TD. She said her abnormal movements get worse when she does not sleep. Will continue to observe and reassess in following visits.  She concludes that the movements do not bother her or effect her quality of life.    Continue Seroquel 50 mg daily at bedtime Continue hydroxyzine 25  mg nightly 8H as needed. Continue Lamictal  300 mg daily. 200 mg in the am and 100 mg in the evening. To start Viibryd 10 mg for 7 days, then 20 mg daily.  Stop the  trazodone 300 mg, 1.5 pills nightly. Continue Mirtazapine 7.5 mg at bedtime. May increase to 15 mg if needed.  Continue Xanax 1 mg at bedtime but if taking Mirtazapine try to refrain from the xanax if possible. Discussed with th PT to try reducing xanax to 0.5 mg.  Return in 4 weeks to reassess.  Discussed potential benefits, risks, and side effects of stimulants with patient to include increased heart rate, palpitations, insomnia, increased anxiety, increased irritability, or decreased appetite.  Instructed patient to contact office if experiencing any significant tolerability issues.  Discussed potential metabolic side effects associated with atypical antipsychotics, as well as potential risk for movement side effects. Advised pt to contact office if movement side effects occur.   PDMP was reviewed     Elwanda Brooklyn, NP

## 2022-04-17 ENCOUNTER — Other Ambulatory Visit: Payer: Self-pay | Admitting: Behavioral Health

## 2022-04-17 DIAGNOSIS — F32A Depression, unspecified: Secondary | ICD-10-CM

## 2022-04-17 DIAGNOSIS — F411 Generalized anxiety disorder: Secondary | ICD-10-CM

## 2022-04-17 DIAGNOSIS — R5383 Other fatigue: Secondary | ICD-10-CM

## 2022-04-19 ENCOUNTER — Other Ambulatory Visit: Payer: Self-pay | Admitting: Behavioral Health

## 2022-04-19 DIAGNOSIS — F411 Generalized anxiety disorder: Secondary | ICD-10-CM

## 2022-04-19 DIAGNOSIS — F5105 Insomnia due to other mental disorder: Secondary | ICD-10-CM

## 2022-04-19 DIAGNOSIS — F32A Depression, unspecified: Secondary | ICD-10-CM

## 2022-04-19 DIAGNOSIS — R5383 Other fatigue: Secondary | ICD-10-CM

## 2022-04-19 DIAGNOSIS — F3162 Bipolar disorder, current episode mixed, moderate: Secondary | ICD-10-CM

## 2022-04-21 ENCOUNTER — Other Ambulatory Visit: Payer: Self-pay | Admitting: Family Medicine

## 2022-04-21 DIAGNOSIS — I10 Essential (primary) hypertension: Secondary | ICD-10-CM

## 2022-04-22 ENCOUNTER — Telehealth: Payer: Self-pay | Admitting: Behavioral Health

## 2022-04-22 ENCOUNTER — Telehealth: Payer: Self-pay | Admitting: Pharmacist

## 2022-04-22 NOTE — Progress Notes (Signed)
Chronic Care Management Pharmacy Assistant   Name: Becky Lawson  MRN: [id number] DOB: Nov 01, 1950   Reason for Encounter: Monthly Medication Coordination Call     Medications: Outpatient Encounter Medications as of 04/22/2022  Medication Sig   ALPRAZolam (XANAX) 1 MG tablet TAKE 1/2 to 1 TABLET BY MOUTH TWICE A DAY AS NEEDED FOR ANXIETY (Patient taking differently: Take 1 mg by mouth at bedtime as needed. TAKE 1/2 to 1 TABLET BY MOUTH TWICE A DAY AS NEEDED FOR ANXIETY)   amLODipine (NORVASC) 2.5 MG tablet TAKE ONE TABLET BY MOUTH ONCE DAILY in addition to '5mg'$  for daily dose of 7.'5mg'$    amLODipine (NORVASC) 5 MG tablet Take 1 tablet (5 mg total) by mouth daily.   azelastine (ASTELIN) 0.1 % nasal spray Place 1-2 sprays into both nostrils 2 (two) times daily. Use in each nostril as directed   dextroamphetamine (DEXTROSTAT) 10 MG tablet TAKE ONE TABLET BY MOUTH EVERY MORNING (Patient taking differently: As needed)   docusate sodium (COLACE) 100 MG capsule Take 1 capsule (100 mg total) by mouth 2 (two) times daily.   hydrOXYzine (ATARAX) 25 MG tablet Take 1 tablet (25 mg total) by mouth every 8 (eight) hours as needed.   lamoTRIgine (LAMICTAL) 100 MG tablet TAKE TWO TABLETS BY MOUTH EVERY MORNING and TAKE ONE TABLET BY MOUTH EVERY EVENING   levothyroxine (SYNTHROID) 100 MCG tablet TAKE ONE TABLET BY MOUTH daily BEFORE breakfast   mirtazapine (REMERON) 7.5 MG tablet TAKE 1-2 TABLETS (15 MG TOTAL) AT BEDTIME AS NEEDED FOR SLEEP.   omeprazole (PRILOSEC) 40 MG capsule Take 1 capsule (40 mg total) by mouth 2 (two) times daily. (Patient taking differently: Take 40 mg by mouth daily.)   polyethylene glycol (MIRALAX / GLYCOLAX) 17 g packet Take 17 g by mouth daily as needed for mild constipation.   pravastatin (PRAVACHOL) 40 MG tablet Take 1 tablet (40 mg total) by mouth daily.   QUEtiapine (SEROQUEL) 50 MG tablet TAKE ONE TABLET BY MOUTH ONCE DAILY   sertraline (ZOLOFT) 50 MG tablet TAKE ONE TABLET BY  MOUTH EVERY MORNING   traZODone (DESYREL) 150 MG tablet Take 2 tablets (300 mg total) by mouth at bedtime.   Vilazodone HCl 20 MG TABS Take 1 tablet (20 mg total) by mouth daily.   Vitamin D, Ergocalciferol, (DRISDOL) 1.25 MG (50000 UNIT) CAPS capsule Take 1 capsule (50,000 Units total) by mouth every 7 (seven) days. Sunday   Facility-Administered Encounter Medications as of 04/22/2022  Medication Note   cyanocobalamin ((VITAMIN B-12)) injection 1,000 mcg 08/08/2019: Not taking   Reviewed chart for medication changes ahead of medication coordination call.  No OVs, Consults, or hospital visits since last care coordination call/Pharmacist visit. (If appropriate, list visit date, provider name)  No medication changes indicated OR if recent visit, treatment plan here.  BP Readings from Last 3 Encounters:  04/11/22 (!) 153/93  02/09/22 (!) 144/70  01/24/22 (!) 156/72    Lab Results  Component Value Date   HGBA1C 5.0 07/12/2018     Patient obtains medications through Adherence Packaging  90 Days   Last adherence delivery included: (medication name and frequency)  QUEtiapine (SEROQUEL) 50 MG tablet 1 tablet by mouth daily at bedtime Trazodone 150 mg 2 tablet at bedtime  Vitamin D 50,000 1 capsule every Sunday am  Alprazolam 1 mg 1 tablet at bedtime as needed (in vial) Pravastatin '40mg'$  1 tablet daily Omeprazole 40 mg (I tablet daily at bedtime) Lamotrigine 200 mg 1 tablet  twice daily (in vial) Levothyroxine (SYNTHROID) 100 MCG tablet 1 tablet by mouth daily 30 min prior to breakfast Amlodipine (NORVASC) 5 MG tablet 1 tablet daily Hydroxyzine '25mg'$  q8h prn   Patient is due for next adherence delivery on: 05/02/22. Called patient and reviewed medications and coordinated delivery.   This delivery to include:  QUEtiapine (SEROQUEL) 50 MG tablet 1 tablet by mouth daily at bedtime Trazodone 150 mg 2 tablet at bedtime  Vitamin D 50,000 1 capsule every Sunday am  Alprazolam 1 mg 1  tablet at bedtime as needed (in vial) Pravastatin '40mg'$  1 tablet daily Omeprazole 40 mg (I tablet daily at bedtime) Levothyroxine (SYNTHROID) 100 MCG tablet 1 tablet by mouth daily 30 min prior to breakfast Amlodipine (NORVASC) 5 MG tablet 1 tablet daily with 2.5 mg Amlodipine 2.5 mg 1 tablet daily with 5 mg  Hydroxyzine '25mg'$  q8h prn Would like to check pricing  Viibryd '20mg'$  take 1 tablet every morning at breakfast    Patient declined the following medications (meds) due to (reason) has enough on hand  Lamotrigine 200 mg 1 tablet twice daily (in vial)  Patient needs refills from specialist Amlodipine (NORVASC) 2.5 MG tablet 1 tablet daily w '5mg'$  Lamotrigine 200 mg 1 tablet twice daily (in vial) Alprazolam 1 mg 1 tablet at bedtime as needed (in vial)   Confirmed delivery date of 05/02/22, advised patient that pharmacy will contact them the morning of delivery.   Care Gaps   AWV: unknown  Colonoscopy: unknown  DM Eye Exam: N/A DM Foot Exam: N/A Microalbumin: N/A HbgAIC: N/A DEXA: done 09/17/15 Mammogram: done 03/25/21     Star Rating Drugs: Pravastatin (PRAVACHOL) 40 MG tablet - last filled 01/26/22 90 days    Future Appointments  Date Time Provider [place]  05/16/2022  1:00 PM Elwanda Brooklyn, NP [facility] None  05/18/2022  2:00 PM [facility] PHARMACIST [facility]  07/25/2022  2:20 PM Wendie Agreste, MD [facility] [place], [facility] Clinical Pharmacist Assistant  [phone]

## 2022-04-22 NOTE — Telephone Encounter (Signed)
Pt stated she was doing better taking Lamictal 200 mg BID.She knows it has not been enough time for the new med to kick in but she wants to go back to this dose of Lamictal if possible

## 2022-04-22 NOTE — Telephone Encounter (Signed)
Patient called asking if she could increase her lactimal dosage back to '400mg'$  2x a day. Please rtc [phone]

## 2022-04-22 NOTE — Telephone Encounter (Signed)
It has not been long enough to get baseline on new Viibryd. I do not recommend that at this time. Please have her wait if she can. I do not like that high dose of Lamictal if possible. We have gone back and forth several times.

## 2022-04-22 NOTE — Telephone Encounter (Signed)
Pt informed

## 2022-05-04 DIAGNOSIS — M5136 Other intervertebral disc degeneration, lumbar region: Secondary | ICD-10-CM | POA: Diagnosis not present

## 2022-05-04 DIAGNOSIS — Z79891 Long term (current) use of opiate analgesic: Secondary | ICD-10-CM | POA: Diagnosis not present

## 2022-05-12 NOTE — Progress Notes (Signed)
Chronic Care Management Pharmacy Note  05/18/2022 Name:  Becky Lawson MRN:  [id number] DOB:  1950-10-07  Summary: FU with PharmD.  BP remains elevated despite dose increase.  Has not had a way to check at home lately but when she was systolic was 174-081.  Ldl also remains above goal  10 year ASCVD risk 19.4% - almost into high risk category for statin - currently tolerating moderate dose well.  Recommendations: Consider amlodipine 10mg , consider lipitor 40mg    FU 3 months on BP    Subjective: Becky Lawson is an 71 y.o. year old female who is a primary patient of [name], Ranell Patrick, MD.  The CCM team was consulted for assistance with disease management and care coordination needs.    Engaged with patient by telephone for follow up visit in response to provider referral for pharmacy case management and/or care coordination services.   Consent to Services:  The patient was given the following information about Chronic Care Management services today, agreed to services, and gave verbal consent: 1. CCM service includes personalized support from designated clinical staff supervised by the primary care provider, including individualized plan of care and coordination with other care providers 2. 24/7 contact phone numbers for assistance for urgent and routine care needs. 3. Service will only be billed when office clinical staff spend 20 minutes or more in a month to coordinate care. 4. Only one practitioner may furnish and bill the service in a calendar month. 5.The patient may stop CCM services at any time (effective at the end of the month) by phone call to the office staff. 6. The patient will be responsible for cost sharing (co-pay) of up to 20% of the service fee (after annual deductible is met). Patient agreed to services and consent obtained.  Patient Care Team: Wendie Agreste, MD as PCP - General (Family Medicine) Luz Brazen as Physician Assistant (Psychiatry) Marty Heck,  MD as Consulting Physician (Gastroenterology) Adrian Prows, MD as Consulting Physician (Cardiology) Renato Shin, MD (Inactive) as Consulting Physician (Endocrinology) Greer Pickerel, MD as Consulting Physician (General Surgery) Suella Broad, MD as Consulting Physician (Physical Medicine and Rehabilitation) Edythe Clarity, [facility] (Pharmacist)  Recent office visits:  07/19/21 Merri Ray, MD (PCP) - Family Medicine - Hypertension - labs were ordered. Referral placed for General Surgery. Flu vaccine administered in office. Follow up in 6 months.    04/19/21 Merri Ray, MD - Family Medicine - Fatigue - fluticasone (FLONASE) 50 MCG/ACT nasal spray prescribed. Follow up as scheduled.    Recent consult visits:  08/13/21 Lesle Chris, NP - Behavioral Health - Fatigue - No medication changes. Follow up as scheduled.   07/08/21 Levy Pupa - No notes available   06/24/21 Lesle Chris, NP - Behavioral Health - Fatigue - No medication changes. Follow up as scheduled.    05/31/21 Paralee Cancel, MD - Orthopedics - No notes available   05/27/21 Lesle Chris, NP - Behavioral Health - Fatigue - No medication changes. Follow up as scheduled.   05/07/21 Levy Pupa - Pain in left hip - No notes available.      Hospital visits: 08/03/21 - 08/05/21 Medication Reconciliation was completed by comparing discharge summary, patient's EMR and Pharmacy list, and upon discussion with patient.   Admitted to the hospital on 08/03/21 due to Left  hip osteoarthritis. Discharge date was 08/05/21. Discharged from [place]?Medications Started at [facility] Discharge:?? acetaminophen (TYLENOL) aspirin celecoxib (CELEBREX) docusate sodium (COLACE) methocarbamol (  ROBAXIN) polyethylene glycol (MIRALAX / GLYCOLAX)   Medication Changes at Hospital Discharge: None noted   Medications Discontinued at Hospital Discharge: HYDROcodone-acetaminophen 10-325 MG tablet [facility])   Medications that remain  the same after Hospital Discharge:??  All other medications will remain the same.     Objective:  Lab Results  Component Value Date   CREATININE 0.74 01/24/2022   BUN 16 01/24/2022   GFR 81.58 01/24/2022   GFRNONAA >60 08/04/2021   GFRAA 80 04/30/2020   NA 141 01/24/2022   K 4.7 01/24/2022   CALCIUM 9.8 01/24/2022   CO2 28 01/24/2022   GLUCOSE 109 (H) 01/24/2022    Lab Results  Component Value Date/Time   HGBA1C 5.0 07/12/2018 04:48 PM   HGBA1C 5.0 02/21/2018 04:25 PM   GFR 81.58 01/24/2022 03:44 PM   GFR 68.61 01/14/2021 02:43 PM    Last diabetic Eye exam: No results found for: "HMDIABEYEEXA"  Last diabetic Foot exam: No results found for: "HMDIABFOOTEX"   Lab Results  Component Value Date   CHOL 196 01/24/2022   HDL 54.40 01/24/2022   LDLCALC 119 (H) 01/24/2022   TRIG 113.0 01/24/2022   CHOLHDL 4 01/24/2022       Latest Ref Rng & Units 01/24/2022    3:44 PM 07/21/2021    2:38 PM 01/14/2021    2:43 PM  Hepatic Function  Total Protein 6.0 - 8.3 g/dL 6.8  7.0  7.1   Albumin 3.5 - 5.2 g/dL 4.7  4.4  4.7   AST 0 - 37 U/L _0 ALT 0 - 35 U/L _1 Alk Phosphatase 39 - 117 U/L 88  74  64   Total Bilirubin 0.2 - 1.2 mg/dL 0.5  0.5  0.3     Lab Results  Component Value Date/Time   TSH 0.76 01/24/2022 03:44 PM   TSH 1.55 01/14/2021 02:43 PM   FREET4 1.61 02/21/2018 04:25 PM       Latest Ref Rng & Units 08/05/2021    2:53 AM 08/04/2021    3:19 AM 07/21/2021    2:38 PM  CBC  WBC 4.0 - 10.5 K/uL 13.4  13.0  5.9   Hemoglobin 12.0 - 15.0 g/dL 10.1  10.5  13.7   Hematocrit 36.0 - 46.0 % 30.0  31.7  42.2   Platelets 150 - 400 K/uL 223  230  289     Lab Results  Component Value Date/Time   VD25OH 48.84 01/24/2022 03:44 PM   VD25OH 50.16 01/14/2021 02:43 PM    Clinical ASCVD: No  The 10-year ASCVD risk score (Arnett DK, et al., 2019) is: 19.4%   Values used to calculate the score:     Age: 71 years     Sex: Female     Is Non-Hispanic  African American: No     Diabetic: No     Tobacco smoker: No     Systolic Blood Pressure: 977 mmHg     Is BP treated: Yes     HDL Cholesterol: 54.4 mg/dL     Total Cholesterol: 196 mg/dL       01/24/2022    2:34 PM 01/10/2022    2:50 PM 12/16/2021    4:01 PM  Depression screen PHQ 2/9  Decreased Interest 2 0 0  Down, Depressed, Hopeless 2 0 0  PHQ - 2 Score 4 0 0  Altered sleeping 3 0   Tired, decreased energy 3 0  Change in appetite 0 0   Feeling bad or failure about yourself  0 0   Trouble concentrating 0 0   Moving slowly or fidgety/restless 0 0   Suicidal thoughts 0 0   PHQ-9 Score 10 0   Difficult doing work/chores  Not difficult at all      Social History   Tobacco Use  Smoking Status Former   Packs/day: 1.00   Years: 15.00   Total pack years: 15.00   Types: Cigarettes   Quit date: 06/30/1979   Years since quitting: 42.9  Smokeless Tobacco Never   BP Readings from Last 3 Encounters:  04/11/22 (!) 153/93  02/09/22 (!) 144/70  01/24/22 (!) 156/72   Pulse Readings from Last 3 Encounters:  04/11/22 95  02/09/22 76  01/24/22 86   Wt Readings from Last 3 Encounters:  04/11/22 197 lb (89.4 kg)  02/09/22 191 lb (86.6 kg)  01/24/22 186 lb (84.4 kg)   BMI Readings from Last 3 Encounters:  04/11/22 33.29 kg/m  02/09/22 32.28 kg/m  01/24/22 31.43 kg/m    Assessment/Interventions: Review of patient past medical history, allergies, medications, health status, including review of consultants reports, laboratory and other test data, was performed as part of comprehensive evaluation and provision of chronic care management services.   SDOH:  (Social Determinants of Health) assessments and interventions performed: No, done within the year Financial Resource Strain: Low Risk  (12/16/2021)   Overall Financial Resource Strain (CARDIA)    Difficulty of Paying Living Expenses: Not hard at all    SDOH Interventions    Flowsheet Row Clinical Support from 12/16/2021  in [place] Office Visit from 03/26/2019 in [place] at [place] from 03/22/2019 in [place] at [place] from 03/20/2019 in [place] at [facility] from 07/24/2017 in [place] at [facility] Patient Outreach Telephone from 07/13/2017 in [place] Interventions Intervention Not Indicated -- -- -- -- --  Housing Interventions Intervention Not Indicated -- -- -- -- --  Transportation Interventions Intervention Not Indicated -- -- -- -- --  Depression Interventions/Treatment  -- Currently on Treatment Currently on Treatment Counseling, Currently on Treatment Currently on Treatment Medication  Financial Strain Interventions Intervention Not Indicated -- -- -- -- --  Physical Activity Interventions Intervention Not Indicated -- -- -- -- --  Stress Interventions Intervention Not Indicated -- -- -- -- --  Social Connections Interventions Intervention Not Indicated -- -- -- -- --      Financial Resource Strain: Low Risk  (12/16/2021)   Overall Financial Resource Strain (CARDIA)    Difficulty of Paying Living Expenses: Not hard at all    [place]: No Food Insecurity (12/16/2021)  Housing: Low Risk  (12/16/2021)  Transportation Needs: No Transportation Needs (12/16/2021)  Alcohol Screen: Low Risk  (12/16/2021)  Depression (PHQ2-9): Medium Risk (01/24/2022)  Financial Resource Strain: Low Risk  (12/16/2021)  Physical Activity: Insufficiently Active (12/16/2021)  Social Connections: Moderately Isolated (12/16/2021)  Stress: No Stress Concern Present (12/16/2021)  Tobacco Use: Medium Risk (04/13/2022)    CCM Care Plan  No Known Allergies  Medications Reviewed Today     Reviewed by Edythe Clarity, [facility] (Pharmacist) on 05/18/22 at 1434  Med List Status: <None>   Medication Order Taking? Sig Documenting Provider Last Dose Status Informant   ALPRAZolam (XANAX) 1 MG tablet [id number] Yes TAKE 1/2 to  1 TABLET BY MOUTH TWICE A DAY AS NEEDED FOR ANXIETY  Patient taking differently: Take 1 mg by mouth at bedtime as needed. TAKE 1/2 to 1 TABLET BY MOUTH TWICE A DAY AS NEEDED FOR ANXIETY   White, Louanna Raw, NP Taking Active   amLODipine (NORVASC) 2.5 MG tablet [id number] Yes TAKE ONE TABLET BY MOUTH ONCE DAILY in addition to $RemoveBef'5mg'MXMmZfQdcz$  for daily dose of 7.$RemoveB'5mg'DLvSAzlf$  Wendie Agreste, MD Taking Active   amLODipine (NORVASC) 5 MG tablet [id number] Yes Take 1 tablet (5 mg total) by mouth daily. Wendie Agreste, MD Taking Active   azelastine (ASTELIN) 0.1 % nasal spray [id number] Yes Place 1-2 sprays into both nostrils 2 (two) times daily. Use in each nostril as directed Wendie Agreste, MD Taking Active   cyanocobalamin ((VITAMIN B-12)) injection 1,000 mcg [id number]   Shawnee Knapp, MD  Active            Med Note Blanch Media, CYNTHIA A   Thu Aug 08, 2019 11:21 AM) Not taking  dextroamphetamine (DEXTROSTAT) 10 MG tablet [id number] Yes TAKE ONE TABLET BY MOUTH every morning Lesle Chris A, NP Taking Active   docusate sodium (COLACE) 100 MG capsule [id number] Yes Take 1 capsule (100 mg total) by mouth 2 (two) times daily. Irving Copas, PA-C Taking Active   hydrOXYzine (ATARAX) 25 MG tablet [id number] Yes Take 1 tablet (25 mg total) by mouth every 8 (eight) hours as needed. Elwanda Brooklyn, NP Taking Active   lamoTRIgine (LAMICTAL) 100 MG tablet [id number] Yes TAKE TWO TABLETS BY MOUTH EVERY MORNING and TAKE ONE TABLET BY MOUTH EVERY EVENING White, Louanna Raw, NP Taking Active   levothyroxine (SYNTHROID) 100 MCG tablet [id number] Yes TAKE ONE TABLET BY MOUTH daily BEFORE breakfast Wendie Agreste, MD Taking Active   mirtazapine (REMERON) 7.5 MG tablet [id number] Yes TAKE 1-2 TABLETS (15 MG TOTAL) AT BEDTIME AS NEEDED FOR SLEEP. Elwanda Brooklyn, NP Taking Active   omeprazole (PRILOSEC) 40 MG capsule [id number] Yes Take 1 capsule (40 mg total) by mouth 2 (two) times daily.   Patient taking differently: Take 40 mg by mouth daily.   Wendie Agreste, MD Taking Active   polyethylene glycol (MIRALAX / GLYCOLAX) 17 g packet [id number] Yes Take 17 g by mouth daily as needed for mild constipation. Irving Copas, PA-C Taking Active   pravastatin (PRAVACHOL) 40 MG tablet [id number] Yes Take 1 tablet (40 mg total) by mouth daily. Wendie Agreste, MD Taking Active   QUEtiapine (SEROQUEL) 50 MG tablet [id number] Yes TAKE ONE TABLET BY MOUTH ONCE DAILY Elwanda Brooklyn, NP Taking Active   sertraline (ZOLOFT) 50 MG tablet [id number] Yes TAKE ONE TABLET BY MOUTH EVERY MORNING White, Louanna Raw, NP Taking Active   traZODone (DESYREL) 150 MG tablet [id number] Yes Take 2 tablets (300 mg total) by mouth at bedtime. Elwanda Brooklyn, NP Taking Active   Vilazodone HCl 20 MG TABS [id number] Yes Take 1 tablet (20 mg total) by mouth daily. Elwanda Brooklyn, NP Taking Active   Vitamin D, Ergocalciferol, (DRISDOL) 1.25 MG (50000 UNIT) CAPS capsule [id number] Yes Take 1 capsule (50,000 Units total) by mouth every 7 (seven) days. Sunday Wendie Agreste, MD Taking Active             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   Abnormal liver function tests 04/19/2021   Degeneration of lumbar intervertebral disc 05/25/2020   Pain  of left hip joint 03/26/2020   Constipation 05/10/2019   Change in bowel habit 05/10/2019   Dysphagia 05/10/2019   Tremor 07/12/2018   Prediabetes 07/12/2018   Polypharmacy 07/12/2018   Unintentional weight loss of more than 10 pounds 07/12/2018   OSA (obstructive sleep apnea) 06/02/2018   Fatty liver 04/18/2017   GERD (gastroesophageal reflux disease) 10/28/2016   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   Chronic throat clearing 06/24/2016   S/P partial thyroidectomy 06/16/2016   Multinodular goiter 06/02/2016   Thyroid nodule 02/08/2016   Hx of hepatitis C 05/19/2015   Vitamin D deficiency 05/19/2015    Spinal stenosis of lumbar region 05/19/2015   Hyperlipidemia 05/19/2015   Bipolar disorder ([place]) 05/19/2015   Essential hypertension 09/26/2013   Undiagnosed cardiac murmurs 09/26/2013    Immunization History  Administered Date(s) Administered   Fluad Quad(high Dose 65+) 05/23/2019, 07/13/2020, 07/19/2021   Influenza, High Dose Seasonal PF 04/27/2017, 05/15/2018   Influenza,inj,Quad PF,6+ Mos 05/19/2015, 06/17/2016   PFIZER(Purple Top)SARS-COV-2 Vaccination 11/07/2019, 11/29/2019, 06/26/2020   Pneumococcal Conjugate-13 07/12/2018   Pneumococcal Polysaccharide-23 06/17/2016   Tdap 01/29/2017    Conditions to be addressed/monitored:  HTN, GERD, HLD, Bipolar, Pre-DM, Hypothyroidism  Care Plan : General Pharmacy (Adult)  Updates made by Edythe Clarity, RPH since 05/18/2022 12:00 AM     Problem: HTN, GERD, HLD, Bipolar, Pre-DM, Hypothyroidism   Priority: High  Onset Date: 09/09/2021     Long-Range Goal: Patient-Specific Goal   Start Date: 09/09/2021  Expected End Date: 03/09/2022  Recent Progress: On track  Priority: High  Note:   Current Barriers:  Elevated LDL Unable to achieve control of BP   Pharmacist Clinical Goal(s):  Patient will verbalize ability to afford treatment regimen achieve control of BP as evidenced by monitoring adhere to plan to optimize therapeutic regimen for LDL as evidenced by report of adherence to recommended medication management changes through collaboration with PharmD and provider.   Interventions: 1:1 collaboration with Wendie Agreste, MD regarding development and update of comprehensive plan of care as evidenced by provider attestation and co-signature Inter-disciplinary care team collaboration (see longitudinal plan of care) Comprehensive medication review performed; medication list updated in electronic medical record  Hypertension (BP goal <130/80) 05/18/22 -Uncontrolled, based on some home readings but she has not been able to check  lately -Current treatment: Amlodipine 7.5mg  daily Appropriate, Query effective -Medications previously tried: candesartan, HCTZ, spironolactone  -Current home readings: BP cuff broke, top number was still 165-170 -Current dietary habits: see DM -Current exercise habits: minimal, 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  When she was checking BP after amlodipine dose increase her systolic number was 588-502.  She has not FU with PCP to have this checked.  At this time, I would consider dose increase to 10mg  or an OV for for blood pressure check. Will consult with PCP. Patient plans to buy cuff when she can afford so that she is able to check at home.  Update 01/12/22 BP fairly controlled in office yesterday.   Denies any dizziness or HA.  She has not recorded but has been monitoring at home and reports some elevated readings around 774J systolic. Have asked her to monitor and record, bring records in for  Dr. Vonna Kotyk appointment on the 22nd. If elevated at that time, would recommend increase amlodipine to 51m daily.  Hyperlipidemia: (LDL goal < 100) 05/18/22 -Not ideally controlled, most recent LDL > 100 -Current treatment: Pravastatin 445mdaily - Query Appropriate -Medications previously tried: none noted  -Current dietary patterns: see DM -Current exercise habits: minimal -Educated on Cholesterol goals;  Benefits of statin for ASCVD risk reduction; Importance of limiting foods high in cholesterol; Exercise goal of 150 minutes per week; -Recommended to continue current medication Most recent ASCVD risk is 19.4% - borderline high risk.  Due to LDL being above goal and increased BP, would recommend increase  intensity to atorvastatin 4061mn order to get her LDL to goal. Will also discuss this with PCP.  Update 01/12/22 Due for fasting lipid panel. Last lipid panel May 2022.  Would recommend increasing to high intensity statin if LDL still not at goal. She said she could fast until appointment that afternoon. She has been taking pravastatin daily with no missed doses. No changes at this time, continue current medications until fasting labs.   Pre-Diabetes (A1c goal <6.5%) -Controlled -Current medications: None -Medications previously tried: none noted  -Current home glucose readings fasting glucose: not checking post prandial glucose: not checking -Denies hypoglycemic/hyperglycemic symptoms -Current meal patterns:  breakfast: tangerine  lunch: frozen sandwich  dinner: frozen meal, sometimes home cooked meal from a neighbor snacks:  drinks: water, coffee -Current exercise: minimal -Educated on A1c and blood sugar goals; Exercise goal of 150 minutes per week; Benefits of weight loss; High salt content of frozen ready meals -Counseled to check feet daily and get yearly eye exams -Recommended to continue current medication Work on eliminating her frozen meals from diet  Bipolar (Goal: Minimize symptoms) -Controlled - followed by psychology -Current treatment: Viibryd 72m32mpropriate, Effective, Safe, Accessible Lamotrigine 200mg12m Appropriate, Effective, Safe, Accessible Quetiapine 50mg 32mopriate, Effective, Safe, Accessible -Medications previously tried/failed: Cymbalta, Seroquel -PHQ9:  PHQ9 SCORE ONLY 07/19/2021 05/27/2021 04/19/2021  PHQ-9 Total Score _0 -Educated on Benefits of medication for symptom control Possibility for high copay on Latuda -Recommended to continue current medication Assessed patient finances. Worried about Latuda copay - will determine at first fill this year and then address Recommend continue current meds - will help with copay assistance  if possible Has follow up with psychology soon  GERD (Goal: Has follow) -Controlled -Current treatment  Omeprazole 72mg d73m Appropriate, Effective, Query Safe -Medications previously tried: none noted  -Patient mentions they sometimes only use once daily Discussed appropriate timing of medication She does have symptoms if she does not take -Could consider step down based on length of therapy, will continue to discuss at further visits  Hypothyroidism (Goal: Maintain TSH) -Controlled -Current treatment  Levothyroxine 100mcg d59m Appropriate, Effective, Safe, Accessible -Medications previously tried: none noted -Most recent TSH is normal - takes med at appropriate time -Recommended to continue current medication  Patient Goals/Self-Care Activities Patient will:  - check blood pressure daily, document, and provide at future appointments target a minimum of 150 minutes of moderate intensity exercise weekly engage in dietary modifications by trying to cut back on salt content in diet from frozen foods  Follow Up Plan: The care management team will reach out to the patient again over the next 120 days.             Medication Assistance:  Possible help needed with Latuda  Compliance/Adherence/Medication fill history: Care Gaps: Cologuard  Star-Rating Drugs: Pravastatin 40mg 8/234m 90ds  Patient's preferred pharmacy is:  [facility] [place], [place] [address] Phone: [phone] Fax: [phone]  [facility] - [place], [place] - [address] [place] [address] Phone: [phone] Fax: [phone]  [id number] [place] [address] [address]Phone: [phone]: [phone] [facility] [address] [address]Phone: [phone]: [phone] Uses pill box? Yes Pt endorses 100% compliance  We discussed: Benefits of medication synchronization, packaging and delivery as well as enhanced pharmacist oversight with [facility]. Patient decided to: Utilize [facility] for medication synchronization, packaging and delivery Verbal consent obtained for [facility] enhanced pharmacy services (medication synchronization, adherence packaging, delivery coordination). A medication sync plan was created to allow patient to get all medications delivered once every 30 to 90 days per patient preference. Patient understands they have freedom to choose pharmacy and clinical pharmacist will coordinate care between all prescribers and [facility].  Care Plan and Follow Up Patient Decision:  Patient agrees to Care Plan and Follow-up.  Plan: The care management team will reach out to the patient again over the next 120 days.  CBeverly Milch PharmD Clinical Pharmacist  [facility]([phone]

## 2022-05-16 ENCOUNTER — Ambulatory Visit: Payer: PPO | Admitting: Behavioral Health

## 2022-05-18 ENCOUNTER — Ambulatory Visit (INDEPENDENT_AMBULATORY_CARE_PROVIDER_SITE_OTHER): Payer: PPO | Admitting: Pharmacist

## 2022-05-18 DIAGNOSIS — I10 Essential (primary) hypertension: Secondary | ICD-10-CM

## 2022-05-18 DIAGNOSIS — M1611 Unilateral primary osteoarthritis, right hip: Secondary | ICD-10-CM | POA: Diagnosis not present

## 2022-05-18 DIAGNOSIS — E785 Hyperlipidemia, unspecified: Secondary | ICD-10-CM

## 2022-05-18 DIAGNOSIS — Z96642 Presence of left artificial hip joint: Secondary | ICD-10-CM | POA: Diagnosis not present

## 2022-05-18 DIAGNOSIS — M25551 Pain in right hip: Secondary | ICD-10-CM | POA: Diagnosis not present

## 2022-05-18 NOTE — Patient Instructions (Addendum)
Visit Information   Goals Addressed             This Visit's Progress    Track and Manage My Blood Pressure-Hypertension   On track    Timeframe:  Long-Range Goal Priority:  High Start Date: 09/09/21                            Expected End Date: 03/09/22                      Follow Up Date 12/08/21    - check blood pressure 3 times per week - choose a place to take my blood pressure (home, clinic or office, retail store) - write blood pressure results in a log or diary    Why is this important?   You won't feel high blood pressure, but it can still hurt your blood vessels.  High blood pressure can cause heart or kidney problems. It can also cause a stroke.  Making lifestyle changes like losing a little weight or eating less salt will help.  Checking your blood pressure at home and at different times of the day can help to control blood pressure.  If the doctor prescribes medicine remember to take it the way the doctor ordered.  Call the office if you cannot afford the medicine or if there are questions about it.     Notes:        Patient Care Plan: General Pharmacy (Adult)     Problem Identified: HTN, GERD, HLD, Bipolar, Pre-DM, Hypothyroidism   Priority: High  Onset Date: 09/09/2021     Long-Range Goal: Patient-Specific Goal   Start Date: 09/09/2021  Expected End Date: 03/09/2022  Recent Progress: On track  Priority: High  Note:   Current Barriers:  Elevated LDL Unable to achieve control of BP   Pharmacist Clinical Goal(s):  Patient will verbalize ability to afford treatment regimen achieve control of BP as evidenced by monitoring adhere to plan to optimize therapeutic regimen for LDL as evidenced by report of adherence to recommended medication management changes through collaboration with PharmD and provider.   Interventions: 1:1 collaboration with Wendie Agreste, MD regarding development and update of comprehensive plan of care as evidenced by provider  attestation and co-signature Inter-disciplinary care team collaboration (see longitudinal plan of care) Comprehensive medication review performed; medication list updated in electronic medical record  Hypertension (BP goal <130/80) 05/18/22 -Uncontrolled, based on some home readings but she has not been able to check lately -Current treatment: Amlodipine 7.'5mg'$  daily Appropriate, Query effective -Medications previously tried: candesartan, HCTZ, spironolactone  -Current home readings: BP cuff broke, top number was still 165-170 -Current dietary habits: see DM -Current exercise habits: minimal, 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  When she was checking BP after amlodipine dose increase her systolic number was [id number].  She has not FU with PCP to have this checked.  At this time, I would consider dose increase to '10mg'$  or an OV for for blood pressure check. Will consult with PCP. Patient plans to buy cuff when she can afford so that she is able to check at home.  Update 01/12/22  BP fairly controlled in office yesterday.   Denies any dizziness or HA.  She has not recorded but has been monitoring at home and reports some elevated readings around 496P systolic. Have asked her to monitor and record, bring records in for Dr. Vonna Kotyk appointment on the 22nd. If elevated at that time, would recommend increase amlodipine to '10mg'$  daily.  Hyperlipidemia: (LDL goal < 100) 05/18/22 -Not ideally controlled, most recent LDL > 100 -Current treatment: Pravastatin '40mg'$  daily - Query Appropriate -Medications previously tried: none noted  -Current dietary patterns: see DM -Current exercise habits: minimal -Educated on Cholesterol  goals;  Benefits of statin for ASCVD risk reduction; Importance of limiting foods high in cholesterol; Exercise goal of 150 minutes per week; -Recommended to continue current medication Most recent ASCVD risk is 19.4% - borderline high risk.  Due to LDL being above goal and increased BP, would recommend increase intensity to atorvastatin '40mg'$  in order to get her LDL to goal. Will also discuss this with PCP.  Update 01/12/22 Due for fasting lipid panel. Last lipid panel May 2022.  Would recommend increasing to high intensity statin if LDL still not at goal. She said she could fast until appointment that afternoon. She has been taking pravastatin daily with no missed doses. No changes at this time, continue current medications until fasting labs.   Pre-Diabetes (A1c goal <6.5%) -Controlled -Current medications: None -Medications previously tried: none noted  -Current home glucose readings fasting glucose: not checking post prandial glucose: not checking -Denies hypoglycemic/hyperglycemic symptoms -Current meal patterns:  breakfast: tangerine  lunch: frozen sandwich  dinner: frozen meal, sometimes home cooked meal from a neighbor snacks:  drinks: water, coffee -Current exercise: minimal -Educated on A1c and blood sugar goals; Exercise goal of 150 minutes per week; Benefits of weight loss; High salt content of frozen ready meals -Counseled to check feet daily and get yearly eye exams -Recommended to continue current medication Work on eliminating her frozen meals from diet  Bipolar (Goal: Minimize symptoms) -Controlled - followed by psychology -Current treatment: Viibryd '20mg'$  Appropriate, Effective, Safe, Accessible Lamotrigine '200mg'$  Bid Appropriate, Effective, Safe, Accessible Quetiapine '50mg'$  Appropriate, Effective, Safe, Accessible -Medications previously tried/failed: Cymbalta, Seroquel -PHQ9:  PHQ9 SCORE ONLY 07/19/2021 05/27/2021 04/19/2021  PHQ-9 Total Score '7 17 13   '$ -Educated on Benefits of medication for symptom control Possibility for high copay on Latuda -Recommended to continue current medication Assessed patient finances. Worried about Latuda copay - will determine at first fill this year and then address Recommend continue current meds - will help with copay assistance if possible Has follow up with psychology soon  GERD (Goal: Has follow) -Controlled -Current treatment  Omeprazole '20mg'$  daily Appropriate, Effective, Query Safe -Medications previously tried: none noted  -Patient mentions they sometimes only use once daily Discussed appropriate timing of medication She does have symptoms if she does not take -Could consider step down based on length of therapy, will continue to discuss at further visits  Hypothyroidism (Goal: Maintain TSH) -Controlled -Current treatment  Levothyroxine 171mg daily Appropriate, Effective, Safe, Accessible -Medications previously tried: none noted -Most recent TSH is normal - takes med at appropriate time -Recommended to continue current medication  Patient Goals/Self-Care Activities Patient will:  - check blood pressure daily, document, and provide at future appointments target a minimum of 150 minutes of moderate intensity exercise weekly engage in dietary modifications by trying to cut back on salt content in diet from frozen foods  Follow Up Plan: The care management team will reach out  to the patient again over the next 120 days.           The patient verbalized understanding of instructions, educational materials, and care plan provided today and DECLINED offer to receive copy of patient instructions, educational materials, and care plan.  Telephone follow up appointment with pharmacy team member scheduled for: 3 months  Edythe Clarity, [facility]

## 2022-05-19 ENCOUNTER — Other Ambulatory Visit: Payer: Self-pay | Admitting: Family Medicine

## 2022-05-19 DIAGNOSIS — I10 Essential (primary) hypertension: Secondary | ICD-10-CM

## 2022-05-19 DIAGNOSIS — E785 Hyperlipidemia, unspecified: Secondary | ICD-10-CM

## 2022-05-19 MED ORDER — AMLODIPINE BESYLATE 10 MG PO TABS: 10.0000 mg | ORAL_TABLET | Freq: Every day | ORAL | 0 refills | Status: DC

## 2022-05-19 MED ORDER — ATORVASTATIN CALCIUM 10 MG PO TABS: 10.0000 mg | ORAL_TABLET | Freq: Every day | ORAL | 0 refills | Status: DC

## 2022-05-19 NOTE — Progress Notes (Unsigned)
See recent visit with pharmacist.  I agree with increasing amlodipine to 10 mg daily, and we can try atorvastatin 40 mg in place of her pravastatin.  Have her stop the 5 and 2.5 mg amlodipine, start the new dose which I will send to her pharmacy and can switch pravastatin to atorvastatin as long as she is okay with this plan.  Let me know if there are questions.

## 2022-05-20 ENCOUNTER — Other Ambulatory Visit: Payer: Self-pay | Admitting: Behavioral Health

## 2022-05-20 DIAGNOSIS — F411 Generalized anxiety disorder: Secondary | ICD-10-CM

## 2022-05-20 DIAGNOSIS — F32A Depression, unspecified: Secondary | ICD-10-CM

## 2022-05-20 DIAGNOSIS — R5383 Other fatigue: Secondary | ICD-10-CM

## 2022-05-23 NOTE — Telephone Encounter (Signed)
Try it now

## 2022-05-23 NOTE — Telephone Encounter (Signed)
Pt is scheduled on Friday with you.  Her refills are due this week.

## 2022-05-23 NOTE — Telephone Encounter (Signed)
It will not let me approve, could you change the refills on the dextro to zero. It will not allow refills.

## 2022-05-24 ENCOUNTER — Other Ambulatory Visit: Payer: Self-pay | Admitting: Family Medicine

## 2022-05-24 DIAGNOSIS — E785 Hyperlipidemia, unspecified: Secondary | ICD-10-CM

## 2022-05-24 MED ORDER — ATORVASTATIN CALCIUM 40 MG PO TABS: 40.0000 mg | ORAL_TABLET | Freq: Every day | ORAL | 1 refills | Status: DC

## 2022-05-24 NOTE — Progress Notes (Signed)
$'40mg'm$  lipitor dose sent in, inadvertently sent the 10 mg previously.

## 2022-05-25 ENCOUNTER — Telehealth: Payer: Self-pay

## 2022-05-25 NOTE — Telephone Encounter (Signed)
Received preoperative risk assessment form, requires a more recent visit with Dr [name] in order for [place] to complete. Please have pt schedule at their convenience

## 2022-05-27 ENCOUNTER — Ambulatory Visit: Payer: PPO | Admitting: Behavioral Health

## 2022-05-30 ENCOUNTER — Encounter: Payer: Self-pay | Admitting: Behavioral Health

## 2022-05-30 ENCOUNTER — Ambulatory Visit: Payer: PPO | Admitting: Behavioral Health

## 2022-05-30 DIAGNOSIS — F32A Depression, unspecified: Secondary | ICD-10-CM | POA: Diagnosis not present

## 2022-05-30 DIAGNOSIS — R5383 Other fatigue: Secondary | ICD-10-CM | POA: Diagnosis not present

## 2022-05-30 DIAGNOSIS — F319 Bipolar disorder, unspecified: Secondary | ICD-10-CM

## 2022-05-30 DIAGNOSIS — F411 Generalized anxiety disorder: Secondary | ICD-10-CM

## 2022-05-30 MED ORDER — VILAZODONE HCL 20 MG PO TABS: 20.0000 mg | ORAL_TABLET | Freq: Every day | ORAL | 2 refills | Status: DC

## 2022-05-30 NOTE — Progress Notes (Signed)
[facility]  Patient ID: Becky Lawson,  MRN: [id number]  PCP: Wendie Agreste, MD  Date of Evaluation: 05/30/2022 Time spent:20 minutes  Chief Complaint:  Chief Complaint   Depression; Anxiety; Follow-up; Medication Refill; Patient Education     HISTORY/CURRENT STATUS: HPI  Becky Lawson presents for follow-up and medication management. No manic episodes since last visit.  Fine tremors in both hand were improved today, barely noticeable.  She is very happy with how [facility] is working right now and does not want to consider any medication changes till after her hip surgery on 10/31.  Says her anxiety today is 0/10 and depression is 3/10. She was sleeping  7 hours per night with aid of medication but last night slept 0. She denies mania, no psychosis, NO SI/HI.   Past medications for mental health diagnoses include: Lithium, Seroquel, Effexor, Prozac, Latuda, Abilify, Dexedrine, Cymbalta caused weight gain.  Modafinil.       Individual Medical History/ Review of Systems: Changes? :No   Allergies: Patient has no known allergies.  Current Medications:  Current Outpatient Medications:    ALPRAZolam (XANAX) 1 MG tablet, TAKE 1/2 TO 1 TABLET BY MOUTH twice daily AS NEEDED FOR ANXIETY, Disp: 45 tablet, Rfl: 3   amLODipine (NORVASC) 10 MG tablet, Take 1 tablet (10 mg total) by mouth daily., Disp: 90 tablet, Rfl: 0   atorvastatin (LIPITOR) 40 MG tablet, Take 1 tablet (40 mg total) by mouth daily., Disp: 90 tablet, Rfl: 1   azelastine (ASTELIN) 0.1 % nasal spray, Place 1-2 sprays into both nostrils 2 (two) times daily. Use in each nostril as directed, Disp: 30 mL, Rfl: 3   dextroamphetamine (DEXTROSTAT) 10 MG tablet, TAKE ONE TABLET BY MOUTH every morning, Disp: 30 tablet, Rfl: 0   docusate sodium (COLACE) 100 MG capsule, Take 1 capsule (100 mg total) by mouth 2 (two) times daily., Disp: 10 capsule, Rfl: 0   hydrOXYzine (ATARAX) 25 MG tablet, Take 1 tablet (25 mg total) by  mouth every 8 (eight) hours as needed., Disp: 60 tablet, Rfl: 5   lamoTRIgine (LAMICTAL) 100 MG tablet, TAKE TWO TABLETS BY MOUTH EVERY MORNING and TAKE ONE TABLET BY MOUTH EVERY EVENING, Disp: 270 tablet, Rfl: 0   levothyroxine (SYNTHROID) 100 MCG tablet, TAKE ONE TABLET BY MOUTH daily BEFORE breakfast, Disp: 90 tablet, Rfl: 1   mirtazapine (REMERON) 7.5 MG tablet, TAKE 1-2 TABLETS (15 MG TOTAL) AT BEDTIME AS NEEDED FOR SLEEP., Disp: 180 tablet, Rfl: 0   omeprazole (PRILOSEC) 40 MG capsule, Take 1 capsule (40 mg total) by mouth 2 (two) times daily. (Patient taking differently: Take 40 mg by mouth daily.), Disp: 180 capsule, Rfl: 1   polyethylene glycol (MIRALAX / GLYCOLAX) 17 g packet, Take 17 g by mouth daily as needed for mild constipation., Disp: 14 each, Rfl: 0   QUEtiapine (SEROQUEL) 50 MG tablet, TAKE ONE TABLET BY MOUTH ONCE DAILY, Disp: 90 tablet, Rfl: 0   sertraline (ZOLOFT) 50 MG tablet, TAKE ONE TABLET BY MOUTH EVERY MORNING, Disp: 90 tablet, Rfl: 0   traZODone (DESYREL) 150 MG tablet, Take 2 tablets (300 mg total) by mouth at bedtime., Disp: 180 tablet, Rfl: 1   Vilazodone HCl 20 MG TABS, Take 1 tablet (20 mg total) by mouth daily., Disp: 30 tablet, Rfl: 2   Vitamin D, Ergocalciferol, (DRISDOL) 1.25 MG (50000 UNIT) CAPS capsule, Take 1 capsule (50,000 Units total) by mouth every 7 (seven) days. Sunday, Disp: 12 capsule, Rfl: 3  Current  Facility-Administered Medications:    cyanocobalamin ((VITAMIN B-12)) injection 1,000 mcg, 1,000 mcg, Intramuscular, Q30 days, Shawnee Knapp, MD, 1,000 mcg at 07/12/18 1652 Medication Side Effects: none  Family Medical/ Social History: Changes? No  MENTAL HEALTH EXAM:  There were no vitals taken for this visit.There is no height or weight on file to calculate BMI.  General Appearance: Casual, Neat, and Well Groomed  Eye Contact:  Good  Speech:  Clear and Coherent  Volume:  Normal  Mood:  Anxious and Depressed  Affect:  Appropriate  Thought  Process:  Coherent  Orientation:  Full (Time, Place, and Person)  Thought Content: Logical   Suicidal Thoughts:  No  Homicidal Thoughts:  No  Memory:  WNL  Judgement:  Good  Insight:  Good  Psychomotor Activity:  Normal  Concentration:  Concentration: Good  Recall:  Good  Fund of Knowledge: Good  Language: Good  Assets:  Desire for Improvement  ADL's:  Intact  Cognition: WNL  Prognosis:  Good    DIAGNOSES:    ICD-10-CM   1. Bipolar disorder with depression ([place])  F31.9     2. Generalized anxiety disorder  F41.1 Vilazodone HCl 20 MG TABS    3. Fatigue, unspecified type  R53.83     4. Melancholy  F32.A Vilazodone HCl 20 MG TABS    5. Bipolar depression ([place])  F31.9 Vilazodone HCl 20 MG TABS      Receiving Psychotherapy: No    RECOMMENDATIONS:  Greater than 50% of 30 min of face to face time  was spent on counseling and coordination of care. She is very happy with the [facility] and feels like it is stating to help with depression. She is scheduled for hip replacement surgery on 10/31 and will follow up 1 week after surgery to reassess.   -Conducted AIMS  Score of 1  today.     Continue Seroquel 50 mg daily at bedtime Continue hydroxyzine 25 mg nightly 8H as needed. Continue Lamictal  300 mg daily. 200 mg in the am and 100 mg in the evening. Continue Viibryd 20 mg daily Continue the  trazodone 300 mg, 1.5 pills nightly. Continue Mirtazapine 7.5 mg at bedtime. May increase to 15 mg if needed.  Continue Xanax 1 mg at bedtime but if taking Return in 5 weeks to reassess.  Discussed potential benefits, risks, and side effects of stimulants with patient to include increased heart rate, palpitations, insomnia, increased anxiety, increased irritability, or decreased appetite.  Instructed patient to contact office if experiencing any significant tolerability issues.  Discussed potential metabolic side effects associated with atypical antipsychotics, as well as potential risk for  movement side effects. Advised pt to contact office if movement side effects occur.   PDMP was reviewed    Elwanda [place], NP

## 2022-06-04 DIAGNOSIS — F319 Bipolar disorder, unspecified: Secondary | ICD-10-CM

## 2022-06-04 DIAGNOSIS — I10 Essential (primary) hypertension: Secondary | ICD-10-CM

## 2022-06-04 DIAGNOSIS — E039 Hypothyroidism, unspecified: Secondary | ICD-10-CM | POA: Diagnosis not present

## 2022-06-04 DIAGNOSIS — E785 Hyperlipidemia, unspecified: Secondary | ICD-10-CM

## 2022-06-13 ENCOUNTER — Ambulatory Visit (INDEPENDENT_AMBULATORY_CARE_PROVIDER_SITE_OTHER): Payer: PPO | Admitting: Family Medicine

## 2022-06-13 ENCOUNTER — Encounter: Payer: Self-pay | Admitting: Family Medicine

## 2022-06-13 VITALS — BP 130/62 | HR 83 | Temp 98.5°F | Ht 64.5 in | Wt 194.8 lb

## 2022-06-13 DIAGNOSIS — Z23 Encounter for immunization: Secondary | ICD-10-CM | POA: Diagnosis not present

## 2022-06-13 DIAGNOSIS — R011 Cardiac murmur, unspecified: Secondary | ICD-10-CM

## 2022-06-13 DIAGNOSIS — G4733 Obstructive sleep apnea (adult) (pediatric): Secondary | ICD-10-CM | POA: Diagnosis not present

## 2022-06-13 DIAGNOSIS — Z01818 Encounter for other preprocedural examination: Secondary | ICD-10-CM | POA: Diagnosis not present

## 2022-06-13 DIAGNOSIS — R7303 Prediabetes: Secondary | ICD-10-CM | POA: Diagnosis not present

## 2022-06-13 NOTE — Patient Instructions (Signed)
Please have x-ray and lab work done at [facility] in 8:30-4:30 during weekdays, no appointment needed [place].  [place], [place] [id number]  I have ordered an echocardiogram for the heart murmur.  Hopefully should have those results back along with the other labs and x-ray to complete your paperwork for your surgeon soon.  No medication changes today.  Let me know if there are questions and thank you for coming in today.

## 2022-06-13 NOTE — Progress Notes (Signed)
Subjective:  Patient ID: Becky Lawson, female    DOB: [birth year]  Age: 70 y.o. MRN: [id number]  CC:  Chief Complaint  Patient presents with   surgical clear   Depression    PHQ9 - 8     HPI Becky Lawson presents for   Preoperative evaluation. R hip replacement planned 07/05/22. L hip replacement without  anesthesia difficulty in 2022.   Plan for conscious sedation with spinal block.  No hx of MI, CHF, CKD.  Prediabetes, but not diabetic, no insulin. Diagnosed with sleep apnea in past - mild. Unable to tolerate CPAP machine. overall AHI 10 on study in 2019. O2 nadir 83%, mean 90%.   Lab Results  Component Value Date   HGBA1C 5.0 07/12/2018   Lab Results  Component Value Date   CREATININE 0.74 01/24/2022   Hx of hypothroidism.  Lab Results  Component Value Date   TSH 0.76 01/24/2022   Walking in [place] for 1 hour - no CP/dyspnea.  Able to walk stairs without CP/dyspnea - limited by hip pain only.   Lab Results  Component Value Date   TSH 0.76 01/24/2022  On synthroid for hypothyroidism.        06/13/2022    3:43 PM 01/24/2022    2:34 PM 01/10/2022    2:50 PM 12/16/2021    4:01 PM 12/16/2021    4:00 PM  Depression screen PHQ 2/9  Decreased Interest 2 2 0 0 0  Down, Depressed, Hopeless 2 2 0 0 0  PHQ - 2 Score 4 4 0 0 0  Altered sleeping 0 3 0    Tired, decreased energy 3 3 0    Change in appetite 0 0 0    Feeling bad or failure about yourself  1 0 0    Trouble concentrating 0 0 0    Moving slowly or fidgety/restless 0 0 0    Suicidal thoughts 0 0 0    PHQ-9 Score 8 10 0    Difficult doing work/chores   Not difficult at all    History of known depression followed by psychiatry, including medication management.   On statin for HLD. Chronic med follow up in November. Atorvastatin few weeks only.  Has been told of murmur in past - no prior imaging.    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   Chronic throat clearing 06/24/2016   S/P partial thyroidectomy 06/16/2016   Multinodular goiter 06/02/2016   Thyroid nodule 02/08/2016   Hx of hepatitis C 05/19/2015   Vitamin D deficiency 05/19/2015   Spinal stenosis of lumbar region 05/19/2015   Hyperlipidemia 05/19/2015   Bipolar disorder ([place]) 05/19/2015   Essential hypertension 09/26/2013   Undiagnosed cardiac murmurs 09/26/2013   Past Medical History:  Diagnosis Date   Arthritis    Benign cyst of right kidney 2014   2.7 cm right kidney cyst seen on imaging 2014 but was  c/o right flank pain with new persistent proteinuria (fortunately hematuria had resolved) so repeat US 11/2016 showed right kidney still with 2.6 cm simple cyst and otherwise nml.   Bipolar 1 disorder ([place])    Cancer ([place])    Phreesia 06/15/2020   Chronic kidney disease    Depression    Depression    Phreesia 06/15/2020   GERD (gastroesophageal reflux disease)    Heart murmur    Hepatitis C    resolved completely after treatment in 2014, genotype 1b, followed at [facility]   Hyperlipidemia    Hypertension    Hypothyroidism    Jaundice 11/01/2012   Pruritic disorder 02/13/2013   Sleep apnea    Was diagnosed approximately 20 years ago, but does not wear CPAP patient stated "I gave it back.Marland KitchenMarland KitchenI couldn't wear that thing it was horrible"   Spinal stenosis    Thyroid disease    Phreesia 06/15/2020   Past Surgical History:  Procedure  Laterality Date   ABDOMINAL HYSTERECTOMY     COLONOSCOPY W/ POLYPECTOMY     JOINT REPLACEMENT Bilateral    knee   KNEE ARTHROSCOPY Bilateral    PARTIAL HYSTERECTOMY     THYROID LOBECTOMY Left 06/16/2016   THYROID LOBECTOMY Left 06/16/2016   Procedure: LEFT THYROID LOBECTOMY;  Surgeon: Greer Pickerel, MD;  Location: [facility];  Service: General;  Laterality: Left;   TOTAL HIP ARTHROPLASTY Left 08/03/2021   Procedure: TOTAL HIP ARTHROPLASTY ANTERIOR APPROACH;  Surgeon: Paralee Cancel, MD;  Location: [facility];  Service: Orthopedics;  Laterality: Left;   No Known Allergies Prior to Admission medications   Medication Sig Start Date End Date Taking? Authorizing Provider  ALPRAZolam (XANAX) 1 MG tablet TAKE 1/2 TO 1 TABLET BY MOUTH twice daily AS NEEDED FOR ANXIETY 05/23/22  Yes White, Brian A, NP  amLODipine (NORVASC) 10 MG tablet Take 1 tablet (10 mg total) by mouth daily. 05/19/22  Yes Wendie Agreste, MD  atorvastatin (LIPITOR) 40 MG tablet Take 1 tablet (40 mg total) by mouth daily. 05/24/22  Yes Wendie Agreste, MD  azelastine (ASTELIN) 0.1 % nasal spray Place 1-2 sprays into both nostrils 2 (two) times daily. Use in each nostril as directed 02/09/22  Yes Wendie Agreste, MD  dextroamphetamine (DEXTROSTAT) 10 MG tablet TAKE ONE TABLET BY MOUTH every morning 05/23/22  Yes Lesle Chris A, NP  hydrOXYzine (ATARAX) 25 MG tablet Take 1 tablet (25 mg total) by mouth every 8 (eight) hours as needed. 12/20/21  Yes White, Louanna Raw, NP  lamoTRIgine (LAMICTAL) 100 MG tablet TAKE TWO TABLETS BY MOUTH EVERY MORNING and TAKE ONE TABLET BY MOUTH EVERY EVENING 04/19/22  Yes Lesle Chris A, NP  levothyroxine (SYNTHROID) 100 MCG tablet TAKE ONE TABLET BY MOUTH daily BEFORE breakfast 01/26/22  Yes Wendie Agreste, MD  omeprazole (PRILOSEC) 40 MG capsule Take 1 capsule (40 mg total) by mouth 2 (two) times daily. 09/23/21  Yes Wendie Agreste, MD  polyethylene glycol (MIRALAX / GLYCOLAX) 17 g packet Take 17 g by mouth daily  as needed for mild constipation. 08/04/21  Yes Irving Copas, PA-C  traZODone (DESYREL) 150 MG tablet Take 2 tablets (300 mg total) by mouth at bedtime. 12/24/21  Yes White, Aaron Edelman A, NP  Vilazodone HCl 20 MG TABS Take 1 tablet (20 mg total) by mouth daily. 05/30/22  Yes White, Louanna Raw, NP  Vitamin D, Ergocalciferol, (DRISDOL) 1.25 MG (50000 UNIT) CAPS capsule Take 1 capsule (50,000 Units total) by mouth every 7 (  seven) days. Sunday 12/28/21  Yes Wendie Agreste, MD  docusate sodium (COLACE) 100 MG capsule Take 1 capsule (100 mg total) by mouth 2 (two) times daily. 08/04/21   Irving Copas, PA-C  mirtazapine (REMERON) 7.5 MG tablet TAKE 1-2 TABLETS (15 MG TOTAL) AT BEDTIME AS NEEDED FOR SLEEP. Patient not taking: Reported on 06/13/2022 03/07/22   Elwanda Brooklyn, NP  QUEtiapine (SEROQUEL) 50 MG tablet TAKE ONE TABLET BY MOUTH ONCE DAILY Patient not taking: Reported on 06/13/2022 04/19/22   Elwanda Brooklyn, NP  sertraline (ZOLOFT) 50 MG tablet TAKE ONE TABLET BY MOUTH EVERY MORNING 04/19/22   White, Louanna Raw, NP   Social History   Socioeconomic History   Marital status: Divorced    Spouse name: Not on file   Number of children: Not on file   Years of education: Not on file   Highest education level: Not on file  Occupational History   Occupation: Personal Caregiver  Tobacco Use   Smoking status: Former    Packs/day: 1.00    Years: 15.00    Total pack years: 15.00    Types: Cigarettes    Quit date: 06/30/1979    Years since quitting: 42.9   Smokeless tobacco: Never  Vaping Use   Vaping Use: Never used  Substance and Sexual Activity   Alcohol use: Not Currently    Alcohol/week: 0.0 - 1.0 standard drinks of alcohol   Drug use: No   Sexual activity: Yes    Birth control/protection: None  Other Topics Concern   Not on file  Social History Narrative   Divorced. Education: [employer]. Exercise: No.   Left handed   Lives alone   Caffeine: 1/4-1/2 cup/day   Social Determinants of Health    Financial Resource Strain: Low Risk  (12/16/2021)   Overall Financial Resource Strain (CARDIA)    Difficulty of Paying Living Expenses: Not hard at all  Food Insecurity: No Food Insecurity (12/16/2021)   Hunger Vital Sign    Worried About Running Out of Food in the Last Year: Never true    Ran Out of Food in the Last Year: Never true  Transportation Needs: No Transportation Needs (12/16/2021)   PRAPARE - Hydrologist (Medical): No    Lack of Transportation (Non-Medical): No  Physical Activity: Insufficiently Active (12/16/2021)   Exercise Vital Sign    Days of Exercise per Week: 2 days    Minutes of Exercise per Session: 60 min  Stress: No Stress Concern Present (12/16/2021)   [place]    Feeling of Stress : Not at all  Social Connections: Moderately Isolated (12/16/2021)   Social Connection and Isolation Panel [NHANES]    Frequency of Communication with Friends and Family: Twice a week    Frequency of Social Gatherings with Friends and Family: Twice a week    Attends Religious Services: More than 4 times per year    Active Member of [employer] or Organizations: No    Attends Archivist Meetings: Never    Marital Status: Divorced  Human resources officer Violence: Not At Risk (12/16/2021)   Humiliation, Afraid, Rape, and Kick questionnaire    Fear of Current or Ex-Partner: No    Emotionally Abused: No    Physically Abused: No    Sexually Abused: No    Review of Systems Per HPI.   Objective:   Vitals:   06/13/22 1546  BP: 130/62  Pulse:  83  Temp: 98.5 F (36.9 C)  SpO2: 96%  Weight: 194 lb 12.8 oz (88.4 kg)  Height: 5' 4.5" (1.638 m)     Physical Exam Vitals reviewed.  Constitutional:      Appearance: Normal appearance. She is well-developed.  HENT:     Head: Normocephalic and atraumatic.  Eyes:     Conjunctiva/sclera: Conjunctivae normal.     Pupils: Pupils are equal,  round, and reactive to light.  Neck:     Vascular: No carotid bruit.  Cardiovascular:     Rate and Rhythm: Normal rate and regular rhythm.     Heart sounds: Murmur (2-3/6 SEM.) heard.     No friction rub. No gallop.  Pulmonary:     Effort: Pulmonary effort is normal. No respiratory distress.     Breath sounds: Normal breath sounds. No stridor. No wheezing or rales.  Abdominal:     Palpations: Abdomen is soft. There is no pulsatile mass.     Tenderness: There is no abdominal tenderness.  Musculoskeletal:     Right lower leg: No edema.     Left lower leg: No edema.  Skin:    General: Skin is warm and dry.  Neurological:     Mental Status: She is alert and oriented to person, place, and time.  Psychiatric:        Mood and Affect: Mood normal.        Behavior: Behavior normal.     EKG, sinus rhythm, rate 89.  Some baseline artifact noted but no appreciable changes from EKG 07/21/2021.  No acute ST or T wave changes.  Assessment & Plan:  Becky Lawson is a 71 y.o. female . Preop examination - Plan: EKG 12-Lead, ECHOCARDIOGRAM COMPLETE, Comprehensive metabolic panel, CBC, Hemoglobin A1c, DG Chest 2 View  -Murmur noted as above, check echo, denies cardiac symptoms, and by RCRI Goldman criteria does not appear to need any further cardiac testing.  If no significant concern on echo will provide information for her surgeon.  Of note she does have obstructive sleep apnea but mild, not currently using CPAP.  We will need to advise anesthesia.  Screening labs also obtained as well as chest x-ray.  Need for influenza vaccination - Plan: Flu Vaccine QUAD High Dose(Fluad)  OSA (obstructive sleep apnea)  Heart murmur - Plan: ECHOCARDIOGRAM COMPLETE, DG Chest 2 View  Prediabetes - Plan: Hemoglobin A1c    No orders of the defined types were placed in this encounter.  Patient Instructions  Please have x-ray and lab work done at [facility] in 8:30-4:30 during weekdays, no appointment  needed [place].  [place], [place] [address]  I have ordered an echocardiogram for the heart murmur.  Hopefully should have those results back along with the other labs and x-ray to complete your paperwork for your surgeon soon.  No medication changes today.  Let me know if there are questions and thank you for coming in today.     Signed,   Merri Ray, MD [facility] 06/13/22 5:04 PM

## 2022-06-17 ENCOUNTER — Other Ambulatory Visit: Payer: Self-pay | Admitting: Behavioral Health

## 2022-06-17 DIAGNOSIS — F32A Depression, unspecified: Secondary | ICD-10-CM

## 2022-06-17 DIAGNOSIS — R5383 Other fatigue: Secondary | ICD-10-CM

## 2022-06-17 NOTE — Telephone Encounter (Signed)
Last filled 9/19 per pharmacy, but isn't showing in the database. Due 10/17.

## 2022-06-21 ENCOUNTER — Other Ambulatory Visit: Payer: Self-pay | Admitting: Family Medicine

## 2022-06-21 ENCOUNTER — Ambulatory Visit (HOSPITAL_BASED_OUTPATIENT_CLINIC_OR_DEPARTMENT_OTHER)
Admission: RE | Admit: 2022-06-21 | Discharge: 2022-06-21 | Disposition: A | Discharge status: Home / Self Care | Payer: PPO | Source: Ambulatory Visit | Attending: Family Medicine | Admitting: Family Medicine

## 2022-06-21 ENCOUNTER — Encounter: Payer: Self-pay | Admitting: Family Medicine

## 2022-06-21 DIAGNOSIS — R011 Cardiac murmur, unspecified: Secondary | ICD-10-CM | POA: Insufficient documentation

## 2022-06-21 DIAGNOSIS — Z1231 Encounter for screening mammogram for malignant neoplasm of breast: Secondary | ICD-10-CM

## 2022-06-21 DIAGNOSIS — Z01818 Encounter for other preprocedural examination: Secondary | ICD-10-CM | POA: Diagnosis not present

## 2022-06-21 LAB — ECHOCARDIOGRAM COMPLETE
AR max vel: 2.39 cm2
AV Area VTI: 2.76 cm2
AV Area mean vel: 2.14 cm2
AV Mean grad: 5 mmHg
AV Peak grad: 8.1 mmHg
Ao pk vel: 1.42 m/s
Area-P 1/2: 3.53 cm2
S' Lateral: 3 cm

## 2022-06-21 NOTE — Progress Notes (Signed)
  Echocardiogram 2D Echocardiogram has been performed.  Becky Lawson F 06/21/2022, 4:54 PM

## 2022-06-22 NOTE — Progress Notes (Addendum)
COVID Vaccine received:  '[]'$  No '[x]'$  Yes  X3 Date of any COVID positive Test in last 90 days:  PCP - Dr. Merri Ray Cardiologist - none  Chest x-ray - n/a EKG -  07-21-2021 Stress Test - n/a ECHO - 06-21-22 Cardiac Cath - n/a  Pacemaker/ICD device     '[x]'$  N/A Spinal Cord Stimulator:'[x]'$  No '[]'$  Yes      (Remind patient to bring remote DOS) Other Implants:   History of Sleep Apnea? '[]'$  No '[x]'$  Yes   Sleep Study Date:  2019 CPAP used?- '[x]'$  No can't tolerate  Does the patient monitor blood sugar? '[]'$  No '[]'$  Yes  '[x]'$  N/A  Blood Thinner Instructions: none Aspirin Instructions: none Last Dose:  ERAS Protocol Ordered: '[]'$  No  '[x]'$  Yes PRE-SURGERY '[x]'$  ENSURE  '[]'$  G2   Comments: Patient lives alone but says that her "church members" will be available to take care of her when she is discharged. Her Daughter will be bringing her to the hospital and will take her home, but she will not be staying with her.   PCR SCREEN- Positive for BOTH MRSA and STAPH. This result was routed to Dr. Alvan Dame on 06-24-22.   Activity level: Patient can not climb a flight of stairs without difficulty; '[x]'$  No CP  '[x]'$  No SOB,  but would have severe leg pain. She is able to do ADLs. She walks with a rollator.   Anesthesia review: CKD, Heart Murmur (Echo 06-21-2022), HTN,  Bipolar I  Patient denies shortness of breath, fever, cough and chest pain at PAT appointment.  Patient verbalized understanding and agreement to the Pre-Surgical Instructions that were given to them at this PAT appointment. Patient was also educated of the need to review these PAT instructions again prior to his/her surgery.I reviewed the appropriate phone numbers to call if they have any and questions or concerns.

## 2022-06-22 NOTE — Patient Instructions (Signed)
SURGICAL WAITING ROOM VISITATION Patients having surgery or a procedure may have no more than 2 support people in the waiting area - these visitors may rotate in the visitor waiting room.   Children under the age of 41 must have an adult with them who is not the patient. If the patient needs to stay at the hospital during part of their recovery, the visitor guidelines for inpatient rooms apply.  PRE-OP VISITATION  Pre-op nurse will coordinate an appropriate time for 1 support person to accompany the patient in pre-op.  This support person may not rotate.  This visitor will be contacted when the time is appropriate for the visitor to come back in the pre-op area.  Please refer to the [facility] website for the visitor guidelines for Inpatients (after your surgery is over and you are in a regular room).  You are not required to quarantine at this time prior to your surgery. However, you must do this: Hand Hygiene often Do NOT share personal items Notify your provider if you are in close contact with someone who has COVID or you develop fever 100.4 or greater, new onset of sneezing, cough, sore throat, shortness of breath or body aches.   If you received a COVID test during your pre-op visit  it is requested that you wear a mask when out in public, stay away from anyone that may not be feeling well and notify your surgeon if you develop symptoms. If you test positive for Covid or have been in contact with anyone that has tested positive in the last 10 days please notify you surgeon.       Your procedure is scheduled on:  Tuesday July 05, 2022  Report to [facility] Main Entrance.  Report to admitting at: 11:45  AM  +++++Call this number if you have any questions or problems the morning of surgery [phone]  Do not eat food :After Midnight the night prior to your surgery/procedure.  After Midnight you may have the following liquids until   11:15 AM DAY OF SURGERY  Clear  Liquid Diet Water Black Coffee (sugar ok, NO MILK/CREAM OR CREAMERS)  Tea (sugar ok, NO MILK/CREAM OR CREAMERS) regular and decaf                             Plain Jell-O  with no fruit (NO RED)                                           Fruit ices (not with fruit pulp, NO RED)                                     Popsicles (NO RED)                                                                  Juice: apple, WHITE grape, WHITE cranberry Sports drinks like Gatorade or Powerade (NO RED)  The day of surgery:  Drink ONE (1) Pre-Surgery Clear Ensure at  11:15  AM the morning of surgery. Drink in one sitting. Do not sip.  This drink was given to you during your hospital pre-op appointment visit. Nothing else to drink after completing the Pre-Surgery Clear Ensure : No candy, chewing gum or throat lozenges.    FOLLOW ANY ADDITIONAL PRE OP INSTRUCTIONS YOU RECEIVED FROM YOUR SURGEON'S OFFICE!!!   Oral Hygiene is also important to reduce your risk of infection.        Remember - BRUSH YOUR TEETH THE MORNING OF SURGERY WITH YOUR REGULAR TOOTHPASTE   Take ONLY these medicines the morning of surgery with A SIP OF WATER: Lamictal, Vilazodone, Amlodipine, Synthroid                 You may not have any metal on your body including hair pins, jewelry, and body piercing  Do not wear make-up, lotions, powders, perfumes or deodorant   Do not wear nail polish including gel and S&S, artificial / acrylic nails, or any other type of covering on natural nails including finger and toenails. If you have artificial nails, gel coating, etc., that needs to be removed by a nail salon, Please have this removed prior to surgery. Not doing so may mean that your surgery could be cancelled or delayed if the Surgeon or anesthesia staff feels like they are unable to monitor you safely.   Do not shave 48 hours prior to surgery to avoid nicks in your skin which may contribute to postoperative infections.     Contacts, Hearing Aids, dentures or bridgework may not be worn into surgery.   You may bring a small overnight bag with you on the day of surgery, only pack items that are not valuable .[facility] IS NOT RESPONSIBLE   FOR VALUABLES THAT ARE LOST OR STOLEN.   DO NOT [place]. PHARMACY WILL DISPENSE MEDICATIONS LISTED ON YOUR MEDICATION LIST TO YOU DURING YOUR ADMISSION [place]!    Special Instructions: Bring a copy of your healthcare power of attorney and living will documents the day of surgery, if you wish to have them scanned into your [facility] Medical Records- EPIC  Please read over the following fact sheets you were given: IF YOU HAVE QUESTIONS ABOUT YOUR PRE-OP INSTRUCTIONS, PLEASE CALL [phone]  ([place])   [facility] - Preparing for Surgery Before surgery, you can play an important role.  Because skin is not sterile, your skin needs to be as free of germs as possible.  You can reduce the number of germs on your skin by washing with CHG (chlorahexidine gluconate) soap before surgery.  CHG is an antiseptic cleaner which kills germs and bonds with the skin to continue killing germs even after washing. Please DO NOT use if you have an allergy to CHG or antibacterial soaps.  If your skin becomes reddened/irritated stop using the CHG and inform your nurse when you arrive at Short Stay. Do not shave (including legs and underarms) for at least 48 hours prior to the first CHG shower.  You may shave your face/neck.  Please follow these instructions carefully:  1.  Shower with CHG Soap the night before surgery and the  morning of surgery.  2.  If you choose to wash your hair, wash your hair first as usual with your normal  shampoo.  3.  After you shampoo, rinse your hair and body thoroughly to remove the  shampoo.                             4.  Use CHG as you would any other liquid soap.  You can apply chg directly to the skin and wash.  Gently  with a scrungie or clean washcloth.  5.  Apply the CHG Soap to your body ONLY FROM THE NECK DOWN.   Do not use on face/ open                           Wound or open sores. Avoid contact with eyes, ears mouth and genitals (private parts).                       Wash face,  Genitals (private parts) with your normal soap.             6.  Wash thoroughly, paying special attention to the area where your  surgery  will be performed.  7.  Thoroughly rinse your body with warm water from the neck down.  8.  DO NOT shower/wash with your normal soap after using and rinsing off the CHG Soap.            9.  Pat yourself dry with a clean towel.            10.  Wear clean pajamas.            11.  Place clean sheets on your bed the night of your first shower and do not  sleep with pets.  ON THE DAY OF SURGERY : Do not apply any lotions/deodorants the morning of surgery.  Please wear clean clothes to the hospital/surgery center.    FAILURE TO FOLLOW THESE INSTRUCTIONS MAY RESULT IN THE CANCELLATION OF YOUR SURGERY  PATIENT SIGNATURE_________________________________  NURSE SIGNATURE__________________________________  ________________________________________________________________________        Adam Phenix    An incentive spirometer is a tool that can help keep your lungs clear and active. This tool measures how well you are filling your lungs with each breath. Taking long deep breaths may help reverse or decrease the chance of developing breathing (pulmonary) problems (especially infection) following: A long period of time when you are unable to move or be active. BEFORE THE PROCEDURE  If the spirometer includes an indicator to show your best effort, your nurse or respiratory therapist will set it to a desired goal. If possible, sit up straight or lean slightly forward. Try not to slouch. Hold the incentive spirometer in an upright position. INSTRUCTIONS FOR USE  Sit on the edge of  your bed if possible, or sit up as far as you can in bed or on a chair. Hold the incentive spirometer in an upright position. Breathe out normally. Place the mouthpiece in your mouth and seal your lips tightly around it. Breathe in slowly and as deeply as possible, raising the piston or the ball toward the top of the column. Hold your breath for 3-5 seconds or for as long as possible. Allow the piston or ball to fall to the bottom of the column. Remove the mouthpiece from your mouth and breathe out normally. Rest for a few seconds and repeat Steps 1 through 7 at least 10 times every 1-2 hours when you are awake. Take your time and take a few normal breaths between deep breaths. The spirometer may include  an indicator to show your best effort. Use the indicator as a goal to work toward during each repetition. After each set of 10 deep breaths, practice coughing to be sure your lungs are clear. If you have an incision (the cut made at the time of surgery), support your incision when coughing by placing a pillow or rolled up towels firmly against it. Once you are able to get out of bed, walk around indoors and cough well. You may stop using the incentive spirometer when instructed by your caregiver.  RISKS AND COMPLICATIONS Take your time so you do not get dizzy or light-headed. If you are in pain, you may need to take or ask for pain medication before doing incentive spirometry. It is harder to take a deep breath if you are having pain. AFTER USE Rest and breathe slowly and easily. It can be helpful to keep track of a log of your progress. Your caregiver can provide you with a simple table to help with this. If you are using the spirometer at home, follow these instructions: [place] IF:  You are having difficultly using the spirometer. You have trouble using the spirometer as often as instructed. Your pain medication is not giving enough relief while using the spirometer. You develop  fever of 100.5 F (38.1 C) or higher.                                                                                                    SEEK IMMEDIATE MEDICAL CARE IF:  You cough up bloody sputum that had not been present before. You develop fever of 102 F (38.9 C) or greater. You develop worsening pain at or near the incision site. MAKE SURE YOU:  Understand these instructions. Will watch your condition. Will get help right away if you are not doing well or get worse. Document Released: 01/02/2007 Document Revised: 11/14/2011 Document Reviewed: 03/05/2007 [facility] Patient Information 2014 [place], [place].

## 2022-06-23 ENCOUNTER — Other Ambulatory Visit: Payer: Self-pay

## 2022-06-23 ENCOUNTER — Encounter (HOSPITAL_COMMUNITY): Payer: Self-pay

## 2022-06-23 ENCOUNTER — Encounter (HOSPITAL_COMMUNITY)
Admission: RE | Admit: 2022-06-23 | Discharge: 2022-06-23 | Disposition: A | Discharge status: Home / Self Care | Payer: PPO | Source: Ambulatory Visit | Attending: Orthopedic Surgery | Admitting: Orthopedic Surgery

## 2022-06-23 VITALS — BP 143/77 | HR 98 | Temp 99.1°F | Resp 22 | Ht 64.5 in | Wt 190.0 lb

## 2022-06-23 DIAGNOSIS — Z01812 Encounter for preprocedural laboratory examination: Secondary | ICD-10-CM | POA: Insufficient documentation

## 2022-06-23 DIAGNOSIS — I10 Essential (primary) hypertension: Secondary | ICD-10-CM | POA: Diagnosis not present

## 2022-06-23 DIAGNOSIS — M1611 Unilateral primary osteoarthritis, right hip: Secondary | ICD-10-CM | POA: Diagnosis not present

## 2022-06-23 DIAGNOSIS — Z01818 Encounter for other preprocedural examination: Secondary | ICD-10-CM

## 2022-06-23 LAB — BASIC METABOLIC PANEL
Anion gap: 8 (ref 5–15)
BUN: 15 mg/dL (ref 8–23)
CO2: 26 mmol/L (ref 22–32)
Calcium: 9.2 mg/dL (ref 8.9–10.3)
Chloride: 106 mmol/L (ref 98–111)
Creatinine, Ser: 0.75 mg/dL (ref 0.44–1.00)
GFR, Estimated: >60 mL/min (ref >60)
Glucose, Bld: 129 mg/dL — ABNORMAL HIGH (ref 70–99)
Potassium: 4.2 mmol/L (ref 3.5–5.1)
Sodium: 140 mmol/L (ref 135–145)

## 2022-06-23 LAB — TYPE AND SCREEN
ABO/RH(D): A NEG
Antibody Screen: NEGATIVE

## 2022-06-23 LAB — CBC
HCT: 41.1 % (ref 36.0–46.0)
Hemoglobin: 13.5 g/dL (ref 12.0–15.0)
MCH: 30.8 pg (ref 26.0–34.0)
MCHC: 32.8 g/dL (ref 30.0–36.0)
MCV: 93.8 fL (ref 80.0–100.0)
Platelets: 266 10*3/uL (ref 150–400)
RBC: 4.38 MIL/uL (ref 3.87–5.11)
RDW: 12.5 % (ref 11.5–15.5)
WBC: 7 10*3/uL (ref 4.0–10.5)
nRBC: 0 % (ref 0.0–0.2)

## 2022-06-24 ENCOUNTER — Telehealth: Payer: Self-pay | Admitting: Family Medicine

## 2022-06-24 LAB — SURGICAL PCR SCREEN
MRSA, PCR: POSITIVE — AB
Staphylococcus aureus: POSITIVE — AB

## 2022-06-24 NOTE — Telephone Encounter (Signed)
Preop visit with me on October 9.  Planned for some lab work and chest xray but those have not been performed.  I did receive her echocardiogram report.  It appears that she did have some preop testing yesterday and I do see slight elevated blood sugar but otherwise normal electrolytes and blood counts.  Does her surgeon need an A1c? If so we can have her collect blood work at [facility] as well as the chest xray.   I will have the rest of her paperwork ready in my folder at back nurse station.  Thanks.

## 2022-06-24 NOTE — Progress Notes (Signed)
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 Appropriate notes have been placed on the patient's chart. This note has been routed to Dr. Alvan Dame  for review. The Patient's surgery is currently scheduled for:   Tuesday July 05, 2022             at [facility].  Leota Jacobsen, BSN, CVRN-BC   Pre-Surgical Testing Nurse [place]  [phone]

## 2022-06-27 NOTE — Telephone Encounter (Addendum)
Will need to call surgeons office and find out if they need A1c and if so needs to have that drawn and the Xray done however if not A1c required then just needs to have the Xray done at [facility]   Then fax back form to surgeons once that is complete

## 2022-06-28 NOTE — Telephone Encounter (Signed)
Chest x-ray was ordered as part of my preoperative evaluation.  If she would prefer not to take that x-ray, I can notate that on paperwork and can complete paperwork once I receive it.  Thanks.

## 2022-06-28 NOTE — Telephone Encounter (Signed)
Spoke with Marcille Blanco, she spoke to patient - declines Xray at this time. Preop paperwork ready for fax.

## 2022-06-28 NOTE — Telephone Encounter (Signed)
Called [facility] Dr Candie Echevaria office to request information about surgical clearance. Noted they will send a new form to our office with all required information indicated on that page.

## 2022-06-28 NOTE — Telephone Encounter (Signed)
Per Dr Mancel Bale note you should have this form please call the surgeons office as the note states and find out if an A1c is required

## 2022-06-28 NOTE — Telephone Encounter (Signed)
Called Becky Lawson about completing the XR you requested, the patient has previously spoken with Surgeons office and they had told her she did not need to do the Chest Xray please advise.

## 2022-06-29 NOTE — Telephone Encounter (Signed)
Placed in front bin

## 2022-06-29 NOTE — Telephone Encounter (Signed)
Did also receive a second copy via fax yesterday afternoon

## 2022-06-29 NOTE — Telephone Encounter (Signed)
Faxed back presurgical clearance

## 2022-07-04 NOTE — H&P (Signed)
TOTAL HIP ADMISSION H&P  Patient is admitted for right total hip arthroplasty.  Subjective:  Chief Complaint: right hip pain  HPI: Becky Lawson, 71 y.o. female, has a history of pain and functional disability in the right hip(s) due to arthritis and patient has failed non-surgical conservative treatments for greater than 12 weeks to include NSAID's and/or analgesics and activity modification.  Onset of symptoms was gradual starting 2 years ago with gradually worsening course since that time.The patient noted no past surgery on the right hip(s).  Patient currently rates pain in the right hip at 8 out of 10 with activity. Patient has worsening of pain with activity and weight bearing, pain that interfers with activities of daily living, and pain with passive range of motion. Patient has evidence of joint space narrowing by imaging studies. This condition presents safety issues increasing the risk of falls. There is no current active infection.  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   Chronic throat clearing 06/24/2016   S/P partial thyroidectomy 06/16/2016   Multinodular goiter 06/02/2016   Thyroid nodule 02/08/2016   Hx of hepatitis C 05/19/2015   Vitamin D deficiency 05/19/2015   Spinal stenosis of lumbar region 05/19/2015   Hyperlipidemia 05/19/2015   Bipolar disorder ([place]) 05/19/2015   Essential hypertension  09/26/2013   Undiagnosed cardiac murmurs 09/26/2013   Past Medical History:  Diagnosis Date   Arthritis    Benign cyst of right kidney 2014   2.7 cm right kidney cyst seen on imaging 2014 but was c/o right flank pain with new persistent proteinuria (fortunately hematuria had resolved) so repeat US 11/2016 showed right kidney still with 2.6 cm simple cyst and otherwise nml.   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, genotype 1b, followed at [facility]   Hyperlipidemia    Hypertension    Hypothyroidism    Jaundice 11/01/2012   Pruritic disorder 02/13/2013   Sleep apnea    Was diagnosed approximately 20 years ago, but does not wear CPAP patient stated "I gave it back.Marland KitchenMarland KitchenI couldn't wear that thing it was horrible"   Spinal stenosis    Thyroid disease    Phreesia 06/15/2020    Past Surgical History:  Procedure Laterality Date   ABDOMINAL HYSTERECTOMY     COLONOSCOPY W/ POLYPECTOMY     JOINT REPLACEMENT Bilateral    knee   KNEE ARTHROSCOPY Bilateral    PARTIAL HYSTERECTOMY     THYROID LOBECTOMY Left 06/16/2016   THYROID LOBECTOMY Left 06/16/2016   Procedure: LEFT THYROID LOBECTOMY;  Surgeon: Greer Pickerel, MD;  Location: [facility];  Service: General;  Laterality: Left;   TOTAL HIP ARTHROPLASTY Left 08/03/2021  Procedure: TOTAL HIP ARTHROPLASTY ANTERIOR APPROACH;  Surgeon: Paralee Cancel, MD;  Location: [facility];  Service: Orthopedics;  Laterality: Left;    Current Facility-Administered Medications  Medication Dose Route Frequency Provider Last Rate Last Admin   cyanocobalamin ((VITAMIN B-12)) injection 1,000 mcg  1,000 mcg Intramuscular Q30 days Shawnee Knapp, MD   1,000 mcg at 07/12/18 1652   Current Outpatient Medications  Medication Sig Dispense Refill Last Dose    ALPRAZolam (XANAX) 1 MG tablet TAKE 1/2 TO 1 TABLET BY MOUTH twice daily AS NEEDED FOR ANXIETY (Patient taking differently: Take 1 mg by mouth at bedtime.) 45 tablet 3    amLODipine (NORVASC) 10 MG tablet Take 1 tablet (10 mg total) by mouth daily. 90 tablet 0    atorvastatin (LIPITOR) 40 MG tablet Take 1 tablet (40 mg total) by mouth daily. (Patient taking differently: Take 40 mg by mouth at bedtime.) 90 tablet 1    azelastine (ASTELIN) 0.1 % nasal spray Place 1-2 sprays into both nostrils 2 (two) times daily. Use in each nostril as directed (Patient taking differently: Place 1-2 sprays into both nostrils 2 (two) times daily as needed for rhinitis or allergies. Use in each nostril as directed) 30 mL 3    dextroamphetamine (DEXTROSTAT) 10 MG tablet TAKE ONE TABLET BY MOUTH every morning 30 tablet 0    docusate sodium (COLACE) 100 MG capsule Take 1 capsule (100 mg total) by mouth 2 (two) times daily. 10 capsule 0    HYDROmorphone (DILAUDID) 4 MG tablet Take 4 mg by mouth 3 (three) times daily as needed for severe pain.      hydrOXYzine (ATARAX) 25 MG tablet Take 1 tablet (25 mg total) by mouth every 8 (eight) hours as needed. (Patient taking differently: Take 25 mg by mouth every 8 (eight) hours as needed for anxiety.) 60 tablet 5    lamoTRIgine (LAMICTAL) 100 MG tablet TAKE TWO TABLETS BY MOUTH EVERY MORNING and TAKE ONE TABLET BY MOUTH EVERY EVENING (Patient taking differently: Take 100-200 mg by mouth See admin instructions.) 270 tablet 0    levothyroxine (SYNTHROID) 100 MCG tablet TAKE ONE TABLET BY MOUTH daily BEFORE breakfast 90 tablet 1    omeprazole (PRILOSEC) 40 MG capsule Take 1 capsule (40 mg total) by mouth 2 (two) times daily. (Patient taking differently: Take 40 mg by mouth at bedtime.) 180 capsule 1    polyethylene glycol (MIRALAX / GLYCOLAX) 17 g packet Take 17 g by mouth daily as needed for mild constipation. 14 each 0    QUEtiapine (SEROQUEL) 50 MG tablet TAKE ONE TABLET BY MOUTH ONCE  DAILY (Patient taking differently: Take 50 mg by mouth at bedtime.) 90 tablet 0    traZODone (DESYREL) 150 MG tablet Take 2 tablets (300 mg total) by mouth at bedtime. 180 tablet 1    Vilazodone HCl 20 MG TABS Take 1 tablet (20 mg total) by mouth daily. 30 tablet 2    Vitamin D, Ergocalciferol, (DRISDOL) 1.25 MG (50000 UNIT) CAPS capsule Take 1 capsule (50,000 Units total) by mouth every 7 (seven) days. Sunday 12 capsule 3    mirtazapine (REMERON) 7.5 MG tablet TAKE 1-2 TABLETS (15 MG TOTAL) AT BEDTIME AS NEEDED FOR SLEEP. 180 tablet 0    sertraline (ZOLOFT) 50 MG tablet TAKE ONE TABLET BY MOUTH EVERY MORNING (Patient not taking: Reported on 06/20/2022) 90 tablet 0 Not Taking   No Known Allergies  Social History   Tobacco Use   Smoking status: Former  Packs/day: 1.00    Years: 15.00    Total pack years: 15.00    Types: Cigarettes    Quit date: 06/30/1979    Years since quitting: 43.0   Smokeless tobacco: Never  Substance Use Topics   Alcohol use: Not Currently    Alcohol/week: 0.0 - 1.0 standard drinks of alcohol    Family History  Problem Relation Age of Onset   Diabetes Mother    Heart disease Mother    Hyperlipidemia Mother    Hypertension Mother    Stroke Mother    Hypertension Father    Parkinson's disease Father    Heart disease Father    Hyperlipidemia Father    Hemachromatosis Daughter    Thyroid disease Neg Hx    Colon cancer Neg Hx    Esophageal cancer Neg Hx    Inflammatory bowel disease Neg Hx    Liver disease Neg Hx    Pancreatic cancer Neg Hx    Rectal cancer Neg Hx    Stomach cancer Neg Hx      Review of Systems  Constitutional:  Negative for chills and fever.  Respiratory:  Negative for cough and shortness of breath.   Cardiovascular:  Negative for chest pain.  Gastrointestinal:  Negative for nausea and vomiting.  Musculoskeletal:  Positive for arthralgias.     Objective:  Physical Exam Well nourished and well developed. General: Alert and  oriented x3, cooperative and pleasant, no acute distress. Head: normocephalic, atraumatic, neck supple. Eyes: EOMI.  Musculoskeletal: Right hip exam: Painful and limited hip flexion internal rotation over 5 degrees with pelvic tilting, external rotation over 20 degrees Active hip flexion without significant external rotation contracture She is neurovascular intact distally Left hip otherwise moves fluidly without reproducible pain  Calves soft and nontender. Motor function intact in LE. Strength 5/5 LE bilaterally. Neuro: Distal pulses 2+. Sensation to light touch intact in LE.  Vital signs in last 24 hours:    Labs:   Estimated body mass index is 32.11 kg/m as calculated from the following:   Height as of 06/23/22: 5' 4.5" (1.638 m).   Weight as of 06/23/22: 86.2 kg.   Imaging Review Plain radiographs demonstrate severe degenerative joint disease of the right hip(s). The bone quality appears to be adequate for age and reported activity level.      Assessment/Plan:  End stage arthritis, right hip(s)  The patient history, physical examination, clinical judgement of the provider and imaging studies are consistent with end stage degenerative joint disease of the right hip(s) and total hip arthroplasty is deemed medically necessary. The treatment options including medical management, injection therapy, arthroscopy and arthroplasty were discussed at length. The risks and benefits of total hip arthroplasty were presented and reviewed. The risks due to aseptic loosening, infection, stiffness, dislocation/subluxation,  thromboembolic complications and other imponderables were discussed.  The patient acknowledged the explanation, agreed to proceed with the plan and consent was signed. Patient is being admitted for inpatient treatment for surgery, pain control, PT, OT, prophylactic antibiotics, VTE prophylaxis, progressive ambulation and ADL's and discharge planning.The patient is planning  to be discharged  home.  Therapy Plans: HEP Disposition: Home with daughter Planned DVT Prophylaxis: aspirin '81mg'$  BID DME needed: none PCP: Dr. Corliss Parish, awaiting echo results TXA: IV Allergies: NDKA Anesthesia Concerns: none BMI: 32.6 Last HgbA1c: Not diabetic   Other: - Getting Echo for new heart murmur (10/17) - Left THA last year - Chronic pain management (Oxycodone 10 q4h) -- Post-op (Dilaudid) -  [place] managed last time - Dilaudid, toradol, celebrex, tylenol, robaxin (admits difficulty with pain tolerance)  Costella Hatcher, PA-C Orthopedic Surgery [facility] [place] [phone]

## 2022-07-04 NOTE — Anesthesia Preprocedure Evaluation (Signed)
Anesthesia Evaluation  Patient identified by MRN, date of birth, ID band Patient awake    Reviewed: Allergy & Precautions, NPO status , Patient's Chart, lab work & pertinent test results  Airway Mallampati: II  TM Distance: >3 FB Neck ROM: Full    Dental no notable dental hx. (+) Teeth Intact, Dental Advisory Given   Pulmonary sleep apnea , former smoker,    Pulmonary exam normal breath sounds clear to auscultation       Cardiovascular hypertension, Normal cardiovascular exam Rhythm:Regular Rate:Normal  06/21/2022 TTE 1. Left ventricular ejection fraction, by estimation, is 60 to 65%. The  left ventricle has normal function. The left ventricle has no regional  wall motion abnormalities. Left ventricular diastolic parameters are  consistent with Grade I diastolic  dysfunction (impaired relaxation).     Neuro/Psych PSYCHIATRIC DISORDERS Depression Bipolar Disorder    GI/Hepatic GERD  ,  Endo/Other  Hypothyroidism   Renal/GU Lab Results      Component                Value               Date                      CREATININE               0.75                06/23/2022                  K                        4.2                 06/23/2022                     Musculoskeletal  (+) Arthritis ,   Abdominal   Peds  Hematology Lab Results      Component                Value               Date                  HGB                      13.5                06/23/2022                HCT                      41.1                06/23/2022                PLT                      266                 06/23/2022              Anesthesia Other Findings   Reproductive/Obstetrics                            Anesthesia Physical Anesthesia Plan  ASA:  2  Anesthesia Plan: Spinal   Post-op Pain Management: Regional block*   Induction:   PONV Risk Score and Plan: 3 and Treatment may vary due to age or  medical condition and Ondansetron  Airway Management Planned: Natural Airway and Nasal Cannula  Additional Equipment:   Intra-op Plan:   Post-operative Plan:   Informed Consent: I have reviewed the patients History and Physical, chart, labs and discussed the procedure including the risks, benefits and alternatives for the proposed anesthesia with the patient or authorized representative who has indicated his/her understanding and acceptance.     Dental advisory given  Plan Discussed with: CRNA and Surgeon  Anesthesia Plan Comments: (SP)       Anesthesia Quick Evaluation

## 2022-07-05 ENCOUNTER — Observation Stay (HOSPITAL_COMMUNITY): Payer: PPO

## 2022-07-05 ENCOUNTER — Encounter (HOSPITAL_COMMUNITY)
Admission: RE | Disposition: A | Discharge status: Home / Self Care | Payer: Self-pay | Source: Home / Self Care | Attending: Orthopedic Surgery

## 2022-07-05 ENCOUNTER — Encounter (HOSPITAL_COMMUNITY): Payer: Self-pay | Admitting: Orthopedic Surgery

## 2022-07-05 ENCOUNTER — Observation Stay (HOSPITAL_COMMUNITY)
Admission: RE | Admit: 2022-07-05 | Discharge: 2022-07-07 | Disposition: A | Discharge status: Home / Self Care | Payer: PPO | Attending: Orthopedic Surgery | Admitting: Orthopedic Surgery

## 2022-07-05 ENCOUNTER — Ambulatory Visit (HOSPITAL_COMMUNITY): Payer: PPO

## 2022-07-05 ENCOUNTER — Other Ambulatory Visit: Payer: Self-pay

## 2022-07-05 ENCOUNTER — Ambulatory Visit (HOSPITAL_COMMUNITY): Payer: PPO | Admitting: Anesthesiology

## 2022-07-05 ENCOUNTER — Ambulatory Visit (HOSPITAL_BASED_OUTPATIENT_CLINIC_OR_DEPARTMENT_OTHER): Payer: PPO | Admitting: Anesthesiology

## 2022-07-05 DIAGNOSIS — Z87891 Personal history of nicotine dependence: Secondary | ICD-10-CM | POA: Diagnosis not present

## 2022-07-05 DIAGNOSIS — N189 Chronic kidney disease, unspecified: Secondary | ICD-10-CM | POA: Insufficient documentation

## 2022-07-05 DIAGNOSIS — Z96653 Presence of artificial knee joint, bilateral: Secondary | ICD-10-CM | POA: Diagnosis not present

## 2022-07-05 DIAGNOSIS — R600 Localized edema: Secondary | ICD-10-CM | POA: Diagnosis not present

## 2022-07-05 DIAGNOSIS — M1611 Unilateral primary osteoarthritis, right hip: Secondary | ICD-10-CM

## 2022-07-05 DIAGNOSIS — Z96641 Presence of right artificial hip joint: Secondary | ICD-10-CM | POA: Diagnosis not present

## 2022-07-05 DIAGNOSIS — Z79899 Other long term (current) drug therapy: Secondary | ICD-10-CM | POA: Insufficient documentation

## 2022-07-05 DIAGNOSIS — Z96642 Presence of left artificial hip joint: Secondary | ICD-10-CM | POA: Insufficient documentation

## 2022-07-05 DIAGNOSIS — I129 Hypertensive chronic kidney disease with stage 1 through stage 4 chronic kidney disease, or unspecified chronic kidney disease: Secondary | ICD-10-CM | POA: Insufficient documentation

## 2022-07-05 DIAGNOSIS — Z859 Personal history of malignant neoplasm, unspecified: Secondary | ICD-10-CM | POA: Diagnosis not present

## 2022-07-05 DIAGNOSIS — E039 Hypothyroidism, unspecified: Secondary | ICD-10-CM | POA: Diagnosis not present

## 2022-07-05 DIAGNOSIS — Z471 Aftercare following joint replacement surgery: Secondary | ICD-10-CM | POA: Diagnosis not present

## 2022-07-05 HISTORY — PX: TOTAL HIP ARTHROPLASTY: SHX124

## 2022-07-05 SURGERY — ARTHROPLASTY, HIP, TOTAL, ANTERIOR APPROACH
Anesthesia: Spinal | Site: Hip | Laterality: Right

## 2022-07-05 MED ORDER — ALBUMIN HUMAN 5 % IV SOLN: INTRAVENOUS | Status: DC | PRN

## 2022-07-05 MED ORDER — LIDOCAINE HCL (PF) 2 % IJ SOLN
INTRAMUSCULAR | Status: AC
  Filled 2022-07-05: qty 5

## 2022-07-05 MED ORDER — HYDROMORPHONE HCL 1 MG/ML IJ SOLN
0.5000 mg | INTRAMUSCULAR | Status: DC | PRN
  Administered 2022-07-05 – 2022-07-06 (×5): 1 mg via INTRAVENOUS
  Filled 2022-07-05 (×5): qty 1

## 2022-07-05 MED ORDER — PHENOL 1.4 % MT LIQD: 1.0000 | OROMUCOSAL | Status: DC | PRN

## 2022-07-05 MED ORDER — FENTANYL CITRATE (PF) 100 MCG/2ML IJ SOLN
INTRAMUSCULAR | Status: DC | PRN
  Administered 2022-07-05: 100 ug via INTRAVENOUS

## 2022-07-05 MED ORDER — FENTANYL CITRATE PF 50 MCG/ML IJ SOSY
25.0000 ug | PREFILLED_SYRINGE | INTRAMUSCULAR | Status: DC | PRN
  Administered 2022-07-05 (×3): 50 ug via INTRAVENOUS

## 2022-07-05 MED ORDER — ORAL CARE MOUTH RINSE: 15.0000 mL | Freq: Once | OROMUCOSAL | Status: AC

## 2022-07-05 MED ORDER — TRANEXAMIC ACID-NACL 1000-0.7 MG/100ML-% IV SOLN
1000.0000 mg | Freq: Once | INTRAVENOUS | Status: AC
  Administered 2022-07-05: 1000 mg via INTRAVENOUS
  Filled 2022-07-05: qty 100

## 2022-07-05 MED ORDER — SENNA 8.6 MG PO TABS
2.0000 | ORAL_TABLET | Freq: Every day | ORAL | Status: DC
  Filled 2022-07-05 (×2): qty 2

## 2022-07-05 MED ORDER — MIRTAZAPINE 15 MG PO TABS
15.0000 mg | ORAL_TABLET | Freq: Every day | ORAL | Status: DC
  Administered 2022-07-05 – 2022-07-06 (×2): 15 mg via ORAL
  Filled 2022-07-05 (×2): qty 1

## 2022-07-05 MED ORDER — LAMOTRIGINE 100 MG PO TABS
100.0000 mg | ORAL_TABLET | Freq: Every day | ORAL | Status: DC
  Administered 2022-07-05 – 2022-07-06 (×2): 100 mg via ORAL
  Filled 2022-07-05 (×2): qty 1

## 2022-07-05 MED ORDER — DEXAMETHASONE SODIUM PHOSPHATE 10 MG/ML IJ SOLN
INTRAMUSCULAR | Status: DC | PRN
  Administered 2022-07-05: 10 mg via INTRAVENOUS

## 2022-07-05 MED ORDER — ONDANSETRON HCL 4 MG/2ML IJ SOLN: 4.0000 mg | Freq: Four times a day (QID) | INTRAMUSCULAR | Status: DC | PRN

## 2022-07-05 MED ORDER — TRAZODONE HCL 100 MG PO TABS
300.0000 mg | ORAL_TABLET | Freq: Every day | ORAL | Status: DC
  Administered 2022-07-05 – 2022-07-06 (×2): 300 mg via ORAL
  Filled 2022-07-05 (×2): qty 3

## 2022-07-05 MED ORDER — HYDROMORPHONE HCL 2 MG PO TABS
ORAL_TABLET | ORAL | Status: AC
  Filled 2022-07-05: qty 2

## 2022-07-05 MED ORDER — ALBUMIN HUMAN 5 % IV SOLN
INTRAVENOUS | Status: AC
  Filled 2022-07-05: qty 250

## 2022-07-05 MED ORDER — FENTANYL CITRATE PF 50 MCG/ML IJ SOSY
PREFILLED_SYRINGE | INTRAMUSCULAR | Status: AC
  Filled 2022-07-05: qty 2

## 2022-07-05 MED ORDER — LEVOTHYROXINE SODIUM 100 MCG PO TABS
100.0000 ug | ORAL_TABLET | Freq: Every day | ORAL | Status: DC
  Administered 2022-07-06 – 2022-07-07 (×2): 100 ug via ORAL
  Filled 2022-07-05 (×2): qty 1

## 2022-07-05 MED ORDER — MENTHOL 3 MG MT LOZG: 1.0000 | LOZENGE | OROMUCOSAL | Status: DC | PRN

## 2022-07-05 MED ORDER — ACETAMINOPHEN 325 MG PO TABS: 325.0000 mg | ORAL_TABLET | Freq: Four times a day (QID) | ORAL | Status: DC | PRN

## 2022-07-05 MED ORDER — VANCOMYCIN HCL IN DEXTROSE 1-5 GM/200ML-% IV SOLN
1000.0000 mg | INTRAVENOUS | Status: AC
  Administered 2022-07-05: 1000 mg via INTRAVENOUS
  Filled 2022-07-05: qty 200

## 2022-07-05 MED ORDER — PROPOFOL 500 MG/50ML IV EMUL
INTRAVENOUS | Status: DC | PRN
  Administered 2022-07-05: 5075 ug/kg/min via INTRAVENOUS

## 2022-07-05 MED ORDER — POLYETHYLENE GLYCOL 3350 17 G PO PACK
17.0000 g | PACK | Freq: Two times a day (BID) | ORAL | Status: DC
  Filled 2022-07-05 (×4): qty 1

## 2022-07-05 MED ORDER — PROPOFOL 10 MG/ML IV BOLUS
INTRAVENOUS | Status: AC
  Filled 2022-07-05: qty 20

## 2022-07-05 MED ORDER — AMLODIPINE BESYLATE 10 MG PO TABS
10.0000 mg | ORAL_TABLET | Freq: Every day | ORAL | Status: DC
  Administered 2022-07-06 – 2022-07-07 (×2): 10 mg via ORAL
  Filled 2022-07-05 (×2): qty 1

## 2022-07-05 MED ORDER — ATORVASTATIN CALCIUM 40 MG PO TABS
40.0000 mg | ORAL_TABLET | Freq: Every day | ORAL | Status: DC
  Administered 2022-07-05 – 2022-07-06 (×2): 40 mg via ORAL
  Filled 2022-07-05 (×2): qty 1

## 2022-07-05 MED ORDER — DEXTROAMPHETAMINE SULFATE 5 MG PO TABS: 10.0000 mg | ORAL_TABLET | Freq: Every morning | ORAL | Status: DC

## 2022-07-05 MED ORDER — BUPIVACAINE HCL (PF) 0.5 % IJ SOLN
INTRAMUSCULAR | Status: AC
  Filled 2022-07-05: qty 30

## 2022-07-05 MED ORDER — TRANEXAMIC ACID-NACL 1000-0.7 MG/100ML-% IV SOLN
1000.0000 mg | INTRAVENOUS | Status: AC
  Administered 2022-07-05: 1000 mg via INTRAVENOUS
  Filled 2022-07-05: qty 100

## 2022-07-05 MED ORDER — VILAZODONE HCL 20 MG PO TABS
20.0000 mg | ORAL_TABLET | Freq: Every day | ORAL | Status: DC
  Administered 2022-07-06: 20 mg via ORAL
  Filled 2022-07-05 (×2): qty 1

## 2022-07-05 MED ORDER — CHLORHEXIDINE GLUCONATE 0.12 % MT SOLN
15.0000 mL | Freq: Once | OROMUCOSAL | Status: AC
  Administered 2022-07-05: 15 mL via OROMUCOSAL

## 2022-07-05 MED ORDER — ACETAMINOPHEN 500 MG PO TABS
1000.0000 mg | ORAL_TABLET | Freq: Four times a day (QID) | ORAL | Status: DC
  Administered 2022-07-06 – 2022-07-07 (×7): 1000 mg via ORAL
  Filled 2022-07-05 (×8): qty 2

## 2022-07-05 MED ORDER — ONDANSETRON HCL 4 MG PO TABS: 4.0000 mg | ORAL_TABLET | Freq: Four times a day (QID) | ORAL | Status: DC | PRN

## 2022-07-05 MED ORDER — PANTOPRAZOLE SODIUM 40 MG PO TBEC
40.0000 mg | DELAYED_RELEASE_TABLET | Freq: Every day | ORAL | Status: DC
  Administered 2022-07-06 – 2022-07-07 (×2): 40 mg via ORAL
  Filled 2022-07-05 (×2): qty 1

## 2022-07-05 MED ORDER — FENTANYL CITRATE PF 50 MCG/ML IJ SOSY
50.0000 ug | PREFILLED_SYRINGE | INTRAMUSCULAR | Status: DC | PRN
  Administered 2022-07-05: 50 ug via INTRAVENOUS

## 2022-07-05 MED ORDER — ASPIRIN 81 MG PO CHEW
81.0000 mg | CHEWABLE_TABLET | Freq: Two times a day (BID) | ORAL | Status: DC
  Administered 2022-07-05 – 2022-07-07 (×4): 81 mg via ORAL
  Filled 2022-07-05 (×5): qty 1

## 2022-07-05 MED ORDER — MIDAZOLAM HCL 2 MG/2ML IJ SOLN
INTRAMUSCULAR | Status: AC
  Filled 2022-07-05: qty 2

## 2022-07-05 MED ORDER — METHOCARBAMOL 500 MG PO TABS
500.0000 mg | ORAL_TABLET | Freq: Four times a day (QID) | ORAL | Status: DC | PRN
  Administered 2022-07-05 – 2022-07-07 (×4): 500 mg via ORAL
  Filled 2022-07-05 (×4): qty 1

## 2022-07-05 MED ORDER — BISACODYL 10 MG RE SUPP: 10.0000 mg | Freq: Every day | RECTAL | Status: DC | PRN

## 2022-07-05 MED ORDER — ACETAMINOPHEN 10 MG/ML IV SOLN
1000.0000 mg | Freq: Once | INTRAVENOUS | Status: DC | PRN
  Administered 2022-07-05: 1000 mg via INTRAVENOUS

## 2022-07-05 MED ORDER — ACETAMINOPHEN 10 MG/ML IV SOLN
INTRAVENOUS | Status: AC
  Filled 2022-07-05: qty 100

## 2022-07-05 MED ORDER — METOCLOPRAMIDE HCL 5 MG/ML IJ SOLN: 5.0000 mg | Freq: Three times a day (TID) | INTRAMUSCULAR | Status: DC | PRN

## 2022-07-05 MED ORDER — DOCUSATE SODIUM 100 MG PO CAPS
100.0000 mg | ORAL_CAPSULE | Freq: Two times a day (BID) | ORAL | Status: DC
  Administered 2022-07-07: 100 mg via ORAL
  Filled 2022-07-05 (×3): qty 1

## 2022-07-05 MED ORDER — ALPRAZOLAM 0.5 MG PO TABS
1.0000 mg | ORAL_TABLET | Freq: Every day | ORAL | Status: DC
  Administered 2022-07-05 – 2022-07-06 (×2): 1 mg via ORAL
  Filled 2022-07-05 (×2): qty 2

## 2022-07-05 MED ORDER — DEXAMETHASONE SODIUM PHOSPHATE 10 MG/ML IJ SOLN: 8.0000 mg | Freq: Once | INTRAMUSCULAR | Status: DC

## 2022-07-05 MED ORDER — HYDROMORPHONE HCL 2 MG PO TABS: 4.0000 mg | ORAL_TABLET | ORAL | Status: DC | PRN

## 2022-07-05 MED ORDER — FENTANYL CITRATE PF 50 MCG/ML IJ SOSY
PREFILLED_SYRINGE | INTRAMUSCULAR | Status: AC
  Filled 2022-07-05: qty 1

## 2022-07-05 MED ORDER — LACTATED RINGERS IV SOLN: INTRAVENOUS | Status: DC

## 2022-07-05 MED ORDER — ONDANSETRON HCL 4 MG/2ML IJ SOLN: 4.0000 mg | Freq: Once | INTRAMUSCULAR | Status: DC | PRN

## 2022-07-05 MED ORDER — HYDROMORPHONE HCL 2 MG PO TABS
4.0000 mg | ORAL_TABLET | ORAL | Status: DC | PRN
  Administered 2022-07-05: 6 mg via ORAL
  Administered 2022-07-05: 4 mg via ORAL
  Administered 2022-07-06 (×2): 6 mg via ORAL
  Filled 2022-07-05 (×3): qty 3

## 2022-07-05 MED ORDER — FENTANYL CITRATE (PF) 100 MCG/2ML IJ SOLN
INTRAMUSCULAR | Status: AC
  Filled 2022-07-05: qty 2

## 2022-07-05 MED ORDER — FERROUS SULFATE 325 (65 FE) MG PO TABS
325.0000 mg | ORAL_TABLET | Freq: Three times a day (TID) | ORAL | Status: DC
  Administered 2022-07-05 – 2022-07-07 (×6): 325 mg via ORAL
  Filled 2022-07-05 (×6): qty 1

## 2022-07-05 MED ORDER — DIPHENHYDRAMINE HCL 12.5 MG/5ML PO ELIX: 12.5000 mg | ORAL_SOLUTION | ORAL | Status: DC | PRN

## 2022-07-05 MED ORDER — PROPOFOL 10 MG/ML IV BOLUS
INTRAVENOUS | Status: DC | PRN
  Administered 2022-07-05: 20 mg via INTRAVENOUS
  Administered 2022-07-05 (×2): 10 mg via INTRAVENOUS
  Administered 2022-07-05 (×2): 20 mg via INTRAVENOUS

## 2022-07-05 MED ORDER — SODIUM CHLORIDE 0.9 % IV SOLN: INTRAVENOUS | Status: DC

## 2022-07-05 MED ORDER — HYDROXYZINE HCL 25 MG PO TABS: 25.0000 mg | ORAL_TABLET | Freq: Three times a day (TID) | ORAL | Status: DC | PRN

## 2022-07-05 MED ORDER — PHENYLEPHRINE HCL-NACL 20-0.9 MG/250ML-% IV SOLN
INTRAVENOUS | Status: DC | PRN
  Administered 2022-07-05: 50 ug/min via INTRAVENOUS

## 2022-07-05 MED ORDER — DEXAMETHASONE SODIUM PHOSPHATE 10 MG/ML IJ SOLN
INTRAMUSCULAR | Status: AC
  Filled 2022-07-05: qty 1

## 2022-07-05 MED ORDER — CEFAZOLIN SODIUM-DEXTROSE 2-4 GM/100ML-% IV SOLN
2.0000 g | Freq: Four times a day (QID) | INTRAVENOUS | Status: AC
  Administered 2022-07-05 – 2022-07-06 (×2): 2 g via INTRAVENOUS
  Filled 2022-07-05 (×2): qty 100

## 2022-07-05 MED ORDER — QUETIAPINE FUMARATE 50 MG PO TABS
50.0000 mg | ORAL_TABLET | Freq: Every day | ORAL | Status: DC
  Administered 2022-07-05 – 2022-07-06 (×2): 50 mg via ORAL
  Filled 2022-07-05 (×2): qty 1

## 2022-07-05 MED ORDER — BUPIVACAINE HCL (PF) 0.5 % IJ SOLN
INTRAMUSCULAR | Status: DC | PRN
  Administered 2022-07-05: 11 mg via INTRATHECAL

## 2022-07-05 MED ORDER — DOCUSATE SODIUM 100 MG PO CAPS
100.0000 mg | ORAL_CAPSULE | Freq: Two times a day (BID) | ORAL | Status: DC
  Filled 2022-07-05 (×4): qty 1

## 2022-07-05 MED ORDER — METHOCARBAMOL 1000 MG/10ML IJ SOLN: 500.0000 mg | Freq: Four times a day (QID) | INTRAVENOUS | Status: DC | PRN

## 2022-07-05 MED ORDER — LAMOTRIGINE 100 MG PO TABS
200.0000 mg | ORAL_TABLET | Freq: Every day | ORAL | Status: DC
  Administered 2022-07-06 – 2022-07-07 (×2): 200 mg via ORAL
  Filled 2022-07-05 (×2): qty 2

## 2022-07-05 MED ORDER — ONDANSETRON HCL 4 MG/2ML IJ SOLN
INTRAMUSCULAR | Status: AC
  Filled 2022-07-05: qty 2

## 2022-07-05 MED ORDER — SODIUM CHLORIDE 0.9 % IR SOLN
Status: DC | PRN
  Administered 2022-07-05: 1000 mL

## 2022-07-05 MED ORDER — DEXAMETHASONE SODIUM PHOSPHATE 10 MG/ML IJ SOLN
10.0000 mg | Freq: Once | INTRAMUSCULAR | Status: AC
  Administered 2022-07-06: 10 mg via INTRAVENOUS
  Filled 2022-07-05: qty 1

## 2022-07-05 MED ORDER — METOCLOPRAMIDE HCL 5 MG PO TABS: 5.0000 mg | ORAL_TABLET | Freq: Three times a day (TID) | ORAL | Status: DC | PRN

## 2022-07-05 MED ORDER — POVIDONE-IODINE 10 % EX SWAB
2.0000 | Freq: Once | CUTANEOUS | Status: AC
  Administered 2022-07-05: 2 via TOPICAL

## 2022-07-05 MED ORDER — CEFAZOLIN SODIUM-DEXTROSE 2-4 GM/100ML-% IV SOLN
2.0000 g | INTRAVENOUS | Status: AC
  Administered 2022-07-05: 2 g via INTRAVENOUS
  Filled 2022-07-05: qty 100

## 2022-07-05 MED ORDER — MIDAZOLAM HCL 5 MG/5ML IJ SOLN
INTRAMUSCULAR | Status: DC | PRN
  Administered 2022-07-05: 2 mg via INTRAVENOUS

## 2022-07-05 SURGICAL SUPPLY — 41 items
ADH SKN CLS APL DERMABOND .7 (GAUZE/BANDAGES/DRESSINGS) ×1
BAG COUNTER SPONGE SURGICOUNT (BAG) IMPLANT
BAG DECANTER FOR FLEXI CONT (MISCELLANEOUS) IMPLANT
BAG SPEC THK2 15X12 ZIP CLS (MISCELLANEOUS)
BAG SPNG CNTER NS LX DISP (BAG) ×1
BAG ZIPLOCK 12X15 (MISCELLANEOUS) IMPLANT
BLADE SAG 18X100X1.27 (BLADE) ×2 IMPLANT
COVER PERINEAL POST (MISCELLANEOUS) ×2 IMPLANT
COVER SURGICAL LIGHT HANDLE (MISCELLANEOUS) ×2 IMPLANT
CUP ACETBLR 54 OD PINNACLE (Hips) IMPLANT
DERMABOND ADVANCED .7 DNX12 (GAUZE/BANDAGES/DRESSINGS) ×2 IMPLANT
DRAPE FOOT SWITCH (DRAPES) ×2 IMPLANT
DRAPE STERI IOBAN 125X83 (DRAPES) ×2 IMPLANT
DRAPE U-SHAPE 47X51 STRL (DRAPES) ×4 IMPLANT
DRESSING AQUACEL AG SP 3.5X10 (GAUZE/BANDAGES/DRESSINGS) ×2 IMPLANT
DRSG AQUACEL AG SP 3.5X10 (GAUZE/BANDAGES/DRESSINGS) ×1
DURAPREP 26ML APPLICATOR (WOUND CARE) ×2 IMPLANT
ELECT REM PT RETURN 15FT ADLT (MISCELLANEOUS) ×2 IMPLANT
GLOVE BIO SURGEON STRL SZ 6 (GLOVE) ×2 IMPLANT
GLOVE BIOGEL PI IND STRL 6.5 (GLOVE) ×2 IMPLANT
GLOVE BIOGEL PI IND STRL 7.5 (GLOVE) ×2 IMPLANT
GLOVE ORTHO TXT STRL SZ7.5 (GLOVE) ×4 IMPLANT
GOWN STRL REUS W/ TWL LRG LVL3 (GOWN DISPOSABLE) ×4 IMPLANT
GOWN STRL REUS W/TWL LRG LVL3 (GOWN DISPOSABLE) ×2
HEAD CERAMIC DELTA 36 PLUS 1.5 (Hips) IMPLANT
HOLDER FOLEY CATH W/STRAP (MISCELLANEOUS) ×2 IMPLANT
KIT TURNOVER KIT A (KITS) IMPLANT
LINER NEUTRAL 54X36MM PLUS 4 (Hips) IMPLANT
PACK ANTERIOR HIP CUSTOM (KITS) ×2 IMPLANT
SCREW 6.5MMX25MM (Screw) IMPLANT
STEM FEMORAL SZ6 HIGH ACTIS (Stem) IMPLANT
SUT MNCRL AB 4-0 PS2 18 (SUTURE) ×2 IMPLANT
SUT STRATAFIX 0 PDS 27 VIOLET (SUTURE) ×1
SUT VIC AB 1 CT1 36 (SUTURE) ×6 IMPLANT
SUT VIC AB 2-0 CT1 27 (SUTURE) ×2
SUT VIC AB 2-0 CT1 TAPERPNT 27 (SUTURE) ×4 IMPLANT
SUTURE STRATFX 0 PDS 27 VIOLET (SUTURE) ×2 IMPLANT
TRAY FOLEY MTR SLVR 14FR STAT (SET/KITS/TRAYS/PACK) IMPLANT
TRAY FOLEY MTR SLVR 16FR STAT (SET/KITS/TRAYS/PACK) IMPLANT
TUBE SUCTION HIGH CAP CLEAR NV (SUCTIONS) ×2 IMPLANT
WATER STERILE IRR 1000ML POUR (IV SOLUTION) ×2 IMPLANT

## 2022-07-05 NOTE — Anesthesia Procedure Notes (Signed)
Date/Time: 07/05/2022 2:05 PM  Performed by: Sharlette Dense, CRNAOxygen Delivery Method: Simple face mask

## 2022-07-05 NOTE — Op Note (Signed)
NAME:  Becky Lawson NO.: [id number]      MEDICAL RECORD NO.: [id number]      FACILITY:  [facility]      PHYSICIAN:  [name]  DATE OF BIRTH:  1951-03-21     DATE OF PROCEDURE:  07/05/2022                                 OPERATIVE REPORT         PREOPERATIVE DIAGNOSIS: Right  hip osteoarthritis.      POSTOPERATIVE DIAGNOSIS:  Right hip osteoarthritis.      PROCEDURE:  Right total hip replacement through an anterior approach   utilizing DePuy THR system, component size 54 mm pinnacle cup, a size 36+4 neutral   Altrex liner, a size 6 Hi Actis stem with a 36+1.5 delta ceramic   ball.      SURGEON:  Pietro Cassis. Alvan Dame, M.D.      ASSISTANT:  Costella Hatcher, PA-C     ANESTHESIA:  Spinal.      SPECIMENS:  None.      COMPLICATIONS:  None.      BLOOD LOSS:  750 cc     DRAINS:  None.      INDICATION OF THE PROCEDURE:  Becky Lawson is a 71 y.o. female who had   presented to office for evaluation of right hip pain.  Radiographs revealed   progressive degenerative changes with bone-on-bone   articulation of the  hip joint, including subchondral cystic changes and osteophytes.  The patient had painful limited range of   motion significantly affecting their overall quality of life and function.  The patient was failing to    respond to conservative measures including medications and/or injections and activity modification and at this point was ready   to proceed with more definitive measures.  Consent was obtained for   benefit of pain relief.  Specific risks of infection, DVT, component   failure, dislocation, neurovascular injury, and need for revision surgery were reviewed in the office     PROCEDURE IN DETAIL:  The patient was brought to operative theater.   Once adequate anesthesia, preoperative antibiotics, 2 gm of Ancef, 1 gm of Tranexamic Acid, and 10 mg of Decadron were administered, the patient was positioned supine on the [employer] table.  Once the patient was safely positioned with adequate padding of boney prominences we predraped out the hip, and used fluoroscopy to confirm orientation of the pelvis.      The right hip was then prepped and draped from proximal iliac crest to   mid thigh with a shower curtain technique.      Time-out was performed identifying the patient, planned procedure, and the appropriate extremity.     An incision was then made 2 cm lateral to the   anterior superior iliac spine extending over the orientation of the   tensor fascia lata muscle and sharp dissection was carried down to the   fascia of the muscle.      The fascia was then incised.  The muscle belly was identified and swept   laterally and retractor placed along the superior neck.  Following   cauterization of the circumflex vessels and removing some pericapsular   fat, a second cobra retractor was placed on the inferior  neck.  A T-capsulotomy was made along the line of the   superior neck to the trochanteric fossa, then extended proximally and   distally.  Tag sutures were placed and the retractors were then placed   intracapsular.  We then identified the trochanteric fossa and   orientation of my neck cut and then made a neck osteotomy with the femur on traction.  The femoral   head was removed without difficulty or complication.  Traction was let   off and retractors were placed posterior and anterior around the   acetabulum.      The labrum and foveal tissue were debrided.  I began reaming with a 50 mm   reamer and reamed up to 53 mm reamer with good bony bed preparation and a 54 mm  cup was chosen.  The final 54 mm Pinnacle cup was then impacted under fluoroscopy to confirm the depth of penetration and orientation with respect to   Abduction and forward flexion.  A screw was placed into the ilium followed by the hole eliminator.  The final   36+4 neutral Altrex liner was impacted with good visualized rim fit.  The cup  was positioned anatomically within the acetabular portion of the pelvis.      At this point, the femur was rolled to 100 degrees.  Further capsule was   released off the inferior aspect of the femoral neck.  I then   released the superior capsule proximally.  With the leg in a neutral position the hook was placed laterally   along the femur under the vastus lateralis origin and elevated manually and then held in position using the hook attachment on the bed.  The leg was then extended and adducted with the leg rolled to 100   degrees of external rotation.  Retractors were placed along the medial calcar and posteriorly over the greater trochanter.  Once the proximal femur was fully   exposed, I used a box osteotome to set orientation.  I then began   broaching with the starting chili pepper broach and passed this by hand and then broached up to 6.  With the 6 broach in place I chose a high offset neck and did several trial reductions.  The offset was appropriate, leg lengths   appeared to be equal best matched with the +1.5 head ball trial confirmed radiographically.   Given these findings, I went ahead and dislocated the hip, repositioned all   retractors and positioned the right hip in the extended and abducted position.  The final 6 Hi Actis stem was   chosen and it was impacted down to the level of neck cut.  Based on this   and the trial reductions, a final 36=1.5 delta ceramic ball was chosen and   impacted onto a clean and dry trunnion, and the hip was reduced.  The   hip had been irrigated throughout the case again at this point.  I did   reapproximate the superior capsular leaflet to the anterior leaflet   using #1 Vicryl.  The fascia of the   tensor fascia lata muscle was then reapproximated using #1 Vicryl and #0 Stratafix sutures.  The   remaining wound was closed with 2-0 Vicryl and running 4-0 Monocryl.   The hip was cleaned, dried, and dressed sterilely using Dermabond and    Aquacel dressing.  The patient was then brought   to recovery room in stable condition tolerating the procedure well.    Caryl Pina  Lu Duffel, PA-C was present for the entirety of the case involved from   preoperative positioning, perioperative retractor management, general   facilitation of the case, as well as primary wound closure as assistant.            Pietro Cassis Alvan Dame, M.D.        07/05/2022 12:49 PM

## 2022-07-05 NOTE — Transfer of Care (Signed)
Immediate Anesthesia Transfer of Care Note  Patient: Becky Lawson  Procedure(s) Performed: TOTAL HIP ARTHROPLASTY ANTERIOR APPROACH (Right: Hip)  Patient Location: PACU  Anesthesia Type:Spinal  Level of Consciousness: awake, alert  and oriented  Airway & Oxygen Therapy: Patient Spontanous Breathing and Patient connected to face mask oxygen  Post-op Assessment: Report given to RN and Post -op Vital signs reviewed and stable  Post vital signs: Reviewed and stable  Last Vitals:  Vitals Value Taken Time  BP 133/77 07/05/22 1545  Temp    Pulse 74 07/05/22 1546  Resp 16 07/05/22 1546  SpO2 100 % 07/05/22 1546  Vitals shown include unvalidated device data.  Last Pain:  Vitals:   07/05/22 1236  TempSrc: Oral  PainSc:       Patients Stated Pain Goal: 6 (08/29/82 4621)  Complications: No notable events documented.

## 2022-07-05 NOTE — Interval H&P Note (Signed)
History and Physical Interval Note:  07/05/2022 12:48 PM  Becky Lawson  has presented today for surgery, with the diagnosis of Right hip osteoarthritis.  The various methods of treatment have been discussed with the patient and family. After consideration of risks, benefits and other options for treatment, the patient has consented to  Procedure(s): TOTAL HIP ARTHROPLASTY ANTERIOR APPROACH (Right) as a surgical intervention.  The patient's history has been reviewed, patient examined, no change in status, stable for surgery.  I have reviewed the patient's chart and labs.  Questions were answered to the patient's satisfaction.     [name]

## 2022-07-05 NOTE — Anesthesia Procedure Notes (Signed)
Spinal  Patient location during procedure: OR Start time: 07/05/2022 1:43 AM End time: 07/05/2022 2:10 PM Reason for block: surgical anesthesia Staffing Performed: anesthesiologist  Anesthesiologist: Barnet Glasgow, MD Performed by: Barnet Glasgow, MD Authorized by: Barnet Glasgow, MD   Preanesthetic Checklist Completed: patient identified, IV checked, risks and benefits discussed, surgical consent, monitors and equipment checked, pre-op evaluation and timeout performed Spinal Block Patient position: sitting Prep: DuraPrep and site prepped and draped Patient monitoring: heart rate, cardiac monitor, continuous pulse ox and blood pressure Approach: midline Location: L3-4 Injection technique: single-shot Needle Needle type: Pencan  Needle gauge: 24 G Needle length: 10 cm Needle insertion depth: 6 cm Assessment Sensory level: T4 Events: CSF return Additional Notes  1 Attempt (s). Pt tolerated procedure well.

## 2022-07-05 NOTE — Anesthesia Postprocedure Evaluation (Signed)
Anesthesia Post Note  Patient: Becky Lawson  Procedure(s) Performed: TOTAL HIP ARTHROPLASTY ANTERIOR APPROACH (Right: Hip)     Patient location during evaluation: Nursing Unit Anesthesia Type: Spinal Level of consciousness: oriented and awake and alert Pain management: pain level controlled Vital Signs Assessment: post-procedure vital signs reviewed and stable Respiratory status: spontaneous breathing and respiratory function stable Cardiovascular status: blood pressure returned to baseline and stable Postop Assessment: no headache, no backache, no apparent nausea or vomiting and patient able to bend at knees Anesthetic complications: no   No notable events documented.  Last Vitals:  Vitals:   07/05/22 1715 07/05/22 1759  BP: (!) 146/74 (!) 177/87  Pulse: 65 81  Resp: 15 18  Temp: (!) 36.3 C 36.5 C  SpO2: 100% 97%    Last Pain:  Vitals:   07/05/22 1824  TempSrc:   PainSc: 8                  Barnet Glasgow

## 2022-07-05 NOTE — Plan of Care (Signed)
  Problem: Education: Goal: Knowledge of General Education information will improve Description: Including pain rating scale, medication(s)/side effects and non-pharmacologic comfort measures Outcome: Progressing   Problem: Health Behavior/Discharge Planning: Goal: Ability to manage health-related needs will improve Outcome: Progressing   Problem: Clinical Measurements: Goal: Ability to maintain clinical measurements within normal limits will improve Outcome: Progressing   Problem: Elimination: Goal: Will not experience complications related to bowel motility Outcome: Progressing   Problem: Pain Managment: Goal: General experience of comfort will improve Outcome: Progressing

## 2022-07-05 NOTE — Care Plan (Signed)
Ortho Bundle Case Management Note  Patient Details  Name: Becky Lawson MRN: [id number] Date of Birth: 23-Apr-1951                  R THA on 10/31. DCP: Home with daughter. DME: No needs. Has RW. PT: HEP   DME Arranged:  N/A DME Agency:     HH Arranged:    [place] Agency:     Additional Comments: Please contact me with any questions of if this plan should need to change.  Marianne Sofia, RN,CCM [facility]  [phone] 07/05/2022, 2:46 PM

## 2022-07-05 NOTE — Discharge Instructions (Signed)

## 2022-07-06 ENCOUNTER — Encounter (HOSPITAL_COMMUNITY): Payer: Self-pay | Admitting: Orthopedic Surgery

## 2022-07-06 DIAGNOSIS — M1611 Unilateral primary osteoarthritis, right hip: Secondary | ICD-10-CM | POA: Diagnosis not present

## 2022-07-06 LAB — CBC
HCT: 28 % — ABNORMAL LOW (ref 36.0–46.0)
Hemoglobin: 9.4 g/dL — ABNORMAL LOW (ref 12.0–15.0)
MCH: 31.3 pg (ref 26.0–34.0)
MCHC: 33.6 g/dL (ref 30.0–36.0)
MCV: 93.3 fL (ref 80.0–100.0)
Platelets: 216 10*3/uL (ref 150–400)
RBC: 3 MIL/uL — ABNORMAL LOW (ref 3.87–5.11)
RDW: 12.2 % (ref 11.5–15.5)
WBC: 13.3 10*3/uL — ABNORMAL HIGH (ref 4.0–10.5)
nRBC: 0 % (ref 0.0–0.2)

## 2022-07-06 LAB — BASIC METABOLIC PANEL
Anion gap: 7 (ref 5–15)
BUN: 13 mg/dL (ref 8–23)
CO2: 24 mmol/L (ref 22–32)
Calcium: 8.4 mg/dL — ABNORMAL LOW (ref 8.9–10.3)
Chloride: 105 mmol/L (ref 98–111)
Creatinine, Ser: 0.72 mg/dL (ref 0.44–1.00)
GFR, Estimated: >60 mL/min (ref >60)
Glucose, Bld: 141 mg/dL — ABNORMAL HIGH (ref 70–99)
Potassium: 4.1 mmol/L (ref 3.5–5.1)
Sodium: 136 mmol/L (ref 135–145)

## 2022-07-06 MED ORDER — KETOROLAC TROMETHAMINE 15 MG/ML IJ SOLN
7.5000 mg | Freq: Four times a day (QID) | INTRAMUSCULAR | Status: AC
  Administered 2022-07-06 (×2): 7.5 mg via INTRAVENOUS
  Filled 2022-07-06 (×2): qty 1

## 2022-07-06 MED ORDER — HYDROMORPHONE HCL 2 MG PO TABS: 4.0000 mg | ORAL_TABLET | ORAL | Status: DC | PRN

## 2022-07-06 MED ORDER — HYDROMORPHONE HCL 2 MG PO TABS
4.0000 mg | ORAL_TABLET | ORAL | Status: DC | PRN
  Administered 2022-07-06 – 2022-07-07 (×4): 6 mg via ORAL
  Filled 2022-07-06 (×4): qty 3

## 2022-07-06 MED ORDER — HYDROMORPHONE HCL 2 MG PO TABS
4.0000 mg | ORAL_TABLET | ORAL | Status: DC | PRN
  Filled 2022-07-06: qty 2

## 2022-07-06 NOTE — Plan of Care (Signed)
  Problem: Education: Goal: Knowledge of General Education information will improve Description: Including pain rating scale, medication(s)/side effects and non-pharmacologic comfort measures Outcome: Progressing   Problem: Clinical Measurements: Goal: Ability to maintain clinical measurements within normal limits will improve Outcome: Progressing Goal: Will remain free from infection Outcome: Progressing Goal: Diagnostic test results will improve Outcome: Progressing Goal: Respiratory complications will improve Outcome: Progressing Goal: Cardiovascular complication will be avoided Outcome: Progressing   Problem: Activity: Goal: Risk for activity intolerance will decrease Outcome: Progressing   Problem: Nutrition: Goal: Adequate nutrition will be maintained Outcome: Progressing   Problem: Pain Managment: Goal: General experience of comfort will improve Outcome: Progressing   Problem: Safety: Goal: Ability to remain free from injury will improve Outcome: Progressing   Problem: Skin Integrity: Goal: Risk for impaired skin integrity will decrease Outcome: Progressing   Problem: Activity: Goal: Ability to avoid complications of mobility impairment will improve Outcome: Progressing Goal: Ability to tolerate increased activity will improve Outcome: Progressing   Problem: Pain Management: Goal: Pain level will decrease with appropriate interventions Outcome: Progressing   Problem: Skin Integrity: Goal: Will show signs of wound healing Outcome: Progressing

## 2022-07-06 NOTE — Progress Notes (Signed)
Physical Therapy Treatment Patient Details Name: Becky Lawson MRN: [id number] DOB: 29-Mar-1951 Today's Date: 07/06/2022   History of Present Illness 71 yo female s/p R DA THA 07/05/22. PMH: L THA Nov 2023, chronic pain    PT Comments    Pt requesting IV pain meds however is willing to do PT, able to tolerate  incr gait distance and review of HEP. Pt is asking about having HHPT since she lives alone.   Recommendations for follow up therapy are one component of a multi-disciplinary discharge planning process, led by the attending physician.  Recommendations may be updated based on patient status, additional functional criteria and insurance authorization.  Follow Up Recommendations  Follow physician's recommendations for discharge plan and follow up therapies (pt requests HHPT)     Assistance Recommended at Discharge Intermittent Supervision/Assistance  Patient can return home with the following Assistance with cooking/housework;Assist for transportation;Help with stairs or ramp for entrance   Equipment Recommendations  None recommended by PT    Recommendations for Other Services       Precautions / Restrictions Precautions Precautions: Fall Restrictions Weight Bearing Restrictions: No RLE Weight Bearing: Weight bearing as tolerated     Mobility  Bed Mobility Overal bed mobility: Needs Assistance Bed Mobility: Supine to Sit     Supine to sit: Min assist     General bed mobility comments:  (in recliner)    Transfers Overall transfer level: Needs assistance Equipment used: Rolling walker (2 wheels) Transfers: Sit to/from Stand Sit to Stand: Min assist, Min guard           General transfer comment: verbal cues for hand placement    Ambulation/Gait Ambulation/Gait assistance: Min guard, Min assist Gait Distance (Feet): 80 Feet (10' more) Assistive device: Rolling walker (2 wheels) Gait Pattern/deviations: Step-to pattern, Step-through pattern, Decreased stride  length, Decreased stance time - right, Trunk flexed       General Gait Details: verbal cues for sequence, posture and RW proximity   Stairs             Wheelchair Mobility    Modified Rankin (Stroke Patients Only)       Balance                                            Cognition Arousal/Alertness: Awake/alert Behavior During Therapy: WFL for tasks assessed/performed Overall Cognitive Status: Within Functional Limits for tasks assessed Area of Impairment: Following commands, Safety/judgement                       Following Commands: Follows multi-step commands inconsistently, Follows one step commands with increased time Safety/Judgement: Decreased awareness of safety     General Comments: improved attention to task this pm        Exercises Total Joint Exercises Ankle Circles/Pumps: AROM, Both, 10 reps Quad Sets: AROM, 10 reps, Both Heel Slides: AAROM, Right, 10 reps Hip ABduction/ADduction: AAROM, Right, 10 reps    General Comments        Pertinent Vitals/Pain Pain Assessment Pain Assessment: Faces Pain Score: 0-No pain Faces Pain Scale: Hurts a little bit Pain Location: right hip Pain Descriptors / Indicators: Burning, Sore, Grimacing, Guarding Pain Intervention(s): Limited activity within patient's tolerance, Monitored during session, Patient requesting pain meds-RN notified, Repositioned, Ice applied, Premedicated before session    Home Living  Prior Function            PT Goals (current goals can now be found in the care plan section) Acute Rehab PT Goals Patient Stated Goal: home, have HHPT PT Goal Formulation: With patient Time For Goal Achievement: 07/20/22 Potential to Achieve Goals: Good Progress towards PT goals: Progressing toward goals    Frequency    7X/week      PT Plan Current plan remains appropriate    Co-evaluation              AM-PAC PT "6  Clicks" Mobility   Outcome Measure  Help needed turning from your back to your side while in a flat bed without using bedrails?: A Little Help needed moving from lying on your back to sitting on the side of a flat bed without using bedrails?: A Little Help needed moving to and from a bed to a chair (including a wheelchair)?: A Little Help needed standing up from a chair using your arms (e.g., wheelchair or bedside chair)?: A Little Help needed to walk in hospital room?: A Little Help needed climbing 3-5 steps with a railing? : A Little 6 Click Score: 18    End of Session Equipment Utilized During Treatment: Gait belt Activity Tolerance: Patient tolerated treatment well Patient left: in chair;with call bell/phone within reach;with chair alarm set Nurse Communication: Patient requests pain meds (IV meds) PT Visit Diagnosis: Other abnormalities of gait and mobility (R26.89);Difficulty in walking, not elsewhere classified (R26.2)     Time: 1194-1740 PT Time Calculation (min) (ACUTE ONLY): 19 min  Charges:  $Gait Training: 8-22 mins                     Baxter Flattery, PT  Acute Rehab Dept [facility]) [phone]  [facility] Weekend Pager [facility] only)  [phone]  07/06/2022    [facility] 07/06/2022, 3:38 PM

## 2022-07-06 NOTE — Progress Notes (Signed)
Patient ID: Becky Lawson, female   DOB: Nov 06, 1950, 71 y.o.   MRN: [id number] Subjective: 1 Day Post-Op Procedure(s) (LRB): TOTAL HIP ARTHROPLASTY ANTERIOR APPROACH (Right)    Patient reports pain as moderate to severe.  Unable to sleep last night.  Focused on pain levels.  Objective:   VITALS:   Vitals:   07/06/22 0146 07/06/22 0516  BP: (!) 155/69 (!) 140/60  Pulse: 82 81  Resp: 17 17  Temp: 98.2 F (36.8 C) 98.5 F (36.9 C)  SpO2: 97% 95%    Neurovascular intact Incision: dressing C/D/I  LABS Recent Labs    07/06/22 0329  HGB 9.4*  HCT 28.0*  WBC 13.3*  PLT 216    Recent Labs    07/06/22 0329  NA 136  K 4.1  BUN 13  CREATININE 0.72  GLUCOSE 141*    No results for input(s): "LABPT", "INR" in the last 72 hours.   Assessment/Plan: 1 Day Post-Op Procedure(s) (LRB): TOTAL HIP ARTHROPLASTY ANTERIOR APPROACH (Right)   Advance diet Up with therapy Unlikely that we will be able to control pain adequately today to get her home.  Needs to stop using IV breakthrough meds.  X-rays of her hip look great without periprosthetic concerns  Work with therapy with goal of home discharge if not today then tomorrow

## 2022-07-06 NOTE — Evaluation (Signed)
Physical Therapy Evaluation Patient Details Name: Becky Lawson MRN: [id number] DOB: 1951-03-07 Today's Date: 07/06/2022  History of Present Illness  71 yo female s/p R DA THA 07/05/22. PMH: L THA Nov 2023, chronic pain  Clinical Impression  Pt is s/p THA resulting in the deficits listed below (see PT Problem List).  Pt able to ambulate short distance, poor safety awareness, requiring assist for balance and safety cues throughout. Will likely not be ready for d/c today (agree with MD)  Pt will benefit from skilled PT to increase their independence and safety with mobility to allow discharge to the venue listed below.         Recommendations for follow up therapy are one component of a multi-disciplinary discharge planning process, led by the attending physician.  Recommendations may be updated based on patient status, additional functional criteria and insurance authorization.  Follow Up Recommendations Follow physician's recommendations for discharge plan and follow up therapies      Assistance Recommended at Discharge Intermittent Supervision/Assistance  Patient can return home with the following  Assistance with cooking/housework;Assist for transportation;Help with stairs or ramp for entrance    Equipment Recommendations None recommended by PT  Recommendations for Other Services       Functional Status Assessment Patient has had a recent decline in their functional status and demonstrates the ability to make significant improvements in function in a reasonable and predictable amount of time.     Precautions / Restrictions Precautions Precautions: Fall Restrictions Weight Bearing Restrictions: No RLE Weight Bearing: Weight bearing as tolerated      Mobility  Bed Mobility Overal bed mobility: Needs Assistance Bed Mobility: Supine to Sit     Supine to sit: Min assist     General bed mobility comments: cues for sequence, technique, self assist    Transfers Overall  transfer level: Needs assistance Equipment used: Rolling walker (2 wheels) Transfers: Sit to/from Stand Sit to Stand: Min assist, From elevated surface           General transfer comment: multi-modal cues for hand placement (STS from bed and BSC)    Ambulation/Gait Ambulation/Gait assistance: Min assist Gait Distance (Feet): 15 Feet (x2) Assistive device: Rolling walker (2 wheels) Gait Pattern/deviations: Step-to pattern, Trunk flexed, Decreased stance time - right          Stairs            Wheelchair Mobility    Modified Rankin (Stroke Patients Only)       Balance                                             Pertinent Vitals/Pain Pain Assessment Pain Assessment: Faces Pain Score: 0-No pain Pain Location: right hip Pain Descriptors / Indicators: Burning, Sore, Grimacing, Guarding Pain Intervention(s): Limited activity within patient's tolerance, Monitored during session, Premedicated before session, Repositioned, Ice applied    Home Living Family/patient expects to be discharged to:: Private residence Living Arrangements: Alone   Type of Home: House Home Access: Stairs to enter Entrance Stairs-Rails: Right;Left;Can reach both Technical brewer of Steps: 3-4   Home Layout: One level Home Equipment: Conservation officer, nature (2 wheels);BSC/3in1;Shower seat      Prior Function Prior Level of Function : Independent/Modified Independent                     Hand Dominance  Extremity/Trunk Assessment   Upper Extremity Assessment Upper Extremity Assessment: Overall WFL for tasks assessed    Lower Extremity Assessment Lower Extremity Assessment: RLE deficits/detail RLE Deficits / Details: ankle WFL, testing limited by pain; AAROM hip and knee grossly WFL, strength at least 2+ to 3/5       Communication   Communication: No difficulties  Cognition Arousal/Alertness: Awake/alert Behavior During Therapy: WFL for tasks  assessed/performed Overall Cognitive Status: Within Functional Limits for tasks assessed Area of Impairment: Following commands, Safety/judgement                       Following Commands: Follows multi-step commands inconsistently, Follows one step commands with increased time Safety/Judgement: Decreased awareness of safety     General Comments: easily distracted, requires redirection, decr safety awareness        General Comments      Exercises     Assessment/Plan    PT Assessment Patient needs continued PT services  PT Problem List Decreased strength;Decreased mobility;Decreased range of motion;Decreased activity tolerance;Decreased balance;Decreased knowledge of use of DME;Pain;Decreased safety awareness       PT Treatment Interventions DME instruction;Therapeutic exercise;Gait training;Stair training;Functional mobility training;Therapeutic activities;Patient/family education    PT Goals (Current goals can be found in the Care Plan section)  Acute Rehab PT Goals Patient Stated Goal: home, have HHPT PT Goal Formulation: With patient Time For Goal Achievement: 07/20/22 Potential to Achieve Goals: Good    Frequency 7X/week     Co-evaluation               AM-PAC PT "6 Clicks" Mobility  Outcome Measure Help needed turning from your back to your side while in a flat bed without using bedrails?: A Little Help needed moving from lying on your back to sitting on the side of a flat bed without using bedrails?: A Little Help needed moving to and from a bed to a chair (including a wheelchair)?: A Little Help needed standing up from a chair using your arms (e.g., wheelchair or bedside chair)?: A Little Help needed to walk in hospital room?: A Little Help needed climbing 3-5 steps with a railing? : A Lot 6 Click Score: 17    End of Session Equipment Utilized During Treatment: Gait belt Activity Tolerance: Patient tolerated treatment well;Patient limited by  pain Patient left: in chair;with call bell/phone within reach;with chair alarm set Nurse Communication: Mobility status PT Visit Diagnosis: Other abnormalities of gait and mobility (R26.89);Difficulty in walking, not elsewhere classified (R26.2)    Time: 4801-6553 PT Time Calculation (min) (ACUTE ONLY): 24 min   Charges:   PT Evaluation $PT Eval Low Complexity: 1 Low PT Treatments $Gait Training: 8-22 mins        Baxter Flattery, PT  Acute Rehab Dept [facility]) [phone]  [facility] Weekend Pager [facility] only)  [phone]  07/06/2022   [facility] 07/06/2022, 10:26 AM

## 2022-07-06 NOTE — TOC Transition Note (Signed)
Transition of Care [facility]) - CM/SW Discharge Note   Patient Details  Name: Becky Lawson MRN: [id number] Date of Birth: 11-05-1950  [facility] CM/SW Contact:  Lennart Pall, LCSW Phone Number: 07/06/2022, 9:24 AM   Clinical Narrative:    Met with pt and confirming she has needed DME at home.  Plan for HEP.  No TOC needs.   Final next level of care: Home/Self Care Barriers to Discharge: No Barriers Identified   Patient Goals and CMS Choice Patient states their goals for this hospitalization and ongoing recovery are:: return home      Discharge Placement                       Discharge Plan and Services                DME Arranged: N/A                    Social Determinants of Health (SDOH) Interventions     Readmission Risk Interventions     No data to display

## 2022-07-07 DIAGNOSIS — M1611 Unilateral primary osteoarthritis, right hip: Secondary | ICD-10-CM | POA: Diagnosis not present

## 2022-07-07 LAB — CBC
HCT: 25.8 % — ABNORMAL LOW (ref 36.0–46.0)
Hemoglobin: 8.6 g/dL — ABNORMAL LOW (ref 12.0–15.0)
MCH: 31.2 pg (ref 26.0–34.0)
MCHC: 33.3 g/dL (ref 30.0–36.0)
MCV: 93.5 fL (ref 80.0–100.0)
Platelets: 200 10*3/uL (ref 150–400)
RBC: 2.76 MIL/uL — ABNORMAL LOW (ref 3.87–5.11)
RDW: 12.7 % (ref 11.5–15.5)
WBC: 14 10*3/uL — ABNORMAL HIGH (ref 4.0–10.5)
nRBC: 0 % (ref 0.0–0.2)

## 2022-07-07 MED ORDER — ASPIRIN 81 MG PO CHEW: 81.0000 mg | CHEWABLE_TABLET | Freq: Two times a day (BID) | ORAL | 0 refills | Status: AC

## 2022-07-07 MED ORDER — SENNA 8.6 MG PO TABS: 2.0000 | ORAL_TABLET | Freq: Every day | ORAL | 0 refills | Status: AC

## 2022-07-07 MED ORDER — ACETAMINOPHEN 500 MG PO TABS: 1000.0000 mg | ORAL_TABLET | Freq: Four times a day (QID) | ORAL | 0 refills | Status: DC

## 2022-07-07 MED ORDER — METHOCARBAMOL 500 MG PO TABS: 500.0000 mg | ORAL_TABLET | Freq: Four times a day (QID) | ORAL | 2 refills | Status: DC | PRN

## 2022-07-07 MED ORDER — KETOROLAC TROMETHAMINE 15 MG/ML IJ SOLN
7.5000 mg | Freq: Four times a day (QID) | INTRAMUSCULAR | Status: DC
  Administered 2022-07-07: 7.5 mg via INTRAVENOUS
  Filled 2022-07-07: qty 1

## 2022-07-07 MED ORDER — NALOXONE HCL 4 MG/0.1ML NA LIQD: NASAL | 0 refills | Status: DC

## 2022-07-07 NOTE — Progress Notes (Signed)
Subjective: 2 Days Post-Op Procedure(s) (LRB): TOTAL HIP ARTHROPLASTY ANTERIOR APPROACH (Right) Patient reports pain as moderate.   Patient seen in rounds for Dr. Alvan Dame. Patient is resting in bed on exam this morning. She is in good spirits and seems to have good insight to her pain expectations post-op. She tells me that her daughter will not be staying with her after surgery any longer, even though that was our pre-op plan. Instead, neighbors will be coming by to help her. No acute events overnight.  We will continue  therapy today.   Objective: Vital signs in last 24 hours: Temp:  [98.1 F (36.7 C)-99.4 F (37.4 C)] 98.9 F (37.2 C) (11/02 0635) Pulse Rate:  [84-104] 90 (11/02 0635) Resp:  [16-20] 18 (11/02 0635) BP: (115-170)/(53-86) 116/53 (11/02 0635) SpO2:  [91 %-96 %] 94 % (11/02 0635)  Intake/Output from previous day:  Intake/Output Summary (Last 24 hours) at 07/07/2022 0824 Last data filed at 07/07/2022 0600 Gross per 24 hour  Intake 1981.84 ml  Output 2500 ml  Net -518.16 ml     Intake/Output this shift: No intake/output data recorded.  Labs: Recent Labs    07/06/22 0329 07/07/22 0323  HGB 9.4* 8.6*   Recent Labs    07/06/22 0329 07/07/22 0323  WBC 13.3* 14.0*  RBC 3.00* 2.76*  HCT 28.0* 25.8*  PLT 216 200   Recent Labs    07/06/22 0329  NA 136  K 4.1  CL 105  CO2 24  BUN 13  CREATININE 0.72  GLUCOSE 141*  CALCIUM 8.4*   No results for input(s): "LABPT", "INR" in the last 72 hours.  Exam: General - Patient is Alert and Oriented Extremity - Neurologically intact Sensation intact distally Intact pulses distally Dorsiflexion/Plantar flexion intact Dressing - dressing C/D/I Motor Function - intact, moving foot and toes well on exam.   Past Medical History:  Diagnosis Date   Arthritis    Benign cyst of right kidney 2014   2.7 cm right kidney cyst seen on imaging 2014 but was c/o right flank pain with new persistent proteinuria  (fortunately hematuria had resolved) so repeat US 11/2016 showed right kidney still with 2.6 cm simple cyst and otherwise nml.   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, genotype 1b, followed at [facility]   Hyperlipidemia    Hypertension    Hypothyroidism    Jaundice 11/01/2012   Pruritic disorder 02/13/2013   Sleep apnea    Was diagnosed approximately 20 years ago, but does not wear CPAP patient stated "I gave it back.Marland KitchenMarland KitchenI couldn't wear that thing it was horrible"   Spinal stenosis    Thyroid disease    [employer] 06/15/2020    Assessment/Plan: 2 Days Post-Op Procedure(s) (LRB): TOTAL HIP ARTHROPLASTY ANTERIOR APPROACH (Right) Principal Problem:   S/P total right hip arthroplasty Active Problems:   Status post total replacement of right hip  Estimated body mass index is 31.14 kg/m as calculated from the following:   Height as of this encounter: 5' 5.5" (1.664 m).   Weight as of this encounter: 86.2 kg. Advance diet Up with therapy D/C IV fluids  DVT Prophylaxis - Aspirin Weight bearing as tolerated.  Hgb stable at 8.6  this AM.  Hip replacement patients typically do not require physical therapy, including HHPT. I do not expect that she should need this, based on her success with prior left THA. We will see how she progresses today.  Plan is to go Home after hospital stay. Plan for discharge today after meeting goals with therapy. Follow up in the office in 2 weeks.   Griffith Citron, PA-C Orthopedic Surgery [phone] 07/07/2022, 8:24 AM

## 2022-07-07 NOTE — Progress Notes (Signed)
Physical Therapy Treatment Patient Details Name: Becky Lawson MRN: [id number] DOB: September 05, 1951 Today's Date: 07/07/2022   History of Present Illness 71 yo female s/p R DA THA 07/05/22. PMH: L THA Nov 2023, chronic pain    PT Comments    Pt is pleasant, somewhat lethargic this morning. She stated she's tired, but that she slept well last night. Pt was falling asleep and losing balance posteriorly while sitting at edge of bed, vital signs stable (BP 154/65, HR 98, SaO2 94% on room air). She ambulated 150' with RW, completed stair training, and demonstrates good understanding of HEP. Pt stated her church friends will be assisting her at home. She is ready to DC home from a PT standpoint.     Recommendations for follow up therapy are one component of a multi-disciplinary discharge planning process, led by the attending physician.  Recommendations may be updated based on patient status, additional functional criteria and insurance authorization.  Follow Up Recommendations  Follow physician's recommendations for discharge plan and follow up therapies     Assistance Recommended at Discharge Intermittent Supervision/Assistance  Patient can return home with the following Assistance with cooking/housework;Assist for transportation;Help with stairs or ramp for entrance;A little help with bathing/dressing/bathroom   Equipment Recommendations  None recommended by PT    Recommendations for Other Services       Precautions / Restrictions Precautions Precautions: Fall Restrictions Weight Bearing Restrictions: No RLE Weight Bearing: Weight bearing as tolerated     Mobility  Bed Mobility Overal bed mobility: Needs Assistance Bed Mobility: Supine to Sit, Sit to Supine     Supine to sit: Min assist Sit to supine: Min assist   General bed mobility comments: instructed pt in using gait belt as a leg lifter, min A to raise trunk, VCs technique, min A for balance sitting EOB as pt was falling  asleep and leaning posteriorly. BP 154/65 sitting, HR 98, SaO2 94% on room air.    Transfers Overall transfer level: Needs assistance Equipment used: Rolling walker (2 wheels) Transfers: Sit to/from Stand Sit to Stand: Min guard           General transfer comment: verbal cues for hand placement    Ambulation/Gait Ambulation/Gait assistance: Min guard Gait Distance (Feet): 150 Feet Assistive device: Rolling walker (2 wheels) Gait Pattern/deviations: Step-to pattern, Step-through pattern, Decreased stride length, Decreased stance time - right, Trunk flexed Gait velocity: decr     General Gait Details: VCs for posture   Stairs Stairs: Yes Stairs assistance: Min guard Stair Management: Two rails, Forwards, Step to pattern Number of Stairs: 5 General stair comments: VCs sequencing, no physical assist needed   Wheelchair Mobility    Modified Rankin (Stroke Patients Only)       Balance Overall balance assessment: Needs assistance Sitting-balance support: Feet supported, Bilateral upper extremity supported Sitting balance-Leahy Scale: Poor Sitting balance - Comments: posterior lean 2* lethargy   Standing balance support: Reliant on assistive device for balance, Bilateral upper extremity supported, During functional activity Standing balance-Leahy Scale: Fair                              Cognition Arousal/Alertness: Lethargic Behavior During Therapy: WFL for tasks assessed/performed Overall Cognitive Status: Within Functional Limits for tasks assessed                                 General  Comments: pt falling asleep while sitting at edge of bed, opens eyes to verbal stimulii, stated she slept well last night        Exercises Total Joint Exercises Ankle Circles/Pumps: AROM, Both, 20 reps, Seated Quad Sets: AROM, 5 reps, Right Short Arc Quad: AROM, Right, Supine, 10 reps Heel Slides: AAROM, Right, 10 reps Hip ABduction/ADduction:  AAROM, Right, 10 reps Long Arc Quad: AROM, Right, 10 reps, Seated    General Comments        Pertinent Vitals/Pain Pain Assessment Pain Score: 3  Pain Location: right hip with walking Pain Descriptors / Indicators: Sore Pain Intervention(s): Limited activity within patient's tolerance, Monitored during session, Ice applied (per RN, pt declined pain medication prior to PT)    Home Living                          Prior Function            PT Goals (current goals can now be found in the care plan section) Acute Rehab PT Goals Patient Stated Goal: home, have HHPT PT Goal Formulation: With patient Time For Goal Achievement: 07/20/22 Potential to Achieve Goals: Good Progress towards PT goals: Progressing toward goals    Frequency    7X/week      PT Plan Current plan remains appropriate    Co-evaluation              AM-PAC PT "6 Clicks" Mobility   Outcome Measure  Help needed turning from your back to your side while in a flat bed without using bedrails?: None Help needed moving from lying on your back to sitting on the side of a flat bed without using bedrails?: A Little Help needed moving to and from a bed to a chair (including a wheelchair)?: A Little Help needed standing up from a chair using your arms (e.g., wheelchair or bedside chair)?: A Little Help needed to walk in hospital room?: None Help needed climbing 3-5 steps with a railing? : None 6 Click Score: 21    End of Session Equipment Utilized During Treatment: Gait belt Activity Tolerance: Patient tolerated treatment well Patient left: in bed;with bed alarm set;with call bell/phone within reach Nurse Communication: Mobility status;Other (comment) (pt lethargic) PT Visit Diagnosis: Other abnormalities of gait and mobility (R26.89);Difficulty in walking, not elsewhere classified (R26.2)     Time: 0355-9741 PT Time Calculation (min) (ACUTE ONLY): 35 min  Charges:  $Gait Training: 8-22  mins $Therapeutic Exercise: 8-22 mins                     Blondell Reveal Kistler PT 07/07/2022  Acute Rehabilitation Services  Office [phone]

## 2022-07-12 ENCOUNTER — Ambulatory Visit: Payer: PPO | Admitting: Behavioral Health

## 2022-07-14 NOTE — Discharge Summary (Signed)
Patient ID: Becky Lawson MRN: [id number] DOB/AGE: 71-Dec-1952 23 y.o.  Admit date: 07/05/2022 Discharge date: 07/07/2022  Admission Diagnoses:  Right hip osteoarthritis  Discharge Diagnoses:  Principal Problem:   S/P total right hip arthroplasty Active Problems:   Status post total replacement of right hip   Past Medical History:  Diagnosis Date   Arthritis    Benign cyst of right kidney 2014   2.7 cm right kidney cyst seen on imaging 2014 but was c/o right flank pain with new persistent proteinuria (fortunately hematuria had resolved) so repeat US 11/2016 showed right kidney still with 2.6 cm simple cyst and otherwise nml.   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 female who was admitted 07/05/2022 for operative treatment ofS/P total right hip arthroplasty. Patient has severe unremitting pain that affects sleep, daily activities, and work/hobbies. After pre-op clearance the patient was taken to the operating room on 07/05/2022 and underwent  Procedure(s): TOTAL HIP ARTHROPLASTY ANTERIOR APPROACH.    Patient was given perioperative antibiotics:  Anti-infectives (From admission, onward)    Start     Dose/Rate Route Frequency  Ordered Stop   07/05/22 2000  ceFAZolin (ANCEF) IVPB 2g/100 mL premix        2 g 200 mL/hr over 30 Minutes Intravenous Every 6 hours 07/05/22 1754 07/06/22 0241   07/05/22 1200  ceFAZolin (ANCEF) IVPB 2g/100 mL premix        2 g 200 mL/hr over 30 Minutes Intravenous On call to O.R. 07/05/22 1154 07/05/22 1423   07/05/22 1155  vancomycin (VANCOCIN) IVPB 1000 mg/200 mL premix        1,000 mg 200 mL/hr over 60 Minutes Intravenous 60 min pre-op 07/05/22 1155 07/05/22 1410        Patient was given sequential compression devices, early ambulation, and chemoprophylaxis to prevent DVT. Patient worked with PT and was meeting their goals regarding safe ambulation and transfers.  Patient benefited maximally from hospital stay and there were no complications.    Recent vital signs: No data found.   Recent laboratory studies: No results for input(s): "WBC", "HGB", "HCT", "PLT", "NA", "K", "CL", "CO2", "BUN", "CREATININE", "GLUCOSE", "INR", "CALCIUM" in the last 72 hours.  Invalid input(s): "PT", "2"   Discharge Medications:   Allergies as of 07/07/2022   No Known Allergies      Medication List     TAKE these medications    acetaminophen 500 MG tablet Commonly known as: TYLENOL Take 2 tablets (1,000 mg total) by mouth every 6 (six) hours.   ALPRAZolam 1 MG tablet Commonly known as: XANAX TAKE 1/2 TO 1 TABLET BY  MOUTH twice daily AS NEEDED FOR ANXIETY What changed:  how much to take how to take this when to take this additional instructions   amLODipine 10 MG tablet Commonly known as: NORVASC Take 1 tablet (10 mg total) by mouth daily.   aspirin 81 MG chewable tablet Chew 1 tablet (81 mg total) by mouth 2 (two) times daily for 28 days.   atorvastatin 40 MG tablet Commonly known as: LIPITOR Take 1 tablet (40 mg total) by mouth daily. What changed: when to take this   azelastine 0.1 % nasal spray Commonly known as: ASTELIN Place 1-2 sprays into both nostrils 2 (two) times  daily. Use in each nostril as directed What changed:  when to take this reasons to take this   dextroamphetamine 10 MG tablet Commonly known as: DEXTROSTAT TAKE ONE TABLET BY MOUTH every morning   docusate sodium 100 MG capsule Commonly known as: COLACE Take 1 capsule (100 mg total) by mouth 2 (two) times daily.   HYDROmorphone 4 MG tablet Commonly known as: DILAUDID Take 4 mg by mouth 3 (three) times daily as needed for severe pain.   hydrOXYzine 25 MG tablet Commonly known as: ATARAX Take 1 tablet (25 mg total) by mouth every 8 (eight) hours as needed. What changed: reasons to take this   lamoTRIgine 100 MG tablet Commonly known as: LAMICTAL TAKE TWO TABLETS BY MOUTH EVERY MORNING and TAKE ONE TABLET BY MOUTH EVERY EVENING What changed: See the new instructions.   levothyroxine 100 MCG tablet Commonly known as: SYNTHROID TAKE ONE TABLET BY MOUTH daily BEFORE breakfast   methocarbamol 500 MG tablet Commonly known as: ROBAXIN Take 1 tablet (500 mg total) by mouth every 6 (six) hours as needed for muscle spasms (muscle pain).   mirtazapine 7.5 MG tablet Commonly known as: REMERON TAKE 1-2 TABLETS (15 MG TOTAL) AT BEDTIME AS NEEDED FOR SLEEP.   naloxone 4 MG/0.1ML Liqd nasal spray kit Commonly known as: NARCAN Spray into nostril with signs of oversedation/overdose   omeprazole 40 MG capsule Commonly known as: PRILOSEC Take 1 capsule (40 mg total) by mouth 2 (two) times daily. What changed: when to take this   polyethylene glycol 17 g packet Commonly known as: MIRALAX / GLYCOLAX Take 17 g by mouth daily as needed for mild constipation.   QUEtiapine 50 MG tablet Commonly known as: SEROQUEL TAKE ONE TABLET BY MOUTH ONCE DAILY What changed:  how much to take how to take this when to take this additional instructions   senna 8.6 MG Tabs tablet Commonly known as: SENOKOT Take 2 tablets (17.2 mg total) by mouth at bedtime for 14 days.   sertraline 50 MG  tablet Commonly known as: ZOLOFT TAKE ONE TABLET BY MOUTH EVERY MORNING   traZODone 150 MG tablet Commonly known as: DESYREL Take 2 tablets (300 mg total) by mouth at bedtime.   Vilazodone HCl 20 MG Tabs Take 1 tablet (20 mg total) by mouth daily.   Vitamin D (Ergocalciferol) 1.25 MG (50000 UNIT) Caps capsule Commonly known as: DRISDOL Take 1 capsule (50,000 Units total) by mouth every 7 (seven) days. Sunday               Discharge Care Instructions  (From admission, onward)           Start     Ordered   07/07/22 0000  Change dressing       Comments: Maintain surgical dressing until follow up in the clinic. If the edges start to  pull up, may reinforce with tape. If the dressing is no longer working, may remove and cover with gauze and tape, but must keep the area dry and clean.  Call with any questions or concerns.   07/07/22 1313   07/07/22 0000  Change dressing       Comments: Maintain surgical dressing until follow up in the clinic. If the edges start to pull up, may reinforce with tape. If the dressing is no longer working, may remove and cover with gauze and tape, but must keep the area dry and clean.  Call with any questions or concerns.   07/07/22 1313            Diagnostic Studies: DG Pelvis Portable  Result Date: 07/05/2022 CLINICAL DATA:  Right hip arthroplasty EXAM: PORTABLE PELVIS 1-2 VIEWS COMPARISON:  None Available. FINDINGS: Right hip arthroplasty in expected alignment. No periprosthetic lucency or fracture. Recent postsurgical change includes air and edema in the soft tissues. Prior left hip arthroplasty is intact. IMPRESSION: Right hip arthroplasty without immediate postoperative complication. Electronically Signed   By: Keith Rake M.D.   On: 07/05/2022 18:00   DG HIP UNILAT WITH PELVIS 1V RIGHT  Result Date: 07/05/2022 CLINICAL DATA:  Status post right hip arthroplasty. EXAM: DG HIP (WITH OR WITHOUT PELVIS) 1V RIGHT; DG C-ARM 1-60 MIN-NO  REPORT Radiation exposure index: 1.1315 mGy. COMPARISON:  None Available. FINDINGS: Two intraoperative fluoroscopic images were obtained of the right hip. The right acetabular and femoral components are well situated. IMPRESSION: Fluoroscopic guidance provided during right total hip arthroplasty. Electronically Signed   By: Marijo Conception M.D.   On: 07/05/2022 15:37   DG C-Arm 1-60 Min-No Report  Result Date: 07/05/2022 Fluoroscopy was utilized by the requesting physician.  No radiographic interpretation.   DG C-Arm 1-60 Min-No Report  Result Date: 07/05/2022 Fluoroscopy was utilized by the requesting physician.  No radiographic interpretation.   ECHOCARDIOGRAM COMPLETE  Result Date: 06/21/2022    ECHOCARDIOGRAM REPORT   Patient Name:   BAYLYN SICKLES Date of Exam: 06/21/2022 Medical Rec #:  [id number]    Height:       64.5 in Accession #:    [id number]   Weight:       194.8 lb Date of Birth:  [birth year]    BSA:          1.945 m Patient Age:    24 years     BP:           163/83 mmHg Patient Gender: F            HR:           99 bpm. Exam Location:  [place] Procedure: 2D Echo, Cardiac Doppler and Color Doppler Indications:    Pre-op, Hip replacement  History:        Patient has no prior history of Echocardiogram examinations.                 Signs/Symptoms:Murmur.  Sonographer:    Merrie Roof RDCS Referring Phys: [place]  1. Left ventricular ejection fraction, by estimation, is 60 to 65%. The left ventricle has normal function. The left ventricle has no regional wall motion abnormalities. Left ventricular diastolic parameters are consistent with Grade I diastolic dysfunction (impaired relaxation).  2. Right ventricular systolic function is normal. The right ventricular size is normal. There is normal pulmonary artery systolic pressure.  3. Left atrial size was severely dilated.  4. The mitral  valve is degenerative. Mild mitral valve regurgitation. No evidence of mitral  stenosis.  5. The aortic valve is tricuspid. Aortic valve regurgitation is not visualized. Aortic valve sclerosis is present, with no evidence of aortic valve stenosis.  6. The inferior vena cava is normal in size with greater than 50% respiratory variability, suggesting right atrial pressure of 3 mmHg. FINDINGS  Left Ventricle: Left ventricular ejection fraction, by estimation, is 60 to 65%. The left ventricle has normal function. The left ventricle has no regional wall motion abnormalities. The left ventricular internal cavity size was normal in size. There is  no left ventricular hypertrophy. Left ventricular diastolic parameters are consistent with Grade I diastolic dysfunction (impaired relaxation). Indeterminate filling pressures. Right Ventricle: The right ventricular size is normal. No increase in right ventricular wall thickness. Right ventricular systolic function is normal. There is normal pulmonary artery systolic pressure. The tricuspid regurgitant velocity is 2.28 m/s, and  with an assumed right atrial pressure of 3 mmHg, the estimated right ventricular systolic pressure is 64.4 mmHg. Left Atrium: Left atrial size was severely dilated. Right Atrium: Right atrial size was normal in size. Pericardium: There is no evidence of pericardial effusion. Mitral Valve: The mitral valve is degenerative in appearance. Mild mitral annular calcification. Mild mitral valve regurgitation. No evidence of mitral valve stenosis. Tricuspid Valve: The tricuspid valve is normal in structure. Tricuspid valve regurgitation is mild . No evidence of tricuspid stenosis. Aortic Valve: The aortic valve is tricuspid. Aortic valve regurgitation is not visualized. Aortic valve sclerosis is present, with no evidence of aortic valve stenosis. Aortic valve mean gradient measures 5.0 mmHg. Aortic valve peak gradient measures 8.1  mmHg. Aortic valve area, by VTI measures 2.76 cm. Pulmonic Valve: The pulmonic valve was normal in structure.  Pulmonic valve regurgitation is not visualized. No evidence of pulmonic stenosis. Aorta: The aortic root is normal in size and structure. Venous: The pulmonary veins were not well visualized. The inferior vena cava is normal in size with greater than 50% respiratory variability, suggesting right atrial pressure of 3 mmHg. IAS/Shunts: No atrial level shunt detected by color flow Doppler.  LEFT VENTRICLE PLAX 2D LVIDd:         5.10 cm   Diastology LVIDs:         3.00 cm   LV e' medial:    6.20 cm/s LV PW:         0.70 cm   LV E/e' medial:  12.9 LV IVS:        1.00 cm   LV e' lateral:   10.00 cm/s LVOT diam:     2.00 cm   LV E/e' lateral: 8.0 LV SV:         78 LV SV Index:   40 LVOT Area:     3.14 cm  RIGHT VENTRICLE RV Basal diam:  3.20 cm RV S prime:     10.90 cm/s TAPSE (M-mode): 2.2 cm LEFT ATRIUM           Index        RIGHT ATRIUM           Index LA diam:      3.70 cm 1.90 cm/m   RA Area:     13.10 cm LA Vol (A2C): 81.4 ml 41.85 ml/m  RA Volume:   29.90 ml  15.37 ml/m LA Vol (A4C): 99.0 ml 50.90 ml/m  AORTIC VALVE AV Area (Vmax):    2.39 cm AV Area (Vmean):   2.14 cm  AV Area (VTI):     2.76 cm AV Vmax:           142.00 cm/s AV Vmean:          104.000 cm/s AV VTI:            0.283 m AV Peak Grad:      8.1 mmHg AV Mean Grad:      5.0 mmHg LVOT Vmax:         108.00 cm/s LVOT Vmean:        70.900 cm/s LVOT VTI:          0.249 m LVOT/AV VTI ratio: 0.88  AORTA Ao Asc diam: 3.40 cm MITRAL VALVE                TRICUSPID VALVE MV Area (PHT): 3.53 cm     TR Peak grad:   20.8 mmHg MV Decel Time: 215 msec     TR Vmax:        228.00 cm/s MV E velocity: 80.20 cm/s MV A velocity: 107.00 cm/s  SHUNTS MV E/A ratio:  0.75         Systemic VTI:  0.25 m                             Systemic Diam: 2.00 cm Shirlee More MD Electronically signed by Shirlee More MD Signature Date/Time: 06/21/2022/5:43:53 PM    Final     Disposition: Discharge disposition: 01-Home or Self Care       Discharge Instructions     Call MD /  Call 911   Complete by: As directed    If you experience chest pain or shortness of breath, CALL 911 and be transported to the hospital emergency room.  If you develope a fever above 101 F, pus (white drainage) or increased drainage or redness at the wound, or calf pain, call your surgeon's office.   Change dressing   Complete by: As directed    Maintain surgical dressing until follow up in the clinic. If the edges start to pull up, may reinforce with tape. If the dressing is no longer working, may remove and cover with gauze and tape, but must keep the area dry and clean.  Call with any questions or concerns.   Change dressing   Complete by: As directed    Maintain surgical dressing until follow up in the clinic. If the edges start to pull up, may reinforce with tape. If the dressing is no longer working, may remove and cover with gauze and tape, but must keep the area dry and clean.  Call with any questions or concerns.   Constipation Prevention   Complete by: As directed    Drink plenty of fluids.  Prune juice may be helpful.  You may use a stool softener, such as Colace (over the counter) 100 mg twice a day.  Use MiraLax (over the counter) for constipation as needed.   Diet - low sodium heart healthy   Complete by: As directed    Increase activity slowly as tolerated   Complete by: As directed    Weight bearing as tolerated with assist device (walker, cane, etc) as directed, use it as long as suggested by your surgeon or therapist, typically at least 4-6 weeks.   Post-operative opioid taper instructions:   Complete by: As directed    POST-OPERATIVE OPIOID TAPER INSTRUCTIONS: It is important to wean off of your opioid  medication as soon as possible. If you do not need pain medication after your surgery it is ok to stop day one. Opioids include: Codeine, Hydrocodone(Norco, Vicodin), Oxycodone(Percocet, oxycontin) and hydromorphone amongst others.  Long term and even short term use of opiods  can cause: Increased pain response Dependence Constipation Depression Respiratory depression And more.  Withdrawal symptoms can include Flu like symptoms Nausea, vomiting And more Techniques to manage these symptoms Hydrate well Eat regular healthy meals Stay active Use relaxation techniques(deep breathing, meditating, yoga) Do Not substitute Alcohol to help with tapering If you have been on opioids for less than two weeks and do not have pain than it is ok to stop all together.  Plan to wean off of opioids This plan should start within one week post op of your joint replacement. Maintain the same interval or time between taking each dose and first decrease the dose.  Cut the total daily intake of opioids by one tablet each day Next start to increase the time between doses. The last dose that should be eliminated is the evening dose.      TED hose   Complete by: As directed    Use stockings (TED hose) for 2 weeks on both leg(s).  You may remove them at night for sleeping.   TED hose   Complete by: As directed    Use stockings (TED hose) for 2 weeks on both leg(s).  You may remove them at night for sleeping.        Follow-up Information     Paralee Cancel, MD. Schedule an appointment as soon as possible for a visit in 2 week(s).   Specialty: Orthopedic Surgery Contact information: [address] [place] [address] [phone]                  Signed: Irving Copas 07/14/2022, 4:05 PM

## 2022-07-18 ENCOUNTER — Telehealth: Payer: Self-pay | Admitting: Pharmacist

## 2022-07-18 NOTE — Progress Notes (Signed)
Chronic Care Management Pharmacy Assistant   Name: Becky Lawson  MRN: [id number] DOB: 01-16-1951   Reason for Encounter: Monthly Medication Coordination Call      Medications: Outpatient Encounter Medications as of 07/18/2022  Medication Sig Note   acetaminophen (TYLENOL) 500 MG tablet Take 2 tablets (1,000 mg total) by mouth every 6 (six) hours.    ALPRAZolam (XANAX) 1 MG tablet TAKE 1/2 TO 1 TABLET BY MOUTH twice daily AS NEEDED FOR ANXIETY (Patient taking differently: Take 1 mg by mouth at bedtime.)    amLODipine (NORVASC) 10 MG tablet Take 1 tablet (10 mg total) by mouth daily.    aspirin 81 MG chewable tablet Chew 1 tablet (81 mg total) by mouth 2 (two) times daily for 28 days.    atorvastatin (LIPITOR) 40 MG tablet Take 1 tablet (40 mg total) by mouth daily. (Patient taking differently: Take 40 mg by mouth at bedtime.)    azelastine (ASTELIN) 0.1 % nasal spray Place 1-2 sprays into both nostrils 2 (two) times daily. Use in each nostril as directed (Patient taking differently: Place 1-2 sprays into both nostrils 2 (two) times daily as needed for rhinitis or allergies. Use in each nostril as directed)    dextroamphetamine (DEXTROSTAT) 10 MG tablet TAKE ONE TABLET BY MOUTH every morning    docusate sodium (COLACE) 100 MG capsule Take 1 capsule (100 mg total) by mouth 2 (two) times daily.    HYDROmorphone (DILAUDID) 4 MG tablet Take 4 mg by mouth 3 (three) times daily as needed for severe pain.    hydrOXYzine (ATARAX) 25 MG tablet Take 1 tablet (25 mg total) by mouth every 8 (eight) hours as needed. (Patient taking differently: Take 25 mg by mouth every 8 (eight) hours as needed for anxiety.)    lamoTRIgine (LAMICTAL) 100 MG tablet TAKE TWO TABLETS BY MOUTH EVERY MORNING and TAKE ONE TABLET BY MOUTH EVERY EVENING (Patient taking differently: Take 100-200 mg by mouth See admin instructions.)    levothyroxine (SYNTHROID) 100 MCG tablet TAKE ONE TABLET BY MOUTH daily BEFORE breakfast     methocarbamol (ROBAXIN) 500 MG tablet Take 1 tablet (500 mg total) by mouth every 6 (six) hours as needed for muscle spasms (muscle pain).    mirtazapine (REMERON) 7.5 MG tablet TAKE 1-2 TABLETS (15 MG TOTAL) AT BEDTIME AS NEEDED FOR SLEEP. 06/20/2022: On hold, pt plans to start back after procedure prn   naloxone (NARCAN) nasal spray 4 mg/0.1 mL Spray into nostril with signs of oversedation/overdose    omeprazole (PRILOSEC) 40 MG capsule Take 1 capsule (40 mg total) by mouth 2 (two) times daily. (Patient taking differently: Take 40 mg by mouth at bedtime.)    polyethylene glycol (MIRALAX / GLYCOLAX) 17 g packet Take 17 g by mouth daily as needed for mild constipation.    QUEtiapine (SEROQUEL) 50 MG tablet TAKE ONE TABLET BY MOUTH ONCE DAILY (Patient taking differently: Take 50 mg by mouth at bedtime.)    senna (SENOKOT) 8.6 MG TABS tablet Take 2 tablets (17.2 mg total) by mouth at bedtime for 14 days.    sertraline (ZOLOFT) 50 MG tablet TAKE ONE TABLET BY MOUTH EVERY MORNING (Patient not taking: Reported on 06/20/2022)    traZODone (DESYREL) 150 MG tablet Take 2 tablets (300 mg total) by mouth at bedtime.    Vilazodone HCl 20 MG TABS Take 1 tablet (20 mg total) by mouth daily.    Vitamin D, Ergocalciferol, (DRISDOL) 1.25 MG (50000 UNIT) CAPS capsule Take  1 capsule (50,000 Units total) by mouth every 7 (seven) days. Sunday 06/20/2022: Sundays    Facility-Administered Encounter Medications as of 07/18/2022  Medication Note   cyanocobalamin ((VITAMIN B-12)) injection 1,000 mcg 08/08/2019: Not taking    Reviewed chart for medication changes ahead of medication coordination call.  No OVs, Consults, or hospital visits since last care coordination call/Pharmacist visit. (If appropriate, list visit date, provider name)  No medication changes indicated OR if recent visit, treatment plan here.  BP Readings from Last 3 Encounters:  07/07/22 138/64  06/23/22 (!) 143/77  06/13/22 130/62    Lab Results   Component Value Date   HGBA1C 5.0 07/12/2018     Patient obtains medications through Adherence Packaging  90 Days   Last adherence delivery included: (medication name and frequency)  QUEtiapine (SEROQUEL) 50 MG tablet 1 tablet by mouth daily at bedtime Trazodone 150 mg 2 tablet at bedtime  Vitamin D 50,000 1 capsule every Sunday am  Alprazolam 1 mg 1 tablet at bedtime as needed (in vial) Pravastatin '40mg'$  1 tablet daily Omeprazole 40 mg (I tablet daily at bedtime) Levothyroxine (SYNTHROID) 100 MCG tablet 1 tablet by mouth daily 30 min prior to breakfast Amlodipine (NORVASC) 5 MG tablet 1 tablet daily with 2.5 mg Amlodipine 2.5 mg 1 tablet daily with 5 mg  Hydroxyzine '25mg'$  q8h prn Viibryd '20mg'$  take 1 tablet every morning at breakfast    Patient is due for next adherence delivery on: 07/29/22. Called patient and reviewed medications and coordinated delivery.  This delivery to include:  QUEtiapine (SEROQUEL) 50 MG tablet 1 tablet by mouth daily at bedtime Trazodone 150 mg 2 tablet at bedtime  Vitamin D 50,000 1 capsule every Sunday am  Alprazolam 1 mg 1 tablet at bedtime as needed (in vial) Omeprazole 40 mg (I tablet daily at bedtime) Levothyroxine (SYNTHROID) 100 MCG tablet 1 tablet by mouth daily 30 min prior to breakfast Amlodipine (NORVASC) 10 MG tablet 1 tablet daily (new dose) Hydroxyzine '25mg'$  q8h prn Viibryd '20mg'$  take 1 tablet every morning at breakfast  Lamotrigine '100mg'$  2 tablets in am and 1 in pm Methocarbamol '500mg'$  1 tablet every 6 hours as needed  Atorvastatin '40mg'$  1 tablet daily   Patient declined the following medications (meds) due to (reason)   Patient needs refills for 90 days from PCP Levothyroxine (SYNTHROID) 100 MCG tablet 1 tablet by mouth daily 30 min prior to breakfast Pravastatin '40mg'$  1 tablet daily From Specialist QUEtiapine (SEROQUEL) 50 MG tablet 1 tablet by mouth daily at bedtime Trazodone 150 mg 2 tablet at bedtime  Viibryd '20mg'$  take 1 tablet  every morning at breakfast    Confirmed delivery date of 07/29/22, advised patient that pharmacy will contact them the morning of delivery.    Care Gaps   AWV: unknown  Colonoscopy: unknown  DM Eye Exam: N/A DM Foot Exam: N/A Microalbumin: N/A HbgAIC: N/A DEXA: done 09/17/15 Mammogram: done 03/25/21     Star Rating Drugs: Pravastatin (PRAVACHOL) 40 MG tablet - last filled  04/27/22 90 days    Future Appointments  Date Time Provider [place]  08/05/2022  4:30 PM [facility] 3 [facility]  08/11/2022  3:30 PM Elwanda [place], NP [facility] None  08/15/2022  4:00 PM Wendie Agreste, MD [facility] [place], [facility] Clinical Pharmacist Assistant  [phone]

## 2022-07-22 ENCOUNTER — Other Ambulatory Visit: Payer: Self-pay | Admitting: Behavioral Health

## 2022-07-22 DIAGNOSIS — F411 Generalized anxiety disorder: Secondary | ICD-10-CM

## 2022-07-22 DIAGNOSIS — F319 Bipolar disorder, unspecified: Secondary | ICD-10-CM

## 2022-07-22 DIAGNOSIS — F32A Depression, unspecified: Secondary | ICD-10-CM

## 2022-07-25 ENCOUNTER — Ambulatory Visit: Payer: PPO | Admitting: Family Medicine

## 2022-07-25 ENCOUNTER — Other Ambulatory Visit: Payer: Self-pay | Admitting: Behavioral Health

## 2022-07-25 ENCOUNTER — Other Ambulatory Visit: Payer: Self-pay | Admitting: Family Medicine

## 2022-07-25 DIAGNOSIS — F411 Generalized anxiety disorder: Secondary | ICD-10-CM

## 2022-07-25 DIAGNOSIS — F3162 Bipolar disorder, current episode mixed, moderate: Secondary | ICD-10-CM

## 2022-07-25 DIAGNOSIS — F5105 Insomnia due to other mental disorder: Secondary | ICD-10-CM

## 2022-07-25 DIAGNOSIS — F32A Depression, unspecified: Secondary | ICD-10-CM

## 2022-07-25 DIAGNOSIS — E890 Postprocedural hypothyroidism: Secondary | ICD-10-CM

## 2022-07-26 NOTE — Telephone Encounter (Addendum)
[facility] called they are closed Thursday and need Rx for pt prepacked meds for 90 days to be able to deliver Friday. Need Rx for Trazodone and Quetiapine Apt 12/7

## 2022-08-05 ENCOUNTER — Ambulatory Visit: Payer: PPO

## 2022-08-11 ENCOUNTER — Encounter: Payer: Self-pay | Admitting: Behavioral Health

## 2022-08-11 ENCOUNTER — Ambulatory Visit (INDEPENDENT_AMBULATORY_CARE_PROVIDER_SITE_OTHER): Payer: PPO | Admitting: Behavioral Health

## 2022-08-11 DIAGNOSIS — F411 Generalized anxiety disorder: Secondary | ICD-10-CM

## 2022-08-11 DIAGNOSIS — F3162 Bipolar disorder, current episode mixed, moderate: Secondary | ICD-10-CM | POA: Diagnosis not present

## 2022-08-11 DIAGNOSIS — R5383 Other fatigue: Secondary | ICD-10-CM

## 2022-08-11 DIAGNOSIS — F32A Depression, unspecified: Secondary | ICD-10-CM

## 2022-08-11 MED ORDER — HYDROXYZINE HCL 25 MG PO TABS: 25.0000 mg | ORAL_TABLET | Freq: Three times a day (TID) | ORAL | 5 refills | Status: DC | PRN

## 2022-08-11 MED ORDER — DEXTROAMPHETAMINE SULFATE 10 MG PO TABS: 10.0000 mg | ORAL_TABLET | Freq: Every morning | ORAL | 0 refills | Status: DC

## 2022-08-11 MED ORDER — LAMOTRIGINE 100 MG PO TABS: ORAL_TABLET | ORAL | 0 refills | Status: DC

## 2022-08-11 NOTE — Progress Notes (Signed)
[facility]  Patient ID: Becky Lawson,  MRN: [id number]  PCP: Wendie Agreste, MD  Date of Evaluation: 08/11/2022 Time spent:30 minutes  Chief Complaint:  Chief Complaint   Anxiety; Depression; Follow-up; Medication Problem; Medication Refill; Patient Education     HISTORY/CURRENT STATUS: HPI  Becky Lawson presents for follow-up and medication management. No manic episodes since last visit. she is still recovery from hip replacement surgery. She is ambulating without walker. Fine tremors in both hand were improved today, barely noticeable. Unlike last visit she does not want to continue Viibryd anymore. Says she is not sure its working and it is expensive for her. She wants to try getting along without it. She is requesting no other medication changes this visit. And is requesting 3 mo, follow up. Says her anxiety today is 0/10 and depression is 3/10. She was sleeping  7 hours per night with aid of medication but last night slept 0. She denies mania, no psychosis, NO SI/HI.   Past medications for mental health diagnoses include: Lithium, Seroquel, Effexor, Prozac, Latuda, Abilify, Dexedrine, Cymbalta caused weight gain.  Modafinil.   Individual Medical History/ Review of Systems: Changes? :No   Allergies: Patient has no known allergies.  Current Medications:  Current Outpatient Medications:    acetaminophen (TYLENOL) 500 MG tablet, Take 2 tablets (1,000 mg total) by mouth every 6 (six) hours., Disp: 30 tablet, Rfl: 0   ALPRAZolam (XANAX) 1 MG tablet, TAKE 1/2 TO 1 TABLET BY MOUTH twice daily AS NEEDED FOR ANXIETY (Patient taking differently: Take 1 mg by mouth at bedtime.), Disp: 45 tablet, Rfl: 3   amLODipine (NORVASC) 10 MG tablet, Take 1 tablet (10 mg total) by mouth daily., Disp: 90 tablet, Rfl: 0   atorvastatin (LIPITOR) 40 MG tablet, Take 1 tablet (40 mg total) by mouth daily. (Patient taking differently: Take 40 mg by mouth at bedtime.), Disp: 90 tablet, Rfl:  1   azelastine (ASTELIN) 0.1 % nasal spray, Place 1-2 sprays into both nostrils 2 (two) times daily. Use in each nostril as directed (Patient taking differently: Place 1-2 sprays into both nostrils 2 (two) times daily as needed for rhinitis or allergies. Use in each nostril as directed), Disp: 30 mL, Rfl: 3   dextroamphetamine (DEXTROSTAT) 10 MG tablet, Take 1 tablet (10 mg total) by mouth every morning., Disp: 30 tablet, Rfl: 0   docusate sodium (COLACE) 100 MG capsule, Take 1 capsule (100 mg total) by mouth 2 (two) times daily., Disp: 10 capsule, Rfl: 0   HYDROmorphone (DILAUDID) 4 MG tablet, Take 4 mg by mouth 3 (three) times daily as needed for severe pain., Disp: , Rfl:    hydrOXYzine (ATARAX) 25 MG tablet, Take 1 tablet (25 mg total) by mouth every 8 (eight) hours as needed., Disp: 60 tablet, Rfl: 5   lamoTRIgine (LAMICTAL) 100 MG tablet, TAKE TWO TABLETS BY MOUTH EVERY MORNING and TAKE ONE TABLET BY MOUTH EVERY EVENING, Disp: 270 tablet, Rfl: 0   levothyroxine (SYNTHROID) 100 MCG tablet, TAKE ONE TABLET BY MOUTH BEFORE breakfast, Disp: 90 tablet, Rfl: 1   naloxone (NARCAN) nasal spray 4 mg/0.1 mL, Spray into nostril with signs of oversedation/overdose, Disp: 1 each, Rfl: 0   omeprazole (PRILOSEC) 40 MG capsule, Take 1 capsule (40 mg total) by mouth 2 (two) times daily. (Patient taking differently: Take 40 mg by mouth at bedtime.), Disp: 180 capsule, Rfl: 1   polyethylene glycol (MIRALAX / GLYCOLAX) 17 g packet, Take 17 g by mouth daily  as needed for mild constipation., Disp: 14 each, Rfl: 0   QUEtiapine (SEROQUEL) 50 MG tablet, TAKE ONE TABLET BY MOUTH ONCE DAILY, Disp: 90 tablet, Rfl: 0   traZODone (DESYREL) 150 MG tablet, TAKE TWO TABLETS BY MOUTH EVERYDAY AT BEDTIME, Disp: 180 tablet, Rfl: 0   Vitamin D, Ergocalciferol, (DRISDOL) 1.25 MG (50000 UNIT) CAPS capsule, Take 1 capsule (50,000 Units total) by mouth every 7 (seven) days. Sunday, Disp: 12 capsule, Rfl: 3  Current  Facility-Administered Medications:    cyanocobalamin ((VITAMIN B-12)) injection 1,000 mcg, 1,000 mcg, Intramuscular, Q30 days, Shawnee Knapp, MD, 1,000 mcg at 07/12/18 1652 Medication Side Effects: none  Family Medical/ Social History: Changes? No  MENTAL HEALTH EXAM:  There were no vitals taken for this visit.There is no height or weight on file to calculate BMI.  General Appearance: Casual, Neat, and Well Groomed  Eye Contact:  Good  Speech:  Clear and Coherent  Volume:  Normal  Mood:  NA  Affect:  Appropriate  Thought Process:  Coherent  Orientation:  Full (Time, Place, and Person)  Thought Content: Logical   Suicidal Thoughts:  No  Homicidal Thoughts:  No  Memory:  WNL  Judgement:  Good  Insight:  Good  Psychomotor Activity:  Normal  Concentration:  Concentration: Good  Recall:  Good  Fund of Knowledge: Good  Language: Good  Assets:  Desire for Improvement  ADL's:  Intact  Cognition: WNL  Prognosis:  Good    DIAGNOSES:    ICD-10-CM   1. Bipolar 1 disorder, mixed, moderate (HCC)  F31.62 lamoTRIgine (LAMICTAL) 100 MG tablet    2. Melancholy  F32.A dextroamphetamine (DEXTROSTAT) 10 MG tablet    3. Fatigue, unspecified type  R53.83 dextroamphetamine (DEXTROSTAT) 10 MG tablet    4. Generalized anxiety disorder  F41.1 hydrOXYzine (ATARAX) 25 MG tablet      Receiving Psychotherapy: No    RECOMMENDATIONS:   Greater than 50% of 30 min of face to face time  was spent on counseling and coordination of care. Last visit she reported she liked Viibryd and it was help with depression. This visit she is saying that she does not think the medication is helping and would like to stop the medication. I tried to encourage her for dosage increase before giving up and reminded her of her report last time. She did not want to continue the med.    She is scheduled for hip replacement surgery on 10/31 and will follow up 1 week after surgery to reassess.    -Conducted AIMS  Score of 1   today.     Continue Seroquel 50 mg daily at bedtime Continue hydroxyzine 25 mg nightly 8H as needed. Continue Lamictal  300 mg daily. 200 mg in the am and 100 mg in the evening. To reduced Viibryd 20 mg  by 50% each week until stopping.  Continue the  trazodone 300 mg, 1.5 pills nightly. Continue Mirtazapine 7.5 mg at bedtime. May increase to 15 mg if needed.  Continue Xanax 1 mg at bedtime but if taking Return in 5 weeks to reassess.  Discussed potential benefits, risks, and side effects of stimulants with patient to include increased heart rate, palpitations, insomnia, increased anxiety, increased irritability, or decreased appetite.  Instructed patient to contact office if experiencing any significant tolerability issues.  Discussed potential metabolic side effects associated with atypical antipsychotics, as well as potential risk for movement side effects. Advised pt to contact office if movement side effects occur.  PDMP was reviewed     Elwanda Brooklyn, NP          Elwanda Brooklyn, NP

## 2022-08-15 ENCOUNTER — Encounter: Payer: Self-pay | Admitting: Family Medicine

## 2022-08-15 ENCOUNTER — Ambulatory Visit (INDEPENDENT_AMBULATORY_CARE_PROVIDER_SITE_OTHER): Payer: PPO | Admitting: Family Medicine

## 2022-08-15 VITALS — BP 138/72 | HR 94 | Temp 99.0°F | Ht 65.5 in | Wt 192.2 lb

## 2022-08-15 DIAGNOSIS — E890 Postprocedural hypothyroidism: Secondary | ICD-10-CM | POA: Diagnosis not present

## 2022-08-15 DIAGNOSIS — Z22322 Carrier or suspected carrier of Methicillin resistant Staphylococcus aureus: Secondary | ICD-10-CM | POA: Diagnosis not present

## 2022-08-15 DIAGNOSIS — K219 Gastro-esophageal reflux disease without esophagitis: Secondary | ICD-10-CM | POA: Diagnosis not present

## 2022-08-15 DIAGNOSIS — E785 Hyperlipidemia, unspecified: Secondary | ICD-10-CM

## 2022-08-15 DIAGNOSIS — R0989 Other specified symptoms and signs involving the circulatory and respiratory systems: Secondary | ICD-10-CM

## 2022-08-15 DIAGNOSIS — E559 Vitamin D deficiency, unspecified: Secondary | ICD-10-CM | POA: Diagnosis not present

## 2022-08-15 DIAGNOSIS — H6691 Otitis media, unspecified, right ear: Secondary | ICD-10-CM | POA: Diagnosis not present

## 2022-08-15 DIAGNOSIS — R7303 Prediabetes: Secondary | ICD-10-CM | POA: Diagnosis not present

## 2022-08-15 DIAGNOSIS — I10 Essential (primary) hypertension: Secondary | ICD-10-CM

## 2022-08-15 MED ORDER — AMLODIPINE BESYLATE 10 MG PO TABS: 10.0000 mg | ORAL_TABLET | Freq: Every day | ORAL | 0 refills | Status: DC

## 2022-08-15 MED ORDER — LEVOTHYROXINE SODIUM 100 MCG PO TABS: ORAL_TABLET | ORAL | 1 refills | Status: DC

## 2022-08-15 MED ORDER — MUPIROCIN 2 % EX OINT: 1.0000 | TOPICAL_OINTMENT | Freq: Two times a day (BID) | CUTANEOUS | 0 refills | Status: DC

## 2022-08-15 MED ORDER — AZITHROMYCIN 250 MG PO TABS: ORAL_TABLET | ORAL | 0 refills | Status: AC

## 2022-08-15 MED ORDER — ATORVASTATIN CALCIUM 40 MG PO TABS: 40.0000 mg | ORAL_TABLET | Freq: Every day | ORAL | 1 refills | Status: DC

## 2022-08-15 MED ORDER — OMEPRAZOLE 40 MG PO CPDR: 40.0000 mg | DELAYED_RELEASE_CAPSULE | Freq: Every day | ORAL | 3 refills | Status: DC

## 2022-08-15 NOTE — Patient Instructions (Addendum)
Follow up in next 2 weeks with your blood pressure meter to decide on changes. No changes today. Labs next visit - fasting labs (6 hours ideally).  Try astelin nasal spray for a week and different steroid spray like nasacort. That should help throat clearing and postnasal drip.  Mupirocin ointment on fingertip 2 times per day to irritated areas in nostril, for 1 week.

## 2022-08-15 NOTE — Progress Notes (Unsigned)
Subjective:  Patient ID: Becky Lawson, female    DOB: 1951-05-08  Age: 71 y.o. MRN: [id number]  CC:  Chief Complaint  Patient presents with   Hypertension    Pt states its still up and down    infection    Pt has an infection in her nose she wants you to look at, pt states she has constant throat clearing and thinks the infection in her nose could be the cause, pt states it has been 5 weeks that she was testing for a staph infection in her nose    Depression    PHQ9 - 10    HPI Becky Lawson presents for   Hypertension: Norvasc 5 mg daily. Home readings: usually higher - up to 165/80-90. Did not bring in meter today.   BP Readings from Last 3 Encounters:  08/15/22 138/72  07/07/22 138/64  06/23/22 (!) 143/77   Lab Results  Component Value Date   CREATININE 0.72 07/06/2022   GERD: Stable with PPI omeprazole QD.   Last vitamin D Lab Results  Component Value Date   VD25OH 48.84 01/24/2022  Vitamin D deficiency treated with 50,000 units/week.   Nasal irritation Diagnosed with staph infection at time of hip surgery.  Throat clearing discussed in the past, thought to be component of postnasal drip.  Minimal change with Flonase previously.  Recommended Astelin nasal spray, prescribed in June. MRSA positive on staph cx on 10/19. Raw/irritated at times - right greater than left.  Only used astelin for a few days. Recent cold/congestion last week, no fever. Neg covid test. Some improvement. Right ear pain initially, improving.   Hyperlipidemia: Lipitor 40 mg daily. Last ate 3 hrs ago. No new side effects.  Lab Results  Component Value Date   CHOL 196 01/24/2022   HDL 54.40 01/24/2022   LDLCALC 119 (H) 01/24/2022   TRIG 113.0 01/24/2022   CHOLHDL 4 01/24/2022   Lab Results  Component Value Date   ALT 8 01/24/2022   AST 14 01/24/2022   ALKPHOS 88 01/24/2022   BILITOT 0.5 01/24/2022    Hypothyroidism: Lab Results  Component Value Date   TSH 0.76 01/24/2022   Taking medication daily. Synthroid 188mg qd.  No new hot or cold intolerance. No new hair or skin changes, heart palpitations or new fatigue. No new weight changes.   Positive PHQ, followed by psychiatry.no SI.     08/15/2022    4:03 PM 06/13/2022    3:43 PM 01/24/2022    2:34 PM 01/10/2022    2:50 PM 12/16/2021    4:01 PM  Depression screen PHQ 2/9  Decreased Interest '2 2 2 '$ 0 0  Down, Depressed, Hopeless '2 2 2 '$ 0 0  PHQ - 2 Score '4 4 4 '$ 0 0  Altered sleeping 2 0 3 0   Tired, decreased energy '3 3 3 '$ 0   Change in appetite 0 0 0 0   Feeling bad or failure about yourself  1 1 0 0   Trouble concentrating 0 0 0 0   Moving slowly or fidgety/restless 0 0 0 0   Suicidal thoughts 0 0 0 0   PHQ-9 Score '10 8 10 '$ 0   Difficult doing work/chores    Not difficult at all     History Patient Active Problem List   Diagnosis Date Noted   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/05/2022   Status post total replacement of right hip 07/05/2022   S/P total left hip arthroplasty  08/03/2021   Abnormal liver function tests 04/19/2021   Degeneration of lumbar intervertebral disc 05/25/2020   Pain of left hip joint 03/26/2020   Constipation 05/10/2019   Change in bowel habit 05/10/2019   Dysphagia 05/10/2019   Tremor 07/12/2018   Prediabetes 07/12/2018   Polypharmacy 07/12/2018   Unintentional weight loss of more than 10 pounds 07/12/2018   OSA (obstructive sleep apnea) 06/02/2018   Fatty liver 04/18/2017   GERD (gastroesophageal reflux disease) 10/28/2016   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   Chronic throat clearing 06/24/2016   S/P partial thyroidectomy 06/16/2016   Multinodular goiter 06/02/2016   Thyroid nodule 02/08/2016   Hx of hepatitis C 05/19/2015   Vitamin D deficiency 05/19/2015   Spinal stenosis of lumbar region 05/19/2015   Hyperlipidemia 05/19/2015   Bipolar disorder ([place]) 05/19/2015   Essential hypertension 09/26/2013   Undiagnosed cardiac murmurs 09/26/2013   Past Medical  History:  Diagnosis Date   Arthritis    Benign cyst of right kidney 2014   2.7 cm right kidney cyst seen on imaging 2014 but was c/o right flank pain with new persistent proteinuria (fortunately hematuria had resolved) so repeat US 11/2016 showed right kidney still with 2.6 cm simple cyst and otherwise nml.   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 treatment in 2014, genotype 1b, followed at [facility]   Hyperlipidemia    Hypertension    Hypothyroidism    Jaundice 11/01/2012   Pruritic disorder 02/13/2013   Sleep apnea    Was diagnosed approximately 20 years ago, but does not wear CPAP patient stated "I gave it back.Marland KitchenMarland KitchenI couldn't wear that thing it was horrible"   Spinal stenosis    Thyroid disease    Phreesia 06/15/2020   Past Surgical History:  Procedure Laterality Date   ABDOMINAL HYSTERECTOMY     COLONOSCOPY W/ POLYPECTOMY     JOINT REPLACEMENT Bilateral    knee   KNEE ARTHROSCOPY Bilateral    PARTIAL HYSTERECTOMY     THYROID LOBECTOMY Left 06/16/2016   THYROID LOBECTOMY Left 06/16/2016   Procedure: LEFT THYROID LOBECTOMY;  Surgeon: Greer Pickerel, MD;  Location: [facility];  Service: General;  Laterality: Left;   TOTAL HIP ARTHROPLASTY Left 08/03/2021   Procedure: TOTAL HIP ARTHROPLASTY ANTERIOR APPROACH;  Surgeon: Paralee Cancel, MD;  Location: [facility];  Service: Orthopedics;  Laterality: Left;   TOTAL HIP ARTHROPLASTY Right 07/05/2022   Procedure: TOTAL HIP ARTHROPLASTY ANTERIOR APPROACH;  Surgeon: Paralee Cancel, MD;  Location: [facility];  Service: Orthopedics;  Laterality: Right;   No Known Allergies Prior to Admission medications   Medication Sig Start Date End Date Taking? Authorizing Provider  ALPRAZolam (XANAX) 1 MG tablet TAKE 1/2 TO 1 TABLET BY MOUTH twice daily AS NEEDED FOR  ANXIETY Patient taking differently: Take 1 mg by mouth at bedtime. 05/23/22  Yes Lesle Chris A, NP  amLODipine (NORVASC) 10 MG tablet Take 1 tablet (10 mg total) by mouth daily. 05/19/22  Yes Wendie Agreste, MD  atorvastatin (LIPITOR) 40 MG tablet Take 1 tablet (40 mg total) by mouth daily. Patient taking differently: Take 40 mg by mouth at bedtime. 05/24/22  Yes Wendie Agreste, MD  dextroamphetamine (DEXTROSTAT) 10 MG tablet Take 1 tablet (10 mg  total) by mouth every morning. 08/11/22 09/10/22 Yes White, Aaron Edelman A, NP  HYDROmorphone (DILAUDID) 4 MG tablet Take 4 mg by mouth 3 (three) times daily as needed for severe pain.   Yes [provider]  hydrOXYzine (ATARAX) 25 MG tablet Take 1 tablet (25 mg total) by mouth every 8 (eight) hours as needed. 08/11/22  Yes White, Louanna Raw, NP  lamoTRIgine (LAMICTAL) 100 MG tablet TAKE TWO TABLETS BY MOUTH EVERY MORNING and TAKE ONE TABLET BY MOUTH EVERY EVENING 08/11/22  Yes Lesle Chris A, NP  levothyroxine (SYNTHROID) 100 MCG tablet TAKE ONE TABLET BY MOUTH BEFORE breakfast 07/25/22  Yes Wendie Agreste, MD  omeprazole (PRILOSEC) 40 MG capsule Take 1 capsule (40 mg total) by mouth 2 (two) times daily. Patient taking differently: Take 40 mg by mouth at bedtime. 09/23/21  Yes Wendie Agreste, MD  polyethylene glycol (MIRALAX / GLYCOLAX) 17 g packet Take 17 g by mouth daily as needed for mild constipation. 08/04/21  Yes Costella Hatcher R, PA-C  QUEtiapine (SEROQUEL) 50 MG tablet TAKE ONE TABLET BY MOUTH ONCE DAILY 07/27/22  Yes Lesle Chris A, NP  traZODone (DESYREL) 150 MG tablet TAKE TWO TABLETS BY MOUTH EVERYDAY AT BEDTIME 07/27/22  Yes White, Brian A, NP  Vitamin D, Ergocalciferol, (DRISDOL) 1.25 MG (50000 UNIT) CAPS capsule Take 1 capsule (50,000 Units total) by mouth every 7 (seven) days. Sunday 12/28/21  Yes Wendie Agreste, MD  acetaminophen (TYLENOL) 500 MG tablet Take 2 tablets (1,000 mg total) by mouth every 6 (six) hours. Patient not taking:  Reported on 08/15/2022 07/07/22   Irving Copas, PA-C  azelastine (ASTELIN) 0.1 % nasal spray Place 1-2 sprays into both nostrils 2 (two) times daily. Use in each nostril as directed Patient not taking: Reported on 08/15/2022 02/09/22   Wendie Agreste, MD  docusate sodium (COLACE) 100 MG capsule Take 1 capsule (100 mg total) by mouth 2 (two) times daily. Patient not taking: Reported on 08/15/2022 08/04/21   Irving Copas, PA-C  naloxone [facility]) nasal spray 4 mg/0.1 mL Spray into nostril with signs of oversedation/overdose Patient not taking: Reported on 08/15/2022 07/07/22   Irving Copas, PA-C   Social History   Socioeconomic History   Marital status: Divorced    Spouse name: Not on file   Number of children: Not on file   Years of education: Not on file   Highest education level: Not on file  Occupational History   Occupation: Personal Caregiver  Tobacco Use   Smoking status: Former    Packs/day: 1.00    Years: 15.00    Total pack years: 15.00    Types: Cigarettes    Quit date: 06/30/1979    Years since quitting: 43.1   Smokeless tobacco: Never  Vaping Use   Vaping Use: Never used  Substance and Sexual Activity   Alcohol use: Not Currently    Alcohol/week: 0.0 - 1.0 standard drinks of alcohol   Drug use: No   Sexual activity: Yes    Birth control/protection: None  Other Topics Concern   Not on file  Social History Narrative   Divorced. Education: [employer]. Exercise: No.   Left handed   Lives alone   Caffeine: 1/4-1/2 cup/day   Social Determinants of Health   Financial Resource Strain: Low Risk  (12/16/2021)   Overall Financial Resource Strain (CARDIA)    Difficulty of Paying Living Expenses: Not hard at all  Food Insecurity: No Food Insecurity (07/05/2022)   Hunger  Vital Sign    Worried About Charity fundraiser in the Last Year: Never true    Ran Out of Food in the Last Year: Never true  Transportation Needs: No Transportation Needs (07/05/2022)    PRAPARE - Hydrologist (Medical): No    Lack of Transportation (Non-Medical): No  Physical Activity: Insufficiently Active (12/16/2021)   Exercise Vital Sign    Days of Exercise per Week: 2 days    Minutes of Exercise per Session: 60 min  Stress: No Stress Concern Present (12/16/2021)   [place]    Feeling of Stress : Not at all  Social Connections: Moderately Isolated (12/16/2021)   Social Connection and Isolation Panel [NHANES]    Frequency of Communication with Friends and Family: Twice a week    Frequency of Social Gatherings with Friends and Family: Twice a week    Attends Religious Services: More than 4 times per year    Active Member of [employer] or Organizations: No    Attends Archivist Meetings: Never    Marital Status: Divorced  Human resources officer Violence: Not At Risk (07/05/2022)   Humiliation, Afraid, Rape, and Kick questionnaire    Fear of Current or Ex-Partner: No    Emotionally Abused: No    Physically Abused: No    Sexually Abused: No    Review of Systems Per HPI  Objective:   Vitals:   08/15/22 1605  BP: 138/72  Pulse: 94  Temp: 99 F (37.2 C)  SpO2: 97%  Weight: 192 lb 3.2 oz (87.2 kg)  Height: 5' 5.5" (1.664 m)     Physical Exam Vitals reviewed.  Constitutional:      Appearance: Normal appearance. She is well-developed.  HENT:     Head: Normocephalic and atraumatic.     Ears:     Comments: Right TM injected with discolored effusion.  Slight retraction.  Canal clear.  Pinna nontender.    Nose:     Comments:  Minimal irritated appearance of distal septum on left versus right, no wounds or discharge otherwise. Eyes:     Conjunctiva/sclera: Conjunctivae normal.     Pupils: Pupils are equal, round, and reactive to light.  Neck:     Vascular: No carotid bruit.  Cardiovascular:     Rate and Rhythm: Normal rate and regular rhythm.     Heart sounds:  Normal heart sounds.  Pulmonary:     Effort: Pulmonary effort is normal.     Breath sounds: Normal breath sounds.  Abdominal:     Palpations: Abdomen is soft. There is no pulsatile mass.     Tenderness: There is no abdominal tenderness.  Musculoskeletal:     Right lower leg: No edema.     Left lower leg: No edema.  Skin:    General: Skin is warm and dry.  Neurological:     Mental Status: She is alert and oriented to person, place, and time.  Psychiatric:        Mood and Affect: Mood normal.        Behavior: Behavior normal.      Assessment & Plan:  Becky Lawson is a 71 y.o. female . Right otitis media, unspecified otitis media type - Plan: azithromycin (ZITHROMAX) 250 MG tablet  -post URI as above, start Zpak, possible med side effects and risks discussed.  RTC precautions.  Essential hypertension - Plan: amLODipine (NORVASC) 10 MG tablet  -Borderline  control, follow-up visit next 2 weeks with home meter to assess for changes.  Hyperlipidemia, unspecified hyperlipidemia type - Plan: atorvastatin (LIPITOR) 40 MG tablet, Comprehensive metabolic panel, Lipid panel  -plan on fasting labs at next visit, tolerating current dose Lipitor, continue same.  Postoperative hypothyroidism - Plan: levothyroxine (SYNTHROID) 100 MCG tablet, Post-surgical hypothyroidism - Plan: levothyroxine (SYNTHROID) 100 MCG tablet, TSH  -  Stable, tolerating current regimen. Medications refilled. Labs pending as above.   Chronic throat clearing  -Trial of different steroid inhaler as well as consistent Astelin use initially with RTC precautions.  Could consider repeat ENT eval or different ENT eval if persistent.  Continue PPI.  Prediabetes  -Check labs as above.  Continue to watch diet.  Gastroesophageal reflux disease, unspecified whether esophagitis present - Plan: omeprazole (PRILOSEC) 40 MG capsule   -Stable with omeprazole, continue same.  Vitamin D deficiency - Plan: Vitamin D (25 hydroxy)  -On  supplement as above, check updated labs.  MRSA nasal colonization - Plan: mupirocin ointment (BACTROBAN) 2 %  -Irritation but with recent MRSA on culture will try mupirocin topical twice daily for 1 week for eradication.  RTC precautions.  Meds ordered this encounter  Medications   amLODipine (NORVASC) 10 MG tablet    Sig: Take 1 tablet (10 mg total) by mouth daily.    Dispense:  90 tablet    Refill:  0   atorvastatin (LIPITOR) 40 MG tablet    Sig: Take 1 tablet (40 mg total) by mouth daily.    Dispense:  90 tablet    Refill:  1    Disregard prior '10mg'$  dose. Should be '40mg'$ .   levothyroxine (SYNTHROID) 100 MCG tablet    Sig: TAKE ONE TABLET BY MOUTH BEFORE breakfast    Dispense:  90 tablet    Refill:  1   omeprazole (PRILOSEC) 40 MG capsule    Sig: Take 1 capsule (40 mg total) by mouth at bedtime.    Dispense:  90 capsule    Refill:  3   azithromycin (ZITHROMAX) 250 MG tablet    Sig: Take 2 tablets on day 1, then 1 tablet daily on days 2 through 5    Dispense:  6 tablet    Refill:  0   mupirocin ointment (BACTROBAN) 2 %    Sig: Apply 1 Application topically 2 (two) times daily. For 1 week, nasal    Dispense:  22 g    Refill:  0   Patient Instructions  Follow up in next 2 weeks with your blood pressure meter to decide on changes. No changes today. Labs next visit - fasting labs (6 hours ideally).  Try astelin nasal spray for a week and different steroid spray like nasacort. That should help throat clearing and postnasal drip.  Mupirocin ointment on fingertip 2 times per day to irritated areas in nostril, for 1 week.      Signed,   Merri Ray, MD [facility] 08/15/22 4:33 PM

## 2022-08-22 ENCOUNTER — Other Ambulatory Visit: Payer: Self-pay | Admitting: Behavioral Health

## 2022-08-22 DIAGNOSIS — F411 Generalized anxiety disorder: Secondary | ICD-10-CM

## 2022-08-22 DIAGNOSIS — F32A Depression, unspecified: Secondary | ICD-10-CM

## 2022-08-22 DIAGNOSIS — F319 Bipolar disorder, unspecified: Secondary | ICD-10-CM

## 2022-08-24 DIAGNOSIS — Z96641 Presence of right artificial hip joint: Secondary | ICD-10-CM | POA: Diagnosis not present

## 2022-08-24 DIAGNOSIS — Z471 Aftercare following joint replacement surgery: Secondary | ICD-10-CM | POA: Diagnosis not present

## 2022-08-24 DIAGNOSIS — Z96642 Presence of left artificial hip joint: Secondary | ICD-10-CM | POA: Diagnosis not present

## 2022-08-31 DIAGNOSIS — Z79891 Long term (current) use of opiate analgesic: Secondary | ICD-10-CM | POA: Diagnosis not present

## 2022-08-31 DIAGNOSIS — M5136 Other intervertebral disc degeneration, lumbar region: Secondary | ICD-10-CM | POA: Diagnosis not present

## 2022-09-14 ENCOUNTER — Encounter: Payer: Self-pay | Admitting: Family Medicine

## 2022-09-14 ENCOUNTER — Ambulatory Visit (INDEPENDENT_AMBULATORY_CARE_PROVIDER_SITE_OTHER): Payer: PPO | Admitting: Family Medicine

## 2022-09-14 VITALS — BP 132/60 | HR 103 | Temp 98.0°F | Ht 65.5 in | Wt 190.8 lb

## 2022-09-14 DIAGNOSIS — G4710 Hypersomnia, unspecified: Secondary | ICD-10-CM

## 2022-09-14 DIAGNOSIS — R0989 Other specified symptoms and signs involving the circulatory and respiratory systems: Secondary | ICD-10-CM

## 2022-09-14 DIAGNOSIS — I10 Essential (primary) hypertension: Secondary | ICD-10-CM | POA: Diagnosis not present

## 2022-09-14 DIAGNOSIS — G4733 Obstructive sleep apnea (adult) (pediatric): Secondary | ICD-10-CM | POA: Diagnosis not present

## 2022-09-14 NOTE — Patient Instructions (Addendum)
Fasting labs tomorrow.  [facility] in 8:30-4:30 during weekdays, no appointment needed [place].  [place], [place] [id number]  I will refer you to sleep specialist.   Blood pressure ok here today.   You can try nasacort to see if that works better than flonase, and continue astelin for throat clearing.    Return to the clinic or go to the nearest emergency room if any of your symptoms worsen or new symptoms occur.

## 2022-09-14 NOTE — Progress Notes (Signed)
Subjective:  Patient ID: Becky Lawson, female    DOB: [birth year]  Age: 72 y.o. MRN: [id number]  CC:  Chief Complaint  Patient presents with   Hypertension    Brought meter to compare today    Referral    Pt wanted a sleep study ordered states sleeping too much 13 hours some days    Depression    PHQ-9= 13 today     HPI Becky Lawson presents for  Follow-up, last seen December 11 with multiple concerns addressed at that time. Plan for follow-up on hypertension and throat clearing  Hypertension: Borderline control last visit, continue same regimen with amlodipine with home monitoring and close follow-up recommended.  Labs were ordered for future visit in December, have not been performed - plans to have fasting - not fasting today.  No missed doses. No change in blood pressure off dexedrine (psychiatry has prescribed for fatigue).  Home readings:142/89, 158/89, 153/96, 154/85 - feels nervous when squeezing arm. BP meter in office today - readings higher than in office machine.  BP Readings from Last 3 Encounters:  09/14/22 132/60  08/15/22 138/72  07/07/22 138/64   Lab Results  Component Value Date   CREATININE 0.72 07/06/2022   Chronic throat clearing Various treatments been tried previously, and has seen ENT previously.  We changed to a different steroid nasal spray last visit as well as recommended consistent Astelin use with option of different ENT eval if persistent.  Initially was continued on proton pump inhibitor for possible upper airway contributor. Still some PND. Better after use of mupirocin in nose.  Astelin NS once per day. Getting better. Some congestion after URI in ear, slow to improve.   Hypersomnolence History of bipolar disorder, treated by psychiatry.  Appointment with mental health provider on December 7. New antidepressant in December for persistent depression. Minimal improvement with new med. History of obstructive sleep apnea - diagnosed years ago -  unable to tolerate.   History of somnolence.  I referred her to sleep specialist previously, repeat referral sent in February of last year.  I do see an appointment with Dr. Rexene Alberts in August 2023, but hand tremor was discussed at that time. She did not go to appointment with sleep specialist. Agrees to see specialist now.     09/14/2022    2:30 PM 08/15/2022    4:03 PM 06/13/2022    3:43 PM 01/24/2022    2:34 PM 01/10/2022    2:50 PM  Depression screen PHQ 2/9  Decreased Interest '3 2 2 2 '$ 0  Down, Depressed, Hopeless '2 2 2 2 '$ 0  PHQ - 2 Score '5 4 4 4 '$ 0  Altered sleeping 3 2 0 3 0  Tired, decreased energy '3 3 3 3 '$ 0  Change in appetite 0 0 0 0 0  Feeling bad or failure about yourself  '2 1 1 '$ 0 0  Trouble concentrating 0 0 0 0 0  Moving slowly or fidgety/restless 0 0 0 0 0  Suicidal thoughts 0 0 0 0 0  PHQ-9 Score '13 10 8 10 '$ 0  Difficult doing work/chores     Not difficult at all     History Patient Active Problem List   Diagnosis Date Noted   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/05/2022   Status post total replacement of right hip 07/05/2022   S/P total left hip arthroplasty 08/03/2021   Abnormal liver function tests 04/19/2021   Degeneration of lumbar intervertebral disc 05/25/2020  Pain of left hip joint 03/26/2020   Constipation 05/10/2019   Change in bowel habit 05/10/2019   Dysphagia 05/10/2019   Tremor 07/12/2018   Prediabetes 07/12/2018   Polypharmacy 07/12/2018   Unintentional weight loss of more than 10 pounds 07/12/2018   OSA (obstructive sleep apnea) 06/02/2018   Fatty liver 04/18/2017   GERD (gastroesophageal reflux disease) 10/28/2016   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   Chronic throat clearing 06/24/2016   S/P partial thyroidectomy 06/16/2016   Multinodular goiter 06/02/2016   Thyroid nodule 02/08/2016   Hx of hepatitis C 05/19/2015   Vitamin D deficiency 05/19/2015   Spinal stenosis of lumbar region 05/19/2015   Hyperlipidemia 05/19/2015   Bipolar  disorder ([place]) 05/19/2015   Essential hypertension 09/26/2013   Undiagnosed cardiac murmurs 09/26/2013   Past Medical History:  Diagnosis Date   Arthritis    Benign cyst of right kidney 2014   2.7 cm right kidney cyst seen on imaging 2014 but was c/o right flank pain with new persistent proteinuria (fortunately hematuria had resolved) so repeat US 11/2016 showed right kidney still with 2.6 cm simple cyst and otherwise nml.   Bipolar 1 disorder ([place])    Cancer ([place])    Phreesia 06/15/2020   Chronic kidney disease    Depression    Depression    Phreesia 06/15/2020   GERD (gastroesophageal reflux disease)    Heart murmur    Hepatitis C    resolved completely after treatment in 2014, genotype 1b, [facility]   Hyperlipidemia    Hypertension    Hypothyroidism    Jaundice 11/01/2012   Pruritic disorder 02/13/2013   Sleep apnea    Was diagnosed approximately 20 years ago, but does not wear CPAP patient stated "I gave it back.Marland KitchenMarland KitchenI couldn't wear that thing it was horrible"   Spinal stenosis    Thyroid disease    Phreesia 06/15/2020   Past Surgical History:  Procedure Laterality Date   ABDOMINAL HYSTERECTOMY     COLONOSCOPY W/ POLYPECTOMY     JOINT REPLACEMENT Bilateral    knee   KNEE ARTHROSCOPY Bilateral    PARTIAL HYSTERECTOMY     THYROID LOBECTOMY Left 06/16/2016   THYROID LOBECTOMY Left 06/16/2016   Procedure: LEFT THYROID LOBECTOMY;  Surgeon: Greer Pickerel, MD;  Location: [facility];  Service: General;  Laterality: Left;   TOTAL HIP ARTHROPLASTY Left 08/03/2021   Procedure: TOTAL HIP ARTHROPLASTY ANTERIOR APPROACH;  Surgeon: Paralee Cancel, MD;  Location: [facility];  Service: Orthopedics;  Laterality: Left;   TOTAL HIP ARTHROPLASTY Right 07/05/2022   Procedure: TOTAL HIP ARTHROPLASTY ANTERIOR APPROACH;  Surgeon: Paralee Cancel, MD;  Location: [facility];  Service: Orthopedics;  Laterality: Right;   No Known Allergies Prior to Admission medications   Medication Sig Start Date End Date  Taking? Authorizing Provider  acetaminophen (TYLENOL) 500 MG tablet Take 2 tablets (1,000 mg total) by mouth every 6 (six) hours. 07/07/22  Yes Irving Copas, PA-C  ALPRAZolam (XANAX) 1 MG tablet TAKE 1/2 TO 1 TABLET BY MOUTH twice daily AS NEEDED FOR ANXIETY Patient taking differently: Take 1 mg by mouth at bedtime. 05/23/22  Yes Lesle Chris A, NP  amLODipine (NORVASC) 10 MG tablet Take 1 tablet (10 mg total) by mouth daily. 08/15/22  Yes Wendie Agreste, MD  atorvastatin (LIPITOR) 40 MG tablet Take 1 tablet (40 mg total) by mouth daily. 08/15/22  Yes Wendie Agreste, MD  HYDROmorphone (DILAUDID) 4 MG tablet Take 4 mg by mouth 3 (three) times  daily as needed for severe pain.   Yes [provider]  hydrOXYzine (ATARAX) 25 MG tablet Take 1 tablet (25 mg total) by mouth every 8 (eight) hours as needed. 08/11/22  Yes White, Louanna Raw, NP  lamoTRIgine (LAMICTAL) 100 MG tablet TAKE TWO TABLETS BY MOUTH EVERY MORNING and TAKE ONE TABLET BY MOUTH EVERY EVENING 08/11/22  Yes Elwanda Brooklyn, NP  levothyroxine (SYNTHROID) 100 MCG tablet TAKE ONE TABLET BY MOUTH BEFORE breakfast 08/15/22  Yes Wendie Agreste, MD  mupirocin ointment (BACTROBAN) 2 % Apply 1 Application topically 2 (two) times daily. For 1 week, nasal 08/15/22  Yes Wendie Agreste, MD  omeprazole (PRILOSEC) 40 MG capsule Take 1 capsule (40 mg total) by mouth at bedtime. 08/15/22  Yes Wendie Agreste, MD  polyethylene glycol (MIRALAX / GLYCOLAX) 17 g packet Take 17 g by mouth daily as needed for mild constipation. 08/04/21  Yes Costella Hatcher R, PA-C  QUEtiapine (SEROQUEL) 50 MG tablet TAKE ONE TABLET BY MOUTH ONCE DAILY 07/27/22  Yes Lesle Chris A, NP  traZODone (DESYREL) 150 MG tablet TAKE TWO TABLETS BY MOUTH EVERYDAY AT BEDTIME 07/27/22  Yes White, Brian A, NP  Vitamin D, Ergocalciferol, (DRISDOL) 1.25 MG (50000 UNIT) CAPS capsule Take 1 capsule (50,000 Units total) by mouth every 7 (seven) days. Sunday 12/28/21  Yes Wendie Agreste, MD  azelastine (ASTELIN) 0.1 % nasal spray Place 1-2 sprays into both nostrils 2 (two) times daily. Use in each nostril as directed Patient not taking: Reported on 08/15/2022 02/09/22   Wendie Agreste, MD  dextroamphetamine (DEXTROSTAT) 10 MG tablet Take 1 tablet (10 mg total) by mouth every morning. 08/11/22 09/10/22  Elwanda Brooklyn, NP  docusate sodium (COLACE) 100 MG capsule Take 1 capsule (100 mg total) by mouth 2 (two) times daily. Patient not taking: Reported on 09/14/2022 08/04/21   Irving Copas, PA-C   Social History   Socioeconomic History   Marital status: Divorced    Spouse name: Not on file   Number of children: Not on file   Years of education: Not on file   Highest education level: Not on file  Occupational History   Occupation: Personal Caregiver  Tobacco Use   Smoking status: Former    Packs/day: 1.00    Years: 15.00    Total pack years: 15.00    Types: Cigarettes    Quit date: 06/30/1979    Years since quitting: 43.2   Smokeless tobacco: Never  Vaping Use   Vaping Use: Never used  Substance and Sexual Activity   Alcohol use: Not Currently    Alcohol/week: 0.0 - 1.0 standard drinks of alcohol   Drug use: No   Sexual activity: Yes    Birth control/protection: None  Other Topics Concern   Not on file  Social History Narrative   Divorced. Education: [employer]. Exercise: No.   Left handed   Lives alone   Caffeine: 1/4-1/2 cup/day   Social Determinants of Health   Financial Resource Strain: Low Risk  (12/16/2021)   Overall Financial Resource Strain (CARDIA)    Difficulty of Paying Living Expenses: Not hard at all  Food Insecurity: No Food Insecurity (07/05/2022)   Hunger Vital Sign    Worried About Running Out of Food in the Last Year: Never true    Ran Out of Food in the Last Year: Never true  Transportation Needs: No Transportation Needs (07/05/2022)   PRAPARE - Hydrologist (Medical):  No    Lack of  Transportation (Non-Medical): No  Physical Activity: Insufficiently Active (12/16/2021)   Exercise Vital Sign    Days of Exercise per Week: 2 days    Minutes of Exercise per Session: 60 min  Stress: No Stress Concern Present (12/16/2021)   [place]    Feeling of Stress : Not at all  Social Connections: Moderately Isolated (12/16/2021)   Social Connection and Isolation Panel [NHANES]    Frequency of Communication with Friends and Family: Twice a week    Frequency of Social Gatherings with Friends and Family: Twice a week    Attends Religious Services: More than 4 times per year    Active Member of [employer] or Organizations: No    Attends Archivist Meetings: Never    Marital Status: Divorced  Human resources officer Violence: Not At Risk (07/05/2022)   Humiliation, Afraid, Rape, and Kick questionnaire    Fear of Current or Ex-Partner: No    Emotionally Abused: No    Physically Abused: No    Sexually Abused: No    Review of Systems Per HPI  Objective:   Vitals:   09/14/22 1431  BP: 132/60  Pulse: (!) 103  Temp: 98 F (36.7 C)  TempSrc: Temporal  SpO2: 96%  Weight: 190 lb 12.8 oz (86.5 kg)  Height: 5' 5.5" (1.664 m)     Physical Exam Vitals reviewed.  Constitutional:      General: She is not in acute distress.    Appearance: She is well-developed.  HENT:     Head: Normocephalic and atraumatic.     Right Ear: Hearing, tympanic membrane, ear canal and external ear normal.     Left Ear: Hearing, tympanic membrane, ear canal and external ear normal.     Ears:     Comments: No TM erythema, or apparent effusion.     Nose: Nose normal. No congestion or rhinorrhea.     Comments: No lesions/raw areas.     Mouth/Throat:     Pharynx: No posterior oropharyngeal erythema.  Eyes:     Conjunctiva/sclera: Conjunctivae normal.     Pupils: Pupils are equal, round, and reactive to light.  Cardiovascular:     Rate  and Rhythm: Normal rate and regular rhythm.     Heart sounds: Normal heart sounds. No murmur heard. Pulmonary:     Effort: Pulmonary effort is normal. No respiratory distress.     Breath sounds: Normal breath sounds. No wheezing or rhonchi.  Skin:    General: Skin is warm and dry.     Findings: No rash.  Neurological:     Mental Status: She is alert and oriented to person, place, and time.  Psychiatric:        Mood and Affect: Mood normal.        Behavior: Behavior normal.    Assessment & Plan:  ISIDORA LAHAM is a 72 y.o. female . Essential hypertension  -Home meter appears to be running high, overall stable in office, no med changes for now.  -Due for labs ordered last visit, plans for fasting labs tomorrow.  Chronic throat clearing  -Improved, continue Astelin nasal spray, option to add Nasacort.  May also help some of the ear ear symptoms from recent viral illness with RTC precautions if that persists or worsens.  RTC precautions.  OSA (obstructive sleep apnea) - Plan: Ambulatory referral to Sleep Studies Hypersomnolence - Plan: Ambulatory referral to Sleep Studies  -  Hypersomnolence may be multifactorial including underlying depression symptoms with bipolar disorder, side effects from medications, and untreated sleep apnea.  Would like to meet with sleep specialist again to decide on treatments, intolerant to CPAP previously but may be able to try that again.  Referral placed.   No orders of the defined types were placed in this encounter.  Patient Instructions  Fasting labs tomorrow.  [facility] in 8:30-4:30 during weekdays, no appointment needed [place].  [place], [place] [address]  I will refer you to sleep specialist.   Blood pressure ok here today.   You can try nasacort to see if that works better than flonase, and continue astelin for throat clearing.    Return to the clinic or go to the nearest emergency room if any of your symptoms worsen or new  symptoms occur.     Signed,   Merri Ray, MD [facility] 09/14/22 3:29 PM

## 2022-09-15 ENCOUNTER — Other Ambulatory Visit: Payer: Self-pay | Admitting: Family Medicine

## 2022-09-15 ENCOUNTER — Other Ambulatory Visit (INDEPENDENT_AMBULATORY_CARE_PROVIDER_SITE_OTHER): Payer: PPO

## 2022-09-15 DIAGNOSIS — E785 Hyperlipidemia, unspecified: Secondary | ICD-10-CM | POA: Diagnosis not present

## 2022-09-15 DIAGNOSIS — E890 Postprocedural hypothyroidism: Secondary | ICD-10-CM | POA: Diagnosis not present

## 2022-09-15 DIAGNOSIS — I10 Essential (primary) hypertension: Secondary | ICD-10-CM

## 2022-09-15 DIAGNOSIS — R7303 Prediabetes: Secondary | ICD-10-CM | POA: Diagnosis not present

## 2022-09-15 DIAGNOSIS — Z01818 Encounter for other preprocedural examination: Secondary | ICD-10-CM

## 2022-09-15 DIAGNOSIS — R011 Cardiac murmur, unspecified: Secondary | ICD-10-CM

## 2022-09-15 DIAGNOSIS — Z1231 Encounter for screening mammogram for malignant neoplasm of breast: Secondary | ICD-10-CM

## 2022-09-15 DIAGNOSIS — E559 Vitamin D deficiency, unspecified: Secondary | ICD-10-CM | POA: Diagnosis not present

## 2022-09-15 LAB — COMPREHENSIVE METABOLIC PANEL
ALT: 8 U/L (ref 0–35)
AST: 16 U/L (ref 0–37)
Albumin: 4.3 g/dL (ref 3.5–5.2)
Alkaline Phosphatase: 90 U/L (ref 39–117)
BUN: 12 mg/dL (ref 6–23)
CO2: 28 mEq/L (ref 19–32)
Calcium: 9 mg/dL (ref 8.4–10.5)
Chloride: 105 mEq/L (ref 96–112)
Creatinine, Ser: 0.87 mg/dL (ref 0.40–1.20)
GFR: 66.88 mL/min (ref >60.00)
Glucose, Bld: 106 mg/dL — ABNORMAL HIGH (ref 70–99)
Potassium: 4.6 mEq/L (ref 3.5–5.1)
Sodium: 141 mEq/L (ref 135–145)
Total Bilirubin: 0.3 mg/dL (ref 0.2–1.2)
Total Protein: 6.5 g/dL (ref 6.0–8.3)

## 2022-09-15 LAB — LIPID PANEL
Cholesterol: 154 mg/dL (ref 0–200)
HDL: 49.8 mg/dL (ref >39.00)
LDL Cholesterol: 80 mg/dL (ref 0–99)
NonHDL: 104.49
Total CHOL/HDL Ratio: 3
Triglycerides: 123 mg/dL (ref 0.0–149.0)
VLDL: 24.6 mg/dL (ref 0.0–40.0)

## 2022-09-15 LAB — CBC WITH DIFFERENTIAL/PLATELET
Basophils Absolute: 0 10*3/uL (ref 0.0–0.1)
Basophils Relative: 0.4 % (ref 0.0–3.0)
Eosinophils Absolute: 0.1 10*3/uL (ref 0.0–0.7)
Eosinophils Relative: 1.2 % (ref 0.0–5.0)
HCT: 35.8 % — ABNORMAL LOW (ref 36.0–46.0)
Hemoglobin: 11.8 g/dL — ABNORMAL LOW (ref 12.0–15.0)
Lymphocytes Relative: 25.5 % (ref 12.0–46.0)
Lymphs Abs: 1.3 10*3/uL (ref 0.7–4.0)
MCHC: 33 g/dL (ref 30.0–36.0)
MCV: 86 fl (ref 78.0–100.0)
Monocytes Absolute: 0.5 10*3/uL (ref 0.1–1.0)
Monocytes Relative: 9.5 % (ref 3.0–12.0)
Neutro Abs: 3.2 10*3/uL (ref 1.4–7.7)
Neutrophils Relative %: 63.4 % (ref 43.0–77.0)
Platelets: 288 10*3/uL (ref 150.0–400.0)
RBC: 4.16 Mil/uL (ref 3.87–5.11)
RDW: 14.4 % (ref 11.5–15.5)
WBC: 5.1 10*3/uL (ref 4.0–10.5)

## 2022-09-15 LAB — HEMOGLOBIN A1C: Hgb A1c MFr Bld: 5.5 % (ref 4.6–6.5)

## 2022-09-15 LAB — TSH: TSH: 1.37 u[IU]/mL (ref 0.35–5.50)

## 2022-09-15 LAB — VITAMIN D 25 HYDROXY (VIT D DEFICIENCY, FRACTURES): VITD: 36.49 ng/mL (ref 30.00–100.00)

## 2022-09-22 ENCOUNTER — Telehealth: Payer: Self-pay | Admitting: Behavioral Health

## 2022-09-22 NOTE — Telephone Encounter (Signed)
FYI

## 2022-09-22 NOTE — Telephone Encounter (Signed)
Pt called and said that she went back on the viibryd. She wasn't doing well off of it. She just wanted Aaron Edelman to be aware

## 2022-09-28 ENCOUNTER — Ambulatory Visit
Admission: RE | Admit: 2022-09-28 | Discharge: 2022-09-28 | Disposition: A | Discharge status: Home / Self Care | Payer: PPO | Source: Ambulatory Visit | Attending: Family Medicine | Admitting: Family Medicine

## 2022-09-28 DIAGNOSIS — Z1231 Encounter for screening mammogram for malignant neoplasm of breast: Secondary | ICD-10-CM | POA: Diagnosis not present

## 2022-09-29 ENCOUNTER — Telehealth: Payer: Self-pay | Admitting: Behavioral Health

## 2022-09-29 ENCOUNTER — Other Ambulatory Visit: Payer: Self-pay | Admitting: Behavioral Health

## 2022-09-29 DIAGNOSIS — F411 Generalized anxiety disorder: Secondary | ICD-10-CM

## 2022-09-29 DIAGNOSIS — F32A Depression, unspecified: Secondary | ICD-10-CM

## 2022-09-29 DIAGNOSIS — F319 Bipolar disorder, unspecified: Secondary | ICD-10-CM

## 2022-09-29 NOTE — Telephone Encounter (Signed)
Pt called at 3:03p.  She said she had tried to go off the [place]. It's been about a month and she's not doing well.  She would like to go back on it.  Pls send script to [facility].  Next appt 3/7

## 2022-09-30 ENCOUNTER — Ambulatory Visit (INDEPENDENT_AMBULATORY_CARE_PROVIDER_SITE_OTHER): Payer: PPO | Admitting: Nurse Practitioner

## 2022-09-30 VITALS — BP 158/72 | HR 103 | Temp 98.3°F | Ht 65.5 in | Wt 196.2 lb

## 2022-09-30 DIAGNOSIS — N898 Other specified noninflammatory disorders of vagina: Secondary | ICD-10-CM | POA: Diagnosis not present

## 2022-09-30 NOTE — Progress Notes (Signed)
   Established Patient Office Visit  Subjective   Patient ID: Becky Lawson, female    DOB: 1951/08/15  Age: 72 y.o. MRN: [id number]  Chief Complaint  Patient presents with   vulvar lesion    Patient arrives today for acute visit for concerns of multiple masses/lesions to the left side of her vulva and rectal area.  Reports she had a hard bump located in that area about a year ago, it remained stable in size but over the last 2 to 3 weeks she has noticed new masses in the area.  They all remain small, there is no drainage, there is no pain, there is no itching.  Denies being sexually active and denies any new sexual partners.    ROS: see HPI    Objective:     BP (!) 158/72   Pulse (!) 103   Temp 98.3 F (36.8 C) (Temporal)   Ht 5' 5.5" (1.664 m)   Wt 196 lb 4 oz (89 kg)   SpO2 94%   BMI 32.16 kg/m  BP Readings from Last 3 Encounters:  09/30/22 (!) 158/72  09/14/22 132/60  08/15/22 138/72   Wt Readings from Last 3 Encounters:  09/30/22 196 lb 4 oz (89 kg)  09/14/22 190 lb 12.8 oz (86.5 kg)  08/15/22 192 lb 3.2 oz (87.2 kg)      Physical Exam Vitals reviewed. Exam conducted with a chaperone present.  Constitutional:      General: She is not in acute distress.    Appearance: Normal appearance.  HENT:     Head: Normocephalic and atraumatic.  Neck:     Vascular: No carotid bruit.  Cardiovascular:     Rate and Rhythm: Normal rate and regular rhythm.     Pulses: Normal pulses.     Heart sounds: Normal heart sounds.  Pulmonary:     Effort: Pulmonary effort is normal.     Breath sounds: Normal breath sounds.  Genitourinary:   Skin:    General: Skin is warm and dry.  Neurological:     General: No focal deficit present.     Mental Status: She is alert and oriented to person, place, and time.  Psychiatric:        Mood and Affect: Mood normal.        Behavior: Behavior normal.        Judgment: Judgment normal.      No results found for any visits on  09/30/22.    The 10-year ASCVD risk score (Arnett DK, et al., 2019) is: 19.6%    Assessment & Plan:   Problem List Items Addressed This Visit       Genitourinary   Vaginal lesion - Primary    Chronic, does appear to be progressing as new lesions are developing.  Etiology unclear, no obvious signs of infection such as redness/swelling/drainage, does not appear consistent with HSV infection.  Will refer to OB/GYN for further assistance with the evaluation.  Not sure if this could represent malignancy versus genital warts versus other etiology.      Relevant Orders   Ambulatory referral to Obstetrics / Gynecology  Blood pressure slightly elevated today, has been much better at previous visits with PCP.  Recommend she follow-up with PCP as scheduled, or sooner as needed.  Return if symptoms worsen or fail to improve.    Ailene Ards, NP

## 2022-09-30 NOTE — Assessment & Plan Note (Signed)
Chronic, does appear to be progressing as new lesions are developing.  Etiology unclear, no obvious signs of infection such as redness/swelling/drainage, does not appear consistent with HSV infection.  Will refer to OB/GYN for further assistance with the evaluation.  Not sure if this could represent malignancy versus genital warts versus other etiology.

## 2022-10-04 NOTE — Telephone Encounter (Signed)
You took this message. She has a RF request in the inbox.

## 2022-10-14 ENCOUNTER — Other Ambulatory Visit: Payer: Self-pay | Admitting: Family Medicine

## 2022-10-14 DIAGNOSIS — R7989 Other specified abnormal findings of blood chemistry: Secondary | ICD-10-CM

## 2022-10-17 ENCOUNTER — Other Ambulatory Visit: Payer: Self-pay | Admitting: Behavioral Health

## 2022-10-17 DIAGNOSIS — F411 Generalized anxiety disorder: Secondary | ICD-10-CM

## 2022-10-17 DIAGNOSIS — F32A Depression, unspecified: Secondary | ICD-10-CM

## 2022-10-17 DIAGNOSIS — F3162 Bipolar disorder, current episode mixed, moderate: Secondary | ICD-10-CM

## 2022-10-17 DIAGNOSIS — F5105 Insomnia due to other mental disorder: Secondary | ICD-10-CM

## 2022-10-20 ENCOUNTER — Institutional Professional Consult (permissible substitution): Payer: PPO | Admitting: Neurology

## 2022-10-24 ENCOUNTER — Other Ambulatory Visit: Payer: Self-pay | Admitting: Behavioral Health

## 2022-10-24 DIAGNOSIS — F319 Bipolar disorder, unspecified: Secondary | ICD-10-CM

## 2022-10-24 DIAGNOSIS — F32A Depression, unspecified: Secondary | ICD-10-CM

## 2022-10-24 DIAGNOSIS — F411 Generalized anxiety disorder: Secondary | ICD-10-CM

## 2022-10-26 DIAGNOSIS — N9089 Other specified noninflammatory disorders of vulva and perineum: Secondary | ICD-10-CM | POA: Diagnosis not present

## 2022-10-26 DIAGNOSIS — C73 Malignant neoplasm of thyroid gland: Secondary | ICD-10-CM | POA: Insufficient documentation

## 2022-10-29 ENCOUNTER — Encounter: Payer: Self-pay | Admitting: Family Medicine

## 2022-10-29 DIAGNOSIS — R7989 Other specified abnormal findings of blood chemistry: Secondary | ICD-10-CM

## 2022-10-31 MED ORDER — VITAMIN D (ERGOCALCIFEROL) 1.25 MG (50000 UNIT) PO CAPS: 50000.0000 [IU] | ORAL_CAPSULE | ORAL | 3 refills | Status: DC

## 2022-10-31 NOTE — Telephone Encounter (Signed)
Pt is requesting to continue Rx strength vit D please advise as you stated her levels were normal in start of January.

## 2022-11-03 ENCOUNTER — Other Ambulatory Visit: Payer: Self-pay | Admitting: Behavioral Health

## 2022-11-03 DIAGNOSIS — F411 Generalized anxiety disorder: Secondary | ICD-10-CM

## 2022-11-07 ENCOUNTER — Encounter: Payer: Self-pay | Admitting: Neurology

## 2022-11-07 ENCOUNTER — Ambulatory Visit: Payer: PPO | Admitting: Neurology

## 2022-11-07 VITALS — BP 149/72 | HR 89 | Ht 64.0 in | Wt 199.0 lb

## 2022-11-07 DIAGNOSIS — R351 Nocturia: Secondary | ICD-10-CM

## 2022-11-07 DIAGNOSIS — E669 Obesity, unspecified: Secondary | ICD-10-CM

## 2022-11-07 DIAGNOSIS — G4719 Other hypersomnia: Secondary | ICD-10-CM | POA: Diagnosis not present

## 2022-11-07 DIAGNOSIS — G4733 Obstructive sleep apnea (adult) (pediatric): Secondary | ICD-10-CM | POA: Diagnosis not present

## 2022-11-07 NOTE — Patient Instructions (Addendum)
It was nice to see you today. Here is what we discussed:     Based on your symptoms and your exam I believe you may still be at risk for obstructive sleep apnea (aka OSA). We should proceed with a sleep study to determine whether you do or do not have OSA and how severe it is. Even, if you have mild OSA, I may want you to consider treatment with CPAP, as treatment of even borderline or mild sleep apnea can result and improvement of symptoms such as sleep disruption, daytime sleepiness, nighttime bathroom breaks, restless leg symptoms, improvement of headache syndromes, even improved mood disorder.   As explained, an attended sleep study (meaning you get to stay overnight in the sleep lab), lets [place] monitor sleep-related behaviors such as sleep talking and leg movements in sleep, in addition to monitoring for sleep apnea.  A home sleep test is a screening tool for sleep apnea diagnosis only, but unfortunately, does not help with any other sleep-related diagnoses.  Please remember, the long-term risks and ramifications of untreated moderate to severe obstructive sleep apnea may include (but are not limited to): increased risk for cardiovascular disease, including congestive heart failure, stroke, difficult to control hypertension, treatment resistant obesity, arrhythmias, especially irregular heartbeat commonly known as A. Fib. (atrial fibrillation); even type 2 diabetes has been linked to untreated OSA.   Other correlations that untreated obstructive sleep apnea include macular edema which is swelling of the retina in the eyes, droopy eyelid syndrome, and elevated hemoglobin and hematocrit levels (often referred to as polycythemia).  Sleep apnea can cause disruption of sleep and sleep deprivation in most cases, which, in turn, can cause recurrent headaches, problems with memory, mood, concentration, focus, and vigilance. Most people with untreated sleep apnea report excessive daytime sleepiness, which can  affect their ability to drive. Please do not drive or use heavy equipment or machinery, if you feel sleepy! Patients with sleep apnea can also develop difficulty initiating and maintaining sleep (aka insomnia).   Having sleep apnea may increase your risk for other sleep disorders, including involuntary behaviors sleep such as sleep terrors, sleep talking, sleepwalking.    Having sleep apnea can also increase your risk for restless leg syndrome and leg movements at night.   Please note that untreated obstructive sleep apnea may carry additional perioperative morbidity. Patients with significant obstructive sleep apnea (typically, in the moderate to severe degree) should receive, if possible, perioperative PAP (positive airway pressure) therapy and the surgeons and particularly the anesthesiologists should be informed of the diagnosis and the severity of the sleep disordered breathing.   We will call you or email you through [place] with regards to your test results and plan a follow-up in sleep clinic accordingly. Most likely, you will hear from one of our nurses.   Our sleep lab administrative assistant will call you to schedule your sleep study and give you further instructions, regarding the check in process for the sleep study, arrival time, what to bring, when you can expect to leave after the study, etc., and to answer any other logistical questions you may have. If you don't hear back from her by about 2 weeks from now, please feel free to call her direct line at [phone] or you can call our general clinic number, or email [place] through My Chart.

## 2022-11-07 NOTE — Progress Notes (Signed)
Subjective:    Patient ID: Becky Lawson is a 72 y.o. female.  HPI    [name], MD, PhD [facility] [address], [address] [place], [place] [address]  Dear [name],  I saw your patient, Becky Lawson, upon your kind request in my sleep clinic today for initial consultation of her sleep disorder, in particular, evaluation of her prior diagnosis of obstructive sleep apnea.  The patient is unaccompanied today.  I had evaluated her recently for her tremors.  As you know, Ms. [name] is a 72 year old female with an underlying medical history of hypertension, tremors, mood disorder, arthritis, reflux disease, history of hepatitis C, hyperlipidemia, hypothyroidism, chronic kidney disease, and mild obesity, who reports snoring and excessive daytime somnolence.  She was previously diagnosed with obstructive sleep apnea.  Her Epworth sleepiness score is 6 out of 24, fatigue severity score is 47 out of 63.  Prior sleep study results are not available for my review today, testing was about 10 years ago per patient.  She tried PAP therapy but could not tolerate it at the time.  I reviewed your office note from 09/14/2022. She would be willing to get retested and consider treatment again.  She admits to feeling sleepy and sleeping for extended period of time at night, sometimes more than 12 hours.  She does take several psychotropic and potentially sedating medications including Dilaudid as needed, hydroxyzine, Lamictal, Seroquel, trazodone, and Xanax.  Bedtime is generally around 9 or 9:30 PM and rise time around 10 or 11 AM.  She has nocturia about twice per average night, denies recurrent nocturnal or morning headaches.  She has 3 cats in the household, they typically sleep in her bedroom and sometimes on her bed.  She has occasional restless leg symptoms.  She lives alone.  She is not aware of any family history of sleep apnea.  She has gained a little bit of weight in the recent  past, weight has been fluctuating.  She drinks caffeine in the form of coffee, 1 cup in the morning and 1 or 2 soda bottles per day, 16.9 ounce size.  She does not currently drink any alcohol and quit smoking over 40 years ago.  She does not have a TV in her bedroom.  Previously:   04/11/22: 72 year old left-handed woman with an underlying complex medical history of thyroid disease, reflux disease, history of hepatitis C, hypertension, hyperlipidemia, sleep apnea, chronic kidney disease, mood disorder, arthritis, and mild obesity, who reports a longstanding history of hand tremors.  She feels that her tremor has become worse.  It is primarily noticeable when she holds something more with her hands outstretched, also with handwriting.  She still feels that she can write.  I had evaluated her a few years ago for tremors.  She had hand tremors and mild parkinsonism, likely secondary to polypharmacy especially taking antipsychotic medication at the time.  I reviewed your office note from 02/09/2022.  She had blood work in May 2023 and I was able to review results from 01/24/2022: CMP was benign, lipid panel benign with mildly elevated LDL at 119, vitamin D 48.84, TSH normal at 0.76.  Of note, she is on multiple medications including several psychotropic medications.  She is currently on Lamictal, hydroxyzine, Xanax, Remeron, Seroquel, sertraline, trazodone.  Full list of medications below.  I had evaluated her on 07/19/2018, at which time she reported that she was not able to go through with the DaTscan that I  had recommended previously, she reported claustrophobia.  She had tried low-dose Sinemet without obvious improvement in her tremors.  Given her polypharmacy, I did not suggest any new medications for her tremors for fear of side effects. Of note, she is on several medications at this time, including several psychotropic medications.  She is on alprazolam, hydroxyzine, Lamictal, Remeron, Seroquel, sertraline, and  trazodone, full list of medications as below. Of note, she recently stopped taking her sertraline about a month ago, due to weight gain.  She feels restless at times, she has been wondering if she has tardive dyskinesia.  There is no evidence of tardive dyskinesia on exam today.  She has recently switched to psychiatric providers.  She limits her caffeine to half a cup of coffee in the morning and 1 serving of soda per day.  She does not drink any alcohol.  She quit smoking decades ago.  Her father had Parkinson's disease and lived to be 74.  She does not have a family history of tremors.  She had a left total hip replacement and is planning her right total hip replacement.  She has not had any eye examination in over 2 years.    07/19/18: 72 year old left-handed woman with an underlying medical history of reflux disease, mood disorder with a diagnosis of bipolar disorder, abnormal liver function tests, lumbar spinal stenosis, hypothyroidism, obesity, sleep apnea, hypertension, hyperlipidemia, and arthritis, who presents for follow-up consultation of her tremors. The patient is unaccompanied today. I first met her on 02/20/2018 at the request of her primary care physician, at which time she reported a one year history of tremors with a longer standing history of mild her tremors in the past. She had features of essential tremor primarily but also very mild parkinsonism on examination. I had suggested we proceed with a DaT scan, but she could not go through with it. I did not suggest any new medications but did encourage her to talk to her psychiatry provider about medication management. She was on multiple psychotropic medications.   She reports that she could not go through with the scan as she has claustrophobia. Her primary care physician prescribed Sinemet to be taken at bedtime. She recently picked it up. She has not noticed any change in her tremor. She does report mouth dryness. Of note, she continues  to take multiple psychotropic medications. She had a recent sleep study in September which showed mild sleep apnea. She was advised to pursue weight loss. She tries to hydrate well with water. She had some flareup of her back pain recently. She sees Dr. [name] for pain management. She has had no recent falls.   (She) reports a bilateral upper extremity tremor for the past 1 year, progressive, more so on the right. She has noticed changes in her handwriting. She writes with her left hand. She does not have any brothers or sisters. She also reports a family history of Parkinson's disease in her father. I reviewed your office note from 10/21/2017. She was started on Ingrezza for tardive dyskinesias by her psychiatrist. She sees [name] for psychiatry. She has been on [name] for months. She was recently started on Ingrezza, but has not noticed an improvement in her tremors. Her tremor seems to be more on the right side than left. Her father lived to be 49 years old and also had strokes. Her mother lived to be 71 years old and had congestive heart failure, also history of stroke. Patient was seen in this office  several years ago by Dr. Erling Cruz. She had a brain MRI at the time secondary to tremors and frequent falls. She also had a sleep evaluation with Dr. [name] in 2011. She had an EEG in 2009 which was normal in the awake and light sleep stages. She has a long-standing history of bipolar disease. She has been on multiple different medications in the past but does not recall trying Abilify, Geodon, or Haldol. She has been on this but all in the past and has been on lithium but had significant side effects on it. She is divorced for many years, she has 1 son, 1 daughter, neither have tremors. She has no other family history of tremors. She lives alone. She does not smoke and does not drink: A regular basis, drinks caffeine in the form of coffee, one cup per day on average. She takes Xanax for anxiety and also at  night for sleep. She endorses increase in anxiety recently, increase in stress. She also is on Wellbutrin 300 mg strength once daily long-acting. Of note, in reviewing her previous office records, she has had a bilateral hand tremor for over 10 years, was previously labeled with essential tremor.  Her Past Medical History Is Significant For: Past Medical History:  Diagnosis Date   Arthritis    Benign cyst of right kidney 2014   2.7 cm right kidney cyst seen on imaging 2014 but was c/o right flank pain with new persistent proteinuria (fortunately hematuria had resolved) so repeat US 11/2016 showed right kidney still with 2.6 cm simple cyst and otherwise nml.   Bipolar 1 disorder ([place])    Cancer ([place])    Phreesia 06/15/2020   Chronic kidney disease    Depression    Depression    Phreesia 06/15/2020   GERD (gastroesophageal reflux disease)    Heart murmur    Hepatitis C    resolved completely after treatment in 2014, genotype 1b, followed at [facility]   Hyperlipidemia    Hypertension    Hypothyroidism    Jaundice 11/01/2012   Pruritic disorder 02/13/2013   Sleep apnea    Was diagnosed approximately 20 years ago, but does not wear CPAP patient stated "I gave it back.Marland KitchenMarland KitchenI couldn't wear that thing it was horrible"   Spinal stenosis    Thyroid disease    Phreesia 06/15/2020    Her Past Surgical History Is Significant For: Past Surgical History:  Procedure Laterality Date   ABDOMINAL HYSTERECTOMY     COLONOSCOPY W/ POLYPECTOMY     JOINT REPLACEMENT Bilateral    knee   KNEE ARTHROSCOPY Bilateral    PARTIAL HYSTERECTOMY     THYROID LOBECTOMY Left 06/16/2016   THYROID LOBECTOMY Left 06/16/2016   Procedure: LEFT THYROID LOBECTOMY;  Surgeon: Greer Pickerel, MD;  Location: [facility];  Service: General;  Laterality: Left;   TOTAL HIP ARTHROPLASTY Left 08/03/2021   Procedure: TOTAL HIP ARTHROPLASTY ANTERIOR APPROACH;  Surgeon: Paralee Cancel, MD;  Location: [facility];  Service: Orthopedics;  Laterality:  Left;   TOTAL HIP ARTHROPLASTY Right 07/05/2022   Procedure: TOTAL HIP ARTHROPLASTY ANTERIOR APPROACH;  Surgeon: Paralee Cancel, MD;  Location: [facility];  Service: Orthopedics;  Laterality: Right;    Her Family History Is Significant For: Family History  Problem Relation Age of Onset   Diabetes Mother    Heart disease Mother    Hyperlipidemia Mother    Hypertension Mother    Stroke Mother    Hypertension Father    Parkinson's disease Father  Heart disease Father    Hyperlipidemia Father    Hemachromatosis Daughter    Thyroid disease Neg Hx    Colon cancer Neg Hx    Esophageal cancer Neg Hx    Inflammatory bowel disease Neg Hx    Liver disease Neg Hx    Pancreatic cancer Neg Hx    Rectal cancer Neg Hx    Stomach cancer Neg Hx    Sleep apnea Neg Hx     Her Social History Is Significant For: Social History   Socioeconomic History   Marital status: Divorced    Spouse name: Not on file   Number of children: Not on file   Years of education: Not on file   Highest education level: Not on file  Occupational History   Occupation: Personal Caregiver  Tobacco Use   Smoking status: Former    Packs/day: 1.00    Years: 15.00    Total pack years: 15.00    Types: Cigarettes    Quit date: 06/30/1979    Years since quitting: 43.3   Smokeless tobacco: Never  Vaping Use   Vaping Use: Never used  Substance and Sexual Activity   Alcohol use: Not Currently    Alcohol/week: 0.0 - 1.0 standard drinks of alcohol   Drug use: No   Sexual activity: Yes    Birth control/protection: None  Other Topics Concern   Not on file  Social History Narrative   Divorced. Education: [employer]. Exercise: No.   Left handed   Lives alone   Caffeine: 1/4-1/2 cup/day   Social Determinants of Health   Financial Resource Strain: Low Risk  (12/16/2021)   Overall Financial Resource Strain (CARDIA)    Difficulty of Paying Living Expenses: Not hard at all  Food Insecurity: No Food Insecurity (07/05/2022)    Hunger Vital Sign    Worried About Running Out of Food in the Last Year: Never true    Ran Out of Food in the Last Year: Never true  Transportation Needs: No Transportation Needs (07/05/2022)   PRAPARE - Hydrologist (Medical): No    Lack of Transportation (Non-Medical): No  Physical Activity: Insufficiently Active (12/16/2021)   Exercise Vital Sign    Days of Exercise per Week: 2 days    Minutes of Exercise per Session: 60 min  Stress: No Stress Concern Present (12/16/2021)   [place]    Feeling of Stress : Not at all  Social Connections: Moderately Isolated (12/16/2021)   Social Connection and Isolation Panel [NHANES]    Frequency of Communication with Friends and Family: Twice a week    Frequency of Social Gatherings with Friends and Family: Twice a week    Attends Religious Services: More than 4 times per year    Active Member of [employer] or Organizations: No    Attends Archivist Meetings: Never    Marital Status: Divorced    Her Allergies Are:  No Known Allergies:   Her Current Medications Are:  Outpatient Encounter Medications as of 11/07/2022  Medication Sig   acetaminophen (TYLENOL) 500 MG tablet Take 2 tablets (1,000 mg total) by mouth every 6 (six) hours.   ALPRAZolam (XANAX) 1 MG tablet TAKE 1/2 TO 1 TABLET BY MOUTH twice daily AS NEEDED FOR ANXIETY (Patient taking differently: Take 1 mg by mouth at bedtime.)   amLODipine (NORVASC) 10 MG tablet Take 1 tablet (10 mg total) by mouth daily.   atorvastatin (  LIPITOR) 40 MG tablet Take 1 tablet (40 mg total) by mouth daily.   azelastine (ASTELIN) 0.1 % nasal spray Place 1-2 sprays into both nostrils 2 (two) times daily. Use in each nostril as directed   docusate sodium (COLACE) 100 MG capsule Take 1 capsule (100 mg total) by mouth 2 (two) times daily.   HYDROmorphone (DILAUDID) 4 MG tablet Take 4 mg by mouth 3 (three) times  daily as needed for severe pain.   hydrOXYzine (ATARAX) 25 MG tablet TAKE ONE TABLET BY MOUTH every EIGHT hours AS NEEDED   lamoTRIgine (LAMICTAL) 100 MG tablet TAKE TWO TABLETS BY MOUTH EVERY MORNING and TAKE ONE TABLET BY MOUTH EVERY EVENING   levothyroxine (SYNTHROID) 100 MCG tablet TAKE ONE TABLET BY MOUTH BEFORE breakfast   omeprazole (PRILOSEC) 40 MG capsule Take 1 capsule (40 mg total) by mouth at bedtime.   polyethylene glycol (MIRALAX / GLYCOLAX) 17 g packet Take 17 g by mouth daily as needed for mild constipation.   QUEtiapine (SEROQUEL) 50 MG tablet TAKE ONE TABLET BY MOUTH ONCE daily   traZODone (DESYREL) 150 MG tablet TAKE TWO TABLETS BY MOUTH everyday AT bedtime   Vilazodone HCl 20 MG TABS TAKE ONE TABLET BY MOUTH ONCE DAILY   Vitamin D, Ergocalciferol, (DRISDOL) 1.25 MG (50000 UNIT) CAPS capsule Take 1 capsule (50,000 Units total) by mouth every 7 (seven) days. Sunday   dextroamphetamine (DEXTROSTAT) 10 MG tablet Take 1 tablet (10 mg total) by mouth every morning.   mupirocin ointment (BACTROBAN) 2 % Apply 1 Application topically 2 (two) times daily. For 1 week, nasal   Facility-Administered Encounter Medications as of 11/07/2022  Medication   cyanocobalamin ((VITAMIN B-12)) injection 1,000 mcg  :   Review of Systems:  Out of a complete 14 point review of systems, all are reviewed and negative with the exception of these symptoms as listed below:  Review of Systems  Neurological:        Pt here for sleep consult Pt snores,fatigue,hypertension  Pt denies headaches,cpap machine Pt states sleep study 10 years ago    ESS:6 FSS:47    Objective:  Neurological Exam  Physical Exam Physical Examination:   Vitals:   11/07/22 1225  BP: (!) 149/72  Pulse: 89    General Examination: The patient is a very pleasant 72 y.o. female in no acute distress. She appears well-developed and well-nourished and well groomed.   HEENT: Normocephalic, atraumatic, pupils are equal, round  and reactive to light, extraocular tracking well-preserved, minimal facial masking noted.  No nuchal rigidity.  Speech without hypophonia or voice tremor.  No facial tremor.  She has a benign anterior neck scar from partial thyroidectomy.  Airway examination reveals moderate mouth dryness, moderate airway crowding secondary to Mallampati class III, small airway opening.  Tonsils on the smaller side.  Neck circumference 15-1/4 inches, minimal overbite noted.  Tongue protrudes centrally and palate elevates symmetrically.  No carotid bruits.     Chest: Clear to auscultation without wheezing, rhonchi or crackles noted.   Heart: S1+S2+0, regular and normal without murmurs, rubs or gallops noted.    Abdomen: Soft, non-tender and non-distended.   Extremities: There is no pitting edema in the distal lower extremities bilaterally.    Skin: Warm and dry without trophic changes noted.    Musculoskeletal: exam reveals limited range of motion.  She is status post joint replacement surgeries both knees and left hip.     Neurologically:  Mental status: The patient is awake, alert and  oriented in all 4 spheres. Her immediate and remote memory, attention, language skills and fund of knowledge are appropriate. There is no evidence of aphasia, agnosia, apraxia or anomia. Speech is clear with normal prosody and enunciation. Thought process is linear. Mood is normal and affect is normal.  Cranial nerves II - XII are as described above under HEENT exam.  Motor exam: Normal bulk, strength and tone is noted. There is a very slight and intermittent resting tremor in both hands.    (On 04/11/2022 : On Archimedes spiral drawing she has mild trembling with the right, mild insecurity but no trembling with the left, handwriting with the left hand is slightly tremulous, legible, not micrographic.) She has no lower extremity tremor.   Fine motor skills and coordination: Grossly intact.  Cerebellar testing: No dysmetria or  intention tremor. There is no truncal or gait ataxia.  Sensory exam: intact to light touch in the upper and lower extremities.  Gait, station and balance: She stands without difficulty, upper body slightly forward tilted with increase in lumbar kyphosis.  She walks with preserved arm swing, no shuffling.   Assessment and Plan:  In summary, Becky Lawson is a very pleasant 72 y.o.-year old female with an underlying medical history of hypertension, tremors, mood disorder, arthritis, reflux disease, history of hepatitis C, hyperlipidemia, hypothyroidism, chronic kidney disease, and mild obesity, who presents for evaluation of her obstructive sleep apnea (OSA).  She was diagnosed with OSA some 10 years ago but could not tolerate CPAP at the time.  She would be willing to get reevaluated and consider treatment if the need arises.   I had a long chat with the patient about my findings and the diagnosis of sleep apnea, particularly OSA, its prognosis and treatment options. We talked about medical/conservative treatments, surgical interventions and non-pharmacological approaches for symptom control. I explained, in particular, the risks and ramifications of untreated moderate to severe OSA, especially with respect to developing cardiovascular disease down the road, including congestive heart failure (CHF), difficult to treat hypertension, cardiac arrhythmias (particularly A-fib), neurovascular complications including TIA, stroke and dementia. Even type 2 diabetes has, in part, been linked to untreated OSA. Symptoms of untreated OSA may include (but may not be limited to) daytime sleepiness, nocturia (i.e. frequent nighttime urination), memory problems, mood irritability and suboptimally controlled or worsening mood disorder such as depression and/or anxiety, lack of energy, lack of motivation, physical discomfort, as well as recurrent headaches, especially morning or nocturnal headaches. We talked about the importance  of maintaining a healthy lifestyle and striving for healthy weight. In addition, we talked about the importance of striving for and maintaining good sleep hygien. I recommended a sleep study at this time. I outlined the differences between a laboratory attended sleep study which is considered more comprehensive and accurate over the option of a home sleep test (HST); the latter may lead to underestimation of sleep disordered breathing in some instances and does not help with diagnosing upper airway resistance syndrome and is not accurate enough to diagnose primary central sleep apnea typically. I outlined possible surgical and non-surgical treatment options of OSA, including the use of a positive airway pressure (PAP) device (i.e. CPAP, AutoPAP/APAP or BiPAP in certain circumstances), a custom-made dental device (aka oral appliance, which would require a referral to a specialist dentist or orthodontist typically, and is generally speaking not considered for patients with full dentures or edentulous state), upper airway surgical options, such as traditional UPPP (which is not considered a  first-line treatment) or the Inspire device (hypoglossal nerve stimulator, which would involve a referral for consultation with an ENT surgeon, after careful selection, following inclusion criteria - also not first-line treatment). I explained the PAP treatment option to the patient in detail, as this is generally considered first-line treatment.  The patient indicated that she would be willing to try PAP therapy, if the need arises. I explained the importance of being compliant with PAP treatment, not only for insurance purposes but primarily to improve patient's symptoms symptoms, and for the patient's long term health benefit, including to reduce Her cardiovascular risks longer-term.    We will pick up our discussion about the next steps and treatment options after testing.  We will keep her posted as to the test results by  phone call and/or MyChart messaging where possible.  We will plan to follow-up in sleep clinic accordingly as well.  I answered all her questions today and the patient was in agreement.   I encouraged her to call with any interim questions, concerns, problems or updates or email [place] through [place].  Generally speaking, sleep test authorizations may take up to 2 weeks, sometimes less, sometimes longer, the patient is encouraged to get in touch with [place] if they do not hear back from the sleep lab staff directly within the next 2 weeks.  Thank you very much for allowing me to participate in the care of this nice patient. If I can be of any further assistance to you please do not hesitate to call me at [phone].  Sincerely,   [name], MD, PhD  I spent 40 minutes in total face-to-face time and in reviewing records during pre-charting, more than 50% of which was spent in counseling and coordination of care, reviewing test results, reviewing medications and treatment regimen and/or in discussing or reviewing the diagnosis of OSA, the prognosis and treatment options. Pertinent laboratory and imaging test results that were available during this visit with the patient were reviewed by me and considered in my medical decision making (see chart for details).

## 2022-11-10 ENCOUNTER — Ambulatory Visit: Payer: PPO | Admitting: Behavioral Health

## 2022-11-10 ENCOUNTER — Encounter: Payer: Self-pay | Admitting: Behavioral Health

## 2022-11-10 DIAGNOSIS — F5105 Insomnia due to other mental disorder: Secondary | ICD-10-CM | POA: Diagnosis not present

## 2022-11-10 DIAGNOSIS — F99 Mental disorder, not otherwise specified: Secondary | ICD-10-CM | POA: Diagnosis not present

## 2022-11-10 DIAGNOSIS — F3162 Bipolar disorder, current episode mixed, moderate: Secondary | ICD-10-CM

## 2022-11-10 DIAGNOSIS — F3130 Bipolar disorder, current episode depressed, mild or moderate severity, unspecified: Secondary | ICD-10-CM | POA: Diagnosis not present

## 2022-11-10 DIAGNOSIS — F32A Depression, unspecified: Secondary | ICD-10-CM

## 2022-11-10 DIAGNOSIS — F411 Generalized anxiety disorder: Secondary | ICD-10-CM | POA: Diagnosis not present

## 2022-11-10 DIAGNOSIS — F319 Bipolar disorder, unspecified: Secondary | ICD-10-CM

## 2022-11-10 MED ORDER — LAMOTRIGINE 100 MG PO TABS: ORAL_TABLET | ORAL | 1 refills | Status: DC

## 2022-11-10 MED ORDER — VILAZODONE HCL 20 MG PO TABS: 1.0000 | ORAL_TABLET | Freq: Every day | ORAL | 3 refills | Status: DC

## 2022-11-10 MED ORDER — TRAZODONE HCL 150 MG PO TABS: ORAL_TABLET | ORAL | 0 refills | Status: DC

## 2022-11-10 NOTE — Progress Notes (Signed)
[facility]  Patient ID: Becky Lawson,  MRN: [id number]  PCP: Wendie Agreste, MD  Date of Evaluation: 11/10/2022 Time spent:30 minutes  Chief Complaint:  Chief Complaint   Anxiety; Depression; Follow-up; Medication Refill; Patient Education     HISTORY/CURRENT STATUS: HPI Becky Lawson presents for follow-up and medication management. No manic episodes since last visit. she is still recovery from hip replacement surgery. She is ambulating without walker. Fine tremors in both hand were improved today, barely noticeable. Unlike last visit she does not want to continue Viibryd anymore. Says she is not sure its working and it is expensive for her. She wants to try getting along without it. She is requesting no other medication changes this visit. And is requesting 3 mo, follow up. Says her anxiety today is 0/10 and depression is 3/10. She was sleeping  7 hours per night with aid of medication but last night slept 0. She denies mania, no psychosis, NO SI/HI.   Past medications for mental health diagnoses include: Lithium, Seroquel, Effexor, Prozac, Latuda, Abilify, Dexedrine, Cymbalta caused weight gain.  Modafinil.      Individual Medical History/ Review of Systems: Changes? :No   Allergies: Patient has no known allergies.  Current Medications:  Current Outpatient Medications:    acetaminophen (TYLENOL) 500 MG tablet, Take 2 tablets (1,000 mg total) by mouth every 6 (six) hours., Disp: 30 tablet, Rfl: 0   ALPRAZolam (XANAX) 1 MG tablet, TAKE 1/2 TO 1 TABLET BY MOUTH twice daily AS NEEDED FOR ANXIETY (Patient taking differently: Take 1 mg by mouth at bedtime.), Disp: 45 tablet, Rfl: 3   amLODipine (NORVASC) 10 MG tablet, Take 1 tablet (10 mg total) by mouth daily., Disp: 90 tablet, Rfl: 0   atorvastatin (LIPITOR) 40 MG tablet, Take 1 tablet (40 mg total) by mouth daily., Disp: 90 tablet, Rfl: 1   azelastine (ASTELIN) 0.1 % nasal spray, Place 1-2 sprays into both nostrils 2  (two) times daily. Use in each nostril as directed, Disp: 30 mL, Rfl: 3   dextroamphetamine (DEXTROSTAT) 10 MG tablet, Take 1 tablet (10 mg total) by mouth every morning., Disp: 30 tablet, Rfl: 0   docusate sodium (COLACE) 100 MG capsule, Take 1 capsule (100 mg total) by mouth 2 (two) times daily., Disp: 10 capsule, Rfl: 0   HYDROmorphone (DILAUDID) 4 MG tablet, Take 4 mg by mouth 3 (three) times daily as needed for severe pain., Disp: , Rfl:    hydrOXYzine (ATARAX) 25 MG tablet, TAKE ONE TABLET BY MOUTH every EIGHT hours AS NEEDED, Disp: 60 tablet, Rfl: 5   lamoTRIgine (LAMICTAL) 100 MG tablet, TAKE TWO TABLETS BY MOUTH EVERY MORNING and TAKE ONE TABLET BY MOUTH EVERY EVENING, Disp: 270 tablet, Rfl: 1   levothyroxine (SYNTHROID) 100 MCG tablet, TAKE ONE TABLET BY MOUTH BEFORE breakfast, Disp: 90 tablet, Rfl: 1   omeprazole (PRILOSEC) 40 MG capsule, Take 1 capsule (40 mg total) by mouth at bedtime., Disp: 90 capsule, Rfl: 3   polyethylene glycol (MIRALAX / GLYCOLAX) 17 g packet, Take 17 g by mouth daily as needed for mild constipation., Disp: 14 each, Rfl: 0   QUEtiapine (SEROQUEL) 50 MG tablet, TAKE ONE TABLET BY MOUTH ONCE daily, Disp: 90 tablet, Rfl: 0   traZODone (DESYREL) 150 MG tablet, TAKE TWO TABLETS BY MOUTH everyday AT bedtime, Disp: 180 tablet, Rfl: 0   Vilazodone HCl 20 MG TABS, Take 1 tablet (20 mg total) by mouth daily., Disp: 30 tablet, Rfl: 3  Vitamin D, Ergocalciferol, (DRISDOL) 1.25 MG (50000 UNIT) CAPS capsule, Take 1 capsule (50,000 Units total) by mouth every 7 (seven) days. Sunday, Disp: 12 capsule, Rfl: 3  Current Facility-Administered Medications:    cyanocobalamin ((VITAMIN B-12)) injection 1,000 mcg, 1,000 mcg, Intramuscular, Q30 days, Shawnee Knapp, MD, 1,000 mcg at 07/12/18 1652 Medication Side Effects: none  Family Medical/ Social History: Changes? No  MENTAL HEALTH EXAM:  There were no vitals taken for this visit.There is no height or weight on file to calculate  BMI.  General Appearance: Casual, Neat, and Well Groomed  Eye Contact:  Good  Speech:  Clear and Coherent  Volume:  Normal  Mood:  Anxious, Depressed, and Dysphoric  Affect:  Congruent, Depressed, and Flat  Thought Process:  Coherent  Orientation:  Full (Time, Place, and Person)  Thought Content: Logical   Suicidal Thoughts:  No  Homicidal Thoughts:  No  Memory:  WNL  Judgement:  Good  Insight:  Good  Psychomotor Activity:  Normal  Concentration:  Concentration: Good  Recall:  Good  Fund of Knowledge: Good  Language: Good  Assets:  Desire for Improvement  ADL's:  Intact  Cognition: WNL  Prognosis:  Good    DIAGNOSES:    ICD-10-CM   1. Generalized anxiety disorder  F41.1 Vilazodone HCl 20 MG TABS    traZODone (DESYREL) 150 MG tablet    2. Melancholy  F32.A Vilazodone HCl 20 MG TABS    traZODone (DESYREL) 150 MG tablet    3. Bipolar depression (HCC)  F31.9 Vilazodone HCl 20 MG TABS    4. Bipolar 1 disorder, mixed, moderate (HCC)  F31.62 lamoTRIgine (LAMICTAL) 100 MG tablet    5. Insomnia due to other mental disorder  F51.05 traZODone (DESYREL) 150 MG tablet   F99       Receiving Psychotherapy: No    RECOMMENDATIONS:   Greater than 50% of 30 min of face to face time  was spent on counseling and coordination of care. We discussed her current moderate improvement with medication. She request no medication changes this visit. We talked about her increase in exercise. She has membership to [employer] now and will be going to the [facility]. She would like to be more social and have companionship.  -Conducted AIMS  Score of 1  today.   Continue Seroquel 50 mg daily at bedtime Continue hydroxyzine 25 mg nightly 8H as needed. Continue Lamictal  300 mg daily. 200 mg in the am and 100 mg in the evening. To reduced Viibryd 20 mg  by 50% each week until stopping.  Continue the  trazodone 300 mg, 1.5 pills nightly. Continue Mirtazapine 7.5 mg at bedtime. May increase to 15 mg  if needed.  Continue Xanax 1 mg at bedtime but if taking Return in 5 weeks to reassess.  Discussed potential benefits, risks, and side effects of stimulants with patient to include increased heart rate, palpitations, insomnia, increased anxiety, increased irritability, or decreased appetite.  Instructed patient to contact office if experiencing any significant tolerability issues.  Discussed potential metabolic side effects associated with atypical antipsychotics, as well as potential risk for movement side effects. Advised pt to contact office if movement side effects occur.   PDMP was reviewed   Elwanda Brooklyn, NP

## 2022-11-21 ENCOUNTER — Other Ambulatory Visit: Payer: Self-pay | Admitting: Behavioral Health

## 2022-11-21 DIAGNOSIS — F411 Generalized anxiety disorder: Secondary | ICD-10-CM

## 2022-11-21 DIAGNOSIS — F5105 Insomnia due to other mental disorder: Secondary | ICD-10-CM

## 2022-11-21 DIAGNOSIS — F32A Depression, unspecified: Secondary | ICD-10-CM

## 2022-11-24 ENCOUNTER — Telehealth: Payer: Self-pay | Admitting: Neurology

## 2022-11-24 DIAGNOSIS — M5416 Radiculopathy, lumbar region: Secondary | ICD-10-CM | POA: Insufficient documentation

## 2022-11-24 NOTE — Telephone Encounter (Signed)
HTA pending faxed notes 

## 2022-12-05 NOTE — Telephone Encounter (Signed)
Called HTA to check the status it is still pending.

## 2022-12-06 ENCOUNTER — Other Ambulatory Visit: Payer: Self-pay | Admitting: Behavioral Health

## 2022-12-06 DIAGNOSIS — R5383 Other fatigue: Secondary | ICD-10-CM

## 2022-12-06 DIAGNOSIS — F32A Depression, unspecified: Secondary | ICD-10-CM

## 2022-12-14 ENCOUNTER — Ambulatory Visit (INDEPENDENT_AMBULATORY_CARE_PROVIDER_SITE_OTHER): Payer: PPO | Admitting: Family Medicine

## 2022-12-14 ENCOUNTER — Encounter: Payer: Self-pay | Admitting: Family Medicine

## 2022-12-14 DIAGNOSIS — I10 Essential (primary) hypertension: Secondary | ICD-10-CM

## 2022-12-14 MED ORDER — AMLODIPINE BESYLATE 10 MG PO TABS: 10.0000 mg | ORAL_TABLET | Freq: Every day | ORAL | 1 refills | Status: DC

## 2022-12-14 NOTE — Progress Notes (Signed)
Subjective:  Patient ID: Becky Lawson, female    DOB: April 17, 1951  Age: 72 y.o. MRN: [id number]  CC:  Chief Complaint  Patient presents with   Follow-up    Review labs and med check      HPI Becky Lawson presents for   Hypertension: Borderline elevated on prior visits.  Stable today.  History of OSA.  Referred to sleep specialist in January, saw Dr. Frances Furbish 3/4. Planned sleep study - has not had yet.  Norvasc 10mg  qd - no new side effects.  Home readings: none - machine not working.  BP Readings from Last 3 Encounters:  12/14/22 132/70  11/07/22 (!) 149/72  09/30/22 (!) 158/72   Lab Results  Component Value Date   CREATININE 0.87 09/15/2022   Hyperlipidemia: Lipitor 40mg  qd - doing well.  Lab Results  Component Value Date   CHOL 154 09/15/2022   HDL 49.80 09/15/2022   LDLCALC 80 09/15/2022   TRIG 123.0 09/15/2022   CHOLHDL 3 09/15/2022   Lab Results  Component Value Date   ALT 8 09/15/2022   AST 16 09/15/2022   ALKPHOS 90 09/15/2022   BILITOT 0.3 09/15/2022   Hypothyroidism: Lab Results  Component Value Date   TSH 1.37 09/15/2022  Some weight gain.  Synthroid qd.  Increased appetite. New med vilazadone.  Senior exercise class 2 times per week. Some difficulty with exercise with spinal stenosis. Follow up planned with specialist.   Throat clearing has improved. Not needing nasal sprays.   History Patient Active Problem List   Diagnosis Date Noted   Vaginal lesion 09/30/2022   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/05/2022   Status post total replacement of right hip 07/05/2022   S/P total left hip arthroplasty 08/03/2021   Abnormal liver function tests 04/19/2021   Degeneration of lumbar intervertebral disc 05/25/2020   Pain of left hip joint 03/26/2020   Constipation 05/10/2019   Change in bowel habit 05/10/2019   Dysphagia 05/10/2019   Tremor 07/12/2018   Prediabetes 07/12/2018   Polypharmacy 07/12/2018   Unintentional weight loss of more  than 10 pounds 07/12/2018   OSA (obstructive sleep apnea) 06/02/2018   Fatty liver 04/18/2017   GERD (gastroesophageal reflux disease) 10/28/2016   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   Chronic throat clearing 06/24/2016   S/P partial thyroidectomy 06/16/2016   Multinodular goiter 06/02/2016   Thyroid nodule 02/08/2016   Hx of hepatitis C 05/19/2015   Vitamin D deficiency 05/19/2015   Spinal stenosis of lumbar region 05/19/2015   Hyperlipidemia 05/19/2015   Bipolar disorder 05/19/2015   Essential hypertension 09/26/2013   Undiagnosed cardiac murmurs 09/26/2013   Past Medical History:  Diagnosis Date   Arthritis    Benign cyst of right kidney 2014   2.7 cm right kidney cyst seen on imaging 2014 but was c/o right flank pain with new persistent proteinuria (fortunately hematuria had resolved) so repeat US 11/2016 showed right kidney still with 2.6 cm simple cyst and otherwise nml.   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 treatment in 2014, genotype 1b, [facility]   Hyperlipidemia    Hypertension    Hypothyroidism    Jaundice 11/01/2012   Pruritic disorder 02/13/2013   Sleep apnea  Was diagnosed approximately 20 years ago, but does not wear CPAP patient stated "I gave it back.[name].[name].I couldn't wear that thing it was horrible"   Spinal stenosis    Thyroid disease    Phreesia 06/15/2020   Past Surgical History:  Procedure Laterality Date   ABDOMINAL HYSTERECTOMY     COLONOSCOPY W/ POLYPECTOMY     JOINT REPLACEMENT Bilateral    knee   KNEE ARTHROSCOPY Bilateral    PARTIAL HYSTERECTOMY     THYROID LOBECTOMY Left 06/16/2016   THYROID LOBECTOMY Left 06/16/2016   Procedure: LEFT THYROID LOBECTOMY;  Surgeon: Gaynelle AduEric Wilson, MD;  [facility];  Service: General;  Laterality: Left;    TOTAL HIP ARTHROPLASTY Left 08/03/2021   Procedure: TOTAL HIP ARTHROPLASTY ANTERIOR APPROACH;  Surgeon: Durene Romanslin, Matthew, MD;  Location: [facility] ORS;  Service: Orthopedics;  Laterality: Left;   TOTAL HIP ARTHROPLASTY Right 07/05/2022   Procedure: TOTAL HIP ARTHROPLASTY ANTERIOR APPROACH;  Surgeon: Durene Romanslin, Matthew, MD;  Location: [facility];  Service: Orthopedics;  Laterality: Right;   No Known Allergies Prior to Admission medications   Medication Sig Start Date End Date Taking? Authorizing Provider  acetaminophen (TYLENOL) 500 MG tablet Take 2 tablets (1,000 mg total) by mouth every 6 (six) hours. 07/07/22  Yes Cassandria Angeronovan, Ashley R, PA-C  ALPRAZolam Prudy Feeler(XANAX) 1 MG tablet TAKE 1/2 TO 1 TABLET BY MOUTH twice daily AS NEEDED FOR ANXIETY 11/22/22  Yes Avelina LaineWhite, Brian A, NP  amLODipine (NORVASC) 10 MG tablet Take 1 tablet (10 mg total) by mouth daily. 08/15/22  Yes Shade FloodGreene, Murial Beam R, MD  atorvastatin (LIPITOR) 40 MG tablet Take 1 tablet (40 mg total) by mouth daily. 08/15/22  Yes Shade FloodGreene, Clarabell Matsuoka R, MD  azelastine (ASTELIN) 0.1 % nasal spray Place 1-2 sprays into both nostrils 2 (two) times daily. Use in each nostril as directed 02/09/22  Yes Shade FloodGreene, Zarinah Oviatt R, MD  dextroamphetamine (DEXTROSTAT) 10 MG tablet TAKE ONE TABLET BY MOUTH EVERY MORNING 12/07/22  Yes Avelina LaineWhite, Brian A, NP  docusate sodium (COLACE) 100 MG capsule Take 1 capsule (100 mg total) by mouth 2 (two) times daily. 08/04/21  Yes Cassandria Angeronovan, Ashley R, PA-C  HYDROmorphone (DILAUDID) 4 MG tablet Take 4 mg by mouth 3 (three) times daily as needed for severe pain.   Yes [provider]  hydrOXYzine (ATARAX) 25 MG tablet TAKE ONE TABLET BY MOUTH every EIGHT hours AS NEEDED 10/24/22  Yes White, Watt ClimesBrian A, NP  lamoTRIgine (LAMICTAL) 100 MG tablet TAKE TWO TABLETS BY MOUTH EVERY MORNING and TAKE ONE TABLET BY MOUTH EVERY EVENING 11/10/22  Yes Avelina LaineWhite, Brian A, NP  levothyroxine (SYNTHROID) 100 MCG tablet TAKE ONE TABLET BY MOUTH BEFORE breakfast 08/15/22  Yes Shade FloodGreene, Aloria Looper R,  MD  omeprazole (PRILOSEC) 40 MG capsule Take 1 capsule (40 mg total) by mouth at bedtime. 08/15/22  Yes Shade FloodGreene, Said Rueb R, MD  polyethylene glycol (MIRALAX / GLYCOLAX) 17 g packet Take 17 g by mouth daily as needed for mild constipation. 08/04/21  Yes Rosalene Billingsonovan, Ashley R, PA-C  QUEtiapine (SEROQUEL) 50 MG tablet TAKE ONE TABLET BY MOUTH ONCE daily 10/17/22  Yes Avelina LaineWhite, Brian A, NP  traZODone (DESYREL) 150 MG tablet TAKE TWO TABLETS BY MOUTH everyday AT bedtime 11/10/22  Yes White, Arlys JohnBrian A, NP  Vilazodone HCl 20 MG TABS Take 1 tablet (20 mg total) by mouth daily. 11/10/22  Yes White, Watt ClimesBrian A, NP  Vitamin D, Ergocalciferol, (DRISDOL) 1.25 MG (50000 UNIT) CAPS capsule Take 1 capsule (50,000 Units total) by mouth every 7 (seven) days.  Sunday 10/31/22  Yes Shade Flood, MD   Social History   Socioeconomic History   Marital status: Divorced    Spouse name: Not on file   Number of children: Not on file   Years of education: Not on file   Highest education level: Associate degree: academic program  Occupational History   Occupation: Personal Caregiver  Tobacco Use   Smoking status: Former    Packs/day: 1.00    Years: 15.00    Additional pack years: 0.00    Total pack years: 15.00    Types: Cigarettes    Quit date: 06/30/1979    Years since quitting: 43.4   Smokeless tobacco: Never  Vaping Use   Vaping Use: Never used  Substance and Sexual Activity   Alcohol use: Not Currently    Alcohol/week: 0.0 - 1.0 standard drinks of alcohol   Drug use: No   Sexual activity: Yes    Birth control/protection: None  Other Topics Concern   Not on file  Social History Narrative   Divorced. Education: [employer]. Exercise: No.   Left handed   Lives alone   Caffeine: 1/4-1/2 cup/day   Social Determinants of Health   Financial Resource Strain: Medium Risk (12/13/2022)   Overall Financial Resource Strain (CARDIA)    Difficulty of Paying Living Expenses: Somewhat hard  Food Insecurity: No Food Insecurity  (12/13/2022)   Hunger Vital Sign    Worried About Running Out of Food in the Last Year: Never true    Ran Out of Food in the Last Year: Never true  Transportation Needs: No Transportation Needs (12/13/2022)   PRAPARE - Administrator, Civil Service (Medical): No    Lack of Transportation (Non-Medical): No  Physical Activity: Insufficiently Active (12/13/2022)   Exercise Vital Sign    Days of Exercise per Week: 2 days    Minutes of Exercise per Session: 60 min  Stress: No Stress Concern Present (12/13/2022)   [employer] of Occupational Health - Occupational Stress Questionnaire    Feeling of Stress : Not at all  Social Connections: Moderately Integrated (12/13/2022)   Social Connection and Isolation Panel [NHANES]    Frequency of Communication with Friends and Family: More than three times a week    Frequency of Social Gatherings with Friends and Family: Twice a week    Attends Religious Services: More than 4 times per year    Active Member of [employer] or Organizations: Yes    Attends Banker Meetings: More than 4 times per year    Marital Status: Divorced  Intimate Partner Violence: Not At Risk (07/05/2022)   Humiliation, Afraid, Rape, and Kick questionnaire    Fear of Current or Ex-Partner: No    Emotionally Abused: No    Physically Abused: No    Sexually Abused: No    Review of Systems Per HPI.   Objective:   Vitals:   12/14/22 1546  BP: 132/70  Pulse: (!) 103  Temp: 98.6 F (37 C)  TempSrc: Temporal  SpO2: 95%  Weight: 206 lb 12.8 oz (93.8 kg)  Height: 5\' 4"  (1.626 m)     Physical Exam Vitals reviewed.  Constitutional:      Appearance: Normal appearance. She is well-developed.  HENT:     Head: Normocephalic and atraumatic.  Eyes:     Conjunctiva/sclera: Conjunctivae normal.     Pupils: Pupils are equal, round, and reactive to light.  Neck:     Vascular: No carotid bruit.  Cardiovascular:     Rate and Rhythm: Normal rate and regular  rhythm.     Heart sounds: Normal heart sounds.  Pulmonary:     Effort: Pulmonary effort is normal.     Breath sounds: Normal breath sounds.  Abdominal:     Palpations: Abdomen is soft. There is no pulsatile mass.     Tenderness: There is no abdominal tenderness.  Musculoskeletal:     Right lower leg: No edema.     Left lower leg: No edema.  Skin:    General: Skin is warm and dry.  Neurological:     Mental Status: She is alert and oriented to person, place, and time.  Psychiatric:        Mood and Affect: Mood normal.        Behavior: Behavior normal.        Assessment & Plan:  Becky Lawson is a 72 y.o. female . Essential hypertension - Plan: amLODipine (NORVASC) 10 MG tablet  -Appears stable on current regimen.  Plan for follow-up with sleep specialist for sleep study, possible OSA may be contributing to blood pressure.  Plan for labs at next visit, no medication changes for now.  No orders of the defined types were placed in this encounter.  Patient Instructions  Coordinate sleep study with specialist.  Blood pressure looks good today.  Plan on labs next visit. Take care.     Signed,   Meredith Staggers, MD [facility], [facility] 12/14/22 4:24 PM

## 2022-12-14 NOTE — Patient Instructions (Addendum)
Coordinate sleep study with specialist.  Blood pressure looks good today.  Plan on labs next visit. Take care.

## 2022-12-19 ENCOUNTER — Telehealth: Payer: Self-pay | Admitting: Family Medicine

## 2022-12-19 NOTE — Telephone Encounter (Signed)
Contacted Chipper Herb to schedule their annual wellness visit. Appointment made for 12/21/2022.  Thank you,  [facility] Support [facility] Direct dial  [phone]

## 2022-12-20 ENCOUNTER — Ambulatory Visit (INDEPENDENT_AMBULATORY_CARE_PROVIDER_SITE_OTHER): Payer: PPO | Admitting: Neurology

## 2022-12-20 DIAGNOSIS — G4733 Obstructive sleep apnea (adult) (pediatric): Secondary | ICD-10-CM

## 2022-12-20 DIAGNOSIS — R351 Nocturia: Secondary | ICD-10-CM

## 2022-12-20 DIAGNOSIS — G4734 Idiopathic sleep related nonobstructive alveolar hypoventilation: Secondary | ICD-10-CM

## 2022-12-20 DIAGNOSIS — G4720 Circadian rhythm sleep disorder, unspecified type: Secondary | ICD-10-CM

## 2022-12-20 DIAGNOSIS — E669 Obesity, unspecified: Secondary | ICD-10-CM

## 2022-12-20 DIAGNOSIS — G4719 Other hypersomnia: Secondary | ICD-10-CM

## 2022-12-20 NOTE — Telephone Encounter (Signed)
NPSG- HTA Becky Lawson: [id number] (exp. 11/24/22 to 02/22/23)- pt stated if she is the last one to be put done she does not want to be the first one to get up   Patient is scheduled for today 12/20/22 at 9 pm.  She is aware to bring something comfortable to sleep in, to eat her evening meal before arriving and if she takes any medication before bed to bring that with her.  She is aware to be here tonight at 9 pm.  She informed me that she is the last one to be put down tonight she does not want to be the first one to be woke up. She stated the last time she had a SS she was the last one to be put down and the first one to wake up and she did not believe that was far and did not want that to happen again.

## 2022-12-21 ENCOUNTER — Ambulatory Visit (INDEPENDENT_AMBULATORY_CARE_PROVIDER_SITE_OTHER): Payer: PPO | Admitting: *Deleted

## 2022-12-21 DIAGNOSIS — Z0000 Encounter for general adult medical examination without abnormal findings: Secondary | ICD-10-CM | POA: Diagnosis not present

## 2022-12-21 DIAGNOSIS — E2839 Other primary ovarian failure: Secondary | ICD-10-CM | POA: Diagnosis not present

## 2022-12-21 NOTE — Progress Notes (Signed)
Subjective:   Becky Lawson is a 72 y.o. female who presents for Medicare Annual (Subsequent) preventive   I connected with  MAUDE HETTICH on 12/21/22 by a telephone enabled telemedicine application and verified that I am speaking with the correct person using two identifiers.   I discussed the limitations of evaluation and management by telemedicine. The patient expressed understanding and agreed to proceed.  Patient location: home  Provider location: telephone/home    Review of Systems     Cardiac Risk Factors include: advanced age (>65men, >77 women);hypertension;family history of premature cardiovascular disease     Objective:    Today's Vitals   12/21/22 1559  PainSc: 5    There is no height or weight on file to calculate BMI.     12/21/2022    4:04 PM 07/05/2022    6:00 PM 06/23/2022    2:28 PM 12/16/2021    4:01 PM 08/03/2021    6:15 PM 07/21/2021    2:09 PM 01/09/2019    2:48 PM  Advanced Directives  Does Patient Have a Medical Advance Directive? No Yes Yes Yes Yes Yes Yes  Type of Furniture conservator/restorer;Living will  Healthcare Power of [place];Living will Healthcare Power of [place];Living will Healthcare Power of [place];Living will Healthcare Power of Attorney  Does patient want to make changes to medical advance directive?  No - Patient declined No - Patient declined  No - Patient declined  No - Patient declined  Copy of Healthcare Power of Attorney in Chart?    No - copy requested   Yes - validated most recent copy scanned in chart (See row information)  Would patient like information on creating a medical advance directive? No - Patient declined          Current Medications (verified) Outpatient Encounter Medications as of 12/21/2022  Medication Sig   acetaminophen (TYLENOL) 500 MG tablet Take 2 tablets (1,000 mg total) by mouth every 6 (six) hours.   ALPRAZolam (XANAX) 1 MG tablet TAKE 1/2 TO 1 TABLET BY MOUTH twice daily AS  NEEDED FOR ANXIETY   amLODipine (NORVASC) 10 MG tablet Take 1 tablet (10 mg total) by mouth daily.   atorvastatin (LIPITOR) 40 MG tablet Take 1 tablet (40 mg total) by mouth daily.   azelastine (ASTELIN) 0.1 % nasal spray Place 1-2 sprays into both nostrils 2 (two) times daily. Use in each nostril as directed   dextroamphetamine (DEXTROSTAT) 10 MG tablet TAKE ONE TABLET BY MOUTH EVERY MORNING   docusate sodium (COLACE) 100 MG capsule Take 1 capsule (100 mg total) by mouth 2 (two) times daily.   HYDROmorphone (DILAUDID) 4 MG tablet Take 4 mg by mouth 3 (three) times daily as needed for severe pain.   hydrOXYzine (ATARAX) 25 MG tablet TAKE ONE TABLET BY MOUTH every EIGHT hours AS NEEDED   lamoTRIgine (LAMICTAL) 100 MG tablet TAKE TWO TABLETS BY MOUTH EVERY MORNING and TAKE ONE TABLET BY MOUTH EVERY EVENING   levothyroxine (SYNTHROID) 100 MCG tablet TAKE ONE TABLET BY MOUTH BEFORE breakfast   omeprazole (PRILOSEC) 40 MG capsule Take 1 capsule (40 mg total) by mouth at bedtime.   polyethylene glycol (MIRALAX / GLYCOLAX) 17 g packet Take 17 g by mouth daily as needed for mild constipation.   QUEtiapine (SEROQUEL) 50 MG tablet TAKE ONE TABLET BY MOUTH ONCE daily   traZODone (DESYREL) 150 MG tablet TAKE TWO TABLETS BY MOUTH everyday AT bedtime   Vilazodone HCl 20 MG  TABS Take 1 tablet (20 mg total) by mouth daily.   Vitamin D, Ergocalciferol, (DRISDOL) 1.25 MG (50000 UNIT) CAPS capsule Take 1 capsule (50,000 Units total) by mouth every 7 (seven) days. Sunday   Facility-Administered Encounter Medications as of 12/21/2022  Medication   cyanocobalamin ((VITAMIN B-12)) injection 1,000 mcg    Allergies (verified) Patient has no known allergies.   History: Past Medical History:  Diagnosis Date   Arthritis    Benign cyst of right kidney 2014   2.7 cm right kidney cyst seen on imaging 2014 but was c/o right flank pain with new persistent proteinuria (fortunately hematuria had resolved) so repeat US  11/2016 showed right kidney still with 2.6 cm simple cyst and otherwise nml.   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 treatment in 2014, genotype 1b, followed at [facility]   Hyperlipidemia    Hypertension    Hypothyroidism    Jaundice 11/01/2012   Pruritic disorder 02/13/2013   Sleep apnea    Was diagnosed approximately 20 years ago, but does not wear CPAP patient stated "I gave it back.Marland KitchenMarland KitchenI couldn't wear that thing it was horrible"   Spinal stenosis    Thyroid disease    [employer] 06/15/2020   Past Surgical History:  Procedure Laterality Date   ABDOMINAL HYSTERECTOMY     COLONOSCOPY W/ POLYPECTOMY     JOINT REPLACEMENT Bilateral    knee   KNEE ARTHROSCOPY Bilateral    PARTIAL HYSTERECTOMY     THYROID LOBECTOMY Left 06/16/2016   THYROID LOBECTOMY Left 06/16/2016   Procedure: LEFT THYROID LOBECTOMY;  Surgeon: Gaynelle Adu, MD;  [facility];  Service: General;  Laterality: Left;   TOTAL HIP ARTHROPLASTY Left 08/03/2021   Procedure: TOTAL HIP ARTHROPLASTY ANTERIOR APPROACH;  Surgeon: Durene Romans, MD;  Location: [facility] ORS;  Service: Orthopedics;  Laterality: Left;   TOTAL HIP ARTHROPLASTY Right 07/05/2022   Procedure: TOTAL HIP ARTHROPLASTY ANTERIOR APPROACH;  Surgeon: Durene Romans, MD;  Location: [facility] ORS;  Service: Orthopedics;  Laterality: Right;   Family History  Problem Relation Age of Onset   Diabetes Mother    Heart disease Mother    Hyperlipidemia Mother    Hypertension Mother    Stroke Mother    Hypertension Father    Parkinson's disease Father    Heart disease Father    Hyperlipidemia Father    Hemachromatosis Daughter    Thyroid disease Neg Hx    Colon cancer Neg Hx    Esophageal cancer Neg Hx    Inflammatory bowel disease Neg Hx    Liver disease Neg Hx    Pancreatic cancer  Neg Hx    Rectal cancer Neg Hx    Stomach cancer Neg Hx    Sleep apnea Neg Hx    Social History   Socioeconomic History   Marital status: Divorced    Spouse name: Not on file   Number of children: Not on file   Years of education: Not on file   Highest education level: Associate degree: academic program  Occupational History   Occupation: Personal Caregiver  Tobacco Use   Smoking status: Former    Packs/day: 1.00    Years: 15.00    Additional pack years: 0.00  Total pack years: 15.00    Types: Cigarettes    Quit date: 06/30/1979    Years since quitting: 43.5   Smokeless tobacco: Never  Vaping Use   Vaping Use: Never used  Substance and Sexual Activity   Alcohol use: Not Currently    Alcohol/week: 0.0 - 1.0 standard drinks of alcohol   Drug use: No   Sexual activity: Yes    Birth control/protection: None  Other Topics Concern   Not on file  Social History Narrative   Divorced. Education: [employer]. Exercise: No.   Left handed   Lives alone   Caffeine: 1/4-1/2 cup/day   Social Determinants of Health   Financial Resource Strain: Low Risk  (12/21/2022)   Overall Financial Resource Strain (CARDIA)    Difficulty of Paying Living Expenses: Not hard at all  Recent Concern: Financial Resource Strain - Medium Risk (12/13/2022)   Overall Financial Resource Strain (CARDIA)    Difficulty of Paying Living Expenses: Somewhat hard  Food Insecurity: No Food Insecurity (12/21/2022)   Hunger Vital Sign    Worried About Running Out of Food in the Last Year: Never true    Ran Out of Food in the Last Year: Never true  Transportation Needs: No Transportation Needs (12/21/2022)   PRAPARE - Administrator, Civil Service (Medical): No    Lack of Transportation (Non-Medical): No  Physical Activity: Insufficiently Active (12/21/2022)   Exercise Vital Sign    Days of Exercise per Week: 2 days    Minutes of Exercise per Session: 30 min  Stress: No Stress Concern Present  (12/21/2022)   [employer] of Occupational Health - Occupational Stress Questionnaire    Feeling of Stress : Not at all  Social Connections: Moderately Integrated (12/21/2022)   Social Connection and Isolation Panel [NHANES]    Frequency of Communication with Friends and Family: More than three times a week    Frequency of Social Gatherings with Friends and Family: More than three times a week    Attends Religious Services: More than 4 times per year    Active Member of [employer] or Organizations: Yes    Attends Engineer, structural: More than 4 times per year    Marital Status: Divorced    Tobacco Counseling Counseling given: Not Answered   Clinical Intake:  Pre-visit preparation completed: Yes  Pain : 0-10 Pain Score: 5  Pain Type: Chronic pain Pain Location: Back Pain Descriptors / Indicators: Constant, Burning, Aching, Dull, Grimacing Pain Onset: More than a month ago Pain Frequency: Constant Pain Relieving Factors: dilauded  Pain Relieving Factors: dilauded  Nutritional Risks: None Diabetes: No  How often do you need to have someone help you when you read instructions, pamphlets, or other written materials from your doctor or pharmacy?: 1 - Never  Diabetic?  no  Interpreter Needed?: No  Information entered by :: Remi Haggard LPN   Activities of Daily Living    12/21/2022    4:03 PM 07/05/2022    6:00 PM  In your present state of health, do you have any difficulty performing the following activities:  Hearing? 0 0  Vision? 0 0  Difficulty concentrating or making decisions? 0 0  Walking or climbing stairs? 0 1  Dressing or bathing? 0 0  Doing errands, shopping? 0 0  Preparing Food and eating ? N   Using the Toilet? N   In the past six months, have you accidently leaked urine? N   Do you have problems  with loss of bowel control? N   Managing your Medications? N   Managing your Finances? N     Patient Care Team: Shade Flood, MD as PCP -  General (Family Medicine) Anne Fu, PA-C as Physician Assistant (Psychiatry) Brooke Dare, MD as Consulting Physician (Gastroenterology) Yates Decamp, MD as Consulting Physician (Cardiology) Romero Belling, MD (Inactive) as Consulting Physician (Endocrinology) Gaynelle Adu, MD as Consulting Physician (General Surgery) Sheran Luz, MD as Consulting Physician (Physical Medicine and Rehabilitation) Erroll Luna, [facility] (Pharmacist)  Indicate any recent Medical Services you may have received from other than [facility] providers in the past year (date may be approximate).     Assessment:   This is a routine wellness examination for Becky Lawson.  Hearing/Vision screen Hearing Screening - Comments:: No trouble hearing Vision Screening - Comments:: Not up to date cheek  Dietary issues and exercise activities discussed: Current Exercise Habits: Structured exercise class, Type of exercise: strength training/weights;stretching;walking (silver sneakers), Time (Minutes): 30, Frequency (Times/Week): 2, Weekly Exercise (Minutes/Week): 60, Intensity: Mild   Goals Addressed   None    Depression Screen    12/21/2022    4:07 PM 12/14/2022    3:53 PM 09/30/2022    1:36 PM 09/14/2022    2:30 PM 08/15/2022    4:03 PM 06/13/2022    3:43 PM 01/24/2022    2:34 PM  PHQ 2/9 Scores  PHQ - 2 Score 2 2 0 5 4 4 4   PHQ- 9 Score 7 6 0 13 10 8 10     Fall Risk    12/14/2022    3:53 PM 09/30/2022    1:36 PM 09/14/2022    2:26 PM 08/15/2022    4:04 PM 02/09/2022    1:33 PM  Fall Risk   Falls in the past year? 0 0 0 0 0  Number falls in past yr: 0 0 0 0 0  Injury with Fall? 0 0 0 0 0  Risk for fall due to : No Fall Risks Impaired balance/gait No Fall Risks No Fall Risks No Fall Risks  Follow up  Falls evaluation completed Falls evaluation completed Falls evaluation completed Falls evaluation completed    FALL RISK PREVENTION PERTAINING TO THE HOME:  Any stairs in or around the home? No  If so, are there any  without handrails? No  Home free of loose throw rugs in walkways, pet beds, electrical cords, etc? Yes  Adequate lighting in your home to reduce risk of falls? Yes   ASSISTIVE DEVICES UTILIZED TO PREVENT FALLS:  Life alert? No  Use of a cane, walker or w/c? Yes  Grab bars in the bathroom? No  Shower chair or bench in shower? Yes  Elevated toilet seat or a handicapped toilet? Yes   TIMED UP AND GO:  Was the test performed? No .    Cognitive Function:        12/21/2022    4:04 PM 01/09/2019    2:50 PM  6CIT Screen  What Year? 0 points 0 points  What month? 0 points 0 points  What time? 0 points 0 points  Count back from 20 0 points 0 points  Months in reverse 0 points 0 points  Repeat phrase 0 points 0 points  Total Score 0 points 0 points    Immunizations Immunization History  Administered Date(s) Administered   Fluad Quad(high Dose 65+) 05/23/2019, 07/13/2020, 07/19/2021, 06/13/2022   Influenza, High Dose Seasonal PF 04/27/2017, 05/15/2018   Influenza,inj,Quad PF,6+ Mos 05/19/2015, 06/17/2016  PFIZER(Purple Top)SARS-COV-2 Vaccination 11/07/2019, 11/29/2019, 06/26/2020   Pneumococcal Conjugate-13 07/12/2018   Pneumococcal Polysaccharide-23 06/17/2016   Tdap 01/29/2017    TDAP status: Up to date  Flu Vaccine status: Up to date  Pneumococcal vaccine status: Up to date  Covid-19 vaccine status: Information provided on how to obtain vaccines.   Qualifies for Shingles Vaccine? Yes   Zostavax completed No   Shingrix Completed?: No.    Education has been provided regarding the importance of this vaccine. Patient has been advised to call insurance company to determine out of pocket expense if they have not yet received this vaccine. Advised may also receive vaccine at local pharmacy or Health Dept. Verbalized acceptance and understanding.  Screening Tests Health Maintenance  Topic Date Due   Fecal DNA (Cologuard)  01/25/2023 (Originally 03/15/2021)   INFLUENZA  VACCINE  04/06/2023   MAMMOGRAM  09/29/2023   Medicare Annual Wellness (AWV)  12/21/2023   DTaP/Tdap/Td (2 - Td or Tdap) 01/30/2027   Pneumonia Vaccine 14+ Years old  Completed   DEXA SCAN  Completed   Hepatitis C Screening  Completed   HPV VACCINES  Aged Out   COVID-19 Vaccine  Discontinued   Zoster Vaccines- Shingrix  Discontinued    Health Maintenance  There are no preventive care reminders to display for this patient.   Colorectal cancer screening: No longer required.   Mammogram status: Completed  . Repeat every year  Bone Density status: Ordered  . Pt provided with contact info and advised to call to schedule appt.  Lung Cancer Screening: (Low Dose CT Chest recommended if Age 30-80 years, 30 pack-year currently smoking OR have quit w/in 15years.) does not qualify.   Lung Cancer Screening Referral:   Additional Screening:  Hepatitis C Screening: does not qualify; Completed 2016  Vision Screening: Recommended annual ophthalmology exams for early detection of glaucoma and other disorders of the eye. Is the patient up to date with their annual eye exam?  No  Who is the provider or what is the name of the office in which the patient attends annual eye exams? Scheduled/  [facility] If pt is not established with a provider, would they like to be referred to a provider to establish care? No .   Dental Screening: Recommended annual dental exams for proper oral hygiene  Community Resource Referral / Chronic Care Management: CRR required this visit?  No   CCM required this visit?  No      Plan:     I have personally reviewed and noted the following in the patient's chart:   Medical and social history Use of alcohol, tobacco or illicit drugs  Current medications and supplements including opioid prescriptions. Patient is currently taking opioid prescriptions. Information provided to patient regarding non-opioid alternatives. Patient advised to discuss non-opioid treatment plan  with their provider. Functional ability and status Nutritional status Physical activity Advanced directives List of other physicians Hospitalizations, surgeries, and ER visits in previous 12 months Vitals Screenings to include cognitive, depression, and falls Referrals and appointments  In addition, I have reviewed and discussed with patient certain preventive protocols, quality metrics, and best practice recommendations. A written personalized care plan for preventive services as well as general preventive health recommendations were provided to patient.     Remi Haggard, LPN   [id number]/[id number]   Nurse Notes:

## 2022-12-21 NOTE — Patient Instructions (Signed)
Ms. Strandberg , Thank you for taking time to come for your Medicare Wellness Visit. I appreciate your ongoing commitment to your health goals. Please review the following plan we discussed and let me know if I can assist you in the future.   Screening recommendations/referrals: Colonoscopy: no longer required Mammogram: up to date Bone Density: ordered Recommended yearly ophthalmology/optometry visit for glaucoma screening and checkup Recommended yearly dental visit for hygiene and checkup  Vaccinations: Influenza vaccine: up to date Pneumococcal vaccine: up to date Tdap vaccine: up to date Shingles vaccine: Education provided    Advanced directives: Education provided  Conditions/risks identified:      Preventive Care 65 Years and Older, Female Preventive care refers to lifestyle choices and visits with your health care provider that can promote health and wellness. What does preventive care include? A yearly physical exam. This is also called an annual well check. Dental exams once or twice a year. Routine eye exams. Ask your health care provider how often you should have your eyes checked. Personal lifestyle choices, including: Daily care of your teeth and gums. Regular physical activity. Eating a healthy diet. Avoiding tobacco and drug use. Limiting alcohol use. Practicing safe sex. Taking low-dose aspirin every day. Taking vitamin and mineral supplements as recommended by your health care provider. What happens during an annual well check? The services and screenings done by your health care provider during your annual well check will depend on your age, overall health, lifestyle risk factors, and family history of disease. Counseling  Your health care provider may ask you questions about your: Alcohol use. Tobacco use. Drug use. Emotional well-being. Home and relationship well-being. Sexual activity. Eating habits. History of falls. Memory and ability to understand  (cognition). Work and work Astronomer. Reproductive health. Screening  You may have the following tests or measurements: Height, weight, and BMI. Blood pressure. Lipid and cholesterol levels. These may be checked every 5 years, or more frequently if you are over 51 years old. Skin check. Lung cancer screening. You may have this screening every year starting at age 55 if you have a 30-pack-year history of smoking and currently smoke or have quit within the past 15 years. Fecal occult blood test (FOBT) of the stool. You may have this test every year starting at age 55. Flexible sigmoidoscopy or colonoscopy. You may have a sigmoidoscopy every 5 years or a colonoscopy every 10 years starting at age 37. Hepatitis C blood test. Hepatitis B blood test. Sexually transmitted disease (STD) testing. Diabetes screening. This is done by checking your blood sugar (glucose) after you have not eaten for a while (fasting). You may have this done every 1-3 years. Bone density scan. This is done to screen for osteoporosis. You may have this done starting at age 70. Mammogram. This may be done every 1-2 years. Talk to your health care provider about how often you should have regular mammograms. Talk with your health care provider about your test results, treatment options, and if necessary, the need for more tests. Vaccines  Your health care provider may recommend certain vaccines, such as: Influenza vaccine. This is recommended every year. Tetanus, diphtheria, and acellular pertussis (Tdap, Td) vaccine. You may need a Td booster every 10 years. Zoster vaccine. You may need this after age 89. Pneumococcal 13-valent conjugate (PCV13) vaccine. One dose is recommended after age 62. Pneumococcal polysaccharide (PPSV23) vaccine. One dose is recommended after age 27. Talk to your health care provider about which screenings and vaccines you need  and how often you need them. This information is not intended to  replace advice given to you by your health care provider. Make sure you discuss any questions you have with your health care provider. Document Released: 09/18/2015 Document Revised: 05/11/2016 Document Reviewed: 06/23/2015 Elsevier Interactive Patient Education  2017 [employer] Prevention in the Home Falls can cause injuries. They can happen to people of all ages. There are many things you can do to make your home safe and to help prevent falls. What can I do on the outside of my home? Regularly fix the edges of walkways and driveways and fix any cracks. Remove anything that might make you trip as you walk through a door, such as a raised step or threshold. Trim any bushes or trees on the path to your home. Use bright outdoor lighting. Clear any walking paths of anything that might make someone trip, such as rocks or tools. Regularly check to see if handrails are loose or broken. Make sure that both sides of any steps have handrails. Any raised decks and porches should have guardrails on the edges. Have any leaves, snow, or ice cleared regularly. Use sand or salt on walking paths during winter. Clean up any spills in your garage right away. This includes oil or grease spills. What can I do in the bathroom? Use night lights. Install grab bars by the toilet and in the tub and shower. Do not use towel bars as grab bars. Use non-skid mats or decals in the tub or shower. If you need to sit down in the shower, use a plastic, non-slip stool. Keep the floor dry. Clean up any water that spills on the floor as soon as it happens. Remove soap buildup in the tub or shower regularly. Attach bath mats securely with double-sided non-slip rug tape. Do not have throw rugs and other things on the floor that can make you trip. What can I do in the bedroom? Use night lights. Make sure that you have a light by your bed that is easy to reach. Do not use any sheets or blankets that are too big for  your bed. They should not hang down onto the floor. Have a firm chair that has side arms. You can use this for support while you get dressed. Do not have throw rugs and other things on the floor that can make you trip. What can I do in the kitchen? Clean up any spills right away. Avoid walking on wet floors. Keep items that you use a lot in easy-to-reach places. If you need to reach something above you, use a strong step stool that has a grab bar. Keep electrical cords out of the way. Do not use floor polish or wax that makes floors slippery. If you must use wax, use non-skid floor wax. Do not have throw rugs and other things on the floor that can make you trip. What can I do with my stairs? Do not leave any items on the stairs. Make sure that there are handrails on both sides of the stairs and use them. Fix handrails that are broken or loose. Make sure that handrails are as long as the stairways. Check any carpeting to make sure that it is firmly attached to the stairs. Fix any carpet that is loose or worn. Avoid having throw rugs at the top or bottom of the stairs. If you do have throw rugs, attach them to the floor with carpet tape. Make sure that you  have a light switch at the top of the stairs and the bottom of the stairs. If you do not have them, ask someone to add them for you. What else can I do to help prevent falls? Wear shoes that: Do not have high heels. Have rubber bottoms. Are comfortable and fit you well. Are closed at the toe. Do not wear sandals. If you use a stepladder: Make sure that it is fully opened. Do not climb a closed stepladder. Make sure that both sides of the stepladder are locked into place. Ask someone to hold it for you, if possible. Clearly mark and make sure that you can see: Any grab bars or handrails. First and last steps. Where the edge of each step is. Use tools that help you move around (mobility aids) if they are needed. These  include: Canes. Walkers. Scooters. Crutches. Turn on the lights when you go into a dark area. Replace any light bulbs as soon as they burn out. Set up your furniture so you have a clear path. Avoid moving your furniture around. If any of your floors are uneven, fix them. If there are any pets around you, be aware of where they are. Review your medicines with your doctor. Some medicines can make you feel dizzy. This can increase your chance of falling. Ask your doctor what other things that you can do to help prevent falls. This information is not intended to replace advice given to you by your health care provider. Make sure you discuss any questions you have with your health care provider. Document Released: 06/18/2009 Document Revised: 01/28/2016 Document Reviewed: 09/26/2014 Elsevier Interactive Patient Education  2017 [employer].

## 2022-12-22 DIAGNOSIS — M5416 Radiculopathy, lumbar region: Secondary | ICD-10-CM | POA: Diagnosis not present

## 2022-12-23 NOTE — Procedures (Unsigned)
Physician Interpretation:   Referred by: Shade Flood, MD   History and Indication for Testing: 72 year old female with an underlying medical history of hypertension, tremors, mood disorder, arthritis, reflux disease, history of hepatitis C, hyperlipidemia, hypothyroidism, chronic kidney disease, and mild obesity, who reports snoring and excessive daytime somnolence.  She was previously diagnosed with obstructive sleep apnea. She tried PAP therapy in the past, but could not tolerate it at the time.  Her Epworth sleepiness score is 6 out of 24, fatigue severity score is 47 out of 63.   EEG: Review of the EEG showed no abnormal electrical discharges and symmetrical bihemispheric findings.    EKG: The EKG revealed normal sinus rhythm (NSR).   AUDIO/VIDEO REVIEW: The audio and video review did not show any abnormal or unusual behaviors, movements, phonations or vocalizations. The patient took 2 bathroom breaks. Snoring was in the moderate to loud range.  POST-STUDY QUESTIONNAIRE: Post study, the patient indicated, that sleep was better than usual.  IMPRESSION:   Moderate Obstructive Sleep Apnea (OSA) Dysfunctions associated with sleep stages or arousal from sleep  RECOMMENDATIONS:   This study demonstrates moderate obstructive sleep apnea, with a total AHI of 26.5/hour, REM AHI of 24.2/hour, supine AHI of 17.8/hour and O2 nadir of 79%. Treatment with a positive airway pressure (PAP) device is recommended.  The patient will be advised to proceed with home autoPAP therapy for now.  A laboratory attended, full night PAP-titration study can be considered down the road, to optimize therapy settings, mask fit, monitoring of tolerance and of proper oxygen saturations. Other treatment options may be limited, and may include (generally speaking) surgical options in selected patients. Concomitant weight loss is recommended. Please note that untreated obstructive sleep apnea may carry additional  perioperative morbidity. Patients with significant obstructive sleep apnea should receive perioperative PAP therapy and the surgeons and particularly the anesthesiologist should be informed of the diagnosis and the severity of the sleep disordered breathing. This study shows sleep fragmentation and abnormal sleep stage percentages; these are nonspecific findings and per se do not signify an intrinsic sleep disorder or a cause for the patient's sleep-related symptoms. Causes include (but are not limited to) the first night effect of the sleep study, circadian rhythm disturbances, medication effect or an underlying mood disorder or medical problem.  The patient should be cautioned not to drive, work at heights, or operate dangerous or heavy equipment when tired or sleepy. Review and reiteration of good sleep hygiene measures should be pursued with any patient. The patient will be seen in follow-up in the sleep clinic at [facility] for discussion of the test results, symptom and treatment compliance review, further management strategies, etc. The patient and the referring provider will be notified of the test results.   I certify that I have reviewed the entire raw data recording prior to the issuance of this report in accordance with the Standards of Accreditation of the American Academy of Sleep Medicine (AASM).  Huston Foley, MD, PhD Medical Director, [facility]) Diplomat, ABPN (Neurology and Sleep)    Technical Report:   General Information  Name: Becky Lawson, Becky Lawson BMI: 16.10 Physician: Huston Foley, MD  ID: [id number] Height: 64.0 in Technician: Margaretann Loveless, RPSGT  Sex: Female Weight: 199.0 lb Record: [id number]  Age: 72 [11-11-1950] Date: 12/20/2022    Medical & Medication History    72 year old female with an underlying medical history of hypertension, tremors, mood disorder, arthritis, reflux disease, history of hepatitis C, hyperlipidemia, hypothyroidism,  chronic kidney disease, and mild obesity, who reports snoring and excessive daytime somnolence. She was previously diagnosed with obstructive sleep apnea. The overall apnea/hypopnea index (AHI) was 10.4 per hour. There were 12 total apneas, including 12 obstructive, 0 central and 0 mixed apneas. There were 39 hypopneas and 33 RERAs. The AHI during Stage REM sleep was N/A per hour. AHI while supine was 50.2 per hour. The mean oxygen saturation was 90.6%. The minimum SpO2 during sleep was 83.0%. moderate snoring was noted during this study.  Tylenol, Xanax, Norvasc, Lipitor, Astelin, Colace, Dilaudid, Atarax, Lamictal, Synthorid, Prilosec, Miralax, Seroquel, Desyrel, Vilazodone, Vitamin D, Dextrostat, Bactroban   Sleep Disorder      Comments   The patient came into the lab for a PSG. Prior sleep study on 06/02/18 with [facility]. The patient took Lamictal, Seroquel, Trazodone, Xanax, and Dilaudid prior to start of sleep study. Per the patient she did not take Hydroxyzine. The patient had three restroom breaks. EKG kept in NSR. Moderate to loud snoring. All sleep stages witnessed. Respiratory events scored with a 4% desat. Slept lateral and supine. AHI 23.7 after 2 hrs of TST. Respiratory events were worse supine and on left side.     Lights out: 10:13:22 PM Lights on: 05:14:27 AM   Time Total Supine Side Prone Upright  Recording (TRT) 7h 1.52m 2h 3.58m 4h 58.39m 0h 0.18m 0h 0.61m  Sleep (TST) 5h 8.69m 1h 21.22m 3h 47.61m 0h 0.38m 0h 0.60m   Latency N1 N2 N3 REM Onset Per. Slp. Eff.  Actual 0h 0.41m 0h 3.47m 4h 0.39m 2h 43.46m 0h 53.60m 0h 53.79m 73.16%   Stg Dur Wake N1 N2 N3 REM  Total 113.0 31.0 211.0 14.0 52.0  Supine 42.0 8.0 73.0 0.0 0.0  Side 71.0 23.0 138.0 14.0 52.0  Prone 0.0 0.0 0.0 0.0 0.0  Upright 0.0 0.0 0.0 0.0 0.0   Stg % Wake N1 N2 N3 REM  Total 26.8 10.1 68.5 4.5 16.9  Supine 10.0 2.6 23.7 0.0 0.0  Side 16.9 7.5 44.8 4.5 16.9  Prone 0.0 0.0 0.0 0.0 0.0  Upright 0.0 0.0 0.0 0.0  0.0     Apnea Summary Sub Supine Side Prone Upright  Total 0 Total 0 0 0 0 0    REM 0 0 0 0 0    NREM 0 0 0 0 0  Obs 0 REM 0 0 0 0 0    NREM 0 0 0 0 0  Mix 0 REM 0 0 0 0 0    NREM 0 0 0 0 0  Cen 0 REM 0 0 0 0 0    NREM 0 0 0 0 0   Rera Summary Sub Supine Side Prone Upright  Total 0 Total 0 0 0 0 0    REM 0 0 0 0 0    NREM 0 0 0 0 0   Hypopnea Summary Sub Supine Side Prone Upright  Total 138 Total 138 24 114 0 0    REM 21 0 21 0 0    NREM 117 24 93 0 0   4% Hypopnea Summary Sub Supine Side Prone Upright  Total (4%) 136 Total 136 24 112 0 0    REM 20 0 20 0 0    NREM 116 24 92 0 0     AHI Total Obs Mix Cen  26.88 Apnea 0.00 0.00 0.00 0.00   Hypopnea 26.88 -- -- --  26.49 Hypopnea (4%) 26.49 -- -- --  Total Supine Side Prone Upright  Position AHI 26.88 17.78 30.13 0.00 0.00  REM AHI 24.23   NREM AHI 27.42   Position RDI 26.88 17.78 30.13 0.00 0.00  REM RDI 24.23   NREM RDI 27.42    4% Hypopnea Total Supine Side Prone Upright  Position AHI (4%) 26.49 17.78 29.60 0.00 0.00  REM AHI (4%) 23.08   NREM AHI (4%) 27.19   Position RDI (4%) 26.49 17.78 29.60 0.00 0.00  REM RDI (4%) 23.08   NREM RDI (4%) 27.19    Desaturation Information Threshold: 2% <100% <90% <80% <70% <60% <50% <40%  Supine 70.0 48.0 0.0 0.0 0.0 0.0 0.0  Side 221.0 88.0 0.0 0.0 0.0 0.0 0.0  Prone 0.0 0.0 0.0 0.0 0.0 0.0 0.0  Upright 0.0 0.0 0.0 0.0 0.0 0.0 0.0  Total 291.0 136.0 0.0 0.0 0.0 0.0 0.0  Index 47.5 22.2 0.0 0.0 0.0 0.0 0.0   Threshold: 3% <100% <90% <80% <70% <60% <50% <40%  Supine 38.0 26.0 0.0 0.0 0.0 0.0 0.0  Side 150.0 86.0 0.0 0.0 0.0 0.0 0.0  Prone 0.0 0.0 0.0 0.0 0.0 0.0 0.0  Upright 0.0 0.0 0.0 0.0 0.0 0.0 0.0  Total 188.0 112.0 0.0 0.0 0.0 0.0 0.0  Index 30.7 18.3 0.0 0.0 0.0 0.0 0.0   Threshold: 4% <100% <90% <80% <70% <60% <50% <40%  Supine 37.0 26.0 0.0 0.0 0.0 0.0 0.0  Side 133.0 85.0 0.0 0.0 0.0 0.0 0.0  Prone 0.0 0.0 0.0 0.0 0.0 0.0 0.0  Upright 0.0 0.0 0.0  0.0 0.0 0.0 0.0  Total 170.0 111.0 0.0 0.0 0.0 0.0 0.0  Index 27.8 18.1 0.0 0.0 0.0 0.0 0.0   Threshold: 3% <100% <90% <80% <70% <60% <50% <40%  Supine 38 26 0 0 0 0 0  Side 150 86 0 0 0 0 0  Prone 0 0 0 0 0 0 0  Upright 0 0 0 0 0 0 0  Total 188 112 0 0 0 0 0   Awakening/Arousal Information # of Awakenings 24  Wake after sleep onset 59.16m  Wake after persistent sleep 59.31m   Arousal Assoc. Arousals Index  Apneas 0 0.0  Hypopneas 30 5.8  Leg Movements 0 0.0  Snore 0 0.0  PTT Arousals 0 0.0  Spontaneous 29 5.6  Total 59 11.5  Leg Movement Information PLMS LMs Index  Total LMs during PLMS 0 0.0  LMs w/ Microarousals 0 0.0   LM LMs Index  w/ Microarousal 0 0.0  w/ Awakening 0 0.0  w/ Resp Event 0 0.0  Spontaneous 1 0.2  Total 1 0.2     Desaturation threshold setting: 3% Minimum desaturation setting: 10 seconds SaO2 nadir: 72% The longest event was a 54 sec obstructive Hypopnea with a minimum SaO2 of 85%. The lowest SaO2 was 79% associated with a 26 sec obstructive Hypopnea. EKG Rates EKG Avg Max Min  Awake 83 105 68  Asleep 75 93 66  EKG Events: Tachycardia

## 2022-12-27 NOTE — Addendum Note (Signed)
Addended by: Huston Foley on: 12/27/2022 08:10 AM   Modules accepted: Orders

## 2022-12-29 ENCOUNTER — Telehealth: Payer: Self-pay

## 2022-12-29 NOTE — Telephone Encounter (Signed)
I called patient to discuss. No answer, left a message asking her to call us back. If patient returns call, please route to POD 4. 

## 2022-12-29 NOTE — Telephone Encounter (Signed)
-----   Message from Huston Foley, MD sent at 12/27/2022  8:10 AM EDT ----- Patient referred by PCP for re-eval of her OSA, seen by me on 11/07/22, diagnostic PSG on 12/20/22.    Please call and notify the patient that the recent sleep study showed moderate obstructive sleep apnea. I recommend treatment for in the form of autoPAP. We may consider at a CPAP titration study at a later date, if need be, which means, that we would ask her to come back in for a second sleep study with CPAP treatment. For now, I would like to start her on a so-called autoPAP machine at home, through a DME company (of her choice, or as per insurance requirement). The DME representative will educate her on how to use the machine, how to put the mask on, etc. I have placed an order in the chart. Please send referral, talk to patient, send report to referring MD. We will need a FU in sleep clinic for 10 weeks post-PAP set up, please arrange that with me or one of our NPs. Thanks,   Huston Foley, MD, PhD [facility])

## 2023-01-02 DIAGNOSIS — M5136 Other intervertebral disc degeneration, lumbar region: Secondary | ICD-10-CM | POA: Diagnosis not present

## 2023-01-02 DIAGNOSIS — M47896 Other spondylosis, lumbar region: Secondary | ICD-10-CM | POA: Diagnosis not present

## 2023-01-02 DIAGNOSIS — Z79891 Long term (current) use of opiate analgesic: Secondary | ICD-10-CM | POA: Diagnosis not present

## 2023-01-03 NOTE — Telephone Encounter (Signed)
I called pt. I advised pt that Dr. Frances Furbish reviewed their sleep study results and found that pt has moderate OSA. Dr. Frances Furbish recommends that pt start autopap. I reviewed PAP compliance expectations with the pt. Pt is agreeable to starting an auto-PAP. I advised pt that an order will be sent to a DME, [facility], and they will call the pt within about one week after they file with the pt's insurance. They will show the pt how to use the machine, fit for masks, and troubleshoot the auto-PAP if needed. A follow up appt will be made for insurance purposes with provider 31-89 days out from start autopap. Pt verbalized understanding of results. Pt had no questions at this time but was encouraged to call back if questions arise. I have sent the order to [facility] and have received confirmation that they have received the order.

## 2023-01-09 ENCOUNTER — Other Ambulatory Visit: Payer: Self-pay | Admitting: Behavioral Health

## 2023-01-09 DIAGNOSIS — F5105 Insomnia due to other mental disorder: Secondary | ICD-10-CM

## 2023-01-09 DIAGNOSIS — F3162 Bipolar disorder, current episode mixed, moderate: Secondary | ICD-10-CM

## 2023-01-09 DIAGNOSIS — F32A Depression, unspecified: Secondary | ICD-10-CM

## 2023-01-09 DIAGNOSIS — F411 Generalized anxiety disorder: Secondary | ICD-10-CM

## 2023-01-12 ENCOUNTER — Telehealth: Payer: Self-pay | Admitting: Pharmacist

## 2023-01-12 NOTE — Progress Notes (Signed)
Care Management & Coordination Services Pharmacy Team   Reason for Encounter: Medication coordination and delivery   Contacted patient to discuss medications and coordinate delivery from [facility]. Spoke with patient on 01/12/2023. She said she was not at home and would return my call. Called patient again 01/16/23 and left message. Unable to reach patient.  Cycle dispensing form sent to Erskine Emery, CPP for review.   Last adherence delivery date: 07/29/23      Patient is due for next adherence delivery on: 01/25/23  This delivery to include: Adherence Packaging  90 Days   QUEtiapine (SEROQUEL) 50 MG tablet 1 tablet by mouth daily at bedtime Trazodone 150 mg 2 tablet at bedtime  Vitamin D 50,000 1 capsule every Sunday am  Alprazolam 1 mg 1 tablet at bedtime as needed (in vial) Omeprazole 40 mg (I tablet daily at bedtime) Levothyroxine (SYNTHROID) 100 MCG tablet 1 tablet by mouth daily 30 min prior to breakfast Amlodipine (NORVASC) 10 MG tablet 1 tablet daily at bedtime Hydroxyzine 25mg  q8h prn Viibryd 20mg  take 1 tablet every morning at breakfast  Lamotrigine 100 mg 2 tabs in am and 1 in pm Atorvastatin 40 mg 1 tablet daily   Patient declined the following medications this month:   No refill request needed. No refill request needed.  Confirmed delivery date of 01/25/23, advised patient that pharmacy will contact them the morning of delivery.   Any concerns about your medications? No  How often do you forget or accidentally miss a dose? Rarely  Do you use a pillbox? No  Is patient in packaging Yes  If yes  What is the date on your next pill pack?  Any concerns or issues with your packaging? 01/28/23    Medications: Outpatient Encounter Medications as of 01/12/2023  Medication Sig   acetaminophen (TYLENOL) 500 MG tablet Take 2 tablets (1,000 mg total) by mouth every 6 (six) hours.   ALPRAZolam (XANAX) 1 MG tablet TAKE 1/2 TO 1 TABLET BY MOUTH twice daily AS NEEDED  FOR ANXIETY   amLODipine (NORVASC) 10 MG tablet Take 1 tablet (10 mg total) by mouth daily.   atorvastatin (LIPITOR) 40 MG tablet Take 1 tablet (40 mg total) by mouth daily.   azelastine (ASTELIN) 0.1 % nasal spray Place 1-2 sprays into both nostrils 2 (two) times daily. Use in each nostril as directed   dextroamphetamine (DEXTROSTAT) 10 MG tablet TAKE ONE TABLET BY MOUTH EVERY MORNING   docusate sodium (COLACE) 100 MG capsule Take 1 capsule (100 mg total) by mouth 2 (two) times daily.   HYDROmorphone (DILAUDID) 4 MG tablet Take 4 mg by mouth 3 (three) times daily as needed for severe pain.   hydrOXYzine (ATARAX) 25 MG tablet TAKE ONE TABLET BY MOUTH every EIGHT hours AS NEEDED   lamoTRIgine (LAMICTAL) 100 MG tablet TAKE TWO TABLETS BY MOUTH EVERY MORNING and TAKE ONE TABLET BY MOUTH EVERY EVENING   levothyroxine (SYNTHROID) 100 MCG tablet TAKE ONE TABLET BY MOUTH BEFORE breakfast   omeprazole (PRILOSEC) 40 MG capsule Take 1 capsule (40 mg total) by mouth at bedtime.   polyethylene glycol (MIRALAX / GLYCOLAX) 17 g packet Take 17 g by mouth daily as needed for mild constipation.   QUEtiapine (SEROQUEL) 50 MG tablet TAKE ONE TABLET BY MOUTH ONCE DAILY   traZODone (DESYREL) 150 MG tablet TAKE TWO TABLETS BY MOUTH everyday AT bedtime   Vilazodone HCl 20 MG TABS Take 1 tablet (20 mg total) by mouth daily.   Vitamin D,  Ergocalciferol, (DRISDOL) 1.25 MG (50000 UNIT) CAPS capsule Take 1 capsule (50,000 Units total) by mouth every 7 (seven) days. Sunday   Facility-Administered Encounter Medications as of 01/12/2023  Medication Note   cyanocobalamin ((VITAMIN B-12)) injection 1,000 mcg 08/08/2019: Not taking   BP Readings from Last 3 Encounters:  12/14/22 132/70  11/07/22 (!) 149/72  09/30/22 (!) 158/72    Pulse Readings from Last 3 Encounters:  12/14/22 (!) 103  11/07/22 89  09/30/22 (!) 103    Lab Results  Component Value Date/Time   HGBA1C 5.5 09/15/2022 10:15 AM   HGBA1C 5.0 07/12/2018 04:48  PM   Lab Results  Component Value Date   CREATININE 0.87 09/15/2022   BUN 12 09/15/2022   GFR 66.88 09/15/2022   GFRNONAA >60 07/06/2022   GFRAA 80 04/30/2020   NA 141 09/15/2022   K 4.6 09/15/2022   CALCIUM 9.0 09/15/2022   CO2 28 09/15/2022     Future Appointments  Date Time Provider Department Center  02/09/2023  2:30 PM Joan Flores, NP [facility] None  01/03/2024  1:30 PM [facility] ANNUAL WELLNESS VISIT [facility]    [employer], [facility]

## 2023-01-19 ENCOUNTER — Other Ambulatory Visit: Payer: Self-pay | Admitting: Behavioral Health

## 2023-01-19 DIAGNOSIS — F411 Generalized anxiety disorder: Secondary | ICD-10-CM

## 2023-01-20 ENCOUNTER — Other Ambulatory Visit: Payer: Self-pay | Admitting: Behavioral Health

## 2023-01-20 DIAGNOSIS — F32A Depression, unspecified: Secondary | ICD-10-CM

## 2023-01-20 DIAGNOSIS — R5383 Other fatigue: Secondary | ICD-10-CM

## 2023-01-20 NOTE — Telephone Encounter (Signed)
Is she taking? She was supposed to wean off at one time but felt she couldn't. March note mentions weaning.

## 2023-01-23 NOTE — Telephone Encounter (Signed)
LVM to [facility] 

## 2023-02-01 DIAGNOSIS — M47816 Spondylosis without myelopathy or radiculopathy, lumbar region: Secondary | ICD-10-CM | POA: Diagnosis not present

## 2023-02-01 DIAGNOSIS — Z79899 Other long term (current) drug therapy: Secondary | ICD-10-CM | POA: Diagnosis not present

## 2023-02-01 DIAGNOSIS — M5136 Other intervertebral disc degeneration, lumbar region: Secondary | ICD-10-CM | POA: Diagnosis not present

## 2023-02-01 DIAGNOSIS — Z79891 Long term (current) use of opiate analgesic: Secondary | ICD-10-CM | POA: Diagnosis not present

## 2023-02-01 DIAGNOSIS — Z5181 Encounter for therapeutic drug level monitoring: Secondary | ICD-10-CM | POA: Diagnosis not present

## 2023-02-06 ENCOUNTER — Telehealth: Payer: Self-pay | Admitting: Pharmacist

## 2023-02-06 NOTE — Progress Notes (Unsigned)
Care Management & Coordination Services Pharmacy Team   Reason for Encounter: Hypertension   Contacted patient to discuss hypertension disease state. Unsuccessful outreach. Left voicemail for patient to return call.    Current antihypertensive regimen:  amLODipine (NORVASC) 10 MG tablet   Patient verbally confirms she is taking the above medications as directed. {yes/no:20286}  How often are you checking your Blood Pressure? {CHL HP BP Monitoring [phone]}  she checks her blood pressure {timing:25218} {before/after:25217} taking her medication.  Current home BP readings: ***  DATE:             BP               PULSE   Wrist or arm cuff: Caffeine intake: Salt intake: OTC medications including pseudoephedrine or NSAIDs?  Any readings above 180/100? {yes/no:20286} If yes any symptoms of hypertensive emergency? {hypertensive emergency symptoms:25354}  What recent interventions/DTPs have been made by any provider to improve Blood Pressure control since last CPP Visit:  Patient reported  Any recent hospitalizations or ED visits since last visit with CPP? No  What diet changes have been made to improve Blood Pressure Control?  Patient reported  What exercise is being done to improve your Blood Pressure Control?  Patient reported  Adherence Review: Is the patient currently on ACE/ARB medication? No Does the patient have >5 day gap between last estimated fill dates? No  Star Rating Drugs:  Medication:   Last Fill: Day Supply  Atorvastatin 40 MG tablet  01/30/23 90   Chart Updates: Recent office visits:  None noted  Recent consult visits:  None noted  Hospital visits:  None in previous 6 months  Medications: Outpatient Encounter Medications as of 02/06/2023  Medication Sig   acetaminophen (TYLENOL) 500 MG tablet Take 2 tablets (1,000 mg total) by mouth every 6 (six) hours.   ALPRAZolam (XANAX) 1 MG tablet TAKE 1/2 TO 1 TABLET BY MOUTH twice daily AS  NEEDED FOR ANXIETY   amLODipine (NORVASC) 10 MG tablet Take 1 tablet (10 mg total) by mouth daily.   atorvastatin (LIPITOR) 40 MG tablet Take 1 tablet (40 mg total) by mouth daily.   azelastine (ASTELIN) 0.1 % nasal spray Place 1-2 sprays into both nostrils 2 (two) times daily. Use in each nostril as directed   dextroamphetamine (DEXTROSTAT) 10 MG tablet TAKE ONE TABLET BY MOUTH EVERY MORNING   docusate sodium (COLACE) 100 MG capsule Take 1 capsule (100 mg total) by mouth 2 (two) times daily.   HYDROmorphone (DILAUDID) 4 MG tablet Take 4 mg by mouth 3 (three) times daily as needed for severe pain.   hydrOXYzine (ATARAX) 25 MG tablet TAKE ONE TABLET BY MOUTH every EIGHT hours AS NEEDED   lamoTRIgine (LAMICTAL) 100 MG tablet TAKE TWO TABLETS BY MOUTH EVERY MORNING and TAKE ONE TABLET BY MOUTH EVERY EVENING   levothyroxine (SYNTHROID) 100 MCG tablet TAKE ONE TABLET BY MOUTH BEFORE breakfast   omeprazole (PRILOSEC) 40 MG capsule Take 1 capsule (40 mg total) by mouth at bedtime.   polyethylene glycol (MIRALAX / GLYCOLAX) 17 g packet Take 17 g by mouth daily as needed for mild constipation.   QUEtiapine (SEROQUEL) 50 MG tablet TAKE ONE TABLET BY MOUTH ONCE DAILY   traZODone (DESYREL) 150 MG tablet TAKE TWO TABLETS BY MOUTH everyday AT bedtime   Vilazodone HCl 20 MG TABS Take 1 tablet (20 mg total) by mouth daily.   Vitamin D, Ergocalciferol, (DRISDOL) 1.25 MG (50000 UNIT) CAPS capsule Take 1 capsule (50,000 Units  total) by mouth every 7 (seven) days. Sunday   Facility-Administered Encounter Medications as of 02/06/2023  Medication Note   cyanocobalamin ((VITAMIN B-12)) injection 1,000 mcg 08/08/2019: Not taking    Recent Office Vitals: BP Readings from Last 3 Encounters:  12/14/22 132/70  11/07/22 (!) 149/72  09/30/22 (!) 158/72   Pulse Readings from Last 3 Encounters:  12/14/22 (!) 103  11/07/22 89  09/30/22 (!) 103    Wt Readings from Last 3 Encounters:  12/14/22 206 lb 12.8 oz (93.8 kg)   11/07/22 199 lb (90.3 kg)  09/30/22 196 lb 4 oz (89 kg)     Kidney Function Lab Results  Component Value Date/Time   CREATININE 0.87 09/15/2022 10:09 AM   CREATININE 0.72 07/06/2022 03:29 AM   CREATININE 0.79 07/05/2016 08:25 AM   CREATININE 0.75 07/02/2016 11:03 AM   GFR 66.88 09/15/2022 10:09 AM   GFRNONAA >60 07/06/2022 03:29 AM   GFRAA 80 04/30/2020 02:36 PM       Latest Ref Rng & Units 09/15/2022   10:09 AM 07/06/2022    3:29 AM 06/23/2022    2:48 PM  BMP  Glucose 70 - 99 mg/dL 119  147  829   BUN 6 - 23 mg/dL 12  13  15    Creatinine 0.40 - 1.20 mg/dL 5.62  1.30  8.65   Sodium 135 - 145 mEq/L 141  136  140   Potassium 3.5 - 5.1 mEq/L 4.6  4.1  4.2   Chloride 96 - 112 mEq/L 105  105  106   CO2 19 - 32 mEq/L 28  24  26    Calcium 8.4 - 10.5 mg/dL 9.0  8.4  9.2      Future Appointments  Date Time Provider Department Center  02/09/2023  2:30 PM Joan Flores, NP [facility] None  01/03/2024  1:30 PM [facility] ANNUAL WELLNESS VISIT [facility]     [employer], [facility]

## 2023-02-09 ENCOUNTER — Encounter: Payer: Self-pay | Admitting: Behavioral Health

## 2023-02-09 ENCOUNTER — Ambulatory Visit (INDEPENDENT_AMBULATORY_CARE_PROVIDER_SITE_OTHER): Payer: PPO | Admitting: Behavioral Health

## 2023-02-09 DIAGNOSIS — R5383 Other fatigue: Secondary | ICD-10-CM

## 2023-02-09 DIAGNOSIS — F3162 Bipolar disorder, current episode mixed, moderate: Secondary | ICD-10-CM | POA: Diagnosis not present

## 2023-02-09 DIAGNOSIS — F411 Generalized anxiety disorder: Secondary | ICD-10-CM | POA: Diagnosis not present

## 2023-02-09 DIAGNOSIS — F5105 Insomnia due to other mental disorder: Secondary | ICD-10-CM | POA: Diagnosis not present

## 2023-02-09 DIAGNOSIS — F99 Mental disorder, not otherwise specified: Secondary | ICD-10-CM

## 2023-02-09 MED ORDER — VILAZODONE HCL 20 MG PO TABS: 1.0000 | ORAL_TABLET | Freq: Every day | ORAL | 3 refills | Status: DC

## 2023-02-09 MED ORDER — QUETIAPINE FUMARATE 50 MG PO TABS: 50.0000 mg | ORAL_TABLET | Freq: Every day | ORAL | 1 refills | Status: DC

## 2023-02-09 MED ORDER — DEXTROAMPHETAMINE SULFATE 10 MG PO TABS
10.0000 mg | ORAL_TABLET | Freq: Every morning | ORAL | 0 refills | Status: DC
Start: 2023-02-09 — End: 2023-05-09

## 2023-02-09 MED ORDER — TRAZODONE HCL 150 MG PO TABS
ORAL_TABLET | ORAL | 0 refills | Status: DC
Start: 2023-02-09 — End: 2023-05-12

## 2023-02-09 NOTE — Progress Notes (Signed)
[facility] Check  Patient ID: Becky Lawson,  MRN: [id number]  PCP: Shade Flood, MD  Date of Evaluation: 02/09/2023 Time spent:30 minutes  Chief Complaint:  Chief Complaint   Anxiety; Depression; Follow-up; Medication Refill; Patient Education; Fatigue     HISTORY/CURRENT STATUS: HPI Becky Lawson presents for follow-up and medication management. No manic episodes since last visit. She is ambulating without walker since hip surgery. Fine tremors in both hand were improved today, barely noticeable. Says she has been taking Viibryd 20 mg every other day. She is requesting no other medication changes this visit. And is requesting 3 mo, follow up. Says her anxiety today is 0/10 and depression is 3/10. She was sleeping  7 hours per night with aid of medication but last night slept 0. She denies mania, no psychosis, NO SI/HI.   Past medications for mental health diagnoses include: Lithium, Seroquel, Effexor, Prozac, Latuda, Abilify, Dexedrine, Cymbalta caused weight gain.  Modafinil.       Individual Medical History/ Review of Systems: Changes? :No   Allergies: Patient has no known allergies.  Current Medications:  Current Outpatient Medications:    acetaminophen (TYLENOL) 500 MG tablet, Take 2 tablets (1,000 mg total) by mouth every 6 (six) hours., Disp: 30 tablet, Rfl: 0   ALPRAZolam (XANAX) 1 MG tablet, TAKE 1/2 TO 1 TABLET BY MOUTH twice daily AS NEEDED FOR ANXIETY, Disp: 45 tablet, Rfl: 3   amLODipine (NORVASC) 10 MG tablet, Take 1 tablet (10 mg total) by mouth daily., Disp: 90 tablet, Rfl: 1   atorvastatin (LIPITOR) 40 MG tablet, Take 1 tablet (40 mg total) by mouth daily., Disp: 90 tablet, Rfl: 1   azelastine (ASTELIN) 0.1 % nasal spray, Place 1-2 sprays into both nostrils 2 (two) times daily. Use in each nostril as directed, Disp: 30 mL, Rfl: 3   dextroamphetamine (DEXTROSTAT) 10 MG tablet, Take 1 tablet (10 mg total) by mouth every morning., Disp: 30 tablet, Rfl:  0   docusate sodium (COLACE) 100 MG capsule, Take 1 capsule (100 mg total) by mouth 2 (two) times daily., Disp: 10 capsule, Rfl: 0   HYDROmorphone (DILAUDID) 4 MG tablet, Take 4 mg by mouth 3 (three) times daily as needed for severe pain., Disp: , Rfl:    hydrOXYzine (ATARAX) 25 MG tablet, TAKE ONE TABLET BY MOUTH every EIGHT hours AS NEEDED, Disp: 60 tablet, Rfl: 5   lamoTRIgine (LAMICTAL) 100 MG tablet, TAKE TWO TABLETS BY MOUTH EVERY MORNING and TAKE ONE TABLET BY MOUTH EVERY EVENING, Disp: 270 tablet, Rfl: 1   levothyroxine (SYNTHROID) 100 MCG tablet, TAKE ONE TABLET BY MOUTH BEFORE breakfast, Disp: 90 tablet, Rfl: 1   omeprazole (PRILOSEC) 40 MG capsule, Take 1 capsule (40 mg total) by mouth at bedtime., Disp: 90 capsule, Rfl: 3   polyethylene glycol (MIRALAX / GLYCOLAX) 17 g packet, Take 17 g by mouth daily as needed for mild constipation., Disp: 14 each, Rfl: 0   QUEtiapine (SEROQUEL) 50 MG tablet, Take 1 tablet (50 mg total) by mouth daily., Disp: 90 tablet, Rfl: 1   traZODone (DESYREL) 150 MG tablet, TAKE TWO TABLETS BY MOUTH everyday AT bedtime, Disp: 180 tablet, Rfl: 0   Vilazodone HCl 20 MG TABS, Take 1 tablet (20 mg total) by mouth daily., Disp: 30 tablet, Rfl: 3   Vitamin D, Ergocalciferol, (DRISDOL) 1.25 MG (50000 UNIT) CAPS capsule, Take 1 capsule (50,000 Units total) by mouth every 7 (seven) days. Sunday, Disp: 12 capsule, Rfl: 3  Current Facility-Administered Medications:  cyanocobalamin ((VITAMIN B-12)) injection 1,000 mcg, 1,000 mcg, Intramuscular, Q30 days, Sherren Mocha, MD, 1,000 mcg at 07/12/18 1652 Medication Side Effects: none  Family Medical/ Social History: Changes? No  MENTAL HEALTH EXAM:  There were no vitals taken for this visit.There is no height or weight on file to calculate BMI.  General Appearance: Casual, Neat, and Well Groomed  Eye Contact:  Good  Speech:  Clear and Coherent  Volume:  Normal  Mood:  NA  Affect:  Appropriate  Thought Process:  Coherent   Orientation:  Full (Time, Place, and Person)  Thought Content: Logical   Suicidal Thoughts:  No  Homicidal Thoughts:  No  Memory:  WNL  Judgement:  Good  Insight:  Good  Psychomotor Activity:  Normal  Concentration:  Concentration: Good  Recall:  Good  Fund of Knowledge: Good  Language: Good  Assets:  Desire for Improvement  ADL's:  Intact  Cognition: WNL  Prognosis:  Good    DIAGNOSES:    ICD-10-CM   1. Fatigue, unspecified type  R53.83 dextroamphetamine (DEXTROSTAT) 10 MG tablet    2. Generalized anxiety disorder  F41.1 QUEtiapine (SEROQUEL) 50 MG tablet    traZODone (DESYREL) 150 MG tablet    Vilazodone HCl 20 MG TABS    3. Bipolar 1 disorder, mixed, moderate (HCC)  F31.62 QUEtiapine (SEROQUEL) 50 MG tablet    4. Insomnia due to other mental disorder  F51.05 QUEtiapine (SEROQUEL) 50 MG tablet   F99 traZODone (DESYREL) 150 MG tablet      Receiving Psychotherapy: No    RECOMMENDATIONS:   Greater than 50% of 30 min of face to face time  was spent on counseling and coordination of care. We discussed her current moderate improvement with medication. She request no medication changes this visit. We talked about her increase in exercise. She has membership to [employer] now and will be going to the [facility]. She would like to be more social and have companionship.  -Conducted AIMS  Score of 1  today.  02/09/2023 Continue Seroquel 50 mg daily at bedtime Continue hydroxyzine 25 mg nightly 8H as needed. Continue Lamictal  300 mg daily. 200 mg in the am and 100 mg in the evening. Continue Viibryd 20 mg every other day. Continue the  trazodone 300 mg, 1.5 pills nightly. Continue Mirtazapine 7.5 mg at bedtime. May increase to 15 mg if needed.  Continue Xanax 1 mg at bedtime but if taking Return in 3 months to reassess.  Discussed potential benefits, risks, and side effects of stimulants with patient to include increased heart rate, palpitations, insomnia, increased anxiety,  increased irritability, or decreased appetite.  Instructed patient to contact office if experiencing any significant tolerability issues.  Discussed potential metabolic side effects associated with atypical antipsychotics, as well as potential risk for movement side effects. Advised pt to contact office if movement side effects occur.   PDMP was reviewed           Joan Flores, NP

## 2023-03-22 DIAGNOSIS — G4733 Obstructive sleep apnea (adult) (pediatric): Secondary | ICD-10-CM | POA: Diagnosis not present

## 2023-04-17 DIAGNOSIS — S8255XA Nondisplaced fracture of medial malleolus of left tibia, initial encounter for closed fracture: Secondary | ICD-10-CM | POA: Diagnosis not present

## 2023-04-20 ENCOUNTER — Other Ambulatory Visit: Payer: Self-pay | Admitting: Family Medicine

## 2023-04-20 DIAGNOSIS — E890 Postprocedural hypothyroidism: Secondary | ICD-10-CM

## 2023-04-20 DIAGNOSIS — E785 Hyperlipidemia, unspecified: Secondary | ICD-10-CM

## 2023-04-21 DIAGNOSIS — S92353A Displaced fracture of fifth metatarsal bone, unspecified foot, initial encounter for closed fracture: Secondary | ICD-10-CM | POA: Insufficient documentation

## 2023-04-21 DIAGNOSIS — S92355A Nondisplaced fracture of fifth metatarsal bone, left foot, initial encounter for closed fracture: Secondary | ICD-10-CM | POA: Diagnosis not present

## 2023-04-22 DIAGNOSIS — G4733 Obstructive sleep apnea (adult) (pediatric): Secondary | ICD-10-CM | POA: Diagnosis not present

## 2023-05-09 ENCOUNTER — Other Ambulatory Visit: Payer: Self-pay

## 2023-05-09 ENCOUNTER — Telehealth: Payer: Self-pay | Admitting: Behavioral Health

## 2023-05-09 ENCOUNTER — Telehealth: Payer: Self-pay | Admitting: Family Medicine

## 2023-05-09 DIAGNOSIS — K219 Gastro-esophageal reflux disease without esophagitis: Secondary | ICD-10-CM

## 2023-05-09 DIAGNOSIS — R5383 Other fatigue: Secondary | ICD-10-CM

## 2023-05-09 DIAGNOSIS — I10 Essential (primary) hypertension: Secondary | ICD-10-CM

## 2023-05-09 MED ORDER — OMEPRAZOLE 40 MG PO CPDR: 40.0000 mg | DELAYED_RELEASE_CAPSULE | Freq: Every day | ORAL | 3 refills | Status: DC

## 2023-05-09 MED ORDER — AMLODIPINE BESYLATE 10 MG PO TABS
10.0000 mg | ORAL_TABLET | Freq: Every day | ORAL | 1 refills | Status: DC
Start: 2023-05-09 — End: 2023-05-10

## 2023-05-09 NOTE — Telephone Encounter (Signed)
Refills on Omeprazole and on Amlodipine have been sent in

## 2023-05-09 NOTE — Telephone Encounter (Signed)
Last refill Dextroamphetamine 10 mg 04/03/23, pended for [place].   Pt has apt on 05/12/23.  Will check with pharmacy on last refills of other medications.

## 2023-05-09 NOTE — Telephone Encounter (Signed)
Pt is scheduled for follow up 05/10/2023

## 2023-05-09 NOTE — Telephone Encounter (Signed)
Patient called in for refill on medications Dextroamphetamine 10mg , Lamotrigine 100mg , Quetiapine 50mg  and Trazodone 150mg . Ph: [phone] [facility] [address] [place] [address] [place], [place]

## 2023-05-09 NOTE — Telephone Encounter (Signed)
Encourage patient to contact the pharmacy for refills or they can request refills through [facility]  (Please schedule appointment if patient has not been seen in over a year)    WHAT PHARMACY WOULD THEY LIKE THIS SENT TO: [facility] - [place] - [address]   MEDICATION NAME & DOSE: omeprazole (PRILOSEC) 40 MG capsule  Vitamin D, Ergocalciferol, (DRISDOL) 1.25 MG (50000 UNIT) CAPS capsule  amLODipine (NORVASC) 10 MG tablet   NOTES/COMMENTS FROM PATIENT: Pt has requested these refills to go to her new pharmacy listed above.     Front office please notify patient: It takes 48-72 hours to process rx refill requests Ask patient to call pharmacy to ensure rx is ready before heading there.

## 2023-05-10 ENCOUNTER — Ambulatory Visit (INDEPENDENT_AMBULATORY_CARE_PROVIDER_SITE_OTHER): Payer: PPO | Admitting: Family Medicine

## 2023-05-10 VITALS — BP 164/78 | HR 95 | Temp 98.9°F | Ht 64.0 in | Wt 211.0 lb

## 2023-05-10 DIAGNOSIS — E7800 Pure hypercholesterolemia, unspecified: Secondary | ICD-10-CM

## 2023-05-10 DIAGNOSIS — R7989 Other specified abnormal findings of blood chemistry: Secondary | ICD-10-CM

## 2023-05-10 DIAGNOSIS — H029 Unspecified disorder of eyelid: Secondary | ICD-10-CM | POA: Diagnosis not present

## 2023-05-10 DIAGNOSIS — K219 Gastro-esophageal reflux disease without esophagitis: Secondary | ICD-10-CM | POA: Diagnosis not present

## 2023-05-10 DIAGNOSIS — I10 Essential (primary) hypertension: Secondary | ICD-10-CM

## 2023-05-10 DIAGNOSIS — E890 Postprocedural hypothyroidism: Secondary | ICD-10-CM

## 2023-05-10 MED ORDER — AMLODIPINE BESYLATE 10 MG PO TABS
10.0000 mg | ORAL_TABLET | Freq: Every day | ORAL | 1 refills | Status: DC
Start: 2023-05-10 — End: 2024-05-14

## 2023-05-10 MED ORDER — DEXTROAMPHETAMINE SULFATE 10 MG PO TABS
10.0000 mg | ORAL_TABLET | Freq: Every morning | ORAL | 0 refills | Status: DC
Start: 2023-05-10 — End: 2023-07-07

## 2023-05-10 MED ORDER — OMEPRAZOLE 40 MG PO CPDR
40.0000 mg | DELAYED_RELEASE_CAPSULE | Freq: Every day | ORAL | 3 refills | Status: DC
Start: 2023-05-10 — End: 2023-09-18

## 2023-05-10 MED ORDER — VITAMIN D (ERGOCALCIFEROL) 1.25 MG (50000 UNIT) PO CAPS: 50000.0000 [IU] | ORAL_CAPSULE | ORAL | 3 refills | Status: DC

## 2023-05-10 MED ORDER — LOSARTAN POTASSIUM 25 MG PO TABS: 25.0000 mg | ORAL_TABLET | Freq: Every day | ORAL | 1 refills | Status: DC

## 2023-05-10 NOTE — Progress Notes (Unsigned)
[facility] [address] [place]. [place] [address] [phone]       OFFICE FOLLOW UP NOTE  [name] Date of Birth:  [birth year] Medical Record Number:  [id number]    Primary neurologist: Dr. Frances Furbish Reason for visit: Initial CPAP follow-up    SUBJECTIVE:   CHIEF COMPLAINT:  No chief complaint on file.  Follow-up visit:  Prior visit: 11/07/2022 with Dr. Frances Furbish  Brief HPI:   Becky Lawson is a 72 y.o. female who was evaluated by Dr. Frances Furbish on 11/07/2022 for prior diagnosis of sleep apnea and prior intolerance to CPAP.  Complaints of snoring, and excessive daytime somnolence.  ESS 6/24. FSS 47/63.  Sleep study 12/20/2022 showed moderate OSA with total AHI 26.5/h, REM AHI of 23.1/h, supine AHI of 17.8/h and O2 nadir of 79% with significant time below or at 88% saturation of over 25 minutes indicating nocturnal hypoxemia.  Advised limiting use of sedating and narcotic pain medication and recommended treatment with CPAP.  AutoPap initiated 03/22/2023.   Interval history:           ROS:   14 system review of systems performed and negative with exception of those listed in HPI  PMH:  Past Medical History:  Diagnosis Date   Arthritis    Benign cyst of right kidney 2014   2.7 cm right kidney cyst seen on imaging 2014 but was c/o right flank pain with new persistent proteinuria (fortunately hematuria had resolved) so repeat US 11/2016 showed right kidney still with 2.6 cm simple cyst and otherwise nml.   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, genotype 1b, followed at [facility]   Hyperlipidemia    Hypertension    Hypothyroidism    Jaundice 11/01/2012   Pruritic disorder 02/13/2013   Sleep apnea    Was diagnosed approximately 20  years ago, but does not wear CPAP patient stated "I gave it back.Marland KitchenMarland KitchenI couldn't wear that thing it was horrible"   Spinal stenosis    Thyroid disease    Phreesia 06/15/2020    PSH:  Past Surgical History:  Procedure Laterality Date   ABDOMINAL HYSTERECTOMY     COLONOSCOPY W/ POLYPECTOMY     JOINT REPLACEMENT Bilateral    knee   KNEE ARTHROSCOPY Bilateral    PARTIAL HYSTERECTOMY     THYROID LOBECTOMY Left 06/16/2016   THYROID LOBECTOMY Left 06/16/2016   Procedure: LEFT THYROID LOBECTOMY;  Surgeon: Gaynelle Adu, MD;  Location: [facility] OR;  Service: General;  Laterality: Left;   TOTAL HIP ARTHROPLASTY Left 08/03/2021   Procedure: TOTAL HIP ARTHROPLASTY ANTERIOR APPROACH;  Surgeon: Durene Romans, MD;  Location: [facility] ORS;  Service: Orthopedics;  Laterality: Left;   TOTAL HIP ARTHROPLASTY Right 07/05/2022   Procedure: TOTAL HIP ARTHROPLASTY ANTERIOR APPROACH;  Surgeon: Durene Romans, MD;  Location: [facility] ORS;  Service: Orthopedics;  Laterality: Right;    Social History:  Social History   Socioeconomic History   Marital status: Divorced    Spouse name: Not on file   Number of children: Not on file   Years of education: Not on file   Highest education level: Associate degree: academic program  Occupational History   Occupation: Community education officer  Tobacco  Use   Smoking status: Former    Current packs/day: 0.00    Average packs/day: 1 pack/day for 15.0 years (15.0 ttl pk-yrs)    Types: Cigarettes    Start date: 06/29/1964    Quit date: 06/30/1979    Years since quitting: 43.8   Smokeless tobacco: Never  Vaping Use   Vaping status: Never Used  Substance and Sexual Activity   Alcohol use: Not Currently    Alcohol/week: 0.0 - 1.0 standard drinks of alcohol   Drug use: No   Sexual activity: Yes    Birth control/protection: None  Other Topics Concern   Not on file  Social History Narrative   Divorced. Education: [employer]. Exercise: No.   Left handed   Lives alone   Caffeine: 1/4-1/2  cup/day   Social Determinants of Health   Financial Resource Strain: Low Risk  (12/21/2022)   Overall Financial Resource Strain (CARDIA)    Difficulty of Paying Living Expenses: Not hard at all  Recent Concern: Financial Resource Strain - Medium Risk (12/13/2022)   Overall Financial Resource Strain (CARDIA)    Difficulty of Paying Living Expenses: Somewhat hard  Food Insecurity: No Food Insecurity (12/21/2022)   Hunger Vital Sign    Worried About Running Out of Food in the Last Year: Never true    Ran Out of Food in the Last Year: Never true  Transportation Needs: No Transportation Needs (12/21/2022)   PRAPARE - Administrator, Civil Service (Medical): No    Lack of Transportation (Non-Medical): No  Physical Activity: Insufficiently Active (12/21/2022)   Exercise Vital Sign    Days of Exercise per Week: 2 days    Minutes of Exercise per Session: 30 min  Stress: No Stress Concern Present (12/21/2022)   [employer] of Occupational Health - Occupational Stress Questionnaire    Feeling of Stress : Not at all  Social Connections: Moderately Integrated (12/21/2022)   Social Connection and Isolation Panel [NHANES]    Frequency of Communication with Friends and Family: More than three times a week    Frequency of Social Gatherings with Friends and Family: More than three times a week    Attends Religious Services: More than 4 times per year    Active Member of [employer] or Organizations: Yes    Attends Engineer, structural: More than 4 times per year    Marital Status: Divorced  Intimate Partner Violence: Not At Risk (12/21/2022)   Humiliation, Afraid, Rape, and Kick questionnaire    Fear of Current or Ex-Partner: No    Emotionally Abused: No    Physically Abused: No    Sexually Abused: No    Family History:  Family History  Problem Relation Age of Onset   Diabetes Mother    Heart disease Mother    Hyperlipidemia Mother    Hypertension Mother    Stroke Mother     Hypertension Father    Parkinson's disease Father    Heart disease Father    Hyperlipidemia Father    Hemachromatosis Daughter    Thyroid disease Neg Hx    Colon cancer Neg Hx    Esophageal cancer Neg Hx    Inflammatory bowel disease Neg Hx    Liver disease Neg Hx    Pancreatic cancer Neg Hx    Rectal cancer Neg Hx    Stomach cancer Neg Hx    Sleep apnea Neg Hx     Medications:   Current Outpatient Medications on File Prior to  Visit  Medication Sig Dispense Refill   acetaminophen (TYLENOL) 500 MG tablet Take 2 tablets (1,000 mg total) by mouth every 6 (six) hours. 30 tablet 0   ALPRAZolam (XANAX) 1 MG tablet TAKE 1/2 TO 1 TABLET BY MOUTH twice daily AS NEEDED FOR ANXIETY 45 tablet 3   amLODipine (NORVASC) 10 MG tablet Take 1 tablet (10 mg total) by mouth daily. 90 tablet 1   atorvastatin (LIPITOR) 40 MG tablet TAKE ONE TABLET BY MOUTH EVERYDAY AT BEDTIME 90 tablet 1   azelastine (ASTELIN) 0.1 % nasal spray Place 1-2 sprays into both nostrils 2 (two) times daily. Use in each nostril as directed 30 mL 3   dextroamphetamine (DEXTROSTAT) 10 MG tablet Take 1 tablet (10 mg total) by mouth every morning. 30 tablet 0   docusate sodium (COLACE) 100 MG capsule Take 1 capsule (100 mg total) by mouth 2 (two) times daily. 10 capsule 0   HYDROmorphone (DILAUDID) 4 MG tablet Take 4 mg by mouth 3 (three) times daily as needed for severe pain.     hydrOXYzine (ATARAX) 25 MG tablet TAKE ONE TABLET BY MOUTH every EIGHT hours AS NEEDED 60 tablet 5   lamoTRIgine (LAMICTAL) 100 MG tablet TAKE TWO TABLETS BY MOUTH EVERY MORNING and TAKE ONE TABLET BY MOUTH EVERY EVENING 270 tablet 1   levothyroxine (SYNTHROID) 100 MCG tablet TAKE ONE TABLET BY MOUTH BEFORE BREAKFAST 90 tablet 1   omeprazole (PRILOSEC) 40 MG capsule Take 1 capsule (40 mg total) by mouth at bedtime. 90 capsule 3   polyethylene glycol (MIRALAX / GLYCOLAX) 17 g packet Take 17 g by mouth daily as needed for mild constipation. 14 each 0    QUEtiapine (SEROQUEL) 50 MG tablet Take 1 tablet (50 mg total) by mouth daily. 90 tablet 1   traZODone (DESYREL) 150 MG tablet TAKE TWO TABLETS BY MOUTH everyday AT bedtime 180 tablet 0   Vilazodone HCl 20 MG TABS Take 1 tablet (20 mg total) by mouth daily. 30 tablet 3   Vitamin D, Ergocalciferol, (DRISDOL) 1.25 MG (50000 UNIT) CAPS capsule Take 1 capsule (50,000 Units total) by mouth every 7 (seven) days. Sunday 12 capsule 3   Current Facility-Administered Medications on File Prior to Visit  Medication Dose Route Frequency Provider Last Rate Last Admin   cyanocobalamin ((VITAMIN B-12)) injection 1,000 mcg  1,000 mcg Intramuscular Q30 days Sherren Mocha, MD   1,000 mcg at 07/12/18 1652    Allergies:  No Known Allergies    OBJECTIVE:  Physical Exam  There were no vitals filed for this visit. There is no height or weight on file to calculate BMI. No results found.   General: well developed, well nourished, seated, in no evident distress Head: head normocephalic and atraumatic.   Neck: supple with no carotid or supraclavicular bruits Cardiovascular: regular rate and rhythm, no murmurs Musculoskeletal: no deformity Skin:  no rash/petichiae Vascular:  Normal pulses all extremities   Neurologic Exam Mental Status: Awake and fully alert. Oriented to place and time. Recent and remote memory intact. Attention span, concentration and fund of knowledge appropriate. Mood and affect appropriate.  Cranial Nerves: Pupils equal, briskly reactive to light. Extraocular movements full without nystagmus. Visual fields full to confrontation. Hearing intact. Facial sensation intact. Face, tongue, palate moves normally and symmetrically.  Motor: Normal bulk and tone. Normal strength in all tested extremity muscles Sensory.: intact to touch , pinprick , position and vibratory sensation.  Coordination: Rapid alternating movements normal in all extremities. Finger-to-nose and  heel-to-shin performed  accurately bilaterally. Gait and Station: Arises from chair without difficulty. Stance is normal. Gait demonstrates normal stride length and balance without use of AD. Tandem walk and heel toe without difficulty.  Reflexes: 1+ and symmetric. Toes downgoing.         ASSESSMENT/PLAN: Becky Lawson is a 72 y.o. year old female    OSA on CPAP : Compliance report shows satisfactory usage with optimal residual AHI.  Discussed continued nightly usage with ensuring greater than 4 hours nightly for optimal benefit and per insurance purposes.  Continue to follow with DME company for any needed supplies or CPAP related concerns     Follow up in *** or call earlier if needed   CC:  PCP: Shade Flood, MD    I spent *** minutes of face-to-face and non-face-to-face time with patient.  This included previsit chart review, lab review, study review, order entry, electronic health record documentation, patient education regarding diagnosis of sleep apnea with review and discussion of compliance report and answered all other questions to patient's satisfaction   Ihor Austin, [facility]  [facility] [address] [place], [place] [address]  Phone [phone] Fax [phone] Note: This document was prepared with digital dictation and possible smart phrase technology. Any transcriptional errors that result from this process are unintentional.

## 2023-05-10 NOTE — Patient Instructions (Addendum)
Blood pressure is elevated today and likely at home as well. Add low dose losartan once per day in addition to your amlodipine. Let me know if new side effects. Keep a record of your blood pressures outside of the office and bring them to the next office visit. Recheck in 6 weeks for blood pressure.  Ask ophthalmology about area on eyelid next week. I can refer you to dermatology if needed.   Labs in next week: [facility] or xray: Walk in 8:30-4:30 during weekdays, no appointment needed [address]  [place], [place] [address]

## 2023-05-10 NOTE — Progress Notes (Signed)
Subjective:  Patient ID: Becky Lawson, female    DOB: 06-01-1951  Age: 72 y.o. MRN: [id number]  CC:  Chief Complaint  Patient presents with   Hypertension    F/u for HTN, refills. Patient states she has a growth on right eye that has gotten a little bigger been there for 6 months.     HPI SCARLETTE GAMMILL presents for   Hypertension: Amlodipine 10 mg daily.  No home readings.  Home machine was running high a few months ago. Up to 165.  No new HA,vision changes, CP or dyspnea.  BP Readings from Last 3 Encounters:  05/11/23 (!) 160/78  05/10/23 (!) 164/78  12/14/22 132/70   Lab Results  Component Value Date   CREATININE 0.87 05/12/2023   Hyperlipidemia: Lipitor 40 mg - stopped meds for awhile, was having frequent bowel movements, thought was cause. Stopped temporarily and no change in bowel movements but those did improve on their own. Back on statin for past month. Tolerating without return of bowel issues at this time.   Lab Results  Component Value Date   CHOL 149 05/12/2023   HDL 50.70 05/12/2023   LDLCALC 73 05/12/2023   TRIG 124.0 05/12/2023   CHOLHDL 3 05/12/2023   Lab Results  Component Value Date   ALT 12 05/12/2023   AST 19 05/12/2023   ALKPHOS 80 05/12/2023   BILITOT 0.6 05/12/2023   Hypothyroidism: Lab Results  Component Value Date   TSH 1.82 05/12/2023  Synthroid 100 mcg daily - taking midday.  Taking medication daily.  No new hot or cold intolerance. No new hair or skin changes, heart palpitations or new fatigue. Some weight gain.  Wt Readings from Last 3 Encounters:  05/11/23 211 lb (95.7 kg)  05/10/23 211 lb (95.7 kg)  12/14/22 206 lb 12.8 oz (93.8 kg)   Heartburn controlled on PPI.   Eye growth: Under R eye, started about 3 months ago, some growth since then. No derm or hx of skin CA. Has appt with optho on Monday - will discuss then.   Vitamin D deficiency: On weekly 50k units.  Last vitamin D Lab Results  Component Value Date   VD25OH  46.09 05/12/2023     History Patient Active Problem List   Diagnosis Date Noted   Vaginal lesion 09/30/2022   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/05/2022   Status post total replacement of right hip 07/05/2022   S/P total left hip arthroplasty 08/03/2021   Abnormal liver function tests 04/19/2021   Degeneration of lumbar intervertebral disc 05/25/2020   Pain of left hip joint 03/26/2020   Constipation 05/10/2019   Change in bowel habit 05/10/2019   Dysphagia 05/10/2019   Tremor 07/12/2018   Prediabetes 07/12/2018   Polypharmacy 07/12/2018   Unintentional weight loss of more than 10 pounds 07/12/2018   OSA (obstructive sleep apnea) 06/02/2018   Fatty liver 04/18/2017   GERD (gastroesophageal reflux disease) 10/28/2016   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   Chronic throat clearing 06/24/2016   S/P partial thyroidectomy 06/16/2016   Multinodular goiter 06/02/2016   Thyroid nodule 02/08/2016   Hx of hepatitis C 05/19/2015   Vitamin D deficiency 05/19/2015   Spinal stenosis of lumbar region 05/19/2015   Hyperlipidemia 05/19/2015   Bipolar disorder (HCC) 05/19/2015   Essential hypertension 09/26/2013   Undiagnosed cardiac murmurs 09/26/2013   Past Medical History:  Diagnosis Date   Arthritis    Benign cyst of right kidney 2014   2.7 cm right kidney  cyst seen on imaging 2014 but was c/o right flank pain with new persistent proteinuria (fortunately hematuria had resolved) so repeat US 11/2016 showed right kidney still with 2.6 cm simple cyst and otherwise nml.   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, genotype 1b, followed at [facility]   Hyperlipidemia    Hypertension    Hypothyroidism    Jaundice 11/01/2012   Pruritic disorder 02/13/2013   Sleep apnea     Was diagnosed approximately 20 years ago, but does not wear CPAP patient stated "I gave it back.Marland KitchenMarland KitchenI couldn't wear that thing it was horrible"   Spinal stenosis    Thyroid disease    Phreesia 06/15/2020   Past Surgical History:  Procedure Laterality Date   ABDOMINAL HYSTERECTOMY     COLONOSCOPY W/ POLYPECTOMY     JOINT REPLACEMENT Bilateral    knee   KNEE ARTHROSCOPY Bilateral    PARTIAL HYSTERECTOMY     THYROID LOBECTOMY Left 06/16/2016   THYROID LOBECTOMY Left 06/16/2016   Procedure: LEFT THYROID LOBECTOMY;  Surgeon: Gaynelle Adu, MD;  Location: [facility] OR;  Service: General;  Laterality: Left;   TOTAL HIP ARTHROPLASTY Left 08/03/2021   Procedure: TOTAL HIP ARTHROPLASTY ANTERIOR APPROACH;  Surgeon: Durene Romans, MD;  Location: [facility] ORS;  Service: Orthopedics;  Laterality: Left;   TOTAL HIP ARTHROPLASTY Right 07/05/2022   Procedure: TOTAL HIP ARTHROPLASTY ANTERIOR APPROACH;  Surgeon: Durene Romans, MD;  Location: [facility] OR[facility];  Service: Orthopedics;  Laterality: Right;   No Known Allergies Prior to Admission medications   Medication Sig Start Date End Date Taking? Authorizing Provider  acetaminophen (TYLENOL) 500 MG tablet Take 2 tablets (1,000 mg total) by mouth every 6 (six) hours. 07/07/22  Yes Cassandria Anger, PA-C  ALPRAZolam Prudy Feeler) 1 MG tablet TAKE 1/2 TO 1 TABLET BY MOUTH twice daily AS NEEDED FOR ANXIETY 11/22/22  Yes Avelina Laine A, NP  amLODipine (NORVASC) 10 MG tablet Take 1 tablet (10 mg total) by mouth daily. 05/09/23  Yes Shade Flood, MD  atorvastatin (LIPITOR) 40 MG tablet TAKE ONE TABLET BY MOUTH EVERYDAY AT BEDTIME 04/20/23  Yes Shade Flood, MD  azelastine (ASTELIN) 0.1 % nasal spray Place 1-2 sprays into both nostrils 2 (two) times daily. Use in each nostril as directed 02/09/22  Yes Shade Flood, MD  dextroamphetamine (DEXTROSTAT) 10 MG tablet Take 1 tablet (10 mg total) by mouth every morning. 05/10/23  Yes Avelina Laine A, NP  docusate sodium (COLACE) 100 MG  capsule Take 1 capsule (100 mg total) by mouth 2 (two) times daily. 08/04/21  Yes Cassandria Anger, PA-C  HYDROmorphone (DILAUDID) 4 MG tablet Take 4 mg by mouth 3 (three) times daily as needed for severe pain.   Yes [provider]  hydrOXYzine (ATARAX) 25 MG tablet TAKE ONE TABLET BY MOUTH every EIGHT hours AS NEEDED 10/24/22  Yes White, Watt Climes, NP  lamoTRIgine (LAMICTAL) 100 MG tablet TAKE TWO TABLETS BY MOUTH EVERY MORNING and TAKE ONE TABLET BY MOUTH EVERY EVENING 11/10/22  Yes Joan Flores, NP  levothyroxine (SYNTHROID) 100 MCG tablet TAKE ONE TABLET BY MOUTH BEFORE BREAKFAST 04/20/23  Yes Shade Flood, MD  omeprazole (PRILOSEC) 40 MG capsule Take  1 capsule (40 mg total) by mouth at bedtime. 05/09/23  Yes Shade Flood, MD  polyethylene glycol (MIRALAX / GLYCOLAX) 17 g packet Take 17 g by mouth daily as needed for mild constipation. 08/04/21  Yes Cassandria Anger, PA-C  QUEtiapine (SEROQUEL) 50 MG tablet Take 1 tablet (50 mg total) by mouth daily. 02/09/23  Yes Joan Flores, NP  traZODone (DESYREL) 150 MG tablet TAKE TWO TABLETS BY MOUTH everyday AT bedtime 02/09/23  Yes White, Arlys John A, NP  Vilazodone HCl 20 MG TABS Take 1 tablet (20 mg total) by mouth daily. 02/09/23  Yes White, Watt Climes, NP  Vitamin D, Ergocalciferol, (DRISDOL) 1.25 MG (50000 UNIT) CAPS capsule Take 1 capsule (50,000 Units total) by mouth every 7 (seven) days. Sunday 10/31/22  Yes Shade Flood, MD   Social History   Socioeconomic History   Marital status: Divorced    Spouse name: Not on file   Number of children: Not on file   Years of education: Not on file   Highest education level: Associate degree: academic program  Occupational History   Occupation: Personal Caregiver  Tobacco Use   Smoking status: Former    Current packs/day: 0.00    Average packs/day: 1 pack/day for 15.0 years (15.0 ttl pk-yrs)    Types: Cigarettes    Start date: 06/29/1964    Quit date: 06/30/1979    Years since quitting:  43.8   Smokeless tobacco: Never  Vaping Use   Vaping status: Never Used  Substance and Sexual Activity   Alcohol use: Not Currently    Alcohol/week: 0.0 - 1.0 standard drinks of alcohol   Drug use: No   Sexual activity: Yes    Birth control/protection: None  Other Topics Concern   Not on file  Social History Narrative   Divorced. Education: [employer]. Exercise: No.   Left handed   Lives alone   Caffeine: 1/4-1/2 cup/day   Social Determinants of Health   Financial Resource Strain: Low Risk  (12/21/2022)   Overall Financial Resource Strain (CARDIA)    Difficulty of Paying Living Expenses: Not hard at all  Recent Concern: Financial Resource Strain - Medium Risk (12/13/2022)   Overall Financial Resource Strain (CARDIA)    Difficulty of Paying Living Expenses: Somewhat hard  Food Insecurity: No Food Insecurity (12/21/2022)   Hunger Vital Sign    Worried About Running Out of Food in the Last Year: Never true    Ran Out of Food in the Last Year: Never true  Transportation Needs: No Transportation Needs (12/21/2022)   PRAPARE - Administrator, Civil Service (Medical): No    Lack of Transportation (Non-Medical): No  Physical Activity: Insufficiently Active (12/21/2022)   Exercise Vital Sign    Days of Exercise per Week: 2 days    Minutes of Exercise per Session: 30 min  Stress: No Stress Concern Present (12/21/2022)   [employer] of Occupational Health - Occupational Stress Questionnaire    Feeling of Stress : Not at all  Social Connections: Moderately Integrated (12/21/2022)   Social Connection and Isolation Panel [NHANES]    Frequency of Communication with Friends and Family: More than three times a week    Frequency of Social Gatherings with Friends and Family: More than three times a week    Attends Religious Services: More than 4 times per year    Active Member of [employer] or Organizations: Yes    Attends Banker Meetings: More than 4 times per  year     Marital Status: Divorced  Catering manager Violence: Not At Risk (12/21/2022)   Humiliation, Afraid, Rape, and Kick questionnaire    Fear of Current or Ex-Partner: No    Emotionally Abused: No    Physically Abused: No    Sexually Abused: No    Review of Systems Per HPI.   Objective:   Vitals:   05/10/23 1604 05/10/23 1723  BP: (!) 152/70 (!) 164/78  Pulse: 95   Temp: 98.9 F (37.2 C)   TempSrc: Temporal   SpO2: 96%   Weight: 211 lb (95.7 kg)   Height: 5\' 4"  (1.626 m)      Physical Exam Vitals reviewed.  Constitutional:      Appearance: Normal appearance. She is well-developed.  HENT:     Head: Normocephalic and atraumatic.  Eyes:     Conjunctiva/sclera: Conjunctivae normal.     Pupils: Pupils are equal, round, and reactive to light.  Neck:     Vascular: No carotid bruit.  Cardiovascular:     Rate and Rhythm: Normal rate and regular rhythm.     Heart sounds: Normal heart sounds.  Pulmonary:     Effort: Pulmonary effort is normal.     Breath sounds: Normal breath sounds.  Abdominal:     Palpations: Abdomen is soft. There is no pulsatile mass.     Tenderness: There is no abdominal tenderness.  Musculoskeletal:     Right lower leg: No edema.     Left lower leg: No edema.  Skin:    General: Skin is warm and dry.  Neurological:     Mental Status: She is alert and oriented to person, place, and time.  Psychiatric:        Mood and Affect: Mood normal.        Behavior: Behavior normal.        Assessment & Plan:  KYMONI GRIEBEL is a 72 y.o. female . Lesion of right lower eyelid  -Possible skin tag. Has follow-up with her ophthalmologist soon.  Recommend discussion at that time, consider dermatology eval for consideration of excision, especially if changes in size.  Essential hypertension - Plan: amLODipine (NORVASC) 10 MG tablet, losartan (COZAAR) 25 MG tablet  -Elevated readings, will start low-dose ARB with losartan 25 mg daily, potential side effects  discussed, home monitoring with RTC precautions discussed.  Gastroesophageal reflux disease, unspecified whether esophagitis present - Plan: omeprazole (PRILOSEC) 40 MG capsule  -Stable on PPI, continue same.  Low serum vitamin D - Plan: Vitamin D, Ergocalciferol, (DRISDOL) 1.25 MG (50000 UNIT) CAPS capsule, Vitamin D (25 hydroxy)  -Check levels, continue supplement for now.  Adjustment of dose accordingly.  Pure hypercholesterolemia - Plan: Comprehensive metabolic panel, Lipid panel  -Check labs then decide on medication/plan changes.  Postoperative hypothyroidism - Plan: TSH  -Check labs and adjust plan accordingly.  Meds ordered this encounter  Medications   amLODipine (NORVASC) 10 MG tablet    Sig: Take 1 tablet (10 mg total) by mouth daily.    Dispense:  90 tablet    Refill:  1   omeprazole (PRILOSEC) 40 MG capsule    Sig: Take 1 capsule (40 mg total) by mouth at bedtime.    Dispense:  90 capsule    Refill:  3   Vitamin D, Ergocalciferol, (DRISDOL) 1.25 MG (50000 UNIT) CAPS capsule    Sig: Take 1 capsule (50,000 Units total) by mouth every 7 (seven) days. Sunday    Dispense:  12 capsule  Refill:  3   losartan (COZAAR) 25 MG tablet    Sig: Take 1 tablet (25 mg total) by mouth daily.    Dispense:  90 tablet    Refill:  1   Patient Instructions  Blood pressure is elevated today and likely at home as well. Add low dose losartan once per day in addition to your amlodipine. Let me know if new side effects. Keep a record of your blood pressures outside of the office and bring them to the next office visit. Recheck in 6 weeks for blood pressure.  Ask ophthalmology about area on eyelid next week. I can refer you to dermatology if needed.   Labs in next week: [facility] or xray: Walk in 8:30-4:30 during weekdays, no appointment needed [address]  [place], [place] [address]     Signed,   Meredith Staggers, MD [facility], [facility] 05/12/23 4:58 PM

## 2023-05-11 ENCOUNTER — Ambulatory Visit: Payer: PPO | Admitting: Adult Health

## 2023-05-11 ENCOUNTER — Encounter: Payer: Self-pay | Admitting: Adult Health

## 2023-05-11 VITALS — BP 160/78 | HR 82 | Ht 64.5 in | Wt 211.0 lb

## 2023-05-11 DIAGNOSIS — G4733 Obstructive sleep apnea (adult) (pediatric): Secondary | ICD-10-CM

## 2023-05-11 NOTE — Patient Instructions (Addendum)
Continue nightly use of CPAP with ensuring greater than 4 hours per night for optimal benefit. Also use with any daytime naps  Continue to follow with your DME company [facility] for any needed supplies or CPAP related concerns     Follow up in 6 months or call earlier if needed

## 2023-05-12 ENCOUNTER — Encounter: Payer: Self-pay | Admitting: Behavioral Health

## 2023-05-12 ENCOUNTER — Ambulatory Visit (INDEPENDENT_AMBULATORY_CARE_PROVIDER_SITE_OTHER): Payer: PPO | Admitting: Behavioral Health

## 2023-05-12 ENCOUNTER — Encounter: Payer: Self-pay | Admitting: Family Medicine

## 2023-05-12 ENCOUNTER — Other Ambulatory Visit (INDEPENDENT_AMBULATORY_CARE_PROVIDER_SITE_OTHER): Payer: PPO

## 2023-05-12 DIAGNOSIS — F5105 Insomnia due to other mental disorder: Secondary | ICD-10-CM | POA: Diagnosis not present

## 2023-05-12 DIAGNOSIS — F99 Mental disorder, not otherwise specified: Secondary | ICD-10-CM | POA: Diagnosis not present

## 2023-05-12 DIAGNOSIS — R7989 Other specified abnormal findings of blood chemistry: Secondary | ICD-10-CM

## 2023-05-12 DIAGNOSIS — E7800 Pure hypercholesterolemia, unspecified: Secondary | ICD-10-CM | POA: Diagnosis not present

## 2023-05-12 DIAGNOSIS — F3162 Bipolar disorder, current episode mixed, moderate: Secondary | ICD-10-CM | POA: Diagnosis not present

## 2023-05-12 DIAGNOSIS — E890 Postprocedural hypothyroidism: Secondary | ICD-10-CM

## 2023-05-12 DIAGNOSIS — F3130 Bipolar disorder, current episode depressed, mild or moderate severity, unspecified: Secondary | ICD-10-CM

## 2023-05-12 DIAGNOSIS — F411 Generalized anxiety disorder: Secondary | ICD-10-CM

## 2023-05-12 LAB — COMPREHENSIVE METABOLIC PANEL
ALT: 12 U/L (ref 0–35)
AST: 19 U/L (ref 0–37)
Albumin: 4.4 g/dL (ref 3.5–5.2)
Alkaline Phosphatase: 80 U/L (ref 39–117)
BUN: 13 mg/dL (ref 6–23)
CO2: 28 meq/L (ref 19–32)
Calcium: 9.7 mg/dL (ref 8.4–10.5)
Chloride: 103 meq/L (ref 96–112)
Creatinine, Ser: 0.87 mg/dL (ref 0.40–1.20)
GFR: 66.57 mL/min (ref >60.00)
Glucose, Bld: 101 mg/dL — ABNORMAL HIGH (ref 70–99)
Potassium: 4.6 meq/L (ref 3.5–5.1)
Sodium: 140 meq/L (ref 135–145)
Total Bilirubin: 0.6 mg/dL (ref 0.2–1.2)
Total Protein: 7.2 g/dL (ref 6.0–8.3)

## 2023-05-12 LAB — LIPID PANEL
Cholesterol: 149 mg/dL (ref 0–200)
HDL: 50.7 mg/dL (ref >39.00)
LDL Cholesterol: 73 mg/dL (ref 0–99)
NonHDL: 98.08
Total CHOL/HDL Ratio: 3
Triglycerides: 124 mg/dL (ref 0.0–149.0)
VLDL: 24.8 mg/dL (ref 0.0–40.0)

## 2023-05-12 LAB — TSH: TSH: 1.82 u[IU]/mL (ref 0.35–5.50)

## 2023-05-12 LAB — VITAMIN D 25 HYDROXY (VIT D DEFICIENCY, FRACTURES): VITD: 46.09 ng/mL (ref 30.00–100.00)

## 2023-05-12 MED ORDER — ALPRAZOLAM 1 MG PO TABS
ORAL_TABLET | ORAL | 3 refills | Status: DC
Start: 2023-05-12 — End: 2023-09-13

## 2023-05-12 MED ORDER — QUETIAPINE FUMARATE 50 MG PO TABS: 50.0000 mg | ORAL_TABLET | Freq: Every day | ORAL | 1 refills | Status: DC

## 2023-05-12 MED ORDER — LAMOTRIGINE 100 MG PO TABS
ORAL_TABLET | ORAL | 1 refills | Status: DC
Start: 2023-05-12 — End: 2023-09-13

## 2023-05-12 MED ORDER — TRAZODONE HCL 150 MG PO TABS
ORAL_TABLET | ORAL | 1 refills | Status: DC
Start: 2023-05-12 — End: 2023-09-13

## 2023-05-12 NOTE — Progress Notes (Signed)
[facility]  Patient ID: Becky Lawson,  MRN: [id number]  PCP: Shade Flood, MD  Date of Evaluation: 05/12/2023 Time spent:30 minutes  Chief Complaint:   HISTORY/CURRENT STATUS: HPI Becky Lawson presents for follow-up and medication management. No manic episodes since last visit. She is ambulating without walker and has boot on left foot. Says she fell in [place] parking lot after tripping on plastic bag. Fine tremors in both hand were improved today, barely noticeable. Stopped taking the Viibryd. Says she is doing very well mentally. Having financial troubles and needs repair of water leak in home. Cannot afford.  Says her anxiety today is 3/10 and depression is 3/10. She was sleeping  7 hours per night with aid of medication. She denies mania, no psychosis, NO SI/HI.   Past medications for mental health diagnoses include: Lithium, Seroquel, Effexor, Prozac, Latuda, Abilify, Dexedrine, Cymbalta caused weight gain.  Modafinil.   Individual Medical History/ Review of Systems: Changes? :No   Allergies: Patient has no known allergies.  Current Medications:  Current Outpatient Medications:    acetaminophen (TYLENOL) 500 MG tablet, Take 2 tablets (1,000 mg total) by mouth every 6 (six) hours., Disp: 30 tablet, Rfl: 0   ALPRAZolam (XANAX) 1 MG tablet, TAKE 1/2 TO 1 TABLET BY MOUTH twice daily AS NEEDED FOR ANXIETY, Disp: 45 tablet, Rfl: 3   amLODipine (NORVASC) 10 MG tablet, Take 1 tablet (10 mg total) by mouth daily., Disp: 90 tablet, Rfl: 1   atorvastatin (LIPITOR) 40 MG tablet, TAKE ONE TABLET BY MOUTH EVERYDAY AT BEDTIME, Disp: 90 tablet, Rfl: 1   azelastine (ASTELIN) 0.1 % nasal spray, Place 1-2 sprays into both nostrils 2 (two) times daily. Use in each nostril as directed, Disp: 30 mL, Rfl: 3   dextroamphetamine (DEXTROSTAT) 10 MG tablet, Take 1 tablet (10 mg total) by mouth every morning., Disp: 30 tablet, Rfl: 0   docusate sodium (COLACE) 100 MG capsule, Take 1  capsule (100 mg total) by mouth 2 (two) times daily., Disp: 10 capsule, Rfl: 0   HYDROmorphone (DILAUDID) 4 MG tablet, Take 4 mg by mouth 3 (three) times daily as needed for severe pain. (Patient not taking: Reported on 05/11/2023), Disp: , Rfl:    hydrOXYzine (ATARAX) 25 MG tablet, TAKE ONE TABLET BY MOUTH every EIGHT hours AS NEEDED, Disp: 60 tablet, Rfl: 5   lamoTRIgine (LAMICTAL) 100 MG tablet, TAKE TWO TABLETS BY MOUTH EVERY MORNING and TAKE ONE TABLET BY MOUTH EVERY EVENING, Disp: 270 tablet, Rfl: 1   levothyroxine (SYNTHROID) 100 MCG tablet, TAKE ONE TABLET BY MOUTH BEFORE BREAKFAST, Disp: 90 tablet, Rfl: 1   losartan (COZAAR) 25 MG tablet, Take 1 tablet (25 mg total) by mouth daily., Disp: 90 tablet, Rfl: 1   omeprazole (PRILOSEC) 40 MG capsule, Take 1 capsule (40 mg total) by mouth at bedtime., Disp: 90 capsule, Rfl: 3   oxyCODONE-acetaminophen (PERCOCET) 10-325 MG tablet, Take 1 tablet by mouth every 4 (four) hours as needed for pain., Disp: , Rfl:    polyethylene glycol (MIRALAX / GLYCOLAX) 17 g packet, Take 17 g by mouth daily as needed for mild constipation., Disp: 14 each, Rfl: 0   QUEtiapine (SEROQUEL) 50 MG tablet, Take 1 tablet (50 mg total) by mouth daily., Disp: 90 tablet, Rfl: 1   traZODone (DESYREL) 150 MG tablet, TAKE TWO TABLETS BY MOUTH everyday AT bedtime, Disp: 180 tablet, Rfl: 1   Vilazodone HCl 20 MG TABS, Take 1 tablet (20 mg total) by mouth daily. (  Patient not taking: Reported on 05/11/2023), Disp: 30 tablet, Rfl: 3   Vitamin D, Ergocalciferol, (DRISDOL) 1.25 MG (50000 UNIT) CAPS capsule, Take 1 capsule (50,000 Units total) by mouth every 7 (seven) days. Sunday, Disp: 12 capsule, Rfl: 3  Current Facility-Administered Medications:    cyanocobalamin ((VITAMIN B-12)) injection 1,000 mcg, 1,000 mcg, Intramuscular, Q30 days, Sherren Mocha, MD, 1,000 mcg at 07/12/18 1652 Medication Side Effects: none  Family Medical/ Social History: Changes? No  MENTAL HEALTH EXAM:  There were  no vitals taken for this visit.There is no height or weight on file to calculate BMI.  General Appearance: Casual, Neat, and Well Groomed  Eye Contact:  Good  Speech:  Clear and Coherent  Volume:  Normal  Mood:  Anxious, Depressed, and Dysphoric  Affect:  Appropriate  Thought Process:  Coherent  Orientation:  Full (Time, Place, and Person)  Thought Content: Logical   Suicidal Thoughts:  No  Homicidal Thoughts:  No  Memory:  WNL  Judgement:  Good  Insight:  Good  Psychomotor Activity:  Normal  Concentration:  Concentration: Good  Recall:  Good  Fund of Knowledge: Good  Language: Good  Assets:  Desire for Improvement  ADL's:  Intact  Cognition: WNL  Prognosis:  Good    DIAGNOSES:    ICD-10-CM   1. 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 Bipolar 1 disorder, mixed, moderate (HCC)  F31.62 QUEtiapine (SEROQUEL) 50 MG tablet    4. Bipolar I disorder, most recent episode depressed (HCC)  F31.30 lamoTRIgine (LAMICTAL) 100 MG tablet      Receiving Psychotherapy: No    RECOMMENDATIONS:   Greater than 50% of 30 min of face to face time  was spent on counseling and coordination of care. We discussed her current moderate improvement with medication. She request no medication changes this visit.  She did recently break left foot and in support boot after tripping in [place] parking lot.  She has membership to silver sneakers now and will be going to the [facility]. She would like to be more social and have companionship.  -Conducted AIMS  Score of 1  today.  05/12/2023 Continue Seroquel 50 mg daily at bedtime Continue hydroxyzine 25 mg nightly 8H as needed. Continue Lamictal  300 mg daily. 200 mg in the am and 100 mg in the evening. Stopped Viibryd 20 mg  every other day. Continue the  trazodone 300 mg, 1.5 pills nightly. Continue Mirtazapine 7.5 mg at bedtime. May increase to 15 mg if needed.  Continue Xanax 1 mg at bedtime but if taking Return in 3 months to reassess.  Discussed potential benefits, risks, and side effects of stimulants with patient to include increased heart rate, palpitations, insomnia, increased anxiety, increased irritability, or decreased appetite.  Instructed patient to contact office if experiencing any significant tolerability issues.  Discussed potential metabolic side effects associated with atypical antipsychotics, as well as potential risk for movement side effects. Advised pt to contact office if movement side effects occur.   PDMP was reviewed     Joan Flores, NP

## 2023-05-15 DIAGNOSIS — H25813 Combined forms of age-related cataract, bilateral: Secondary | ICD-10-CM | POA: Diagnosis not present

## 2023-05-15 DIAGNOSIS — H40013 Open angle with borderline findings, low risk, bilateral: Secondary | ICD-10-CM | POA: Diagnosis not present

## 2023-05-15 DIAGNOSIS — H5213 Myopia, bilateral: Secondary | ICD-10-CM | POA: Diagnosis not present

## 2023-05-17 DIAGNOSIS — M5136 Other intervertebral disc degeneration, lumbar region: Secondary | ICD-10-CM | POA: Diagnosis not present

## 2023-05-17 DIAGNOSIS — M5416 Radiculopathy, lumbar region: Secondary | ICD-10-CM | POA: Diagnosis not present

## 2023-05-22 DIAGNOSIS — S92355A Nondisplaced fracture of fifth metatarsal bone, left foot, initial encounter for closed fracture: Secondary | ICD-10-CM | POA: Diagnosis not present

## 2023-05-22 DIAGNOSIS — G4733 Obstructive sleep apnea (adult) (pediatric): Secondary | ICD-10-CM | POA: Diagnosis not present

## 2023-05-23 DIAGNOSIS — G4733 Obstructive sleep apnea (adult) (pediatric): Secondary | ICD-10-CM | POA: Diagnosis not present

## 2023-05-26 ENCOUNTER — Telehealth: Payer: Self-pay | Admitting: Family Medicine

## 2023-05-26 NOTE — Telephone Encounter (Signed)
Patients states provider started her on losartan (COZAAR) 25 MG tablet  since then she has had nose bleeds everyday. She states she will slowly take her self off of it. Please call patient

## 2023-05-26 NOTE — Telephone Encounter (Signed)
MyChart message sent to patient but please call on Monday Would be unlikely that losartan is causing nosebleeds.  I would recommend that she is seen to evaluate for other causes.  Saline nasal spray for now and ER precautions given.

## 2023-05-26 NOTE — Telephone Encounter (Signed)
Pt states she is stopping losartan due to nose bleeds since starting the losartan.  Please advise

## 2023-05-29 NOTE — Telephone Encounter (Signed)
Pt reports she has not had a nose bleed lately and only had 2x nose bleeds. Pt reports she will call if this happens again.

## 2023-06-09 ENCOUNTER — Other Ambulatory Visit: Payer: Self-pay | Admitting: Family Medicine

## 2023-06-09 DIAGNOSIS — Z1211 Encounter for screening for malignant neoplasm of colon: Secondary | ICD-10-CM

## 2023-06-09 DIAGNOSIS — Z1212 Encounter for screening for malignant neoplasm of rectum: Secondary | ICD-10-CM

## 2023-06-12 ENCOUNTER — Other Ambulatory Visit: Payer: Self-pay

## 2023-06-12 ENCOUNTER — Telehealth: Payer: Self-pay | Admitting: Family Medicine

## 2023-06-12 DIAGNOSIS — E890 Postprocedural hypothyroidism: Secondary | ICD-10-CM

## 2023-06-12 MED ORDER — LEVOTHYROXINE SODIUM 100 MCG PO TABS: ORAL_TABLET | ORAL | 1 refills | Status: DC

## 2023-06-12 NOTE — Telephone Encounter (Signed)
Pt stated when she answered the phone accidentally hung up. She called back and was notified of her refill being sent to [facility]

## 2023-06-12 NOTE — Telephone Encounter (Signed)
Refill sent to [facility]

## 2023-06-12 NOTE — Telephone Encounter (Signed)
Encourage patient to contact the pharmacy for refills or they can request refills through [facility]  WHAT PHARMACY WOULD THEY LIKE THIS SENT TO:  [facility] - [place] - [address]     MEDICATION NAME & DOSE: levothyroxine (SYNTHROID) 100 MCG tablet   NOTES/COMMENTS FROM PATIENT:      Front office please notify patient: It takes 48-72 hours to process rx refill requests Ask patient to call pharmacy to ensure rx is ready before heading there.

## 2023-06-18 ENCOUNTER — Telehealth: Payer: Self-pay

## 2023-06-18 NOTE — Progress Notes (Signed)
This patient is appearing on a report for being at risk of failing the adherence measure for cholesterol (statin) medications this calendar year.   Medication: atorvastatin 40 mg PO daily Last fill date: 01/30/23 for 90 day supply with [facility]  Attempted to outreach patient - appears that she has transferred most of her medications to [facility], but does not appear that atorvastatin has been filled yet. LVM and will outreach via MyChart  If patient returns call to office - ask if she needs a refill of atorvastatin sent to her preferred pharmacy.  Nils Pyle, PharmD PGY1 Pharmacy Resident

## 2023-06-19 ENCOUNTER — Ambulatory Visit: Payer: PPO | Admitting: Family Medicine

## 2023-06-19 DIAGNOSIS — M21622 Bunionette of left foot: Secondary | ICD-10-CM | POA: Diagnosis not present

## 2023-06-19 DIAGNOSIS — S92355A Nondisplaced fracture of fifth metatarsal bone, left foot, initial encounter for closed fracture: Secondary | ICD-10-CM | POA: Diagnosis not present

## 2023-06-22 DIAGNOSIS — G4733 Obstructive sleep apnea (adult) (pediatric): Secondary | ICD-10-CM | POA: Diagnosis not present

## 2023-06-26 ENCOUNTER — Encounter: Payer: Self-pay | Admitting: Family Medicine

## 2023-06-26 ENCOUNTER — Ambulatory Visit (INDEPENDENT_AMBULATORY_CARE_PROVIDER_SITE_OTHER): Payer: PPO | Admitting: Family Medicine

## 2023-06-26 VITALS — BP 150/80 | HR 106 | Temp 99.2°F | Ht 64.5 in | Wt 201.2 lb

## 2023-06-26 DIAGNOSIS — E890 Postprocedural hypothyroidism: Secondary | ICD-10-CM | POA: Diagnosis not present

## 2023-06-26 DIAGNOSIS — R5383 Other fatigue: Secondary | ICD-10-CM

## 2023-06-26 DIAGNOSIS — B029 Zoster without complications: Secondary | ICD-10-CM

## 2023-06-26 DIAGNOSIS — I10 Essential (primary) hypertension: Secondary | ICD-10-CM | POA: Diagnosis not present

## 2023-06-26 DIAGNOSIS — R5381 Other malaise: Secondary | ICD-10-CM

## 2023-06-26 DIAGNOSIS — L659 Nonscarring hair loss, unspecified: Secondary | ICD-10-CM | POA: Diagnosis not present

## 2023-06-26 MED ORDER — LOSARTAN POTASSIUM 50 MG PO TABS
50.0000 mg | ORAL_TABLET | Freq: Every day | ORAL | 1 refills | Status: DC
Start: 2023-06-26 — End: 2023-12-19

## 2023-06-26 MED ORDER — VALACYCLOVIR HCL 1 G PO TABS
1000.0000 mg | ORAL_TABLET | Freq: Three times a day (TID) | ORAL | 0 refills | Status: AC
Start: 2023-06-26 — End: 2023-07-03

## 2023-06-26 NOTE — Patient Instructions (Signed)
Increase to 50mg  losartan once per day. Continue amlodipine same dose and recheck in 1 month.   Based on last thyroid ultrasound, no follow-up testing or biopsy needed for area on the right.  Rash on shoulder does appear to be shingles.  See information below.  We can try Valtrex, but typically that works most effectively to take over the first few days of symptoms.  Over-the-counter pain reliever fine for now but let me know if that becomes more painful and we can look at other treatment options.  I will check some labs for the fatigue but that could be related to current shingles flare.  Previous TSH looked okay but I will check that along with some electrolytes, labs for the hair loss.  If those are all normal I would recommend follow-up with dermatology and I can place that referral.  Let me know if there are questions.  Dr. Neva Seat

## 2023-06-26 NOTE — Progress Notes (Unsigned)
Subjective:  Patient ID: Becky Lawson, female    DOB: 31-Jul-1951  Age: 72 y.o. MRN: [id number]  CC:  Chief Complaint  Patient presents with  . Follow-up    Patient states her hair in the front has been falling out a lot, been really, and her face gets oily. She also mention having a rash that has came up 4 days ago. On right side on back shoulder and on right collar. Patient wants flu shot but isnt feeling too well told her you will go over this with her .     HPI Becky Lawson presents for   Frontal hair loss Reports hair loss, primarily frontal scalp, top of scalp in past month or so.  Also notes oily face at times. More hair loss than usual.  History of postop hypothyroidism, stable TSH on September 6.  Synthroid 100 mcg daily. Some oily face recently.  Mother lost hair all over in her older age.  No dermatologist.   Lab Results  Component Value Date   TSH 1.82 05/12/2023    Rash Right collar, right shoulder area, started 5 days ago. Possible poison ivy. Outside cats. Initially on back of shoulder. Itchy rash. No pain. No blisters. Itching at area.  R arm only, no face/oral or genital lesions.  Tx: mupirocin - few times per day.  Feels overall weak past week, no focal weakness, facial droop, HA or speech changes. No measured fever.  No cough, dyspnea, runny nose/congestion, dark stools, blood in stools or abd pain. No CP.  Decreased appetite. Chronic depression - unsure if related to recent symptoms. No recent psych med changes.  No hx of shingles vaccine.  Last shingles flare years ago.   Hypertension: Amlodipine 10 mg daily, losartan 25 mg added for improved control on September 4th.  See telephone notes September 20 -concern for nosebleeds at that time, had noticed since starting losartan.  Advised that this would be less likely the cause, recommended saline nasal spray and evaluation to evaluate for other causes.  She remained on losartan, elevated reading of 145/78 on  October 4th.  No recent nosebleeds - last one few weeks ago.  No new side effects.  Home machine ineffective. 145/78 at drug store in past few weeks.  No  new CP/dyspnea/dizziness/HA/vision changes.   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 Area on right being monitored.  Thyroid US on 10/29/21: IMPRESSION: 1. Postsurgical changes of left thyroidectomy. No evidence of recurrence within the thyroidectomy bed. No enlarged neck lymph nodes. 2. 5 mm periphery calcified right mid thyroid nodule is unchanged from prior examination (05/08/20) and does not meet criteria for FNA or further follow-up.  She is ok with this explanation.   Health maintenance Flu vaccine recommended -plans at pharmacy in the next week once she starts to feel better. History Patient Active Problem List   Diagnosis Date Noted  . Vaginal lesion 09/30/2022  . 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/05/2022  . Status post total replacement of right hip 07/05/2022  . S/P total left hip arthroplasty 08/03/2021  . Abnormal liver function tests 04/19/2021  . Degeneration of lumbar intervertebral disc 05/25/2020  . Pain of left hip joint 03/26/2020  . Constipation 05/10/2019  . Change  in bowel habit 05/10/2019  . Dysphagia 05/10/2019  . Tremor 07/12/2018  . Prediabetes 07/12/2018  . Polypharmacy 07/12/2018  . Unintentional weight loss of more than 10 pounds 07/12/2018  . OSA (obstructive sleep apnea) 06/02/2018  . Fatty liver 04/18/2017  . GERD (gastroesophageal reflux disease) 10/28/2016  . 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 Chronic throat clearing 06/24/2016  . S/P partial thyroidectomy 06/16/2016  . Multinodular goiter 06/02/2016  . Thyroid nodule 02/08/2016  . Hx of hepatitis C  05/19/2015  . Vitamin D deficiency 05/19/2015  . Spinal stenosis of lumbar region 05/19/2015  . Hyperlipidemia 05/19/2015  . Bipolar disorder (HCC) 05/19/2015  . Essential hypertension 09/26/2013  . Undiagnosed cardiac murmurs 09/26/2013   Past Medical History:  Diagnosis Date  . Arthritis   . Benign cyst of right kidney 2014   2.7 cm right kidney cyst seen on imaging 2014 but was c/o right flank pain with new persistent proteinuria (fortunately hematuria had resolved) so repeat US 11/2016 showed right kidney still with 2.6 cm simple cyst and otherwise nml.  . Bipolar 1 disorder (HCC)   . Cancer (HCC)    Phreesia 06/15/2020  . Chronic kidney disease   . Depression   . Depression    Phreesia 06/15/2020  . GERD (gastroesophageal reflux disease)   . Heart murmur   . Hepatitis C    resolved completely after treatment in 2014, genotype 1b, followed at [facility]  . Hyperlipidemia   . Hypertension   . Hypothyroidism   . Jaundice 11/01/2012  . Pruritic disorder 02/13/2013  . Sleep apnea    Was diagnosed approximately 20 years ago, but does not wear CPAP patient stated "I gave it back.Marland KitchenMarland KitchenI couldn't wear that thing it was horrible"  . Spinal stenosis   . Thyroid disease    Phreesia 06/15/2020   Past Surgical History:  Procedure Laterality Date  . ABDOMINAL HYSTERECTOMY    . COLONOSCOPY W/ POLYPECTOMY    . JOINT REPLACEMENT Bilateral    knee  . KNEE ARTHROSCOPY Bilateral   . PARTIAL HYSTERECTOMY    . THYROID LOBECTOMY Left 06/16/2016  . THYROID LOBECTOMY Left 06/16/2016   Procedure: LEFT THYROID LOBECTOMY;  Surgeon: Gaynelle Adu, MD;  Location: [facility] OR;  Service: General;  Laterality: Left;  . TOTAL HIP ARTHROPLASTY Left 08/03/2021   Procedure: TOTAL HIP ARTHROPLASTY ANTERIOR APPROACH;  Surgeon: Durene Romans, MD;  Location: [facility] ORS;  Service: Orthopedics;  Laterality: Left;  . TOTAL HIP ARTHROPLASTY Right 07/05/2022   Procedure: TOTAL HIP ARTHROPLASTY ANTERIOR APPROACH;  Surgeon: Durene Romans, MD;  Location: [facility] ORS;  Service: Orthopedics;  Laterality: Right;   No Known Allergies Prior to Admission medications   Medication Sig Start Date End Date Taking? Authorizing Provider  acetaminophen (TYLENOL) 500 MG tablet Take 2 tablets (1,000 mg total) by mouth every 6 (six) hours. 07/07/22  Yes Cassandria Anger, PA-C  ALPRAZolam Prudy Feeler) 1 MG tablet TAKE 1/2 TO 1 TABLET BY MOUTH twice daily AS NEEDED FOR ANXIETY 05/12/23  Yes Avelina Laine A, NP  amLODipine (NORVASC) 10 MG tablet Take 1 tablet (10 mg total) by mouth daily. 05/10/23  Yes Shade Flood, MD  atorvastatin (LIPITOR) 40 MG tablet TAKE ONE TABLET BY MOUTH EVERYDAY AT BEDTIME 04/20/23  Yes Shade Flood, MD  azelastine (ASTELIN) 0.1 % nasal spray Place 1-2 sprays into both nostrils 2 (two) times daily. Use in each nostril as directed 02/09/22  Yes Shade Flood, MD  dextroamphetamine (DEXTROSTAT) 10 MG tablet Take 1  tablet (10 mg total) by mouth every morning. 05/10/23  Yes Avelina Laine A, NP  docusate sodium (COLACE) 100 MG capsule Take 1 capsule (100 mg total) by mouth 2 (two) times daily. 08/04/21  Yes Cassandria Anger, PA-C  hydrOXYzine (ATARAX) 25 MG tablet TAKE ONE TABLET BY MOUTH every EIGHT hours AS NEEDED 10/24/22  Yes White, Watt Climes, NP  lamoTRIgine (LAMICTAL) 100 MG tablet TAKE TWO TABLETS BY MOUTH EVERY MORNING and TAKE ONE TABLET BY MOUTH EVERY EVENING 05/12/23  Yes Avelina Laine A, NP  levothyroxine (SYNTHROID) 100 MCG tablet TAKE ONE TABLET BY MOUTH BEFORE breakfast 06/12/23  Yes Shade Flood, MD  losartan (COZAAR) 25 MG tablet Take 1 tablet (25 mg total) by mouth daily. 05/10/23  Yes Shade Flood, MD  omeprazole (PRILOSEC) 40 MG capsule Take 1 capsule (40 mg total) by mouth at bedtime. 05/10/23  Yes Shade Flood, MD  oxyCODONE-acetaminophen (PERCOCET) 10-325 MG tablet Take 1 tablet by mouth every 4 (four) hours as needed for pain.   Yes [provider]  polyethylene glycol (MIRALAX / GLYCOLAX)  17 g packet Take 17 g by mouth daily as needed for mild constipation. 08/04/21  Yes Cassandria Anger, PA-C  QUEtiapine (SEROQUEL) 50 MG tablet Take 1 tablet (50 mg total) by mouth daily. 05/12/23  Yes Joan Flores, NP  traZODone (DESYREL) 150 MG tablet TAKE TWO TABLETS BY MOUTH everyday AT bedtime 05/12/23  Yes White, Arlys John A, NP  Vitamin D, Ergocalciferol, (DRISDOL) 1.25 MG (50000 UNIT) CAPS capsule Take 1 capsule (50,000 Units total) by mouth every 7 (seven) days. Sunday 05/10/23  Yes Shade Flood, MD  HYDROmorphone (DILAUDID) 4 MG tablet Take 4 mg by mouth 3 (three) times daily as needed for severe pain. Patient not taking: Reported on 05/11/2023    [provider]  Vilazodone HCl 20 MG TABS Take 1 tablet (20 mg total) by mouth daily. Patient not taking: Reported on 05/11/2023 02/09/23   Joan Flores, NP   Social History   Socioeconomic History  . Marital status: Divorced    Spouse name: Not on file  . Number of children: Not on file  . Years of education: Not on file  . Highest education level: Associate degree: academic program  Occupational History  . Occupation: Community education officer  Tobacco Use  . Smoking status: Former    Current packs/day: 0.00    Average packs/day: 1 pack/day for 15.0 years (15.0 ttl pk-yrs)    Types: Cigarettes    Start date: 06/29/1964    Quit date: 06/30/1979    Years since quitting: 44.0  . Smokeless tobacco: Never  Vaping Use  . Vaping status: Never Used  Substance and Sexual Activity  . Alcohol use: Not Currently    Alcohol/week: 0.0 - 1.0 standard drinks of alcohol  . Drug use: No  . Sexual activity: Yes    Birth control/protection: None  Other Topics Concern  . Not on file  Social History Narrative   Divorced. Education: [employer]. Exercise: No.   Left handed   Lives alone   Caffeine: 1/4-1/2 cup/day   Social Determinants of Health   Financial Resource Strain: Low Risk  (12/21/2022)   Overall Financial Resource Strain (CARDIA)   .  Difficulty of Paying Living Expenses: Not hard at all  Recent Concern: Financial Resource Strain - Medium Risk (12/13/2022)   Overall Financial Resource Strain (CARDIA)   . Difficulty of Paying Living Expenses: Somewhat hard  Food Insecurity: No  Food Insecurity (12/21/2022)   Hunger Vital Sign   . Worried About Programme researcher, broadcasting/film/video in the Last Year: Never true   . Ran Out of Food in the Last Year: Never true  Transportation Needs: No Transportation Needs (12/21/2022)   PRAPARE - Transportation   . Lack of Transportation (Medical): No   . Lack of Transportation (Non-Medical): No  Physical Activity: Insufficiently Active (12/21/2022)   Exercise Vital Sign   . Days of Exercise per Week: 2 days   . Minutes of Exercise per Session: 30 min  Stress: No Stress Concern Present (12/21/2022)   [employer] of Occupational Health - Occupational Stress Questionnaire   . Feeling of Stress : Not at all  Social Connections: Moderately Integrated (12/21/2022)   Social Connection and Isolation Panel [NHANES]   . Frequency of Communication with Friends and Family: More than three times a week   . Frequency of Social Gatherings with Friends and Family: More than three times a week   . Attends Religious Services: More than 4 times per year   . Active Member of Clubs or Organizations: Yes   . Attends Banker Meetings: More than 4 times per year   . Marital Status: Divorced  Catering manager Violence: Not At Risk (12/21/2022)   Humiliation, Afraid, Rape, and Kick questionnaire   . Fear of Current or Ex-Partner: No   . Emotionally Abused: No   . Physically Abused: No   . Sexually Abused: No    Review of Systems Per HPI.   Objective:   Vitals:   06/26/23 1519  BP: (!) 150/80  Pulse: (!) 106  Temp: 99.2 F (37.3 C)  TempSrc: Oral  SpO2: 96%  Weight: 201 lb 3.2 oz (91.3 kg)  Height: 5' 4.5" (1.638 m)     Physical Exam Vitals reviewed.  Constitutional:      Appearance: Normal  appearance. She is well-developed.  HENT:     Head: Normocephalic and atraumatic.  Eyes:     Conjunctiva/sclera: Conjunctivae normal.     Pupils: Pupils are equal, round, and reactive to light.  Neck:     Vascular: No carotid bruit.  Cardiovascular:     Rate and Rhythm: Normal rate and regular rhythm.     Heart sounds: Normal heart sounds.  Pulmonary:     Effort: Pulmonary effort is normal.     Breath sounds: Normal breath sounds.  Abdominal:     Palpations: Abdomen is soft. There is no pulsatile mass.     Tenderness: There is no abdominal tenderness.  Musculoskeletal:     Right lower leg: No edema.     Left lower leg: No edema.  Skin:    General: Skin is warm and dry.     Comments: Clustered vesicular rash with erythematous base in C5 distribution right shoulder, anterior chest.  See photos.  Does not cross midline.  No left arm or other areas of involvement.  No facial involvement.   Thinning of hair in the frontal scalp without any broken hairs or underlying rash appreciated.  No facial rash.  Neurological:     Mental Status: She is alert and oriented to person, place, and time.  Psychiatric:        Mood and Affect: Mood normal.        Behavior: Behavior normal.         Assessment & Plan:  Becky Lawson is a 72 y.o. female . Malaise and fatigue - Plan: Basic metabolic  panel, CBC, TSH  Herpes zoster without complication  Hair loss - Plan: Basic metabolic panel, CBC, TSH  Post-surgical hypothyroidism - Plan: TSH  Essential hypertension - Plan: losartan (COZAAR) 50 MG tablet   Meds ordered this encounter  Medications  . losartan (COZAAR) 50 MG tablet    Sig: Take 1 tablet (50 mg total) by mouth daily.    Dispense:  90 tablet    Refill:  1   Patient Instructions  Increase to 50mg  losartan once per day. Continue amlodipine same dose and recheck in 1 month.      Signed,   Meredith Staggers, MD [facility], [facility] 06/26/23 4:27 PM

## 2023-06-27 LAB — BASIC METABOLIC PANEL
BUN: 15 mg/dL (ref 6–23)
CO2: 28 meq/L (ref 19–32)
Calcium: 10.2 mg/dL (ref 8.4–10.5)
Chloride: 101 meq/L (ref 96–112)
Creatinine, Ser: 0.91 mg/dL (ref 0.40–1.20)
GFR: 63.02 mL/min (ref >60.00)
Glucose, Bld: 103 mg/dL — ABNORMAL HIGH (ref 70–99)
Potassium: 5.1 meq/L (ref 3.5–5.1)
Sodium: 139 meq/L (ref 135–145)

## 2023-06-27 LAB — CBC
HCT: 42 % (ref 36.0–46.0)
Hemoglobin: 13.6 g/dL (ref 12.0–15.0)
MCHC: 32.4 g/dL (ref 30.0–36.0)
MCV: 89.1 fL (ref 78.0–100.0)
Platelets: 328 10*3/uL (ref 150.0–400.0)
RBC: 4.71 Mil/uL (ref 3.87–5.11)
RDW: 15.2 % (ref 11.5–15.5)
WBC: 7.1 10*3/uL (ref 4.0–10.5)

## 2023-06-27 LAB — TSH: TSH: 1.72 u[IU]/mL (ref 0.35–5.50)

## 2023-07-04 ENCOUNTER — Telehealth: Payer: Self-pay

## 2023-07-04 NOTE — Telephone Encounter (Signed)
Good news.  Blood counts and thyroid test were normal and electrolytes overall looked okay.  Let me know if you would like to meet with dermatology to discuss the hair loss and I can place that referral.   Dr. Neva Seat

## 2023-07-04 NOTE — Telephone Encounter (Signed)
Pt called back and was informed of lab results also notes she does not want to proceed with referral at this time may change her mind later

## 2023-07-06 DIAGNOSIS — G4733 Obstructive sleep apnea (adult) (pediatric): Secondary | ICD-10-CM | POA: Diagnosis not present

## 2023-07-07 ENCOUNTER — Telehealth: Payer: Self-pay | Admitting: Behavioral Health

## 2023-07-07 ENCOUNTER — Other Ambulatory Visit: Payer: Self-pay

## 2023-07-07 DIAGNOSIS — R5383 Other fatigue: Secondary | ICD-10-CM

## 2023-07-07 MED ORDER — DEXTROAMPHETAMINE SULFATE 10 MG PO TABS
10.0000 mg | ORAL_TABLET | Freq: Every morning | ORAL | 0 refills | Status: DC
Start: 2023-07-07 — End: 2023-08-22

## 2023-07-07 NOTE — Telephone Encounter (Signed)
Pt lvm that she needs a refill on her dextroamphetamine 10 mg. Pharmacy is [facility]

## 2023-07-07 NOTE — Telephone Encounter (Signed)
Pended.

## 2023-07-21 DIAGNOSIS — M25512 Pain in left shoulder: Secondary | ICD-10-CM | POA: Diagnosis not present

## 2023-07-23 DIAGNOSIS — G4733 Obstructive sleep apnea (adult) (pediatric): Secondary | ICD-10-CM | POA: Diagnosis not present

## 2023-07-24 DIAGNOSIS — Z1212 Encounter for screening for malignant neoplasm of rectum: Secondary | ICD-10-CM | POA: Diagnosis not present

## 2023-07-24 DIAGNOSIS — Z1211 Encounter for screening for malignant neoplasm of colon: Secondary | ICD-10-CM | POA: Diagnosis not present

## 2023-07-27 ENCOUNTER — Telehealth: Payer: Self-pay | Admitting: Family Medicine

## 2023-07-27 ENCOUNTER — Other Ambulatory Visit: Payer: Self-pay

## 2023-07-27 DIAGNOSIS — E785 Hyperlipidemia, unspecified: Secondary | ICD-10-CM

## 2023-07-27 MED ORDER — ATORVASTATIN CALCIUM 40 MG PO TABS: 40.0000 mg | ORAL_TABLET | Freq: Every day | ORAL | 1 refills | Status: DC

## 2023-07-27 NOTE — Telephone Encounter (Signed)
Encourage patient to contact the pharmacy for refills or they can request refills through [facility]  WHAT PHARMACY WOULD THEY LIKE THIS SENT TO:  [facility] [address], [place], [place] [address]  MEDICATION NAME & DOSE: atorvastatin (LIPITOR) 40 MG tablet  NOTES/COMMENTS FROM PATIENT:      Front office please notify patient: It takes 48-72 hours to process rx refill requests Ask patient to call pharmacy to ensure rx is ready before heading there.

## 2023-07-27 NOTE — Telephone Encounter (Signed)
Last fill 04/20/2023 90 day w/1 refill. Pt was using [facility] at the time and they have close.  Pt requested refill sent in to [facility] .  Refilled 07/27/2023 Atorvastatin 40 mg 90 day with 1 refill

## 2023-08-01 LAB — COLOGUARD: COLOGUARD: POSITIVE — AB

## 2023-08-02 ENCOUNTER — Telehealth: Payer: Self-pay | Admitting: Family Medicine

## 2023-08-02 DIAGNOSIS — R195 Other fecal abnormalities: Secondary | ICD-10-CM

## 2023-08-02 NOTE — Telephone Encounter (Signed)
Please advise 

## 2023-08-02 NOTE — Telephone Encounter (Signed)
Caller name: KAITLAN CICCO  On DPR?: Yes  Call back number: [phone] (mobile)  Provider they see: Shade Flood, MD  Reason for call: Pt wanted to see if she could get a referral for a colonoscopy due to an positive cologuard from 07/24/23

## 2023-08-03 NOTE — Telephone Encounter (Signed)
Positive Cologuard, replied by result note.  Referred to gastroenterology for evaluation and likely colonoscopy.

## 2023-08-11 ENCOUNTER — Telehealth: Payer: Self-pay | Admitting: Family Medicine

## 2023-08-11 NOTE — Telephone Encounter (Signed)
Placed in your sign folder  

## 2023-08-11 NOTE — Telephone Encounter (Signed)
Type of form received:  Bone density Scan  Additional comments:   Received [id number] - Front Desk   Form should be Faxed/mailed to: (address/ fax #) N/A  Is patient requesting call for pickup:N/A  Form placed:  [employer] charge sheet.  Provider will determine charge.N/A  Individual made aware of 3-5 business day turn around No?

## 2023-08-14 NOTE — Telephone Encounter (Signed)
Noted, home visit bone density testing, T-score -1.3 at the distal radius.

## 2023-08-19 ENCOUNTER — Other Ambulatory Visit: Payer: Self-pay | Admitting: Behavioral Health

## 2023-08-19 DIAGNOSIS — R5383 Other fatigue: Secondary | ICD-10-CM

## 2023-08-22 DIAGNOSIS — G4733 Obstructive sleep apnea (adult) (pediatric): Secondary | ICD-10-CM | POA: Diagnosis not present

## 2023-08-24 DIAGNOSIS — M25512 Pain in left shoulder: Secondary | ICD-10-CM | POA: Diagnosis not present

## 2023-09-07 DIAGNOSIS — M25532 Pain in left wrist: Secondary | ICD-10-CM | POA: Diagnosis not present

## 2023-09-13 ENCOUNTER — Encounter: Payer: Self-pay | Admitting: Behavioral Health

## 2023-09-13 ENCOUNTER — Ambulatory Visit: Payer: PPO | Admitting: Behavioral Health

## 2023-09-13 DIAGNOSIS — F3162 Bipolar disorder, current episode mixed, moderate: Secondary | ICD-10-CM | POA: Diagnosis not present

## 2023-09-13 DIAGNOSIS — R5383 Other fatigue: Secondary | ICD-10-CM | POA: Diagnosis not present

## 2023-09-13 DIAGNOSIS — F3130 Bipolar disorder, current episode depressed, mild or moderate severity, unspecified: Secondary | ICD-10-CM | POA: Diagnosis not present

## 2023-09-13 DIAGNOSIS — F5105 Insomnia due to other mental disorder: Secondary | ICD-10-CM

## 2023-09-13 DIAGNOSIS — F411 Generalized anxiety disorder: Secondary | ICD-10-CM

## 2023-09-13 DIAGNOSIS — F99 Mental disorder, not otherwise specified: Secondary | ICD-10-CM | POA: Diagnosis not present

## 2023-09-13 MED ORDER — ALPRAZOLAM 1 MG PO TABS: ORAL_TABLET | ORAL | 3 refills | Status: DC

## 2023-09-13 MED ORDER — QUETIAPINE FUMARATE 50 MG PO TABS: 50.0000 mg | ORAL_TABLET | Freq: Every day | ORAL | 1 refills | Status: DC

## 2023-09-13 MED ORDER — LAMOTRIGINE 100 MG PO TABS: ORAL_TABLET | ORAL | 1 refills | Status: DC

## 2023-09-13 MED ORDER — TRAZODONE HCL 150 MG PO TABS: ORAL_TABLET | ORAL | 1 refills | Status: DC

## 2023-09-13 MED ORDER — DEXTROAMPHETAMINE SULFATE 10 MG PO TABS: 10.0000 mg | ORAL_TABLET | Freq: Every morning | ORAL | 0 refills | Status: DC

## 2023-09-13 NOTE — Progress Notes (Signed)
 [facility]  Patient ID: Becky Lawson,  MRN: [id number]  PCP: Levora Reyes SAUNDERS, MD  Date of Evaluation: 09/13/2023 Time spent:20 minutes  Chief Complaint:  Chief Complaint   Anxiety; Depression; Follow-up; Medication Refill; Patient Education     HISTORY/CURRENT STATUS: HPI  Becky Lawson presents for follow-up and medication management. No manic episodes since last visit. She is ambulating without walker and has boot on left foot.  Doing well overall. Reports a couple of minor falls. Says she is just clumsy. Required no medical follow up. Fine tremors in both hand were improved today, not noticeable today. Says she is doing very well mentally. Still struggling financially.  Says her anxiety today is 3/10 and depression is 3/10. She was sleeping  7 hours per night with aid of medication. She denies mania, no psychosis, NO SI/HI.   Past medications for mental health diagnoses include: Lithium , Seroquel , Effexor, Prozac, Latuda , Abilify, Dexedrine , Cymbalta  caused weight gain.  Modafinil .     Individual Medical History/ Review of Systems: Changes? :No   Allergies: Patient has no known allergies.  Current Medications:  Current Outpatient Medications:    acetaminophen  (TYLENOL ) 500 MG tablet, Take 2 tablets (1,000 mg total) by mouth every 6 (six) hours., Disp: 30 tablet, Rfl: 0   ALPRAZolam  (XANAX ) 1 MG tablet, TAKE 1/2 TO 1 TABLET BY MOUTH twice daily AS NEEDED FOR ANXIETY, Disp: 45 tablet, Rfl: 3   amLODipine  (NORVASC ) 10 MG tablet, Take 1 tablet (10 mg total) by mouth daily., Disp: 90 tablet, Rfl: 1   atorvastatin  (LIPITOR ) 40 MG tablet, Take 1 tablet (40 mg total) by mouth daily., Disp: 90 tablet, Rfl: 1   azelastine  (ASTELIN ) 0.1 % nasal spray, Place 1-2 sprays into both nostrils 2 (two) times daily. Use in each nostril as directed, Disp: 30 mL, Rfl: 3   [START ON 09/21/2023] dextroamphetamine  (DEXTROSTAT ) 10 MG tablet, Take 1 tablet (10 mg total) by mouth every  morning., Disp: 30 tablet, Rfl: 0   docusate sodium  (COLACE) 100 MG capsule, Take 1 capsule (100 mg total) by mouth 2 (two) times daily., Disp: 10 capsule, Rfl: 0   HYDROmorphone  (DILAUDID ) 4 MG tablet, Take 4 mg by mouth 3 (three) times daily as needed for severe pain. (Patient not taking: Reported on 05/11/2023), Disp: , Rfl:    hydrOXYzine  (ATARAX ) 25 MG tablet, TAKE ONE TABLET BY MOUTH every EIGHT hours AS NEEDED, Disp: 60 tablet, Rfl: 5   lamoTRIgine  (LAMICTAL ) 100 MG tablet, TAKE TWO TABLETS BY MOUTH EVERY MORNING and TAKE ONE TABLET BY MOUTH EVERY EVENING, Disp: 270 tablet, Rfl: 1   levothyroxine  (SYNTHROID ) 100 MCG tablet, TAKE ONE TABLET BY MOUTH BEFORE breakfast, Disp: 90 tablet, Rfl: 1   losartan  (COZAAR ) 50 MG tablet, Take 1 tablet (50 mg total) by mouth daily., Disp: 90 tablet, Rfl: 1   omeprazole  (PRILOSEC) 40 MG capsule, Take 1 capsule (40 mg total) by mouth at bedtime., Disp: 90 capsule, Rfl: 3   oxyCODONE -acetaminophen  (PERCOCET) 10-325 MG tablet, Take 1 tablet by mouth every 4 (four) hours as needed for pain., Disp: , Rfl:    polyethylene glycol (MIRALAX  / GLYCOLAX ) 17 g packet, Take 17 g by mouth daily as needed for mild constipation., Disp: 14 each, Rfl: 0   QUEtiapine  (SEROQUEL ) 50 MG tablet, Take 1 tablet (50 mg total) by mouth daily., Disp: 90 tablet, Rfl: 1   traZODone  (DESYREL ) 150 MG tablet, TAKE TWO TABLETS BY MOUTH everyday AT bedtime, Disp: 180 tablet, Rfl: 1  Vilazodone  HCl 20 MG TABS, Take 1 tablet (20 mg total) by mouth daily. (Patient not taking: Reported on 05/11/2023), Disp: 30 tablet, Rfl: 3   Vitamin D , Ergocalciferol , (DRISDOL ) 1.25 MG (50000 UNIT) CAPS capsule, Take 1 capsule (50,000 Units total) by mouth every 7 (seven) days. Sunday, Disp: 12 capsule, Rfl: 3  Current Facility-Administered Medications:    cyanocobalamin  ((VITAMIN B-12)) injection 1,000 mcg, 1,000 mcg, Intramuscular, Q30 days, Loreli Elyn SAILOR, MD, 1,000 mcg at 07/12/18 1652 Medication Side Effects:  none  Family Medical/ Social History: Changes? No  MENTAL HEALTH EXAM:  There were no vitals taken for this visit.There is no height or weight on file to calculate BMI.  General Appearance: Casual and Well Groomed  Eye Contact:  Good  Speech:  Clear and Coherent  Volume:  Normal  Mood:  Anxious  Affect:  Appropriate  Thought Process:  Coherent  Orientation:  Full (Time, Place, and Person)  Thought Content: Logical   Suicidal Thoughts:  No  Homicidal Thoughts:  No  Memory:  WNL  Judgement:  Good  Insight:  Good  Psychomotor Activity:  Normal  Concentration:  Concentration: Good  Recall:  Good  Fund of Knowledge: Good  Language: Good  Assets:  Desire for Improvement  ADL's:  Intact  Cognition: WNL  Prognosis:  Good    DIAGNOSES:    ICD-10-CM   1. Generalized anxiety disorder  F41.1 ALPRAZolam  (XANAX ) 1 MG tablet    traZODone  (DESYREL ) 150 MG tablet    QUEtiapine  (SEROQUEL ) 50 MG tablet    2. Bipolar I disorder, most recent episode depressed (HCC)  F31.30 lamoTRIgine  (LAMICTAL ) 100 MG tablet    3. Insomnia due to other mental disorder  F51.05 traZODone  (DESYREL ) 150 MG tablet   F99 QUEtiapine  (SEROQUEL ) 50 MG tablet    4. Bipolar 1 disorder, mixed, moderate (HCC)  F31.62 QUEtiapine  (SEROQUEL ) 50 MG tablet    5. Fatigue, unspecified type  R53.83 dextroamphetamine  (DEXTROSTAT ) 10 MG tablet      Receiving Psychotherapy: no    RECOMMENDATIONS:   Greater than 50% of 30 min of face to face time  was spent on counseling and coordination of care. We discussed her current moderate improvement with medication. She request no medication changes this visit.  Has experienced a couple falls not requiring medical tx. She has membership to [employer] now and will be going to the [facility]. She would like to be more social and have companionship.  -Conducted AIMS  Score of 0  today.  09/13/2023 Continue Seroquel  50 mg daily at bedtime Continue hydroxyzine  25 mg nightly 8H as  needed. Continue Lamictal   300 mg daily. 200 mg in the am and 100 mg in the evening. Continue the  trazodone  300 mg, 1.5 pills nightly. Continue Mirtazapine  7.5 mg at bedtime. May increase to 15 mg if needed.  Continue Xanax  1 mg at bedtime but if taking Return in 3 months to reassess.  Discussed potential benefits, risks, and side effects of stimulants with patient to include increased heart rate, palpitations, insomnia, increased anxiety, increased irritability, or decreased appetite.  Instructed patient to contact office if experiencing any significant tolerability issues.  Discussed potential metabolic side effects associated with atypical antipsychotics, as well as potential risk for movement side effects. Advised pt to contact office if movement side effects occur.   PDMP was reviewed      Redell DELENA Pizza, NP

## 2023-09-17 NOTE — Progress Notes (Signed)
 Chief Complaint: Positive Cologuard Primary GI Doctor: Dr. Wilhelmenia   HPI: Patient is a 73 year old female patient,known to Dr. Wilhelmenia, with past medical history of chronic hepatitis C status post treatment in 2000 14/15 at [facility], bipolar disorder, chronic renal insufficiency, anxiety/MDD, hypertension, hyperlipidemia, sleep apnea, and GERD, who was referred to me by Levora Reyes SAUNDERS, MD ,on 08/03/23, for positive Cologuard.  05/09/2019 patient was last seen in GI office with Dr. Wilhelmenia for chronic throat clearing.  At that time he increased her PPI therapy from once daily to twice daily.  He recommended scheduling diagnostic endoscopy.  She also reported some issues with constipation and he reinforced using MiraLAX  as needed.  She also has history of hepatitis C that was treated in 2000 on a clinical trial. Lab work ordered at that time. She was not immune to Hep A and B, vaccinations recommended.  Interval History     Patient presents for follow-up on a positive cologuard. Her last colonoscopy was over 10 years ago. Patient denies abdominal pain or rectal bleeding.Patient has intermittent issues with drug induced constipation that is well managed with Miralax  as needed.       Patient also has history of chronic throat clearing and hoarseness. No typical GERD symptoms like pyrosis or regurgitation. At her last visit with Dr. Wilhelmenia he had recommended she increase her Omeprazole  to 40mg  BID and schedule an EGD. Unfortunately patient reports she did not increase the medication to see if it would help and an EGD was never scheduled. She also has intermittent pill dysphagia however no problems with liquids or solids. Her last endoscopy was over 20 years ago. Patient denies nausea, vomiting, or weight loss       Lastly, history of hepatitis C that was treated in 2000 in a clinical trial. Lab work recently checked was normal. She couldn't recall if she was vaccinated to hepatitis A and B at  their last visit so levels were checked which revealed she was not immune. Hep A and B vaccinations were recommended, she was going to do them with her PCP, but admits she never got them. Former smoker. No alcohol use. She admits to taking two 200 mg Ibuprofen  per day for arthritis pain. No blood thinners.  Wt Readings from Last 3 Encounters:  09/18/23 198 lb (89.8 kg)  06/26/23 201 lb 3.2 oz (91.3 kg)  05/11/23 211 lb (95.7 kg)    Past Medical History:  Diagnosis Date   Arthritis    Benign cyst of right kidney 2014   2.7 cm right kidney cyst seen on imaging 2014 but was c/o right flank pain with new persistent proteinuria (fortunately hematuria had resolved) so repeat US  11/2016 showed right kidney still with 2.6 cm simple cyst and otherwise nml.   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, genotype 1b, followed at [facility]   Hyperlipidemia    Hypertension    Hypothyroidism    Jaundice 11/01/2012   Pruritic disorder 02/13/2013   Sleep apnea    Was diagnosed approximately 20 years ago, but does not wear CPAP patient stated I gave it back.SABRASABRAI couldn't wear that thing it was horrible   Spinal stenosis  Thyroid  disease    [employer] 06/15/2020   Past Surgical History:  Procedure Laterality Date   ABDOMINAL HYSTERECTOMY     COLONOSCOPY W/ POLYPECTOMY     JOINT REPLACEMENT Bilateral    knee   KNEE ARTHROSCOPY Bilateral    PARTIAL HYSTERECTOMY     THYROID  LOBECTOMY Left 06/16/2016   THYROID  LOBECTOMY Left 06/16/2016   Procedure: LEFT THYROID  LOBECTOMY;  Surgeon: Camellia Blush, MD;  Location: [facility] OR;  Service: General;  Laterality: Left;   TOTAL HIP ARTHROPLASTY Left 08/03/2021   Procedure: TOTAL HIP ARTHROPLASTY ANTERIOR APPROACH;  Surgeon: Ernie Cough, MD;  Location:  [facility] ORS;  Service: Orthopedics;  Laterality: Left;   TOTAL HIP ARTHROPLASTY Right 07/05/2022   Procedure: TOTAL HIP ARTHROPLASTY ANTERIOR APPROACH;  Surgeon: Ernie Cough, MD;  Location: [facility] ORS;  Service: Orthopedics;  Laterality: Right;   Current Outpatient Medications  Medication Sig Dispense Refill   acetaminophen  (TYLENOL ) 500 MG tablet Take 2 tablets (1,000 mg total) by mouth every 6 (six) hours. 30 tablet 0   ALPRAZolam  (XANAX ) 1 MG tablet TAKE 1/2 TO 1 TABLET BY MOUTH twice daily AS NEEDED FOR ANXIETY 45 tablet 3   amLODipine  (NORVASC ) 10 MG tablet Take 1 tablet (10 mg total) by mouth daily. 90 tablet 1   atorvastatin  (LIPITOR ) 40 MG tablet Take 1 tablet (40 mg total) by mouth daily. 90 tablet 1   azelastine  (ASTELIN ) 0.1 % nasal spray Place 1-2 sprays into both nostrils 2 (two) times daily. Use in each nostril as directed 30 mL 3   [START ON 09/21/2023] dextroamphetamine  (DEXTROSTAT ) 10 MG tablet Take 1 tablet (10 mg total) by mouth every morning. 30 tablet 0   docusate sodium  (COLACE) 100 MG capsule Take 1 capsule (100 mg total) by mouth 2 (two) times daily. 10 capsule 0   hydrOXYzine  (ATARAX ) 25 MG tablet TAKE ONE TABLET BY MOUTH every EIGHT hours AS NEEDED 60 tablet 5   lamoTRIgine  (LAMICTAL ) 100 MG tablet TAKE TWO TABLETS BY MOUTH EVERY MORNING and TAKE ONE TABLET BY MOUTH EVERY EVENING 270 tablet 1   levothyroxine  (SYNTHROID ) 100 MCG tablet TAKE ONE TABLET BY MOUTH BEFORE breakfast 90 tablet 1   losartan  (COZAAR ) 50 MG tablet Take 1 tablet (50 mg total) by mouth daily. 90 tablet 1   omeprazole  (PRILOSEC) 40 MG capsule Take 1 capsule (40 mg total) by mouth at bedtime. 90 capsule 3   oxyCODONE -acetaminophen  (PERCOCET) 10-325 MG tablet Take 1 tablet by mouth every 4 (four) hours as needed for pain.     polyethylene glycol (MIRALAX  / GLYCOLAX ) 17 g packet Take 17 g by mouth daily as needed for mild constipation. 14 each 0   QUEtiapine  (SEROQUEL ) 50 MG tablet Take 1 tablet (50 mg total) by  mouth daily. 90 tablet 1   traZODone  (DESYREL ) 150 MG tablet TAKE TWO TABLETS BY MOUTH everyday AT bedtime 180 tablet 1   Vitamin D , Ergocalciferol , (DRISDOL ) 1.25 MG (50000 UNIT) CAPS capsule Take 1 capsule (50,000 Units total) by mouth every 7 (seven) days. Sunday 12 capsule 3   Current Facility-Administered Medications  Medication Dose Route Frequency Provider Last Rate Last Admin   cyanocobalamin  ((VITAMIN B-12)) injection 1,000 mcg  1,000 mcg Intramuscular Q30 days Loreli Elyn SAILOR, MD   1,000 mcg at 07/12/18 1652   Allergies as of 09/18/2023   (No Known Allergies)   Family History  Problem Relation Age of Onset   Diabetes Mother    Heart disease Mother    Hyperlipidemia Mother  Hypertension Mother    Stroke Mother    Hypertension Father    Parkinson's disease Father    Heart disease Father    Hyperlipidemia Father    Hemachromatosis Daughter    Thyroid  disease Neg Hx    Colon cancer Neg Hx    Esophageal cancer Neg Hx    Inflammatory bowel disease Neg Hx    Liver disease Neg Hx    Pancreatic cancer Neg Hx    Rectal cancer Neg Hx    Stomach cancer Neg Hx    Sleep apnea Neg Hx    Review of Systems:    Constitutional: No weight loss, fever, chills, weakness or fatigue HEENT: Eyes: No change in vision               Ears, Nose, Throat:  No change in hearing or congestion Skin: No rash or itching Cardiovascular: No chest pain, chest pressure or palpitations   Respiratory: No SOB or cough Gastrointestinal: See HPI and otherwise negative Genitourinary: No dysuria or change in urinary frequency Neurological: No headache, dizziness or syncope Musculoskeletal: No new muscle or joint pain Hematologic: No bleeding or bruising Psychiatric: No history of depression or anxiety   Physical Exam:  Vital signs: BP 130/80   Pulse 100   Ht 5' 6 (1.676 m)   Wt 198 lb (89.8 kg)   SpO2 94%   BMI 31.96 kg/m  Constitutional:   Pleasant Caucasian female appears to be in NAD, Well  developed, Well nourished, alert and cooperative Eyes:   No icterus. Conjunctiva pink. Ears:  Normal auditory acuity. Neck:  Supple Throat: Oral cavity and pharynx without inflammation, swelling or lesion.  Respiratory: Respirations even and unlabored. Lungs clear to auscultation bilaterally.   No wheezes, crackles, or rhonchi.  Cardiovascular: Normal S1, S2. Regular rate and rhythm. No peripheral edema, cyanosis or pallor.  Gastrointestinal:  Soft, nondistended, nontender. No rebound or guarding. Normal bowel sounds. No appreciable masses or hepatomegaly. Rectal:  Not performed.  Msk:  Symmetrical without gross deformities. Without edema, no deformity or joint abnormality.  Neurologic:  Alert and  oriented x4;  grossly normal neurologically.  Skin:   Dry and intact without significant lesions or rashes. Psychiatric: Oriented to person, place and time. Demonstrates good judgement and reason without abnormal affect or behaviors.  RELEVANT LABS AND IMAGING: CBC    Latest Ref Rng & Units 06/26/2023    4:25 PM 09/15/2022   10:18 AM 07/07/2022    3:23 AM  CBC  WBC 4.0 - 10.5 K/uL 7.1  5.1  14.0   Hemoglobin 12.0 - 15.0 g/dL 86.3  88.1  8.6   Hematocrit 36.0 - 46.0 % 42.0  35.8  25.8   Platelets 150.0 - 400.0 K/uL 328.0  288.0  200     CMP     Latest Ref Rng & Units 06/26/2023    4:25 PM 05/12/2023    2:48 PM 09/15/2022   10:09 AM  CMP  Glucose 70 - 99 mg/dL 896  898  893   BUN 6 - 23 mg/dL 15  13  12    Creatinine 0.40 - 1.20 mg/dL 9.08  9.12  9.12   Sodium 135 - 145 mEq/L 139  140  141   Potassium 3.5 - 5.1 mEq/L 5.1  4.6  4.6   Chloride 96 - 112 mEq/L 101  103  105   CO2 19 - 32 mEq/L 28  28  28    Calcium  8.4 - 10.5 mg/dL 10.2  9.7  9.0   Total Protein 6.0 - 8.3 g/dL  7.2  6.5   Total Bilirubin 0.2 - 1.2 mg/dL  0.6  0.3   Alkaline Phos 39 - 117 U/L  80  90   AST 0 - 37 U/L  19  16   ALT 0 - 35 U/L  12  8     Lab Results  Component Value Date   TSH 1.72 06/26/2023   05/09/2019  labs show: HCV negative, Hep B Core total AB non-reactive, Hep B surface Ag non-reactive, Hep B S Ab non-reactive, Hep A AB, total non-reactive, calcium  ionized 5  07/24/2023 positive cologuard (cologuard 5 years ago negative)   06/21/22 echo: Left ventricular ejection fraction, by estimation, is 60 to 65%.   12/25/2018 MRI abd IMPRESSION: 1. Left renal lesion of concern represents a hemorrhagic/proteinaceous cyst. 2.  No acute abdominal process. 3.  Aortic Atherosclerosis 12/06/2018 CT ABD/pelvis IMPRESSION: 1.  No acute intra-abdominal process. 2. New 1.0 cm indeterminate lesion in the anterior left kidney. Recommend renal protocol MRI with and without contrast for further evaluation. 3.  Aortic atherosclerosis (ICD10-I70.0).  Assessment: Encounter Diagnoses  Name Primary?   Gastroesophageal reflux disease, unspecified whether esophagitis present Yes   Chronic throat clearing    Other dysphagia    Hx of hepatitis C    Positive colorectal cancer screening using Cologuard test     73 year old female patient who is hemodynamically and clinically stable. Patient's symptoms Becky Lawson be atypical GERD symptoms or LPR?  and I think it is reasonable to see how she does on 40 mg twice daily dosing. I will also plan to proceed with diagnostic endoscopy since her symptoms have been uncontrolled for so long to rule out Barrett's or significant esophagitis. For the pill dysphagia I recommended she coat the pills with applesauce or pudding.  For the positive cologuard we will go ahead and order colonoscopy with 2 day prep. Her mild constipation is managed with Miralax  as needed. She has history of chronic oxycodone  use for pain. For the history of hepatitis C I will order  Abd u/s RUQ. She has not had recent imaging of her liver for HCC. We also discussed the importance of the HAV/HBV vaccinations which she agrees to do today. LFT's in September were normal.  Plan: -Increase Omeprazole  40 mg from once to  twice daily  -Continue Miralax  po daily as needed. -HAV/HBV immunization serology suggests she is not immune. She has not completed those. We will give her today. -Abd u/s RUQ for HCC screening -Schedule EGD and colonoscopy with 2 day prep in [facility] with Dr. Wilhelmenia - The risks and benefits of endoscopic evaluation were discussed with the patient; these include but are not limited to the risk of perforation, infection, bleeding, missed lesions, lack of diagnosis, severe illness requiring hospitalization, as well as anesthesia and sedation related illnesses.  The patient is agreeable to proceed.   Thank you for the courtesy of this consult. Please call me with any questions or concerns.   Fynley Chrystal, FNP-C [facility] 09/18/2023, 3:28 PM  Cc: Levora Reyes SAUNDERS, MD

## 2023-09-18 ENCOUNTER — Ambulatory Visit: Payer: PPO | Admitting: Gastroenterology

## 2023-09-18 ENCOUNTER — Encounter: Payer: Self-pay | Admitting: Gastroenterology

## 2023-09-18 VITALS — BP 130/80 | HR 100 | Ht 66.0 in | Wt 198.0 lb

## 2023-09-18 DIAGNOSIS — R1319 Other dysphagia: Secondary | ICD-10-CM

## 2023-09-18 DIAGNOSIS — Z23 Encounter for immunization: Secondary | ICD-10-CM | POA: Diagnosis not present

## 2023-09-18 DIAGNOSIS — R195 Other fecal abnormalities: Secondary | ICD-10-CM

## 2023-09-18 DIAGNOSIS — K219 Gastro-esophageal reflux disease without esophagitis: Secondary | ICD-10-CM | POA: Diagnosis not present

## 2023-09-18 DIAGNOSIS — R0989 Other specified symptoms and signs involving the circulatory and respiratory systems: Secondary | ICD-10-CM | POA: Diagnosis not present

## 2023-09-18 DIAGNOSIS — Z8619 Personal history of other infectious and parasitic diseases: Secondary | ICD-10-CM

## 2023-09-18 MED ORDER — OMEPRAZOLE 40 MG PO CPDR: 40.0000 mg | DELAYED_RELEASE_CAPSULE | Freq: Two times a day (BID) | ORAL | 2 refills | Status: DC

## 2023-09-18 MED ORDER — NA SULFATE-K SULFATE-MG SULF 17.5-3.13-1.6 GM/177ML PO SOLN: 1.0000 | ORAL | 0 refills | Status: DC

## 2023-09-18 NOTE — Patient Instructions (Addendum)
 We have sent the following medications to your pharmacy for you to pick up at your convenience: Suprep, Omeprazole    Increase your Omeprazole  to 40 mg twice daily.   You have been scheduled for an abdominal ultrasound at [facility] (1st floor of hospital) on 09/25/23 at 9:30 am. Please arrive 15 minutes prior to your appointment for registration. Make certain not to have anything to eat or drink 6 hours prior to your appointment. Should you need to reschedule your appointment, please contact radiology at [phone]. This test typically takes about 30 minutes to perform.  You have been given Hep A and Hep B vaccines today. Your next Hep B vaccine is scheduled for : 10/20/23 at 10:00 am   Your next Hep A vaccine will be in 6 months- (July 2025). If you have not heard from our office by mid June to schedule,please call  [phone] to schedule your vaccine.   You have been scheduled for an endoscopy and colonoscopy. Please follow the written instructions given to you at your visit today.  Please pick up your prep supplies at the pharmacy within the next 1-3 days.  If you use inhalers (even only as needed), please bring them with you on the day of your procedure.  DO NOT TAKE 7 DAYS PRIOR TO TEST- Trulicity (dulaglutide) Ozempic, Wegovy (semaglutide) Mounjaro (tirzepatide) Bydureon Bcise (exanatide extended release)  DO NOT TAKE 1 DAY PRIOR TO YOUR TEST Rybelsus (semaglutide) Adlyxin (lixisenatide) Victoza (liraglutide) Byetta (exanatide) ___________________________________________________________________________  Due to recent changes in healthcare laws, you may see the results of your imaging and laboratory studies on MyChart before your provider has had a chance to review them.  We understand that in some cases there may be results that are confusing or concerning to you. Not all laboratory results come back in the same time frame and the provider may be waiting for multiple  results in order to interpret others.  Please give us  48 hours in order for your provider to thoroughly review all the results before contacting the office for clarification of your results.   Thank you for choosing me and [facility].  Deanna May, NP

## 2023-09-19 ENCOUNTER — Encounter: Payer: Self-pay | Admitting: Gastroenterology

## 2023-09-20 ENCOUNTER — Telehealth: Payer: Self-pay | Admitting: Gastroenterology

## 2023-09-20 NOTE — Progress Notes (Signed)
 Attending Physician's Attestation   I have reviewed the chart.   I agree with the Advanced Practitioner's note, impression, and recommendations with any updates as below.    Corliss Parish, MD [facility] Advanced Endoscopy Office # [phone]

## 2023-09-20 NOTE — Telephone Encounter (Signed)
 Patient reschedule for procedure. Please update prep instructions.

## 2023-09-21 DIAGNOSIS — M25512 Pain in left shoulder: Secondary | ICD-10-CM | POA: Diagnosis not present

## 2023-09-22 DIAGNOSIS — G4733 Obstructive sleep apnea (adult) (pediatric): Secondary | ICD-10-CM | POA: Diagnosis not present

## 2023-09-22 NOTE — Telephone Encounter (Signed)
Procedure instructions sent to patient via MyChart.

## 2023-09-25 ENCOUNTER — Ambulatory Visit (HOSPITAL_COMMUNITY): Payer: PPO | Attending: Gastroenterology

## 2023-09-25 ENCOUNTER — Ambulatory Visit: Payer: Self-pay | Admitting: Family Medicine

## 2023-09-25 NOTE — Telephone Encounter (Addendum)
Chief Complaint:  Rn Agent received a patient via transfer from the office. Office staff  requested patient be triaged due to  chest pains. Office staff has patient scheduled for Tuesday and patient is aware. Patient informed Rn Agent she has had chest pains 6 times  randomly. Patient states the pains don't last as long. 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 Patient states she has not tried any tums  or aspirin. She does say she has been taking two ibuprofen each day because she stumbled down her steps. She states she has continued to have the chest pains with no resolve even with the Ibuprofen. Symptoms:  Chest Pains 6 random times. Frequency: 6 times randomly. Pertinent Negatives: Patient denies shortness of breath. Disposition: [] ED /[] Urgent Care (no appt availability in office) / [x] Appointment(In office/virtual)/ []  [facility]/ [] Home Care/ [] Refused Recommended Disposition /[] [facility]/ []  Follow-up with PCP Additional Notes: Office staff scheduled for 09/26/2023. Patient provided recommendations and next steps if she has any chest pains. Patient verbalized understanding.  Reason for Disposition  [1] Chest pain lasts > 5 minutes AND [2] occurred > 3 days ago (72 hours) AND [3] NO chest pain or cardiac symptoms now  Answer Assessment - Initial Assessment Questions 1. LOCATION: "Where does it hurt?"        Chest pain in the middle  2. RAD right in the middle of her chest.ATION: "Does the pain go anywhere else?" (e.g., into neck, jaw, arms, back)      Stay in the middle due to the heaving. Describe tight feel. 3. ONSET: "When did the chest pain begin?" (Minutes, hours or days)       6 times over last few days. 4. PATTERN: "Does the pain come and go, or has it been constant since it started?"  "Does it get worse with exertion?"       Same as in any other time.  Exertion doe not change it 5. DURATION:  "How long does it last" (e.g., seconds, minutes, hours)     5 minutes 6. SEVERITY: "How bad is the pain?"  (e.g., Scale 1-10; mild, moderate, or severe)    - MILD (1-3): doesn't interfere with normal activities     - MODERATE (4-7): interferes with normal activities or awakens from sleep    - SEVERE (8-10): excruciating pain, unable to do any normal activities        Pain is 8  when happening  a  7. CARDIAC RISK FACTORS: "Do you have any history of heart problems or risk factors for heart disease?" (e.g., angina, prior heart attack; diabetes, high blood pressure, high cholesterol, smoker, or strong family history of heart disease)      high blood pressure and High cholesterol - both managed by medication. Has a murmur . Recently started  two ibuprofen on a daily. 8. PULMONARY RISK FACTORS: "Do you have any history of lung disease?"  (e.g., blood clots in lung, asthma, emphysema, birth control pills)      Denies 9. CAUSE: "What do you think is causing the chest pain?"      Stress-   4 cat trying to helping them. 10. OTHER SYMPTOMS: "Do you have any other symptoms?" (e.g., dizziness, nausea, vomiting, sweating, fever, difficulty breathing, cough)        Denies 11. PREGNANCY: "Is there any chance you are pregnant?" "When was your last menstrual period?"  NA  Protocols used: Chest Pain-A-AH

## 2023-09-26 ENCOUNTER — Encounter: Payer: Self-pay | Admitting: Family Medicine

## 2023-09-26 ENCOUNTER — Encounter: Payer: PPO | Admitting: Gastroenterology

## 2023-09-26 ENCOUNTER — Ambulatory Visit (INDEPENDENT_AMBULATORY_CARE_PROVIDER_SITE_OTHER): Payer: PPO | Admitting: Family Medicine

## 2023-09-26 VITALS — BP 162/82 | HR 96 | Temp 98.4°F | Ht 66.0 in | Wt 199.5 lb

## 2023-09-26 DIAGNOSIS — R079 Chest pain, unspecified: Secondary | ICD-10-CM | POA: Diagnosis not present

## 2023-09-26 LAB — CBC WITH DIFFERENTIAL/PLATELET
Basophils Absolute: 0 10*3/uL (ref 0.0–0.1)
Basophils Relative: 0.4 % (ref 0.0–3.0)
Eosinophils Absolute: 0.1 10*3/uL (ref 0.0–0.7)
Eosinophils Relative: 1.2 % (ref 0.0–5.0)
HCT: 40.1 % (ref 36.0–46.0)
Hemoglobin: 13.1 g/dL (ref 12.0–15.0)
Lymphocytes Relative: 18.7 % (ref 12.0–46.0)
Lymphs Abs: 1.2 10*3/uL (ref 0.7–4.0)
MCHC: 32.6 g/dL (ref 30.0–36.0)
MCV: 91.8 fL (ref 78.0–100.0)
Monocytes Absolute: 0.6 10*3/uL (ref 0.1–1.0)
Monocytes Relative: 9 % (ref 3.0–12.0)
Neutro Abs: 4.5 10*3/uL (ref 1.4–7.7)
Neutrophils Relative %: 70.7 % (ref 43.0–77.0)
Platelets: 310 10*3/uL (ref 150.0–400.0)
RBC: 4.37 Mil/uL (ref 3.87–5.11)
RDW: 14.8 % (ref 11.5–15.5)
WBC: 6.3 10*3/uL (ref 4.0–10.5)

## 2023-09-26 LAB — HEPATIC FUNCTION PANEL
ALT: 10 U/L (ref 0–35)
AST: 16 U/L (ref 0–37)
Albumin: 4.6 g/dL (ref 3.5–5.2)
Alkaline Phosphatase: 89 U/L (ref 39–117)
Bilirubin, Direct: 0.1 mg/dL (ref 0.0–0.3)
Total Bilirubin: 0.4 mg/dL (ref 0.2–1.2)
Total Protein: 6.8 g/dL (ref 6.0–8.3)

## 2023-09-26 LAB — BASIC METABOLIC PANEL
BUN: 11 mg/dL (ref 6–23)
CO2: 29 meq/L (ref 19–32)
Calcium: 9.4 mg/dL (ref 8.4–10.5)
Chloride: 102 meq/L (ref 96–112)
Creatinine, Ser: 0.7 mg/dL (ref 0.40–1.20)
GFR: 86.19 mL/min (ref >60.00)
Glucose, Bld: 113 mg/dL — ABNORMAL HIGH (ref 70–99)
Potassium: 4.6 meq/L (ref 3.5–5.1)
Sodium: 139 meq/L (ref 135–145)

## 2023-09-26 LAB — HEMOGLOBIN A1C: Hgb A1c MFr Bld: 6 % (ref 4.6–6.5)

## 2023-09-26 LAB — TSH: TSH: 1.29 u[IU]/mL (ref 0.35–5.50)

## 2023-09-26 NOTE — Patient Instructions (Signed)
We will notify you of your lab results and decide on the next steps Cardiology should call you to schedule your appt START an 81mg  Aspirin daily IF you again have chest pain/pressure- please go to the ER for evaluation Call with any questions or concerns Hang in there!

## 2023-09-26 NOTE — Progress Notes (Signed)
   Subjective:    Patient ID: Becky Lawson, female    DOB: 11-08-1950, 73 y.o.   MRN: [id number]  HPI CP- pt reports she started having CP ~3 weeks ago.  Pain is episodic.  Has had 2 episodes in the last week.  Is under considerable stress.  Last night pain was severe and lasted for 12 minutes.  Pain then eases off spontaneously.  Pain is described as 'a real bad pressure' and is substernal.  Last night pain radiated up into jaw.  Had associated SOB.  No diaphoresis or nausea.  No dizziness.    Hx of HTN, hyperlipidemia, diastolic dysfxn w/ severely dilated L atria (ECHO 06/21/22)  Pt is currently pain free.  Last week was at [facility] when she developed pain and had to sit down.  'it was real bad'.   Review of Systems For ROS see HPI     Objective:   Physical Exam Vitals reviewed.  Constitutional:      General: She is not in acute distress.    Appearance: She is well-developed. She is not ill-appearing.  HENT:     Head: Normocephalic and atraumatic.  Eyes:     Conjunctiva/sclera: Conjunctivae normal.     Pupils: Pupils are equal, round, and reactive to light.  Neck:     Thyroid: No thyromegaly.  Cardiovascular:     Rate and Rhythm: Normal rate and regular rhythm.     Heart sounds: Murmur (II/VI SEM RUSB) heard.  Pulmonary:     Effort: Pulmonary effort is normal. No respiratory distress.     Breath sounds: Normal breath sounds.  Abdominal:     General: There is no distension.     Palpations: Abdomen is soft.     Tenderness: There is no abdominal tenderness.  Musculoskeletal:     Cervical back: Normal range of motion and neck supple.  Lymphadenopathy:     Cervical: No cervical adenopathy.  Skin:    General: Skin is warm and dry.  Neurological:     Mental Status: She is alert and oriented to person, place, and time.  Psychiatric:        Behavior: Behavior normal.           Assessment & Plan:   CP- new.  Pt reports sxs started ~3 weeks ago.  Episodic.  Seems  to occur during times of emotional stress or physical exertion- which is concerning for angina.  She initially thought it was her GERD but taking Mylanta or Maalox did not help.  She is asymptomatic here in office and EKG is essentially unchanged from 2022.  Told her my concern for angina- will get Troponin level to see if there was any minor ischemia last night.  Will place stat referral to cardiology.  She was instructed very clearly that if she has additional CP she needs to go to the ER immediately.  Pt expressed understanding and is in agreement w/ plan.

## 2023-09-27 ENCOUNTER — Ambulatory Visit: Payer: PPO | Admitting: Internal Medicine

## 2023-09-27 ENCOUNTER — Telehealth: Payer: Self-pay

## 2023-09-27 DIAGNOSIS — M51362 Other intervertebral disc degeneration, lumbar region with discogenic back pain and lower extremity pain: Secondary | ICD-10-CM | POA: Diagnosis not present

## 2023-09-27 DIAGNOSIS — M25512 Pain in left shoulder: Secondary | ICD-10-CM | POA: Diagnosis not present

## 2023-09-27 DIAGNOSIS — M25552 Pain in left hip: Secondary | ICD-10-CM | POA: Diagnosis not present

## 2023-09-27 DIAGNOSIS — M5416 Radiculopathy, lumbar region: Secondary | ICD-10-CM | POA: Diagnosis not present

## 2023-09-27 DIAGNOSIS — Z79891 Long term (current) use of opiate analgesic: Secondary | ICD-10-CM | POA: Diagnosis not present

## 2023-09-27 DIAGNOSIS — M47896 Other spondylosis, lumbar region: Secondary | ICD-10-CM | POA: Diagnosis not present

## 2023-09-27 LAB — TROPONIN I: Troponin I: 23 ng/L (ref <=47)

## 2023-09-27 NOTE — Telephone Encounter (Signed)
-----   Message from Becky Lawson sent at 09/26/2023  6:53 PM EST ----- Labs are stable and look good.  We are still waiting on the result of the troponin (which will tell us if there was any strain on the heart yesterday)

## 2023-09-28 ENCOUNTER — Encounter: Payer: Self-pay | Admitting: Internal Medicine

## 2023-09-28 ENCOUNTER — Ambulatory Visit: Payer: PPO | Attending: Internal Medicine | Admitting: Internal Medicine

## 2023-09-28 VITALS — BP 138/78 | HR 88 | Ht 64.5 in | Wt 200.0 lb

## 2023-09-28 DIAGNOSIS — R079 Chest pain, unspecified: Secondary | ICD-10-CM

## 2023-09-28 DIAGNOSIS — R011 Cardiac murmur, unspecified: Secondary | ICD-10-CM | POA: Diagnosis not present

## 2023-09-28 DIAGNOSIS — I10 Essential (primary) hypertension: Secondary | ICD-10-CM | POA: Diagnosis not present

## 2023-09-28 MED ORDER — METOPROLOL TARTRATE 100 MG PO TABS: 100.0000 mg | ORAL_TABLET | ORAL | 0 refills | Status: DC

## 2023-09-28 NOTE — Patient Instructions (Signed)
Medication Instructions:  No changes  *If you need a refill on your cardiac medications before your next appointment, please call your pharmacy*   Lab Work: BMP If you have labs (blood work) drawn today and your tests are completely normal, you will receive your results only by: MyChart Message (if you have MyChart) OR A paper copy in the mail If you have any lab test that is abnormal or we need to change your treatment, we will call you to review the results.   Testing/Procedures:   Your cardiac CT will be scheduled at one of the below locations:   [facility] [address] [place], [place] [address] [phone]    If scheduled at [facility], please arrive at the [facility] and Children's Entrance (Entrance C2) of [facility] 30 minutes prior to test start time. You can use the FREE valet parking offered at entrance C (encouraged to control the heart rate for the test)  Proceed to the [facility] Radiology Department (first floor) to check-in and test prep.  All radiology patients and guests should use entrance C2 at [facility], accessed from [facility], even though the hospital's physical address listed is [address]    If scheduled at [facility] or [facility], please arrive 15 mins early for check-in and test prep.  There is spacious parking and easy access to the radiology department from the [facility] entrance. Please enter here and check-in with the desk attendant.   Please follow these instructions carefully (unless otherwise directed):  An IV will be required for this test and Nitroglycerin will be given.  Hold all erectile dysfunction medications at least 3 days (72 hrs) prior to test. (Ie viagra, cialis, sildenafil, tadalafil, etc)   On the Night Before the Test: Be sure to Drink plenty of water. Do not consume any  caffeinated/decaffeinated beverages or chocolate 12 hours prior to your test. Do not take any antihistamines 12 hours prior to your test.   On the Day of the Test: Drink plenty of water until 1 hour prior to the test. Do not eat any food 1 hour prior to test. You may take your regular medications prior to the test.  Take metoprolol (Lopressor) two hours prior to test. If you take Furosemide/Hydrochlorothiazide/Spironolactone/Chlorthalidone, please HOLD on the morning of the test. Patients who wear a continuous glucose monitor MUST remove the device prior to scanning. FEMALES- please wear underwire-free bra if available, avoid dresses & tight clothing         After the Test: Drink plenty of water. After receiving IV contrast, you may experience a mild flushed feeling. This is normal. On occasion, you may experience a mild rash up to 24 hours after the test. This is not dangerous. If this occurs, you can take Benadryl 25 mg and increase your fluid intake. If you experience trouble breathing, this can be serious. If it is severe call 911 IMMEDIATELY. If it is mild, please call our office.  We will call to schedule your test 2-4 weeks out understanding that some insurance companies will need an authorization prior to the service being performed.   For more information and frequently asked questions, please visit our website : [email or web address]  For non-scheduling related questions, please contact the cardiac imaging nurse navigator should you have any questions/concerns: Cardiac Imaging Nurse Navigators Direct Office Dial: [phone]   For scheduling needs, including cancellations and rescheduling, please  call [place], [phone].    Follow-Up: At [facility], you and your health needs are our priority.  As part of our continuing mission to provide you with exceptional heart care, we have created designated Provider Care Teams.  These Care Teams include your  primary Cardiologist (physician) and Advanced Practice Providers (APPs -  Physician Assistants and Nurse Practitioners) who all work together to provide you with the care you need, when you need it.  We recommend signing up for the patient portal called "MyChart".  Sign up information is provided on this After Visit Summary.  MyChart is used to connect with patients for Virtual Visits (Telemedicine).  Patients are able to view lab/test results, encounter notes, upcoming appointments, etc.  Non-urgent messages can be sent to your provider as well.   To learn more about what you can do with MyChart, go to [email or web address].    Your next appointment:   Follow up as needed depending on test results   Provider:   Any APP   Other Instructions

## 2023-09-28 NOTE — Progress Notes (Signed)
  Cardiology Office Note:  .   Date:  09/28/2023  ID:  Becky Lawson, DOB Jun 04, 1951, MRN [id number] PCP: Shade Flood, MD  [facility] Providers Cardiologist:  None    History of Present Illness: .   Becky Lawson is a 73 y.o. female with pre-DM A1c 6.0, Htn, HLD smoked in the past, referral for CP a few weeks ago at [facility]. She saw her PCP 09/26/2023 and reported symptoms. A troponin was ordered at that visit which was negative. Don't see the EKG.  She notes that she has had intermittent chest pain for the past three weeks. The pain is described as a sudden, heavy pressure in the middle of the chest, sometimes radiating to the chin. Two episodes in the past week were particularly severe. Some episodes have occurred during physical activity, such as walking during a [employer] class, while others have occurred at rest, including one episode that woke the patient from sleep. The patient also reports shortness of breath during these episodes, particularly when moving. The patient has noticed recent swelling in the feet and ankles, but denies difficulty breathing while lying flat or nocturnal dyspnea. The patient has a history of smoking, but quit decades ago.      She denies pitting lower extremity edema, PND or orthopnea.   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  ROS:  per HPI otherwise negative   Studies Reviewed: [name]        EKG Interpretation Date/Time:  Thursday September 28 2023 13:24:34 EST Ventricular Rate:  88 PR Interval:  138 QRS Duration:  76 QT Interval:  322 QTC Calculation: 389 R Axis:   -1  Text Interpretation: Normal sinus rhythm Minimal voltage criteria for LVH, may be normal variant ( R in aVL ) Nonspecific ST and T wave abnormality When compared with ECG of 21-Jul-2021 14:37, No significant change was found Confirmed by Carolan Clines ([id number]) on 09/28/2023 3:10:31 PM  Risk  Assessment/Calculations:        Physical Exam:   VS:   Vitals:   09/28/23 1321  BP: 138/78  Pulse: 88    Wt Readings from Last 3 Encounters:  09/26/23 199 lb 8 oz (90.5 kg)  09/18/23 198 lb (89.8 kg)  06/26/23 201 lb 3.2 oz (91.3 kg)    GEN: Well nourished, well developed in no acute distress NECK: No JVD; No carotid bruits CARDIAC: RRR, no murmurs, rubs, gallops RESPIRATORY:  Clear to auscultation without rales, wheezing or rhonchi  ABDOMEN: Soft, non-tender, non-distended EXTREMITIES:  No edema; No deformity   ASSESSMENT AND PLAN: .   Possible Cardiac Chest Pain New onset of chest pain for the past three weeks, associated with exertion and at rest. Pain described as pressure in the middle of the chest, radiating to the chin. Associated with shortness of breath. No diaphoresis. EKG unremarkable. Her CVD risk includes age and hx of smoking. Suspect this is more likely reflux but will assess considering symptoms with activity and risk factors. -Order coronary CT angiography to evaluate for coronary artery disease.  Mild MR Continue surveillance, can repeat study in 3-5 years       Dispo: Follow up PRN, pending results  Signed, Delane Wessinger, Alben Spittle, MD

## 2023-09-29 DIAGNOSIS — M25512 Pain in left shoulder: Secondary | ICD-10-CM | POA: Diagnosis not present

## 2023-10-04 DIAGNOSIS — M25512 Pain in left shoulder: Secondary | ICD-10-CM | POA: Diagnosis not present

## 2023-10-05 DIAGNOSIS — M25532 Pain in left wrist: Secondary | ICD-10-CM | POA: Diagnosis not present

## 2023-10-06 DIAGNOSIS — M25512 Pain in left shoulder: Secondary | ICD-10-CM | POA: Diagnosis not present

## 2023-10-10 DIAGNOSIS — R079 Chest pain, unspecified: Secondary | ICD-10-CM | POA: Diagnosis not present

## 2023-10-10 DIAGNOSIS — I10 Essential (primary) hypertension: Secondary | ICD-10-CM | POA: Diagnosis not present

## 2023-10-11 ENCOUNTER — Telehealth (HOSPITAL_COMMUNITY): Payer: Self-pay | Admitting: *Deleted

## 2023-10-11 ENCOUNTER — Encounter (HOSPITAL_COMMUNITY): Payer: Self-pay

## 2023-10-11 DIAGNOSIS — M25512 Pain in left shoulder: Secondary | ICD-10-CM | POA: Diagnosis not present

## 2023-10-11 NOTE — Telephone Encounter (Signed)
Reaching out to patient to offer assistance regarding upcoming cardiac imaging study; pt verbalizes understanding of appt date/time, parking situation and where to check in, pre-test NPO status and medications ordered, and verified current allergies; name and call back number provided for further questions should they arise  Larey Brick RN Navigator Cardiac Imaging [facility] [phone] office [phone] cell  Patient to hold her dextroamphetamine and take 100mg  metoprolol tartrate two hours prior to her cardiac CT scan. She is aware to arrive at 1:30 PM.

## 2023-10-12 ENCOUNTER — Ambulatory Visit (HOSPITAL_COMMUNITY)
Admission: RE | Admit: 2023-10-12 | Discharge: 2023-10-12 | Disposition: A | Discharge status: Home / Self Care | Payer: PPO | Source: Ambulatory Visit | Attending: Internal Medicine | Admitting: Internal Medicine

## 2023-10-12 DIAGNOSIS — R079 Chest pain, unspecified: Secondary | ICD-10-CM | POA: Diagnosis not present

## 2023-10-12 DIAGNOSIS — I2510 Atherosclerotic heart disease of native coronary artery without angina pectoris: Secondary | ICD-10-CM | POA: Insufficient documentation

## 2023-10-12 DIAGNOSIS — I10 Essential (primary) hypertension: Secondary | ICD-10-CM | POA: Insufficient documentation

## 2023-10-12 DIAGNOSIS — R011 Cardiac murmur, unspecified: Secondary | ICD-10-CM | POA: Insufficient documentation

## 2023-10-12 MED ORDER — IOHEXOL 350 MG/ML SOLN
95.0000 mL | Freq: Once | INTRAVENOUS | Status: AC | PRN
  Administered 2023-10-12: 95 mL via INTRAVENOUS

## 2023-10-12 MED ORDER — NITROGLYCERIN 0.4 MG SL SUBL
0.8000 mg | SUBLINGUAL_TABLET | Freq: Once | SUBLINGUAL | Status: AC
  Administered 2023-10-12: 0.8 mg via SUBLINGUAL

## 2023-10-12 MED ORDER — METOPROLOL TARTRATE 5 MG/5ML IV SOLN: 10.0000 mg | Freq: Once | INTRAVENOUS | Status: DC | PRN

## 2023-10-12 MED ORDER — METOPROLOL TARTRATE 5 MG/5ML IV SOLN
INTRAVENOUS | Status: AC
  Filled 2023-10-12: qty 10

## 2023-10-12 MED ORDER — NITROGLYCERIN 0.4 MG SL SUBL
SUBLINGUAL_TABLET | SUBLINGUAL | Status: AC
  Filled 2023-10-12: qty 2

## 2023-10-12 MED ORDER — DILTIAZEM HCL 25 MG/5ML IV SOLN: 10.0000 mg | INTRAVENOUS | Status: DC | PRN

## 2023-10-13 ENCOUNTER — Ambulatory Visit (HOSPITAL_COMMUNITY)
Admission: RE | Admit: 2023-10-13 | Discharge: 2023-10-13 | Disposition: A | Discharge status: Home / Self Care | Payer: PPO | Source: Ambulatory Visit | Attending: Cardiology

## 2023-10-13 ENCOUNTER — Other Ambulatory Visit: Payer: Self-pay | Admitting: Cardiology

## 2023-10-13 DIAGNOSIS — R931 Abnormal findings on diagnostic imaging of heart and coronary circulation: Secondary | ICD-10-CM

## 2023-10-13 DIAGNOSIS — M25512 Pain in left shoulder: Secondary | ICD-10-CM | POA: Diagnosis not present

## 2023-10-17 ENCOUNTER — Telehealth: Payer: Self-pay | Admitting: *Deleted

## 2023-10-17 ENCOUNTER — Other Ambulatory Visit: Payer: Self-pay | Admitting: Internal Medicine

## 2023-10-17 DIAGNOSIS — E875 Hyperkalemia: Secondary | ICD-10-CM

## 2023-10-17 MED ORDER — LOKELMA 10 G PO PACK: 10.0000 g | PACK | Freq: Once | ORAL | Status: AC

## 2023-10-17 NOTE — Telephone Encounter (Signed)
-----   Message from Carolan Clines E sent at 10/17/2023  8:42 AM EST ----- Please let Mrs. Biscoe know that her potassium is elevated at 5.7. Please prescribe her one doses of kayexalate and then please repeat BMET in 2 weeks

## 2023-10-17 NOTE — Telephone Encounter (Signed)
Spoke with pt, aware of results and recommendations. Lokelma placed at the front desk for patient pick up and lab orders also placed in the bag. Patient reports eating an orange daily.

## 2023-10-18 ENCOUNTER — Telehealth: Payer: Self-pay | Admitting: *Deleted

## 2023-10-18 DIAGNOSIS — R931 Abnormal findings on diagnostic imaging of heart and coronary circulation: Secondary | ICD-10-CM

## 2023-10-18 DIAGNOSIS — E785 Hyperlipidemia, unspecified: Secondary | ICD-10-CM

## 2023-10-18 LAB — BASIC METABOLIC PANEL
BUN/Creatinine Ratio: 16 (ref 12–28)
BUN: 15 mg/dL (ref 8–27)
CO2: 19 mmol/L — ABNORMAL LOW (ref 20–29)
Calcium: 9.7 mg/dL (ref 8.7–10.3)
Chloride: 120 mmol/L (ref 96–106)
Creatinine, Ser: 0.91 mg/dL (ref 0.57–1.00)
Glucose: 119 mg/dL — ABNORMAL HIGH (ref 70–99)
Potassium: 5.7 mmol/L — ABNORMAL HIGH (ref 3.5–5.2)
Sodium: 144 mmol/L (ref 134–144)
eGFR: 67 mL/min/{1.73_m2} (ref >59)

## 2023-10-18 MED ORDER — ATORVASTATIN CALCIUM 80 MG PO TABS: 80.0000 mg | ORAL_TABLET | Freq: Every day | ORAL | 3 refills | Status: AC

## 2023-10-18 NOTE — Telephone Encounter (Signed)
[name], MD     Please increase her Lipitor to 80, please order an echocardiogram; the indication is elevated CAC.  She will continue taking a baby aspirin.  Please schedule a follow-up appointment with an APP in 1 month.Sherron Monday with pt, aware of the above. Echo order placed. Follow up scheduled

## 2023-10-20 ENCOUNTER — Ambulatory Visit: Payer: PPO

## 2023-10-20 DIAGNOSIS — M25512 Pain in left shoulder: Secondary | ICD-10-CM | POA: Diagnosis not present

## 2023-10-23 ENCOUNTER — Ambulatory Visit (INDEPENDENT_AMBULATORY_CARE_PROVIDER_SITE_OTHER): Payer: PPO

## 2023-10-23 DIAGNOSIS — Z23 Encounter for immunization: Secondary | ICD-10-CM | POA: Diagnosis not present

## 2023-10-23 DIAGNOSIS — G4733 Obstructive sleep apnea (adult) (pediatric): Secondary | ICD-10-CM | POA: Diagnosis not present

## 2023-10-25 ENCOUNTER — Ambulatory Visit (HOSPITAL_COMMUNITY): Payer: PPO

## 2023-10-27 ENCOUNTER — Telehealth: Payer: Self-pay | Admitting: Internal Medicine

## 2023-10-27 ENCOUNTER — Telehealth: Payer: Self-pay | Admitting: Behavioral Health

## 2023-10-27 ENCOUNTER — Other Ambulatory Visit: Payer: Self-pay | Admitting: Internal Medicine

## 2023-10-27 ENCOUNTER — Encounter: Payer: Self-pay | Admitting: *Deleted

## 2023-10-27 ENCOUNTER — Other Ambulatory Visit (HOSPITAL_COMMUNITY): Payer: Self-pay

## 2023-10-27 ENCOUNTER — Ambulatory Visit (HOSPITAL_COMMUNITY)
Admission: RE | Admit: 2023-10-27 | Discharge: 2023-10-27 | Disposition: A | Discharge status: Home / Self Care | Payer: PPO | Source: Ambulatory Visit | Attending: Gastroenterology | Admitting: Gastroenterology

## 2023-10-27 DIAGNOSIS — K219 Gastro-esophageal reflux disease without esophagitis: Secondary | ICD-10-CM | POA: Diagnosis not present

## 2023-10-27 DIAGNOSIS — B1920 Unspecified viral hepatitis C without hepatic coma: Secondary | ICD-10-CM | POA: Diagnosis not present

## 2023-10-27 DIAGNOSIS — Z8619 Personal history of other infectious and parasitic diseases: Secondary | ICD-10-CM | POA: Insufficient documentation

## 2023-10-27 DIAGNOSIS — R931 Abnormal findings on diagnostic imaging of heart and coronary circulation: Secondary | ICD-10-CM

## 2023-10-27 MED ORDER — ASPIRIN 81 MG PO TBEC
81.0000 mg | DELAYED_RELEASE_TABLET | Freq: Every day | ORAL | 12 refills | Status: AC
  Filled 2023-10-27: qty 30, 30d supply, fill #0

## 2023-10-27 NOTE — Telephone Encounter (Signed)
Called Becky Lawson to follow-up on her symptoms.  Plan is to start a baby aspirin and increase her Lipitor to 80 mg daily , however have not been able to reach her.  She is a FFR positive distal left circumflex lesion, if her symptoms have improved we will continue continue with medical management and close follow-up.

## 2023-10-27 NOTE — Telephone Encounter (Signed)
Patient states she was returning dr. Wyline Mood call. Please advise

## 2023-10-30 ENCOUNTER — Other Ambulatory Visit (HOSPITAL_COMMUNITY): Payer: Self-pay

## 2023-11-01 DIAGNOSIS — E875 Hyperkalemia: Secondary | ICD-10-CM | POA: Diagnosis not present

## 2023-11-02 LAB — BASIC METABOLIC PANEL
BUN/Creatinine Ratio: 21 (ref 12–28)
BUN: 16 mg/dL (ref 8–27)
CO2: 22 mmol/L (ref 20–29)
Calcium: 9.1 mg/dL (ref 8.7–10.3)
Chloride: 104 mmol/L (ref 96–106)
Creatinine, Ser: 0.77 mg/dL (ref 0.57–1.00)
Glucose: 121 mg/dL — ABNORMAL HIGH (ref 70–99)
Potassium: 5.1 mmol/L (ref 3.5–5.2)
Sodium: 141 mmol/L (ref 134–144)
eGFR: 82 mL/min/{1.73_m2} (ref >59)

## 2023-11-05 ENCOUNTER — Encounter: Payer: Self-pay | Admitting: Internal Medicine

## 2023-11-08 ENCOUNTER — Ambulatory Visit (HOSPITAL_COMMUNITY): Payer: PPO | Attending: Internal Medicine

## 2023-11-08 DIAGNOSIS — Z87891 Personal history of nicotine dependence: Secondary | ICD-10-CM | POA: Diagnosis not present

## 2023-11-08 DIAGNOSIS — I2510 Atherosclerotic heart disease of native coronary artery without angina pectoris: Secondary | ICD-10-CM | POA: Diagnosis not present

## 2023-11-08 DIAGNOSIS — E785 Hyperlipidemia, unspecified: Secondary | ICD-10-CM | POA: Insufficient documentation

## 2023-11-08 DIAGNOSIS — R079 Chest pain, unspecified: Secondary | ICD-10-CM | POA: Insufficient documentation

## 2023-11-08 DIAGNOSIS — I340 Nonrheumatic mitral (valve) insufficiency: Secondary | ICD-10-CM | POA: Insufficient documentation

## 2023-11-08 DIAGNOSIS — I10 Essential (primary) hypertension: Secondary | ICD-10-CM | POA: Insufficient documentation

## 2023-11-08 DIAGNOSIS — R931 Abnormal findings on diagnostic imaging of heart and coronary circulation: Secondary | ICD-10-CM | POA: Insufficient documentation

## 2023-11-08 LAB — ECHOCARDIOGRAM COMPLETE
Area-P 1/2: 2.87 cm2
S' Lateral: 2.5 cm

## 2023-11-09 ENCOUNTER — Other Ambulatory Visit (HOSPITAL_COMMUNITY): Payer: Self-pay

## 2023-11-14 NOTE — Progress Notes (Unsigned)
 Cardiology Office Note    Date:  11/15/2023  ID:  DEJAE BERNET, DOB 1951-04-05, MRN [id number] PCP:  Shade Flood, MD  Cardiologist:  None  Electrophysiologist:  None   Chief Complaint: Follow up for CAD   History of Present Illness: .    LIBERTY SETO is a 73 y.o. female with visit-pertinent history of prediabetes, hypertension, hyperlipidemia, prior tobacco use.  First evaluated on 09/28/2023 by Dr. Wyline Mood for evaluation of chest pain.  At office visit patient reported that she had had intermittent chest pain for the prior 3 weeks, pain described as a sudden, heavy pressure in the middle chest that sometimes radiated to the chin.  Coronary CTA on 10/12/2023 indicated at least moderate stenosis, coronary calcium score of 3084, 99th percentile for age, sex and race matched controls.  Total plaque volume 1268 mm3, 96 percentile for age and sex matched control.  CT FFR analysis did not show any significant focal stenosis, there was gradual decrease in FFR across LCx system, FFR drops when the vessel becomes smaller in caliber after OM2 but there is no discrete point of stenosis, proximal FFR 0.98, mid FFR after OM two 0.76, distal FFR of 0.7.  Echocardiogram on 11/08/2023 indicated LVEF of 65 to 70%, no RWMA, grade 1 diastolic dysfunction, RV size and function was normal, LA was moderately dilated, aortic valve sclerosis was evident without aortic valve stenosis.  Today patient presents for follow-up.  She reports that she has been doing well.  She denies any further episodes similar to what brought her into the clinic initially.  She denies any further chest pain.  She does note some ongoing fatigue and dyspnea with ongoing exertion.  She does not feel she is able to do the same activities as she previously did.  She reports that she recently had norovirus and has struggled with recurring.  She reports that she is not very active but is trying to start walking regularly.  ROS: .   Today she denies  chest pain, palpitations, melena, hematuria, hemoptysis, diaphoresis, weakness, presyncope, syncope, orthopnea, and PND.  All other systems are reviewed and otherwise negative. Studies Reviewed: [name]    EKG:  EKG is not ordered today.  CV Studies: Cardiac studies reviewed are outlined and summarized above. Otherwise please see EMR for full report. Cardiac Studies & Procedures   ______________________________________________________________________________________________     ECHOCARDIOGRAM  ECHOCARDIOGRAM COMPLETE 11/08/2023  Narrative ECHOCARDIOGRAM REPORT    Patient Name:   TAMSEN REIST  Date of Exam: 11/08/2023 Medical Rec #:  [id number]     Height:       64.5 in Accession #:    [id number]    Weight:       200.0 lb Date of Birth:  [birth year]     BSA:          1.967 m Patient Age:    72 years      BP:           138/78 mmHg Patient Gender: F             HR:           108 bpm. Exam Location:  [address]  Procedure: 2D Echo, Cardiac Doppler and Color Doppler (Both Spectral and Color Flow Doppler were utilized during procedure).  Indications:    R93.1 CAD  History:        Patient has prior history of Echocardiogram examinations, most recent 06/21/2022. CAD, Signs/Symptoms:Murmur and Chest Pain;  Risk Factors:Hypertension, Dyslipidemia and Former Smoker.  Sonographer:    Samule Ohm RDCS Referring Phys: [phone] MARY E BRANCH  IMPRESSIONS   1. Left ventricular ejection fraction, by estimation, is 65 to 70%. The left ventricle has normal function. The left ventricle has no regional wall motion abnormalities. Left ventricular diastolic parameters are consistent with Grade I diastolic dysfunction (impaired relaxation). 2. Right ventricular systolic function is normal. The right ventricular size is normal. 3. Left atrial size was moderately dilated. 4. The mitral valve is normal in structure. Mild mitral valve regurgitation. No evidence of mitral stenosis. 5. The aortic valve is  normal in structure. There is mild calcification of the aortic valve. Aortic valve regurgitation is not visualized. Aortic valve sclerosis/calcification is present, without any evidence of aortic stenosis. 6. There is high flow/turbulence over the pulmonic valve but no evidence of stenosis. 7. The inferior vena cava is normal in size with greater than 50% respiratory variability, suggesting right atrial pressure of 3 mmHg.  FINDINGS Left Ventricle: Left ventricular ejection fraction, by estimation, is 65 to 70%. The left ventricle has normal function. The left ventricle has no regional wall motion abnormalities. The left ventricular internal cavity size was normal in size. There is no left ventricular hypertrophy. Left ventricular diastolic parameters are consistent with Grade I diastolic dysfunction (impaired relaxation).  Right Ventricle: The right ventricular size is normal. No increase in right ventricular wall thickness. Right ventricular systolic function is normal.  Left Atrium: Left atrial size was moderately dilated.  Right Atrium: Right atrial size was normal in size.  Pericardium: There is no evidence of pericardial effusion.  Mitral Valve: The mitral valve is normal in structure. Mild mitral annular calcification. Mild mitral valve regurgitation. No evidence of mitral valve stenosis.  Tricuspid Valve: The tricuspid valve is normal in structure. Tricuspid valve regurgitation is mild . No evidence of tricuspid stenosis.  Aortic Valve: The aortic valve is normal in structure. There is mild calcification of the aortic valve. Aortic valve regurgitation is not visualized. Aortic valve sclerosis/calcification is present, without any evidence of aortic stenosis.  Pulmonic Valve: The pulmonic valve was normal in structure. Pulmonic valve regurgitation is not visualized. No evidence of pulmonic stenosis.  Aorta: The aortic root is normal in size and structure.  Pulmonary Artery: There is  high flow/turbulence over the pulmonic valve but no evidence of stenosis.  Venous: The inferior vena cava is normal in size with greater than 50% respiratory variability, suggesting right atrial pressure of 3 mmHg.  IAS/Shunts: No atrial level shunt detected by color flow Doppler.   LEFT VENTRICLE PLAX 2D LVIDd:         3.60 cm   Diastology LVIDs:         2.50 cm   LV e' medial:    9.25 cm/s LV PW:         1.20 cm   LV E/e' medial:  11.4 LV IVS:        1.50 cm   LV e' lateral:   17.30 cm/s LVOT diam:     2.10 cm   LV E/e' lateral: 6.1 LV SV:         90 LV SV Index:   46 LVOT Area:     3.46 cm   RIGHT VENTRICLE             IVC RV S prime:     19.20 cm/s  IVC diam: 0.80 cm TAPSE (M-mode): 1.9 cm RVSP:  26.2 mmHg  LEFT ATRIUM             Index        RIGHT ATRIUM           Index LA diam:        4.10 cm 2.08 cm/m   RA Pressure: 3.00 mmHg LA Vol (A2C):   69.8 ml 35.49 ml/m  RA Area:     13.60 cm LA Vol (A4C):   78.3 ml 39.81 ml/m  RA Volume:   32.80 ml  16.68 ml/m LA Biplane Vol: 79.7 ml 40.52 ml/m AORTIC VALVE LVOT Vmax:   136.00 cm/s LVOT Vmean:  94.600 cm/s LVOT VTI:    0.260 m  AORTA Ao Root diam: 3.30 cm Ao Asc diam:  3.50 cm  MITRAL VALVE                TRICUSPID VALVE MV Area (PHT): 2.87 cm     TR Peak grad:   23.2 mmHg MV Decel Time: 264 msec     TR Vmax:        241.00 cm/s MV E velocity: 105.00 cm/s  Estimated RAP:  3.00 mmHg MV A velocity: 134.00 cm/s  RVSP:           26.2 mmHg MV E/A ratio:  0.78 SHUNTS Systemic VTI:  0.26 m Systemic Diam: 2.10 cm  Arvilla Meres MD Electronically signed by Arvilla Meres MD Signature Date/Time: 11/08/2023/3:36:55 PM    Final      CT SCANS  CT CORONARY MORPH W/CTA COR W/SCORE 10/12/2023  Addendum 10/26/2023  9:16 PM ADDENDUM REPORT: 10/26/2023 21:13  EXAM: OVER-READ INTERPRETATION  CT CHEST  The following report is an over-read performed by radiologist Dr. Curly Shores [facility] on 10/26/2023. This over-read does not include interpretation of cardiac or coronary anatomy or pathology. The coronary CTA interpretation by the cardiologist is attached.  COMPARISON:  None.  FINDINGS: Cardiovascular:  See findings discussed in the body of the report.  Mediastinum/Nodes: No suspicious adenopathy identified. Imaged mediastinal structures are unremarkable.  Lungs/Pleura: There is dependent basilar subsegmental atelectasis. No pneumonia or pulmonary edema. No pleural effusion or pneumothorax.  Upper Abdomen: No acute abnormality.  Musculoskeletal: No chest wall abnormality. No acute osseous findings. There are thoracic degenerative changes.  IMPRESSION:  No acute extracardiac incidental findings.   Electronically Signed By: Layla Maw M.D. On: 10/26/2023 21:13  Narrative HISTORY: Chest pain/anginal equiv, intermediate CAD risk, treadmill candidate  EXAM: Cardiac/Coronary CT  TECHNIQUE: The patient was scanned on a [employer].  PROTOCOL: A 120 kV prospective scan was triggered in the descending thoracic aorta at 111 HU's. Axial non-contrast 3 mm slices were carried out through the heart. The data set was analyzed on a dedicated work station and scored using the Agatston method. Gantry rotation speed was 250 msecs and collimation was 0.6 mm. Heart rate was optimized medically and sl NTG was given. The 3D data set was reconstructed in 5% intervals of the 35-75 % of the R-R cycle. Systolic and diastolic phases were analyzed on a dedicated work station using MPR, MIP and VRT modes. The patient received 95mL OMNIPAQUE IOHEXOL 350 MG/ML SOLN of contrast.  FINDINGS: Coronary calcium score: The patient's coronary artery calcium score is 3084, which places the patient in the 99th percentile.  Coronary arteries: Normal coronary origins.  Co- dominance.  Right Coronary Artery: Normal caliber vessel, gives rise to right PDA.  Diffuse mixed calcified and noncalcified plaque throughout primarily  proximal vessel. Maximum stenosis 25-49% proximal, 1-24% mid, and 1-24% distal.  Left Main Coronary Artery: Normal caliber vessel. No significant plaque or stenosis.  Left Anterior Descending Coronary Artery: Normal caliber vessel. Diffuse mixed calcified and noncalcified plaque throughout vessel. Maximum stenosis 25-49% proximal, 50-69% mid, and 50-69% distal. Gives rise to two small diagonal branches.  Left Circumflex Artery: Normal caliber vessel, gives rise to left PDA. Diffuse mixed calcified and noncalcified plaque throughout vessel. Maximum stenosis 25-49% proximal, 25-49% mid, and 50-69% distal. Gives rise to small first, large second, medium third OM branches.  Aorta: Normal size, 35 mm at the mid ascending aorta (level of the PA bifurcation) measured double oblique. Aortic atherosclerosis. No dissection seen in visualized portions of the aorta.  Aortic Valve: Mild calcifications. Trileaflet.  Other findings:  Normal pulmonary vein drainage into the left atrium.  Normal left atrial appendage without a thrombus.  Normal size of the pulmonary artery.  Normal appearance of the pericardium.  IMPRESSION: 1. CAD with at least moderate stenosis, CADRADS = 3. CT FFR will be performed and reported separately. Heavy calcification limits interpretation in certain segments.  2. Coronary calcium score of 3084. This was 99th percentile for age-, sex-, and race- matched controls.  3. Total plaque volume 1268 mm3 which is 96th percentile for age- and sex- matched controls (calcified plaque 460 mm3; noncalcified plaque 808 mm3). Total plaque volume is extensive.  4. Normal coronary origin with co- dominance.  INTERPRETATION:  CAD-RADS 3: Moderate stenosis (50-69%). Consider symptom-guided anti-ischemic pharmacotherapy as well as risk factor modification per guideline directed care. Additional analysis  with CT FFR will be submitted.  Electronically Signed: By: Jodelle Red M.D. On: 10/13/2023 14:14     ______________________________________________________________________________________________       Current Reported Medications:.    Current Meds  Medication Sig   acetaminophen (TYLENOL) 500 MG tablet Take 2 tablets (1,000 mg total) by mouth every 6 (six) hours.   ALPRAZolam (XANAX) 1 MG tablet TAKE 1/2 TO 1 TABLET BY MOUTH twice daily AS NEEDED FOR ANXIETY   amLODipine (NORVASC) 10 MG tablet Take 1 tablet (10 mg total) by mouth daily.   aspirin EC 81 MG tablet Take 1 tablet (81 mg total) by mouth daily. Swallow whole.   atorvastatin (LIPITOR) 80 MG tablet Take 1 tablet (80 mg total) by mouth daily.   azelastine (ASTELIN) 0.1 % nasal spray Place 1-2 sprays into both nostrils 2 (two) times daily. Use in each nostril as directed   diclofenac (VOLTAREN) 75 MG EC tablet 1 tablet twice a day by oral route.   docusate sodium (COLACE) 100 MG capsule Take 1 capsule (100 mg total) by mouth 2 (two) times daily.   hydrOXYzine (ATARAX) 25 MG tablet TAKE ONE TABLET BY MOUTH every EIGHT hours AS NEEDED   lamoTRIgine (LAMICTAL) 100 MG tablet TAKE TWO TABLETS BY MOUTH EVERY MORNING and TAKE ONE TABLET BY MOUTH EVERY EVENING   levothyroxine (SYNTHROID) 100 MCG tablet TAKE ONE TABLET BY MOUTH BEFORE breakfast   losartan (COZAAR) 50 MG tablet Take 1 tablet (50 mg total) by mouth daily.   nitroGLYCERIN (NITROSTAT) 0.4 MG SL tablet Place 1 tablet (0.4 mg total) under the tongue every 5 (five) minutes as needed for chest pain.   omeprazole (PRILOSEC) 40 MG capsule Take 1 capsule (40 mg total) by mouth 2 (two) times daily.   oxyCODONE-acetaminophen (PERCOCET) 10-325 MG tablet Take 1 tablet by mouth every 4 (four) hours as needed for pain.   polyethylene glycol (  MIRALAX / GLYCOLAX) 17 g packet Take 17 g by mouth daily as needed for mild constipation.   QUEtiapine (SEROQUEL) 50 MG tablet Take 1  tablet (50 mg total) by mouth daily.   traZODone (DESYREL) 150 MG tablet TAKE TWO TABLETS BY MOUTH everyday AT bedtime   Vitamin D, Ergocalciferol, (DRISDOL) 1.25 MG (50000 UNIT) CAPS capsule Take 1 capsule (50,000 Units total) by mouth every 7 (seven) days. Sunday   [DISCONTINUED] atenolol (TENORMIN) 25 MG tablet Take 1 tablet (25 mg total) by mouth 2 (two) times daily.   Current Facility-Administered Medications for the 11/15/23 encounter (Office Visit) with Reather Littler D, NP  Medication   cyanocobalamin ((VITAMIN B-12)) injection 1,000 mcg   Physical Exam:    VS:  BP 132/82 (BP Location: Left Arm, Patient Position: Sitting, Cuff Size: Normal)   Pulse 97   Ht 5\' 4"  (1.626 m)   Wt 190 lb 3.2 oz (86.3 kg)   SpO2 97%   BMI 32.65 kg/m    Wt Readings from Last 3 Encounters:  11/15/23 190 lb 3.2 oz (86.3 kg)  09/28/23 200 lb (90.7 kg)  09/26/23 199 lb 8 oz (90.5 kg)    GEN: Well nourished, well developed in no acute distress NECK: No JVD; No carotid bruits CARDIAC: RRR, no murmurs, rubs, gallops RESPIRATORY:  Clear to auscultation without rales, wheezing or rhonchi  ABDOMEN: Soft, non-tender, non-distended EXTREMITIES:  No edema; No acute deformity     Asessement and Plan:.    CAD: Coronary CTA on 10/12/2023 indicated at least moderate stenosis, coronary calcium score of 3084, 99th percentile for age, sex and race matched controls.  Total plaque volume 1268 mm3, 96 percentile for age and sex matched control.  CT FFR analysis did not show any significant focal stenosis, there was gradual decrease in FFR across LCx system, FFR drops when the vessel becomes smaller in caliber after OM2 but there is no discrete point of stenosis, proximal FFR 0.98, mid FFR after OM two 0.76, distal FFR of 0.7.  Echocardiogram on 11/08/2023 indicated LVEF of 65 to 70%, no RWMA, grade 1 diastolic dysfunction, RV size and function was normal, LA was moderately dilated, aortic valve sclerosis was evident without  aortic valve stenosis. Today patient reports that she is doing well.  She denies any further chest pain.  She does report ongoing shortness of breath with exertion.  She also notes that she has ongoing fatigue that has been present for years.  Discussed starting on further rate implication regiment, patient's heart rate is consistently elevated, today 96 bpm.  She does report she stopped her dextroamphetamine.  Patient has history of significant depression and bipolar disorder, discussed with Pharm.D. initiating beta-blocker therapy, recommended trying atenolol.  Reviewed patient potential for worsening depression, patient reports that she would prefer to try the medication.  She will monitor for symptoms.  Can potentially consider a trial of Imdur if unable to tolerate.  Reviewed indications for sublingual nitroglycerin as needed for chest pain.  Reviewed ED precautions.  Continue aspirin 81 mg daily, Lipitor 80 mg daily, losartan 50 mg daily.  Start atenolol 25 mg daily.  Hyperlipidemia: Last lipid profile on 05/12/2023 indicated total cholesterol of 149, HDL 50, triglycerides 124 and LDL 73.  Continue atorvastatin 80 mg daily.  Will plan for fasting lipid profile and LFTs on follow-up.  Hypertension: Blood pressure today 132/82.  Continue current antihypertensive regimen.    Disposition: F/u with Reather Littler, NP in one month.   Signed, Charlesetta Garibaldi  Armando Gang, NP

## 2023-11-15 ENCOUNTER — Encounter: Payer: Self-pay | Admitting: Cardiology

## 2023-11-15 ENCOUNTER — Encounter: Payer: PPO | Admitting: Gastroenterology

## 2023-11-15 ENCOUNTER — Ambulatory Visit: Payer: PPO | Attending: Cardiology | Admitting: Cardiology

## 2023-11-15 VITALS — BP 132/82 | HR 97 | Ht 64.0 in | Wt 190.2 lb

## 2023-11-15 DIAGNOSIS — R931 Abnormal findings on diagnostic imaging of heart and coronary circulation: Secondary | ICD-10-CM | POA: Diagnosis not present

## 2023-11-15 DIAGNOSIS — I10 Essential (primary) hypertension: Secondary | ICD-10-CM | POA: Diagnosis not present

## 2023-11-15 DIAGNOSIS — E785 Hyperlipidemia, unspecified: Secondary | ICD-10-CM

## 2023-11-15 MED ORDER — NITROGLYCERIN 0.4 MG SL SUBL: 0.4000 mg | SUBLINGUAL_TABLET | SUBLINGUAL | 2 refills | Status: DC | PRN

## 2023-11-15 MED ORDER — ATENOLOL 25 MG PO TABS: 25.0000 mg | ORAL_TABLET | Freq: Every day | ORAL | 3 refills | Status: DC

## 2023-11-15 MED ORDER — ATENOLOL 25 MG PO TABS: 25.0000 mg | ORAL_TABLET | Freq: Two times a day (BID) | ORAL | 3 refills | Status: DC

## 2023-11-15 NOTE — Patient Instructions (Addendum)
 Medication Instructions:  Start Nitroglycerin 1 tablet (0.4 mg total) under the tongue every 5 (five) minutes as needed for chest pain.  Start Atenolol 1 tablet (25 mg total) by mouth daily.  *If you need a refill on your cardiac medications before your next appointment, please call your pharmacy*  Lab Work: No labs  Testing/Procedures: No testing  Follow-Up: At [facility], you and your health needs are our priority.  As part of our continuing mission to provide you with exceptional heart care, we have created designated Provider Care Teams.  These Care Teams include your primary Cardiologist (physician) and Advanced Practice Providers (APPs -  Physician Assistants and Nurse Practitioners) who all work together to provide you with the care you need, when you need it.  We recommend signing up for the patient portal called "MyChart".  Sign up information is provided on this After Visit Summary.  MyChart is used to connect with patients for Virtual Visits (Telemedicine).  Patients are able to view lab/test results, encounter notes, upcoming appointments, etc.  Non-urgent messages can be sent to your provider as well.   To learn more about what you can do with MyChart, go to [email or web address].    Your next appointment:   1 month(s)  Provider:   Reather Littler, NP

## 2023-11-16 ENCOUNTER — Telehealth: Payer: Self-pay | Admitting: Cardiology

## 2023-11-16 ENCOUNTER — Telehealth: Payer: Self-pay | Admitting: Internal Medicine

## 2023-11-16 NOTE — Telephone Encounter (Signed)
 Patient is requesting a provider switch from Dr. Tereso Newcomer to Dr. Erlene Quan.  Please confirm.

## 2023-11-16 NOTE — Telephone Encounter (Signed)
-   Called and spoke with patient, verified name and DOB. Called regarding echocardiogram and coronary CTA results.  We reviewed her calcium scoring and further CTA results.  All questions were answered. - Discussed indication for starting on atenolol, reviewed information that was listed on patient's AVS and written in provider's note.  All questions were answered. -Discussed colonoscopy, requested patient reach out to her gastroenterologist office to see if a preoperative cardiac evaluation is needed. -Discussed supplements, patient notes a PA had previously recommended these.  After further discussion patient does not plan on starting on supplementation at this time. -Patient appreciated call, plans to follow-up as scheduled on 12/18/2023.

## 2023-11-16 NOTE — Telephone Encounter (Signed)
 Spoke to patient. Verified name and DOB. Patient has concerns she would like to address. Patient saw the note on echocardiogram had no significant finds but feels calcium in arteries is a significant find. She stated she did not see her tachycardia under her list of problems and the medications she takes to treat it, I asked patient if she received medication list with AVS she stated she did. She want to know the location of blockage and percentage of blockage. States it wasn't discussed during visit. She is scheduled for a colonoscopy and wants to know if she needs any advise regarding her medication. She wants to start supplements magnesium, CoQ10 and  Ginkgo biloba . I advised patient to not start supplements before speaking to provider. I informed patient her concerns would be forwarded to her provider to review. Patient verbalized understanding.   Josie LPN

## 2023-11-16 NOTE — Telephone Encounter (Signed)
 Patient is wanting to ask additional questions from her appt on yesterday with [facility]

## 2023-11-29 ENCOUNTER — Ambulatory Visit: Payer: PPO | Admitting: Adult Health

## 2023-12-11 ENCOUNTER — Ambulatory Visit
Admission: EM | Admit: 2023-12-11 | Discharge: 2023-12-11 | Disposition: A | Discharge status: Home / Self Care | Attending: Family Medicine | Admitting: Family Medicine

## 2023-12-11 DIAGNOSIS — N390 Urinary tract infection, site not specified: Secondary | ICD-10-CM | POA: Diagnosis not present

## 2023-12-11 LAB — POCT URINALYSIS DIP (MANUAL ENTRY)
Bilirubin, UA: NEGATIVE
Glucose, UA: NEGATIVE mg/dL
Ketones, POC UA: NEGATIVE mg/dL
Nitrite, UA: NEGATIVE
Protein Ur, POC: 30 mg/dL — AB
Spec Grav, UA: 1.025 (ref 1.010–1.025)
Urobilinogen, UA: 0.2 U/dL
pH, UA: 6 (ref 5.0–8.0)

## 2023-12-11 MED ORDER — PHENAZOPYRIDINE HCL 200 MG PO TABS: 200.0000 mg | ORAL_TABLET | Freq: Three times a day (TID) | ORAL | 0 refills | Status: DC

## 2023-12-11 MED ORDER — SULFAMETHOXAZOLE-TRIMETHOPRIM 800-160 MG PO TABS: 1.0000 | ORAL_TABLET | Freq: Two times a day (BID) | ORAL | 0 refills | Status: AC

## 2023-12-11 NOTE — ED Provider Notes (Signed)
 [facility]    CSN: [id number] Arrival date & time: 12/11/23  1305      History   Chief Complaint Chief Complaint  Patient presents with   UTI Symptoms    HPI Becky Lawson is a 73 y.o. female.   Patient is here for possible UTI.  She has had urinary discomfort off/on x 3 weeks, but has worsened the last several days  Painful urination, frequency, urgency.  No abd pain or back pain.  No fevers/chills.        Past Medical History:  Diagnosis Date   Arthritis    Benign cyst of right kidney 2014   2.7 cm right kidney cyst seen on imaging 2014 but was c/o right flank pain with new persistent proteinuria (fortunately hematuria had resolved) so repeat US 11/2016 showed right kidney still with 2.6 cm simple cyst and otherwise nml.   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, genotype 1b, followed at [facility]   Hyperlipidemia    Hypertension    Hypothyroidism    Jaundice 11/01/2012   Pill dysphagia 10/20/2021   Pruritic disorder 02/13/2013   Sleep apnea    Was diagnosed approximately 20 years ago, but does not wear CPAP patient stated "I gave it back.Marland KitchenMarland KitchenI couldn't wear that thing it was horrible"   Spinal stenosis    Thyroid disease    [employer]reesia 06/15/2020   Unintentional weight loss of more than 10 pounds 07/12/2018    Patient Active Problem List   Diagnosis Date Noted   Closed fracture of fifth metatarsal bone 04/21/2023   Lumbar radiculopathy 11/24/2022   Malignant tumor of thyroid gland (HCC) 10/26/2022   Vaginal lesion 09/30/2022   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/05/2022   Status post total replacement of right hip 07/05/2022   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   S/P total left hip arthroplasty 08/03/2021   Abnormal  liver function tests 04/19/2021   Degeneration of lumbar intervertebral disc 05/25/2020   Tremor 07/12/2018   Prediabetes 07/12/2018   Polypharmacy 07/12/2018   Fatty liver 04/18/2017   GERD (gastroesophageal reflux disease) 10/28/2016   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   Chronic throat clearing 06/24/2016   S/P partial thyroidectomy 06/16/2016   Thyroid nodule 02/08/2016   Hx of hepatitis C 05/19/2015   Vitamin D deficiency 05/19/2015   Spinal stenosis of lumbar region 05/19/2015   Hyperlipidemia 05/19/2015   Bipolar disorder (HCC) 05/19/2015   Essential hypertension 09/26/2013   Undiagnosed cardiac murmurs 09/26/2013    Past Surgical History:  Procedure Laterality Date   ABDOMINAL HYSTERECTOMY     COLONOSCOPY W/ POLYPECTOMY     JOINT REPLACEMENT Bilateral    knee   KNEE ARTHROSCOPY Bilateral    PARTIAL HYSTERECTOMY     THYROID LOBECTOMY Left 06/16/2016   THYROID LOBECTOMY Left 06/16/2016   Procedure: LEFT THYROID LOBECTOMY;  Surgeon: Gaynelle Adu, MD;  Location: [facility] OR;  Service: General;  Laterality: Left;   TOTAL HIP ARTHROPLASTY Left 08/03/2021   Procedure: TOTAL HIP ARTHROPLASTY ANTERIOR APPROACH;  Surgeon: Durene Romans, MD;  Location: [facility] ORS;  Service: Orthopedics;  Laterality: Left;   TOTAL HIP ARTHROPLASTY Right 07/05/2022   Procedure: TOTAL HIP ARTHROPLASTY ANTERIOR APPROACH;  Surgeon: Durene Romans, MD;  Location: [facility] ORS;  Service: Orthopedics;  Laterality: Right;    OB History   No obstetric history on file.      Home Medications    Prior to Admission medications   Medication Sig Start Date End Date Taking? Authorizing Provider  acetaminophen (TYLENOL) 500 MG tablet Take 2 tablets (1,000 mg total) by mouth every 6 (six) hours. 07/07/22   Cassandria Anger, PA-C  ALPRAZolam Prudy Feeler) 1 MG tablet TAKE 1/2 TO 1 TABLET BY MOUTH twice daily AS NEEDED FOR ANXIETY 09/13/23   Avelina Laine A, NP  amLODipine (NORVASC) 10 MG tablet Take 1 tablet (10 mg total) by  mouth daily. 05/10/23   Shade Flood, MD  aspirin EC 81 MG tablet Take 1 tablet (81 mg total) by mouth daily. Swallow whole. 10/27/23   [name], MD  atenolol (TENORMIN) 25 MG tablet Take 1 tablet (25 mg total) by mouth daily. 11/15/23 02/13/24  Reather Littler D, NP  atorvastatin (LIPITOR) 80 MG tablet Take 1 tablet (80 mg total) by mouth daily. 10/18/23   [name], MD  azelastine (ASTELIN) 0.1 % nasal spray Place 1-2 sprays into both nostrils 2 (two) times daily. Use in each nostril as directed 02/09/22   Shade Flood, MD  dextroamphetamine (DEXTROSTAT) 10 MG tablet Take 1 tablet (10 mg total) by mouth every morning. 09/21/23 10/21/23  Joan Flores, NP  diclofenac (VOLTAREN) 75 MG EC tablet 1 tablet twice a day by oral route.    [provider]  docusate sodium (COLACE) 100 MG capsule Take 1 capsule (100 mg total) by mouth 2 (two) times daily. 08/04/21   Cassandria Anger, PA-C  hydrOXYzine (ATARAX) 25 MG tablet TAKE ONE TABLET BY MOUTH every EIGHT hours AS NEEDED 10/24/22   Joan Flores, NP  lamoTRIgine (LAMICTAL) 100 MG tablet TAKE TWO TABLETS BY MOUTH EVERY MORNING and TAKE ONE TABLET BY MOUTH EVERY EVENING 09/13/23   Joan Flores, NP  levothyroxine (SYNTHROID) 100 MCG tablet TAKE ONE TABLET BY MOUTH BEFORE breakfast 06/12/23   Shade Flood, MD  losartan (COZAAR) 50 MG tablet Take 1 tablet (50 mg total) by mouth daily. 06/26/23   Shade Flood, MD  Na Sulfate-K Sulfate-Mg Sulf (SUPREP BOWEL PREP KIT) 17.5-3.13-1.6 GM/177ML SOLN Take 1 kit by mouth as directed. For colonoscopy prep Patient not taking: Reported on 11/15/2023 09/18/23   May, Deanna J, NP  nitroGLYCERIN (NITROSTAT) 0.4 MG SL tablet Place 1 tablet (0.4 mg total) under the tongue every 5 (five) minutes as needed for chest pain. 11/15/23 02/13/24  Reather Littler D, NP  omeprazole (PRILOSEC) 40 MG capsule Take 1 capsule (40 mg total) by mouth 2 (two) times daily. 09/18/23   May, Deanna J, NP   oxyCODONE-acetaminophen (PERCOCET) 10-325 MG tablet Take 1 tablet by mouth every 4 (four) hours as needed for pain.    [provider]  polyethylene glycol (MIRALAX / GLYCOLAX) 17 g packet Take 17 g by mouth daily as needed for mild constipation. 08/04/21   Cassandria Anger, PA-C  QUEtiapine (SEROQUEL) 50 MG tablet Take 1 tablet (50 mg total) by mouth daily. 09/13/23   Joan Flores, NP  traZODone (DESYREL) 150 MG tablet TAKE TWO TABLETS BY MOUTH everyday AT bedtime 09/13/23   Joan Flores, NP  Vitamin D, Ergocalciferol, (DRISDOL) 1.25 MG (50000 UNIT) CAPS capsule Take 1 capsule (50,000 Units total) by mouth every 7 (seven) days. Sunday 05/10/23  Shade Flood, MD    Family History Family History  Problem Relation Age of Onset   Diabetes Mother    Heart disease Mother    Hyperlipidemia Mother    Hypertension Mother    Stroke Mother    Hypertension Father    Parkinson's disease Father    Heart disease Father    Hyperlipidemia Father    Hemachromatosis Daughter    Thyroid disease Neg Hx    Colon cancer Neg Hx    Esophageal cancer Neg Hx    Inflammatory bowel disease Neg Hx    Liver disease Neg Hx    Pancreatic cancer Neg Hx    Rectal cancer Neg Hx    Stomach cancer Neg Hx    Sleep apnea Neg Hx     Social History Social History   Tobacco Use   Smoking status: Former    Current packs/day: 0.00    Average packs/day: 1 pack/day for 15.0 years (15.0 ttl pk-yrs)    Types: Cigarettes    Start date: 06/29/1964    Quit date: 06/30/1979    Years since quitting: 44.4   Smokeless tobacco: Never  Vaping Use   Vaping status: Never Used  Substance Use Topics   Alcohol use: Not Currently    Alcohol/week: 0.0 - 1.0 standard drinks of alcohol   Drug use: No     Allergies   Patient has no known allergies.   Review of Systems Review of Systems  Constitutional: Negative.   HENT: Negative.    Respiratory: Negative.    Cardiovascular: Negative.   Gastrointestinal:  Negative.   Genitourinary:  Positive for dysuria, frequency and urgency.  Musculoskeletal: Negative.   Psychiatric/Behavioral: Negative.       Physical Exam Triage Vital Signs ED Triage Vitals  Encounter Vitals Group     BP 12/11/23 1353 126/83     Systolic BP Percentile --      Diastolic BP Percentile --      Pulse Rate 12/11/23 1353 81     Resp 12/11/23 1353 18     Temp 12/11/23 1353 98.5 F (36.9 C)     Temp Source 12/11/23 1353 Oral     SpO2 12/11/23 1353 96 %     Weight 12/11/23 1351 190 lb 4.1 oz (86.3 kg)     Height 12/11/23 1351 5' 4.5" (1.638 m)     Head Circumference --      Peak Flow --      Pain Score 12/11/23 1349 0     Pain Loc --      Pain Education --      Exclude from Growth Chart --    No data found.  Updated Vital Signs BP 126/83 (BP Location: Left Arm)   Pulse 81   Temp 98.5 F (36.9 C) (Oral)   Resp 18   Ht 5' 4.5" (1.638 m)   Wt 86.3 kg   SpO2 96%   BMI 32.15 kg/m   Visual Acuity Right Eye Distance:   Left Eye Distance:   Bilateral Distance:    Right Eye Near:   Left Eye Near:    Bilateral Near:     Physical Exam Constitutional:      Appearance: Normal appearance.  Cardiovascular:     Rate and Rhythm: Normal rate and regular rhythm.  Pulmonary:     Effort: Pulmonary effort is normal.     Breath sounds: Normal breath sounds.  Abdominal:     Palpations: Abdomen is soft.  Tenderness: There is abdominal tenderness in the suprapubic area. There is no guarding or rebound.  Skin:    General: Skin is warm.  Neurological:     General: No focal deficit present.     Mental Status: She is alert.  Psychiatric:        Mood and Affect: Mood normal.      UC Treatments / Results  Labs (all labs ordered are listed, but only abnormal results are displayed) Labs Reviewed  POCT URINALYSIS DIP (MANUAL ENTRY) - Abnormal; Notable for the following components:      Result Value   Clarity, UA cloudy (*)    Blood, UA moderate (*)     Protein Ur, POC =30 (*)    Leukocytes, UA Large (3+) (*)    All other components within normal limits  URINE CULTURE    EKG   Radiology No results found.  Procedures Procedures (including critical care time)  Medications Ordered in UC Medications - No data to display  Initial Impression / Assessment and Plan / UC Course  I have reviewed the triage vital signs and the nursing notes.  Pertinent labs & imaging results that were available during my care of the patient were reviewed by me and considered in my medical decision making (see chart for details).   Final Clinical Impressions(s) / UC Diagnoses   Final diagnoses:  Urinary tract infection without hematuria, site unspecified     Discharge Instructions      You were seen today for urinary tract infection symptoms.  I have sent your urine to the lab for further testing, but I am treating you for a UTI.  Please increase fluid intake to flush this out.  Follow up if not improving.     ED Prescriptions     Medication Sig Dispense Auth. Provider   phenazopyridine (PYRIDIUM) 200 MG tablet Take 1 tablet (200 mg total) by mouth 3 (three) times daily. 6 tablet Levon Penning, MD   sulfamethoxazole-trimethoprim (BACTRIM DS) 800-160 MG tablet Take 1 tablet by mouth 2 (two) times daily for 5 days. 10 tablet Jannifer Franklin, MD      PDMP not reviewed this encounter.   Jannifer Franklin, MD 12/11/23 [phone]

## 2023-12-11 NOTE — Discharge Instructions (Signed)
 You were seen today for urinary tract infection symptoms.  I have sent your urine to the lab for further testing, but I am treating you for a UTI.  Please increase fluid intake to flush this out.  Follow up if not improving.

## 2023-12-11 NOTE — ED Triage Notes (Signed)
"  For about 3 wks I started with urinary discomfort but now the last 5-6 days with urinary pain". No fever known.

## 2023-12-13 LAB — URINE CULTURE: Culture: 80000 — AB

## 2023-12-18 ENCOUNTER — Ambulatory Visit: Admitting: Cardiology

## 2023-12-19 ENCOUNTER — Other Ambulatory Visit: Payer: Self-pay | Admitting: Family Medicine

## 2023-12-19 DIAGNOSIS — E890 Postprocedural hypothyroidism: Secondary | ICD-10-CM

## 2023-12-19 DIAGNOSIS — I10 Essential (primary) hypertension: Secondary | ICD-10-CM

## 2023-12-25 ENCOUNTER — Encounter: Payer: Self-pay | Admitting: Cardiovascular Disease

## 2023-12-25 ENCOUNTER — Ambulatory Visit: Admitting: Behavioral Health

## 2023-12-25 ENCOUNTER — Encounter: Payer: Self-pay | Admitting: Behavioral Health

## 2023-12-25 ENCOUNTER — Ambulatory Visit: Attending: Cardiovascular Disease | Admitting: Cardiovascular Disease

## 2023-12-25 VITALS — BP 128/66 | HR 70 | Ht 64.0 in | Wt 194.4 lb

## 2023-12-25 DIAGNOSIS — R0989 Other specified symptoms and signs involving the circulatory and respiratory systems: Secondary | ICD-10-CM

## 2023-12-25 DIAGNOSIS — E782 Mixed hyperlipidemia: Secondary | ICD-10-CM

## 2023-12-25 DIAGNOSIS — F99 Mental disorder, not otherwise specified: Secondary | ICD-10-CM | POA: Diagnosis not present

## 2023-12-25 DIAGNOSIS — F411 Generalized anxiety disorder: Secondary | ICD-10-CM

## 2023-12-25 DIAGNOSIS — I10 Essential (primary) hypertension: Secondary | ICD-10-CM

## 2023-12-25 DIAGNOSIS — R931 Abnormal findings on diagnostic imaging of heart and coronary circulation: Secondary | ICD-10-CM | POA: Diagnosis not present

## 2023-12-25 DIAGNOSIS — I5189 Other ill-defined heart diseases: Secondary | ICD-10-CM | POA: Insufficient documentation

## 2023-12-25 DIAGNOSIS — R5383 Other fatigue: Secondary | ICD-10-CM

## 2023-12-25 DIAGNOSIS — F3130 Bipolar disorder, current episode depressed, mild or moderate severity, unspecified: Secondary | ICD-10-CM

## 2023-12-25 DIAGNOSIS — F5105 Insomnia due to other mental disorder: Secondary | ICD-10-CM | POA: Diagnosis not present

## 2023-12-25 DIAGNOSIS — F3162 Bipolar disorder, current episode mixed, moderate: Secondary | ICD-10-CM

## 2023-12-25 MED ORDER — ALPRAZOLAM 1 MG PO TABS: ORAL_TABLET | ORAL | 3 refills | Status: DC

## 2023-12-25 MED ORDER — QUETIAPINE FUMARATE 50 MG PO TABS
50.0000 mg | ORAL_TABLET | Freq: Every day | ORAL | 1 refills | Status: DC
Start: 2023-12-25 — End: 2024-03-13

## 2023-12-25 MED ORDER — TRAZODONE HCL 150 MG PO TABS: ORAL_TABLET | ORAL | 1 refills | Status: DC

## 2023-12-25 MED ORDER — DEXTROAMPHETAMINE SULFATE 10 MG PO TABS: 10.0000 mg | ORAL_TABLET | Freq: Every morning | ORAL | 0 refills | Status: DC

## 2023-12-25 NOTE — Patient Instructions (Signed)
 Medication Instructions:  Your physician recommends that you continue on your current medications as directed. Please refer to the Current Medication list given to you today.  *If you need a refill on your cardiac medications before your next appointment, please call your pharmacy*  Testing/Procedures: Your physician has requested that you have a carotid duplex. This test is an ultrasound of the carotid arteries in your neck. It looks at blood flow through these arteries that supply the brain with blood. Allow one hour for this exam. There are no restrictions or special instructions. This will take place at [address] [facility]. 4th Floor  Please note: We ask at that you not bring children with you during ultrasound (echo/ vascular) testing. Due to room size and safety concerns, children are not allowed in the ultrasound rooms during exams. Our front office staff cannot provide observation of children in our lobby area while testing is being conducted. An adult accompanying a patient to their appointment will only be allowed in the ultrasound room at the discretion of the ultrasound technician under special circumstances. We apologize for any inconvenience.   Follow-Up: At [facility], you and your health needs are our priority.  As part of our continuing mission to provide you with exceptional heart care, our providers are all part of one team.  This team includes your primary Cardiologist (physician) and Advanced Practice Providers or APPs (Physician Assistants and Nurse Practitioners) who all work together to provide you with the care you need, when you need it.  Your next appointment:   3 month(s)  Provider:   Katlyn West, NP        Then, Lauro Portal, MD will plan to see you again in 6 month(s).    We recommend signing up for the patient portal called "MyChart".  Sign up information is provided on this After Visit Summary.  MyChart is used to connect with patients for Virtual  Visits (Telemedicine).  Patients are able to view lab/test results, encounter notes, upcoming appointments, etc.  Non-urgent messages can be sent to your provider as well.   To learn more about what you can do with MyChart, go to [email or web address].   Other Instructions    Please report to Radiology at the [facility] Main Entrance 30 minutes early for your test.  [address] [place], [place] [address]                         OR   Please report to Radiology at [facility] Main Entrance, medical mall, 30 mins prior to your test.  [address]  [place], [place]  How to Prepare for Your Cardiac PET/CT Stress Test:  Nothing to eat or drink, except water , 3 hours prior to arrival time.  NO caffeine/decaffeinated products, or chocolate 12 hours prior to arrival. (Please note decaffeinated beverages (teas/coffees) still contain caffeine).  If you have caffeine within 12 hours prior, the test will need to be rescheduled.  Medication instructions: Do not take erectile dysfunction medications for 72 hours prior to test (sildenafil, tadalafil) Do not take nitrates (isosorbide mononitrate, Ranexa) the day before or day of test Do not take tamsulosin the day before or morning of test Hold theophylline containing medications for 12 hours. Hold Dipyridamole 48 hours prior to the test.  Diabetic Preparation: If able to eat breakfast prior to 3 hour fasting, you may take all medications, including your insulin. Do not worry if  you miss your breakfast dose of insulin - start at your next meal. If you do not eat prior to 3 hour fast-Hold all diabetes (oral and insulin) medications. Patients who wear a continuous glucose monitor MUST remove the device prior to scanning.  You may take your remaining medications with water .  NO perfume, cologne or lotion on chest or abdomen area. FEMALES - Please avoid wearing dresses to this appointment.  Total time  is 1 to 2 hours; you may want to bring reading material for the waiting time.  IF YOU THINK YOU MAY BE PREGNANT, OR ARE NURSING PLEASE INFORM THE TECHNOLOGIST.  In preparation for your appointment, medication and supplies will be purchased.  Appointment availability is limited, so if you need to cancel or reschedule, please call the Radiology Department Scheduler at [phone] 24 hours in advance to avoid a cancellation fee of $100.00  What to Expect When you Arrive:  Once you arrive and check in for your appointment, you will be taken to a preparation room within the Radiology Department.  A technologist or Nurse will obtain your medical history, verify that you are correctly prepped for the exam, and explain the procedure.  Afterwards, an IV will be started in your arm and electrodes will be placed on your skin for EKG monitoring during the stress portion of the exam. Then you will be escorted to the PET/CT scanner.  There, staff will get you positioned on the scanner and obtain a blood pressure and EKG.  During the exam, you will continue to be connected to the EKG and blood pressure machines.  A small, safe amount of a radioactive tracer will be injected in your IV to obtain a series of pictures of your heart along with an injection of a stress agent.    After your Exam:  It is recommended that you eat a meal and drink a caffeinated beverage to counter act any effects of the stress agent.  Drink plenty of fluids for the remainder of the day and urinate frequently for the first couple of hours after the exam.  Your doctor will inform you of your test results within 7-10 business days.  For more information and frequently asked questions, please visit our website: [email or web address]  For questions about your test or how to prepare for your test, please call: Cardiac Imaging Nurse Navigators Office: [phone]          1st Floor: - Lobby - Registration  - Pharmacy   - Lab - Cafe  2nd Floor: - PV Lab - Diagnostic Testing (echo, CT, nuclear med)  3rd Floor: - Vacant  4th Floor: - TCTS (cardiothoracic surgery) - AFib Clinic - Structural Heart Clinic - Vascular Surgery  - Vascular Ultrasound  5th Floor: - [facility] Cardiology (general and EP) - Clinical Pharmacy for coumadin, hypertension, lipid, weight-loss medications, and med management appointments    Valet parking services will be available as well.

## 2023-12-25 NOTE — Assessment & Plan Note (Signed)
 History of essential hypertension with blood pressure measured today at 128/66.  She is on amlodipine  and losartan .

## 2023-12-25 NOTE — Assessment & Plan Note (Signed)
 History of hyperlipidemia on statin therapy with lipid profile performed 05/12/2023 revealing a total cholesterol of 149, LDL of 73 and HDL of 50.  She is at goal for secondary prevention given her elevated coronary calcium  score

## 2023-12-25 NOTE — Assessment & Plan Note (Signed)
 2D echo performed 11/08/2023 revealed normal LV systolic function with grade 1 diastolic dysfunction.  There were no significant valvular abnormalities.

## 2023-12-25 NOTE — Assessment & Plan Note (Signed)
 Coronary calcium  score performed 10/13/2023 was 3084.  FFR analysis was unremarkable except for distal OM branch.  She does complain of some jaw pain which is nitrate responsive.  I am going to get a cardiac PET to further evaluate.

## 2023-12-25 NOTE — Progress Notes (Signed)
 12/25/2023 AARIONA Lawson   Sep 02, 1951  [id number]  Primary Physician Benjiman Bras, MD Primary Cardiologist: Avanell Leigh MD Bennye Bravo, [place]  HPI:  Becky Lawson is a 73 y.o. moderately overweight divorced Caucasian female mother of 2, grandmother of 4 grandchildren who is transferring her care from Dr. Amanda Jungling to myself.  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  She is also an Charity fundraiser apparently.  Her risk factors include treated hypertension hyperlipidemia.  There is no family history of heart disease.  She was having some chest pain and shortness of breath which led to a 2D echo that showed normal LV systolic function with grade 1 diastolic dysfunction and 11/08/2023 as well as a coronary CTA that showed a coronary calcium  score of 3084 with some tapering of her distal circumflex.  She no longer complains of chest pain but does get some jaw pain which is nitro responsive.   Current Meds  Medication Sig   acetaminophen  (TYLENOL ) 500 MG tablet Take 2 tablets (1,000 mg total) by mouth every 6 (six) hours.   ALPRAZolam  (XANAX ) 1 MG tablet TAKE 1/2 TO 1 TABLET BY MOUTH twice daily AS NEEDED FOR ANXIETY   amLODipine  (NORVASC ) 10 MG tablet Take 1 tablet (10 mg total) by mouth daily.   aspirin  EC 81 MG tablet Take 1 tablet (81 mg total) by mouth daily. Swallow whole.   atenolol  (TENORMIN ) 25 MG tablet Take 1 tablet (25 mg total) by mouth daily.   atorvastatin  (LIPITOR) 80 MG tablet Take 1 tablet (80 mg total) by mouth daily.   azelastine  (ASTELIN ) 0.1 % nasal spray Place 1-2 sprays into both nostrils 2 (two) times daily. Use in each nostril as directed   diclofenac (VOLTAREN) 75 MG EC tablet 1 tablet twice a day by oral route.   docusate sodium  (COLACE) 100 MG capsule Take 1 capsule (100 mg total) by mouth 2 (two) times daily.   hydrOXYzine  (ATARAX ) 25 MG tablet TAKE ONE TABLET BY MOUTH every EIGHT hours AS NEEDED   lamoTRIgine  (LAMICTAL ) 100 MG tablet TAKE TWO TABLETS  BY MOUTH EVERY MORNING and TAKE ONE TABLET BY MOUTH EVERY EVENING   levothyroxine  (SYNTHROID ) 100 MCG tablet TAKE 1 TABLET BY MOUTH DAILY BEFORE BREAKFAST   losartan  (COZAAR ) 50 MG tablet TAKE 1 TABLET BY MOUTH DAILY   nitroGLYCERIN  (NITROSTAT ) 0.4 MG SL tablet Place 1 tablet (0.4 mg total) under the tongue every 5 (five) minutes as needed for chest pain.   omeprazole  (PRILOSEC) 40 MG capsule Take 1 capsule (40 mg total) by mouth 2 (two) times daily.   oxyCODONE -acetaminophen  (PERCOCET) 10-325 MG tablet Take 1 tablet by mouth every 4 (four) hours as needed for pain.   polyethylene glycol (MIRALAX  / GLYCOLAX ) 17 g packet Take 17 g by mouth daily as needed for mild constipation.   QUEtiapine  (SEROQUEL ) 50 MG tablet Take 1 tablet (50 mg total) by mouth daily.   traZODone  (DESYREL ) 150 MG tablet TAKE TWO TABLETS BY MOUTH everyday AT bedtime   Vitamin D , Ergocalciferol , (DRISDOL ) 1.25 MG (50000 UNIT) CAPS capsule Take 1 capsule (50,000 Units total) by mouth every 7 (seven) days. Sunday   Current Facility-Administered Medications for the 12/25/23 encounter (Office Visit) with Avanell Leigh, MD  Medication   cyanocobalamin  ((VITAMIN B-12)) injection 1,000 mcg     No Known Allergies  Social History   Socioeconomic History   Marital status: Divorced    Spouse name: Not on  file   Number of children: Not on file   Years of education: Not on file   Highest education level: Associate degree: academic program  Occupational History   Occupation: Personal Caregiver  Tobacco Use   Smoking status: Former    Current packs/day: 0.00    Average packs/day: 1 pack/day for 15.0 years (15.0 ttl pk-yrs)    Types: Cigarettes    Start date: 06/29/1964    Quit date: 06/30/1979    Years since quitting: 44.5   Smokeless tobacco: Never  Vaping Use   Vaping status: Never Used  Substance and Sexual Activity   Alcohol use: Not Currently    Alcohol/week: 0.0 - 1.0 standard drinks of alcohol   Drug use: No    Sexual activity: Yes    Birth control/protection: None  Other Topics Concern   Not on file  Social History Narrative   Divorced. Education: [employer]. Exercise: No.   Left handed   Lives alone   Caffeine: 1/4-1/2 cup/day   Social Drivers of Corporate investment banker Strain: Low Risk  (12/21/2022)   Overall Financial Resource Strain (CARDIA)    Difficulty of Paying Living Expenses: Not hard at all  Recent Concern: Financial Resource Strain - Medium Risk (12/13/2022)   Overall Financial Resource Strain (CARDIA)    Difficulty of Paying Living Expenses: Somewhat hard  Food Insecurity: No Food Insecurity (12/21/2022)   Hunger Vital Sign    Worried About Running Out of Food in the Last Year: Never true    Ran Out of Food in the Last Year: Never true  Transportation Needs: No Transportation Needs (12/21/2022)   PRAPARE - Administrator, Civil Service (Medical): No    Lack of Transportation (Non-Medical): No  Physical Activity: Insufficiently Active (12/21/2022)   Exercise Vital Sign    Days of Exercise per Week: 2 days    Minutes of Exercise per Session: 30 min  Stress: No Stress Concern Present (12/21/2022)   [employer] of Occupational Health - Occupational Stress Questionnaire    Feeling of Stress : Not at all  Social Connections: Moderately Integrated (12/21/2022)   Social Connection and Isolation Panel [NHANES]    Frequency of Communication with Friends and Family: More than three times a week    Frequency of Social Gatherings with Friends and Family: More than three times a week    Attends Religious Services: More than 4 times per year    Active Member of [employer] or Organizations: Yes    Attends Engineer, structural: More than 4 times per year    Marital Status: Divorced  Intimate Partner Violence: Not At Risk (12/21/2022)   Humiliation, Afraid, Rape, and Kick questionnaire    Fear of Current or Ex-Partner: No    Emotionally Abused: No    Physically  Abused: No    Sexually Abused: No     Review of Systems: General: negative for chills, fever, night sweats or weight changes.  Cardiovascular: negative for chest pain, dyspnea on exertion, edema, orthopnea, palpitations, paroxysmal nocturnal dyspnea or shortness of breath Dermatological: negative for rash Respiratory: negative for cough or wheezing Urologic: negative for hematuria Abdominal: negative for nausea, vomiting, diarrhea, bright red blood per rectum, melena, or hematemesis Neurologic: negative for visual changes, syncope, or dizziness All other systems reviewed and are otherwise negative except as noted above.    Blood pressure 128/66, pulse 70, height 5\' 4"  (1.626 m), weight 194 lb 6.4 oz (88.2 kg), SpO2 97%.  General  appearance: alert and no distress Neck: no adenopathy, no JVD, supple, symmetrical, trachea midline, thyroid  not enlarged, symmetric, no tenderness/mass/nodules, and right carotid bruit Lungs: clear to auscultation bilaterally Heart: Soft outflow tract murmur consistent with aortic stenosis and/or sclerosis. Extremities: extremities normal, atraumatic, no cyanosis or edema Pulses: 2+ and symmetric Skin: Skin color, texture, turgor normal. No rashes or lesions Neurologic: Grossly normal  EKG not performed today      ASSESSMENT AND PLAN:   Essential hypertension History of essential hypertension with blood pressure measured today at 128/66.  She is on amlodipine  and losartan .  Hyperlipidemia History of hyperlipidemia on statin therapy with lipid profile performed 05/12/2023 revealing a total cholesterol of 149, LDL of 73 and HDL of 50.  She is at goal for secondary prevention given her elevated coronary calcium  score  Elevated coronary artery calcium  score Coronary calcium  score performed 10/13/2023 was 3084.  FFR analysis was unremarkable except for distal OM branch.  She does complain of some jaw pain which is nitrate responsive.  I am going to get a cardiac  PET to further evaluate.  Diastolic dysfunction 2D echo performed 11/08/2023 revealed normal LV systolic function with grade 1 diastolic dysfunction.  There were no significant valvular abnormalities.     Avanell Leigh MD FACP,FACC,FAHA, [facility] 12/25/2023 10:50 AM

## 2023-12-25 NOTE — Progress Notes (Signed)
 [facility]  Patient ID: Becky Lawson,  MRN: [id number]  PCP: Benjiman Bras, MD  Date of Evaluation: 12/25/2023 Time spent:30 minutes  Chief Complaint:  Chief Complaint   Depression; Anxiety; Follow-up; Medication Refill; Patient Education     HISTORY/CURRENT STATUS: HPI  Becky Lawson presents for follow-up and medication management. No manic episodes since last visit but wonders whether mild depression is present. She is working with her cardiologist and in the process of scheduling more diagnostics. Says she was told she has bruit in right carotid artery and possible blockage. She wants to continue taking her Dextrostat  but wants to follow up with cardiology to make sure it is cleared for use.   Fine tremors in both hand were improved today, not noticeable today. For now she would like to continue with current medication regimen. She believes worrying about her physical issues has exacerbated some mild depression.  Says her anxiety today is 3/10 and depression is 3/10. She was sleeping  7 hours per night with aid of medication. She denies mania, no psychosis, NO SI/HI.   Past medications for mental health diagnoses include: Lithium , Seroquel , Effexor, Prozac, Latuda , Abilify, Dexedrine , Cymbalta  caused weight gain.  Modafinil .       Individual Medical History/ Review of Systems: Changes? :No   Allergies: Patient has no known allergies.  Current Medications:  Current Outpatient Medications:    acetaminophen  (TYLENOL ) 500 MG tablet, Take 2 tablets (1,000 mg total) by mouth every 6 (six) hours., Disp: 30 tablet, Rfl: 0   ALPRAZolam  (XANAX ) 1 MG tablet, TAKE 1/2 TO 1 TABLET BY MOUTH twice daily AS NEEDED FOR ANXIETY, Disp: 45 tablet, Rfl: 3   amLODipine  (NORVASC ) 10 MG tablet, Take 1 tablet (10 mg total) by mouth daily., Disp: 90 tablet, Rfl: 1   aspirin  EC 81 MG tablet, Take 1 tablet (81 mg total) by mouth daily. Swallow whole., Disp: 30 tablet, Rfl: 12   atenolol   (TENORMIN ) 25 MG tablet, Take 1 tablet (25 mg total) by mouth daily., Disp: 90 tablet, Rfl: 3   atorvastatin  (LIPITOR) 80 MG tablet, Take 1 tablet (80 mg total) by mouth daily., Disp: 90 tablet, Rfl: 3   azelastine  (ASTELIN ) 0.1 % nasal spray, Place 1-2 sprays into both nostrils 2 (two) times daily. Use in each nostril as directed, Disp: 30 mL, Rfl: 3   dextroamphetamine  (DEXTROSTAT ) 10 MG tablet, Take 1 tablet (10 mg total) by mouth every morning., Disp: 30 tablet, Rfl: 0   diclofenac (VOLTAREN) 75 MG EC tablet, 1 tablet twice a day by oral route., Disp: , Rfl:    docusate sodium  (COLACE) 100 MG capsule, Take 1 capsule (100 mg total) by mouth 2 (two) times daily., Disp: 10 capsule, Rfl: 0   hydrOXYzine  (ATARAX ) 25 MG tablet, TAKE ONE TABLET BY MOUTH every EIGHT hours AS NEEDED, Disp: 60 tablet, Rfl: 5   lamoTRIgine  (LAMICTAL ) 100 MG tablet, TAKE TWO TABLETS BY MOUTH EVERY MORNING and TAKE ONE TABLET BY MOUTH EVERY EVENING, Disp: 270 tablet, Rfl: 1   levothyroxine  (SYNTHROID ) 100 MCG tablet, TAKE 1 TABLET BY MOUTH DAILY BEFORE BREAKFAST, Disp: 90 tablet, Rfl: 1   losartan  (COZAAR ) 50 MG tablet, TAKE 1 TABLET BY MOUTH DAILY, Disp: 90 tablet, Rfl: 1   Na Sulfate-K Sulfate-Mg Sulf (SUPREP BOWEL PREP KIT) 17.5-3.13-1.6 GM/177ML SOLN, Take 1 kit by mouth as directed. For colonoscopy prep (Patient not taking: Reported on 12/25/2023), Disp: 354 mL, Rfl: 0   nitroGLYCERIN  (NITROSTAT ) 0.4 MG SL tablet,  Place 1 tablet (0.4 mg total) under the tongue every 5 (five) minutes as needed for chest pain., Disp: 25 tablet, Rfl: 2   omeprazole  (PRILOSEC) 40 MG capsule, Take 1 capsule (40 mg total) by mouth 2 (two) times daily., Disp: 60 capsule, Rfl: 2   oxyCODONE -acetaminophen  (PERCOCET) 10-325 MG tablet, Take 1 tablet by mouth every 4 (four) hours as needed for pain., Disp: , Rfl:    polyethylene glycol (MIRALAX  / GLYCOLAX ) 17 g packet, Take 17 g by mouth daily as needed for mild constipation., Disp: 14 each, Rfl: 0    QUEtiapine  (SEROQUEL ) 50 MG tablet, Take 1 tablet (50 mg total) by mouth daily., Disp: 90 tablet, Rfl: 1   traZODone  (DESYREL ) 150 MG tablet, TAKE TWO TABLETS BY MOUTH everyday AT bedtime, Disp: 180 tablet, Rfl: 1   Vitamin D , Ergocalciferol , (DRISDOL ) 1.25 MG (50000 UNIT) CAPS capsule, Take 1 capsule (50,000 Units total) by mouth every 7 (seven) days. Sunday, Disp: 12 capsule, Rfl: 3  Current Facility-Administered Medications:    cyanocobalamin  ((VITAMIN B-12)) injection 1,000 mcg, 1,000 mcg, Intramuscular, Q30 days, [place] Dk, MD, 1,000 mcg at 07/12/18 1652 Medication Side Effects: none  Family Medical/ Social History: Changes? No  MENTAL HEALTH EXAM:  There were no vitals taken for this visit.There is no height or weight on file to calculate BMI.  General Appearance: Casual and Neat  Eye Contact:  NA  Speech:  Clear and Coherent  Volume:  Normal  Mood:  NA  Affect:  Appropriate  Thought Process:  Coherent  Orientation:  Full (Time, Place, and Person)  Thought Content: Logical   Suicidal Thoughts:  No  Homicidal Thoughts:  No  Memory:  WNL  Judgement:  Good  Insight:  Good  Psychomotor Activity:  Normal  Concentration:  Concentration: Good  Recall:  Good  Fund of Knowledge: Good  Language: Good  Assets:  Desire for Improvement  ADL's:  Intact  Cognition: WNL  Prognosis:  Good    DIAGNOSES:    ICD-10-CM   1. Bipolar I disorder, most recent episode depressed (HCC)  F31.30     2. Generalized anxiety disorder  F41.1 ALPRAZolam  (XANAX ) 1 MG tablet    traZODone  (DESYREL ) 150 MG tablet    QUEtiapine  (SEROQUEL ) 50 MG tablet    3. Insomnia due to other mental disorder  F51.05 traZODone  (DESYREL ) 150 MG tablet   F99 QUEtiapine  (SEROQUEL ) 50 MG tablet    4. Fatigue, unspecified type  R53.83 dextroamphetamine  (DEXTROSTAT ) 10 MG tablet    5. Bipolar 1 disorder, mixed, moderate (HCC)  F31.62 QUEtiapine  (SEROQUEL ) 50 MG tablet      Receiving Psychotherapy: No     RECOMMENDATIONS:   Greater than 50% of 30 min of face to face time  was spent on counseling and coordination of care. We discussed her current moderate improvement with medication. She request no medication changes this visit. She is still working out at [employer]. Will be following up with Cardiology.   -Conducted AIMS  Score of 3 today.  12/25/2023 Continue Seroquel  50 mg daily at bedtime Continue hydroxyzine  25 mg nightly 8H as needed. Continue Lamictal   300 mg daily. 200 mg in the am and 100 mg in the evening. Continue the  trazodone  300 mg, 1.5 pills nightly. Continue Mirtazapine  7.5 mg at bedtime. May increase to 15 mg if needed.  Continue Xanax  1 mg at bedtime but if taking Return in 3 months to reassess.  Discussed potential benefits, risks, and side effects of stimulants  with patient to include increased heart rate, palpitations, insomnia, increased anxiety, increased irritability, or decreased appetite.  Instructed patient to contact office if experiencing any significant tolerability issues.  Discussed potential metabolic side effects associated with atypical antipsychotics, as well as potential risk for movement side effects. Advised pt to contact office if movement side effects occur.   PDMP was reviewed    Lincoln Renshaw, NP

## 2024-01-01 ENCOUNTER — Other Ambulatory Visit: Payer: Self-pay | Admitting: Ophthalmology

## 2024-01-01 DIAGNOSIS — D23112 Other benign neoplasm of skin of right lower eyelid, including canthus: Secondary | ICD-10-CM | POA: Diagnosis not present

## 2024-01-05 LAB — DERMATOLOGY PATHOLOGY

## 2024-01-09 ENCOUNTER — Encounter

## 2024-01-10 ENCOUNTER — Telehealth: Payer: Self-pay | Admitting: Cardiovascular Disease

## 2024-01-10 NOTE — Telephone Encounter (Signed)
 Patient wants to get dietary information regarding her heart health and potassium.

## 2024-01-10 NOTE — Telephone Encounter (Signed)
 Spoke to patient at length about potassium level and high-potassium foods. Verified with her the date of her upcoming PET scan, No further questions.

## 2024-01-18 ENCOUNTER — Ambulatory Visit: Payer: Self-pay | Admitting: Cardiovascular Disease

## 2024-01-18 ENCOUNTER — Ambulatory Visit (HOSPITAL_COMMUNITY)
Admission: RE | Admit: 2024-01-18 | Discharge: 2024-01-18 | Disposition: A | Discharge status: Home / Self Care | Source: Ambulatory Visit | Attending: Cardiovascular Disease | Admitting: Cardiovascular Disease

## 2024-01-18 ENCOUNTER — Ambulatory Visit: Admission: EM | Admit: 2024-01-18 | Discharge: 2024-01-18 | Disposition: A | Discharge status: Home / Self Care

## 2024-01-18 DIAGNOSIS — E782 Mixed hyperlipidemia: Secondary | ICD-10-CM | POA: Diagnosis not present

## 2024-01-18 DIAGNOSIS — S40862A Insect bite (nonvenomous) of left upper arm, initial encounter: Secondary | ICD-10-CM

## 2024-01-18 DIAGNOSIS — W57XXXA Bitten or stung by nonvenomous insect and other nonvenomous arthropods, initial encounter: Secondary | ICD-10-CM

## 2024-01-18 DIAGNOSIS — R0989 Other specified symptoms and signs involving the circulatory and respiratory systems: Secondary | ICD-10-CM | POA: Diagnosis not present

## 2024-01-18 DIAGNOSIS — R931 Abnormal findings on diagnostic imaging of heart and coronary circulation: Secondary | ICD-10-CM | POA: Diagnosis not present

## 2024-01-18 MED ORDER — DOXYCYCLINE HYCLATE 100 MG PO CAPS: 200.0000 mg | ORAL_CAPSULE | Freq: Once | ORAL | 0 refills | Status: AC

## 2024-01-18 NOTE — Discharge Instructions (Addendum)
  1. Tick bite of left upper arm, initial encounter (Primary) - doxycycline  (VIBRAMYCIN ) 100 MG capsule; Take 2 capsules (200 mg total) by mouth once for 1 dose.  Dispense: 2 capsule; Refill: 0 - Follow-up with primary care physician for further evaluation if fatigue symptoms do not improve after antibiotic therapy to evaluate for other potential causes.

## 2024-01-18 NOTE — ED Triage Notes (Signed)
"  I have had 3 tick bites and I am now absolutely tired/sluggish and sleepy non stop". Last tick bite "about 2 wks ago". "The sites were the ticks were removed may be red/inflamed, ? All of the tick removed on the upper outside of left arm". No fever known.

## 2024-01-18 NOTE — ED Provider Notes (Signed)
 [facility]  Note:  This document was prepared using Conservation officer, historic buildings and may include unintentional dictation errors.  MRN: [id number] DOB: [birth year]  Subjective:   Becky Lawson is a 73 y.o. female presenting for 3 separate tick bites 1 to the posterior left upper arm, 1 to the right arm and 1 to right hand.  Patient reports that she does not know how long the ticks were attached, reports mild erythema and swelling.  No pain or itching.  Patient reports that last tick was removed approximately 2 weeks ago but notes that now she feels extremely tired and fatigued every day.  Patient was concern for possible Lyme disease.  Patient denies any fever or erythema migrans secondary to tick removal.   Current Facility-Administered Medications:    cyanocobalamin  ((VITAMIN B-12)) injection 1,000 mcg, 1,000 mcg, Intramuscular, Q30 days, [place] Dk, MD, 1,000 mcg at 07/12/18 1652  Current Outpatient Medications:    cholestyramine (QUESTRAN) 4 GM/DOSE powder, 4 g orally 2 times a day for 30 day(s), Disp: , Rfl:    doxycycline  (VIBRAMYCIN ) 100 MG capsule, Take 2 capsules (200 mg total) by mouth once for 1 dose., Disp: 2 capsule, Rfl: 0   acetaminophen  (TYLENOL ) 500 MG tablet, Take 2 tablets (1,000 mg total) by mouth every 6 (six) hours., Disp: 30 tablet, Rfl: 0   ALPRAZolam  (XANAX ) 1 MG tablet, TAKE 1/2 TO 1 TABLET BY MOUTH twice daily AS NEEDED FOR ANXIETY, Disp: 45 tablet, Rfl: 3   amLODipine  (NORVASC ) 10 MG tablet, Take 1 tablet (10 mg total) by mouth daily., Disp: 90 tablet, Rfl: 1   aspirin  EC 81 MG tablet, Take 1 tablet (81 mg total) by mouth daily. Swallow whole., Disp: 30 tablet, Rfl: 12   atenolol  (TENORMIN ) 25 MG tablet, Take 1 tablet (25 mg total) by mouth daily., Disp: 90 tablet, Rfl: 3   atorvastatin  (LIPITOR) 80 MG tablet, Take 1 tablet (80 mg total) by mouth daily., Disp: 90 tablet, Rfl: 3   azelastine  (ASTELIN ) 0.1 % nasal spray, Place 1-2 sprays into both  nostrils 2 (two) times daily. Use in each nostril as directed, Disp: 30 mL, Rfl: 3   dextroamphetamine  (DEXTROSTAT ) 10 MG tablet, Take 1 tablet (10 mg total) by mouth every morning., Disp: 30 tablet, Rfl: 0   diclofenac (VOLTAREN) 75 MG EC tablet, 1 tablet twice a day by oral route., Disp: , Rfl:    docusate sodium  (COLACE) 100 MG capsule, Take 1 capsule (100 mg total) by mouth 2 (two) times daily., Disp: 10 capsule, Rfl: 0   hydrOXYzine  (ATARAX ) 25 MG tablet, TAKE ONE TABLET BY MOUTH every EIGHT hours AS NEEDED, Disp: 60 tablet, Rfl: 5   lamoTRIgine  (LAMICTAL ) 100 MG tablet, TAKE TWO TABLETS BY MOUTH EVERY MORNING and TAKE ONE TABLET BY MOUTH EVERY EVENING, Disp: 270 tablet, Rfl: 1   levothyroxine  (SYNTHROID ) 100 MCG tablet, TAKE 1 TABLET BY MOUTH DAILY BEFORE BREAKFAST, Disp: 90 tablet, Rfl: 1   losartan  (COZAAR ) 50 MG tablet, TAKE 1 TABLET BY MOUTH DAILY, Disp: 90 tablet, Rfl: 1   Na Sulfate-K Sulfate-Mg Sulf (SUPREP BOWEL PREP KIT) 17.5-3.13-1.6 GM/177ML SOLN, Take 1 kit by mouth as directed. For colonoscopy prep (Patient not taking: Reported on 12/25/2023), Disp: 354 mL, Rfl: 0   nitroGLYCERIN  (NITROSTAT ) 0.4 MG SL tablet, Place 1 tablet (0.4 mg total) under the tongue every 5 (five) minutes as needed for chest pain., Disp: 25 tablet, Rfl: 2   omeprazole  (PRILOSEC) 40 MG capsule, Take 1  capsule (40 mg total) by mouth 2 (two) times daily., Disp: 60 capsule, Rfl: 2   oxyCODONE -acetaminophen  (PERCOCET) 10-325 MG tablet, Take 1 tablet by mouth every 4 (four) hours as needed for pain., Disp: , Rfl:    polyethylene glycol (MIRALAX  / GLYCOLAX ) 17 g packet, Take 17 g by mouth daily as needed for mild constipation., Disp: 14 each, Rfl: 0   QUEtiapine  (SEROQUEL ) 50 MG tablet, Take 1 tablet (50 mg total) by mouth daily., Disp: 90 tablet, Rfl: 1   traZODone  (DESYREL ) 150 MG tablet, TAKE TWO TABLETS BY MOUTH everyday AT bedtime, Disp: 180 tablet, Rfl: 1   Vitamin D , Ergocalciferol , (DRISDOL ) 1.25 MG (50000  UNIT) CAPS capsule, Take 1 capsule (50,000 Units total) by mouth every 7 (seven) days. Sunday, Disp: 12 capsule, Rfl: 3   No Known Allergies  Past Medical History:  Diagnosis Date   Arthritis    Benign cyst of right kidney 2014   2.7 cm right kidney cyst seen on imaging 2014 but was c/o right flank pain with new persistent proteinuria (fortunately hematuria had resolved) so repeat US  11/2016 showed right kidney still with 2.6 cm simple cyst and otherwise nml.   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, genotype 1b, followed at [facility]   Hyperlipidemia    Hypertension    Hypothyroidism    Jaundice 11/01/2012   Pill dysphagia 10/20/2021   Pruritic disorder 02/13/2013   Sleep apnea    Was diagnosed approximately 20 years ago, but does not wear CPAP patient stated "I gave it back.Aaron AasAaron AasI couldn't wear that thing it was horrible"   Spinal stenosis    Thyroid  disease    [employer] 06/15/2020   Unintentional weight loss of more than 10 pounds 07/12/2018     Past Surgical History:  Procedure Laterality Date   ABDOMINAL HYSTERECTOMY     COLONOSCOPY W/ POLYPECTOMY     JOINT REPLACEMENT Bilateral    knee   KNEE ARTHROSCOPY Bilateral    PARTIAL HYSTERECTOMY     THYROID  LOBECTOMY Left 06/16/2016   THYROID  LOBECTOMY Left 06/16/2016   Procedure: LEFT THYROID  LOBECTOMY;  Surgeon: Aldean Hummingbird, MD;  Location: [facility] OR;  Service: General;  Laterality: Left;   TOTAL HIP ARTHROPLASTY Left 08/03/2021   Procedure: TOTAL HIP ARTHROPLASTY ANTERIOR APPROACH;  Surgeon: Claiborne Crew, MD;  Location: [facility] ORS;  Service: Orthopedics;  Laterality: Left;   TOTAL HIP ARTHROPLASTY Right 07/05/2022   Procedure: TOTAL HIP ARTHROPLASTY ANTERIOR APPROACH;  Surgeon: Claiborne Crew, MD;  Location: [facility];   Service: Orthopedics;  Laterality: Right;    Family History  Problem Relation Age of Onset   Diabetes Mother    Heart disease Mother    Hyperlipidemia Mother    Hypertension Mother    Stroke Mother    Hypertension Father    Parkinson's disease Father    Heart disease Father    Hyperlipidemia Father    Hemachromatosis Daughter    Thyroid  disease Neg Hx    Colon cancer Neg Hx    Esophageal cancer Neg Hx    Inflammatory bowel disease Neg Hx    Liver disease Neg Hx    Pancreatic cancer Neg Hx    Rectal cancer Neg Hx  Stomach cancer Neg Hx    Sleep apnea Neg Hx     Social History   Tobacco Use   Smoking status: Former    Current packs/day: 0.00    Average packs/day: 1 pack/day for 15.0 years (15.0 ttl pk-yrs)    Types: Cigarettes    Start date: 06/29/1964    Quit date: 06/30/1979    Years since quitting: 44.5   Smokeless tobacco: Never  Vaping Use   Vaping status: Never Used  Substance Use Topics   Alcohol use: Not Currently    Alcohol/week: 0.0 - 1.0 standard drinks of alcohol   Drug use: Never    ROS Refer to HPI for ROS details.  Objective:   Vitals: BP 114/70 (BP Location: Left Arm)   Pulse 68   Temp 98.9 F (37.2 C) (Oral)   Resp 20   Ht 5\' 5"  (1.651 m)   Wt 194 lb (88 kg)   SpO2 96%   BMI 32.28 kg/m   Physical Exam Vitals and nursing note reviewed.  Constitutional:      General: She is not in acute distress.    Appearance: Normal appearance. She is not ill-appearing.  HENT:     Head: Normocephalic.  Cardiovascular:     Rate and Rhythm: Normal rate.  Pulmonary:     Effort: Pulmonary effort is normal. No respiratory distress.  Skin:    General: Skin is warm and dry.     Capillary Refill: Capillary refill takes less than 2 seconds.     Findings: Erythema and wound (3 small erythematous, scabbed wounds noted 1 to the left posterior upper arm, 1 bite to the right upper arm, 1 to the right hand, no surrounding erythema or induration, no  drainage, no erythema migrans.) present.  Neurological:     General: No focal deficit present.     Mental Status: She is alert and oriented to person, place, and time.  Psychiatric:        Mood and Affect: Mood normal.        Behavior: Behavior normal.     Procedures  No results found for this or any previous visit (from the past 24 hours).  VAS US  CAROTID Result Date: 01/18/2024 Carotid Arterial Duplex Study Patient Name:  KEELAH GOLDFIELD  Date of Exam:   01/18/2024 Medical Rec #: [id number]     Accession #:    [id number] Date of Birth: [birth year]     Patient Gender: F Patient Age:   1 years Exam Location:  [address] Procedure:      VAS US  CAROTID Referring Phys: Lauro Portal --------------------------------------------------------------------------------  Indications:       Right bruit. Risk Factors:      Hypertension, hyperlipidemia, past history of smoking,                    coronary artery disease. Comparison Study:  None Performing Technologist: [employer] BS, RVT, RDCS  Examination Guidelines: A complete evaluation includes B-mode imaging, spectral Doppler, color Doppler, and power Doppler as needed of all accessible portions of each vessel. Bilateral testing is considered an integral part of a complete examination. Limited examinations for reoccurring indications may be performed as noted.  Right Carotid Findings: +----------+--------+--------+--------+------------------+--------+           PSV cm/sEDV cm/sStenosisPlaque DescriptionComments +----------+--------+--------+--------+------------------+--------+ CCA Prox  122     10                                         +----------+--------+--------+--------+------------------+--------+  CCA Distal81      16                                         +----------+--------+--------+--------+------------------+--------+ ICA Prox  70      10              smooth                      +----------+--------+--------+--------+------------------+--------+ ICA Mid   82      20                                         +----------+--------+--------+--------+------------------+--------+ ICA Distal63      17                                         +----------+--------+--------+--------+------------------+--------+ ECA       124     8                                          +----------+--------+--------+--------+------------------+--------+ +----------+--------+-------+---------+-------------------+           PSV cm/sEDV cmsDescribe Arm Pressure (mmHG) +----------+--------+-------+---------+-------------------+ Subclavian178     0      Turbulent128                 +----------+--------+-------+---------+-------------------+ +---------+--------+--+--------+--+---------+ VertebralPSV cm/s48EDV cm/s10Antegrade +---------+--------+--+--------+--+---------+  Left Carotid Findings: +----------+--------+--------+--------+------------------+---------+           PSV cm/sEDV cm/sStenosisPlaque DescriptionComments  +----------+--------+--------+--------+------------------+---------+ CCA Prox  84      11                                          +----------+--------+--------+--------+------------------+---------+ CCA Distal73      9                                           +----------+--------+--------+--------+------------------+---------+ ICA Prox  52      12              heterogenous                +----------+--------+--------+--------+------------------+---------+ ICA Mid   174     27      1-39%                     Shadowing +----------+--------+--------+--------+------------------+---------+ ICA Distal98      10                                          +----------+--------+--------+--------+------------------+---------+ ECA       79      0                                            +----------+--------+--------+--------+------------------+---------+ +----------+--------+--------+----------------+-------------------+  PSV cm/sEDV cm/sDescribe        Arm Pressure (mmHG) +----------+--------+--------+----------------+-------------------+ Subclavian112     0       Multiphasic, [id number]                 +----------+--------+--------+----------------+-------------------+ +---------+--------+--+--------+---------+ VertebralPSV cm/s42EDV cm/sAntegrade +---------+--------+--+--------+---------+   Summary: Right Carotid: Velocities in the right ICA are consistent with a 1-39% stenosis. Left Carotid: Velocities in the left ICA are consistent with a 1-39% stenosis.               ICA stenosis high end of 1-39%. Vertebrals:  Bilateral vertebral arteries demonstrate antegrade flow. Subclavians: Right subclavian artery flow was disturbed. Normal flow              hemodynamics were seen in the left subclavian artery. *See table(s) above for measurements and observations. Suggest follow up study in 12 months.    Preliminary      Assessment and Plan :     Discharge Instructions       1. Tick bite of left upper arm, initial encounter (Primary) - doxycycline  (VIBRAMYCIN ) 100 MG capsule; Take 2 capsules (200 mg total) by mouth once for 1 dose.  Dispense: 2 capsule; Refill: 0 - Follow-up with primary care physician for further evaluation if fatigue symptoms do not improve after antibiotic therapy to evaluate for other potential causes.     Latrese Carolan B Breigh Annett   Jancarlos Thrun, [place] [address], [place] 01/18/24 1435

## 2024-01-23 ENCOUNTER — Encounter: Payer: Self-pay | Admitting: Emergency Medicine

## 2024-01-23 ENCOUNTER — Ambulatory Visit
Admission: EM | Admit: 2024-01-23 | Discharge: 2024-01-23 | Disposition: A | Discharge status: Home / Self Care | Attending: Family Medicine | Admitting: Family Medicine

## 2024-01-23 ENCOUNTER — Other Ambulatory Visit: Payer: Self-pay

## 2024-01-23 DIAGNOSIS — S40861D Insect bite (nonvenomous) of right upper arm, subsequent encounter: Secondary | ICD-10-CM

## 2024-01-23 DIAGNOSIS — R0600 Dyspnea, unspecified: Secondary | ICD-10-CM | POA: Diagnosis not present

## 2024-01-23 DIAGNOSIS — R5383 Other fatigue: Secondary | ICD-10-CM | POA: Diagnosis not present

## 2024-01-23 DIAGNOSIS — W57XXXD Bitten or stung by nonvenomous insect and other nonvenomous arthropods, subsequent encounter: Secondary | ICD-10-CM | POA: Diagnosis not present

## 2024-01-23 MED ORDER — DOXYCYCLINE HYCLATE 100 MG PO CAPS: 100.0000 mg | ORAL_CAPSULE | Freq: Two times a day (BID) | ORAL | 0 refills | Status: AC

## 2024-01-23 NOTE — ED Triage Notes (Signed)
 Pt here for being seen for tick bites on 5/15; pt sts given doxycycline  prophylactic but still not feeling well; fatigue and aches

## 2024-01-23 NOTE — Discharge Instructions (Addendum)
 EKG showed normal rhythm.  Take doxycycline  100 mg --1 capsule 2 times daily for 7 days  We have drawn blood to check your blood counts, kidney and liver function and sugar and electrolytes, and for antibodies to Lyme disease or [facility] spotted fever.  Our staff will notify you if anything is significantly abnormal.  Please make a follow-up appointment with your primary care

## 2024-01-23 NOTE — ED Provider Notes (Signed)
 [facility]    CSN: [id number] Arrival date & time: 01/23/24  1258      History   Chief Complaint Chief Complaint  Patient presents with   Follow-up    HPI Becky Lawson is a 73 y.o. female.   HPI Here for continued fatigue and malaise and a little shortness of breath.  She was seen here on May 15 after having removed a couple of ticks off her.  She is uncertain how long they were there.  A 200 mg doxycycline  dose was prescribed for prophylaxis.  She states that the redness around the tick bite that is there today is about the same as it was 5 days ago.  She has not been able to check her temperature and has not had any headache or nausea or vomiting or abdominal pain.  She has had a little shortness of breath but no chest pain.  She is allergic to no medicines Past Medical History:  Diagnosis Date   Arthritis    Benign cyst of right kidney 2014   2.7 cm right kidney cyst seen on imaging 2014 but was c/o right flank pain with new persistent proteinuria (fortunately hematuria had resolved) so repeat US  11/2016 showed right kidney still with 2.6 cm simple cyst and otherwise nml.   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, genotype 1b, followed at [facility]   Hyperlipidemia    Hypertension    Hypothyroidism    Jaundice 11/01/2012   Pill dysphagia 10/20/2021   Pruritic disorder 02/13/2013   Sleep apnea    Was diagnosed approximately 20 years ago, but does not wear CPAP patient stated "I gave it back.Aaron AasAaron AasI couldn't wear that thing it was horrible"   Spinal stenosis    Thyroid  disease    Phreesia 06/15/2020   Unintentional weight loss of more than 10 pounds 07/12/2018    Patient Active Problem List   Diagnosis Date Noted   Elevated coronary artery  calcium  score 12/25/2023   Diastolic dysfunction 12/25/2023   Closed fracture of fifth metatarsal bone 04/21/2023   Lumbar radiculopathy 11/24/2022   Malignant tumor of thyroid  gland (HCC) 10/26/2022   Vaginal lesion 09/30/2022   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/05/2022   Status post total replacement of right hip 07/05/2022   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   S/P total left hip arthroplasty 08/03/2021   Abnormal liver function tests 04/19/2021   Degeneration of lumbar intervertebral disc 05/25/2020   Tremor 07/12/2018   Prediabetes 07/12/2018   Polypharmacy 07/12/2018   Pain of right hip joint 01/02/2018   Fatty liver 04/18/2017   GERD (gastroesophageal reflux disease) 10/28/2016   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   Chronic throat clearing 06/24/2016   S/P partial thyroidectomy 06/16/2016   Thyroid  nodule 02/08/2016   Hx of hepatitis C 05/19/2015   Vitamin D  deficiency 05/19/2015   Spinal stenosis of lumbar region 05/19/2015   Hyperlipidemia 05/19/2015   Bipolar disorder (HCC) 05/19/2015   Essential hypertension 09/26/2013   Undiagnosed cardiac murmurs 09/26/2013    Past Surgical History:  Procedure Laterality Date   ABDOMINAL HYSTERECTOMY     COLONOSCOPY W/ POLYPECTOMY     JOINT REPLACEMENT Bilateral    knee  KNEE ARTHROSCOPY Bilateral    PARTIAL HYSTERECTOMY     THYROID  LOBECTOMY Left 06/16/2016   THYROID  LOBECTOMY Left 06/16/2016   Procedure: LEFT THYROID  LOBECTOMY;  Surgeon: Aldean Hummingbird, MD;  Location: [facility] OR;  Service: General;  Laterality: Left;   TOTAL HIP ARTHROPLASTY Left 08/03/2021   Procedure: TOTAL HIP ARTHROPLASTY ANTERIOR APPROACH;  Surgeon: Claiborne Crew, MD;  Location: [facility] ORS;  Service: Orthopedics;  Laterality: Left;   TOTAL HIP ARTHROPLASTY Right 07/05/2022   Procedure: TOTAL HIP ARTHROPLASTY ANTERIOR APPROACH;  Surgeon: Claiborne Crew, MD;  Location: [facility] OR[facility];  Service: Orthopedics;  Laterality: Right;    OB History   No  obstetric history on file.      Home Medications    Prior to Admission medications   Medication Sig Start Date End Date Taking? Authorizing Provider  doxycycline  (VIBRAMYCIN ) 100 MG capsule Take 1 capsule (100 mg total) by mouth 2 (two) times daily for 7 days. 01/23/24 01/30/24 Yes Machael Raine, Paige Boatman, MD  acetaminophen  (TYLENOL ) 500 MG tablet Take 2 tablets (1,000 mg total) by mouth every 6 (six) hours. 07/07/22   Earnie Gola, PA-C  ALPRAZolam  (XANAX ) 1 MG tablet TAKE 1/2 TO 1 TABLET BY MOUTH twice daily AS NEEDED FOR ANXIETY 12/25/23   Marita Sidle A, NP  amLODipine  (NORVASC ) 10 MG tablet Take 1 tablet (10 mg total) by mouth daily. 05/10/23   Benjiman Bras, MD  aspirin  EC 81 MG tablet Take 1 tablet (81 mg total) by mouth daily. Swallow whole. 10/27/23   Bridgette Campus, MD  atenolol  (TENORMIN ) 25 MG tablet Take 1 tablet (25 mg total) by mouth daily. 11/15/23 02/13/24  West, Katlyn D, NP  atorvastatin  (LIPITOR) 80 MG tablet Take 1 tablet (80 mg total) by mouth daily. 10/18/23   Bridgette Campus, MD  azelastine  (ASTELIN ) 0.1 % nasal spray Place 1-2 sprays into both nostrils 2 (two) times daily. Use in each nostril as directed 02/09/22   Benjiman Bras, MD  cholestyramine Delfino Fellers) 4 GM/DOSE powder 4 g orally 2 times a day for 30 day(s) 09/11/12   [provider]  dextroamphetamine  (DEXTROSTAT ) 10 MG tablet Take 1 tablet (10 mg total) by mouth every morning. 12/25/23 01/24/24  Lincoln Renshaw, NP  diclofenac (VOLTAREN) 75 MG EC tablet 1 tablet twice a day by oral route.    [provider]  docusate sodium  (COLACE) 100 MG capsule Take 1 capsule (100 mg total) by mouth 2 (two) times daily. 08/04/21   Earnie Gola, PA-C  hydrOXYzine  (ATARAX ) 25 MG tablet TAKE ONE TABLET BY MOUTH every EIGHT hours AS NEEDED 10/24/22   Marita Sidle A, NP  lamoTRIgine  (LAMICTAL ) 100 MG tablet TAKE TWO TABLETS BY MOUTH EVERY MORNING and TAKE ONE TABLET BY MOUTH EVERY EVENING 09/13/23   Lincoln Renshaw, NP   levothyroxine  (SYNTHROID ) 100 MCG tablet TAKE 1 TABLET BY MOUTH DAILY BEFORE BREAKFAST 12/19/23   Benjiman Bras, MD  losartan  (COZAAR ) 50 MG tablet TAKE 1 TABLET BY MOUTH DAILY 12/19/23   Benjiman Bras, MD  Na Sulfate-K Sulfate-Mg Sulf (SUPREP BOWEL PREP KIT) 17.5-3.13-1.6 GM/177ML SOLN Take 1 kit by mouth as directed. For colonoscopy prep Patient not taking: Reported on 12/25/2023 09/18/23   May, Deanna J, NP  nitroGLYCERIN  (NITROSTAT ) 0.4 MG SL tablet Place 1 tablet (0.4 mg total) under the tongue every 5 (five) minutes as needed for chest pain. 11/15/23 02/13/24  West, Katlyn D, NP  omeprazole  (PRILOSEC) 40 MG capsule Take 1 capsule (  40 mg total) by mouth 2 (two) times daily. 09/18/23   May, Deanna J, NP  oxyCODONE -acetaminophen  (PERCOCET) 10-325 MG tablet Take 1 tablet by mouth every 4 (four) hours as needed for pain.    [provider]  polyethylene glycol (MIRALAX  / GLYCOLAX ) 17 g packet Take 17 g by mouth daily as needed for mild constipation. 08/04/21   Earnie Gola, PA-C  QUEtiapine  (SEROQUEL ) 50 MG tablet Take 1 tablet (50 mg total) by mouth daily. 12/25/23   Lincoln Renshaw, NP  traZODone  (DESYREL ) 150 MG tablet TAKE TWO TABLETS BY MOUTH everyday AT bedtime 12/25/23   Marita Sidle A, NP  Vitamin D , Ergocalciferol , (DRISDOL ) 1.25 MG (50000 UNIT) CAPS capsule Take 1 capsule (50,000 Units total) by mouth every 7 (seven) days. Sunday 05/10/23   Benjiman Bras, MD    Family History Family History  Problem Relation Age of Onset   Diabetes Mother    Heart disease Mother    Hyperlipidemia Mother    Hypertension Mother    Stroke Mother    Hypertension Father    Parkinson's disease Father    Heart disease Father    Hyperlipidemia Father    Hemachromatosis Daughter    Thyroid  disease Neg Hx    Colon cancer Neg Hx    Esophageal cancer Neg Hx    Inflammatory bowel disease Neg Hx    Liver disease Neg Hx    Pancreatic cancer Neg Hx    Rectal cancer Neg Hx    Stomach cancer  Neg Hx    Sleep apnea Neg Hx     Social History Social History   Tobacco Use   Smoking status: Former    Current packs/day: 0.00    Average packs/day: 1 pack/day for 15.0 years (15.0 ttl pk-yrs)    Types: Cigarettes    Start date: 06/29/1964    Quit date: 06/30/1979    Years since quitting: 44.5   Smokeless tobacco: Never  Vaping Use   Vaping status: Never Used  Substance Use Topics   Alcohol use: Not Currently    Alcohol/week: 0.0 - 1.0 standard drinks of alcohol   Drug use: Never     Allergies   Patient has no known allergies.   Review of Systems Review of Systems   Physical Exam Triage Vital Signs ED Triage Vitals [01/23/24 1316]  Encounter Vitals Group     BP (!) 166/89     Systolic BP Percentile      Diastolic BP Percentile      Pulse Rate 92     Resp 18     Temp 98.5 F (36.9 C)     Temp Source Oral     SpO2 95 %     Weight      Height      Head Circumference      Peak Flow      Pain Score 5     Pain Loc      Pain Education      Exclude from Growth Chart    No data found.  Updated Vital Signs BP (!) 166/89 (BP Location: Right Arm)   Pulse 92   Temp 98.5 F (36.9 C) (Oral)   Resp 18   SpO2 95%   Visual Acuity Right Eye Distance:   Left Eye Distance:   Bilateral Distance:    Right Eye Near:   Left Eye Near:    Bilateral Near:     Physical Exam Vitals reviewed.  Constitutional:      General: She is not in acute distress.    Appearance: She is not toxic-appearing.  HENT:     Mouth/Throat:     Mouth: Mucous membranes are moist.  Eyes:     Extraocular Movements: Extraocular movements intact.     Conjunctiva/sclera: Conjunctivae normal.     Pupils: Pupils are equal, round, and reactive to light.  Cardiovascular:     Rate and Rhythm: Normal rate.     Comments: Normal rate but rhythm is slightly irregular with an occasional extra beat. Pulmonary:     Effort: Pulmonary effort is normal. No respiratory distress.     Breath sounds:  Normal breath sounds. No stridor. No wheezing, rhonchi or rales.  Abdominal:     Palpations: Abdomen is soft.  Skin:    Coloration: Skin is not jaundiced or pale.     Comments: There is an eschar about 2 to 3 mm in diameter with a slight halo of mild erythema around it on her right upper anterior arm.  No drainage  Neurological:     General: No focal deficit present.     Mental Status: She is alert and oriented to person, place, and time.  Psychiatric:        Behavior: Behavior normal.      UC Treatments / Results  Labs (all labs ordered are listed, but only abnormal results are displayed) Labs Reviewed  CBC WITH DIFFERENTIAL/PLATELET  COMPREHENSIVE METABOLIC PANEL WITH GFR  LYME DISEASE SEROLOGY W/REFLEX  SPOTTED FEVER GROUP ANTIBODIES    EKG   Radiology No results found.  Procedures Procedures (including critical care time)  Medications Ordered in UC Medications - No data to display  Initial Impression / Assessment and Plan / UC Course  I have reviewed the triage vital signs and the nursing notes.  Pertinent labs & imaging results that were available during my care of the patient were reviewed by me and considered in my medical decision making (see chart for details).  EKG is ordered for the irregular heart rhythm I hear on exam.  It shows normal sinus rhythm and no ectopic beats on that.  7-day supply of doxycycline  is sent in to treat potential tick bite infection symptoms.  CBC, CMP, Lyme titers, and [place] spotted fever titers are drawn today.  Our staff will notify her if anything is significantly abnormal.  I have asked her to please set up a follow-up with her primary care, so that if she does not improve with treatment and we do not turn up any cause of her symptoms, she can be evaluated further with them. Final Clinical Impressions(s) / UC Diagnoses   Final diagnoses:  Fatigue, unspecified type  Tick bite of right upper arm, subsequent encounter   Dyspnea, unspecified type     Discharge Instructions      EKG showed normal rhythm.  Take doxycycline  100 mg --1 capsule 2 times daily for 7 days  We have drawn blood to check your blood counts, kidney and liver function and sugar and electrolytes, and for antibodies to Lyme disease or [facility] spotted fever.  Our staff will notify you if anything is significantly abnormal.  Please make a follow-up appointment with your primary care   ED Prescriptions     Medication Sig Dispense Auth. Provider   doxycycline  (VIBRAMYCIN ) 100 MG capsule Take 1 capsule (100 mg total) by mouth 2 (two) times daily for 7 days. 14 capsule Davy Westmoreland K, MD  I have reviewed the PDMP during this encounter.   Ann Keto, MD 01/23/24 [phone]

## 2024-01-25 ENCOUNTER — Ambulatory Visit (HOSPITAL_COMMUNITY): Payer: Self-pay

## 2024-01-25 LAB — SPOTTED FEVER GROUP ANTIBODIES
Spotted Fever Group IgG: <1:64 {titer}
Spotted Fever Group IgM: <1:64 {titer}

## 2024-01-25 LAB — CBC WITH DIFFERENTIAL/PLATELET
Basophils Absolute: 0 10*3/uL (ref 0.0–0.2)
Basos: 0 %
EOS (ABSOLUTE): 0 10*3/uL (ref 0.0–0.4)
Eos: 0 %
Hematocrit: 41.9 % (ref 34.0–46.6)
Hemoglobin: 13.7 g/dL (ref 11.1–15.9)
Immature Grans (Abs): 0 10*3/uL (ref 0.0–0.1)
Immature Granulocytes: 1 %
Lymphocytes Absolute: 1.2 10*3/uL (ref 0.7–3.1)
Lymphs: 16 %
MCH: 30.7 pg (ref 26.6–33.0)
MCHC: 32.7 g/dL (ref 31.5–35.7)
MCV: 94 fL (ref 79–97)
Monocytes Absolute: 0.6 10*3/uL (ref 0.1–0.9)
Monocytes: 8 %
Neutrophils Absolute: 5.7 10*3/uL (ref 1.4–7.0)
Neutrophils: 75 %
Platelets: 323 10*3/uL (ref 150–450)
RBC: 4.46 x10E6/uL (ref 3.77–5.28)
RDW: 13.6 % (ref 11.7–15.4)
WBC: 7.6 10*3/uL (ref 3.4–10.8)

## 2024-01-25 LAB — COMPREHENSIVE METABOLIC PANEL WITH GFR
ALT: 13 IU/L (ref 0–32)
AST: 21 IU/L (ref 0–40)
Albumin: 4.7 g/dL (ref 3.8–4.8)
Alkaline Phosphatase: 94 IU/L (ref 44–121)
BUN/Creatinine Ratio: 15 (ref 12–28)
BUN: 13 mg/dL (ref 8–27)
Bilirubin Total: 0.5 mg/dL (ref 0.0–1.2)
CO2: 20 mmol/L (ref 20–29)
Calcium: 10.2 mg/dL (ref 8.7–10.3)
Chloride: 105 mmol/L (ref 96–106)
Creatinine, Ser: 0.86 mg/dL (ref 0.57–1.00)
Globulin, Total: 2.4 g/dL (ref 1.5–4.5)
Glucose: 118 mg/dL — ABNORMAL HIGH (ref 70–99)
Potassium: 5 mmol/L (ref 3.5–5.2)
Sodium: 142 mmol/L (ref 134–144)
Total Protein: 7.1 g/dL (ref 6.0–8.5)
eGFR: 71 mL/min/{1.73_m2} (ref >59)

## 2024-01-25 LAB — LYME DISEASE SEROLOGY W/REFLEX: Lyme Total Antibody EIA: NEGATIVE

## 2024-01-30 DIAGNOSIS — M5416 Radiculopathy, lumbar region: Secondary | ICD-10-CM | POA: Diagnosis not present

## 2024-01-30 DIAGNOSIS — M51362 Other intervertebral disc degeneration, lumbar region with discogenic back pain and lower extremity pain: Secondary | ICD-10-CM | POA: Diagnosis not present

## 2024-01-30 DIAGNOSIS — M47896 Other spondylosis, lumbar region: Secondary | ICD-10-CM | POA: Diagnosis not present

## 2024-01-30 DIAGNOSIS — Z79891 Long term (current) use of opiate analgesic: Secondary | ICD-10-CM | POA: Diagnosis not present

## 2024-01-30 DIAGNOSIS — M25512 Pain in left shoulder: Secondary | ICD-10-CM | POA: Diagnosis not present

## 2024-02-13 ENCOUNTER — Encounter

## 2024-03-13 ENCOUNTER — Ambulatory Visit: Payer: PPO | Admitting: Behavioral Health

## 2024-03-13 ENCOUNTER — Encounter: Payer: Self-pay | Admitting: Behavioral Health

## 2024-03-13 DIAGNOSIS — F411 Generalized anxiety disorder: Secondary | ICD-10-CM | POA: Diagnosis not present

## 2024-03-13 DIAGNOSIS — F3130 Bipolar disorder, current episode depressed, mild or moderate severity, unspecified: Secondary | ICD-10-CM

## 2024-03-13 DIAGNOSIS — F99 Mental disorder, not otherwise specified: Secondary | ICD-10-CM

## 2024-03-13 DIAGNOSIS — F5105 Insomnia due to other mental disorder: Secondary | ICD-10-CM | POA: Diagnosis not present

## 2024-03-13 DIAGNOSIS — F3162 Bipolar disorder, current episode mixed, moderate: Secondary | ICD-10-CM

## 2024-03-13 MED ORDER — TRAZODONE HCL 150 MG PO TABS: ORAL_TABLET | ORAL | 1 refills | Status: DC

## 2024-03-13 MED ORDER — ALPRAZOLAM 1 MG PO TABS: ORAL_TABLET | ORAL | 1 refills | Status: DC

## 2024-03-13 MED ORDER — LAMOTRIGINE 100 MG PO TABS: ORAL_TABLET | ORAL | 1 refills | Status: DC

## 2024-03-13 MED ORDER — QUETIAPINE FUMARATE 50 MG PO TABS: 50.0000 mg | ORAL_TABLET | Freq: Every day | ORAL | 1 refills | Status: DC

## 2024-03-13 NOTE — Progress Notes (Signed)
 [facility]  Patient ID: Becky Lawson,  MRN: [id number]  PCP: Levora Reyes SAUNDERS, MD  Date of Evaluation: 03/13/2024 Time spent:30 minutes  Chief Complaint:  Chief Complaint   Depression; Anxiety; Follow-up; Medication Problem; Patient Education; Medication Refill; Fatigue     HISTORY/CURRENT STATUS: HPI Becky Lawson, 73 year old female  presents for follow-up and medication management. No manic episodes since last visit.  Continue to present with good stability. Says her biggest concern today is not being able to take her dextrostat  which increases her fatigue during the day. However, she understands that it is Not recommended she resume until further cleared by cardiology. She has PET scan and f/u with cardiology scheduled. Says they did determine that she has some arterial blockages but does not know the extent.  Says she becomes very out of breath and fatigue at slight exertion. Says her anxiety today is 3/10 and depression is 3/10. She was sleeping  7 hours per night with aid of medication. She denies mania, no psychosis, NO SI/HI.   Past medications for mental health diagnoses include: Lithium , Seroquel , Effexor, Prozac, Latuda , Abilify, Dexedrine , Cymbalta  caused weight gain.  Modafinil .      Individual Medical History/ Review of Systems: Changes? :No   Allergies: Patient has no known allergies.  Current Medications:  Current Outpatient Medications:    acetaminophen  (TYLENOL ) 500 MG tablet, Take 2 tablets (1,000 mg total) by mouth every 6 (six) hours., Disp: 30 tablet, Rfl: 0   ALPRAZolam  (XANAX ) 1 MG tablet, TAKE 1/2 TO 1 TABLET BY MOUTH twice daily AS NEEDED FOR ANXIETY, Disp: 90 tablet, Rfl: 1   amLODipine  (NORVASC ) 10 MG tablet, Take 1 tablet (10 mg total) by mouth daily., Disp: 90 tablet, Rfl: 1   aspirin  EC 81 MG tablet, Take 1 tablet (81 mg total) by mouth daily. Swallow whole., Disp: 30 tablet, Rfl: 12   atenolol  (TENORMIN ) 25 MG tablet, Take 1 tablet (25  mg total) by mouth daily., Disp: 90 tablet, Rfl: 3   atorvastatin  (LIPITOR) 80 MG tablet, Take 1 tablet (80 mg total) by mouth daily., Disp: 90 tablet, Rfl: 3   azelastine  (ASTELIN ) 0.1 % nasal spray, Place 1-2 sprays into both nostrils 2 (two) times daily. Use in each nostril as directed, Disp: 30 mL, Rfl: 3   cholestyramine (QUESTRAN) 4 GM/DOSE powder, 4 g orally 2 times a day for 30 day(s), Disp: , Rfl:    dextroamphetamine  (DEXTROSTAT ) 10 MG tablet, Take 1 tablet (10 mg total) by mouth every morning., Disp: 30 tablet, Rfl: 0   diclofenac (VOLTAREN) 75 MG EC tablet, 1 tablet twice a day by oral route., Disp: , Rfl:    docusate sodium  (COLACE) 100 MG capsule, Take 1 capsule (100 mg total) by mouth 2 (two) times daily., Disp: 10 capsule, Rfl: 0   hydrOXYzine  (ATARAX ) 25 MG tablet, TAKE ONE TABLET BY MOUTH every EIGHT hours AS NEEDED, Disp: 60 tablet, Rfl: 5   lamoTRIgine  (LAMICTAL ) 100 MG tablet, TAKE TWO TABLETS BY MOUTH EVERY MORNING and TAKE ONE TABLET BY MOUTH EVERY EVENING, Disp: 270 tablet, Rfl: 1   levothyroxine  (SYNTHROID ) 100 MCG tablet, TAKE 1 TABLET BY MOUTH DAILY BEFORE BREAKFAST, Disp: 90 tablet, Rfl: 1   losartan  (COZAAR ) 50 MG tablet, TAKE 1 TABLET BY MOUTH DAILY, Disp: 90 tablet, Rfl: 1   Na Sulfate-K Sulfate-Mg Sulf (SUPREP BOWEL PREP KIT) 17.5-3.13-1.6 GM/177ML SOLN, Take 1 kit by mouth as directed. For colonoscopy prep (Patient not taking: Reported on 12/25/2023),  Disp: 354 mL, Rfl: 0   nitroGLYCERIN  (NITROSTAT ) 0.4 MG SL tablet, Place 1 tablet (0.4 mg total) under the tongue every 5 (five) minutes as needed for chest pain., Disp: 25 tablet, Rfl: 2   omeprazole  (PRILOSEC) 40 MG capsule, Take 1 capsule (40 mg total) by mouth 2 (two) times daily., Disp: 60 capsule, Rfl: 2   oxyCODONE -acetaminophen  (PERCOCET) 10-325 MG tablet, Take 1 tablet by mouth every 4 (four) hours as needed for pain., Disp: , Rfl:    polyethylene glycol (MIRALAX  / GLYCOLAX ) 17 g packet, Take 17 g by mouth daily  as needed for mild constipation., Disp: 14 each, Rfl: 0   QUEtiapine  (SEROQUEL ) 50 MG tablet, Take 1 tablet (50 mg total) by mouth daily., Disp: 90 tablet, Rfl: 1   traZODone  (DESYREL ) 150 MG tablet, TAKE TWO TABLETS BY MOUTH everyday AT bedtime, Disp: 180 tablet, Rfl: 1   Vitamin D , Ergocalciferol , (DRISDOL ) 1.25 MG (50000 UNIT) CAPS capsule, Take 1 capsule (50,000 Units total) by mouth every 7 (seven) days. Sunday, Disp: 12 capsule, Rfl: 3  Current Facility-Administered Medications:    cyanocobalamin  ((VITAMIN B-12)) injection 1,000 mcg, 1,000 mcg, Intramuscular, Q30 days, Loreli Elyn SAILOR, MD, 1,000 mcg at 07/12/18 1652 Medication Side Effects: none  Family Medical/ Social History: Changes? No  MENTAL HEALTH EXAM:  There were no vitals taken for this visit.There is no height or weight on file to calculate BMI.  General Appearance: Casual, Neat, and Well Groomed  Eye Contact:  Good  Speech:  Clear and Coherent  Volume:  Normal  Mood:  NA  Affect:  Appropriate  Thought Process:  Coherent  Orientation:  Full (Time, Place, and Person)  Thought Content: Logical   Suicidal Thoughts:  No  Homicidal Thoughts:  No  Memory:  WNL  Judgement:  Good  Insight:  Good  Psychomotor Activity:  Normal  Concentration:  Concentration: Good  Recall:  Good  Fund of Knowledge: Good  Language: Good  Assets:  Desire for Improvement  ADL's:  Intact  Cognition: WNL  Prognosis:  Good    DIAGNOSES:    ICD-10-CM   1. Generalized anxiety disorder  F41.1 ALPRAZolam  (XANAX ) 1 MG tablet    traZODone  (DESYREL ) 150 MG tablet    QUEtiapine  (SEROQUEL ) 50 MG tablet    2. Bipolar I disorder, most recent episode depressed (HCC)  F31.30 lamoTRIgine  (LAMICTAL ) 100 MG tablet    3. Insomnia due to other mental disorder  F51.05 traZODone  (DESYREL ) 150 MG tablet   F99 QUEtiapine  (SEROQUEL ) 50 MG tablet    4. Bipolar 1 disorder, mixed, moderate (HCC)  F31.62 QUEtiapine  (SEROQUEL ) 50 MG tablet      Receiving  Psychotherapy: No    RECOMMENDATIONS:   Greater than 50% of 30 min of face to face time  was spent on counseling and coordination of care. Discussed her continued stability. She request no medication changes this visit. She did have to stop taking her Dextrostat  due to chest pain, increased BP.  Says she tried taking the medication one week after occurrence and chest pain returned. I do not feel comfortable in prescribing any stimulant medication until she is cleared by cardiology at this time. She understands although frustrated and fatigued more during the day.  Most likely fatigue is also related to age and progressing cardiovascular disease. Further diagnostics pending.  She is still working out at [employer]. Will be following up with Cardiology.    -Conducted AIMS  Score of 3 today.  03/13/2024 Continue Seroquel  50  mg daily at bedtime Continue hydroxyzine  25 mg nightly 8H as needed. Continue Lamictal   300 mg daily. 200 mg in the am and 100 mg in the evening. Continue the  trazodone  300 mg, 1.5 pills nightly. Continue Mirtazapine  7.5 mg at bedtime. May increase to 15 mg if needed.  Continue Xanax  1 mg at bedtime but if taking  Pt to schedule follow up after cardiology  further assesses Discussed potential benefits, risks, and side effects of stimulants with patient to include increased heart rate, palpitations, insomnia, increased anxiety, increased irritability, or decreased appetite.  Instructed patient to contact office if experiencing any significant tolerability issues.  Discussed potential metabolic side effects associated with atypical antipsychotics, as well as potential risk for movement side effects. Advised pt to contact office if movement side effects occur.   PDMP was reviewed   Redell A. Arieal Cuoco, NP

## 2024-03-19 ENCOUNTER — Ambulatory Visit (INDEPENDENT_AMBULATORY_CARE_PROVIDER_SITE_OTHER): Admitting: *Deleted

## 2024-03-19 VITALS — Ht 64.5 in | Wt 183.0 lb

## 2024-03-19 DIAGNOSIS — Z0000 Encounter for general adult medical examination without abnormal findings: Secondary | ICD-10-CM | POA: Diagnosis not present

## 2024-03-19 NOTE — Progress Notes (Signed)
 Subjective:   Becky Lawson is a 73 y.o. female who presents for Medicare Annual (Subsequent) preventive examination.  Visit Complete: Virtual I connected with  [name] on 03/19/24 by a audio enabled telemedicine application and verified that I am speaking with the correct person using two identifiers.  Patient Location: Home  Provider Location: Home Office  I discussed the limitations of evaluation and management by telemedicine. The patient expressed understanding and agreed to proceed.  Vital Signs: Because this visit was a virtual/telehealth visit, some criteria may be missing or patient reported. Any vitals not documented were not able to be obtained and vitals that have been documented are patient reported.   Cardiac Risk Factors include: advanced age (>59men, >35 women);hypertension;obesity (BMI >30kg/m2)     Objective:    Today's Vitals   03/19/24 1442  Weight: 183 lb (83 kg)  Height: 5' 4.5 (1.638 m)   Body mass index is 30.93 kg/m.     03/19/2024    2:37 PM 12/21/2022    4:04 PM 07/05/2022    6:00 PM 06/23/2022    2:28 PM 12/16/2021    4:01 PM 08/03/2021    6:15 PM 07/21/2021    2:09 PM  Advanced Directives  Does Patient Have a Medical Advance Directive? Yes No Yes Yes Yes Yes Yes  Type of Forensic scientist of [place];Living will  Healthcare Power of [place];Living will Healthcare Power of [place];Living will Healthcare Power of [place];Living will  Does patient want to make changes to medical advance directive?   No - Patient declined No - Patient declined  No - Patient declined   Copy of Healthcare Power of Attorney in Chart? Yes - validated most recent copy scanned in chart (See row information)    No - copy requested    Would patient like information on creating a medical advance directive?  No - Patient declined         Current Medications (verified) Outpatient Encounter Medications as of 03/19/2024   Medication Sig   acetaminophen  (TYLENOL ) 500 MG tablet Take 2 tablets (1,000 mg total) by mouth every 6 (six) hours.   ALPRAZolam  (XANAX ) 1 MG tablet TAKE 1/2 TO 1 TABLET BY MOUTH twice daily AS NEEDED FOR ANXIETY   amLODipine  (NORVASC ) 10 MG tablet Take 1 tablet (10 mg total) by mouth daily.   aspirin  EC 81 MG tablet Take 1 tablet (81 mg total) by mouth daily. Swallow whole.   atenolol  (TENORMIN ) 25 MG tablet Take 1 tablet (25 mg total) by mouth daily.   atorvastatin  (LIPITOR) 80 MG tablet Take 1 tablet (80 mg total) by mouth daily.   azelastine  (ASTELIN ) 0.1 % nasal spray Place 1-2 sprays into both nostrils 2 (two) times daily. Use in each nostril as directed   cholestyramine (QUESTRAN) 4 GM/DOSE powder 4 g orally 2 times a day for 30 day(s)   dextroamphetamine  (DEXTROSTAT ) 10 MG tablet Take 1 tablet (10 mg total) by mouth every morning.   diclofenac (VOLTAREN) 75 MG EC tablet 1 tablet twice a day by oral route.   docusate sodium  (COLACE) 100 MG capsule Take 1 capsule (100 mg total) by mouth 2 (two) times daily.   hydrOXYzine  (ATARAX ) 25 MG tablet TAKE ONE TABLET BY MOUTH every EIGHT hours AS NEEDED   lamoTRIgine  (LAMICTAL ) 100 MG tablet TAKE TWO TABLETS BY MOUTH EVERY MORNING and TAKE ONE TABLET BY MOUTH EVERY EVENING   levothyroxine  (SYNTHROID ) 100 MCG tablet TAKE 1  TABLET BY MOUTH DAILY BEFORE BREAKFAST   losartan  (COZAAR ) 50 MG tablet TAKE 1 TABLET BY MOUTH DAILY   Na Sulfate-K Sulfate-Mg Sulf (SUPREP BOWEL PREP KIT) 17.5-3.13-1.6 GM/177ML SOLN Take 1 kit by mouth as directed. For colonoscopy prep   nitroGLYCERIN  (NITROSTAT ) 0.4 MG SL tablet Place 1 tablet (0.4 mg total) under the tongue every 5 (five) minutes as needed for chest pain.   omeprazole  (PRILOSEC) 40 MG capsule Take 1 capsule (40 mg total) by mouth 2 (two) times daily.   oxyCODONE -acetaminophen  (PERCOCET) 10-325 MG tablet Take 1 tablet by mouth every 4 (four) hours as needed for pain.   polyethylene glycol (MIRALAX  / GLYCOLAX )  17 g packet Take 17 g by mouth daily as needed for mild constipation.   QUEtiapine  (SEROQUEL ) 50 MG tablet Take 1 tablet (50 mg total) by mouth daily.   traZODone  (DESYREL ) 150 MG tablet TAKE TWO TABLETS BY MOUTH everyday AT bedtime   Vitamin D , Ergocalciferol , (DRISDOL ) 1.25 MG (50000 UNIT) CAPS capsule Take 1 capsule (50,000 Units total) by mouth every 7 (seven) days. Sunday   Facility-Administered Encounter Medications as of 03/19/2024  Medication   cyanocobalamin  ((VITAMIN B-12)) injection 1,000 mcg    Allergies (verified) Patient has no known allergies.   History: Past Medical History:  Diagnosis Date   Arthritis    Benign cyst of right kidney 2014   2.7 cm right kidney cyst seen on imaging 2014 but was c/o right flank pain with new persistent proteinuria (fortunately hematuria had resolved) so repeat US  11/2016 showed right kidney still with 2.6 cm simple cyst and otherwise nml.   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, genotype 1b, followed at [facility]   Hyperlipidemia    Hypertension    Hypothyroidism    Jaundice 11/01/2012   Pill dysphagia 10/20/2021   Pruritic disorder 02/13/2013   Sleep apnea    Was diagnosed approximately 20 years ago, but does not wear CPAP patient stated I gave it back.SABRASABRAI couldn't wear that thing it was horrible   Spinal stenosis    Thyroid  disease    [employer] 06/15/2020   Unintentional weight loss of more than 10 pounds 07/12/2018   Past Surgical History:  Procedure Laterality Date   ABDOMINAL HYSTERECTOMY     COLONOSCOPY W/ POLYPECTOMY     JOINT REPLACEMENT Bilateral    knee   KNEE ARTHROSCOPY Bilateral    PARTIAL HYSTERECTOMY     THYROID  LOBECTOMY Left 06/16/2016   THYROID  LOBECTOMY Left 06/16/2016   Procedure: LEFT  THYROID  LOBECTOMY;  Surgeon: Camellia Blush, MD;  Location: [facility] OR;  Service: General;  Laterality: Left;   TOTAL HIP ARTHROPLASTY Left 08/03/2021   Procedure: TOTAL HIP ARTHROPLASTY ANTERIOR APPROACH;  Surgeon: Ernie Cough, MD;  Location: [facility] ORS;  Service: Orthopedics;  Laterality: Left;   TOTAL HIP ARTHROPLASTY Right 07/05/2022   Procedure: TOTAL HIP ARTHROPLASTY ANTERIOR APPROACH;  Surgeon: Ernie Cough, MD;  Location: [facility] ORS;  Service: Orthopedics;  Laterality: Right;   Family History  Problem Relation Age of Onset   Diabetes Mother    Heart disease Mother    Hyperlipidemia Mother    Hypertension Mother    Stroke Mother    Hypertension Father  Parkinson's disease Father    Heart disease Father    Hyperlipidemia Father    Hemachromatosis Daughter    Thyroid  disease Neg Hx    Colon cancer Neg Hx    Esophageal cancer Neg Hx    Inflammatory bowel disease Neg Hx    Liver disease Neg Hx    Pancreatic cancer Neg Hx    Rectal cancer Neg Hx    Stomach cancer Neg Hx    Sleep apnea Neg Hx    Social History   Socioeconomic History   Marital status: Divorced    Spouse name: Not on file   Number of children: Not on file   Years of education: Not on file   Highest education level: Associate degree: academic program  Occupational History   Occupation: Personal Caregiver  Tobacco Use   Smoking status: Former    Current packs/day: 0.00    Average packs/day: 1 pack/day for 15.0 years (15.0 ttl pk-yrs)    Types: Cigarettes    Start date: 06/29/1964    Quit date: 06/30/1979    Years since quitting: 44.7   Smokeless tobacco: Never  Vaping Use   Vaping status: Never Used  Substance and Sexual Activity   Alcohol use: Not Currently    Alcohol/week: 0.0 - 1.0 standard drinks of alcohol   Drug use: Never   Sexual activity: Not Currently    Birth control/protection: None  Other Topics Concern   Not on file  Social History Narrative   Divorced. Education: [employer]. Exercise: No.    Left handed   Lives alone   Caffeine: 1/4-1/2 cup/day   Social Drivers of Corporate investment banker Strain: Low Risk  (03/19/2024)   Overall Financial Resource Strain (CARDIA)    Difficulty of Paying Living Expenses: Not hard at all  Food Insecurity: No Food Insecurity (03/19/2024)   Hunger Vital Sign    Worried About Running Out of Food in the Last Year: Never true    Ran Out of Food in the Last Year: Never true  Transportation Needs: No Transportation Needs (03/19/2024)   PRAPARE - Administrator, Civil Service (Medical): No    Lack of Transportation (Non-Medical): No  Physical Activity: Insufficiently Active (03/19/2024)   Exercise Vital Sign    Days of Exercise per Week: 2 days    Minutes of Exercise per Session: 50 min  Stress: Stress Concern Present (03/19/2024)   [employer] of Occupational Health - Occupational Stress Questionnaire    Feeling of Stress: To some extent  Social Connections: Moderately Integrated (03/19/2024)   Social Connection and Isolation Panel    Frequency of Communication with Friends and Family: More than three times a week    Frequency of Social Gatherings with Friends and Family: More than three times a week    Attends Religious Services: More than 4 times per year    Active Member of [employer] or Organizations: Yes    Attends Engineer, structural: More than 4 times per year    Marital Status: Divorced    Tobacco Counseling Counseling given: Not Answered   Clinical Intake:  Pre-visit preparation completed: Yes  Pain : No/denies pain     Diabetes: No  How often do you need to have someone help you when you read instructions, pamphlets, or other written materials from your doctor or pharmacy?: 1 - Never  Interpreter Needed?: No  Information entered by :: Mliss Graff LPN   Activities of Daily Living  03/19/2024    2:50 PM  In your present state of health, do you have any difficulty performing the following  activities:  Hearing? 0  Vision? 0  Difficulty concentrating or making decisions? 0  Walking or climbing stairs? 1  Dressing or bathing? 0  Doing errands, shopping? 0  Preparing Food and eating ? N  Using the Toilet? N  In the past six months, have you accidently leaked urine? Y  Do you have problems with loss of bowel control? N  Managing your Medications? N  Managing your Finances? N  Housekeeping or managing your Housekeeping? N    Patient Care Team: Levora Reyes SAUNDERS, MD as PCP - General (Family Medicine) Neal Roslynn RIGGERS as Physician Assistant (Psychiatry) Noralyn Standing, MD as Consulting Physician (Gastroenterology) Kassie Mallick, MD (Inactive) as Consulting Physician (Endocrinology) Tanda Locus, MD as Consulting Physician (General Surgery) Bonner Ade, MD as Consulting Physician (Physical Medicine and Rehabilitation) Nicholaus Sherlean CROME, [facility] (Inactive) (Pharmacist) Court Dorn PARAS, MD as Consulting Physician (Cardiology)  Indicate any recent Medical Services you may have received from other than [facility] providers in the past year (date may be approximate).     Assessment:   This is a routine wellness examination for Andrell.  Hearing/Vision screen Hearing Screening - Comments:: No trouble hearing Vision Screening - Comments:: Goes every couple years Unsure of name   Goals Addressed             This Visit's Progress    DIET - EAT MORE FRUITS AND VEGETABLES   On track    More exercise      Patient Stated       Functioning better at home       Depression Screen    03/19/2024    2:40 PM 09/26/2023   11:31 AM 06/26/2023    3:28 PM 05/10/2023    4:11 PM 12/21/2022    4:07 PM 12/14/2022    3:53 PM 09/30/2022    1:36 PM  PHQ 2/9 Scores  PHQ - 2 Score 4 3 4 4 2 2  0  PHQ- 9 Score 10 10 8 8 7 6  0    Fall Risk    03/19/2024    2:36 PM 09/26/2023   11:30 AM 06/26/2023    3:27 PM 05/10/2023    4:10 PM 12/14/2022    3:53 PM  Fall Risk   Falls in the past  year? 1 1 0 1 0  Number falls in past yr: 0 1 0 0 0  Injury with Fall? 0 1 0 1 0  Comment    broken left foot   Risk for fall due to :   No Fall Risks No Fall Risks No Fall Risks  Follow up Falls evaluation completed;Education provided;Falls prevention discussed  Falls evaluation completed Falls evaluation completed     MEDICARE RISK AT HOME: Medicare Risk at Home Any stairs in or around the home?: No If so, are there any without handrails?: No Home free of loose throw rugs in walkways, pet beds, electrical cords, etc?: Yes Adequate lighting in your home to reduce risk of falls?: Yes Life alert?: No Use of a cane, walker or w/c?: Yes Grab bars in the bathroom?: No Shower chair or bench in shower?: No Elevated toilet seat or a handicapped toilet?: No  TIMED UP AND GO:  Was the test performed?  No    Cognitive Function:        03/19/2024    2:39 PM 12/21/2022  4:04 PM 01/09/2019    2:50 PM  6CIT Screen  What Year? 0 points 0 points 0 points  What month? 0 points 0 points 0 points  What time? 0 points 0 points 0 points  Count back from 20 0 points 0 points 0 points  Months in reverse 0 points 0 points 0 points  Repeat phrase 2 points 0 points 0 points  Total Score 2 points 0 points 0 points    Immunizations Immunization History  Administered Date(s) Administered   Fluad Quad(high Dose 65+) 05/23/2019, 07/13/2020, 07/19/2021, 06/13/2022   Hepatitis A, Adult 09/18/2023   Hepb-cpg 09/18/2023, 10/23/2023   Influenza, High Dose Seasonal PF 04/27/2017, 05/15/2018   Influenza,inj,Quad PF,6+ Mos 05/19/2015, 06/17/2016   PFIZER(Purple Top)SARS-COV-2 Vaccination 11/07/2019, 11/29/2019, 06/26/2020   Pneumococcal Conjugate-13 07/12/2018   Pneumococcal Polysaccharide-23 06/17/2016   Tdap 01/29/2017    TDAP status: Due, Education has been provided regarding the importance of this vaccine. Advised may receive this vaccine at local pharmacy or Health Dept. Aware to provide a copy  of the vaccination record if obtained from local pharmacy or Health Dept. Verbalized acceptance and understanding.  Flu Vaccine status: Up to date  Pneumococcal vaccine status: Up to date  Covid-19 vaccine status: Information provided on how to obtain vaccines.   Qualifies for Shingles Vaccine? Yes   Zostavax completed No   Shingrix Completed?: No.    Education has been provided regarding the importance of this vaccine. Patient has been advised to call insurance company to determine out of pocket expense if they have not yet received this vaccine. Advised may also receive vaccine at local pharmacy or Health Dept. Verbalized acceptance and understanding.  Screening Tests Health Maintenance  Topic Date Due   MAMMOGRAM  09/29/2023   INFLUENZA VACCINE  04/05/2024   Medicare Annual Wellness (AWV)  03/19/2025   Fecal DNA (Cologuard)  07/23/2026   DTaP/Tdap/Td (2 - Td or Tdap) 01/30/2027   Pneumococcal Vaccine: 50+ Years  Completed   Hepatitis B Vaccines  Completed   DEXA SCAN  Completed   Hepatitis C Screening  Completed   HPV VACCINES  Aged Out   Meningococcal B Vaccine  Aged Out   COVID-19 Vaccine  Discontinued   Zoster Vaccines- Shingrix  Discontinued    Health Maintenance  Health Maintenance Due  Topic Date Due   MAMMOGRAM  09/29/2023    Colorectal cancer screening: Type of screening: Cologuard. Completed 205. Repeat every 0 years  patient had a positive cologuard  will schedule Colonoscopy  Mammogram  Education provided  Bone Density  Education provided  Lung Cancer Screening: (Low Dose CT Chest recommended if Age 8-80 years, 20 pack-year currently smoking OR have quit w/in 15years.) does not qualify.   Lung Cancer Screening Referral:   Additional Screening:  Hepatitis C Screening: does not qualify; Completed 2020  Vision Screening: Recommended annual ophthalmology exams for early detection of glaucoma and other disorders of the eye. Is the patient up to date with  their annual eye exam?  No  Who is the provider or what is the name of the office in which the patient attends annual eye exams? Goes every couple years  unsure of name If pt is not established with a provider, would they like to be referred to a provider to establish care? No .   Dental Screening: Recommended annual dental exams for proper oral hygiene    Community Resource Referral / Chronic Care Management: CRR required this visit?  No   CCM  required this visit?  No     Plan:     I have personally reviewed and noted the following in the patient's chart:   Medical and social history Use of alcohol, tobacco or illicit drugs  Current medications and supplements including opioid prescriptions. Patient is not currently taking opioid prescriptions. Functional ability and status Nutritional status Physical activity Advanced directives List of other physicians Hospitalizations, surgeries, and ER visits in previous 12 months Vitals Screenings to include cognitive, depression, and falls Referrals and appointments  In addition, I have reviewed and discussed with patient certain preventive protocols, quality metrics, and best practice recommendations. A written personalized care plan for preventive services as well as general preventive health recommendations were provided to patient.     Mliss Graff, LPN   [id number]/84/[id number]   After Visit Summary: (MyChart) Due to this being a telephonic visit, the after visit summary with patients personalized plan was offered to patient via MyChart   Nurse Notes:

## 2024-03-25 ENCOUNTER — Encounter (HOSPITAL_COMMUNITY): Payer: Self-pay

## 2024-03-26 ENCOUNTER — Telehealth (HOSPITAL_COMMUNITY): Payer: Self-pay | Admitting: *Deleted

## 2024-03-26 NOTE — Telephone Encounter (Signed)
Reaching out to patient to offer assistance regarding upcoming cardiac imaging study; pt verbalizes understanding of appt date/time, parking situation and where to check in, pre-test NPO status, and verified current allergies; name and call back number provided for further questions should they arise  Aailyah Dunbar RN Navigator Cardiac Imaging [facility] [phone] office [phone] cell  Patient aware to avoid caffeine 12 hours prior to her cardiac PET scan.  

## 2024-03-27 ENCOUNTER — Ambulatory Visit (HOSPITAL_COMMUNITY)
Admission: RE | Admit: 2024-03-27 | Discharge: 2024-03-27 | Disposition: A | Discharge status: Home / Self Care | Source: Ambulatory Visit | Attending: Cardiovascular Disease | Admitting: Cardiovascular Disease

## 2024-03-27 DIAGNOSIS — R931 Abnormal findings on diagnostic imaging of heart and coronary circulation: Secondary | ICD-10-CM | POA: Insufficient documentation

## 2024-03-27 DIAGNOSIS — E782 Mixed hyperlipidemia: Secondary | ICD-10-CM | POA: Diagnosis not present

## 2024-03-27 DIAGNOSIS — I5189 Other ill-defined heart diseases: Secondary | ICD-10-CM | POA: Insufficient documentation

## 2024-03-27 DIAGNOSIS — I10 Essential (primary) hypertension: Secondary | ICD-10-CM | POA: Insufficient documentation

## 2024-03-27 LAB — NM PET CT CARDIAC PERFUSION MULTI W/ABSOLUTE BLOODFLOW
LV dias vol: 91 mL (ref 46–106)
LV sys vol: 37 mL (ref 3.8–5.2)
MBFR: 1.52
Nuc Rest EF: 59 %
Nuc Stress EF: 63 %
Rest MBF: 0.92 ml/g/min
Rest Nuclear Isotope Dose: 21.3 mCi
ST Depression (mm): 0 mm
Stress MBF: 1.4 ml/g/min
Stress Nuclear Isotope Dose: 21.4 mCi
TID: 1.77

## 2024-03-27 MED ORDER — REGADENOSON 0.4 MG/5ML IV SOLN
0.4000 mg | Freq: Once | INTRAVENOUS | Status: AC
  Administered 2024-03-27: 0.4 mg via INTRAVENOUS

## 2024-03-27 MED ORDER — RUBIDIUM RB82 GENERATOR (RUBYFILL)
21.3000 | PACK | Freq: Once | INTRAVENOUS | Status: AC
  Administered 2024-03-27: 21.3 via INTRAVENOUS

## 2024-03-27 MED ORDER — REGADENOSON 0.4 MG/5ML IV SOLN
INTRAVENOUS | Status: AC
  Filled 2024-03-27: qty 5

## 2024-03-27 MED ORDER — RUBIDIUM RB82 GENERATOR (RUBYFILL)
21.3000 | PACK | Freq: Once | INTRAVENOUS | Status: AC
  Administered 2024-03-27: 21.4 via INTRAVENOUS

## 2024-03-27 NOTE — Progress Notes (Signed)
 Pt. Tolerated lexi scan well.

## 2024-03-27 NOTE — Progress Notes (Unsigned)
 Cardiology Office Note    Date:  03/28/2024  ID:  Becky Lawson, DOB 19-Apr-1951, MRN [id number] PCP:  Becky Reyes SAUNDERS, MD  Cardiologist:  Becky Lesches, MD  Electrophysiologist:  None   Chief Complaint: Chest pain  History of Present Illness: .    Becky Lawson is a 73 y.o. female with visit-pertinent history of prediabetes, hypertension, hyperlipidemia, prior tobacco use.  First evaluated on 09/28/2023 by Dr. Alvan for evaluation of chest pain.  At office visit patient reported that she had had intermittent chest pain for the prior 3 weeks, pain described as a sudden, heavy pressure in the middle chest that sometimes radiated to the chin.  Coronary CTA on 10/12/2023 indicated at least moderate stenosis, coronary calcium  score of 3084, 99th percentile for age, sex and race matched controls.  Total plaque volume 1268 mm3, 96 percentile for age and sex matched control.  CT FFR analysis did not show any significant focal stenosis, there was gradual decrease in FFR across LCx system, FFR drops when the vessel becomes smaller in caliber after OM2 but there is no discrete point of stenosis, proximal FFR 0.98, mid FFR after OM two 0.76, distal FFR of 0.7.  Echocardiogram on 11/08/2023 indicated LVEF of 65 to 70%, no RWMA, grade 1 diastolic dysfunction, RV size and function was normal, LA was moderately dilated, aortic valve sclerosis was evident without aortic valve stenosis.  Patient was seen in clinic on 12/25/23 by Dr. Lesches patient denied any chest pain however did report jaw pain which was nitrate responsive.  A cardiac PET was ordered to further evaluate.  PET stress on 03/27/2024 indicated abnormal LV perfusion with evidence of ischemia, no evidence of infarction.  There was a medium defect with moderate reduction in uptake present in the apical septal and apex locations that was reversible, there was normal wall motion in the defect area, consistent with ischemia, noted to have severe three-vessel  calcium , abnormal MBF T and 3 times daily suggesting three-vessel CAD and not just distal LAD ischemia.  Today patient presents for follow-up.  She reports that she had an episode of chest discomfort earlier today at the gym resolved with a single nitroglycerin . Patient has intermittent chest pain since last office visit, reports this typically resolves with rest or following a single sublingual nitroglycerin , notes only once has she had to take two sublingual nitroglycerin . She also endorses dyspnea with exertion such as when doing chores around the house. Reviewed patients PET stress and indication for cardiac catheterization, patient agrees to proceed.   ROS: .   Today she denies palpitations, melena, hematuria, hemoptysis, diaphoresis, weakness, presyncope, syncope, orthopnea, and PND.  All other systems are reviewed and otherwise negative. Studies Reviewed: [name]    EKG:  EKG is ordered today, personally reviewed, demonstrating  EKG Interpretation Date/Time:  Thursday March 28 2024 13:40:57 EDT Ventricular Rate:  72 PR Interval:  136 QRS Duration:  78 QT Interval:  354 QTC Calculation: 387 R Axis:   -2  Text Interpretation: Normal sinus rhythm Minimal voltage criteria for LVH, may be normal variant ( R in aVL ) When compared with ECG of 23-Jan-2024 14:00, No significant change was found Confirmed by Becky Lawson [phone]) on 03/28/2024 2:26:21 PM   CV Studies: Cardiac studies reviewed are outlined and summarized above. Otherwise please see EMR for full report. Cardiac Studies & Procedures   ______________________________________________________________________________________________   STRESS TESTS  NM PET CT CARDIAC PERFUSION MULTI W/ABSOLUTE BLOODFLOW 03/27/2024  Narrative  LV perfusion is abnormal. There is evidence of ischemia. There is no evidence of infarction. Defect 1: There is a medium defect with moderate reduction in uptake present in the apical septal and apex location(s) that  is reversible. There is normal wall motion in the defect area. Consistent with ischemia.   Rest left ventricular function is normal. Rest EF: 59%. Stress left ventricular function is normal. Stress EF: 63%. End diastolic cavity size is normal. End systolic cavity size is normal.   Myocardial blood flow was computed to be 0.66ml/g/min at rest and 1.40ml/g/min at stress. Global myocardial blood flow reserve was 1.52 and was abnormal.   Coronary calcium  was present on the attenuation correction CT images. Severe coronary calcifications were present. Coronary calcifications were present in the left anterior descending artery, left circumflex artery and right coronary artery distribution(s).   Findings are consistent with ischemia. The study is intermediate risk.   Severely reduced MBFR in area of ischemia 0.88   Severe 3 vessel calcium  , abnormal MBFT and TID suggests 3 vessel CAD and not just distal LAD ischemia  EXAM: OVER-READ INTERPRETATION  PET-CT CHEST  The following report is an over-read performed by radiologist Dr. Camellia Lawson [facility] on 03/27/2024. This over-read does not include interpretation of cardiac or coronary anatomy or pathology. The cardiac PET and cardiac CT interpretation by the cardiologist is to be attached.  COMPARISON:  None.  FINDINGS: No evidence for lymphadenopathy within the visualized mediastinum or hilar regions.  The visualized lung parenchyma shows no suspicious pulmonary nodule or mass. No focal airspace consolidation. No effusion.  Visualized portions of the upper abdomen are unremarkable.  No suspicious lytic or sclerotic osseous abnormality.  IMPRESSION: No acute or clinically significant extracardiac findings.   Electronically Signed By: Becky Lawson M.D. On: 03/27/2024 09:25   ECHOCARDIOGRAM  ECHOCARDIOGRAM COMPLETE 11/08/2023  Narrative ECHOCARDIOGRAM REPORT    Patient Name:   Becky Lawson  Date of Exam:  11/08/2023 Medical Rec #:  [id number]     Height:       64.5 in Accession #:    [id number]    Weight:       200.0 lb Date of Birth:  1950-11-27     BSA:          1.967 m Patient Age:    72 years      BP:           138/78 mmHg Patient Gender: F             HR:           108 bpm. Exam Location:  [address]  Procedure: 2D Echo, Cardiac Doppler and Color Doppler (Both Spectral and Color Flow Doppler were utilized during procedure).  Indications:    R93.1 CAD  History:        Patient has prior history of Echocardiogram examinations, most recent 06/21/2022. CAD, Signs/Symptoms:Murmur and Chest Pain; Risk Factors:Hypertension, Dyslipidemia and Former Smoker.  Sonographer:    Elsie Bohr RDCS Referring Phys: [phone] MARY E BRANCH  IMPRESSIONS   1. Left ventricular ejection fraction, by estimation, is 65 to 70%. The left ventricle has normal function. The left ventricle has no regional wall motion abnormalities. Left ventricular diastolic parameters are consistent with Grade I diastolic dysfunction (impaired relaxation). 2. Right ventricular systolic function is normal. The right ventricular size is normal. 3. Left atrial size was moderately dilated. 4. The mitral valve is normal in structure. Mild mitral valve regurgitation. No  evidence of mitral stenosis. 5. The aortic valve is normal in structure. There is mild calcification of the aortic valve. Aortic valve regurgitation is not visualized. Aortic valve sclerosis/calcification is present, without any evidence of aortic stenosis. 6. There is high flow/turbulence over the pulmonic valve but no evidence of stenosis. 7. The inferior vena cava is normal in size with greater than 50% respiratory variability, suggesting right atrial pressure of 3 mmHg.  FINDINGS Left Ventricle: Left ventricular ejection fraction, by estimation, is 65 to 70%. The left ventricle has normal function. The left ventricle has no regional wall motion abnormalities.  The left ventricular internal cavity size was normal in size. There is no left ventricular hypertrophy. Left ventricular diastolic parameters are consistent with Grade I diastolic dysfunction (impaired relaxation).  Right Ventricle: The right ventricular size is normal. No increase in right ventricular wall thickness. Right ventricular systolic function is normal.  Left Atrium: Left atrial size was moderately dilated.  Right Atrium: Right atrial size was normal in size.  Pericardium: There is no evidence of pericardial effusion.  Mitral Valve: The mitral valve is normal in structure. Mild mitral annular calcification. Mild mitral valve regurgitation. No evidence of mitral valve stenosis.  Tricuspid Valve: The tricuspid valve is normal in structure. Tricuspid valve regurgitation is mild . No evidence of tricuspid stenosis.  Aortic Valve: The aortic valve is normal in structure. There is mild calcification of the aortic valve. Aortic valve regurgitation is not visualized. Aortic valve sclerosis/calcification is present, without any evidence of aortic stenosis.  Pulmonic Valve: The pulmonic valve was normal in structure. Pulmonic valve regurgitation is not visualized. No evidence of pulmonic stenosis.  Aorta: The aortic root is normal in size and structure.  Pulmonary Artery: There is high flow/turbulence over the pulmonic valve but no evidence of stenosis.  Venous: The inferior vena cava is normal in size with greater than 50% respiratory variability, suggesting right atrial pressure of 3 mmHg.  IAS/Shunts: No atrial level shunt detected by color flow Doppler.   LEFT VENTRICLE PLAX 2D LVIDd:         3.60 cm   Diastology LVIDs:         2.50 cm   LV e' medial:    9.25 cm/s LV PW:         1.20 cm   LV E/e' medial:  11.4 LV IVS:        1.50 cm   LV e' lateral:   17.30 cm/s LVOT diam:     2.10 cm   LV E/e' lateral: 6.1 LV SV:         90 LV SV Index:   46 LVOT Area:     3.46  cm   RIGHT VENTRICLE             IVC RV S prime:     19.20 cm/s  IVC diam: 0.80 cm TAPSE (M-mode): 1.9 cm RVSP:           26.2 mmHg  LEFT ATRIUM             Index        RIGHT ATRIUM           Index LA diam:        4.10 cm 2.08 cm/m   RA Pressure: 3.00 mmHg LA Vol (A2C):   69.8 ml 35.49 ml/m  RA Area:     13.60 cm LA Vol (A4C):   78.3 ml 39.81 ml/m  RA Volume:   32.80  ml  16.68 ml/m LA Biplane Vol: 79.7 ml 40.52 ml/m AORTIC VALVE LVOT Vmax:   136.00 cm/s LVOT Vmean:  94.600 cm/s LVOT VTI:    0.260 m  AORTA Ao Root diam: 3.30 cm Ao Asc diam:  3.50 cm  MITRAL VALVE                TRICUSPID VALVE MV Area (PHT): 2.87 cm     TR Peak grad:   23.2 mmHg MV Decel Time: 264 msec     TR Vmax:        241.00 cm/s MV E velocity: 105.00 cm/s  Estimated RAP:  3.00 mmHg MV A velocity: 134.00 cm/s  RVSP:           26.2 mmHg MV E/A ratio:  0.78 SHUNTS Systemic VTI:  0.26 m Systemic Diam: 2.10 cm  Toribio Fuel MD Electronically signed by Toribio Fuel MD Signature Date/Time: 11/08/2023/3:36:55 PM    Final      CT SCANS  CT CORONARY MORPH W/CTA COR W/SCORE 10/12/2023  Addendum 10/26/2023  9:16 PM ADDENDUM REPORT: 10/26/2023 21:13  EXAM: OVER-READ INTERPRETATION  CT CHEST  The following report is an over-read performed by radiologist Dr. Fonda Mom [facility] on 10/26/2023. This over-read does not include interpretation of cardiac or coronary anatomy or pathology. The coronary CTA interpretation by the cardiologist is attached.  COMPARISON:  None.  FINDINGS: Cardiovascular:  See findings discussed in the body of the report.  Mediastinum/Nodes: No suspicious adenopathy identified. Imaged mediastinal structures are unremarkable.  Lungs/Pleura: There is dependent basilar subsegmental atelectasis. No pneumonia or pulmonary edema. No pleural effusion or pneumothorax.  Upper Abdomen: No acute abnormality.  Musculoskeletal: No chest wall  abnormality. No acute osseous findings. There are thoracic degenerative changes.  IMPRESSION:  No acute extracardiac incidental findings.   Electronically Signed By: Fonda Field M.D. On: 10/26/2023 21:13  Narrative HISTORY: Chest pain/anginal equiv, intermediate CAD risk, treadmill candidate  EXAM: Cardiac/Coronary CT  TECHNIQUE: The patient was scanned on a [employer].  PROTOCOL: A 120 kV prospective scan was triggered in the descending thoracic aorta at 111 HU's. Axial non-contrast 3 mm slices were carried out through the heart. The data set was analyzed on a dedicated work station and scored using the Agatston method. Gantry rotation speed was 250 msecs and collimation was 0.6 mm. Heart rate was optimized medically and sl NTG was given. The 3D data set was reconstructed in 5% intervals of the 35-75 % of the R-R cycle. Systolic and diastolic phases were analyzed on a dedicated work station using MPR, MIP and VRT modes. The patient received 95mL OMNIPAQUE  IOHEXOL  350 MG/ML SOLN of contrast.  FINDINGS: Coronary calcium  score: The patient's coronary artery calcium  score is 3084, which places the patient in the 99th percentile.  Coronary arteries: Normal coronary origins.  Co- dominance.  Right Coronary Artery: Normal caliber vessel, gives rise to right PDA. Diffuse mixed calcified and noncalcified plaque throughout primarily proximal vessel. Maximum stenosis 25-49% proximal, 1-24% mid, and 1-24% distal.  Left Main Coronary Artery: Normal caliber vessel. No significant plaque or stenosis.  Left Anterior Descending Coronary Artery: Normal caliber vessel. Diffuse mixed calcified and noncalcified plaque throughout vessel. Maximum stenosis 25-49% proximal, 50-69% mid, and 50-69% distal. Gives rise to two small diagonal branches.  Left Circumflex Artery: Normal caliber vessel, gives rise to left PDA. Diffuse mixed calcified and noncalcified plaque  throughout vessel. Maximum stenosis 25-49% proximal, 25-49% mid, and 50-69% distal. Gives rise to small  first, large second, medium third OM branches.  Aorta: Normal size, 35 mm at the mid ascending aorta (level of the PA bifurcation) measured double oblique. Aortic atherosclerosis. No dissection seen in visualized portions of the aorta.  Aortic Valve: Mild calcifications. Trileaflet.  Other findings:  Normal pulmonary vein drainage into the left atrium.  Normal left atrial appendage without a thrombus.  Normal size of the pulmonary artery.  Normal appearance of the pericardium.  IMPRESSION: 1. CAD with at least moderate stenosis, CADRADS = 3. CT FFR will be performed and reported separately. Heavy calcification limits interpretation in certain segments.  2. Coronary calcium  score of 3084. This was 99th percentile for age-, sex-, and race- matched controls.  3. Total plaque volume 1268 mm3 which is 96th percentile for age- and sex- matched controls (calcified plaque 460 mm3; noncalcified plaque 808 mm3). Total plaque volume is extensive.  4. Normal coronary origin with co- dominance.  INTERPRETATION:  CAD-RADS 3: Moderate stenosis (50-69%). Consider symptom-guided anti-ischemic pharmacotherapy as well as risk factor modification per guideline directed care. Additional analysis with CT FFR will be submitted.  Electronically Signed: By: Shelda Bruckner M.D. On: 10/13/2023 14:14     ______________________________________________________________________________________________       Current Reported Medications:.    Current Meds  Medication Sig   acetaminophen  (TYLENOL ) 500 MG tablet Take 2 tablets (1,000 mg total) by mouth every 6 (six) hours.   ALPRAZolam  (XANAX ) 1 MG tablet TAKE 1/2 TO 1 TABLET BY MOUTH twice daily AS NEEDED FOR ANXIETY   amLODipine  (NORVASC ) 10 MG tablet Take 1 tablet (10 mg total) by mouth daily.   aspirin  EC 81 MG tablet Take 1  tablet (81 mg total) by mouth daily. Swallow whole.   atenolol  (TENORMIN ) 25 MG tablet Take 1 tablet (25 mg total) by mouth daily.   atorvastatin  (LIPITOR) 80 MG tablet Take 1 tablet (80 mg total) by mouth daily.   cholestyramine (QUESTRAN) 4 GM/DOSE powder 4 g orally 2 times a day for 30 day(s)   dextroamphetamine  (DEXTROSTAT ) 10 MG tablet Take 1 tablet (10 mg total) by mouth every morning.   docusate sodium  (COLACE) 100 MG capsule Take 1 capsule (100 mg total) by mouth 2 (two) times daily.   hydrOXYzine  (ATARAX ) 25 MG tablet TAKE ONE TABLET BY MOUTH every EIGHT hours AS NEEDED   lamoTRIgine  (LAMICTAL ) 100 MG tablet TAKE TWO TABLETS BY MOUTH EVERY MORNING and TAKE ONE TABLET BY MOUTH EVERY EVENING   levothyroxine  (SYNTHROID ) 100 MCG tablet TAKE 1 TABLET BY MOUTH DAILY BEFORE BREAKFAST   losartan  (COZAAR ) 50 MG tablet TAKE 1 TABLET BY MOUTH DAILY   Na Sulfate-K Sulfate-Mg Sulf (SUPREP BOWEL PREP KIT) 17.5-3.13-1.6 GM/177ML SOLN Take 1 kit by mouth as directed. For colonoscopy prep   nitroGLYCERIN  (NITROSTAT ) 0.4 MG SL tablet Place 1 tablet (0.4 mg total) under the tongue every 5 (five) minutes as needed for chest pain.   omeprazole  (PRILOSEC) 40 MG capsule Take 1 capsule (40 mg total) by mouth 2 (two) times daily.   oxyCODONE -acetaminophen  (PERCOCET) 10-325 MG tablet Take 1 tablet by mouth every 4 (four) hours as needed for pain.   polyethylene glycol (MIRALAX  / GLYCOLAX ) 17 g packet Take 17 g by mouth daily as needed for mild constipation.   QUEtiapine  (SEROQUEL ) 50 MG tablet Take 1 tablet (50 mg total) by mouth daily.   traZODone  (DESYREL ) 150 MG tablet TAKE TWO TABLETS BY MOUTH everyday AT bedtime   Vitamin D , Ergocalciferol , (DRISDOL ) 1.25 MG (50000 UNIT) CAPS  capsule Take 1 capsule (50,000 Units total) by mouth every 7 (seven) days. Sunday   Current Facility-Administered Medications for the 03/28/24 encounter (Office Visit) with Anais Denslow D, NP  Medication   cyanocobalamin  ((VITAMIN  B-12)) injection 1,000 mcg    Physical Exam:    VS:  BP 116/72   Pulse 72   Ht 5' 4.5 (1.638 m)   Wt 178 lb 6.4 oz (80.9 kg)   SpO2 95%   BMI 30.15 kg/m    Wt Readings from Last 3 Encounters:  03/28/24 178 lb 6.4 oz (80.9 kg)  03/19/24 183 lb (83 kg)  01/18/24 194 lb (88 kg)    GEN: Well nourished, well developed in no acute distress NECK: No JVD; No carotid bruits CARDIAC: RRR, no murmurs, rubs, gallops RESPIRATORY:  Clear to auscultation without rales, wheezing or rhonchi  ABDOMEN: Soft, non-tender, non-distended EXTREMITIES:  No edema; No acute deformity     Asessement and Plan:.    CAD: Coronary CTA on 10/12/2023 indicated at least moderate stenosis, coronary calcium  score of 3084, 99th percentile for age, sex and race matched controls.  Total plaque volume 1268 mm3, 96 percentile for age and sex matched control.  CT FFR analysis did not show any significant focal stenosis, there was gradual decrease in FFR across LCx system, FFR drops when the vessel becomes smaller in caliber after OM2 but there is no discrete point of stenosis, proximal FFR 0.98, mid FFR after OM two 0.76, distal FFR of 0.7.  Echocardiogram on 11/08/2023 indicated LVEF of 65 to 70%, no RWMA.  PET stress on 03/27/2024 indicated abnormal LV perfusion with evidence of ischemia, no evidence of infarction, medium defect with moderate reduction in uptake present in the apical septal and apex locations that was reversible, normal wall motion in the defect area consistent with ischemia. Today she continues to endorse intermittent chest pain, typically resolves with rest or sublingual nitroglycerin . Discussed case with Dr. Wonda, he is in agreement with plan for cardiac catheterization. Reviewed with patient, she is also in agreement, please see consent below. Check CBC, BMET and TSH today. Continue aspirin  81 mg daily, reviewed ED precautions.  Informed Consent   Shared Decision Making/Informed Consent The risks [stroke (1  in 1000), death (1 in 1000), kidney failure [usually temporary] (1 in 500), bleeding (1 in 200), allergic reaction [possibly serious] (1 in 200)], benefits (diagnostic support and management of coronary artery disease) and alternatives of a cardiac catheterization were discussed in detail with Ms. Mahan and she is willing to proceed.     Hyperlipidemia: Last lipid profile on 05/12/2023 indicated total cholesterol 149, HDL 50, glycerides 124 and LDL 73.  Continue atorvastatin  80 mg daily. Will plan to check LFTs and fasting lipid profile on follow up.   Hypertension: Blood pressure today 116/72. Continue current antihypertensive regimen.     Disposition: F/u with Terre Zabriskie, NP in three weeks.   Signed, Poseidon Pam D Keeanna Villafranca, NP

## 2024-03-27 NOTE — H&P (View-Only) (Signed)
 Cardiology Office Note    Date:  03/28/2024  ID:  [name], DOB 19-Apr-1951, MRN [id number] PCP:  Levora Reyes SAUNDERS, MD  Cardiologist:  Dorn Lesches, MD  Electrophysiologist:  None   Chief Complaint: Chest pain  History of Present Illness: .    Becky Lawson is a 73 y.o. female with visit-pertinent history of prediabetes, hypertension, hyperlipidemia, prior tobacco use.  First evaluated on 09/28/2023 by Dr. Alvan for evaluation of chest pain.  At office visit patient reported that she had had intermittent chest pain for the prior 3 weeks, pain described as a sudden, heavy pressure in the middle chest that sometimes radiated to the chin.  Coronary CTA on 10/12/2023 indicated at least moderate stenosis, coronary calcium  score of 3084, 99th percentile for age, sex and race matched controls.  Total plaque volume 1268 mm3, 96 percentile for age and sex matched control.  CT FFR analysis did not show any significant focal stenosis, there was gradual decrease in FFR across LCx system, FFR drops when the vessel becomes smaller in caliber after OM2 but there is no discrete point of stenosis, proximal FFR 0.98, mid FFR after OM two 0.76, distal FFR of 0.7.  Echocardiogram on 11/08/2023 indicated LVEF of 65 to 70%, no RWMA, grade 1 diastolic dysfunction, RV size and function was normal, LA was moderately dilated, aortic valve sclerosis was evident without aortic valve stenosis.  Patient was seen in clinic on 12/25/23 by Dr. Lesches patient denied any chest pain however did report jaw pain which was nitrate responsive.  A cardiac PET was ordered to further evaluate.  PET stress on 03/27/2024 indicated abnormal LV perfusion with evidence of ischemia, no evidence of infarction.  There was a medium defect with moderate reduction in uptake present in the apical septal and apex locations that was reversible, there was normal wall motion in the defect area, consistent with ischemia, noted to have severe three-vessel  calcium , abnormal MBF T and 3 times daily suggesting three-vessel CAD and not just distal LAD ischemia.  Today patient presents for follow-up.  She reports that she had an episode of chest discomfort earlier today at the gym resolved with a single nitroglycerin . Patient has intermittent chest pain since last office visit, reports this typically resolves with rest or following a single sublingual nitroglycerin , notes only once has she had to take two sublingual nitroglycerin . She also endorses dyspnea with exertion such as when doing chores around the house. Reviewed patients PET stress and indication for cardiac catheterization, patient agrees to proceed.   ROS: .   Today she denies palpitations, melena, hematuria, hemoptysis, diaphoresis, weakness, presyncope, syncope, orthopnea, and PND.  All other systems are reviewed and otherwise negative. Studies Reviewed: [name]    EKG:  EKG is ordered today, personally reviewed, demonstrating  EKG Interpretation Date/Time:  Thursday March 28 2024 13:40:57 EDT Ventricular Rate:  72 PR Interval:  136 QRS Duration:  78 QT Interval:  354 QTC Calculation: 387 R Axis:   -2  Text Interpretation: Normal sinus rhythm Minimal voltage criteria for LVH, may be normal variant ( R in aVL ) When compared with ECG of 23-Jan-2024 14:00, No significant change was found Confirmed by Montia Haslip [phone]) on 03/28/2024 2:26:21 PM   CV Studies: Cardiac studies reviewed are outlined and summarized above. Otherwise please see EMR for full report. Cardiac Studies & Procedures   ______________________________________________________________________________________________   STRESS TESTS  NM PET CT CARDIAC PERFUSION MULTI W/ABSOLUTE BLOODFLOW 03/27/2024  Narrative  LV perfusion is abnormal. There is evidence of ischemia. There is no evidence of infarction. Defect 1: There is a medium defect with moderate reduction in uptake present in the apical septal and apex location(s) that  is reversible. There is normal wall motion in the defect area. Consistent with ischemia.   Rest left ventricular function is normal. Rest EF: 59%. Stress left ventricular function is normal. Stress EF: 63%. End diastolic cavity size is normal. End systolic cavity size is normal.   Myocardial blood flow was computed to be 0.66ml/g/min at rest and 1.40ml/g/min at stress. Global myocardial blood flow reserve was 1.52 and was abnormal.   Coronary calcium  was present on the attenuation correction CT images. Severe coronary calcifications were present. Coronary calcifications were present in the left anterior descending artery, left circumflex artery and right coronary artery distribution(s).   Findings are consistent with ischemia. The study is intermediate risk.   Severely reduced MBFR in area of ischemia 0.88   Severe 3 vessel calcium  , abnormal MBFT and TID suggests 3 vessel CAD and not just distal LAD ischemia  EXAM: OVER-READ INTERPRETATION  PET-CT CHEST  The following report is an over-read performed by radiologist Dr. Camellia Lang [facility] on 03/27/2024. This over-read does not include interpretation of cardiac or coronary anatomy or pathology. The cardiac PET and cardiac CT interpretation by the cardiologist is to be attached.  COMPARISON:  None.  FINDINGS: No evidence for lymphadenopathy within the visualized mediastinum or hilar regions.  The visualized lung parenchyma shows no suspicious pulmonary nodule or mass. No focal airspace consolidation. No effusion.  Visualized portions of the upper abdomen are unremarkable.  No suspicious lytic or sclerotic osseous abnormality.  IMPRESSION: No acute or clinically significant extracardiac findings.   Electronically Signed By: Camellia Candle M.D. On: 03/27/2024 09:25   ECHOCARDIOGRAM  ECHOCARDIOGRAM COMPLETE 11/08/2023  Narrative ECHOCARDIOGRAM REPORT    Patient Name:   Becky Lawson  Date of Exam:  11/08/2023 Medical Rec #:  [id number]     Height:       64.5 in Accession #:    [id number]    Weight:       200.0 lb Date of Birth:  1950-11-27     BSA:          1.967 m Patient Age:    72 years      BP:           138/78 mmHg Patient Gender: F             HR:           108 bpm. Exam Location:  [address]  Procedure: 2D Echo, Cardiac Doppler and Color Doppler (Both Spectral and Color Flow Doppler were utilized during procedure).  Indications:    R93.1 CAD  History:        Patient has prior history of Echocardiogram examinations, most recent 06/21/2022. CAD, Signs/Symptoms:Murmur and Chest Pain; Risk Factors:Hypertension, Dyslipidemia and Former Smoker.  Sonographer:    Elsie Bohr RDCS Referring Phys: [phone] MARY E BRANCH  IMPRESSIONS   1. Left ventricular ejection fraction, by estimation, is 65 to 70%. The left ventricle has normal function. The left ventricle has no regional wall motion abnormalities. Left ventricular diastolic parameters are consistent with Grade I diastolic dysfunction (impaired relaxation). 2. Right ventricular systolic function is normal. The right ventricular size is normal. 3. Left atrial size was moderately dilated. 4. The mitral valve is normal in structure. Mild mitral valve regurgitation. No  evidence of mitral stenosis. 5. The aortic valve is normal in structure. There is mild calcification of the aortic valve. Aortic valve regurgitation is not visualized. Aortic valve sclerosis/calcification is present, without any evidence of aortic stenosis. 6. There is high flow/turbulence over the pulmonic valve but no evidence of stenosis. 7. The inferior vena cava is normal in size with greater than 50% respiratory variability, suggesting right atrial pressure of 3 mmHg.  FINDINGS Left Ventricle: Left ventricular ejection fraction, by estimation, is 65 to 70%. The left ventricle has normal function. The left ventricle has no regional wall motion abnormalities.  The left ventricular internal cavity size was normal in size. There is no left ventricular hypertrophy. Left ventricular diastolic parameters are consistent with Grade I diastolic dysfunction (impaired relaxation).  Right Ventricle: The right ventricular size is normal. No increase in right ventricular wall thickness. Right ventricular systolic function is normal.  Left Atrium: Left atrial size was moderately dilated.  Right Atrium: Right atrial size was normal in size.  Pericardium: There is no evidence of pericardial effusion.  Mitral Valve: The mitral valve is normal in structure. Mild mitral annular calcification. Mild mitral valve regurgitation. No evidence of mitral valve stenosis.  Tricuspid Valve: The tricuspid valve is normal in structure. Tricuspid valve regurgitation is mild . No evidence of tricuspid stenosis.  Aortic Valve: The aortic valve is normal in structure. There is mild calcification of the aortic valve. Aortic valve regurgitation is not visualized. Aortic valve sclerosis/calcification is present, without any evidence of aortic stenosis.  Pulmonic Valve: The pulmonic valve was normal in structure. Pulmonic valve regurgitation is not visualized. No evidence of pulmonic stenosis.  Aorta: The aortic root is normal in size and structure.  Pulmonary Artery: There is high flow/turbulence over the pulmonic valve but no evidence of stenosis.  Venous: The inferior vena cava is normal in size with greater than 50% respiratory variability, suggesting right atrial pressure of 3 mmHg.  IAS/Shunts: No atrial level shunt detected by color flow Doppler.   LEFT VENTRICLE PLAX 2D LVIDd:         3.60 cm   Diastology LVIDs:         2.50 cm   LV e' medial:    9.25 cm/s LV PW:         1.20 cm   LV E/e' medial:  11.4 LV IVS:        1.50 cm   LV e' lateral:   17.30 cm/s LVOT diam:     2.10 cm   LV E/e' lateral: 6.1 LV SV:         90 LV SV Index:   46 LVOT Area:     3.46  cm   RIGHT VENTRICLE             IVC RV S prime:     19.20 cm/s  IVC diam: 0.80 cm TAPSE (M-mode): 1.9 cm RVSP:           26.2 mmHg  LEFT ATRIUM             Index        RIGHT ATRIUM           Index LA diam:        4.10 cm 2.08 cm/m   RA Pressure: 3.00 mmHg LA Vol (A2C):   69.8 ml 35.49 ml/m  RA Area:     13.60 cm LA Vol (A4C):   78.3 ml 39.81 ml/m  RA Volume:   32.80  ml  16.68 ml/m LA Biplane Vol: 79.7 ml 40.52 ml/m AORTIC VALVE LVOT Vmax:   136.00 cm/s LVOT Vmean:  94.600 cm/s LVOT VTI:    0.260 m  AORTA Ao Root diam: 3.30 cm Ao Asc diam:  3.50 cm  MITRAL VALVE                TRICUSPID VALVE MV Area (PHT): 2.87 cm     TR Peak grad:   23.2 mmHg MV Decel Time: 264 msec     TR Vmax:        241.00 cm/s MV E velocity: 105.00 cm/s  Estimated RAP:  3.00 mmHg MV A velocity: 134.00 cm/s  RVSP:           26.2 mmHg MV E/A ratio:  0.78 SHUNTS Systemic VTI:  0.26 m Systemic Diam: 2.10 cm  Toribio Fuel MD Electronically signed by Toribio Fuel MD Signature Date/Time: 11/08/2023/3:36:55 PM    Final      CT SCANS  CT CORONARY MORPH W/CTA COR W/SCORE 10/12/2023  Addendum 10/26/2023  9:16 PM ADDENDUM REPORT: 10/26/2023 21:13  EXAM: OVER-READ INTERPRETATION  CT CHEST  The following report is an over-read performed by radiologist Dr. Fonda Mom [facility] on 10/26/2023. This over-read does not include interpretation of cardiac or coronary anatomy or pathology. The coronary CTA interpretation by the cardiologist is attached.  COMPARISON:  None.  FINDINGS: Cardiovascular:  See findings discussed in the body of the report.  Mediastinum/Nodes: No suspicious adenopathy identified. Imaged mediastinal structures are unremarkable.  Lungs/Pleura: There is dependent basilar subsegmental atelectasis. No pneumonia or pulmonary edema. No pleural effusion or pneumothorax.  Upper Abdomen: No acute abnormality.  Musculoskeletal: No chest wall  abnormality. No acute osseous findings. There are thoracic degenerative changes.  IMPRESSION:  No acute extracardiac incidental findings.   Electronically Signed By: Fonda Field M.D. On: 10/26/2023 21:13  Narrative HISTORY: Chest pain/anginal equiv, intermediate CAD risk, treadmill candidate  EXAM: Cardiac/Coronary CT  TECHNIQUE: The patient was scanned on a [employer].  PROTOCOL: A 120 kV prospective scan was triggered in the descending thoracic aorta at 111 HU's. Axial non-contrast 3 mm slices were carried out through the heart. The data set was analyzed on a dedicated work station and scored using the Agatston method. Gantry rotation speed was 250 msecs and collimation was 0.6 mm. Heart rate was optimized medically and sl NTG was given. The 3D data set was reconstructed in 5% intervals of the 35-75 % of the R-R cycle. Systolic and diastolic phases were analyzed on a dedicated work station using MPR, MIP and VRT modes. The patient received 95mL OMNIPAQUE  IOHEXOL  350 MG/ML SOLN of contrast.  FINDINGS: Coronary calcium  score: The patient's coronary artery calcium  score is 3084, which places the patient in the 99th percentile.  Coronary arteries: Normal coronary origins.  Co- dominance.  Right Coronary Artery: Normal caliber vessel, gives rise to right PDA. Diffuse mixed calcified and noncalcified plaque throughout primarily proximal vessel. Maximum stenosis 25-49% proximal, 1-24% mid, and 1-24% distal.  Left Main Coronary Artery: Normal caliber vessel. No significant plaque or stenosis.  Left Anterior Descending Coronary Artery: Normal caliber vessel. Diffuse mixed calcified and noncalcified plaque throughout vessel. Maximum stenosis 25-49% proximal, 50-69% mid, and 50-69% distal. Gives rise to two small diagonal branches.  Left Circumflex Artery: Normal caliber vessel, gives rise to left PDA. Diffuse mixed calcified and noncalcified plaque  throughout vessel. Maximum stenosis 25-49% proximal, 25-49% mid, and 50-69% distal. Gives rise to small  first, large second, medium third OM branches.  Aorta: Normal size, 35 mm at the mid ascending aorta (level of the PA bifurcation) measured double oblique. Aortic atherosclerosis. No dissection seen in visualized portions of the aorta.  Aortic Valve: Mild calcifications. Trileaflet.  Other findings:  Normal pulmonary vein drainage into the left atrium.  Normal left atrial appendage without a thrombus.  Normal size of the pulmonary artery.  Normal appearance of the pericardium.  IMPRESSION: 1. CAD with at least moderate stenosis, CADRADS = 3. CT FFR will be performed and reported separately. Heavy calcification limits interpretation in certain segments.  2. Coronary calcium  score of 3084. This was 99th percentile for age-, sex-, and race- matched controls.  3. Total plaque volume 1268 mm3 which is 96th percentile for age- and sex- matched controls (calcified plaque 460 mm3; noncalcified plaque 808 mm3). Total plaque volume is extensive.  4. Normal coronary origin with co- dominance.  INTERPRETATION:  CAD-RADS 3: Moderate stenosis (50-69%). Consider symptom-guided anti-ischemic pharmacotherapy as well as risk factor modification per guideline directed care. Additional analysis with CT FFR will be submitted.  Electronically Signed: By: Shelda Bruckner M.D. On: 10/13/2023 14:14     ______________________________________________________________________________________________       Current Reported Medications:.    Current Meds  Medication Sig   acetaminophen  (TYLENOL ) 500 MG tablet Take 2 tablets (1,000 mg total) by mouth every 6 (six) hours.   ALPRAZolam  (XANAX ) 1 MG tablet TAKE 1/2 TO 1 TABLET BY MOUTH twice daily AS NEEDED FOR ANXIETY   amLODipine  (NORVASC ) 10 MG tablet Take 1 tablet (10 mg total) by mouth daily.   aspirin  EC 81 MG tablet Take 1  tablet (81 mg total) by mouth daily. Swallow whole.   atenolol  (TENORMIN ) 25 MG tablet Take 1 tablet (25 mg total) by mouth daily.   atorvastatin  (LIPITOR) 80 MG tablet Take 1 tablet (80 mg total) by mouth daily.   cholestyramine (QUESTRAN) 4 GM/DOSE powder 4 g orally 2 times a day for 30 day(s)   dextroamphetamine  (DEXTROSTAT ) 10 MG tablet Take 1 tablet (10 mg total) by mouth every morning.   docusate sodium  (COLACE) 100 MG capsule Take 1 capsule (100 mg total) by mouth 2 (two) times daily.   hydrOXYzine  (ATARAX ) 25 MG tablet TAKE ONE TABLET BY MOUTH every EIGHT hours AS NEEDED   lamoTRIgine  (LAMICTAL ) 100 MG tablet TAKE TWO TABLETS BY MOUTH EVERY MORNING and TAKE ONE TABLET BY MOUTH EVERY EVENING   levothyroxine  (SYNTHROID ) 100 MCG tablet TAKE 1 TABLET BY MOUTH DAILY BEFORE BREAKFAST   losartan  (COZAAR ) 50 MG tablet TAKE 1 TABLET BY MOUTH DAILY   Na Sulfate-K Sulfate-Mg Sulf (SUPREP BOWEL PREP KIT) 17.5-3.13-1.6 GM/177ML SOLN Take 1 kit by mouth as directed. For colonoscopy prep   nitroGLYCERIN  (NITROSTAT ) 0.4 MG SL tablet Place 1 tablet (0.4 mg total) under the tongue every 5 (five) minutes as needed for chest pain.   omeprazole  (PRILOSEC) 40 MG capsule Take 1 capsule (40 mg total) by mouth 2 (two) times daily.   oxyCODONE -acetaminophen  (PERCOCET) 10-325 MG tablet Take 1 tablet by mouth every 4 (four) hours as needed for pain.   polyethylene glycol (MIRALAX  / GLYCOLAX ) 17 g packet Take 17 g by mouth daily as needed for mild constipation.   QUEtiapine  (SEROQUEL ) 50 MG tablet Take 1 tablet (50 mg total) by mouth daily.   traZODone  (DESYREL ) 150 MG tablet TAKE TWO TABLETS BY MOUTH everyday AT bedtime   Vitamin D , Ergocalciferol , (DRISDOL ) 1.25 MG (50000 UNIT) CAPS  capsule Take 1 capsule (50,000 Units total) by mouth every 7 (seven) days. Sunday   Current Facility-Administered Medications for the 03/28/24 encounter (Office Visit) with Anais Denslow D, NP  Medication   cyanocobalamin  ((VITAMIN  B-12)) injection 1,000 mcg    Physical Exam:    VS:  BP 116/72   Pulse 72   Ht 5' 4.5 (1.638 m)   Wt 178 lb 6.4 oz (80.9 kg)   SpO2 95%   BMI 30.15 kg/m    Wt Readings from Last 3 Encounters:  03/28/24 178 lb 6.4 oz (80.9 kg)  03/19/24 183 lb (83 kg)  01/18/24 194 lb (88 kg)    GEN: Well nourished, well developed in no acute distress NECK: No JVD; No carotid bruits CARDIAC: RRR, no murmurs, rubs, gallops RESPIRATORY:  Clear to auscultation without rales, wheezing or rhonchi  ABDOMEN: Soft, non-tender, non-distended EXTREMITIES:  No edema; No acute deformity     Asessement and Plan:.    CAD: Coronary CTA on 10/12/2023 indicated at least moderate stenosis, coronary calcium  score of 3084, 99th percentile for age, sex and race matched controls.  Total plaque volume 1268 mm3, 96 percentile for age and sex matched control.  CT FFR analysis did not show any significant focal stenosis, there was gradual decrease in FFR across LCx system, FFR drops when the vessel becomes smaller in caliber after OM2 but there is no discrete point of stenosis, proximal FFR 0.98, mid FFR after OM two 0.76, distal FFR of 0.7.  Echocardiogram on 11/08/2023 indicated LVEF of 65 to 70%, no RWMA.  PET stress on 03/27/2024 indicated abnormal LV perfusion with evidence of ischemia, no evidence of infarction, medium defect with moderate reduction in uptake present in the apical septal and apex locations that was reversible, normal wall motion in the defect area consistent with ischemia. Today she continues to endorse intermittent chest pain, typically resolves with rest or sublingual nitroglycerin . Discussed case with Dr. Wonda, he is in agreement with plan for cardiac catheterization. Reviewed with patient, she is also in agreement, please see consent below. Check CBC, BMET and TSH today. Continue aspirin  81 mg daily, reviewed ED precautions.  Informed Consent   Shared Decision Making/Informed Consent The risks [stroke (1  in 1000), death (1 in 1000), kidney failure [usually temporary] (1 in 500), bleeding (1 in 200), allergic reaction [possibly serious] (1 in 200)], benefits (diagnostic support and management of coronary artery disease) and alternatives of a cardiac catheterization were discussed in detail with Ms. Mahan and she is willing to proceed.     Hyperlipidemia: Last lipid profile on 05/12/2023 indicated total cholesterol 149, HDL 50, glycerides 124 and LDL 73.  Continue atorvastatin  80 mg daily. Will plan to check LFTs and fasting lipid profile on follow up.   Hypertension: Blood pressure today 116/72. Continue current antihypertensive regimen.     Disposition: F/u with Terre Zabriskie, NP in three weeks.   Signed, [place] Pam D Keeanna Villafranca, NP

## 2024-03-28 ENCOUNTER — Ambulatory Visit: Attending: Cardiology | Admitting: Cardiology

## 2024-03-28 ENCOUNTER — Encounter: Payer: Self-pay | Admitting: Cardiology

## 2024-03-28 VITALS — BP 116/72 | HR 72 | Ht 64.5 in | Wt 178.4 lb

## 2024-03-28 DIAGNOSIS — I25118 Atherosclerotic heart disease of native coronary artery with other forms of angina pectoris: Secondary | ICD-10-CM | POA: Diagnosis not present

## 2024-03-28 DIAGNOSIS — E782 Mixed hyperlipidemia: Secondary | ICD-10-CM

## 2024-03-28 DIAGNOSIS — I10 Essential (primary) hypertension: Secondary | ICD-10-CM

## 2024-03-28 DIAGNOSIS — R931 Abnormal findings on diagnostic imaging of heart and coronary circulation: Secondary | ICD-10-CM

## 2024-03-28 DIAGNOSIS — R079 Chest pain, unspecified: Secondary | ICD-10-CM

## 2024-03-28 DIAGNOSIS — R9439 Abnormal result of other cardiovascular function study: Secondary | ICD-10-CM | POA: Diagnosis not present

## 2024-03-28 NOTE — Patient Instructions (Signed)
 Medication Instructions:  No changes *If you need a refill on your cardiac medications before your next appointment, please call your pharmacy*  Lab Work: Today we are going to draw a CBC, Bmet, and TSH If you have labs (blood work) drawn today and your tests are completely normal, you will receive your results only by: MyChart Message (if you have MyChart) OR A paper copy in the mail If you have any lab test that is abnormal or we need to change your treatment, we will call you to review the results.  Testing/Procedures: Your physician has requested that you have a cardiac catheterization. Cardiac catheterization is used to diagnose and/or treat various heart conditions. Doctors may recommend this procedure for a number of different reasons. The most common reason is to evaluate chest pain. Chest pain can be a symptom of coronary artery disease (CAD), and cardiac catheterization can show whether plaque is narrowing or blocking your heart's arteries. This procedure is also used to evaluate the valves, as well as measure the blood flow and oxygen levels in different parts of your heart. For further information please visit [email or web address] Please follow instruction sheet, as given.   Follow-Up: At [facility], you and your health needs are our priority.  As part of our continuing mission to provide you with exceptional heart care, our providers are all part of one team.  This team includes your primary Cardiologist (physician) and Advanced Practice Providers or APPs (Physician Assistants and Nurse Practitioners) who all work together to provide you with the care you need, when you need it.  Your next appointment:   Already scheduled  Other Instructions       Cardiac/Peripheral Catheterization   You are scheduled for a Cardiac Catheterization on Wednesday, July 30 with Dr. Gordy Bergamo.  1. Please arrive at the [facility] (Main Entrance A) at [facility]: [address] [place], [place] [address] at 8:00 AM (This time is TWO hour(s) before your procedure to ensure your preparation).   Free valet parking service is available. You will check in at ADMITTING. The support person will be asked to wait in the waiting room.  It is OK to have someone drop you off and come back when you are ready to be discharged.        Special note: Every effort is made to have your procedure done on time. Please understand that emergencies sometimes delay scheduled procedures.  2. Diet: Do not eat solid foods after midnight.  You may have clear liquids until 5 AM the day of the procedure.  3. Labs: You will need to have blood drawn today.  You do not need to be fasting.  4. Medication instructions in preparation for your procedure:   Contrast Allergy: No  On the morning of your procedure, take Aspirin  81 mg and any morning medicines NOT listed above.  You may use sips of water .  5. Plan to go home the same day, you will only stay overnight if medically necessary. 6. You MUST have a responsible adult to drive you home. 7. An adult MUST be with you the first 24 hours after you arrive home. 8. Bring a current list of your medications, and the last time and date medication taken. 9. Bring ID and current insurance cards. 10.Please wear clothes that are easy to get on and off and wear slip-on shoes.  Thank you for allowing us  to care for you!   -- [facility]

## 2024-03-29 ENCOUNTER — Ambulatory Visit: Payer: Self-pay | Admitting: Cardiology

## 2024-03-29 LAB — CBC
Hematocrit: 39.3 % (ref 34.0–46.6)
Hemoglobin: 12.6 g/dL (ref 11.1–15.9)
MCH: 30.4 pg (ref 26.6–33.0)
MCHC: 32.1 g/dL (ref 31.5–35.7)
MCV: 95 fL (ref 79–97)
Platelets: 266 x10E3/uL (ref 150–450)
RBC: 4.14 x10E6/uL (ref 3.77–5.28)
RDW: 12.7 % (ref 11.7–15.4)
WBC: 7.1 x10E3/uL (ref 3.4–10.8)

## 2024-03-29 LAB — BASIC METABOLIC PANEL WITH GFR
BUN/Creatinine Ratio: 14 (ref 12–28)
BUN: 12 mg/dL (ref 8–27)
CO2: 21 mmol/L (ref 20–29)
Calcium: 9.5 mg/dL (ref 8.7–10.3)
Chloride: 101 mmol/L (ref 96–106)
Creatinine, Ser: 0.88 mg/dL (ref 0.57–1.00)
Glucose: 94 mg/dL (ref 70–99)
Potassium: 4.9 mmol/L (ref 3.5–5.2)
Sodium: 139 mmol/L (ref 134–144)
eGFR: 69 mL/min/1.73 (ref >59)

## 2024-03-29 LAB — TSH: TSH: 3.1 u[IU]/mL (ref 0.450–4.500)

## 2024-04-01 ENCOUNTER — Telehealth: Payer: Self-pay | Admitting: *Deleted

## 2024-04-01 ENCOUNTER — Ambulatory Visit: Admitting: Behavioral Health

## 2024-04-01 NOTE — Telephone Encounter (Addendum)
 Patient advised I will let Cath Lab know that patient will need to be admitted after procedure since no one to be with her if same day discharge. Cath Lab aware pt TBA.

## 2024-04-01 NOTE — Telephone Encounter (Signed)
 Cardiac Catheterization scheduled at [facility] for: Wednesday April 03, 2024 10 AM Arrival time [facility] Main Entrance A at: 8 AM  Nothing to eat after midnight prior to procedure, clear liquids until 5 AM day of procedure.  Medication instructions: -Usual morning medications can be taken with sips of water  including aspirin  81 mg.  Plan to go home the same day, you will only stay overnight if medically necessary.  You must have responsible adult to drive you home.  Someone must be with you the first 24 hours after you arrive home.  Reviewed procedure instructions with patient.

## 2024-04-01 NOTE — Telephone Encounter (Signed)
 Patient is following up. She says she won't have anyone to accompany/drive her and she will need to stay overnight. Please advise.

## 2024-04-02 NOTE — Telephone Encounter (Signed)
 Called patient advised of below they verbalized understanding.

## 2024-04-02 NOTE — Telephone Encounter (Signed)
-----   Message from Becky Lawson sent at 03/29/2024  8:09 AM EDT ----- Please let Becky Lawson know that her CBC shows no evidence of anemia or infection. Her kidney function and electrolytes are normal, her TSH is within normal range. Good results, she can proceed with  cardiac cath as planned.  ----- Message ----- From: [name] Lab Results In Sent: 03/28/2024  11:36 PM EDT To: Becky D West, NP

## 2024-04-03 ENCOUNTER — Other Ambulatory Visit: Payer: Self-pay

## 2024-04-03 ENCOUNTER — Encounter (HOSPITAL_COMMUNITY): Payer: Self-pay | Admitting: Cardiology

## 2024-04-03 ENCOUNTER — Observation Stay (HOSPITAL_COMMUNITY)
Admission: RE | Admit: 2024-04-03 | Discharge: 2024-04-04 | Disposition: A | Discharge status: Home / Self Care | Attending: Cardiology | Admitting: Cardiology

## 2024-04-03 ENCOUNTER — Encounter (HOSPITAL_COMMUNITY)
Admission: RE | Disposition: A | Discharge status: Home / Self Care | Payer: Self-pay | Source: Home / Self Care | Attending: Cardiology

## 2024-04-03 DIAGNOSIS — R931 Abnormal findings on diagnostic imaging of heart and coronary circulation: Secondary | ICD-10-CM | POA: Diagnosis present

## 2024-04-03 DIAGNOSIS — R0789 Other chest pain: Secondary | ICD-10-CM | POA: Diagnosis present

## 2024-04-03 DIAGNOSIS — E785 Hyperlipidemia, unspecified: Secondary | ICD-10-CM | POA: Diagnosis not present

## 2024-04-03 DIAGNOSIS — Z7982 Long term (current) use of aspirin: Secondary | ICD-10-CM | POA: Diagnosis not present

## 2024-04-03 DIAGNOSIS — Z9861 Coronary angioplasty status: Secondary | ICD-10-CM | POA: Diagnosis not present

## 2024-04-03 DIAGNOSIS — Z955 Presence of coronary angioplasty implant and graft: Principal | ICD-10-CM

## 2024-04-03 DIAGNOSIS — Z79899 Other long term (current) drug therapy: Secondary | ICD-10-CM | POA: Diagnosis not present

## 2024-04-03 DIAGNOSIS — I2510 Atherosclerotic heart disease of native coronary artery without angina pectoris: Secondary | ICD-10-CM | POA: Diagnosis not present

## 2024-04-03 DIAGNOSIS — I10 Essential (primary) hypertension: Secondary | ICD-10-CM | POA: Diagnosis not present

## 2024-04-03 DIAGNOSIS — I25119 Atherosclerotic heart disease of native coronary artery with unspecified angina pectoris: Secondary | ICD-10-CM | POA: Diagnosis not present

## 2024-04-03 DIAGNOSIS — R079 Chest pain, unspecified: Secondary | ICD-10-CM

## 2024-04-03 DIAGNOSIS — R9439 Abnormal result of other cardiovascular function study: Secondary | ICD-10-CM

## 2024-04-03 HISTORY — PX: CORONARY STENT INTERVENTION: CATH118234

## 2024-04-03 HISTORY — PX: LEFT HEART CATH AND CORONARY ANGIOGRAPHY: CATH118249

## 2024-04-03 MED ORDER — VERAPAMIL HCL 2.5 MG/ML IV SOLN
INTRAVENOUS | Status: AC
  Filled 2024-04-03: qty 2

## 2024-04-03 MED ORDER — LIDOCAINE HCL (PF) 1 % IJ SOLN
INTRAMUSCULAR | Status: DC | PRN
  Administered 2024-04-03: 3 mL

## 2024-04-03 MED ORDER — AMLODIPINE BESYLATE 5 MG PO TABS
10.0000 mg | ORAL_TABLET | Freq: Every day | ORAL | Status: DC
  Administered 2024-04-03 – 2024-04-04 (×2): 10 mg via ORAL
  Filled 2024-04-03: qty 2

## 2024-04-03 MED ORDER — IOHEXOL 350 MG/ML SOLN
INTRAVENOUS | Status: DC | PRN
  Administered 2024-04-03: 210 mL

## 2024-04-03 MED ORDER — HYDRALAZINE HCL 20 MG/ML IJ SOLN
10.0000 mg | INTRAMUSCULAR | Status: AC | PRN
Start: 2024-04-03 — End: 2024-04-03

## 2024-04-03 MED ORDER — SODIUM CHLORIDE 0.9 % WEIGHT BASED INFUSION: 1.0000 mL/kg/h | INTRAVENOUS | Status: DC

## 2024-04-03 MED ORDER — HEPARIN SODIUM (PORCINE) 1000 UNIT/ML IJ SOLN
INTRAMUSCULAR | Status: DC | PRN
  Administered 2024-04-03: 5000 [IU] via INTRAVENOUS
  Administered 2024-04-03: 2000 [IU] via INTRAVENOUS
  Administered 2024-04-03: 5000 [IU] via INTRAVENOUS
  Administered 2024-04-03: 3000 [IU] via INTRAVENOUS

## 2024-04-03 MED ORDER — MIDAZOLAM HCL 2 MG/2ML IJ SOLN
INTRAMUSCULAR | Status: AC
  Filled 2024-04-03: qty 2

## 2024-04-03 MED ORDER — TRAZODONE HCL 150 MG PO TABS: 300.0000 mg | ORAL_TABLET | Freq: Every day | ORAL | Status: DC

## 2024-04-03 MED ORDER — CLOPIDOGREL BISULFATE 300 MG PO TABS
ORAL_TABLET | ORAL | Status: AC
  Filled 2024-04-03: qty 2

## 2024-04-03 MED ORDER — HEPARIN BOLUS VIA INFUSION: 3000.0000 [IU] | Freq: Once | INTRAVENOUS | Status: DC

## 2024-04-03 MED ORDER — HYDRALAZINE HCL 20 MG/ML IJ SOLN
INTRAMUSCULAR | Status: AC
  Filled 2024-04-03: qty 1

## 2024-04-03 MED ORDER — LIDOCAINE HCL (PF) 1 % IJ SOLN
INTRAMUSCULAR | Status: AC
  Filled 2024-04-03: qty 30

## 2024-04-03 MED ORDER — LAMOTRIGINE 100 MG PO TABS
100.0000 mg | ORAL_TABLET | Freq: Every day | ORAL | Status: DC
Start: 2024-04-03 — End: 2024-04-04
  Administered 2024-04-03: 100 mg via ORAL
  Filled 2024-04-03: qty 1

## 2024-04-03 MED ORDER — OXYCODONE HCL 5 MG PO TABS
5.0000 mg | ORAL_TABLET | ORAL | Status: DC | PRN
  Administered 2024-04-03 – 2024-04-04 (×4): 5 mg via ORAL
  Filled 2024-04-03 (×4): qty 1

## 2024-04-03 MED ORDER — OXYCODONE-ACETAMINOPHEN 5-325 MG PO TABS
ORAL_TABLET | ORAL | Status: AC
Start: 2024-04-03 — End: 2024-04-03
  Administered 2024-04-03: 1
  Filled 2024-04-03: qty 1

## 2024-04-03 MED ORDER — HEPARIN SODIUM (PORCINE) 1000 UNIT/ML IJ SOLN
INTRAMUSCULAR | Status: AC
  Filled 2024-04-03: qty 10

## 2024-04-03 MED ORDER — DOCUSATE SODIUM 100 MG PO CAPS
100.0000 mg | ORAL_CAPSULE | Freq: Two times a day (BID) | ORAL | Status: DC
  Administered 2024-04-04: 100 mg via ORAL
  Filled 2024-04-03: qty 1

## 2024-04-03 MED ORDER — SODIUM CHLORIDE 0.9 % WEIGHT BASED INFUSION: 3.0000 mL/kg/h | INTRAVENOUS | Status: DC

## 2024-04-03 MED ORDER — HEPARIN (PORCINE) IN NACL 1000-0.9 UT/500ML-% IV SOLN
INTRAVENOUS | Status: DC | PRN
  Administered 2024-04-03 (×3): 500 mL

## 2024-04-03 MED ORDER — ONDANSETRON HCL 4 MG/2ML IJ SOLN: 4.0000 mg | Freq: Four times a day (QID) | INTRAMUSCULAR | Status: DC | PRN

## 2024-04-03 MED ORDER — SODIUM CHLORIDE 0.9 % IV SOLN
250.0000 mL | INTRAVENOUS | Status: DC | PRN
Start: 2024-04-03 — End: 2024-04-04

## 2024-04-03 MED ORDER — LEVOTHYROXINE SODIUM 100 MCG PO TABS
100.0000 ug | ORAL_TABLET | Freq: Every day | ORAL | Status: DC
  Administered 2024-04-04: 100 ug via ORAL
  Filled 2024-04-03: qty 1

## 2024-04-03 MED ORDER — FENTANYL CITRATE (PF) 100 MCG/2ML IJ SOLN
INTRAMUSCULAR | Status: DC | PRN
  Administered 2024-04-03 (×2): 50 ug via INTRAVENOUS
  Administered 2024-04-03 (×2): 25 ug via INTRAVENOUS

## 2024-04-03 MED ORDER — OXYCODONE-ACETAMINOPHEN 10-325 MG PO TABS: 1.0000 | ORAL_TABLET | ORAL | Status: DC | PRN

## 2024-04-03 MED ORDER — LOSARTAN POTASSIUM 50 MG PO TABS
50.0000 mg | ORAL_TABLET | Freq: Every day | ORAL | Status: DC
  Administered 2024-04-03 – 2024-04-04 (×2): 50 mg via ORAL
  Filled 2024-04-03 (×2): qty 1

## 2024-04-03 MED ORDER — VERAPAMIL HCL 2.5 MG/ML IV SOLN
INTRAVENOUS | Status: DC | PRN
  Administered 2024-04-03: 10 mL via INTRA_ARTERIAL

## 2024-04-03 MED ORDER — LAMOTRIGINE 100 MG PO TABS
200.0000 mg | ORAL_TABLET | Freq: Every day | ORAL | Status: DC
Start: 2024-04-04 — End: 2024-04-04
  Administered 2024-04-04: 200 mg via ORAL
  Filled 2024-04-03: qty 2

## 2024-04-03 MED ORDER — ASPIRIN 81 MG PO TBEC
81.0000 mg | DELAYED_RELEASE_TABLET | Freq: Every day | ORAL | Status: DC
  Administered 2024-04-04: 81 mg via ORAL
  Filled 2024-04-03: qty 1

## 2024-04-03 MED ORDER — NITROGLYCERIN 0.4 MG SL SUBL: 0.4000 mg | SUBLINGUAL_TABLET | SUBLINGUAL | Status: DC | PRN

## 2024-04-03 MED ORDER — SODIUM CHLORIDE 0.9% FLUSH
3.0000 mL | Freq: Two times a day (BID) | INTRAVENOUS | Status: DC
  Administered 2024-04-04: 3 mL via INTRAVENOUS

## 2024-04-03 MED ORDER — NITROGLYCERIN 1 MG/10 ML FOR IR/CATH LAB
INTRA_ARTERIAL | Status: AC
  Filled 2024-04-03: qty 10

## 2024-04-03 MED ORDER — POLYETHYLENE GLYCOL 3350 17 G PO PACK: 17.0000 g | PACK | Freq: Every day | ORAL | Status: DC | PRN

## 2024-04-03 MED ORDER — HYDRALAZINE HCL 20 MG/ML IJ SOLN
INTRAMUSCULAR | Status: DC | PRN
  Administered 2024-04-03: 10 mg via INTRAVENOUS

## 2024-04-03 MED ORDER — LABETALOL HCL 5 MG/ML IV SOLN
10.0000 mg | INTRAVENOUS | Status: AC | PRN
  Administered 2024-04-03: 10 mg via INTRAVENOUS

## 2024-04-03 MED ORDER — FENTANYL CITRATE (PF) 100 MCG/2ML IJ SOLN
INTRAMUSCULAR | Status: AC
  Filled 2024-04-03: qty 2

## 2024-04-03 MED ORDER — ATORVASTATIN CALCIUM 80 MG PO TABS
80.0000 mg | ORAL_TABLET | Freq: Every day | ORAL | Status: DC
  Administered 2024-04-03 – 2024-04-04 (×2): 80 mg via ORAL
  Filled 2024-04-03 (×2): qty 1

## 2024-04-03 MED ORDER — LABETALOL HCL 5 MG/ML IV SOLN
INTRAVENOUS | Status: AC
  Filled 2024-04-03: qty 4

## 2024-04-03 MED ORDER — MIDAZOLAM HCL 2 MG/2ML IJ SOLN
INTRAMUSCULAR | Status: DC | PRN
  Administered 2024-04-03: 1 mg via INTRAVENOUS
  Administered 2024-04-03: 2 mg via INTRAVENOUS
  Administered 2024-04-03: 1 mg via INTRAVENOUS

## 2024-04-03 MED ORDER — CLOPIDOGREL BISULFATE 75 MG PO TABS
75.0000 mg | ORAL_TABLET | Freq: Every day | ORAL | Status: DC
  Administered 2024-04-04: 75 mg via ORAL
  Filled 2024-04-03: qty 1

## 2024-04-03 MED ORDER — OXYCODONE-ACETAMINOPHEN 5-325 MG PO TABS
1.0000 | ORAL_TABLET | ORAL | Status: DC | PRN
  Administered 2024-04-03 – 2024-04-04 (×4): 1 via ORAL
  Filled 2024-04-03 (×4): qty 1

## 2024-04-03 MED ORDER — FREE WATER: 500.0000 mL | Freq: Once | Status: DC

## 2024-04-03 MED ORDER — QUETIAPINE FUMARATE 50 MG PO TABS
50.0000 mg | ORAL_TABLET | Freq: Every day | ORAL | Status: DC
  Administered 2024-04-03 – 2024-04-04 (×2): 50 mg via ORAL
  Filled 2024-04-03 (×2): qty 1

## 2024-04-03 MED ORDER — TRAZODONE HCL 50 MG PO TABS
250.0000 mg | ORAL_TABLET | Freq: Once | ORAL | Status: AC
  Administered 2024-04-03: 250 mg via ORAL
  Filled 2024-04-03: qty 5

## 2024-04-03 MED ORDER — ACETAMINOPHEN 325 MG PO TABS: 650.0000 mg | ORAL_TABLET | ORAL | Status: DC | PRN

## 2024-04-03 MED ORDER — ASPIRIN 81 MG PO CHEW: 81.0000 mg | CHEWABLE_TABLET | ORAL | Status: DC

## 2024-04-03 MED ORDER — PANTOPRAZOLE SODIUM 40 MG PO TBEC
40.0000 mg | DELAYED_RELEASE_TABLET | Freq: Every day | ORAL | Status: DC
  Administered 2024-04-04: 40 mg via ORAL
  Filled 2024-04-03: qty 1

## 2024-04-03 MED ORDER — TRAZODONE HCL 50 MG PO TABS
50.0000 mg | ORAL_TABLET | Freq: Every day | ORAL | Status: DC
  Administered 2024-04-03: 50 mg via ORAL
  Filled 2024-04-03: qty 1

## 2024-04-03 MED ORDER — AMLODIPINE BESYLATE 5 MG PO TABS
ORAL_TABLET | ORAL | Status: AC
  Filled 2024-04-03: qty 2

## 2024-04-03 MED ORDER — SODIUM CHLORIDE 0.9% FLUSH
3.0000 mL | INTRAVENOUS | Status: DC | PRN
Start: 2024-04-03 — End: 2024-04-04

## 2024-04-03 MED ORDER — ALPRAZOLAM 0.5 MG PO TABS
0.5000 mg | ORAL_TABLET | Freq: Every evening | ORAL | Status: DC | PRN
  Administered 2024-04-03: 0.5 mg via ORAL
  Filled 2024-04-03: qty 1

## 2024-04-03 MED ORDER — CLOPIDOGREL BISULFATE 300 MG PO TABS
ORAL_TABLET | ORAL | Status: DC | PRN
  Administered 2024-04-03: 600 mg via ORAL

## 2024-04-03 MED ORDER — OXYCODONE HCL 5 MG PO TABS
ORAL_TABLET | ORAL | Status: AC
  Administered 2024-04-03: 5 mg
  Filled 2024-04-03: qty 1

## 2024-04-03 MED ORDER — ATENOLOL 25 MG PO TABS
25.0000 mg | ORAL_TABLET | Freq: Every day | ORAL | Status: DC
  Administered 2024-04-03 – 2024-04-04 (×2): 25 mg via ORAL
  Filled 2024-04-03 (×2): qty 1

## 2024-04-03 SURGICAL SUPPLY — 16 items
BALLOON EMERGE MR 3.5X15 (BALLOONS) IMPLANT
BALLOON SAPPHIRE 2.5X12 (BALLOONS) IMPLANT
BALLOON SAPPHIRE NC24 3.5X8 (BALLOONS) IMPLANT
CATH INFINITI AMBI 5FR TG (CATHETERS) IMPLANT
CATH VISTA GUIDE 6FR XBLD 3.5 (CATHETERS) IMPLANT
DEVICE RAD COMP TR BAND LRG (VASCULAR PRODUCTS) IMPLANT
GLIDESHEATH SLEND A-KIT 6F 22G (SHEATH) IMPLANT
GUIDEWIRE ANGLED .035X150CM (WIRE) IMPLANT
GUIDEWIRE INQWIRE 1.5J.035X260 (WIRE) IMPLANT
KIT ENCORE 26 ADVANTAGE (KITS) IMPLANT
KIT HEMO VALVE WATCHDOG (MISCELLANEOUS) IMPLANT
PACK CARDIAC CATHETERIZATION (CUSTOM PROCEDURE TRAY) ×1 IMPLANT
SET ATX-X65L (MISCELLANEOUS) IMPLANT
STENT SYNERGY XD 3.0X12 (Permanent Stent) IMPLANT
STENT SYNERGY XD 3.50X12 (Permanent Stent) IMPLANT
WIRE RUNTHROUGH .014X180CM (WIRE) IMPLANT

## 2024-04-03 NOTE — Plan of Care (Signed)
  Problem: Education: Goal: Understanding of CV disease, CV risk reduction, and recovery process will improve Outcome: Progressing   Problem: Activity: Goal: Ability to return to baseline activity level will improve Outcome: Progressing   Problem: Cardiovascular: Goal: Ability to achieve and maintain adequate cardiovascular perfusion will improve Outcome: Progressing   Problem: Education: Goal: Knowledge of General Education information will improve Description: Including pain rating scale, medication(s)/side effects and non-pharmacologic comfort measures Outcome: Progressing   Problem: Nutrition: Goal: Adequate nutrition will be maintained Outcome: Progressing   Problem: Coping: Goal: Level of anxiety will decrease Outcome: Progressing   Problem: Elimination: Goal: Will not experience complications related to bowel motility Outcome: Progressing Goal: Will not experience complications related to urinary retention Outcome: Progressing   Problem: Pain Managment: Goal: General experience of comfort will improve and/or be controlled Outcome: Progressing

## 2024-04-03 NOTE — TOC CM/SW Note (Signed)
 Transition of Care [facility]) - Inpatient Brief Assessment   Patient Details  Name: Becky Lawson MRN: [id number] Date of Birth: May 28, 1951  [facility] CM/SW Contact:    Lauraine FORBES Saa, LCSW Phone Number: 04/03/2024, 4:40 PM   Clinical Narrative:  4:40 PM Per chart review, patient resides at home. Patient has a PCP and insurance. Patient does not have SNF or HH history. Patient has BSC through private pay. Patient's preferred pharmacy is [facility]. No TOC needs were identified at this time. TOC will continue to follow and be available to assist.  Transition of Care Asessment: Insurance and Status: Insurance coverage has been reviewed Patient has primary care physician: Yes Home environment has been reviewed: Private Residence Prior level of function:: N/A Prior/Current Home Services: No current home services Social Drivers of Health Review: SDOH reviewed no interventions necessary Readmission risk has been reviewed: Yes Transition of care needs: no transition of care needs at this time

## 2024-04-03 NOTE — Progress Notes (Addendum)
 I reached out to both patient's son and daughter, called daughter twice, could not get an answer hence left messages.  I rounded on her multiple times and reviewed telemetry, no ventricular arrhythmias or PVCs, no ST-T changes, patient remains asymptomatic.  Plan is to proceed with planned staged intervention to right coronary artery and relook at the left and we will reevaluate PCI to mid LAD stenosis.  Will review with colleagues as well.  This is to be scheduled in the outpatient basis in 2 to 3 weeks, patient is aware and is willing.     Gordy Bergamo, MD, [facility] 04/03/2024, 6:28 PM [facility] [address] [place], [place] [address] Phone: [phone]. Fax:  [phone]

## 2024-04-03 NOTE — Interval H&P Note (Signed)
 History and Physical Interval Note:  04/03/2024 9:47 AM  Becky Lawson  has presented today for surgery, with the diagnosis of chest pain - cad.  The various methods of treatment have been discussed with the patient and family. After consideration of risks, benefits and other options for treatment, the patient has consented to  Procedure(s): LEFT HEART CATH AND CORONARY ANGIOGRAPHY (N/A) and possible coronary angioplasty as a surgical intervention.  The patient's history has been reviewed, patient examined, no change in status, stable for surgery.  I have reviewed the patient's chart and labs.  Questions were answered to the patient's satisfaction.   Cath Lab Visit (complete for each Cath Lab visit)  Clinical Evaluation Leading to the Procedure:   ACS: No.  Non-ACS:    Anginal Classification: CCS II  Anti-ischemic medical therapy: Maximal Therapy (2 or more classes of medications)  Non-Invasive Test Results: Intermediate-risk stress test findings: cardiac mortality 1-3%/year  Prior CABG: No previous CABG   Gordy Bergamo

## 2024-04-04 ENCOUNTER — Other Ambulatory Visit (HOSPITAL_COMMUNITY): Payer: Self-pay

## 2024-04-04 DIAGNOSIS — I10 Essential (primary) hypertension: Secondary | ICD-10-CM | POA: Diagnosis not present

## 2024-04-04 DIAGNOSIS — Z9861 Coronary angioplasty status: Secondary | ICD-10-CM | POA: Diagnosis not present

## 2024-04-04 DIAGNOSIS — E7800 Pure hypercholesterolemia, unspecified: Secondary | ICD-10-CM

## 2024-04-04 DIAGNOSIS — Z79899 Other long term (current) drug therapy: Secondary | ICD-10-CM | POA: Diagnosis not present

## 2024-04-04 DIAGNOSIS — Z7982 Long term (current) use of aspirin: Secondary | ICD-10-CM | POA: Diagnosis not present

## 2024-04-04 DIAGNOSIS — I25118 Atherosclerotic heart disease of native coronary artery with other forms of angina pectoris: Secondary | ICD-10-CM | POA: Diagnosis not present

## 2024-04-04 DIAGNOSIS — I25119 Atherosclerotic heart disease of native coronary artery with unspecified angina pectoris: Secondary | ICD-10-CM | POA: Diagnosis not present

## 2024-04-04 DIAGNOSIS — E785 Hyperlipidemia, unspecified: Secondary | ICD-10-CM | POA: Diagnosis not present

## 2024-04-04 LAB — POCT ACTIVATED CLOTTING TIME
Activated Clotting Time: 204 s
Activated Clotting Time: 366 s

## 2024-04-04 LAB — BASIC METABOLIC PANEL WITH GFR
Anion gap: 10 (ref 5–15)
BUN: 10 mg/dL (ref 8–23)
CO2: 23 mmol/L (ref 22–32)
Calcium: 8.3 mg/dL — ABNORMAL LOW (ref 8.9–10.3)
Chloride: 105 mmol/L (ref 98–111)
Creatinine, Ser: 0.97 mg/dL (ref 0.44–1.00)
GFR, Estimated: >60 mL/min (ref >60)
Glucose, Bld: 119 mg/dL — ABNORMAL HIGH (ref 70–99)
Potassium: 3.9 mmol/L (ref 3.5–5.1)
Sodium: 138 mmol/L (ref 135–145)

## 2024-04-04 LAB — CBC
HCT: 35.1 % — ABNORMAL LOW (ref 36.0–46.0)
Hemoglobin: 11.3 g/dL — ABNORMAL LOW (ref 12.0–15.0)
MCH: 30.9 pg (ref 26.0–34.0)
MCHC: 32.2 g/dL (ref 30.0–36.0)
MCV: 95.9 fL (ref 80.0–100.0)
Platelets: 207 K/uL (ref 150–400)
RBC: 3.66 MIL/uL — ABNORMAL LOW (ref 3.87–5.11)
RDW: 13.1 % (ref 11.5–15.5)
WBC: 5.8 K/uL (ref 4.0–10.5)
nRBC: 0 % (ref 0.0–0.2)

## 2024-04-04 MED ORDER — PANTOPRAZOLE SODIUM 40 MG PO TBEC
40.0000 mg | DELAYED_RELEASE_TABLET | Freq: Every day | ORAL | 0 refills | Status: DC
  Filled 2024-04-04: qty 90, 90d supply, fill #0

## 2024-04-04 MED ORDER — CLOPIDOGREL BISULFATE 75 MG PO TABS
75.0000 mg | ORAL_TABLET | Freq: Every day | ORAL | 2 refills | Status: DC
  Filled 2024-04-04: qty 90, 90d supply, fill #0

## 2024-04-04 MED ORDER — EZETIMIBE 10 MG PO TABS
10.0000 mg | ORAL_TABLET | Freq: Every day | ORAL | 1 refills | Status: DC
  Filled 2024-04-04: qty 90, 90d supply, fill #0

## 2024-04-04 MED ORDER — EZETIMIBE 10 MG PO TABS
10.0000 mg | ORAL_TABLET | Freq: Every day | ORAL | Status: DC
  Administered 2024-04-04: 10 mg via ORAL
  Filled 2024-04-04: qty 1

## 2024-04-04 NOTE — Discharge Summary (Signed)
 Discharge Summary   Patient ID: Becky Lawson MRN: [id number]; DOB: 07-10-1951  Admit date: 04/03/2024 Discharge date: 04/04/2024  PCP:  Levora Reyes SAUNDERS, MD   [facility] Providers Cardiologist:  Dorn Lesches, MD     Discharge Diagnoses  Principal Problem:   Post PTCA Active Problems:   Hyperlipidemia   Elevated coronary artery calcium  score  Diagnostic Studies/Procedures   Cardiac Catheterization 04/03/24: Hemodynamic data: LVEDP 35 mm Hg, Markedly elevated, no pressure gradient across the aortic valve.   Angiographic data: LM: Large-caliber vessel, mildly calcified. LAD: It is a moderate large caliber vessel, proximal LAD has diffuse 30 to 40% calcific stenosis followed by a very focal 95% calcific stenosis at the origin of D2 and is tortuous at this segment.  Distal-apical LAD has a 70% focal stenosis. LCx: Codominant with RCA.  Moderately calcified.  There is a very focal 95% calcific stenosis in the midsegment just proximal to the origin of a very large OM1.  OM1 has a ostial 30 to 40% calcific stenosis. RCA: Codominant with CX, continues as a moderate-sized PL branch with very small PDA, has a focal 90% stenosis at origin of RV branch in the midsegment.   Intervention data: Successful but difficult procedure due to calcification and distal dissection.  PTCA and stenting of the mid CX and OM1 with 2 overlapping 3.5 x 12 in the CX and 3.0 x 12 mm Synergy XD DES overlapping into the OM1, stenosis reduced from 95% to 0% with TIMI-3 to TIMI-3 flow.      Impression and recommendations: Patient has severely complex three-vessel coronary disease and probably would be better suited for CABG.  I attempted to pass the guidewire into the LAD after the CX PCI, and I was unable to pass a 2.5 mm balloon to predilate the lesion.  There was also spasm mid segment Cx and also a small type a dissection was evident postprocedure in the LAD.  I suspect if she needs PCI to the LAD, it  is can be a complex long-term intervention with atherectomy.  RCA lesion is very focal.   I have recommended that we observe her overnight for any potential post PCI complication, discharge in the morning, elective staged intervention to RCA and will take a relook at LAD at that time and consider atherectomy followed by stenting of a focal high-grade mid LAD stenosis avoiding stenting the long proximal segment.   _____________   History of Present Illness   Becky Lawson is a 73 y.o. female with PMH of prediabetes, hypertension, hyperlipidemia, prior tobacco use.  First evaluated on 09/28/2023 by Dr. Alvan for evaluation of chest pain.   At office visit patient reported that she had had intermittent chest pain for the prior 3 weeks, pain described as a sudden, heavy pressure in the middle chest that sometimes radiated to the chin.  Coronary CTA on 10/12/2023 indicated at least moderate stenosis, coronary calcium  score of 3084, 99th percentile for age, sex and race matched controls.  Total plaque volume 1268 mm3, 96 percentile for age and sex matched control.  CT FFR analysis did not show any significant focal stenosis, there was gradual decrease in FFR across LCx system, FFR drops when the vessel becomes smaller in caliber after OM2 but there is no discrete point of stenosis, proximal FFR 0.98, mid FFR after OM two 0.76, distal FFR of 0.7.  Echocardiogram on 11/08/2023 indicated LVEF of 65 to 70%, no RWMA, grade 1 diastolic dysfunction, RV size and  function was normal, LA was moderately dilated, aortic valve sclerosis was evident without aortic valve stenosis.   Patient was seen in clinic on 12/25/23 by Dr. Court patient denied any chest pain however did report jaw pain which was nitrate responsive.  A cardiac PET was ordered to further evaluate.  PET stress on 03/27/2024 indicated abnormal LV perfusion with evidence of ischemia, no evidence of infarction.  There was a medium defect with moderate reduction in  uptake present in the apical septal and apex locations that was reversible, there was normal wall motion in the defect area, consistent with ischemia, noted to have severe three-vessel calcium , abnormal MBF T and 3 times daily suggesting three-vessel CAD and not just distal LAD ischemia.   Seen back in the clinic on 7/24 and reported that she had an episode of chest discomfort earlier that day at the gym resolved with a single nitroglycerin . Patient had intermittent chest pain since last office visit, reported this typically resolved with rest or following a single sublingual nitroglycerin , noted only once has she had to take two sublingual nitroglycerin . She also endorses dyspnea with exertion such as when doing chores around the house. Set up for outpatient cardiac cath.    Hospital Course    CAD with angina -- underwent cardiac cath noted above with severe 3v CAD, successful PCI/DES to the LCx. Recommendations for DAPT with ASA/Plavix . Will need staged intervention of the LAD and RCA in 2-3 weeks. No complications noted overnight. Seen by cardiac rehab.  -- continue ASA, plavix , atorvastatin , atenolol , amlodipine , losartan   HLD -- LDL 73 on atorvastatin  80mg  daily, added Zetia  at discharge  HTN -- controlled, continue atenolol , amlodipine , losartan   Did the patient have an acute coronary syndrome (MI, NSTEMI, STEMI, etc) this admission?:  No                               Did the patient have a percutaneous coronary intervention (stent / angioplasty)?:  Yes.     Cath/PCI Registry Performance & Quality Measures: Aspirin  prescribed? - Yes ADP Receptor Inhibitor (Plavix /Clopidogrel , Brilinta/Ticagrelor or Effient/Prasugrel) prescribed (includes medically managed patients)? - Yes High Intensity Statin (Lipitor 40-80mg  or Crestor 20-40mg ) prescribed? - Yes For EF <40%, was ACEI/ARB prescribed? - Not Applicable (EF >/= 40%) For EF <40%, Aldosterone Antagonist (Spironolactone  or Eplerenone)  prescribed? - Not Applicable (EF >/= 40%) Cardiac Rehab Phase II ordered? - Yes     The patient will be scheduled for a TOC follow up appointment in 10-14 days.  A message has been sent to the [facility] and Scheduling Pool at the office where the patient should be seen for follow up.  _____________  Discharge Vitals Blood pressure 127/70, pulse 72, temperature 98.1 F (36.7 C), temperature source Oral, resp. rate 17, height 5' 4.5 (1.638 m), weight 80.7 kg, SpO2 95%.  Filed Weights   04/03/24 0823  Weight: 80.7 kg    Labs & Radiologic Studies  CBC Recent Labs    04/04/24 0333  WBC 5.8  HGB 11.3*  HCT 35.1*  MCV 95.9  PLT 207   Basic Metabolic Panel Recent Labs    92/68/74 0333  NA 138  K 3.9  CL 105  CO2 23  GLUCOSE 119*  BUN 10  CREATININE 0.97  CALCIUM  8.3*   Liver Function Tests No results for input(s): AST, ALT, ALKPHOS, BILITOT, PROT, ALBUMIN  in the last 72 hours. No results for input(s): LIPASE, AMYLASE  in the last 72 hours. High Sensitivity Troponin:   No results for input(s): TROPONINIHS in the last 720 hours.  No results for input(s): TRNPT in the last 720 hours.  BNP Invalid input(s): POCBNP No results for input(s): PROBNP in the last 72 hours.  No results for input(s): BNP in the last 72 hours.  D-Dimer No results for input(s): DDIMER in the last 72 hours. Hemoglobin A1C No results for input(s): HGBA1C in the last 72 hours. Fasting Lipid Panel No results for input(s): CHOL, HDL, LDLCALC, TRIG, CHOLHDL, LDLDIRECT in the last 72 hours. No results found for: LIPOA  Thyroid  Function Tests No results for input(s): TSH, T4TOTAL, T3FREE, THYROIDAB in the last 72 hours.  Invalid input(s): FREET3 _____________  CARDIAC CATHETERIZATION Addendum Date: 04/03/2024 Cardiac Catheterization 04/03/24: Hemodynamic data: LVEDP 35 mm Hg, Markedly elevated, no pressure gradient across the aortic valve.  Angiographic data: LM: Large-caliber vessel, mildly calcified. LAD: It is a moderate large caliber vessel, proximal LAD has diffuse 30 to 40% calcific stenosis followed by a very focal 95% calcific stenosis at the origin of D2 and is tortuous at this segment.  Distal-apical LAD has a 70% focal stenosis. LCx: Codominant with RCA.  Moderately calcified.  There is a very focal 95% calcific stenosis in the midsegment just proximal to the origin of a very large OM1.  OM1 has a ostial 30 to 40% calcific stenosis. RCA: Codominant with CX, continues as a moderate-sized PL branch with very small PDA, has a focal 90% stenosis at origin of RV branch in the midsegment. Intervention data: Successful but difficult procedure due to calcification and distal dissection.  PTCA and stenting of the mid CX and OM1 with 2 overlapping 3.5 x 12 in the CX and 3.0 x 12 mm Synergy XD DES overlapping into the OM1, stenosis reduced from 95% to 0% with TIMI-3 to TIMI-3 flow. Impression and recommendations: Patient has severely complex three-vessel coronary disease and probably would be better suited for CABG.  I attempted to pass the guidewire into the LAD after the CX PCI, and I was unable to pass a 2.5 mm balloon to predilate the lesion.  There was also spasm mid segment Cx and also a small type a dissection was evident postprocedure in the LAD.  I suspect if she needs PCI to the LAD, it is can be a complex long-term intervention with atherectomy.  RCA lesion is very focal. I have recommended that we observe her overnight for any potential post PCI complication, discharge in the morning, elective staged intervention to RCA and will take a relook at LAD at that time and consider atherectomy followed by stenting of a focal high-grade mid LAD stenosis avoiding stenting the long proximal segment.  Result Date: 04/03/2024 Images from the original result were not included. Cardiac Catheterization 04/03/24: Hemodynamic data: LVEDP 35 mm Hg,  Markedly elevated, no pressure gradient across the aortic valve. Angiographic data: LM: Large-caliber vessel, mildly calcified. LAD: It is a moderate large caliber vessel, proximal LAD has diffuse 30 to 40% calcific stenosis followed by a very focal 95% calcific stenosis at the origin of D2 and is tortuous at this segment.  Distal-apical LAD has a 70% focal stenosis. LCx: Codominant with RCA.  Moderately calcified.  There is a very focal 95% calcific stenosis in the midsegment just proximal to the origin of a very large OM1.  OM1 has a ostial 30 to 40% calcific stenosis. RCA: Codominant with CX, continues as a moderate-sized PL branch with very  small PDA, has a focal 90% stenosis at origin of RV branch in the midsegment. Intervention data: Successful but difficult procedure due to calcification and distal dissection.  PTCA and stenting of the mid CX and OM1 with 2 overlapping 3.5 x 12 in the CX and 3.0 x 12 mm Synergy XD DES overlapping into the OM1, stenosis reduced from 95% to 0% with TIMI-3 to TIMI-3 flow. Impression and recommendations: Patient has severely complex three-vessel coronary disease and probably would be better suited for CABG.  I attempted to pass the guidewire into the LAD after the CX PCI, and I was unable to pass a 2.5 mm balloon to predilate the lesion.  There was also spasm mid segment Cx and also a small type a dissection was evident postprocedure in the LAD.  I suspect if she needs PCI to the LAD, it is can be a complex long-term intervention with atherectomy.  RCA lesion is very focal. I have recommended that we observe her overnight for any potential post PCI complication, discharge in the morning, elective staged intervention to RCA and will take a relook at LAD at that time and consider atherectomy followed by stenting of a focal high-grade mid LAD stenosis avoiding stenting the long proximal segment.   NM PET CT CARDIAC PERFUSION MULTI W/ABSOLUTE BLOODFLOW Result Date: 03/27/2024   LV  perfusion is abnormal. There is evidence of ischemia. There is no evidence of infarction. Defect 1: There is a medium defect with moderate reduction in uptake present in the apical septal and apex location(s) that is reversible. There is normal wall motion in the defect area. Consistent with ischemia.   Rest left ventricular function is normal. Rest EF: 59%. Stress left ventricular function is normal. Stress EF: 63%. End diastolic cavity size is normal. End systolic cavity size is normal.   Myocardial blood flow was computed to be 0.28ml/g/min at rest and 1.40ml/g/min at stress. Global myocardial blood flow reserve was 1.52 and was abnormal.   Coronary calcium  was present on the attenuation correction CT images. Severe coronary calcifications were present. Coronary calcifications were present in the left anterior descending artery, left circumflex artery and right coronary artery distribution(s).   Findings are consistent with ischemia. The study is intermediate risk.   Severely reduced MBFR in area of ischemia 0.88   Severe 3 vessel calcium  , abnormal MBFT and TID suggests 3 vessel CAD and not just distal LAD ischemia EXAM: OVER-READ INTERPRETATION  PET-CT CHEST The following report is an over-read performed by radiologist Dr. Camellia Lang [facility] on 03/27/2024. This over-read does not include interpretation of cardiac or coronary anatomy or pathology. The cardiac PET and cardiac CT interpretation by the cardiologist is to be attached. COMPARISON:  None. FINDINGS: No evidence for lymphadenopathy within the visualized mediastinum or hilar regions. The visualized lung parenchyma shows no suspicious pulmonary nodule or mass. No focal airspace consolidation. No effusion. Visualized portions of the upper abdomen are unremarkable. No suspicious lytic or sclerotic osseous abnormality. IMPRESSION: No acute or clinically significant extracardiac findings. Electronically Signed   By: Camellia Candle M.D.    On: 03/27/2024 09:25   Disposition Pt is being discharged home today in good condition.  Follow-up Plans & Appointments  Discharge Instructions     Amb Referral to Cardiac Rehabilitation   Complete by: As directed    Diagnosis: Coronary Stents   After initial evaluation and assessments completed: Virtual Based Care may be provided alone or in conjunction with Phase 2 Cardiac Rehab  based on patient barriers.: Yes   Intensive Cardiac Rehabilitation (ICR) [facility] location only OR Traditional Cardiac Rehabilitation (TCR) *If criteria for ICR are not met will enroll in TCR [facility] only): Yes   Call MD for:  difficulty breathing, headache or visual disturbances   Complete by: As directed    Call MD for:  persistant dizziness or light-headedness   Complete by: As directed    Call MD for:  redness, tenderness, or signs of infection (pain, swelling, redness, odor or green/yellow discharge around incision site)   Complete by: As directed    Diet - low sodium heart healthy   Complete by: As directed    Discharge instructions   Complete by: As directed    Radial Site Care Refer to this sheet in the next few weeks. These instructions provide you with information on caring for yourself after your procedure. Your caregiver may also give you more specific instructions. Your treatment has been planned according to current medical practices, but problems sometimes occur. Call your caregiver if you have any problems or questions after your procedure. HOME CARE INSTRUCTIONS You may shower the day after the procedure. Remove the bandage (dressing) and gently wash the site with plain soap and water . Gently pat the site dry.  Do not apply powder or lotion to the site.  Do not submerge the affected site in water  for 3 to 5 days.  Inspect the site at least twice daily.  Do not flex or bend the affected arm for 24 hours.  No lifting over 5 pounds (2.3 kg) for 5 days after your procedure.  Do not drive home if you  are discharged the same day of the procedure. Have someone else drive you.  You may drive 24 hours after the procedure unless otherwise instructed by your caregiver.  What to expect: Any bruising will usually fade within 1 to 2 weeks.  Blood that collects in the tissue (hematoma) may be painful to the touch. It should usually decrease in size and tenderness within 1 to 2 weeks.  SEEK IMMEDIATE MEDICAL CARE IF: You have unusual pain at the radial site.  You have redness, warmth, swelling, or pain at the radial site.  You have drainage (other than a small amount of blood on the dressing).  You have chills.  You have a fever or persistent symptoms for more than 72 hours.  You have a fever and your symptoms suddenly get worse.  Your arm becomes pale, cool, tingly, or numb.  You have heavy bleeding from the site. Hold pressure on the site.   PLEASE DO NOT MISS ANY DOSES OF YOUR PLAVIX !!!!! Also keep a log of you blood pressures and bring back to your follow up appt. Please call the office with any questions.   Patients taking blood thinners should generally stay away from medicines like ibuprofen , Advil , Motrin , naproxen, and Aleve due to risk of stomach bleeding. You may take Tylenol  as directed or talk to your primary doctor about alternatives.   PLEASE ENSURE THAT YOU DO NOT RUN OUT OF YOUR EFFIENT. This medication is very important to remain on for at least 6months. IF you have issues obtaining this medication due to cost please CALL the office 3-5 business days prior to running out in order to prevent missing doses of this medication.   Increase activity slowly   Complete by: As directed        Discharge Medications Allergies as of 04/04/2024   No Known Allergies  Medication List     STOP taking these medications    omeprazole  40 MG capsule Commonly known as: PRILOSEC       TAKE these medications    acetaminophen  500 MG tablet Commonly known as: TYLENOL  Take 2  tablets (1,000 mg total) by mouth every 6 (six) hours. What changed:  when to take this reasons to take this   ALPRAZolam  1 MG tablet Commonly known as: XANAX  TAKE 1/2 TO 1 TABLET BY MOUTH twice daily AS NEEDED FOR ANXIETY   amLODipine  10 MG tablet Commonly known as: NORVASC  Take 1 tablet (10 mg total) by mouth daily.   aspirin  EC 81 MG tablet Take 1 tablet (81 mg total) by mouth daily. Swallow whole.   atenolol  25 MG tablet Commonly known as: TENORMIN  Take 25 mg by mouth daily.   atorvastatin  80 MG tablet Commonly known as: LIPITOR Take 1 tablet (80 mg total) by mouth daily.   clopidogrel  75 MG tablet Commonly known as: PLAVIX  Take 1 tablet (75 mg total) by mouth daily with breakfast. Start taking on: April 05, 2024   docusate sodium  100 MG capsule Commonly known as: COLACE Take 1 capsule (100 mg total) by mouth 2 (two) times daily. What changed:  when to take this reasons to take this   ezetimibe  10 MG tablet Commonly known as: ZETIA  Take 1 tablet (10 mg total) by mouth daily. Start taking on: April 05, 2024   hydrOXYzine  25 MG tablet Commonly known as: ATARAX  TAKE ONE TABLET BY MOUTH every EIGHT hours AS NEEDED   lamoTRIgine  100 MG tablet Commonly known as: LAMICTAL  TAKE TWO TABLETS BY MOUTH EVERY MORNING and TAKE ONE TABLET BY MOUTH EVERY EVENING   levothyroxine  100 MCG tablet Commonly known as: SYNTHROID  TAKE 1 TABLET BY MOUTH DAILY BEFORE BREAKFAST   losartan  50 MG tablet Commonly known as: COZAAR  TAKE 1 TABLET BY MOUTH DAILY   nitroGLYCERIN  0.4 MG SL tablet Commonly known as: NITROSTAT  Place 0.4 mg under the tongue every 5 (five) minutes as needed for chest pain.   oxyCODONE -acetaminophen  10-325 MG tablet Commonly known as: PERCOCET Take 1 tablet by mouth every 4 (four) hours as needed for pain.   pantoprazole  40 MG tablet Commonly known as: PROTONIX  Take 1 tablet (40 mg total) by mouth daily at 6 (six) AM. Start taking on: April 05, 2024    polyethylene glycol 17 g packet Commonly known as: MIRALAX  / GLYCOLAX  Take 17 g by mouth daily as needed for mild constipation.   QUEtiapine  50 MG tablet Commonly known as: SEROQUEL  Take 1 tablet (50 mg total) by mouth daily.   traZODone  150 MG tablet Commonly known as: DESYREL  TAKE TWO TABLETS BY MOUTH everyday AT bedtime   Vitamin D  (Ergocalciferol ) 1.25 MG (50000 UNIT) Caps capsule Commonly known as: DRISDOL  Take 1 capsule (50,000 Units total) by mouth every 7 (seven) days. Sunday         Outstanding Labs/Studies FLP/LFTs in 8 weeks  Duration of Discharge Encounter: APP Time: 15 minutes   Signed, Manuelita Rummer, NP 04/04/2024, 9:46 AM

## 2024-04-04 NOTE — TOC Transition Note (Signed)
 Transition of Care [facility]) - Discharge Note   Patient Details  Name: Becky Lawson MRN: [id number] Date of Birth: Jan 10, 1951  Transition of Care [facility]) CM/SW Contact:  Roxie KANDICE Stain, RN Phone Number: 04/04/2024, 10:30 AM   Clinical Narrative:    [name] is stable to discharge home. Patient to follow up with PCP. No TOC needs at this time.    Final next level of care: Home/Self Care Barriers to Discharge: Barriers Resolved   Patient Goals and CMS Choice Patient states their goals for this hospitalization and ongoing recovery are:: return home          Discharge Placement                   home    Discharge Plan and Services Additional resources added to the After Visit Summary for                                       Social Drivers of Health (SDOH) Interventions SDOH Screenings   Food Insecurity: No Food Insecurity (04/03/2024)  Housing: Low Risk  (04/03/2024)  Transportation Needs: No Transportation Needs (04/03/2024)  Utilities: Not At Risk (04/03/2024)  Alcohol Screen: Low Risk  (03/19/2024)  Depression (PHQ2-9): Medium Risk (03/19/2024)  Financial Resource Strain: Low Risk  (03/19/2024)  Physical Activity: Insufficiently Active (03/19/2024)  Social Connections: Moderately Integrated (04/03/2024)  Stress: Stress Concern Present (03/19/2024)  Tobacco Use: Medium Risk (04/03/2024)  Health Literacy: Adequate Health Literacy (03/19/2024)     Readmission Risk Interventions     No data to display

## 2024-04-04 NOTE — Progress Notes (Signed)
 CARDIAC REHAB PHASE I   PRE:  Rate/Rhythm: 74 SR   BP:  Sitting: 127/70   SaO2: 95 SR  MODE:  Ambulation: 300 ft   POST:  Rate/Rhythm: 69 SR  BP:  Sitting: 145/67   SaO2: 95 SR  Pt ambulated in hallway with RW and standby assist, tolerated well with no s/sx. Pt uses 4 wheel walker at home due to back pain.  Pt educated on restrictions, DAPT, nutrition, and CRP2. Pt is N/A for exercise guidelines given residual disease and need for further PCI. Pt will have referral placed for CRP2 to [facility], however she understands she will have to wait to start until staged procedure is complete. She is very eager to return to exercise and silver sneakers, but voiced understanding. All other questions answered, left in recliner with call bell. [phone]   Con KATHEE Pereyra, MS, ACSM-CEP 04/04/2024 9:30 AM

## 2024-04-04 NOTE — Progress Notes (Signed)
  Progress Note  Patient Name: Becky Lawson Date of Encounter: 04/04/2024 [facility] Cardiologist: Dorn Lesches, MD   Interval Summary   Reports pain in back last night from spinal stenosis and pain in right arm from access. No chest pain or dyspnea.   Vital Signs Vitals:   04/03/24 1404 04/03/24 1600 04/03/24 2040 04/03/24 2229  BP: (!) 149/72 (!) 108/58 (!) 107/59 116/63  Pulse: 88  69   Resp: 20  18 18   Temp: 98.3 F (36.8 C) 98.2 F (36.8 C) 98.6 F (37 C) 98.1 F (36.7 C)  TempSrc: Oral Oral Oral Oral  SpO2: 96% 95% 93% 92%  Weight:      Height:        Intake/Output Summary (Last 24 hours) at 04/04/2024 0711 Last data filed at 04/03/2024 2200 Gross per 24 hour  Intake 360 ml  Output --  Net 360 ml      04/03/2024    8:23 AM 03/28/2024    1:35 PM 03/19/2024    2:42 PM  Last 3 Weights  Weight (lbs) 178 lb 178 lb 6.4 oz 183 lb  Weight (kg) 80.74 kg 80.922 kg 83.008 kg      Telemetry/ECG  NSR - Personally Reviewed  Physical Exam  GEN: No acute distress.   Neck: No JVD Cardiac: RRR, no murmurs, rubs, or gallops.  Respiratory: Clear to auscultation bilaterally. GI: Soft, nontender, non-distended  MS: No edema. Right forearm is quite bruised but soft. Good pulse.   Assessment & Plan  CAD with angina. Severe 3 vessel disease on cath. S/p PCI of the mid LCx with DES. On DAPT with ASA and Plavix . Planned staged PCI of the LAD/RCA in 2-3 weeks. Patient very much wants to go home today. Labs OK.  HLD on high dose lipitor. LDL 73. Would add Zetia  10 mg daily HTN. Controlled on atenolol , amlodipine , losartan      For questions or updates, please contact [facility] Please consult [email or web address] for contact info under       Signed, Tyrez Berrios [place], MD

## 2024-04-04 NOTE — Progress Notes (Signed)
 AVS gone over with patient, answered any/all questions, Ivs removed, TOC meds given, patient belongings gathered and patient was wheeled down to the discharge lounge.

## 2024-04-05 ENCOUNTER — Telehealth: Payer: Self-pay | Admitting: Cardiology

## 2024-04-05 ENCOUNTER — Ambulatory Visit: Payer: Self-pay | Admitting: Emergency Medicine

## 2024-04-05 ENCOUNTER — Telehealth (HOSPITAL_COMMUNITY): Payer: Self-pay

## 2024-04-05 LAB — LIPOPROTEIN A (LPA): Lipoprotein (a): 104.5 nmol/L — ABNORMAL HIGH (ref <75.0)

## 2024-04-05 NOTE — Telephone Encounter (Signed)
 Pt returned phone call and she is interested in the cardiac rehab program. I advised pt that we will contact her once she has been reviewed after her f/u.

## 2024-04-05 NOTE — Heart Team MDD (Signed)
 Heart Team Multi-Disciplinary Discussion  Patient: Becky Lawson  DOB: [birth year]  MRN: [id number]   Date: 04/05/2024  9:17 AM    Attendees: Interventional Cardiology: Deatrice Cage, MD Debby Como, MD Alm Clay, MD Gordy Bergamo, MD Newman Lawrence, MD Peter [place], MD Cara Lovelace, MD  Cardiothoracic Surgery: Linnie Rayas, MD     Patient History: 73 y.o. female with complaints of chest pain that is nitrate responsive. Patient reported that she had had intermittent chest pain described as a sudden, heavy pressure in the middle chest that sometimes radiated to the chin. Coronary CTA on 10/12/2023 indicated at least moderate stenosis, coronary calcium  score of 3084, 99th percentile for age, sex and race matched controls.  Total plaque volume 1268 mm3, 96 percentile for age and sex matched control. Echocardiogram on 11/08/2023 indicated LVEF of 65 to 70%, no RWMA, grade 1 diastolic dysfunction, RV size and function was normal, LA was moderately dilated, aortic valve sclerosis was evident without aortic valve stenosis. A cardiac PET was ordered to further evaluate.  PET stress on 03/27/2024 indicated abnormal LV perfusion with evidence of ischemia, no evidence of infarction.  There was a medium defect with moderate reduction in uptake present in the apical septal and apex locations that was reversible, there was normal wall motion in the defect area, consistent with ischemia, noted to have severe three-vessel calcium , abnormal MBF T and 3 times daily suggesting three-vessel CAD and not just distal LAD ischemia. Pt was seen in the clinic on 7/24 and reported that she had an episode of chest discomfort earlier that day at the gym resolved with a single nitroglycerin . Patient had intermittent chest pain since last office visit, reported this typically resolved with rest or following a single sublingual nitroglycerin , noted only once has she had to take two sublingual nitroglycerin . She  also endorses dyspnea with exertion such as when doing chores around the house.    Risk Factors: Hypertension Hyperlipidemia Tobacco Abuse     Review of Prior Angiography and PCI Procedures: Left heart cath and coronary angiography completed on 7/30 images were reviewed and discussed in detail including: LM: Large-caliber vessel, mildly calcified. LAD: It is a moderate large caliber vessel, proximal LAD has diffuse 30 to 40% calcific stenosis followed by a very focal 95% calcific stenosis at the origin of D2 and is tortuous at this segment.  Distal-apical LAD has a 70% focal stenosis. LCx: Codominant with RCA.  Moderately calcified. There is a very focal 95% calcific stenosis in the midsegment just proximal to the origin of a very large OM1. OM1 has a ostial 30 to 40% calcific stenosis. RCA: Codominant with CX, continues as a moderate-sized PL branch with very small PDA, has a focal 90% stenosis at origin of RV branch in the midsegment. Successful but difficult procedure due to calcification and distal dissection.  PTCA and stenting of the mid CX and OM1 with 2 overlapping 3.5 x 12 in the CX and 3.0 x 12 mm Synergy XD DES overlapping into the OM1, stenosis reduced from 95% to 0% with TIMI-3 to TIMI-3 flow. Additionally, attempted passing the guidewire into the LAD after the CX PCI, and unable to pass a 2.5 mm balloon to predilate the lesion. There was also spasm mid segment Cx and also a small type a dissection was evident postprocedure in the LAD. RCA lesion is very focal.       Discussion: After presentation, consideration of treatment options occurred including repeat PCI attempt to address other focal  lesions not repaired by the cardiac cath and PCI on 7/30 versus medical therapy versus referral for CABG. While the patient did have successful PCI/DES to the LCx, it was challenging due to dissection. The team discussed that patient has severe complex three-vessel CAD with heavy suspicion that  if pt needs PCI to the LAD, it will be complex and long-term intervention with atherectomy. Other consideration was given to the fact that the patient is now on antiplatelet therapy and complicates surgery timing. Team consensus placing importance with shared decision making with patient, was to wait 2-3 months with close outpatient follow up. Repeat left heart cath in 3 months, stop antiplatelet therapy and refer to CVTS for possible CABG.     Recommendations: CABG     Ellouise Elnor Myron, RN  04/05/2024 9:17 AM

## 2024-04-05 NOTE — Telephone Encounter (Signed)
 Pt insurance is active and benefits verified through [employer] Co-pay $15, DED 0/0 met, out of pocket $3,400/$1,244.11 met, co-insurance 0%. no pre-authorization required, [employer], REF# [id number]   How many CR sessions are covered? (ICR)no limit Is this a lifetime maximum or an annual maximum? annual Has the member used any of these services to date? no Is there a time limit (weeks/months) on start of program and/or program completion? no   Will contact patient to see if she is interested in the Cardiac Rehab Program. If interested, patient will need to complete follow up appt. Once completed, patient will be contacted for scheduling upon review by the RN Navigator.

## 2024-04-05 NOTE — Telephone Encounter (Signed)
 Patient with severe coronary calcification and three-vessel coronary disease, as dictated by cardiac catheterization report she would not be an ideal candidate for CABG but underwent PCI to CX and complicated by dissection needing further work on CX, LAD lesion was very tight with calcific stenosis, could not pass a 2.5 mm balloon.  Hence plan was to proceed with elective angioplasty to RCA and potentially relook at LAD and angioplasty with arthrectomy into the LAD.  However we had HEART TEAM meeting this morning on 04/05/2024 and after presentation, it was felt the best option would be still proceed with bypass surgery to the LAD, RCA and potentially if there is any restenosis by relook angiogram in 3 months, bypass surgery to circumflex marginal.  I called Mrs. Becky Lawson, she is presently asymptomatic states that she feels well.  I explained to her regarding her best option would have been CABG and not PCI.  She understands the decision made, states that she is willing to continue to follow-up with us  with recommendation given by heart team recommendations for elective CABG in 3 months at which time we could potentially hold her Plavix  to reduce the risk of bleeding.  Advised her to increase her activity slowly, to inform us  if she has rest pain or if she has worsening symptoms of angina and she is agreeable to this.     Gordy Bergamo, MD, [facility] 04/05/2024, 1:52 PM [facility] [address] [place], [place] [address] Phone: [phone]. Fax:  [phone]

## 2024-04-05 NOTE — Telephone Encounter (Signed)
 Attempted to call patient in regards to Cardiac Rehab - LM on VM

## 2024-04-08 ENCOUNTER — Telehealth: Payer: Self-pay | Admitting: Cardiology

## 2024-04-08 DIAGNOSIS — R079 Chest pain, unspecified: Secondary | ICD-10-CM

## 2024-04-08 DIAGNOSIS — I25118 Atherosclerotic heart disease of native coronary artery with other forms of angina pectoris: Secondary | ICD-10-CM

## 2024-04-08 DIAGNOSIS — R931 Abnormal findings on diagnostic imaging of heart and coronary circulation: Secondary | ICD-10-CM

## 2024-04-08 NOTE — Telephone Encounter (Signed)
 Patient calling to let our office know that she had two different spells. Would like to have her surgery date moved up. Please advise

## 2024-04-08 NOTE — Telephone Encounter (Signed)
 Called patient about her message. Patient stated she does not feel like she needs to go to the ED, but she would like to start the process of getting by pass. Will send message to DOD, Dr. Verlin for advisement. Encouraged patient to go to ED if she starts to have chest pain or her symptoms get worse. Patient verbalized understanding.     04/05/24  1:53 PM Note Patient with severe coronary calcification and three-vessel coronary disease, as dictated by cardiac catheterization report she would not be an ideal candidate for CABG but underwent PCI to CX and complicated by dissection needing further work on CX, LAD lesion was very tight with calcific stenosis, could not pass a 2.5 mm balloon.  Hence plan was to proceed with elective angioplasty to RCA and potentially relook at LAD and angioplasty with arthrectomy into the LAD.   However we had HEART TEAM meeting this morning on 04/05/2024 and after presentation, it was felt the best option would be still proceed with bypass surgery to the LAD, RCA and potentially if there is any restenosis by relook angiogram in 3 months, bypass surgery to circumflex marginal.   I called Mrs. Niels Hutchinson, she is presently asymptomatic states that she feels well.  I explained to her regarding her best option would have been CABG and not PCI.  She understands the decision made, states that she is willing to continue to follow-up with us  with recommendation given by heart team recommendations for elective CABG in 3 months at which time we could potentially hold her Plavix  to reduce the risk of bleeding.   Advised her to increase her activity slowly, to inform us  if she has rest pain or if she has worsening symptoms of angina and she is agreeable to this.        Gordy Bergamo, MD, [facility] 04/05/2024, 1:52 PM [facility] [address] [place], [place] [address] Phone: [phone]. Fax:  [phone]

## 2024-04-08 NOTE — Telephone Encounter (Signed)
 Dr. Verlin, DOD, gave the okay to order the referral for CT surgeon's office.

## 2024-04-09 NOTE — Telephone Encounter (Signed)
 We can go ahead and have an urgent referral. Hope to have the process in place to have surgery at 30 days post PCI if possible. If surgery is planned and date is available, I will make further arrangements

## 2024-04-23 ENCOUNTER — Encounter: Payer: Self-pay | Admitting: Cardiology

## 2024-04-23 ENCOUNTER — Ambulatory Visit: Attending: Cardiology | Admitting: Cardiology

## 2024-04-23 VITALS — BP 138/68 | HR 79 | Ht 64.5 in | Wt 187.0 lb

## 2024-04-23 DIAGNOSIS — I25118 Atherosclerotic heart disease of native coronary artery with other forms of angina pectoris: Secondary | ICD-10-CM

## 2024-04-23 DIAGNOSIS — R931 Abnormal findings on diagnostic imaging of heart and coronary circulation: Secondary | ICD-10-CM | POA: Diagnosis not present

## 2024-04-23 DIAGNOSIS — E782 Mixed hyperlipidemia: Secondary | ICD-10-CM

## 2024-04-23 DIAGNOSIS — I10 Essential (primary) hypertension: Secondary | ICD-10-CM | POA: Diagnosis not present

## 2024-04-23 MED ORDER — CLOPIDOGREL BISULFATE 75 MG PO TABS: 75.0000 mg | ORAL_TABLET | Freq: Every day | ORAL | 3 refills | Status: DC

## 2024-04-23 MED ORDER — EZETIMIBE 10 MG PO TABS: 10.0000 mg | ORAL_TABLET | Freq: Every day | ORAL | 3 refills | Status: AC

## 2024-04-23 MED ORDER — ISOSORBIDE MONONITRATE ER 30 MG PO TB24: 30.0000 mg | ORAL_TABLET | Freq: Every day | ORAL | 3 refills | Status: DC

## 2024-04-23 NOTE — Progress Notes (Addendum)
 Cardiology Office Note    Date:  04/23/2024  ID:  Becky Becky Lawson, DOB 05/20/1951, MRN [id number] PCP:  Becky Reyes SAUNDERS, MD  Cardiologist:  Becky Bergamo, MD  Electrophysiologist:  None   Chief Complaint: Follow up for CAD   History of Present Illness: .    Becky Becky Lawson is a 73 y.o. female with visit-pertinent history of prediabetes, hypertension, hyperlipidemia, prior tobacco use.  First evaluated Becky Lawson 09/28/2023 by Dr. Alvan for evaluation of chest pain.  At office visit patient reported that she had had intermittent chest pain for the prior 3 weeks, pain described as a sudden, heavy pressure in the middle chest that sometimes radiated to the chin.  Coronary CTA Becky Lawson 10/12/2023 indicated at least moderate stenosis, coronary calcium  score of 3084, 99th percentile for age, sex and race matched controls.  Total plaque volume 1268 mm3, 96 percentile for age and sex matched control.  CT FFR analysis did not show any significant focal stenosis, there was gradual decrease in FFR across LCx system, FFR drops when the vessel becomes smaller in caliber after OM2 but there is no discrete point of stenosis, proximal FFR 0.98, mid FFR after OM two 0.76, distal FFR of 0.7.  Echocardiogram Becky Lawson 11/08/2023 indicated LVEF of 65 to 70%, no RWMA, grade 1 diastolic dysfunction, RV size and function was normal, LA was moderately dilated, aortic valve sclerosis was evident without aortic valve stenosis.  Patient was seen in clinic Becky Lawson 12/25/23 by Dr. Court patient denied any chest pain however did report jaw pain which was nitrate responsive.  A cardiac PET was ordered to further evaluate.  PET stress Becky Lawson Becky Lawson indicated abnormal LV perfusion with evidence of ischemia, no evidence of infarction.  There was a medium defect with moderate reduction in uptake present in the apical septal and apex locations that was reversible, there was normal wall motion in the defect area, consistent with ischemia, noted to have severe three-vessel  calcium , abnormal MBF T and 3 times daily suggesting three-vessel CAD and not just distal LAD ischemia.  Patient was seen in clinic Becky Lawson 03/28/2024, she reported continued intermittent episodes of chest discomfort, typically resolve with rest or a single sublingual nitroglycerin .  Patient was set up for cardiac catheterization.  Cardiac catheterization Becky Lawson 04/03/24 Indicated severe three-vessel CAD, had successful PCI/DES to the LCx that was complicated by dissection with recommendations for DAPT with ASA/Plavix  and planned for staged intervention of the LAD and RCA in 2 to 3 weeks.  At heart team meeting Becky Lawson 04/05/2024 after presentation was felt the best option would be to proceed with bypass surgery to the LAD, RCA and potentially if there is any restenosis by relook angiogram in 3 months bypass surgery to circumflex marginal.  Today patient presents for follow-up.  She reports that she has had 5 episodes of chest discomfort/jaw since her cardiac catheterization.  She notes that these typically occur when she has been overexerting herself, resolves quickly with either rest or a single sublingual nitroglycerin .  She notes 1 episode that occurred while she was sitting and working Becky Lawson her living well, notes that she became very emotional resulting in increased jaw pain, resolved with a single sublingual nitroglycerin .  Patient notes that she typically does not have chest discomfort, normally has more jaw pain.  She notes when overexerting herself she does have increased dyspnea, again improves with rest.  She denies any palpitations, lower extremity edema, orthopnea or PND.  She denies any presyncope or syncope.  Patient  notes that she would like to proceed with surgery however reports that she is currently caring for her elderly cat that she is concerned she will need to put down in the near future. ROS: .   Today she denies lower extremity edema, palpitations, melena, hematuria, hemoptysis, diaphoresis, weakness,  presyncope, syncope, orthopnea, and PND.  All other systems are reviewed and otherwise negative. Studies Reviewed: Becky Becky Lawson   EKG:  EKG is ordered today, personally reviewed, demonstrating  EKG Interpretation Date/Time:  Tuesday April 23 2024 14:50:28 EDT Ventricular Rate:  79 PR Interval:  142 QRS Duration:  86 QT Interval:  352 QTC Calculation: 403 R Axis:   0  Text Interpretation: Normal sinus rhythm Minimal voltage criteria for LVH, may be normal variant ( R in aVL ) Nonspecific ST abnormality Nonspecific T wave abnormality lateral leads When compared with ECG of 04-Apr-2024 07:22, Premature atrial complexes are no longer Present Confirmed by Becky Becky Lawson [phone]) Becky Lawson 04/23/2024 6:37:45 PM   CV Studies: Cardiac studies reviewed are outlined and summarized above. Otherwise please see EMR for full report. Cardiac Studies & Procedures   ______________________________________________________________________________________________ CARDIAC CATHETERIZATION  CARDIAC CATHETERIZATION 04/03/2024  Conclusion Images from the original result were not included. Cardiac Catheterization 04/03/24: Hemodynamic data: LVEDP 35 mm Hg, Markedly elevated, no pressure gradient across the aortic valve.  Angiographic data: LM: Large-caliber vessel, mildly calcified. LAD: It is a moderate large caliber vessel, proximal LAD has diffuse 30 to 40% calcific stenosis followed by a very focal 95% calcific stenosis at the origin of D2 and is tortuous at this segment.  Distal-apical LAD has a 70% focal stenosis. LCx: Codominant with RCA.  Moderately calcified.  There is a very focal 95% calcific stenosis in the midsegment just proximal to the origin of a very large OM1.  OM1 has a ostial 30 to 40% calcific stenosis. RCA: Codominant with CX, continues as a moderate-sized PL Becky Lawson with very small PDA, has a focal 90% stenosis at origin of RV Becky Lawson in the midsegment.  Intervention data: Successful but difficult procedure due  to calcification and distal dissection.  PTCA and stenting of the mid CX and OM1 with 2 overlapping 3.5 x 12 in the CX and 3.0 x 12 mm Synergy XD DES overlapping into the OM1, stenosis reduced from 95% to 0% with TIMI-3 to TIMI-3 flow.    Impression and recommendations: Patient has severely complex three-vessel coronary disease and probably would be better suited for CABG.  I attempted to pass the guidewire into the LAD after the CX PCI, and I was unable to pass a 2.5 mm balloon to predilate the lesion.  There was also spasm mid segment Cx and also a small type a dissection was evident postprocedure in the LAD.  I suspect if she needs PCI to the LAD, it is can be a complex long-term intervention with atherectomy.  RCA lesion is very focal.  I have recommended that we observe her overnight for any potential post PCI complication, discharge in the morning, elective staged intervention to RCA and will take a relook at LAD at that time and consider atherectomy followed by stenting of a focal high-grade mid LAD stenosis avoiding stenting the long proximal segment.  Findings Coronary Findings Diagnostic  Dominance: Co-dominant  Left Anterior Descending Prox LAD to Mid LAD lesion is 40% stenosed. Mid LAD lesion is 95% stenosed. Dist LAD lesion is 70% stenosed.  Left Circumflex Prox Cx to Mid Cx lesion is 95% stenosed. The lesion is type C. The  lesion is moderately calcified.  First Obtuse Marginal Becky Lawson 1st Mrg lesion is 40% stenosed.  Right Coronary Artery Prox RCA lesion is 90% stenosed.  Intervention  Prox Cx to Mid Cx lesion Angioplasty Lesion length:  8 mm. CATH VISTA GUIDE 6FR XBLD 3.5 guide catheter was inserted. WIRE RUNTHROUGH .[id number] guidewire used to cross lesion. Balloon angioplasty was performed using a BALLOON EMERGE MR 3.5X15. Maximum pressure: 4 atm. Inflation time: 30 sec. Stent A drug-eluting stent was successfully placed using a STENT SYNERGY XD 3.50X12. Maximum  pressure: 13 atm. Inflation time: 45 sec. Stent strut is well apposed. Post-Intervention Lesion Assessment The intervention was successful. Pre-interventional TIMI flow is 3. Post-intervention TIMI flow is 3. No complications occurred at this lesion. There is a 0% residual stenosis post intervention.  1st Mrg lesion Stent A drug-eluting stent was successfully placed using a STENT SYNERGY XD 3.0X12. Maximum pressure: 11 atm. Inflation time: 30 sec. Stent strut is well apposed. Stent overlaps previously placed stent. Post-stent angioplasty was performed. Stent balloon used to post dill overlap at 16 atm for 30 S Post-Intervention Lesion Assessment The intervention was successful. Pre-interventional TIMI flow is 3. Post-intervention TIMI flow is 3. At this lesion, a dissection occurred. As I pulled the LAD guide wire, the guide catheter was sucked into the LAD and there was Prox LAD type A dissection without flow compromise and hence left alone due to complexity of the LAD stenosis. There is a 0% residual stenosis post intervention.   STRESS TESTS  NM PET CT CARDIAC PERFUSION MULTI W/ABSOLUTE BLOODFLOW Becky Lawson  Narrative   LV perfusion is abnormal. There is evidence of ischemia. There is no evidence of infarction. Defect 1: There is a medium defect with moderate reduction in uptake present in the apical septal and apex location(s) that is reversible. There is normal wall motion in the defect area. Consistent with ischemia.   Rest left ventricular function is normal. Rest EF: 59%. Stress left ventricular function is normal. Stress EF: 63%. End diastolic cavity size is normal. End systolic cavity size is normal.   Myocardial blood flow was computed to be 0.100ml/g/min at rest and 1.40ml/g/min at stress. Global myocardial blood flow reserve was 1.52 and was abnormal.   Coronary calcium  was present Becky Lawson the attenuation correction CT images. Severe coronary calcifications were present. Coronary  calcifications were present in the left anterior descending artery, left circumflex artery and right coronary artery distribution(s).   Findings are consistent with ischemia. The study is intermediate risk.   Severely reduced MBFR in area of ischemia 0.88   Severe 3 vessel calcium  , abnormal MBFT and TID suggests 3 vessel CAD and not just distal LAD ischemia  EXAM: OVER-READ INTERPRETATION  PET-CT CHEST  The following report is an over-read performed by radiologist Dr. Camellia Lang [facility] Becky Lawson, Becky Becky Lawson. This over-read does not include interpretation of cardiac or coronary anatomy or pathology. The cardiac PET and cardiac CT interpretation by the cardiologist is to be attached.  COMPARISON:  None.  FINDINGS: No evidence for lymphadenopathy within the visualized mediastinum or hilar regions.  The visualized lung parenchyma shows no suspicious pulmonary nodule or mass. No focal airspace consolidation. No effusion.  Visualized portions of the upper abdomen are unremarkable.  No suspicious lytic or sclerotic osseous abnormality.  IMPRESSION: No acute or clinically significant extracardiac findings.   Electronically Signed By: Becky Candle Becky Lawson. Becky Lawson: 03/27/2024 09:25   ECHOCARDIOGRAM  ECHOCARDIOGRAM COMPLETE 11/08/2023  Narrative ECHOCARDIOGRAM REPORT    Patient Name:  Becky Becky Lawson  Date of Exam: 11/08/2023 Medical Rec #:  [id number]     Height:       64.5 in Accession #:    [id number]    Weight:       200.0 lb Date of Birth:  February 19, 1951     BSA:          1.967 m Patient Age:    72 years      BP:           138/78 mmHg Patient Gender: F             HR:           108 bpm. Exam Location:  [address]  Procedure: 2D Echo, Cardiac Doppler and Color Doppler (Both Spectral and Color Flow Doppler were utilized during procedure).  Indications:    R93.1 CAD  History:        Patient has prior history of Echocardiogram examinations, most recent 06/21/2022. CAD,  Signs/Symptoms:Murmur and Chest Pain; Risk Factors:Hypertension, Dyslipidemia and Former Smoker.  Sonographer:    Elsie Bohr RDCS Referring Phys: [phone] Becky Becky Lawson  IMPRESSIONS   1. Left ventricular ejection fraction, by estimation, is 65 to 70%. The left ventricle has normal function. The left ventricle has no regional wall motion abnormalities. Left ventricular diastolic parameters are consistent with Grade I diastolic dysfunction (impaired relaxation). 2. Right ventricular systolic function is normal. The right ventricular size is normal. 3. Left atrial size was moderately dilated. 4. The mitral valve is normal in structure. Mild mitral valve regurgitation. No evidence of mitral stenosis. 5. The aortic valve is normal in structure. There is mild calcification of the aortic valve. Aortic valve regurgitation is not visualized. Aortic valve sclerosis/calcification is present, without any evidence of aortic stenosis. 6. There is high flow/turbulence over the pulmonic valve but no evidence of stenosis. 7. The inferior vena cava is normal in size with greater than 50% respiratory variability, suggesting right atrial pressure of 3 mmHg.  FINDINGS Left Ventricle: Left ventricular ejection fraction, by estimation, is 65 to 70%. The left ventricle has normal function. The left ventricle has no regional wall motion abnormalities. The left ventricular internal cavity size was normal in size. There is no left ventricular hypertrophy. Left ventricular diastolic parameters are consistent with Grade I diastolic dysfunction (impaired relaxation).  Right Ventricle: The right ventricular size is normal. No increase in right ventricular wall thickness. Right ventricular systolic function is normal.  Left Atrium: Left atrial size was moderately dilated.  Right Atrium: Right atrial size was normal in size.  Pericardium: There is no evidence of pericardial effusion.  Mitral Valve: The mitral valve  is normal in structure. Mild mitral annular calcification. Mild mitral valve regurgitation. No evidence of mitral valve stenosis.  Tricuspid Valve: The tricuspid valve is normal in structure. Tricuspid valve regurgitation is mild . No evidence of tricuspid stenosis.  Aortic Valve: The aortic valve is normal in structure. There is mild calcification of the aortic valve. Aortic valve regurgitation is not visualized. Aortic valve sclerosis/calcification is present, without any evidence of aortic stenosis.  Pulmonic Valve: The pulmonic valve was normal in structure. Pulmonic valve regurgitation is not visualized. No evidence of pulmonic stenosis.  Aorta: The aortic root is normal in size and structure.  Pulmonary Artery: There is high flow/turbulence over the pulmonic valve but no evidence of stenosis.  Venous: The inferior vena cava is normal in size with greater than 50% respiratory variability, suggesting right atrial pressure  of 3 mmHg.  IAS/Shunts: No atrial level shunt detected by color flow Doppler.   LEFT VENTRICLE PLAX 2D LVIDd:         3.60 cm   Diastology LVIDs:         2.50 cm   LV e' medial:    9.25 cm/s LV PW:         1.20 cm   LV E/e' medial:  11.4 LV IVS:        1.50 cm   LV e' lateral:   17.30 cm/s LVOT diam:     2.10 cm   LV E/e' lateral: 6.1 LV SV:         90 LV SV Index:   46 LVOT Area:     3.46 cm   RIGHT VENTRICLE             IVC RV S prime:     19.20 cm/s  IVC diam: 0.80 cm TAPSE (M-mode): 1.9 cm RVSP:           26.2 mmHg  LEFT ATRIUM             Index        RIGHT ATRIUM           Index LA diam:        4.10 cm 2.08 cm/m   RA Pressure: 3.00 mmHg LA Vol (A2C):   69.8 ml 35.49 ml/m  RA Area:     13.60 cm LA Vol (A4C):   78.3 ml 39.81 ml/m  RA Volume:   32.80 ml  16.68 ml/m LA Biplane Vol: 79.7 ml 40.52 ml/m AORTIC VALVE LVOT Vmax:   136.00 cm/s LVOT Vmean:  94.600 cm/s LVOT VTI:    0.260 m  AORTA Ao Root diam: 3.30 cm Ao Asc diam:  3.50  cm  MITRAL VALVE                TRICUSPID VALVE MV Area (PHT): 2.87 cm     TR Peak grad:   23.2 mmHg MV Decel Time: 264 msec     TR Vmax:        241.00 cm/s MV E velocity: 105.00 cm/s  Estimated RAP:  3.00 mmHg MV A velocity: 134.00 cm/s  RVSP:           26.2 mmHg MV E/A ratio:  0.78 SHUNTS Systemic VTI:  0.26 m Systemic Diam: 2.10 cm  Becky Fuel MD Electronically signed by Becky Fuel MD Signature Date/Time: 11/08/2023/3:36:55 PM    Final      CT SCANS  CT CORONARY MORPH W/CTA COR W/SCORE 10/12/2023  Addendum 10/26/2023  9:16 PM ADDENDUM REPORT: 10/26/2023 21:13  EXAM: OVER-READ INTERPRETATION  CT CHEST  The following report is an over-read performed by radiologist Dr. Fonda Mom [facility], Becky Becky Lawson 10/26/2023. This over-read does not include interpretation of cardiac or coronary anatomy or pathology. The coronary CTA interpretation by the cardiologist is attached.  COMPARISON:  None.  FINDINGS: Cardiovascular:  See findings discussed in the body of the report.  Mediastinum/Nodes: No suspicious adenopathy identified. Imaged mediastinal structures are unremarkable.  Lungs/Pleura: There is dependent basilar subsegmental atelectasis. No pneumonia or pulmonary edema. No pleural effusion or pneumothorax.  Upper Abdomen: No acute abnormality.  Musculoskeletal: No chest wall abnormality. No acute osseous findings. There are thoracic degenerative changes.  IMPRESSION:  No acute extracardiac incidental findings.   Electronically Signed By: Becky Field Becky Lawson. Becky Lawson: 10/26/2023 21:13  Narrative HISTORY: Chest pain/anginal equiv, intermediate CAD  risk, treadmill candidate  EXAM: Cardiac/Coronary CT  TECHNIQUE: The patient was scanned Becky Lawson a [employer].  PROTOCOL: A 120 kV prospective scan was triggered in the descending thoracic aorta at 111 HU's. Axial non-contrast 3 mm slices were carried out through the heart. The data  set was analyzed Becky Lawson a dedicated work station and scored using the Agatston method. Gantry rotation speed was 250 msecs and collimation was 0.6 mm. Heart rate was optimized medically and sl NTG was given. The 3D data set was reconstructed in 5% intervals of the 35-75 % of the R-R cycle. Systolic and diastolic phases were analyzed Becky Lawson a dedicated work station using MPR, MIP and VRT modes. The patient received 95mL OMNIPAQUE  IOHEXOL  350 MG/ML SOLN of contrast.  FINDINGS: Coronary calcium  score: The patient's coronary artery calcium  score is 3084, which places the patient in the 99th percentile.  Coronary arteries: Normal coronary origins.  Co- dominance.  Right Coronary Artery: Normal caliber vessel, gives rise to right PDA. Diffuse mixed calcified and noncalcified plaque throughout primarily proximal vessel. Maximum stenosis 25-49% proximal, 1-24% mid, and 1-24% distal.  Left Main Coronary Artery: Normal caliber vessel. No significant plaque or stenosis.  Left Anterior Descending Coronary Artery: Normal caliber vessel. Diffuse mixed calcified and noncalcified plaque throughout vessel. Maximum stenosis 25-49% proximal, 50-69% mid, and 50-69% distal. Gives rise to two small diagonal branches.  Left Circumflex Artery: Normal caliber vessel, gives rise to left PDA. Diffuse mixed calcified and noncalcified plaque throughout vessel. Maximum stenosis 25-49% proximal, 25-49% mid, and 50-69% distal. Gives rise to small first, large second, medium third OM branches.  Aorta: Normal size, 35 mm at the mid ascending aorta (level of the Becky bifurcation) measured double oblique. Aortic atherosclerosis. No dissection seen in visualized portions of the aorta.  Aortic Valve: Mild calcifications. Trileaflet.  Other findings:  Normal pulmonary vein drainage into the left atrium.  Normal left atrial appendage without a thrombus.  Normal size of the pulmonary artery.  Normal appearance of the  pericardium.  IMPRESSION: 1. CAD with at least moderate stenosis, CADRADS = 3. CT FFR will be performed and reported separately. Heavy calcification limits interpretation in certain segments.  2. Coronary calcium  score of 3084. This was 99th percentile for age-, sex-, and race- matched controls.  3. Total plaque volume 1268 mm3 which is 96th percentile for age- and sex- matched controls (calcified plaque 460 mm3; noncalcified plaque 808 mm3). Total plaque volume is extensive.  4. Normal coronary origin with co- dominance.  INTERPRETATION:  CAD-RADS 3: Moderate stenosis (50-69%). Consider symptom-guided anti-ischemic pharmacotherapy as well as risk factor modification per guideline directed care. Additional analysis with CT FFR will be submitted.  Electronically Signed: By: Becky Becky Lawson. Becky Lawson: 10/13/2023 14:14     ______________________________________________________________________________________________       Current Reported Medications:.    Current Meds  Medication Sig   isosorbide  mononitrate (IMDUR ) 30 MG 24 hr tablet Take 1 tablet (30 mg total) by mouth daily.   Current Facility-Administered Medications for the 04/23/24 encounter (Office Visit) with Yilin Weedon D, NP  Medication   cyanocobalamin  ((VITAMIN B-12)) injection 1,000 mcg    Physical Exam:    VS:  BP 138/68   Pulse 79   Ht 5' 4.5 (1.638 m)   Wt 187 lb (84.8 kg)   SpO2 98%   BMI 31.60 kg/m    Wt Readings from Last 3 Encounters:  04/23/24 187 lb (84.8 kg)  04/03/24 178 lb (80.7 kg)  03/28/24 178  lb 6.4 oz (80.9 kg)    GEN: Well nourished, well developed in no acute distress NECK: No JVD; No carotid bruits CARDIAC: RRR, no murmurs, rubs, gallops RESPIRATORY:  Clear to auscultation without rales, wheezing or rhonchi  ABDOMEN: Soft, non-tender, non-distended EXTREMITIES:  No edema; No acute deformity     Asessement and Plan:.    CAD: Coronary CTA Becky Lawson 10/12/2023 indicated at  least moderate stenosis, coronary calcium  score of 3084, 99th percentile for age, sex and race matched controls.  Total plaque volume 1268 mm3, 96 percentile for age and sex matched control.  CT FFR analysis did not show any significant focal stenosis, there was gradual decrease in FFR across LCx system, FFR drops when the vessel becomes smaller in caliber after OM2 but there is no discrete point of stenosis, proximal FFR 0.98, mid FFR after OM two 0.76, distal FFR of 0.7.  Echocardiogram Becky Lawson 11/08/2023 indicated LVEF of 65 to 70%, no RWMA.  PET stress Becky Lawson Becky Lawson indicated abnormal LV perfusion with evidence of ischemia, no evidence of infarction, medium defect with moderate reduction in uptake present in the apical septal and apex locations that was reversible, normal wall motion in the defect area consistent with ischemia.  Patient underwent cardiac catheterization with Dr. Ladona Becky Lawson 04/03/2024, patient had severely complex three-vessel coronary disease, patient had PTCA and stenting of the mid CX and OM1 with overlapping 3.5 x 12 and a CX and 3.0 x 12 mm Synergy XD DES overlapping into the OM1, noted be difficult procedure due to calcification and distal dissection.  Attempts were made to pass guidewire into the LAD after Cx PCI, Dr. Ladona was unable to pass a 2.5 mm balloon to predilate the lesion there was also a spasm mid segment CX and a small type a dissection was evident postprocedure in the LAD.  Plans were initially discussed for staged intervention of RCA and possibly LAD in 2 to 3 weeks however at heart team meeting Becky Lawson 04/05/2024 was felt best option would be to proceed with bypass surgery.  Today patient reports 5 episodes of discomfort since her cardiac catheterization, resolves with rest or a single sublingual nitroglycerin .  Discussed with patient concern for worsening symptoms and possible need for hospital evaluation, patient politely declined hospital evaluation, notes that she has things to care for  at home and cannot present to the hospital at this time.  Patient reports that if she has worsening symptoms and has discomfort that is not relieved with sublingual nitroglycerin  she will call EMS and present to the hospital.  Will start patient Becky Lawson Imdur  30 mg daily, reviewed ED precautions extensively.  Patient has office visit scheduled with Dr. Lucas tomorrow to discuss bypass.  Continue aspirin , atenolol , Lipitor, Plavix , Zetia , losartan .  Start Imdur  30 mg daily.  Check CBC, CMET and lipid profile.   HTN: Blood pressure today 138/68.  Start Imdur  30 mg daily, otherwise continue current antihypertensive regimen.  HLD: Last lipid profile 05/12/2023 indicated total cholesterol 149, HDL 50, triglycerides 124 and LDL 73.  Check fasting lipid profile and LFTs, pending lab work continue atorvastatin  80 mg daily.    Cardiac Rehabilitation Eligibility Assessment  The patient is NOT ready to start cardiac rehabilitation due to: -- (Patient to see [facility] for possible bypass surgery.)   Disposition: F/u with Mickayla Trouten, NP in four weeks.   ADDENDUM: Patient requested a transfer of care from Dr. Court to Dr. Ladona, both parties in agreement.  Signed, Amiliah Campisi D Camika Marsico, NP

## 2024-04-23 NOTE — Patient Instructions (Signed)
 Medication Instructions:  Start Imdur  30 mg once a day  *If you need a refill on your cardiac medications before your next appointment, please call your pharmacy*  Lab Work: Tomorrow we are going to need a fasting lipid panel, Cmet, and CBC If you have labs (blood work) drawn today and your tests are completely normal, you will receive your results only by: MyChart Message (if you have MyChart) OR A paper copy in the mail If you have any lab test that is abnormal or we need to change your treatment, we will call you to review the results.  Testing/Procedures: No testing  Follow-Up: At [facility], you and your health needs are our priority.  As part of our continuing mission to provide you with exceptional heart care, our providers are all part of one team.  This team includes your primary Cardiologist (physician) and Advanced Practice Providers or APPs (Physician Assistants and Nurse Practitioners) who all work together to provide you with the care you need, when you need it.  Your next appointment:   4 week(s)  Provider:   Katlyn West, NP  We recommend signing up for the patient portal called MyChart.  Sign up information is provided on this After Visit Summary.  MyChart is used to connect with patients for Virtual Visits (Telemedicine).  Patients are able to view lab/test results, encounter notes, upcoming appointments, etc.  Non-urgent messages can be sent to your provider as well.   To learn more about what you can do with MyChart, go to [email or web address].

## 2024-04-24 ENCOUNTER — Encounter: Admitting: Surgery

## 2024-04-24 ENCOUNTER — Ambulatory Visit: Attending: Surgery | Admitting: Surgery

## 2024-04-24 ENCOUNTER — Encounter: Payer: Self-pay | Admitting: Surgery

## 2024-04-24 VITALS — BP 132/67 | HR 82 | Resp 20 | Ht 64.5 in | Wt 187.0 lb

## 2024-04-24 DIAGNOSIS — I25118 Atherosclerotic heart disease of native coronary artery with other forms of angina pectoris: Secondary | ICD-10-CM | POA: Diagnosis not present

## 2024-04-24 DIAGNOSIS — I2510 Atherosclerotic heart disease of native coronary artery without angina pectoris: Secondary | ICD-10-CM

## 2024-04-24 LAB — LIPID PANEL

## 2024-04-25 ENCOUNTER — Encounter: Admitting: Surgery

## 2024-04-25 ENCOUNTER — Encounter: Payer: Self-pay | Admitting: *Deleted

## 2024-04-25 ENCOUNTER — Other Ambulatory Visit: Payer: Self-pay | Admitting: *Deleted

## 2024-04-25 DIAGNOSIS — I2510 Atherosclerotic heart disease of native coronary artery without angina pectoris: Secondary | ICD-10-CM

## 2024-04-25 LAB — LIPID PANEL
Cholesterol, Total: 130 mg/dL (ref 100–199)
HDL: 49 mg/dL (ref >39)
LDL CALC COMMENT:: 2.7 ratio (ref 0.0–4.4)
LDL Chol Calc (NIH): 64 mg/dL (ref 0–99)
Triglycerides: 89 mg/dL (ref 0–149)
VLDL Cholesterol Cal: 17 mg/dL (ref 5–40)

## 2024-04-25 LAB — COMPREHENSIVE METABOLIC PANEL WITH GFR
ALT: 9 IU/L (ref 0–32)
AST: 15 IU/L (ref 0–40)
Albumin: 4.5 g/dL (ref 3.8–4.8)
Alkaline Phosphatase: 80 IU/L (ref 44–121)
BUN/Creatinine Ratio: 10 — AB (ref 12–28)
BUN: 10 mg/dL (ref 8–27)
Bilirubin Total: 0.4 mg/dL (ref 0.0–1.2)
CO2: 23 mmol/L (ref 20–29)
Calcium: 9.2 mg/dL (ref 8.7–10.3)
Chloride: 103 mmol/L (ref 96–106)
Creatinine, Ser: 0.99 mg/dL (ref 0.57–1.00)
Globulin, Total: 2 g/dL (ref 1.5–4.5)
Glucose: 100 mg/dL — AB (ref 70–99)
Potassium: 5.1 mmol/L (ref 3.5–5.2)
Sodium: 140 mmol/L (ref 134–144)
Total Protein: 6.5 g/dL (ref 6.0–8.5)
eGFR: 60 mL/min/1.73 (ref >59)

## 2024-04-25 LAB — CBC
Hematocrit: 37.2 % (ref 34.0–46.6)
Hemoglobin: 12 g/dL (ref 11.1–15.9)
MCH: 31 pg (ref 26.6–33.0)
MCHC: 32.3 g/dL (ref 31.5–35.7)
MCV: 96 fL (ref 79–97)
Platelets: 254 x10E3/uL (ref 150–450)
RBC: 3.87 x10E6/uL (ref 3.77–5.28)
RDW: 12.8 % (ref 11.7–15.4)
WBC: 5.6 x10E3/uL (ref 3.4–10.8)

## 2024-04-26 ENCOUNTER — Ambulatory Visit: Payer: Self-pay | Admitting: Cardiology

## 2024-04-26 DIAGNOSIS — I25118 Atherosclerotic heart disease of native coronary artery with other forms of angina pectoris: Secondary | ICD-10-CM

## 2024-04-26 DIAGNOSIS — E782 Mixed hyperlipidemia: Secondary | ICD-10-CM

## 2024-04-26 NOTE — Telephone Encounter (Signed)
 Patient has already start on the Zetia  ordered labs for for patient and mailed to address on file

## 2024-04-26 NOTE — Progress Notes (Signed)
 [address], [place] [address]             [phone]    PCP is Levora Reyes SAUNDERS, MD Referring Provider is Verlin Lonni BIRCH*  Chief Complaint  Patient presents with   Coronary Artery Disease    Surgical consult, Cardiac cath 04/03/24/ ECHO 11/08/23    HPI:  The patient is a 73 year old woman with a history of prediabetes, hypertension, hyperlipidemia, hypothyroidism, prior smoking, and arthritis for which she takes oxycodone  and is followed in a pain clinic who presented with intermittent chest discomfort.  A coronary CTA in February 2025 showed a calcium  score of 3084 which was 99th percentile.  CT FFR analysis did not show any significant focal stenosis although there was a gradual decrease in the FFR across the left circumflex system as the vessel became smaller in caliber.  An echocardiogram in March 2025 showed normal left ventricular ejection fraction.  She continued to have episodes of right jaw and neck pain radiating down into her chest that were nitrate responsive.  A PET stress scan was done on 03/27/2024 evidence of ischemia.  She continued to have episodes of jaw and neck discomfort radiating into her chest as well as some pain down her right arm and underwent cardiac catheterization on 04/03/2024 showing severe three-vessel disease.  She had PCI with DES to the left circumflex that was complicated by dissection requiring stenting into the first marginal branch.  She also had significant disease in the LAD and RCA systems and was discharged on aspirin  and Plavix  with plans for a surgical evaluation after she was discussed at the multidisciplinary heart team meeting on 04/05/2024.  She reports continued episodes of right jaw and neck pain radiating into her chest and has taken several nitroglycerin  per day.  This usually resolves quickly after single nitroglycerin .  These episodes have occurred with emotional stress as well as exertion.  She has had some  exertional shortness of breath.  She denies lower extremity edema and orthopnea.  She lives alone with her cats and has an elderly cat that she is concerned about being at the end of his life and that is causing her a lot of stress.  Past Medical History:  Diagnosis Date   Arthritis    Benign cyst of right kidney 2014   2.7 cm right kidney cyst seen on imaging 2014 but was c/o right flank pain with new persistent proteinuria (fortunately hematuria had resolved) so repeat US  11/2016 showed right kidney still with 2.6 cm simple cyst and otherwise nml.   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, genotype 1b, followed at [facility]   Hyperlipidemia    Hypertension    Hypothyroidism    Jaundice 11/01/2012   Pill dysphagia 10/20/2021   Pruritic disorder 02/13/2013   Sleep apnea    Was diagnosed approximately 20 years ago, but does not wear CPAP patient stated I gave it back.SABRASABRAI couldn't wear that thing it was horrible   Spinal stenosis    Thyroid  disease    Phreesia 06/15/2020   Unintentional weight loss of more than 10 pounds 07/12/2018    Past Surgical History:  Procedure Laterality  Date   ABDOMINAL HYSTERECTOMY     COLONOSCOPY W/ POLYPECTOMY     CORONARY STENT INTERVENTION N/A 04/03/2024   Procedure: CORONARY STENT INTERVENTION;  Surgeon: Ladona Heinz, MD;  Location: [facility];  Service: Cardiovascular;  Laterality: N/A;   JOINT REPLACEMENT Bilateral    knee   KNEE ARTHROSCOPY Bilateral    LEFT HEART CATH AND CORONARY ANGIOGRAPHY N/A 04/03/2024   Procedure: LEFT HEART CATH AND CORONARY ANGIOGRAPHY;  Surgeon: Ladona Heinz, MD;  Location: [facility];  Service: Cardiovascular;  Laterality: N/A;   PARTIAL HYSTERECTOMY     THYROID  LOBECTOMY Left 06/16/2016    THYROID  LOBECTOMY Left 06/16/2016   Procedure: LEFT THYROID  LOBECTOMY;  Surgeon: Camellia Blush, MD;  Location: [facility];  Service: General;  Laterality: Left;   TOTAL HIP ARTHROPLASTY Left 08/03/2021   Procedure: TOTAL HIP ARTHROPLASTY ANTERIOR APPROACH;  Surgeon: Ernie Cough, MD;  Location: [facility];  Service: Orthopedics;  Laterality: Left;   TOTAL HIP ARTHROPLASTY Right 07/05/2022   Procedure: TOTAL HIP ARTHROPLASTY ANTERIOR APPROACH;  Surgeon: Ernie Cough, MD;  Location: [facility];  Service: Orthopedics;  Laterality: Right;    Family History  Problem Relation Age of Onset   Diabetes Mother    Heart disease Mother    Hyperlipidemia Mother    Hypertension Mother    Stroke Mother    Hypertension Father    Parkinson's disease Father    Heart disease Father    Hyperlipidemia Father    Hemachromatosis Daughter    Thyroid  disease Neg Hx    Colon cancer Neg Hx    Esophageal cancer Neg Hx    Inflammatory bowel disease Neg Hx    Liver disease Neg Hx    Pancreatic cancer Neg Hx    Rectal cancer Neg Hx    Stomach cancer Neg Hx    Sleep apnea Neg Hx     Social History Social History   Tobacco Use   Smoking status: Former    Current packs/day: 0.00    Average packs/day: 1 pack/day for 15.0 years (15.0 ttl pk-yrs)    Types: Cigarettes    Start date: 06/29/1964    Quit date: 06/30/1979    Years since quitting: 44.8   Smokeless tobacco: Never  Vaping Use   Vaping status: Never Used  Substance Use Topics   Alcohol use: Not Currently    Alcohol/week: 0.0 - 1.0 standard drinks of alcohol   Drug use: Never    Current Outpatient Medications  Medication Sig Dispense Refill   acetaminophen  (TYLENOL ) 500 MG tablet Take 2 tablets (1,000 mg total) by mouth every 6 (six) hours. (Patient taking differently: Take 1,000 mg by mouth every 6 (six) hours as needed for moderate pain (pain score 4-6).) 30 tablet 0   ALPRAZolam  (XANAX ) 1 MG tablet TAKE 1/2 TO 1 TABLET BY MOUTH twice daily AS NEEDED  FOR ANXIETY 90 tablet 1   amLODipine  (NORVASC ) 10 MG tablet Take 1 tablet (10 mg total) by mouth daily. 90 tablet 1   aspirin  EC 81 MG tablet Take 1 tablet (81 mg total) by mouth daily. Swallow whole. 30 tablet 12   atenolol  (TENORMIN ) 25 MG tablet Take 25 mg by mouth daily.     atorvastatin  (LIPITOR) 80 MG tablet Take 1 tablet (80 mg total) by mouth daily. 90 tablet 3   clopidogrel  (PLAVIX ) 75 MG tablet Take 1 tablet (75 mg total) by mouth daily with breakfast. 90 tablet 3   docusate sodium  (COLACE) 100  MG capsule Take 1 capsule (100 mg total) by mouth 2 (two) times daily. (Patient taking differently: Take 100 mg by mouth daily as needed for moderate constipation.) 10 capsule 0   ezetimibe  (ZETIA ) 10 MG tablet Take 1 tablet (10 mg total) by mouth daily. 90 tablet 3   hydrOXYzine  (ATARAX ) 25 MG tablet TAKE ONE TABLET BY MOUTH every EIGHT hours AS NEEDED 60 tablet 5   isosorbide  mononitrate (IMDUR ) 30 MG 24 hr tablet Take 1 tablet (30 mg total) by mouth daily. 90 tablet 3   lamoTRIgine  (LAMICTAL ) 100 MG tablet TAKE TWO TABLETS BY MOUTH EVERY MORNING and TAKE ONE TABLET BY MOUTH EVERY EVENING 270 tablet 1   levothyroxine  (SYNTHROID ) 100 MCG tablet TAKE 1 TABLET BY MOUTH DAILY BEFORE BREAKFAST 90 tablet 1   losartan  (COZAAR ) 50 MG tablet TAKE 1 TABLET BY MOUTH DAILY 90 tablet 1   nitroGLYCERIN  (NITROSTAT ) 0.4 MG SL tablet Place 0.4 mg under the tongue every 5 (five) minutes as needed for chest pain.     oxyCODONE -acetaminophen  (PERCOCET) 10-325 MG tablet Take 1 tablet by mouth every 4 (four) hours as needed for pain.     pantoprazole  (PROTONIX ) 40 MG tablet Take 1 tablet (40 mg total) by mouth daily at 6 (six) AM. 90 tablet 0   polyethylene glycol (MIRALAX  / GLYCOLAX ) 17 g packet Take 17 g by mouth daily as needed for mild constipation. 14 each 0   QUEtiapine  (SEROQUEL ) 50 MG tablet Take 1 tablet (50 mg total) by mouth daily. 90 tablet 1   traZODone  (DESYREL ) 150 MG tablet TAKE TWO TABLETS BY MOUTH  everyday AT bedtime 180 tablet 1   Vitamin D , Ergocalciferol , (DRISDOL ) 1.25 MG (50000 UNIT) CAPS capsule Take 1 capsule (50,000 Units total) by mouth every 7 (seven) days. Sunday 12 capsule 3   Current Facility-Administered Medications  Medication Dose Route Frequency Provider Last Rate Last Admin   cyanocobalamin  ((VITAMIN B-12)) injection 1,000 mcg  1,000 mcg Intramuscular Q30 days Loreli Elyn SAILOR, MD   1,000 mcg at 07/12/18 1652    No Known Allergies  Review of Systems  Constitutional:  Positive for activity change, appetite change and fatigue.  HENT: Negative.    Eyes: Negative.   Respiratory:  Positive for shortness of breath.   Cardiovascular:  Positive for chest pain. Negative for leg swelling.  Gastrointestinal: Negative.   Endocrine: Negative.   Genitourinary: Negative.   Musculoskeletal:  Positive for arthralgias.  Skin: Negative.   Allergic/Immunologic: Negative.   Neurological:  Negative for dizziness and syncope.  Hematological: Negative.   Psychiatric/Behavioral:         Anxiety and depression, stress related to her elderly cat    BP 132/67   Pulse 82   Resp 20   Ht 5' 4.5 (1.638 m)   Wt 187 lb (84.8 kg)   SpO2 95% Comment: RA  BMI 31.60 kg/m  Physical Exam Constitutional:      Appearance: Normal appearance.  HENT:     Head: Normocephalic and atraumatic.  Eyes:     Extraocular Movements: Extraocular movements intact.     Conjunctiva/sclera: Conjunctivae normal.     Pupils: Pupils are equal, round, and reactive to light.  Neck:     Vascular: No carotid bruit.  Cardiovascular:     Rate and Rhythm: Normal rate and regular rhythm.     Pulses: Normal pulses.     Heart sounds: Normal heart sounds. No murmur heard. Pulmonary:     Effort: Pulmonary effort is  normal.     Breath sounds: Normal breath sounds.  Abdominal:     General: Bowel sounds are normal. There is no distension.     Tenderness: There is no abdominal tenderness.  Musculoskeletal:         General: No swelling.  Skin:    General: Skin is warm and dry.  Neurological:     General: No focal deficit present.     Mental Status: She is alert and oriented to person, place, and time.      Diagnostic Tests:  Physicians  Panel Physicians Referring Physician Case Authorizing Physician  Ladona Heinz, MD (Primary)     Procedures  LEFT HEART CATH AND CORONARY ANGIOGRAPHY  CORONARY STENT INTERVENTION   Conclusion  Cardiac Catheterization 04/03/24: Hemodynamic data: LVEDP 35 mm Hg, Markedly elevated, no pressure gradient across the aortic valve.   Angiographic data: LM: Large-caliber vessel, mildly calcified. LAD: It is a moderate large caliber vessel, proximal LAD has diffuse 30 to 40% calcific stenosis followed by a very focal 95% calcific stenosis at the origin of D2 and is tortuous at this segment.  Distal-apical LAD has a 70% focal stenosis. LCx: Codominant with RCA.  Moderately calcified.  There is a very focal 95% calcific stenosis in the midsegment just proximal to the origin of a very large OM1.  OM1 has a ostial 30 to 40% calcific stenosis. RCA: Codominant with CX, continues as a moderate-sized PL branch with very small PDA, has a focal 90% stenosis at origin of RV branch in the midsegment.   Intervention data: Successful but difficult procedure due to calcification and distal dissection.  PTCA and stenting of the mid CX and OM1 with 2 overlapping 3.5 x 12 in the CX and 3.0 x 12 mm Synergy XD DES overlapping into the OM1, stenosis reduced from 95% to 0% with TIMI-3 to TIMI-3 flow.      Impression and recommendations: Patient has severely complex three-vessel coronary disease and probably would be better suited for CABG.  I attempted to pass the guidewire into the LAD after the CX PCI, and I was unable to pass a 2.5 mm balloon to predilate the lesion.  There was also spasm mid segment Cx and also a small type a dissection was evident postprocedure in the LAD.  I suspect  if she needs PCI to the LAD, it is can be a complex long-term intervention with atherectomy.  RCA lesion is very focal.   I have recommended that we observe her overnight for any potential post PCI complication, discharge in the morning, elective staged intervention to RCA and will take a relook at LAD at that time and consider atherectomy followed by stenting of a focal high-grade mid LAD stenosis avoiding stenting the long proximal segment.   Recommendations  Antiplatelet/Anticoag Recommend uninterrupted dual antiplatelet therapy with Aspirin  81mg  daily and Clopidogrel  75mg  daily for a minimum of 6 months (stable ischemic heart disease-Class I recommendation).   Procedural Details  Technical Details Procedure performed: Left heart catheterization including LV hemodynamics, selective right left coronary arteriogram, PTCA and stenting of the mid circumflex and OM1 branch of circumflex coronary artery with 2 overlapping DES.  Indication: Patient with hypertension, hypercholesterolemia, prior history of tobacco use disorder, markedly elevated coronary calcium  score and presenting with anginal symptoms in spite of 2 antianginal medications, in view of lifestyle limiting claudication, he had undergone outpatient stress test which suggested triple-vessel coronary disease, recommended cardiac catheterization.  Technique: Under sterile precautions using 6 French right radial  arterial access, a 5 [place] Tiger 4.0 diagnostic cath was utilized to perform selective left and right coronary arteriogram and LV hemodynamics.  Using heparin  for anticoagulation, ACT was kept ACT >250 seconds, I utilized 6 [place] XB LAD 3.5 guide catheter to engage the left main coronary artery.  I then utilized run-through guidewire and crossed the circumflex coronary artery into OM1.  The lesion was moderate to heavily calcified, I utilized one-to-one balloon ratio to perform low-pressure inflation to see if the balloon would  adequately expand at low inflation pressure, 3.5 x 15 mm Emerge balloon was utilized and 4 atmospheric pressure inflation was performed revealing complete expansion of the balloon hence I proceeded with implantation of a 3.5 x 12 mm Synergy XD DES, there was distal dissection with dye staining noted hence I covered the dissection as it was antegrade I deployed the stent at 11 atmospheric pressure for 45 seconds and angiography was repeated.  I felt comfortable that distal edge dissection has been covered.  I then directed the attention towards the mid LAD stenosis.  Run-through guidewire was again utilized to cross into the LAD.   In attempting to predilate the lesion with a 2.5 mm balloon, I soon realized that the LAD stenosis is heavily calcified and I was unable to cross the balloon, at this point I realized there was also dissection that was extending into the OM1 lesion and the lesion appeared much more unstable.  I withdrew the wire from the LAD and advanced back into the circumflex coronary artery into the OM1 and placed a 3.0 x 12 mm Synergy XD DES overlapping the previously placed stent distally and deployed the stent at 13 atmospheric pressure and use the same stent balloon to perform postdilatation at 16 atmospheric pressure for 30 seconds.  Complete sealing of the dissection was evident with TIMI-3 flow in the circumflex and OM1.  There was mild dye staining into the proximal LAD when I was trying to withdraw the guidewire back from the LAD insertion, there was suctioning effect of the guide into the LAD leading to a antegrade dissection but with TIMI-3 flow.  With the complexity of her disease, I felt that she should be observed overnight for any potential complications.  She will be discharged home in the morning.  210 mL contrast utilized.   Sedation:   Conscious sedation was administered under my direct supervision. IV Versed  4 mg plus fentanyl   150 mcg were utilized. Continuous ECG, pulse  oximetry and blood pressure was monitored throughout the entire procedure. Conscious sedation was for a total of 73 minutes. I was present during the entire time of patient observation.     Estimated blood loss <50 mL.   During this procedure medications were administered to achieve and maintain moderate conscious sedation while the patient's heart rate, blood pressure, and oxygen saturation were continuously monitored and I was present face-to-face 100% of this time. Logan Boothe Rad Tech and Tabor Years RN are independent, trained observers who assisted in the monitoring of the patient's level of consciousness.   Medications (Filter: Administrations occurring from [phone] to 1122 on 04/03/24) fentaNYL  (SUBLIMAZE ) injection (mcg)  Total dose: 150 mcg Date/Time Rate/Dose/Volume Action   04/03/24 0943 50 mcg Given   Canceled Entry   1010 25 mcg Given   1024 25 mcg Given   1042 50 mcg Given   Heparin  (Porcine) in NaCl 1000-0.9 UT/500ML-% SOLN (mL)  Total volume: 1,500 mL Date/Time Rate/Dose/Volume Action   04/03/24 0948 500 mL  Given   0948 500 mL Given   1033 500 mL Given   midazolam  (VERSED ) injection (mg)  Total dose: 4 mg Date/Time Rate/Dose/Volume Action   04/03/24 0952 2 mg Given   Canceled Entry   1010 1 mg Given   1024 1 mg Given   lidocaine  (PF) (XYLOCAINE ) 1 % injection (mL)  Total volume: 3 mL Date/Time Rate/Dose/Volume Action   04/03/24 1005 3 mL Given   Radial Cocktail/Verapamil  only (mL)  Total volume: 10 mL Date/Time Rate/Dose/Volume Action   04/03/24 1006 10 mL Given   heparin  sodium (porcine) injection (Units)  Total dose: 15,000 Units Date/Time Rate/Dose/Volume Action   04/03/24 1009 5,000 Units Given   1016 5,000 Units Given   1028 3,000 Units Given   1043 2,000 Units Given   clopidogrel  (PLAVIX ) tablet (mg)  Total dose: 600 mg Date/Time Rate/Dose/Volume Action   04/03/24 1037 600 mg Given   hydrALAZINE  (APRESOLINE ) injection (mg)  Total dose: 10  mg Date/Time Rate/Dose/Volume Action   04/03/24 1104 10 mg Given   iohexol  (OMNIPAQUE ) 350 MG/ML injection (mL)  Total volume: 210 mL Date/Time Rate/Dose/Volume Action   04/03/24 1112 210 mL Given    Sedation Time  Sedation Time Physician-1: 1 hour 17 minutes 3 seconds Contrast     Administrations occurring from 0936 to 1122 on 04/03/24:  Medication Name Total Dose  iohexol  (OMNIPAQUE ) 350 MG/ML injection 210 mL   Radiation/Fluoro  Fluoro time: 15.2 (min) DAP: 21.5 (mGycm2) Cumulative Air Kerma: 783 (mGy) Complications  Complications documented before study signed (04/03/2024  5:03 PM)   No complications were associated with this study.  Documented by Years, Leonor BRAVO, RN - 04/03/2024 11:11 AM     Coronary Findings  Diagnostic Dominance: Co-dominant Left Anterior Descending  Prox LAD to Mid LAD lesion is 40% stenosed.  Mid LAD lesion is 95% stenosed.  Dist LAD lesion is 70% stenosed.    Left Circumflex  Prox Cx to Mid Cx lesion is 95% stenosed. The lesion is type C. The lesion is moderately calcified.    First Obtuse Marginal Branch  1st Mrg lesion is 40% stenosed.    Right Coronary Artery  Prox RCA lesion is 90% stenosed.    Intervention   Prox Cx to Mid Cx lesion  Angioplasty  Lesion length: 8 mm. CATH VISTA GUIDE 6FR XBLD 3.5 guide catheter was inserted. WIRE RUNTHROUGH .[id number] guidewire used to cross lesion. Balloon angioplasty was performed using a BALLOON EMERGE MR 3.5X15. Maximum pressure: 4 atm. Inflation time: 30 sec.  Stent  A drug-eluting stent was successfully placed using a STENT SYNERGY XD 3.50X12. Maximum pressure: 13 atm. Inflation time: 45 sec. Stent strut is well apposed.  Post-Intervention Lesion Assessment  The intervention was successful. Pre-interventional TIMI flow is 3. Post-intervention TIMI flow is 3. No complications occurred at this lesion.  There is a 0% residual stenosis post intervention.    1st Mrg lesion  Stent  A  drug-eluting stent was successfully placed using a STENT SYNERGY XD 3.0X12. Maximum pressure: 11 atm. Inflation time: 30 sec. Stent strut is well apposed. Stent overlaps previously placed stent. Post-stent angioplasty was performed. Stent balloon used to post dill overlap at 16 atm for 30 S  Post-Intervention Lesion Assessment  The intervention was successful. Pre-interventional TIMI flow is 3. Post-intervention TIMI flow is 3. At this lesion, a dissection occurred. As I pulled the LAD guide wire, the guide catheter was sucked into the LAD and there was Prox LAD type A dissection  without flow compromise and hence left alone due to complexity of the LAD stenosis.  There is a 0% residual stenosis post intervention.     Left Heart  Left Ventricle LV end diastolic pressure is severely elevated.   Coronary Diagrams  Diagnostic Dominance: Co-dominant  Intervention    Implants   Permanent Stent  Stent Synergy Xd 3.50x12 - [id number] - Implanted  Inventory item: CLEDA OSWALD STAPLER 6.49K87 Model/Cat number: [id number]  Manufacturer: [employer] Lot number: [id number]  Device identifier: [id number] Device identifier type: GS1  Area Of Implantation: Mid CFX    GUDID Information  Request status Successful    Brand name: LORRIN STAPLER Version/Model: [id number]  Company name: [employer] MRI safety info as of 04/03/24: MR Conditional  Contains dry or latex rubber: No    GMDN P.T. name: Drug-eluting coronary artery stent, non-bioabsorbable-polymer-coated    As of 04/03/2024  Status: Implanted    Stent Synergy Xd 3.0x12 - [id number] - Implanted  Inventory item: CLEDA OSWALD XD 3.0X12 Model/Cat number: [id number]  Manufacturer: [employer] Lot number: [id number]  Device identifier: [id number] Device identifier type: GS1  Area Of Implantation: Mid CFX    GUDID Information  Request status Successful    Brand name: LORRIN STAPLER  Version/Model: [id number]  Company name: [employer] MRI safety info as of 04/03/24: MR Conditional  Contains dry or latex rubber: No    GMDN P.T. name: Drug-eluting coronary artery stent, non-bioabsorbable-polymer-coated    As of 04/03/2024  Status: Implanted     Syngo Images   Show images for CARDIAC CATHETERIZATION Images on Long Term Storage   Show images for Valorie, Mcgrory to Procedure Log  Procedure Log    Hemo Data  Flowsheet Row Most Recent Value  AO Systolic Pressure 148 mmHg  AO Diastolic Pressure 75 mmHg  AO Mean 108 mmHg  LV Systolic Pressure 173 mmHg  LV Diastolic Pressure -4 mmHg  LV EDP 35 mmHg  Arterial Occlusion Pressure Extended Systolic Pressure 165 mmHg  Arterial Occlusion Pressure Extended Diastolic Pressure 70 mmHg  Arterial Occlusion Pressure Extended Mean Pressure 109 mmHg  Left Ventricular Apex Extended Systolic Pressure 169 mmHg  LVp Diastolic Pressure 15 mmHg  Left Ventricular Apex Extended EDP Pressure 31 mmHg    Impression:  This 73 year old woman has severe three-vessel coronary disease with recurrent anginal symptoms related to exertional or emotional stress.  She had successful PCI of the left circumflex but still has high-grade stenosis in the LAD and RCA.  I agree that coronary artery bypass graft surgery is the best treatment for resolution of her symptoms and to prevent myocardial loss.  She is currently on aspirin  and Plavix  which she will need to continue for at least 6 months.  I discussed the case with Dr. Ladona and he will coordinate Lovenox bridging while she stops Plavix  5 days preoperatively at home. I discussed the operative procedure with the patient including alternatives, benefits and risks; including but not limited to bleeding, blood transfusion, infection, stroke, myocardial infarction, graft failure, heart block requiring a permanent pacemaker, organ dysfunction, and death.  Niels FORBES Addie understands and  agrees to proceed.    Plan:  She will be scheduled for coronary artery bypass graft surgery on May 17, 2024 which is the soonest date that she would like to proceed with surgery.  Dr. Ladona will coordinate Lovenox bridging while she stopped Plavix  5 days preoperatively.  I spent 60 minutes performing this consultation and > 50%  of this time was spent face to face counseling and coordinating the care of this patient's severe multivessel coronary disease.  Dorise MARLA Fellers, MD [facility] [phone]

## 2024-04-26 NOTE — Telephone Encounter (Signed)
-----   Message from Katlyn D West sent at 04/26/2024 12:26 PM EDT ----- Please let Ms. Stankovich know that her CBC shows no evidence of anemia or infection.  Her kidney function and electrolytes are normal.  Her liver function is normal.  Given her CAD would recommend that  her LDL goal be less than 55, she is currently at 64, recommend starting ezetimibe  10 mg daily in addition to her atorvastatin  80 mg daily.  She will need a fasting lipid profile and LFTs in 6 to 8  weeks. ----- Message ----- From: [name] Lab Results In Sent: 04/24/2024  10:36 PM EDT To: Katlyn D West, NP

## 2024-04-27 ENCOUNTER — Telehealth: Payer: Self-pay | Admitting: Cardiology

## 2024-04-27 ENCOUNTER — Other Ambulatory Visit (HOSPITAL_COMMUNITY): Payer: Self-pay

## 2024-04-27 DIAGNOSIS — Z9861 Coronary angioplasty status: Secondary | ICD-10-CM

## 2024-04-27 DIAGNOSIS — I25118 Atherosclerotic heart disease of native coronary artery with other forms of angina pectoris: Secondary | ICD-10-CM

## 2024-04-27 MED ORDER — ENOXAPARIN SODIUM 80 MG/0.8ML IJ SOSY
80.0000 mg | PREFILLED_SYRINGE | Freq: Two times a day (BID) | INTRAMUSCULAR | 0 refills | Status: DC
  Filled 2024-04-27: qty 6.4, 4d supply, fill #0

## 2024-04-27 NOTE — Telephone Encounter (Signed)
 ICD-10-CM   1. Coronary artery disease of native artery of native heart with stable angina pectoris (HCC)  I25.118 enoxaparin  (LOVENOX ) 80 MG/0.8ML injection    2. Post PTCA  Z98.61 enoxaparin  (LOVENOX ) 80 MG/0.8ML injection     No orders of the defined types were placed in this encounter.   Meds ordered this encounter  Medications   enoxaparin  (LOVENOX ) 80 MG/0.8ML injection    Sig: Inject 0.8 mLs (80 mg total) into the skin every 12 (twelve) hours for 4 days. Start Sunday morning, last dose Wednesday night    Dispense:  6.4 mL    Refill:  0   Stop Plavix  after last dose Saturday on 05/11/24     Gordy Bergamo, MD, [facility] 04/27/2024, 10:44 AM [facility] [address] [place], [place] [address] Phone: [phone]. Fax:  [phone]

## 2024-04-28 ENCOUNTER — Other Ambulatory Visit (HOSPITAL_COMMUNITY): Payer: Self-pay

## 2024-04-29 ENCOUNTER — Other Ambulatory Visit: Payer: Self-pay | Admitting: Family Medicine

## 2024-04-29 ENCOUNTER — Other Ambulatory Visit (HOSPITAL_COMMUNITY): Payer: Self-pay

## 2024-04-29 DIAGNOSIS — I10 Essential (primary) hypertension: Secondary | ICD-10-CM

## 2024-05-03 ENCOUNTER — Telehealth: Payer: Self-pay | Admitting: Cardiology

## 2024-05-03 ENCOUNTER — Other Ambulatory Visit: Payer: Self-pay

## 2024-05-03 MED ORDER — NITROGLYCERIN 0.4 MG SL SUBL: 0.4000 mg | SUBLINGUAL_TABLET | SUBLINGUAL | 3 refills | Status: DC | PRN

## 2024-05-03 NOTE — Telephone Encounter (Signed)
*  STAT* If patient is at the pharmacy, call can be transferred to refill team.   1. Which medications need to be refilled? (please list name of each medication and dose if known) nitroGLYCERIN  (NITROSTAT ) 0.4 MG SL tablet    2. Would you like to learn more about the convenience, safety, & potential cost savings by using the [facility]?      3. Are you open to using the [facility] (Type [facility].  ).   4. Which pharmacy/location (including street and city if local pharmacy) is medication to be sent to?  [facility] - [place], [place] - [address] [place] [address]     5. Do they need a 30 day or 90 day supply? 90 day

## 2024-05-07 ENCOUNTER — Ambulatory Visit: Admitting: Behavioral Health

## 2024-05-07 ENCOUNTER — Encounter: Payer: Self-pay | Admitting: Behavioral Health

## 2024-05-07 DIAGNOSIS — F99 Mental disorder, not otherwise specified: Secondary | ICD-10-CM

## 2024-05-07 DIAGNOSIS — F411 Generalized anxiety disorder: Secondary | ICD-10-CM | POA: Diagnosis not present

## 2024-05-07 DIAGNOSIS — R5383 Other fatigue: Secondary | ICD-10-CM

## 2024-05-07 DIAGNOSIS — F5105 Insomnia due to other mental disorder: Secondary | ICD-10-CM

## 2024-05-07 DIAGNOSIS — F3130 Bipolar disorder, current episode depressed, mild or moderate severity, unspecified: Secondary | ICD-10-CM | POA: Diagnosis not present

## 2024-05-07 NOTE — Progress Notes (Signed)
 [facility]  Patient ID: Becky Lawson,  MRN: [id number]  PCP: Levora Reyes SAUNDERS, MD  Date of Evaluation: 05/07/2024 Time spent:30 minutes  Chief Complaint:  Chief Complaint   Anxiety; Depression; Follow-up; Medication Refill; Patient Education; Fatigue     HISTORY/CURRENT STATUS: HPI  Becky Lawson, 73 year old female  presents for follow-up and medication management. No manic episodes since last visit.  Continue to present with good stability. Says that she was recently at the beach and felt chest pain on exertion while walking. Says she waited until she got back home to seek care. She reports cardiac cath performed with stent placement. She has two blocked arteries remaining and will have open heart surgery surgery on September 12 th.  Says her anxiety today is 4/10 and depression is 3/10. She was sleeping  7 hours per night with aid of medication. She denies mania, no psychosis, NO SI/HI.   Past medications for mental health diagnoses include: Lithium , Seroquel , Effexor, Prozac, Latuda , Abilify, Dexedrine , Cymbalta  caused weight gain.  Modafinil .    Individual Medical History/ Review of Systems: Changes? :No   Allergies: Patient has no known allergies.  Current Medications:  Current Outpatient Medications:    acetaminophen  (TYLENOL ) 500 MG tablet, Take 2 tablets (1,000 mg total) by mouth every 6 (six) hours. (Patient taking differently: Take 1,000 mg by mouth every 6 (six) hours as needed for moderate pain (pain score 4-6).), Disp: 30 tablet, Rfl: 0   ALPRAZolam  (XANAX ) 1 MG tablet, TAKE 1/2 TO 1 TABLET BY MOUTH twice daily AS NEEDED FOR ANXIETY, Disp: 90 tablet, Rfl: 1   amLODipine  (NORVASC ) 10 MG tablet, Take 1 tablet (10 mg total) by mouth daily., Disp: 90 tablet, Rfl: 1   aspirin  EC 81 MG tablet, Take 1 tablet (81 mg total) by mouth daily. Swallow whole., Disp: 30 tablet, Rfl: 12   atenolol  (TENORMIN ) 25 MG tablet, Take 25 mg by mouth daily., Disp: , Rfl:     atorvastatin  (LIPITOR) 80 MG tablet, Take 1 tablet (80 mg total) by mouth daily., Disp: 90 tablet, Rfl: 3   clopidogrel  (PLAVIX ) 75 MG tablet, Take 1 tablet (75 mg total) by mouth daily with breakfast., Disp: 90 tablet, Rfl: 3   docusate sodium  (COLACE) 100 MG capsule, Take 1 capsule (100 mg total) by mouth 2 (two) times daily. (Patient taking differently: Take 100 mg by mouth daily as needed for moderate constipation.), Disp: 10 capsule, Rfl: 0   [START ON 05/12/2024] enoxaparin  (LOVENOX ) 80 MG/0.8ML injection, Inject 0.8 mLs (80 mg total) into the skin every 12 (twelve) hours for 4 days. Start Sunday morning, last dose Wednesday night, Disp: 6.4 mL, Rfl: 0   ezetimibe  (ZETIA ) 10 MG tablet, Take 1 tablet (10 mg total) by mouth daily., Disp: 90 tablet, Rfl: 3   hydrOXYzine  (ATARAX ) 25 MG tablet, TAKE ONE TABLET BY MOUTH every EIGHT hours AS NEEDED, Disp: 60 tablet, Rfl: 5   isosorbide  mononitrate (IMDUR ) 30 MG 24 hr tablet, Take 1 tablet (30 mg total) by mouth daily., Disp: 90 tablet, Rfl: 3   lamoTRIgine  (LAMICTAL ) 100 MG tablet, TAKE TWO TABLETS BY MOUTH EVERY MORNING and TAKE ONE TABLET BY MOUTH EVERY EVENING, Disp: 270 tablet, Rfl: 1   levothyroxine  (SYNTHROID ) 100 MCG tablet, TAKE 1 TABLET BY MOUTH DAILY BEFORE BREAKFAST, Disp: 90 tablet, Rfl: 1   losartan  (COZAAR ) 50 MG tablet, TAKE 1 TABLET BY MOUTH DAILY, Disp: 90 tablet, Rfl: 1   nitroGLYCERIN  (NITROSTAT ) 0.4 MG SL tablet,  Place 1 tablet (0.4 mg total) under the tongue every 5 (five) minutes as needed for chest pain., Disp: 25 tablet, Rfl: 3   oxyCODONE -acetaminophen  (PERCOCET) 10-325 MG tablet, Take 1 tablet by mouth every 4 (four) hours as needed for pain., Disp: , Rfl:    pantoprazole  (PROTONIX ) 40 MG tablet, Take 1 tablet (40 mg total) by mouth daily at 6 (six) AM., Disp: 90 tablet, Rfl: 0   polyethylene glycol (MIRALAX  / GLYCOLAX ) 17 g packet, Take 17 g by mouth daily as needed for mild constipation., Disp: 14 each, Rfl: 0   QUEtiapine   (SEROQUEL ) 50 MG tablet, Take 1 tablet (50 mg total) by mouth daily., Disp: 90 tablet, Rfl: 1   traZODone  (DESYREL ) 150 MG tablet, TAKE TWO TABLETS BY MOUTH everyday AT bedtime, Disp: 180 tablet, Rfl: 1   Vitamin D , Ergocalciferol , (DRISDOL ) 1.25 MG (50000 UNIT) CAPS capsule, Take 1 capsule (50,000 Units total) by mouth every 7 (seven) days. Sunday, Disp: 12 capsule, Rfl: 3  Current Facility-Administered Medications:    cyanocobalamin  ((VITAMIN B-12)) injection 1,000 mcg, 1,000 mcg, Intramuscular, Q30 days, Loreli Elyn SAILOR, MD, 1,000 mcg at 07/12/18 1652 Medication Side Effects: none  Family Medical/ Social History: Changes? No  MENTAL HEALTH EXAM:  There were no vitals taken for this visit.There is no height or weight on file to calculate BMI.  General Appearance: Casual, Neat, and Well Groomed  Eye Contact:  Good  Speech:  Clear and Coherent  Volume:  Normal  Mood:  NA  Affect:  Appropriate  Thought Process:  Coherent  Orientation:  Full (Time, Place, and Person)  Thought Content: Logical   Suicidal Thoughts:  No  Homicidal Thoughts:  No  Memory:  WNL  Judgement:  Good  Insight:  Good  Psychomotor Activity:  Normal  Concentration:  Concentration: Good  Recall:  Good  Fund of Knowledge: Good  Language: Good  Assets:  Desire for Improvement  ADL's:  Intact  Cognition: WNL  Prognosis:  Good    DIAGNOSES:    ICD-10-CM   1. Bipolar I disorder, most recent episode depressed (HCC)  F31.30     2. Generalized anxiety disorder  F41.1     3. Fatigue, unspecified type  R53.83     4. Insomnia due to other mental disorder  F51.05    F99       Receiving Psychotherapy: No    RECOMMENDATIONS:   Greater than 50% of 30 min of face to face time  was spent on counseling and coordination of care. Discussed her continued stability. She request no medication changes this visit. We discussed her upcoming open heart surgery pending on Sept. 12.  We agreed that no medication changes  necessary this visit. We will follow up after she has had some time to recover.  No questions or concerns today.    -Conducted AIMS  Score of 3 today.  05/07/2024 Continue Seroquel  50 mg daily at bedtime Continue hydroxyzine  25 mg nightly 8H as needed. Continue Lamictal   300 mg daily. 200 mg in the am and 100 mg in the evening. Continue the  trazodone  300 mg, 1.5 pills nightly. Continue Mirtazapine  7.5 mg at bedtime. May increase to 15 mg if needed.  Continue Xanax  1 mg at bedtime but if taking  Pt to schedule follow up after cardiology  further assesses Discussed potential benefits, risks, and side effects of stimulants with patient to include increased heart rate, palpitations, insomnia, increased anxiety, increased irritability, or decreased appetite.  Instructed patient  to contact office if experiencing any significant tolerability issues.  Discussed potential metabolic side effects associated with atypical antipsychotics, as well as potential risk for movement side effects. Advised pt to contact office if movement side effects occur.   PDMP was reviewed     Redell DELENA Pizza, NP

## 2024-05-14 ENCOUNTER — Other Ambulatory Visit: Payer: Self-pay | Admitting: Family Medicine

## 2024-05-14 DIAGNOSIS — I10 Essential (primary) hypertension: Secondary | ICD-10-CM

## 2024-05-14 NOTE — Progress Notes (Signed)
 Surgical Instructions   Your procedure is scheduled on Friday, September 12th, 2025. Report to [facility] Main Entrance A at 5:30 A.M., then check in with the Admitting office. Any questions or running late day of surgery: call [phone]  Questions prior to your surgery date: call [phone], Monday-Friday, 8am-4pm. If you experience any cold or flu symptoms such as cough, fever, chills, shortness of breath, etc. between now and your scheduled surgery, please notify us  at the above number.     Remember:  Do not eat or drink after midnight the night before your surgery    Take these medicines the morning of surgery with A SIP OF WATER : Amlodipine  (Norvasc ) Atenolol  (Tenormin ) Ezetimibe  (Zetia ) Isosorbide  Mononitrate (Imdur ) Lamotrigine  (Lamictal ) Levothyroxine  (Synthroid )    May take these medicines IF NEEDED: Hydroxyzine  (Atarax ) Nitroglycerin  - if needed to take, please call [phone] after taking Oxycodone -acetaminophen  (Percocet)   Do NOT take Aspirin  on the morning of surgery.    Per your surgeon's instructions, Clopidogrel  (Plavix ) should be held for 5 days prior to surgery.  Last dose should be on Sunday, September 7th.   Follow your surgeon's instructions for Enoxaparin  (Lovenox ).      One week prior to surgery, STOP taking any Aleve, Naproxen, Ibuprofen , Motrin , Advil , Goody's, BC's, all herbal medications, fish oil, and non-prescription vitamins.                     Do NOT Smoke (Tobacco/Vaping) for 24 hours prior to your procedure.  If you use a CPAP at night, you may bring your mask/headgear for your overnight stay.   You will be asked to remove any contacts, glasses, piercing's, hearing aid's, dentures/partials prior to surgery. Please bring cases for these items if needed.    Patients discharged the day of surgery will not be allowed to drive home, and someone needs to stay with them for 24 hours.  SURGICAL WAITING ROOM VISITATION Patients  may have no more than 2 support people in the waiting area - these visitors may rotate.   Pre-op nurse will coordinate an appropriate time for 1 ADULT support person, who may not rotate, to accompany patient in pre-op.  Children under the age of 66 must have an adult with them who is not the patient and must remain in the main waiting area with an adult.  If the patient needs to stay at the hospital during part of their recovery, the visitor guidelines for inpatient rooms apply.  Please refer to the [facility] website for the visitor guidelines for any additional information.   If you received a COVID test during your pre-op visit  it is requested that you wear a mask when out in public, stay away from anyone that may not be feeling well and notify your surgeon if you develop symptoms. If you have been in contact with anyone that has tested positive in the last 10 days please notify you surgeon.      Pre-operative CHG Bathing Instructions   You can play a key role in reducing the risk of infection after surgery. Your skin needs to be as free of germs as possible. You can reduce the number of germs on your skin by washing with CHG (chlorhexidine  gluconate) soap before surgery. CHG is an antiseptic soap that kills germs and continues to kill germs even after washing.   DO NOT use if you have an allergy to chlorhexidine /CHG or antibacterial soaps. If your skin becomes reddened or irritated, stop using the  CHG and notify one of our RNs at [phone].              TAKE A SHOWER THE NIGHT BEFORE SURGERY AND THE DAY OF SURGERY    Please keep in mind the following:  DO NOT shave, including legs and underarms, 48 hours prior to surgery.   You may shave your face before/day of surgery.  Place clean sheets on your bed the night before surgery Use a clean washcloth (not used since being washed) for each shower. DO NOT sleep with pet's night before surgery.  CHG Shower Instructions:  Wash your  face and private area with normal soap. If you choose to wash your hair, wash first with your normal shampoo.  After you use shampoo/soap, rinse your hair and body thoroughly to remove shampoo/soap residue.  Turn the water  OFF and apply half the bottle of CHG soap to a CLEAN washcloth.  Apply CHG soap ONLY FROM YOUR NECK DOWN TO YOUR TOES (washing for 3-5 minutes)  DO NOT use CHG soap on face, private areas, open wounds, or sores.  Pay special attention to the area where your surgery is being performed.  If you are having back surgery, having someone wash your back for you may be helpful. Wait 2 minutes after CHG soap is applied, then you may rinse off the CHG soap.  Pat dry with a clean towel  Put on clean pajamas    Additional instructions for the day of surgery: DO NOT APPLY any lotions, deodorants, cologne, or perfumes.   Do not wear jewelry or makeup Do not wear nail polish, gel polish, artificial nails, or any other type of covering on natural nails (fingers and toes) Do not bring valuables to the hospital. [facility] is not responsible for valuables/personal belongings. Put on clean/comfortable clothes.  Please brush your teeth.  Ask your nurse before applying any prescription medications to the skin.

## 2024-05-15 ENCOUNTER — Encounter: Payer: Self-pay | Admitting: Family Medicine

## 2024-05-15 ENCOUNTER — Encounter (HOSPITAL_COMMUNITY)
Admission: RE | Admit: 2024-05-15 | Discharge: 2024-05-15 | Disposition: A | Discharge status: Home / Self Care | Source: Ambulatory Visit | Attending: Surgery | Admitting: Surgery

## 2024-05-15 ENCOUNTER — Encounter (HOSPITAL_COMMUNITY): Payer: Self-pay

## 2024-05-15 ENCOUNTER — Other Ambulatory Visit: Payer: Self-pay

## 2024-05-15 ENCOUNTER — Ambulatory Visit (HOSPITAL_COMMUNITY)
Admission: RE | Admit: 2024-05-15 | Discharge: 2024-05-15 | Disposition: A | Discharge status: Home / Self Care | Source: Ambulatory Visit | Attending: Surgery | Admitting: Surgery

## 2024-05-15 ENCOUNTER — Ambulatory Visit (HOSPITAL_BASED_OUTPATIENT_CLINIC_OR_DEPARTMENT_OTHER)
Admission: RE | Admit: 2024-05-15 | Discharge: 2024-05-15 | Disposition: A | Discharge status: Home / Self Care | Source: Ambulatory Visit | Attending: Surgery | Admitting: Surgery

## 2024-05-15 ENCOUNTER — Ambulatory Visit (INDEPENDENT_AMBULATORY_CARE_PROVIDER_SITE_OTHER): Admitting: Family Medicine

## 2024-05-15 VITALS — BP 129/70 | HR 72 | Temp 98.6°F | Resp 18 | Ht 64.5 in | Wt 190.2 lb

## 2024-05-15 VITALS — BP 132/68 | HR 76 | Temp 98.4°F | Resp 19 | Ht 64.5 in | Wt 190.2 lb

## 2024-05-15 DIAGNOSIS — Z01818 Encounter for other preprocedural examination: Secondary | ICD-10-CM | POA: Diagnosis not present

## 2024-05-15 DIAGNOSIS — I2510 Atherosclerotic heart disease of native coronary artery without angina pectoris: Secondary | ICD-10-CM | POA: Insufficient documentation

## 2024-05-15 DIAGNOSIS — E890 Postprocedural hypothyroidism: Secondary | ICD-10-CM

## 2024-05-15 DIAGNOSIS — I10 Essential (primary) hypertension: Secondary | ICD-10-CM

## 2024-05-15 DIAGNOSIS — R7989 Other specified abnormal findings of blood chemistry: Secondary | ICD-10-CM | POA: Insufficient documentation

## 2024-05-15 LAB — APTT: aPTT: 39 s — ABNORMAL HIGH (ref 24–36)

## 2024-05-15 LAB — CBC
HCT: 38.8 % (ref 36.0–46.0)
Hemoglobin: 12.6 g/dL (ref 12.0–15.0)
MCH: 30.7 pg (ref 26.0–34.0)
MCHC: 32.5 g/dL (ref 30.0–36.0)
MCV: 94.4 fL (ref 80.0–100.0)
Platelets: 248 K/uL (ref 150–400)
RBC: 4.11 MIL/uL (ref 3.87–5.11)
RDW: 12.7 % (ref 11.5–15.5)
WBC: 6.7 K/uL (ref 4.0–10.5)
nRBC: 0 % (ref 0.0–0.2)

## 2024-05-15 LAB — COMPREHENSIVE METABOLIC PANEL WITH GFR
ALT: 13 U/L (ref 0–44)
AST: 19 U/L (ref 15–41)
Albumin: 3.9 g/dL (ref 3.5–5.0)
Alkaline Phosphatase: 61 U/L (ref 38–126)
Anion gap: 9 (ref 5–15)
BUN: 11 mg/dL (ref 8–23)
CO2: 23 mmol/L (ref 22–32)
Calcium: 9 mg/dL (ref 8.9–10.3)
Chloride: 107 mmol/L (ref 98–111)
Creatinine, Ser: 0.95 mg/dL (ref 0.44–1.00)
GFR, Estimated: >60 mL/min (ref >60)
Glucose, Bld: 119 mg/dL — ABNORMAL HIGH (ref 70–99)
Potassium: 4.3 mmol/L (ref 3.5–5.1)
Sodium: 139 mmol/L (ref 135–145)
Total Bilirubin: 0.4 mg/dL (ref 0.0–1.2)
Total Protein: 6.4 g/dL — ABNORMAL LOW (ref 6.5–8.1)

## 2024-05-15 LAB — HEMOGLOBIN A1C
Hgb A1c MFr Bld: 5.2 % (ref 4.8–5.6)
Mean Plasma Glucose: 102.54 mg/dL

## 2024-05-15 LAB — URINALYSIS, ROUTINE W REFLEX MICROSCOPIC
Bacteria, UA: NONE SEEN
Bilirubin Urine: NEGATIVE
Glucose, UA: NEGATIVE mg/dL
Ketones, ur: NEGATIVE mg/dL
Nitrite: NEGATIVE
Protein, ur: NEGATIVE mg/dL
Specific Gravity, Urine: 1.017 (ref 1.005–1.030)
pH: 5 (ref 5.0–8.0)

## 2024-05-15 LAB — VAS US DOPPLER PRE CABG

## 2024-05-15 LAB — PROTIME-INR
INR: 1 (ref 0.8–1.2)
Prothrombin Time: 14.2 s (ref 11.4–15.2)

## 2024-05-15 LAB — SURGICAL PCR SCREEN
MRSA, PCR: NEGATIVE
Staphylococcus aureus: POSITIVE — AB

## 2024-05-15 NOTE — Progress Notes (Signed)
 PCP - Dr. Reyes Pines Cardiologist - Dr. Gordy Bergamo LOV on 04-23-24 w/ Katlyn West NP  PPM/ICD - Denies Device Orders - n/a Rep Notified - n/a  Chest x-ray - 05-15-24 EKG - 04-23-24 Stress Test - 03-27-24 ECHO - 11-08-23 Cardiac Cath - 04-03-24  Sleep Study - Yes CPAP - Does not wear all the time.  NON-diabetic  Last dose of GLP1 agonist-  Denies GLP1 instructions: n/a  Blood Thinner Instructions: Lovenox  LD on 05-12-24 in evening. Plavix  LD on 05-11-24. Aspirin  Instructions: per patient said she was to keep taking and not take on day of surgery.  ERAS Protcol - NPO PRE-SURGERY Ensure or G2- n/a  COVID TEST- n/a   Anesthesia review: Yes, HTN, CKD, sleep apnea  Patient denies shortness of breath, fever, cough and chest pain at PAT appointment. Patient denies any respiratory issues at this time.    All instructions explained to the patient, with a verbal understanding of the material. Patient agrees to go over the instructions while at home for a better understanding. Patient also instructed to self quarantine after being tested for COVID-19. The opportunity to ask questions was provided.

## 2024-05-15 NOTE — Progress Notes (Signed)
 Subjective:  Patient ID: Becky Lawson, female    DOB: July 30, 1951  Age: 73 y.o. MRN: [id number]  CC:  Chief Complaint  Patient presents with   Hypertension    Patient is having bypass surgery on friday    HPI Becky Lawson presents for   Cardiac, CAD, hypertension. multivessel CAD - followed by cardiology and then evaluated by Dr. Lucas on August 20.  Cardiac catheterization end of July showing severe three-vessel disease, PCI with DES to the left circumflex that was complicated by dissection requiring stenting into the first marginal branch.  Also significant disease in the LAD and RCA and discharged on aspirin  and Plavix  at that time.  Plan for CABG in 2 days - plan for 2 vessel bypass.  Prior on Plavix , now on Lovenox  (plavix  held 5 days prior to surgery, ASA to be held day of surgery per notes from preop today), aspirin , on statin with Lipitor 80 mg daily, Zetia  10 mg daily.  Hypertension treated with amlodipine  10 mg daily, losartan  50 mg daily.  Started on Imdur  30 mg daily at her August 19 cardiology visit. Transferred primary cardiology care to Dr. Ladona.  BP Readings from Last 3 Encounters:  05/15/24 132/68  05/15/24 129/70  04/24/24 132/67   Hypothyroidism: Lab Results  Component Value Date   TSH 3.100 03/28/2024  Recent stable testing as above.  Continues on Synthroid  100 mcg daily.  Vitamin D  deficiency Treated with 50,000 unit supplement weekly, stable on last testing. Last vitamin D  Lab Results  Component Value Date   VD25OH 46.09 05/12/2023   Psychiatric Followed by Redell Pizza, NP, on trazodone , Seroquel , Lamictal , hydroxyzine , Alprazolam  as needed.   Bloodwork today for preop labs performed. Noted. Normal CBC, A1c, other lytes noted.   History Patient Active Problem List   Diagnosis Date Noted   Abnormal LFTs 05/15/2024   Post PTCA 04/03/2024   Elevated coronary artery calcium  score 12/25/2023   Diastolic dysfunction 12/25/2023   Closed fracture of  fifth metatarsal bone 04/21/2023   Lumbar radiculopathy 11/24/2022   Malignant tumor of thyroid  gland (HCC) 10/26/2022   Vaginal lesion 09/30/2022   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/05/2022   Status post total replacement of right hip 07/05/2022   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   S/P total left hip arthroplasty 08/03/2021   Abnormal liver function tests 04/19/2021   Degeneration of lumbar intervertebral disc 05/25/2020   Tremor 07/12/2018   Prediabetes 07/12/2018   Polypharmacy 07/12/2018   Pain of right hip joint 01/02/2018   Fatty liver 04/18/2017   GERD (gastroesophageal reflux disease) 10/28/2016   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   Chronic throat clearing 06/24/2016   S/P partial thyroidectomy 06/16/2016   Thyroid  nodule 02/08/2016   Hx of hepatitis C 05/19/2015   Vitamin D  deficiency 05/19/2015   Spinal stenosis of lumbar region 05/19/2015   Hyperlipidemia 05/19/2015   Bipolar disorder (HCC) 05/19/2015   Essential hypertension 09/26/2013   Undiagnosed cardiac murmurs 09/26/2013   Past Medical History:  Diagnosis Date   Anxiety    Arthritis    Benign cyst of right kidney 2014   2.7 cm right kidney cyst seen on imaging 2014 but was c/o right flank pain with new persistent proteinuria (fortunately hematuria had resolved) so repeat US  11/2016 showed right kidney still with 2.6 cm simple cyst and otherwise nml.   Bipolar 1 disorder (HCC)    Cancer (HCC)    Phreesia 06/15/2020   Chronic kidney disease  Depression    Depression    [employer]reesia 06/15/2020   GERD (gastroesophageal reflux disease)    Heart murmur    Hepatitis C    resolved completely after treatment in 2014, genotype 1b, followed at [facility]   Hyperlipidemia    Hypertension    Hypothyroidism    Jaundice 11/01/2012   Pill dysphagia 10/20/2021   Pruritic disorder 02/13/2013   Sleep apnea    Was diagnosed approximately 20 years ago, but does not wear CPAP patient stated I gave it  back.SABRASABRAI couldn't wear that thing it was horrible   Spinal stenosis    Thyroid  disease    [employer]reesia 06/15/2020   Unintentional weight loss of more than 10 pounds 07/12/2018   Past Surgical History:  Procedure Laterality Date   ABDOMINAL HYSTERECTOMY     APPENDECTOMY  Approx 1974   COLONOSCOPY W/ POLYPECTOMY     CORONARY STENT INTERVENTION N/A 04/03/2024   Procedure: CORONARY STENT INTERVENTION;  Surgeon: Ladona Heinz, MD;  Location: [facility];  Service: Cardiovascular;  Laterality: N/A;   JOINT REPLACEMENT Bilateral    knee   KNEE ARTHROSCOPY Bilateral    LEFT HEART CATH AND CORONARY ANGIOGRAPHY N/A 04/03/2024   Procedure: LEFT HEART CATH AND CORONARY ANGIOGRAPHY;  Surgeon: Ladona Heinz, MD;  Location: [facility];  Service: Cardiovascular;  Laterality: N/A;   PARTIAL HYSTERECTOMY     THYROID  LOBECTOMY Left 06/16/2016   THYROID  LOBECTOMY Left 06/16/2016   Procedure: LEFT THYROID  LOBECTOMY;  Surgeon: Camellia Blush, MD;  Location: [facility];  Service: General;  Laterality: Left;   TOTAL HIP ARTHROPLASTY Left 08/03/2021   Procedure: TOTAL HIP ARTHROPLASTY ANTERIOR APPROACH;  Surgeon: Ernie Cough, MD;  Location: [facility] ORS;  Service: Orthopedics;  Laterality: Left;   TOTAL HIP ARTHROPLASTY Right 07/05/2022   Procedure: TOTAL HIP ARTHROPLASTY ANTERIOR APPROACH;  Surgeon: Ernie Cough, MD;  Location: [facility] OR[facility];  Service: Orthopedics;  Laterality: Right;   TUBAL LIGATION  1974   No Known Allergies Prior to Admission medications   Medication Sig Start Date End Date Taking? Authorizing Provider  ALPRAZolam  (XANAX ) 1 MG tablet TAKE 1/2 TO 1 TABLET BY MOUTH twice daily AS NEEDED FOR ANXIETY Patient taking differently: Take 1 mg by mouth at bedtime. 03/13/24  Yes Teresa Rogue A, NP  amLODipine  (NORVASC ) 10 MG tablet TAKE 1 TABLET BY MOUTH EVERY DAY 05/14/24  Yes Levora Reyes SAUNDERS, MD  aspirin  EC 81 MG tablet Take 1 tablet (81 mg total) by mouth daily. Swallow whole. 10/27/23  Yes Branch, Ronal BRAVO,  MD  atenolol  (TENORMIN ) 25 MG tablet Take 25 mg by mouth in the morning.   Yes [provider]  clopidogrel  (PLAVIX ) 75 MG tablet Take 1 tablet (75 mg total) by mouth daily with breakfast. 04/23/24  Yes West, Katlyn D, NP  docusate sodium  (COLACE) 100 MG capsule Take 1 capsule (100 mg total) by mouth 2 (two) times daily. Patient taking differently: Take 100 mg by mouth at bedtime. 08/04/21  Yes Patti Rosina SAUNDERS, PA-C  enoxaparin  (LOVENOX ) 80 MG/0.8ML injection Inject 0.8 mLs (80 mg total) into the skin every 12 (twelve) hours for 4 days. Start Sunday morning, last dose Wednesday night 05/12/24 05/16/24 Yes Ladona Heinz, MD  ezetimibe  (ZETIA ) 10 MG tablet Take 1 tablet (10 mg total) by mouth daily. 04/23/24  Yes West, Katlyn D, NP  hydrOXYzine  (ATARAX ) 25 MG tablet TAKE ONE TABLET BY MOUTH every EIGHT hours AS NEEDED 10/24/22  Yes Teresa Rogue LABOR, NP  isosorbide   mononitrate (IMDUR ) 30 MG 24 hr tablet Take 1 tablet (30 mg total) by mouth daily. 04/23/24  Yes West, Katlyn D, NP  lamoTRIgine  (LAMICTAL ) 100 MG tablet TAKE TWO TABLETS BY MOUTH EVERY MORNING and TAKE ONE TABLET BY MOUTH EVERY EVENING 03/13/24  Yes Teresa Rogue A, NP  levothyroxine  (SYNTHROID ) 100 MCG tablet TAKE 1 TABLET BY MOUTH DAILY BEFORE BREAKFAST 12/19/23  Yes Levora Reyes SAUNDERS, MD  losartan  (COZAAR ) 50 MG tablet TAKE 1 TABLET BY MOUTH DAILY 04/29/24  Yes Levora Reyes SAUNDERS, MD  nitroGLYCERIN  (NITROSTAT ) 0.4 MG SL tablet Place 1 tablet (0.4 mg total) under the tongue every 5 (five) minutes as needed for chest pain. 05/03/24  Yes Court Dorn PARAS, MD  oxyCODONE -acetaminophen  (PERCOCET) 10-325 MG tablet Take 1 tablet by mouth every 6 (six) hours as needed for pain.   Yes [provider]  pantoprazole  (PROTONIX ) 40 MG tablet Take 1 tablet (40 mg total) by mouth daily at 6 (six) AM. Patient taking differently: Take 40 mg by mouth at bedtime. 04/05/24  Yes Henry Shaver B, NP  polyethylene glycol (MIRALAX  / GLYCOLAX ) 17 g packet Take  17 g by mouth daily as needed for mild constipation. 08/04/21  Yes Patti Rosina SAUNDERS, PA-C  QUEtiapine  (SEROQUEL ) 50 MG tablet Take 1 tablet (50 mg total) by mouth daily. Patient taking differently: Take 50 mg by mouth at bedtime. 03/13/24  Yes Teresa Rogue LABOR, NP  traZODone  (DESYREL ) 150 MG tablet TAKE TWO TABLETS BY MOUTH everyday AT bedtime 03/13/24  Yes White, Brian A, NP  Vitamin D , Ergocalciferol , (DRISDOL ) 1.25 MG (50000 UNIT) CAPS capsule Take 1 capsule (50,000 Units total) by mouth every 7 (seven) days. Sunday Patient taking differently: Take 50,000 Units by mouth every Sunday. 05/10/23  Yes Levora Reyes SAUNDERS, MD  atorvastatin  (LIPITOR) 80 MG tablet Take 1 tablet (80 mg total) by mouth daily. Patient not taking: Reported on 05/15/2024 10/18/23   Alvan Ronal BRAVO, MD   Social History   Socioeconomic History   Marital status: Divorced    Spouse name: Not on file   Number of children: Not on file   Years of education: Not on file   Highest education level: Associate degree: occupational, Scientist, product/process development, or vocational program  Occupational History   Occupation: Personal Caregiver  Tobacco Use   Smoking status: Former    Current packs/day: 0.00    Average packs/day: 1 pack/day for 15.0 years (15.0 ttl pk-yrs)    Types: Cigarettes    Start date: 06/29/1964    Quit date: 06/30/1979    Years since quitting: 44.9   Smokeless tobacco: Never  Vaping Use   Vaping status: Never Used  Substance and Sexual Activity   Alcohol use: Not Currently    Alcohol/week: 0.0 - 1.0 standard drinks of alcohol   Drug use: Never   Sexual activity: Not Currently    Birth control/protection: None  Other Topics Concern   Not on file  Social History Narrative   Divorced. Education: [employer]. Exercise: No.   Left handed   Lives alone   Caffeine: 1/4-1/2 cup/day   Social Drivers of Corporate investment banker Strain: Low Risk  (05/14/2024)   Overall Financial Resource Strain (CARDIA)    Difficulty of Paying Living  Expenses: Not very hard  Food Insecurity: No Food Insecurity (05/14/2024)   Hunger Vital Sign    Worried About Running Out of Food in the Last Year: Never true    Ran Out of Food in the Last Year:  Never true  Transportation Needs: No Transportation Needs (05/14/2024)   PRAPARE - Administrator, Civil Service (Medical): No    Lack of Transportation (Non-Medical): No  Physical Activity: Sufficiently Active (05/14/2024)   Exercise Vital Sign    Days of Exercise per Week: 3 days    Minutes of Exercise per Session: 60 min  Recent Concern: Physical Activity - Insufficiently Active (03/19/2024)   Exercise Vital Sign    Days of Exercise per Week: 2 days    Minutes of Exercise per Session: 50 min  Stress: No Stress Concern Present (05/14/2024)   [employer] of Occupational Health - Occupational Stress Questionnaire    Feeling of Stress: Only a little  Recent Concern: Stress - Stress Concern Present (03/19/2024)   [employer] of Occupational Health - Occupational Stress Questionnaire    Feeling of Stress: To some extent  Social Connections: Moderately Integrated (05/14/2024)   Social Connection and Isolation Panel    Frequency of Communication with Friends and Family: More than three times a week    Frequency of Social Gatherings with Friends and Family: Three times a week    Attends Religious Services: More than 4 times per year    Active Member of Clubs or Organizations: Yes    Attends Banker Meetings: Not on file    Marital Status: Divorced  Intimate Partner Violence: Not At Risk (04/03/2024)   Humiliation, Afraid, Rape, and Kick questionnaire    Fear of Current or Ex-Partner: No    Emotionally Abused: No    Physically Abused: No    Sexually Abused: No    Review of Systems Per HPI.  Objective:   Vitals:   05/15/24 1528  BP: 132/68  Pulse: 76  Resp: 19  Temp: 98.4 F (36.9 C)  TempSrc: Temporal  SpO2: 96%  Weight: 190 lb 3.2 oz (86.3 kg)  Height:  5' 4.5 (1.638 m)     Physical Exam Vitals reviewed.  Constitutional:      Appearance: Normal appearance. She is well-developed.  HENT:     Head: Normocephalic and atraumatic.  Eyes:     Conjunctiva/sclera: Conjunctivae normal.     Pupils: Pupils are equal, round, and reactive to light.  Neck:     Vascular: No carotid bruit.  Cardiovascular:     Rate and Rhythm: Normal rate and regular rhythm.     Heart sounds: Normal heart sounds.  Pulmonary:     Effort: Pulmonary effort is normal.     Breath sounds: Normal breath sounds.  Abdominal:     Palpations: Abdomen is soft. There is no pulsatile mass.     Tenderness: There is no abdominal tenderness.  Musculoskeletal:     Right lower leg: No edema.     Left lower leg: No edema.  Skin:    General: Skin is warm and dry.  Neurological:     Mental Status: She is alert and oriented to person, place, and time.  Psychiatric:        Mood and Affect: Mood normal.        Behavior: Behavior normal.        Assessment & Plan:  Becky Lawson is a 73 y.o. female . Post-surgical hypothyroidism  - Tolerating current med regimen, recent TSH noted.  No changes for now.  Essential hypertension  - Stable with current regimen and tolerating recent addition of isosorbide .  No med changes, labs noted from earlier today for preop labs.  Will hold  on further labs at this time, recheck in 3 months, consider repeat TSH, vitamin D  level at that time.  Coronary artery disease involving native heart, unspecified vessel or lesion type, unspecified whether angina present  - Plan for coronary artery bypass grafting in 2 days.  Reiterated instructions from preop eval earlier today.  Continue follow-up with specialists as planned.  No orders of the defined types were placed in this encounter.  Patient Instructions  Thank you for coming in today. No change in medications at this time.  Continue plan as discussed earlier today for your preop visit regarding  the aspirin  and other meds prior to surgery.  Will be thinking about you on Friday.  Follow-up me in 3 months and we can catch up on meds further at that time and check updated labs for vitamin D , other lab work if needed at that time.  New labs today since you had most of what I would check earlier this morning.  Take care!    Signed,   Reyes Pines, MD [facility], [facility] 05/15/24 4:15 PM

## 2024-05-15 NOTE — Patient Instructions (Addendum)
 Thank you for coming in today. No change in medications at this time.  Continue plan as discussed earlier today for your preop visit regarding the aspirin  and other meds prior to surgery.  Will be thinking about you on Friday.  Follow-up me in 3 months and we can catch up on meds further at that time and check updated labs for vitamin D , other lab work if needed at that time.  New labs today since you had most of what I would check earlier this morning.  Take care!

## 2024-05-16 ENCOUNTER — Encounter (HOSPITAL_COMMUNITY): Payer: Self-pay | Admitting: Surgery

## 2024-05-16 MED ORDER — EPINEPHRINE HCL 5 MG/250ML IV SOLN IN NS
0.0000 ug/min | INTRAVENOUS | Status: DC
Start: 2024-05-17 — End: 2024-05-18
  Filled 2024-05-16: qty 250

## 2024-05-16 MED ORDER — INSULIN REGULAR(HUMAN) IN NACL 100-0.9 UT/100ML-% IV SOLN
INTRAVENOUS | Status: AC
  Administered 2024-05-17: .9 [IU]/h via INTRAVENOUS
  Filled 2024-05-16: qty 100

## 2024-05-16 MED ORDER — HEPARIN 30,000 UNITS/1000 ML (OHS) CELLSAVER SOLUTION
Status: DC
  Filled 2024-05-16: qty 1000

## 2024-05-16 MED ORDER — PLASMA-LYTE A IV SOLN
INTRAVENOUS | Status: DC
  Filled 2024-05-16: qty 2.5

## 2024-05-16 MED ORDER — MILRINONE LACTATE IN DEXTROSE 20-5 MG/100ML-% IV SOLN
0.3000 ug/kg/min | INTRAVENOUS | Status: DC
  Filled 2024-05-16: qty 100

## 2024-05-16 MED ORDER — DEXMEDETOMIDINE HCL IN NACL 400 MCG/100ML IV SOLN
0.1000 ug/kg/h | INTRAVENOUS | Status: AC
  Administered 2024-05-17: .4 ug/kg/h via INTRAVENOUS
  Filled 2024-05-16: qty 100

## 2024-05-16 MED ORDER — TRANEXAMIC ACID (OHS) PUMP PRIME SOLUTION
2.0000 mg/kg | INTRAVENOUS | Status: DC
  Filled 2024-05-16: qty 1.73

## 2024-05-16 MED ORDER — NOREPINEPHRINE 4 MG/250ML-% IV SOLN
0.0000 ug/min | INTRAVENOUS | Status: DC
Start: 2024-05-17 — End: 2024-05-18
  Filled 2024-05-16: qty 250

## 2024-05-16 MED ORDER — PHENYLEPHRINE HCL-NACL 20-0.9 MG/250ML-% IV SOLN
30.0000 ug/min | INTRAVENOUS | Status: AC
Start: 2024-05-17 — End: 2024-05-18
  Administered 2024-05-17: 30 ug/min via INTRAVENOUS
  Filled 2024-05-16: qty 250

## 2024-05-16 MED ORDER — VANCOMYCIN HCL 1.5 G IV SOLR
1500.0000 mg | INTRAVENOUS | Status: AC
  Administered 2024-05-17: 1500 mg via INTRAVENOUS
  Filled 2024-05-16: qty 30

## 2024-05-16 MED ORDER — MAGNESIUM SULFATE 50 % IJ SOLN
40.0000 meq | INTRAMUSCULAR | Status: DC
  Filled 2024-05-16: qty 9.85

## 2024-05-16 MED ORDER — NITROGLYCERIN IN D5W 200-5 MCG/ML-% IV SOLN
2.0000 ug/min | INTRAVENOUS | Status: DC
Start: 2024-05-17 — End: 2024-05-18
  Filled 2024-05-16: qty 250

## 2024-05-16 MED ORDER — CEFAZOLIN SODIUM-DEXTROSE 2-4 GM/100ML-% IV SOLN
2.0000 g | INTRAVENOUS | Status: AC
  Administered 2024-05-17: 2 g via INTRAVENOUS
  Filled 2024-05-16: qty 100

## 2024-05-16 MED ORDER — TRANEXAMIC ACID (OHS) BOLUS VIA INFUSION
15.0000 mg/kg | INTRAVENOUS | Status: AC
  Administered 2024-05-17: 1294.5 mg via INTRAVENOUS
  Filled 2024-05-16: qty 1295

## 2024-05-16 MED ORDER — POTASSIUM CHLORIDE 2 MEQ/ML IV SOLN
80.0000 meq | INTRAVENOUS | Status: DC
  Filled 2024-05-16: qty 40

## 2024-05-16 MED ORDER — TRANEXAMIC ACID 1000 MG/10ML IV SOLN
1.5000 mg/kg/h | INTRAVENOUS | Status: AC
  Administered 2024-05-17: 1.5 mg/kg/h via INTRAVENOUS
  Filled 2024-05-16: qty 25

## 2024-05-16 NOTE — H&P (Signed)
 [address], [place] [address]             [phone]     PCP is Levora Reyes SAUNDERS, MD Referring Provider is Verlin Lonni BIRCH*       Chief Complaint  Patient presents with   Coronary Artery Disease            HPI:   The patient is a 73 year old woman with a history of prediabetes, hypertension, hyperlipidemia, hypothyroidism, prior smoking, and arthritis for which she takes oxycodone  and is followed in a pain clinic who presented with intermittent chest discomfort.  A coronary CTA in February 2025 showed a calcium  score of 3084 which was 99th percentile.  CT FFR analysis did not show any significant focal stenosis although there was a gradual decrease in the FFR across the left circumflex system as the vessel became smaller in caliber.  An echocardiogram in March 2025 showed normal left ventricular ejection fraction.  She continued to have episodes of right jaw and neck pain radiating down into her chest that were nitrate responsive.  A PET stress scan was done on 03/27/2024 evidence of ischemia.  She continued to have episodes of jaw and neck discomfort radiating into her chest as well as some pain down her right arm and underwent cardiac catheterization on 04/03/2024 showing severe three-vessel disease.  She had PCI with DES to the left circumflex that was complicated by dissection requiring stenting into the first marginal branch.  She also had significant disease in the LAD and RCA systems and was discharged on aspirin  and Plavix  with plans for a surgical evaluation after she was discussed at the multidisciplinary heart team meeting on 04/05/2024.   She reports continued episodes of right jaw and neck pain radiating into her chest and has taken several nitroglycerin  per day.  This usually resolves quickly after single nitroglycerin .  These episodes have occurred with emotional stress as well as exertion.  She has had some exertional shortness of breath.  She  denies lower extremity edema and orthopnea.   She lives alone with her cats and has an elderly cat that she is concerned about being at the end of his life and that is causing her a lot of stress.       Past Medical History:  Diagnosis Date   Arthritis     Benign cyst of right kidney 2014    2.7 cm right kidney cyst seen on imaging 2014 but was c/o right flank pain with new persistent proteinuria (fortunately hematuria had resolved) so repeat US  11/2016 showed right kidney still with 2.6 cm simple cyst and otherwise nml.   Bipolar 1 disorder (HCC)     Cancer (HCC)      Phreesia 06/15/2020   Chronic kidney disease     Depression     Depression      [employer]reesia 06/15/2020   GERD (gastroesophageal reflux disease)     Heart murmur     Hepatitis C      resolved completely after treatment in 2014, genotype 1b, followed at [facility]   Hyperlipidemia     Hypertension     Hypothyroidism     Jaundice 11/01/2012   Pill dysphagia 10/20/2021   Pruritic disorder 02/13/2013   Sleep apnea      Was diagnosed approximately 20 years ago, but does not wear CPAP patient stated I gave it back.SABRASABRAI couldn't wear that thing it was horrible   Spinal stenosis  Thyroid  disease      Phreesia 06/15/2020   Unintentional weight loss of more than 10 pounds 07/12/2018               Past Surgical History:  Procedure Laterality Date   ABDOMINAL HYSTERECTOMY       COLONOSCOPY W/ POLYPECTOMY       CORONARY STENT INTERVENTION N/A 04/03/2024    Procedure: CORONARY STENT INTERVENTION;  Surgeon: Ladona Heinz, MD;  Location: [facility];  Service: Cardiovascular;  Laterality: N/A;   JOINT REPLACEMENT Bilateral      knee   KNEE ARTHROSCOPY Bilateral     LEFT HEART CATH AND CORONARY ANGIOGRAPHY N/A 04/03/2024    Procedure: LEFT HEART CATH AND CORONARY ANGIOGRAPHY;  Surgeon: Ladona Heinz, MD;  Location: [facility];  Service: Cardiovascular;  Laterality: N/A;   PARTIAL HYSTERECTOMY       THYROID  LOBECTOMY  Left 06/16/2016   THYROID  LOBECTOMY Left 06/16/2016    Procedure: LEFT THYROID  LOBECTOMY;  Surgeon: Camellia Blush, MD;  Location: [facility] OR;  Service: General;  Laterality: Left;   TOTAL HIP ARTHROPLASTY Left 08/03/2021    Procedure: TOTAL HIP ARTHROPLASTY ANTERIOR APPROACH;  Surgeon: Ernie Cough, MD;  Location: [facility] ORS;  Service: Orthopedics;  Laterality: Left;   TOTAL HIP ARTHROPLASTY Right 07/05/2022    Procedure: TOTAL HIP ARTHROPLASTY ANTERIOR APPROACH;  Surgeon: Ernie Cough, MD;  Location: [facility] OR[facility];  Service: Orthopedics;  Laterality: Right;               Family History  Problem Relation Age of Onset   Diabetes Mother     Heart disease Mother     Hyperlipidemia Mother     Hypertension Mother     Stroke Mother     Hypertension Father     Parkinson's disease Father     Heart disease Father     Hyperlipidemia Father     Hemachromatosis Daughter     Thyroid  disease Neg Hx     Colon cancer Neg Hx     Esophageal cancer Neg Hx     Inflammatory bowel disease Neg Hx     Liver disease Neg Hx     Pancreatic cancer Neg Hx     Rectal cancer Neg Hx     Stomach cancer Neg Hx     Sleep apnea Neg Hx            Social History Social History  Social History         Tobacco Use   Smoking status: Former      Current packs/day: 0.00      Average packs/day: 1 pack/day for 15.0 years (15.0 ttl pk-yrs)      Types: Cigarettes      Start date: 06/29/1964      Quit date: 06/30/1979      Years since quitting: 44.8   Smokeless tobacco: Never  Vaping Use   Vaping status: Never Used  Substance Use Topics   Alcohol use: Not Currently      Alcohol/week: 0.0 - 1.0 standard drinks of alcohol   Drug use: Never              Current Outpatient Medications  Medication Sig Dispense Refill   acetaminophen  (TYLENOL ) 500 MG tablet Take 2 tablets (1,000 mg total) by mouth every 6 (six) hours. (Patient taking differently: Take 1,000 mg by mouth every 6 (six) hours as needed for moderate pain (pain  score 4-6).) 30 tablet 0  ALPRAZolam  (XANAX ) 1 MG tablet TAKE 1/2 TO 1 TABLET BY MOUTH twice daily AS NEEDED FOR ANXIETY 90 tablet 1   amLODipine  (NORVASC ) 10 MG tablet Take 1 tablet (10 mg total) by mouth daily. 90 tablet 1   aspirin  EC 81 MG tablet Take 1 tablet (81 mg total) by mouth daily. Swallow whole. 30 tablet 12   atenolol  (TENORMIN ) 25 MG tablet Take 25 mg by mouth daily.       atorvastatin  (LIPITOR) 80 MG tablet Take 1 tablet (80 mg total) by mouth daily. 90 tablet 3   clopidogrel  (PLAVIX ) 75 MG tablet Take 1 tablet (75 mg total) by mouth daily with breakfast. 90 tablet 3   docusate sodium  (COLACE) 100 MG capsule Take 1 capsule (100 mg total) by mouth 2 (two) times daily. (Patient taking differently: Take 100 mg by mouth daily as needed for moderate constipation.) 10 capsule 0   ezetimibe  (ZETIA ) 10 MG tablet Take 1 tablet (10 mg total) by mouth daily. 90 tablet 3   hydrOXYzine  (ATARAX ) 25 MG tablet TAKE ONE TABLET BY MOUTH every EIGHT hours AS NEEDED 60 tablet 5   isosorbide  mononitrate (IMDUR ) 30 MG 24 hr tablet Take 1 tablet (30 mg total) by mouth daily. 90 tablet 3   lamoTRIgine  (LAMICTAL ) 100 MG tablet TAKE TWO TABLETS BY MOUTH EVERY MORNING and TAKE ONE TABLET BY MOUTH EVERY EVENING 270 tablet 1   levothyroxine  (SYNTHROID ) 100 MCG tablet TAKE 1 TABLET BY MOUTH DAILY BEFORE BREAKFAST 90 tablet 1   losartan  (COZAAR ) 50 MG tablet TAKE 1 TABLET BY MOUTH DAILY 90 tablet 1   nitroGLYCERIN  (NITROSTAT ) 0.4 MG SL tablet Place 0.4 mg under the tongue every 5 (five) minutes as needed for chest pain.       oxyCODONE -acetaminophen  (PERCOCET) 10-325 MG tablet Take 1 tablet by mouth every 4 (four) hours as needed for pain.       pantoprazole  (PROTONIX ) 40 MG tablet Take 1 tablet (40 mg total) by mouth daily at 6 (six) AM. 90 tablet 0   polyethylene glycol (MIRALAX  / GLYCOLAX ) 17 g packet Take 17 g by mouth daily as needed for mild constipation. 14 each 0   QUEtiapine  (SEROQUEL ) 50 MG tablet Take  1 tablet (50 mg total) by mouth daily. 90 tablet 1   traZODone  (DESYREL ) 150 MG tablet TAKE TWO TABLETS BY MOUTH everyday AT bedtime 180 tablet 1   Vitamin D , Ergocalciferol , (DRISDOL ) 1.25 MG (50000 UNIT) CAPS capsule Take 1 capsule (50,000 Units total) by mouth every 7 (seven) days. Sunday 12 capsule 3               Current Facility-Administered Medications  Medication Dose Route Frequency Provider Last Rate Last Admin   cyanocobalamin  ((VITAMIN B-12)) injection 1,000 mcg  1,000 mcg Intramuscular Q30 days Loreli Elyn SAILOR, MD   1,000 mcg at 07/12/18 1652        Allergies  No Known Allergies     Review of Systems  Constitutional:  Positive for activity change, appetite change and fatigue.  HENT: Negative.    Eyes: Negative.   Respiratory:  Positive for shortness of breath.   Cardiovascular:  Positive for chest pain. Negative for leg swelling.  Gastrointestinal: Negative.   Endocrine: Negative.   Genitourinary: Negative.   Musculoskeletal:  Positive for arthralgias.  Skin: Negative.   Allergic/Immunologic: Negative.   Neurological:  Negative for dizziness and syncope.  Hematological: Negative.   Psychiatric/Behavioral:         Anxiety and  depression, stress related to her elderly cat     BP 132/67   Pulse 82   Resp 20   Ht 5' 4.5 (1.638 m)   Wt 187 lb (84.8 kg)   SpO2 95% Comment: RA  BMI 31.60 kg/m  Physical Exam Constitutional:      Appearance: Normal appearance.  HENT:     Head: Normocephalic and atraumatic.  Eyes:     Extraocular Movements: Extraocular movements intact.     Conjunctiva/sclera: Conjunctivae normal.     Pupils: Pupils are equal, round, and reactive to light.  Neck:     Vascular: No carotid bruit.  Cardiovascular:     Rate and Rhythm: Normal rate and regular rhythm.     Pulses: Normal pulses.     Heart sounds: Normal heart sounds. No murmur heard. Pulmonary:     Effort: Pulmonary effort is normal.     Breath sounds: Normal breath sounds.   Abdominal:     General: Bowel sounds are normal. There is no distension.     Tenderness: There is no abdominal tenderness.  Musculoskeletal:        General: No swelling.  Skin:    General: Skin is warm and dry.  Neurological:     General: No focal deficit present.     Mental Status: She is alert and oriented to person, place, and time.         Diagnostic Tests:   Physicians   Panel Physicians Referring Physician Case Authorizing Physician  Ladona Heinz, MD (Primary)        Procedures   LEFT HEART CATH AND CORONARY ANGIOGRAPHY  CORONARY STENT INTERVENTION    Conclusion   Cardiac Catheterization 04/03/24: Hemodynamic data: LVEDP 35 mm Hg, Markedly elevated, no pressure gradient across the aortic valve.   Angiographic data: LM: Large-caliber vessel, mildly calcified. LAD: It is a moderate large caliber vessel, proximal LAD has diffuse 30 to 40% calcific stenosis followed by a very focal 95% calcific stenosis at the origin of D2 and is tortuous at this segment.  Distal-apical LAD has a 70% focal stenosis. LCx: Codominant with RCA.  Moderately calcified.  There is a very focal 95% calcific stenosis in the midsegment just proximal to the origin of a very large OM1.  OM1 has a ostial 30 to 40% calcific stenosis. RCA: Codominant with CX, continues as a moderate-sized PL branch with very small PDA, has a focal 90% stenosis at origin of RV branch in the midsegment.   Intervention data: Successful but difficult procedure due to calcification and distal dissection.  PTCA and stenting of the mid CX and OM1 with 2 overlapping 3.5 x 12 in the CX and 3.0 x 12 mm Synergy XD DES overlapping into the OM1, stenosis reduced from 95% to 0% with TIMI-3 to TIMI-3 flow.      Impression and recommendations: Patient has severely complex three-vessel coronary disease and probably would be better suited for CABG.  I attempted to pass the guidewire into the LAD after the CX PCI, and I was unable to pass  a 2.5 mm balloon to predilate the lesion.  There was also spasm mid segment Cx and also a small type a dissection was evident postprocedure in the LAD.  I suspect if she needs PCI to the LAD, it is can be a complex long-term intervention with atherectomy.  RCA lesion is very focal.   I have recommended that we observe her overnight for any potential post PCI complication, discharge in the  morning, elective staged intervention to RCA and will take a relook at LAD at that time and consider atherectomy followed by stenting of a focal high-grade mid LAD stenosis avoiding stenting the long proximal segment.   Recommendations   Antiplatelet/Anticoag Recommend uninterrupted dual antiplatelet therapy with Aspirin  81mg  daily and Clopidogrel  75mg  daily for a minimum of 6 months (stable ischemic heart disease-Class I recommendation).    Procedural Details   Technical Details Procedure performed: Left heart catheterization including LV hemodynamics, selective right left coronary arteriogram, PTCA and stenting of the mid circumflex and OM1 branch of circumflex coronary artery with 2 overlapping DES.  Indication: Patient with hypertension, hypercholesterolemia, prior history of tobacco use disorder, markedly elevated coronary calcium  score and presenting with anginal symptoms in spite of 2 antianginal medications, in view of lifestyle limiting claudication, he had undergone outpatient stress test which suggested triple-vessel coronary disease, recommended cardiac catheterization.  Technique: Under sterile precautions using 6 French right radial arterial access, a 5 [place] Tiger 4.0 diagnostic cath was utilized to perform selective left and right coronary arteriogram and LV hemodynamics.  Using heparin  for anticoagulation, ACT was kept ACT >250 seconds, I utilized 6 [place] XB LAD 3.5 guide catheter to engage the left main coronary artery.  I then utilized run-through guidewire and crossed the circumflex coronary  artery into OM1.  The lesion was moderate to heavily calcified, I utilized one-to-one balloon ratio to perform low-pressure inflation to see if the balloon would adequately expand at low inflation pressure, 3.5 x 15 mm Emerge balloon was utilized and 4 atmospheric pressure inflation was performed revealing complete expansion of the balloon hence I proceeded with implantation of a 3.5 x 12 mm Synergy XD DES, there was distal dissection with dye staining noted hence I covered the dissection as it was antegrade I deployed the stent at 11 atmospheric pressure for 45 seconds and angiography was repeated.  I felt comfortable that distal edge dissection has been covered.  I then directed the attention towards the mid LAD stenosis.  Run-through guidewire was again utilized to cross into the LAD.   In attempting to predilate the lesion with a 2.5 mm balloon, I soon realized that the LAD stenosis is heavily calcified and I was unable to cross the balloon, at this point I realized there was also dissection that was extending into the OM1 lesion and the lesion appeared much more unstable.  I withdrew the wire from the LAD and advanced back into the circumflex coronary artery into the OM1 and placed a 3.0 x 12 mm Synergy XD DES overlapping the previously placed stent distally and deployed the stent at 13 atmospheric pressure and use the same stent balloon to perform postdilatation at 16 atmospheric pressure for 30 seconds.  Complete sealing of the dissection was evident with TIMI-3 flow in the circumflex and OM1.  There was mild dye staining into the proximal LAD when I was trying to withdraw the guidewire back from the LAD insertion, there was suctioning effect of the guide into the LAD leading to a antegrade dissection but with TIMI-3 flow.  With the complexity of her disease, I felt that she should be observed overnight for any potential complications.  She will be discharged home in the morning.  210 mL contrast  utilized.   Sedation:   Conscious sedation was administered under my direct supervision. IV Versed  4 mg plus fentanyl   150 mcg were utilized. Continuous ECG, pulse oximetry and blood pressure was monitored throughout the entire procedure. Conscious  sedation was for a total of 73 minutes. I was present during the entire time of patient observation.     Estimated blood loss <50 mL.   During this procedure medications were administered to achieve and maintain moderate conscious sedation while the patient's heart rate, blood pressure, and oxygen saturation were continuously monitored and I was present face-to-face 100% of this time. Logan Boothe Rad Tech and Como Years RN are independent, trained observers who assisted in the monitoring of the patient's level of consciousness.    Medications (Filter: Administrations occurring from [phone] to 1122 on 04/03/24) fentaNYL  (SUBLIMAZE ) injection (mcg)  Total dose: 150 mcg Date/Time Rate/Dose/Volume Action    04/03/24 0943 50 mcg Given    Canceled Entry    1010 25 mcg Given    1024 25 mcg Given    1042 50 mcg Given    Heparin  (Porcine) in NaCl 1000-0.9 UT/500ML-% SOLN (mL)  Total volume: 1,500 mL Date/Time Rate/Dose/Volume Action    04/03/24 0948 500 mL Given    0948 500 mL Given    1033 500 mL Given    midazolam  (VERSED ) injection (mg)  Total dose: 4 mg Date/Time Rate/Dose/Volume Action    04/03/24 0952 2 mg Given    Canceled Entry    1010 1 mg Given    1024 1 mg Given    lidocaine  (PF) (XYLOCAINE ) 1 % injection (mL)  Total volume: 3 mL Date/Time Rate/Dose/Volume Action    04/03/24 1005 3 mL Given    Radial Cocktail/Verapamil  only (mL)  Total volume: 10 mL Date/Time Rate/Dose/Volume Action    04/03/24 1006 10 mL Given    heparin  sodium (porcine) injection (Units)  Total dose: 15,000 Units Date/Time Rate/Dose/Volume Action    04/03/24 1009 5,000 Units Given    1016 5,000 Units Given    1028 3,000 Units Given    1043 2,000 Units  Given    clopidogrel  (PLAVIX ) tablet (mg)  Total dose: 600 mg Date/Time Rate/Dose/Volume Action    04/03/24 1037 600 mg Given    hydrALAZINE  (APRESOLINE ) injection (mg)  Total dose: 10 mg Date/Time Rate/Dose/Volume Action    04/03/24 1104 10 mg Given    iohexol  (OMNIPAQUE ) 350 MG/ML injection (mL)  Total volume: 210 mL Date/Time Rate/Dose/Volume Action    04/03/24 1112 210 mL Given      Sedation Time   Sedation Time Physician-1: 1 hour 17 minutes 3 seconds Contrast        Administrations occurring from 0936 to 1122 on 04/03/24:  Medication Name Total Dose  iohexol  (OMNIPAQUE ) 350 MG/ML injection 210 mL    Radiation/Fluoro   Fluoro time: 15.2 (min) DAP: 21.5 (mGycm2) Cumulative Air Kerma: 783 (mGy) Complications   Complications documented before study signed (04/03/2024  5:03 PM)    No complications were associated with this study.  Documented by Years, Leonor BRAVO, RN - 04/03/2024 11:11 AM      Coronary Findings   Diagnostic Dominance: Co-dominant Left Anterior Descending  Prox LAD to Mid LAD lesion is 40% stenosed.  Mid LAD lesion is 95% stenosed.  Dist LAD lesion is 70% stenosed.    Left Circumflex  Prox Cx to Mid Cx lesion is 95% stenosed. The lesion is type C. The lesion is moderately calcified.    First Obtuse Marginal Branch  1st Mrg lesion is 40% stenosed.    Right Coronary Artery  Prox RCA lesion is 90% stenosed.    Intervention    Prox Cx to Mid Cx lesion  Angioplasty  Lesion length: 8 mm. CATH VISTA GUIDE 6FR XBLD 3.5 guide catheter was inserted. WIRE RUNTHROUGH .[id number] guidewire used to cross lesion. Balloon angioplasty was performed using a BALLOON EMERGE MR 3.5X15. Maximum pressure: 4 atm. Inflation time: 30 sec.  Stent  A drug-eluting stent was successfully placed using a STENT SYNERGY XD 3.50X12. Maximum pressure: 13 atm. Inflation time: 45 sec. Stent strut is well apposed.  Post-Intervention Lesion Assessment  The intervention was  successful. Pre-interventional TIMI flow is 3. Post-intervention TIMI flow is 3. No complications occurred at this lesion.  There is a 0% residual stenosis post intervention.    1st Mrg lesion  Stent  A drug-eluting stent was successfully placed using a STENT SYNERGY XD 3.0X12. Maximum pressure: 11 atm. Inflation time: 30 sec. Stent strut is well apposed. Stent overlaps previously placed stent. Post-stent angioplasty was performed. Stent balloon used to post dill overlap at 16 atm for 30 S  Post-Intervention Lesion Assessment  The intervention was successful. Pre-interventional TIMI flow is 3. Post-intervention TIMI flow is 3. At this lesion, a dissection occurred. As I pulled the LAD guide wire, the guide catheter was sucked into the LAD and there was Prox LAD type A dissection without flow compromise and hence left alone due to complexity of the LAD stenosis.  There is a 0% residual stenosis post intervention.      Left Heart   Left Ventricle LV end diastolic pressure is severely elevated.    Coronary Diagrams   Diagnostic Dominance: Co-dominant  Intervention     Implants    Permanent Stent   Stent Synergy Xd 3.50x12 - [id number] - Implanted  Inventory item: CLEDA OSWALD STAPLER 6.49K87 Model/Cat number: [id number]  Manufacturer: [employer] Lot number: [id number]  Device identifier: [id number] Device identifier type: GS1  Area Of Implantation: Mid CFX      GUDID Information           Request status Successful      Brand name: LORRIN STAPLER Version/Model: [id number]  Company name: [employer] MRI safety info as of 04/03/24: MR Conditional  Contains dry or latex rubber: No      GMDN P.T. name: Drug-eluting coronary artery stent, non-bioabsorbable-polymer-coated      As of 04/03/2024   Status: Implanted      Stent Synergy Xd 3.0x12 - [id number] - Implanted  Inventory item: CLEDA OSWALD STAPLER 6.9K87 Model/Cat number: [id number]   Manufacturer: [employer] Lot number: [id number]  Device identifier: [id number] Device identifier type: GS1  Area Of Implantation: Mid CFX      GUDID Information           Request status Successful      Brand name: LORRIN STAPLER Version/Model: [id number]  Company name: [employer] MRI safety info as of 04/03/24: MR Conditional  Contains dry or latex rubber: No      GMDN P.T. name: Drug-eluting coronary artery stent, non-bioabsorbable-polymer-coated      As of 04/03/2024   Status: Implanted        Syngo Images    Show images for CARDIAC CATHETERIZATION Images on Long Term Storage    Show images for Jaia, Alonge to Procedure Log   Procedure Log    Hemo Data   Flowsheet Row Most Recent Value  AO Systolic Pressure 148 mmHg  AO Diastolic Pressure 75 mmHg  AO Mean 108 mmHg  LV Systolic Pressure 173 mmHg  LV Diastolic Pressure -4 mmHg  LV EDP 35 mmHg  Arterial Occlusion  Pressure Extended Systolic Pressure 165 mmHg  Arterial Occlusion Pressure Extended Diastolic Pressure 70 mmHg  Arterial Occlusion Pressure Extended Mean Pressure 109 mmHg  Left Ventricular Apex Extended Systolic Pressure 169 mmHg  LVp Diastolic Pressure 15 mmHg  Left Ventricular Apex Extended EDP Pressure 31 mmHg      Impression:   This 73 year old woman has severe three-vessel coronary disease with recurrent anginal symptoms related to exertional or emotional stress.  She had successful PCI of the left circumflex but still has high-grade stenosis in the LAD and RCA.  I agree that coronary artery bypass graft surgery is the best treatment for resolution of her symptoms and to prevent myocardial loss.  She is currently on aspirin  and Plavix  which she will need to continue for at least 6 months.  I discussed the case with Dr. Ladona and he will coordinate Lovenox  bridging while she stops Plavix  5 days preoperatively at home. I discussed the operative procedure with the patient  including alternatives, benefits and risks; including but not limited to bleeding, blood transfusion, infection, stroke, myocardial infarction, graft failure, heart block requiring a permanent pacemaker, organ dysfunction, and death.  [name] understands and agrees to proceed.     Plan:   Coronary artery bypass graft surgery.  Dorise MARLA Fellers, MD [facility] [phone]

## 2024-05-17 ENCOUNTER — Inpatient Hospital Stay (HOSPITAL_COMMUNITY)
Admission: RE | Disposition: A | Discharge status: Skilled Nursing Facility | Payer: Self-pay | Source: Home / Self Care | Attending: Surgery

## 2024-05-17 ENCOUNTER — Other Ambulatory Visit: Payer: Self-pay

## 2024-05-17 ENCOUNTER — Encounter (HOSPITAL_COMMUNITY): Payer: Self-pay | Admitting: Surgery

## 2024-05-17 ENCOUNTER — Inpatient Hospital Stay (HOSPITAL_COMMUNITY): Payer: Self-pay | Admitting: Certified Registered Nurse Anesthetist

## 2024-05-17 ENCOUNTER — Inpatient Hospital Stay (HOSPITAL_COMMUNITY)

## 2024-05-17 ENCOUNTER — Inpatient Hospital Stay (HOSPITAL_COMMUNITY)
Admission: RE | Admit: 2024-05-17 | Discharge: 2024-05-25 | DRG: 236 | Disposition: A | Discharge status: Skilled Nursing Facility | Attending: Surgery | Admitting: Surgery

## 2024-05-17 DIAGNOSIS — S92353A Displaced fracture of fifth metatarsal bone, unspecified foot, initial encounter for closed fracture: Secondary | ICD-10-CM | POA: Diagnosis not present

## 2024-05-17 DIAGNOSIS — Z87891 Personal history of nicotine dependence: Secondary | ICD-10-CM

## 2024-05-17 DIAGNOSIS — Z7902 Long term (current) use of antithrombotics/antiplatelets: Secondary | ICD-10-CM

## 2024-05-17 DIAGNOSIS — I440 Atrioventricular block, first degree: Secondary | ICD-10-CM | POA: Diagnosis not present

## 2024-05-17 DIAGNOSIS — R195 Other fecal abnormalities: Secondary | ICD-10-CM | POA: Diagnosis not present

## 2024-05-17 DIAGNOSIS — I10 Essential (primary) hypertension: Secondary | ICD-10-CM | POA: Diagnosis not present

## 2024-05-17 DIAGNOSIS — J9811 Atelectasis: Secondary | ICD-10-CM | POA: Diagnosis not present

## 2024-05-17 DIAGNOSIS — I25119 Atherosclerotic heart disease of native coronary artery with unspecified angina pectoris: Secondary | ICD-10-CM

## 2024-05-17 DIAGNOSIS — Z85850 Personal history of malignant neoplasm of thyroid: Secondary | ICD-10-CM | POA: Diagnosis not present

## 2024-05-17 DIAGNOSIS — Z8249 Family history of ischemic heart disease and other diseases of the circulatory system: Secondary | ICD-10-CM

## 2024-05-17 DIAGNOSIS — R931 Abnormal findings on diagnostic imaging of heart and coronary circulation: Secondary | ICD-10-CM | POA: Diagnosis not present

## 2024-05-17 DIAGNOSIS — Z820 Family history of epilepsy and other diseases of the nervous system: Secondary | ICD-10-CM

## 2024-05-17 DIAGNOSIS — I499 Cardiac arrhythmia, unspecified: Secondary | ICD-10-CM | POA: Diagnosis not present

## 2024-05-17 DIAGNOSIS — Z9889 Other specified postprocedural states: Secondary | ICD-10-CM | POA: Diagnosis not present

## 2024-05-17 DIAGNOSIS — R000 Tachycardia, unspecified: Secondary | ICD-10-CM | POA: Diagnosis not present

## 2024-05-17 DIAGNOSIS — Z7982 Long term (current) use of aspirin: Secondary | ICD-10-CM

## 2024-05-17 DIAGNOSIS — Z951 Presence of aortocoronary bypass graft: Principal | ICD-10-CM

## 2024-05-17 DIAGNOSIS — I088 Other rheumatic multiple valve diseases: Secondary | ICD-10-CM | POA: Diagnosis not present

## 2024-05-17 DIAGNOSIS — Z79899 Other long term (current) drug therapy: Secondary | ICD-10-CM

## 2024-05-17 DIAGNOSIS — Z833 Family history of diabetes mellitus: Secondary | ICD-10-CM | POA: Diagnosis not present

## 2024-05-17 DIAGNOSIS — F411 Generalized anxiety disorder: Secondary | ICD-10-CM

## 2024-05-17 DIAGNOSIS — I2510 Atherosclerotic heart disease of native coronary artery without angina pectoris: Principal | ICD-10-CM | POA: Diagnosis present

## 2024-05-17 DIAGNOSIS — Z955 Presence of coronary angioplasty implant and graft: Secondary | ICD-10-CM | POA: Diagnosis not present

## 2024-05-17 DIAGNOSIS — C73 Malignant neoplasm of thyroid gland: Secondary | ICD-10-CM | POA: Diagnosis not present

## 2024-05-17 DIAGNOSIS — Z79890 Hormone replacement therapy (postmenopausal): Secondary | ICD-10-CM | POA: Diagnosis not present

## 2024-05-17 DIAGNOSIS — F319 Bipolar disorder, unspecified: Secondary | ICD-10-CM | POA: Diagnosis not present

## 2024-05-17 DIAGNOSIS — R41841 Cognitive communication deficit: Secondary | ICD-10-CM | POA: Diagnosis not present

## 2024-05-17 DIAGNOSIS — Z823 Family history of stroke: Secondary | ICD-10-CM

## 2024-05-17 DIAGNOSIS — E039 Hypothyroidism, unspecified: Secondary | ICD-10-CM | POA: Diagnosis present

## 2024-05-17 DIAGNOSIS — R7989 Other specified abnormal findings of blood chemistry: Secondary | ICD-10-CM | POA: Diagnosis not present

## 2024-05-17 DIAGNOSIS — Z79891 Long term (current) use of opiate analgesic: Secondary | ICD-10-CM

## 2024-05-17 DIAGNOSIS — Z01818 Encounter for other preprocedural examination: Secondary | ICD-10-CM | POA: Diagnosis not present

## 2024-05-17 DIAGNOSIS — R7303 Prediabetes: Secondary | ICD-10-CM | POA: Diagnosis present

## 2024-05-17 DIAGNOSIS — G8929 Other chronic pain: Secondary | ICD-10-CM | POA: Diagnosis present

## 2024-05-17 DIAGNOSIS — M6281 Muscle weakness (generalized): Secondary | ICD-10-CM | POA: Diagnosis not present

## 2024-05-17 DIAGNOSIS — R140 Abdominal distension (gaseous): Secondary | ICD-10-CM | POA: Diagnosis not present

## 2024-05-17 DIAGNOSIS — Z452 Encounter for adjustment and management of vascular access device: Secondary | ICD-10-CM | POA: Diagnosis not present

## 2024-05-17 DIAGNOSIS — M5416 Radiculopathy, lumbar region: Secondary | ICD-10-CM | POA: Diagnosis not present

## 2024-05-17 DIAGNOSIS — Z96643 Presence of artificial hip joint, bilateral: Secondary | ICD-10-CM | POA: Diagnosis present

## 2024-05-17 DIAGNOSIS — I5189 Other ill-defined heart diseases: Secondary | ICD-10-CM | POA: Diagnosis not present

## 2024-05-17 DIAGNOSIS — Z90710 Acquired absence of both cervix and uterus: Secondary | ICD-10-CM | POA: Diagnosis not present

## 2024-05-17 DIAGNOSIS — G4730 Sleep apnea, unspecified: Secondary | ICD-10-CM | POA: Diagnosis present

## 2024-05-17 DIAGNOSIS — Z8619 Personal history of other infectious and parasitic diseases: Secondary | ICD-10-CM

## 2024-05-17 DIAGNOSIS — N898 Other specified noninflammatory disorders of vagina: Secondary | ICD-10-CM | POA: Diagnosis not present

## 2024-05-17 DIAGNOSIS — Z9861 Coronary angioplasty status: Secondary | ICD-10-CM | POA: Diagnosis not present

## 2024-05-17 DIAGNOSIS — J90 Pleural effusion, not elsewhere classified: Secondary | ICD-10-CM | POA: Diagnosis not present

## 2024-05-17 DIAGNOSIS — R918 Other nonspecific abnormal finding of lung field: Secondary | ICD-10-CM | POA: Diagnosis not present

## 2024-05-17 DIAGNOSIS — R197 Diarrhea, unspecified: Secondary | ICD-10-CM | POA: Diagnosis not present

## 2024-05-17 DIAGNOSIS — R262 Difficulty in walking, not elsewhere classified: Secondary | ICD-10-CM | POA: Diagnosis not present

## 2024-05-17 DIAGNOSIS — K760 Fatty (change of) liver, not elsewhere classified: Secondary | ICD-10-CM | POA: Diagnosis not present

## 2024-05-17 DIAGNOSIS — Z48812 Encounter for surgical aftercare following surgery on the circulatory system: Secondary | ICD-10-CM | POA: Diagnosis not present

## 2024-05-17 DIAGNOSIS — M51369 Other intervertebral disc degeneration, lumbar region without mention of lumbar back pain or lower extremity pain: Secondary | ICD-10-CM | POA: Diagnosis not present

## 2024-05-17 DIAGNOSIS — I25118 Atherosclerotic heart disease of native coronary artery with other forms of angina pectoris: Secondary | ICD-10-CM | POA: Diagnosis not present

## 2024-05-17 DIAGNOSIS — I480 Paroxysmal atrial fibrillation: Secondary | ICD-10-CM | POA: Diagnosis not present

## 2024-05-17 DIAGNOSIS — G4089 Other seizures: Secondary | ICD-10-CM | POA: Diagnosis not present

## 2024-05-17 DIAGNOSIS — R251 Tremor, unspecified: Secondary | ICD-10-CM | POA: Diagnosis not present

## 2024-05-17 DIAGNOSIS — Z4682 Encounter for fitting and adjustment of non-vascular catheter: Secondary | ICD-10-CM | POA: Diagnosis not present

## 2024-05-17 DIAGNOSIS — E785 Hyperlipidemia, unspecified: Secondary | ICD-10-CM | POA: Diagnosis present

## 2024-05-17 DIAGNOSIS — Z83438 Family history of other disorder of lipoprotein metabolism and other lipidemia: Secondary | ICD-10-CM

## 2024-05-17 DIAGNOSIS — Z96641 Presence of right artificial hip joint: Secondary | ICD-10-CM | POA: Diagnosis not present

## 2024-05-17 DIAGNOSIS — M25551 Pain in right hip: Secondary | ICD-10-CM | POA: Diagnosis not present

## 2024-05-17 DIAGNOSIS — Z96642 Presence of left artificial hip joint: Secondary | ICD-10-CM | POA: Diagnosis not present

## 2024-05-17 HISTORY — PX: INTRAOPERATIVE TRANSESOPHAGEAL ECHOCARDIOGRAM: SHX5062

## 2024-05-17 HISTORY — PX: CORONARY ARTERY BYPASS GRAFT: SHX141

## 2024-05-17 LAB — PLATELET COUNT: Platelets: 147 K/uL — ABNORMAL LOW (ref 150–400)

## 2024-05-17 LAB — POCT I-STAT 7, (LYTES, BLD GAS, ICA,H+H)
Acid-base deficit: 7 mmol/L — ABNORMAL HIGH (ref 0.0–2.0)
Acid-base deficit: 1 mmol/L (ref 0.0–2.0)
Acid-base deficit: 2 mmol/L (ref 0.0–2.0)
Acid-base deficit: 2 mmol/L (ref 0.0–2.0)
Acid-base deficit: 4 mmol/L — ABNORMAL HIGH (ref 0.0–2.0)
Acid-base deficit: 4 mmol/L — ABNORMAL HIGH (ref 0.0–2.0)
Acid-base deficit: 3 mmol/L — ABNORMAL HIGH (ref 0.0–2.0)
Bicarbonate: 19 mmol/L — ABNORMAL LOW (ref 20.0–28.0)
Bicarbonate: 22.4 mmol/L (ref 20.0–28.0)
Bicarbonate: 22.9 mmol/L (ref 20.0–28.0)
Bicarbonate: 22.8 mmol/L (ref 20.0–28.0)
Bicarbonate: 22.1 mmol/L (ref 20.0–28.0)
Bicarbonate: 22.3 mmol/L (ref 20.0–28.0)
Bicarbonate: 23.3 mmol/L (ref 20.0–28.0)
Calcium, Ion: 1.14 mmol/L — ABNORMAL LOW (ref 1.15–1.40)
Calcium, Ion: 0.99 mmol/L — ABNORMAL LOW (ref 1.15–1.40)
Calcium, Ion: 1.12 mmol/L — ABNORMAL LOW (ref 1.15–1.40)
Calcium, Ion: 1.17 mmol/L (ref 1.15–1.40)
Calcium, Ion: 1.17 mmol/L (ref 1.15–1.40)
Calcium, Ion: 1.18 mmol/L (ref 1.15–1.40)
Calcium, Ion: 1.18 mmol/L (ref 1.15–1.40)
HCT: 33 % — ABNORMAL LOW (ref 36.0–46.0)
HCT: 21 % — ABNORMAL LOW (ref 36.0–46.0)
HCT: 25 % — ABNORMAL LOW (ref 36.0–46.0)
HCT: 25 % — ABNORMAL LOW (ref 36.0–46.0)
HCT: 24 % — ABNORMAL LOW (ref 36.0–46.0)
HCT: 25 % — ABNORMAL LOW (ref 36.0–46.0)
HCT: 27 % — ABNORMAL LOW (ref 36.0–46.0)
Hemoglobin: 11.2 g/dL — ABNORMAL LOW (ref 12.0–15.0)
Hemoglobin: 7.1 g/dL — ABNORMAL LOW (ref 12.0–15.0)
Hemoglobin: 8.5 g/dL — ABNORMAL LOW (ref 12.0–15.0)
Hemoglobin: 8.5 g/dL — ABNORMAL LOW (ref 12.0–15.0)
Hemoglobin: 8.2 g/dL — ABNORMAL LOW (ref 12.0–15.0)
Hemoglobin: 8.5 g/dL — ABNORMAL LOW (ref 12.0–15.0)
Hemoglobin: 9.2 g/dL — ABNORMAL LOW (ref 12.0–15.0)
O2 Saturation: 100 %
O2 Saturation: 100 %
O2 Saturation: 100 %
O2 Saturation: 99 %
O2 Saturation: 98 %
O2 Saturation: 98 %
O2 Saturation: 97 %
Patient temperature: 34.7
Patient temperature: 36.5
Patient temperature: 36.6
Patient temperature: 37.1
Potassium: 3.3 mmol/L — ABNORMAL LOW (ref 3.5–5.1)
Potassium: 4.1 mmol/L (ref 3.5–5.1)
Potassium: 4.3 mmol/L (ref 3.5–5.1)
Potassium: 4.1 mmol/L (ref 3.5–5.1)
Potassium: 4.7 mmol/L (ref 3.5–5.1)
Potassium: 4.7 mmol/L (ref 3.5–5.1)
Potassium: 4.6 mmol/L (ref 3.5–5.1)
Sodium: 143 mmol/L (ref 135–145)
Sodium: 137 mmol/L (ref 135–145)
Sodium: 138 mmol/L (ref 135–145)
Sodium: 139 mmol/L (ref 135–145)
Sodium: 138 mmol/L (ref 135–145)
Sodium: 138 mmol/L (ref 135–145)
Sodium: 139 mmol/L (ref 135–145)
TCO2: 20 mmol/L — ABNORMAL LOW (ref 22–32)
TCO2: 23 mmol/L (ref 22–32)
TCO2: 24 mmol/L (ref 22–32)
TCO2: 24 mmol/L (ref 22–32)
TCO2: 24 mmol/L (ref 22–32)
TCO2: 24 mmol/L (ref 22–32)
TCO2: 25 mmol/L (ref 22–32)
pCO2 arterial: 38.2 mmHg (ref 32–48)
pCO2 arterial: 32.4 mmHg (ref 32–48)
pCO2 arterial: 36.1 mmHg (ref 32–48)
pCO2 arterial: 35.6 mmHg (ref 32–48)
pCO2 arterial: 45.1 mmHg (ref 32–48)
pCO2 arterial: 45.8 mmHg (ref 32–48)
pCO2 arterial: 46.8 mmHg (ref 32–48)
pH, Arterial: 7.305 — ABNORMAL LOW (ref 7.35–7.45)
pH, Arterial: 7.447 (ref 7.35–7.45)
pH, Arterial: 7.41 (ref 7.35–7.45)
pH, Arterial: 7.404 (ref 7.35–7.45)
pH, Arterial: 7.29 — ABNORMAL LOW (ref 7.35–7.45)
pH, Arterial: 7.299 — ABNORMAL LOW (ref 7.35–7.45)
pH, Arterial: 7.306 — ABNORMAL LOW (ref 7.35–7.45)
pO2, Arterial: 401 mmHg — ABNORMAL HIGH (ref 83–108)
pO2, Arterial: 421 mmHg — ABNORMAL HIGH (ref 83–108)
pO2, Arterial: 345 mmHg — ABNORMAL HIGH (ref 83–108)
pO2, Arterial: 115 mmHg — ABNORMAL HIGH (ref 83–108)
pO2, Arterial: 109 mmHg — ABNORMAL HIGH (ref 83–108)
pO2, Arterial: 115 mmHg — ABNORMAL HIGH (ref 83–108)
pO2, Arterial: 106 mmHg (ref 83–108)

## 2024-05-17 LAB — POCT I-STAT EG7
Acid-base deficit: 1 mmol/L (ref 0.0–2.0)
Bicarbonate: 23.3 mmol/L (ref 20.0–28.0)
Calcium, Ion: 1.03 mmol/L — ABNORMAL LOW (ref 1.15–1.40)
HCT: 22 % — ABNORMAL LOW (ref 36.0–46.0)
Hemoglobin: 7.5 g/dL — ABNORMAL LOW (ref 12.0–15.0)
O2 Saturation: 80 %
Potassium: 5.1 mmol/L (ref 3.5–5.1)
Sodium: 138 mmol/L (ref 135–145)
TCO2: 24 mmol/L (ref 22–32)
pCO2, Ven: 34.9 mmHg — ABNORMAL LOW (ref 44–60)
pH, Ven: 7.433 — ABNORMAL HIGH (ref 7.25–7.43)
pO2, Ven: 42 mmHg (ref 32–45)

## 2024-05-17 LAB — POCT I-STAT, CHEM 8
BUN: 13 mg/dL (ref 8–23)
BUN: 14 mg/dL (ref 8–23)
BUN: 12 mg/dL (ref 8–23)
BUN: 12 mg/dL (ref 8–23)
Calcium, Ion: 1.21 mmol/L (ref 1.15–1.40)
Calcium, Ion: 1.21 mmol/L (ref 1.15–1.40)
Calcium, Ion: 1.14 mmol/L — ABNORMAL LOW (ref 1.15–1.40)
Calcium, Ion: 1.13 mmol/L — ABNORMAL LOW (ref 1.15–1.40)
Chloride: 105 mmol/L (ref 98–111)
Chloride: 105 mmol/L (ref 98–111)
Chloride: 102 mmol/L (ref 98–111)
Chloride: 102 mmol/L (ref 98–111)
Creatinine, Ser: 0.7 mg/dL (ref 0.44–1.00)
Creatinine, Ser: 0.7 mg/dL (ref 0.44–1.00)
Creatinine, Ser: 0.6 mg/dL (ref 0.44–1.00)
Creatinine, Ser: 0.7 mg/dL (ref 0.44–1.00)
Glucose, Bld: 110 mg/dL — ABNORMAL HIGH (ref 70–99)
Glucose, Bld: 117 mg/dL — ABNORMAL HIGH (ref 70–99)
Glucose, Bld: 133 mg/dL — ABNORMAL HIGH (ref 70–99)
Glucose, Bld: 136 mg/dL — ABNORMAL HIGH (ref 70–99)
HCT: 31 % — ABNORMAL LOW (ref 36.0–46.0)
HCT: 29 % — ABNORMAL LOW (ref 36.0–46.0)
HCT: 22 % — ABNORMAL LOW (ref 36.0–46.0)
HCT: 22 % — ABNORMAL LOW (ref 36.0–46.0)
Hemoglobin: 10.5 g/dL — ABNORMAL LOW (ref 12.0–15.0)
Hemoglobin: 9.9 g/dL — ABNORMAL LOW (ref 12.0–15.0)
Hemoglobin: 7.5 g/dL — ABNORMAL LOW (ref 12.0–15.0)
Hemoglobin: 7.5 g/dL — ABNORMAL LOW (ref 12.0–15.0)
Potassium: 3.6 mmol/L (ref 3.5–5.1)
Potassium: 4.1 mmol/L (ref 3.5–5.1)
Potassium: 4.3 mmol/L (ref 3.5–5.1)
Potassium: 4.3 mmol/L (ref 3.5–5.1)
Sodium: 142 mmol/L (ref 135–145)
Sodium: 138 mmol/L (ref 135–145)
Sodium: 138 mmol/L (ref 135–145)
Sodium: 137 mmol/L (ref 135–145)
TCO2: 25 mmol/L (ref 22–32)
TCO2: 23 mmol/L (ref 22–32)
TCO2: 26 mmol/L (ref 22–32)
TCO2: 25 mmol/L (ref 22–32)

## 2024-05-17 LAB — CBC
HCT: 27.4 % — ABNORMAL LOW (ref 36.0–46.0)
HCT: 28.4 % — ABNORMAL LOW (ref 36.0–46.0)
Hemoglobin: 9 g/dL — ABNORMAL LOW (ref 12.0–15.0)
Hemoglobin: 9.3 g/dL — ABNORMAL LOW (ref 12.0–15.0)
MCH: 31.1 pg (ref 26.0–34.0)
MCH: 31.4 pg (ref 26.0–34.0)
MCHC: 32.8 g/dL (ref 30.0–36.0)
MCHC: 32.7 g/dL (ref 30.0–36.0)
MCV: 94.8 fL (ref 80.0–100.0)
MCV: 95.9 fL (ref 80.0–100.0)
Platelets: 157 K/uL (ref 150–400)
Platelets: 147 K/uL — ABNORMAL LOW (ref 150–400)
RBC: 2.89 MIL/uL — ABNORMAL LOW (ref 3.87–5.11)
RBC: 2.96 MIL/uL — ABNORMAL LOW (ref 3.87–5.11)
RDW: 12.8 % (ref 11.5–15.5)
RDW: 12.9 % (ref 11.5–15.5)
WBC: 9 K/uL (ref 4.0–10.5)
WBC: 9 K/uL (ref 4.0–10.5)
nRBC: 0 % (ref 0.0–0.2)
nRBC: 0 % (ref 0.0–0.2)

## 2024-05-17 LAB — BASIC METABOLIC PANEL WITH GFR
Anion gap: 8 (ref 5–15)
BUN: 10 mg/dL (ref 8–23)
CO2: 22 mmol/L (ref 22–32)
Calcium: 7.9 mg/dL — ABNORMAL LOW (ref 8.9–10.3)
Chloride: 107 mmol/L (ref 98–111)
Creatinine, Ser: 0.82 mg/dL (ref 0.44–1.00)
GFR, Estimated: >60 mL/min (ref >60)
Glucose, Bld: 121 mg/dL — ABNORMAL HIGH (ref 70–99)
Potassium: 4.6 mmol/L (ref 3.5–5.1)
Sodium: 137 mmol/L (ref 135–145)

## 2024-05-17 LAB — MAGNESIUM: Magnesium: 3 mg/dL — ABNORMAL HIGH (ref 1.7–2.4)

## 2024-05-17 LAB — PREPARE RBC (CROSSMATCH)

## 2024-05-17 LAB — PROTIME-INR
INR: 1.3 — ABNORMAL HIGH (ref 0.8–1.2)
Prothrombin Time: 17.4 s — ABNORMAL HIGH (ref 11.4–15.2)

## 2024-05-17 LAB — ECHO INTRAOPERATIVE TEE
AR max vel: 2.63 cm2
AV Area VTI: 2.42 cm2
AV Area mean vel: 2.41 cm2
AV Mean grad: 6 mmHg
AV Peak grad: 9.9 mmHg
Ao pk vel: 1.57 m/s
Area-P 1/2: 3.12 cm2
Height: 64.5 in
Weight: 3040 [oz_av]

## 2024-05-17 LAB — GLUCOSE, CAPILLARY
Glucose-Capillary: 107 mg/dL — ABNORMAL HIGH (ref 70–99)
Glucose-Capillary: 145 mg/dL — ABNORMAL HIGH (ref 70–99)
Glucose-Capillary: 129 mg/dL — ABNORMAL HIGH (ref 70–99)
Glucose-Capillary: 121 mg/dL — ABNORMAL HIGH (ref 70–99)
Glucose-Capillary: 125 mg/dL — ABNORMAL HIGH (ref 70–99)
Glucose-Capillary: 120 mg/dL — ABNORMAL HIGH (ref 70–99)
Glucose-Capillary: 148 mg/dL — ABNORMAL HIGH (ref 70–99)

## 2024-05-17 LAB — HEMOGLOBIN AND HEMATOCRIT, BLOOD
HCT: 23.4 % — ABNORMAL LOW (ref 36.0–46.0)
Hemoglobin: 7.7 g/dL — ABNORMAL LOW (ref 12.0–15.0)

## 2024-05-17 LAB — APTT: aPTT: 37 s — ABNORMAL HIGH (ref 24–36)

## 2024-05-17 SURGERY — CORONARY ARTERY BYPASS GRAFTING (CABG)
Anesthesia: General | Site: Chest

## 2024-05-17 MED ORDER — LAMOTRIGINE 100 MG PO TABS
200.0000 mg | ORAL_TABLET | Freq: Every morning | ORAL | Status: DC
  Administered 2024-05-18 – 2024-05-25 (×8): 200 mg via ORAL
  Filled 2024-05-17 (×9): qty 2

## 2024-05-17 MED ORDER — SODIUM CHLORIDE 0.9% FLUSH
10.0000 mL | Freq: Two times a day (BID) | INTRAVENOUS | Status: DC
  Administered 2024-05-17: 40 mL
  Administered 2024-05-17 – 2024-05-18 (×2): 10 mL
  Administered 2024-05-18: 40 mL
  Administered 2024-05-19: 20 mL
  Administered 2024-05-19 – 2024-05-21 (×4): 10 mL

## 2024-05-17 MED ORDER — LIDOCAINE 2% (20 MG/ML) 5 ML SYRINGE
INTRAMUSCULAR | Status: DC | PRN
  Administered 2024-05-17: 100 mg via INTRAVENOUS

## 2024-05-17 MED ORDER — CHLORHEXIDINE GLUCONATE 0.12 % MT SOLN
15.0000 mL | OROMUCOSAL | Status: AC
  Administered 2024-05-17: 15 mL via OROMUCOSAL
  Filled 2024-05-17: qty 15

## 2024-05-17 MED ORDER — SODIUM CHLORIDE 0.9 % IV SOLN: 250.0000 mL | INTRAVENOUS | Status: AC

## 2024-05-17 MED ORDER — CEFAZOLIN SODIUM-DEXTROSE 2-4 GM/100ML-% IV SOLN
2.0000 g | Freq: Three times a day (TID) | INTRAVENOUS | Status: AC
  Administered 2024-05-17 – 2024-05-19 (×6): 2 g via INTRAVENOUS
  Filled 2024-05-17 (×6): qty 100

## 2024-05-17 MED ORDER — LACTATED RINGERS IV SOLN
INTRAVENOUS | Status: AC
  Administered 2024-05-18: 20 mL/h via INTRAVENOUS

## 2024-05-17 MED ORDER — HEMOSTATIC AGENTS (NO CHARGE) OPTIME
TOPICAL | Status: DC | PRN
  Administered 2024-05-17: 1 via TOPICAL

## 2024-05-17 MED ORDER — NITROGLYCERIN IN D5W 200-5 MCG/ML-% IV SOLN: 7.0000 ug/min | INTRAVENOUS | Status: DC

## 2024-05-17 MED ORDER — SODIUM CHLORIDE 0.45 % IV SOLN: INTRAVENOUS | Status: AC | PRN

## 2024-05-17 MED ORDER — SODIUM CHLORIDE 0.9% FLUSH
3.0000 mL | Freq: Two times a day (BID) | INTRAVENOUS | Status: DC
  Administered 2024-05-18 – 2024-05-21 (×7): 3 mL via INTRAVENOUS

## 2024-05-17 MED ORDER — DEXTROSE 50 % IV SOLN: 0.0000 mL | INTRAVENOUS | Status: DC | PRN

## 2024-05-17 MED ORDER — ASPIRIN 81 MG PO CHEW
324.0000 mg | CHEWABLE_TABLET | Freq: Every day | ORAL | Status: DC
Start: 2024-05-18 — End: 2024-05-19

## 2024-05-17 MED ORDER — PROPOFOL 1000 MG/100ML IV EMUL
INTRAVENOUS | Status: AC
  Filled 2024-05-17: qty 100

## 2024-05-17 MED ORDER — METOCLOPRAMIDE HCL 5 MG/ML IJ SOLN
10.0000 mg | Freq: Four times a day (QID) | INTRAMUSCULAR | Status: AC
  Administered 2024-05-17 – 2024-05-18 (×6): 10 mg via INTRAVENOUS
  Filled 2024-05-17 (×6): qty 2

## 2024-05-17 MED ORDER — ROCURONIUM BROMIDE 10 MG/ML (PF) SYRINGE
PREFILLED_SYRINGE | INTRAVENOUS | Status: DC | PRN
  Administered 2024-05-17 (×2): 50 mg via INTRAVENOUS
  Administered 2024-05-17: 100 mg via INTRAVENOUS

## 2024-05-17 MED ORDER — INSULIN REGULAR(HUMAN) IN NACL 100-0.9 UT/100ML-% IV SOLN
INTRAVENOUS | Status: DC
Start: 2024-05-17 — End: 2024-05-18

## 2024-05-17 MED ORDER — TRAMADOL HCL 50 MG PO TABS
50.0000 mg | ORAL_TABLET | ORAL | Status: DC | PRN
  Administered 2024-05-18 – 2024-05-19 (×4): 100 mg via ORAL
  Administered 2024-05-19: 50 mg via ORAL
  Administered 2024-05-19 – 2024-05-21 (×2): 100 mg via ORAL
  Administered 2024-05-21: 50 mg via ORAL
  Administered 2024-05-22 (×3): 100 mg via ORAL
  Administered 2024-05-23: 50 mg via ORAL
  Filled 2024-05-17: qty 1
  Filled 2024-05-17 (×3): qty 2
  Filled 2024-05-17: qty 1
  Filled 2024-05-17 (×6): qty 2
  Filled 2024-05-17: qty 1

## 2024-05-17 MED ORDER — VANCOMYCIN HCL IN DEXTROSE 1-5 GM/200ML-% IV SOLN
1000.0000 mg | Freq: Once | INTRAVENOUS | Status: AC
  Administered 2024-05-17: 1000 mg via INTRAVENOUS
  Filled 2024-05-17: qty 200

## 2024-05-17 MED ORDER — LACTATED RINGERS IV SOLN: INTRAVENOUS | Status: AC

## 2024-05-17 MED ORDER — BISACODYL 5 MG PO TBEC
10.0000 mg | DELAYED_RELEASE_TABLET | Freq: Every day | ORAL | Status: DC
  Administered 2024-05-18 – 2024-05-19 (×2): 10 mg via ORAL
  Filled 2024-05-17 (×2): qty 2

## 2024-05-17 MED ORDER — LACTATED RINGERS IV SOLN: INTRAVENOUS | Status: DC | PRN

## 2024-05-17 MED ORDER — MAGNESIUM SULFATE 4 GM/100ML IV SOLN
4.0000 g | Freq: Once | INTRAVENOUS | Status: AC
  Administered 2024-05-17: 4 g via INTRAVENOUS
  Filled 2024-05-17: qty 100

## 2024-05-17 MED ORDER — ATORVASTATIN CALCIUM 80 MG PO TABS
80.0000 mg | ORAL_TABLET | Freq: Every day | ORAL | Status: DC
  Administered 2024-05-17 – 2024-05-25 (×9): 80 mg via ORAL
  Filled 2024-05-17 (×10): qty 1

## 2024-05-17 MED ORDER — PANTOPRAZOLE SODIUM 40 MG PO TBEC
40.0000 mg | DELAYED_RELEASE_TABLET | Freq: Every day | ORAL | Status: DC
  Administered 2024-05-19 – 2024-05-25 (×7): 40 mg via ORAL
  Filled 2024-05-17 (×7): qty 1

## 2024-05-17 MED ORDER — PROPOFOL 10 MG/ML IV BOLUS
INTRAVENOUS | Status: AC
  Filled 2024-05-17: qty 20

## 2024-05-17 MED ORDER — FENTANYL CITRATE (PF) 250 MCG/5ML IJ SOLN
INTRAMUSCULAR | Status: AC
  Filled 2024-05-17: qty 5

## 2024-05-17 MED ORDER — HEPARIN SODIUM (PORCINE) 1000 UNIT/ML IJ SOLN
INTRAMUSCULAR | Status: DC | PRN
Start: 2024-05-17 — End: 2024-05-17
  Administered 2024-05-17: 31000 [IU] via INTRAVENOUS

## 2024-05-17 MED ORDER — BISACODYL 10 MG RE SUPP: 10.0000 mg | Freq: Every day | RECTAL | Status: DC

## 2024-05-17 MED ORDER — SODIUM BICARBONATE 8.4 % IV SOLN
50.0000 meq | Freq: Once | INTRAVENOUS | Status: AC
Start: 2024-05-17 — End: 2024-05-17
  Administered 2024-05-17: 50 meq via INTRAVENOUS

## 2024-05-17 MED ORDER — PROPOFOL 500 MG/50ML IV EMUL
INTRAVENOUS | Status: DC | PRN
Start: 2024-05-17 — End: 2024-05-17
  Administered 2024-05-17: 50 ug/kg/min via INTRAVENOUS

## 2024-05-17 MED ORDER — SODIUM CHLORIDE 0.9 % IV SOLN: INTRAVENOUS | Status: DC | PRN

## 2024-05-17 MED ORDER — FENTANYL CITRATE (PF) 250 MCG/5ML IJ SOLN
INTRAMUSCULAR | Status: DC | PRN
  Administered 2024-05-17: 100 ug via INTRAVENOUS
  Administered 2024-05-17: 200 ug via INTRAVENOUS
  Administered 2024-05-17: 50 ug via INTRAVENOUS
  Administered 2024-05-17: 250 ug via INTRAVENOUS
  Administered 2024-05-17: 100 ug via INTRAVENOUS
  Administered 2024-05-17: 50 ug via INTRAVENOUS

## 2024-05-17 MED ORDER — SODIUM CHLORIDE 0.9% FLUSH: 3.0000 mL | INTRAVENOUS | Status: DC | PRN

## 2024-05-17 MED ORDER — ACETAMINOPHEN 160 MG/5ML PO SOLN: 1000.0000 mg | Freq: Four times a day (QID) | ORAL | Status: AC

## 2024-05-17 MED ORDER — CHLORHEXIDINE GLUCONATE 4 % EX SOLN: 30.0000 mL | CUTANEOUS | Status: DC

## 2024-05-17 MED ORDER — EPHEDRINE 5 MG/ML INJ
INTRAVENOUS | Status: AC
Start: 2024-05-17 — End: 2024-05-17
  Filled 2024-05-17: qty 5

## 2024-05-17 MED ORDER — ASPIRIN 81 MG PO CHEW
324.0000 mg | CHEWABLE_TABLET | Freq: Once | ORAL | Status: AC
  Administered 2024-05-17: 324 mg via ORAL
  Filled 2024-05-17: qty 4

## 2024-05-17 MED ORDER — LEVOTHYROXINE SODIUM 100 MCG PO TABS
100.0000 ug | ORAL_TABLET | Freq: Every day | ORAL | Status: DC
  Administered 2024-05-18 – 2024-05-25 (×8): 100 ug via ORAL
  Filled 2024-05-17 (×8): qty 1

## 2024-05-17 MED ORDER — ACETAMINOPHEN 500 MG PO TABS
1000.0000 mg | ORAL_TABLET | Freq: Four times a day (QID) | ORAL | Status: AC
  Administered 2024-05-17 – 2024-05-22 (×18): 1000 mg via ORAL
  Filled 2024-05-17 (×19): qty 2

## 2024-05-17 MED ORDER — METOPROLOL TARTRATE 12.5 MG HALF TABLET
12.5000 mg | ORAL_TABLET | Freq: Two times a day (BID) | ORAL | Status: DC
  Administered 2024-05-18 – 2024-05-19 (×3): 12.5 mg via ORAL
  Filled 2024-05-17 (×4): qty 1

## 2024-05-17 MED ORDER — HEPARIN SODIUM (PORCINE) 1000 UNIT/ML IJ SOLN
INTRAMUSCULAR | Status: AC
  Filled 2024-05-17: qty 1

## 2024-05-17 MED ORDER — CHLORHEXIDINE GLUCONATE 0.12 % MT SOLN
OROMUCOSAL | Status: AC
  Administered 2024-05-17: 15 mL via OROMUCOSAL
  Filled 2024-05-17: qty 15

## 2024-05-17 MED ORDER — LIDOCAINE 2% (20 MG/ML) 5 ML SYRINGE
INTRAMUSCULAR | Status: AC
  Filled 2024-05-17: qty 5

## 2024-05-17 MED ORDER — ASPIRIN 325 MG PO TBEC
325.0000 mg | DELAYED_RELEASE_TABLET | Freq: Every day | ORAL | Status: DC
  Administered 2024-05-18: 325 mg via ORAL
  Filled 2024-05-17: qty 1

## 2024-05-17 MED ORDER — DOCUSATE SODIUM 100 MG PO CAPS
200.0000 mg | ORAL_CAPSULE | Freq: Every day | ORAL | Status: DC
  Administered 2024-05-18 – 2024-05-19 (×2): 200 mg via ORAL
  Filled 2024-05-17 (×2): qty 2

## 2024-05-17 MED ORDER — PHENYLEPHRINE 80 MCG/ML (10ML) SYRINGE FOR IV PUSH (FOR BLOOD PRESSURE SUPPORT)
PREFILLED_SYRINGE | INTRAVENOUS | Status: DC | PRN
  Administered 2024-05-17 (×2): 80 ug via INTRAVENOUS

## 2024-05-17 MED ORDER — METOPROLOL TARTRATE 25 MG/10 ML ORAL SUSPENSION: 12.5000 mg | Freq: Two times a day (BID) | ORAL | Status: DC

## 2024-05-17 MED ORDER — MIDAZOLAM HCL 2 MG/2ML IJ SOLN: 2.0000 mg | INTRAMUSCULAR | Status: DC | PRN

## 2024-05-17 MED ORDER — CHLORHEXIDINE GLUCONATE CLOTH 2 % EX PADS
6.0000 | MEDICATED_PAD | Freq: Every day | CUTANEOUS | Status: DC
  Administered 2024-05-17 – 2024-05-21 (×5): 6 via TOPICAL

## 2024-05-17 MED ORDER — CLOPIDOGREL BISULFATE 75 MG PO TABS
75.0000 mg | ORAL_TABLET | Freq: Every day | ORAL | Status: DC
Start: 2024-05-18 — End: 2024-05-18
  Filled 2024-05-17: qty 1

## 2024-05-17 MED ORDER — LAMOTRIGINE 100 MG PO TABS
100.0000 mg | ORAL_TABLET | Freq: Every day | ORAL | Status: DC
Start: 2024-05-17 — End: 2024-05-25
  Administered 2024-05-17 – 2024-05-23 (×7): 100 mg via ORAL
  Filled 2024-05-17 (×7): qty 1

## 2024-05-17 MED ORDER — METOPROLOL TARTRATE 12.5 MG HALF TABLET: 12.5000 mg | ORAL_TABLET | Freq: Once | ORAL | Status: DC

## 2024-05-17 MED ORDER — ~~LOC~~ CARDIAC SURGERY, PATIENT & FAMILY EDUCATION
Freq: Once | Status: DC
  Filled 2024-05-17: qty 1

## 2024-05-17 MED ORDER — ROCURONIUM BROMIDE 10 MG/ML (PF) SYRINGE
PREFILLED_SYRINGE | INTRAVENOUS | Status: AC
  Filled 2024-05-17: qty 10

## 2024-05-17 MED ORDER — SUGAMMADEX SODIUM 200 MG/2ML IV SOLN
INTRAVENOUS | Status: DC | PRN
  Administered 2024-05-17: 200 mg via INTRAVENOUS

## 2024-05-17 MED ORDER — ACETAMINOPHEN 160 MG/5ML PO SOLN
650.0000 mg | Freq: Once | ORAL | Status: DC
Start: 2024-05-17 — End: 2024-05-21

## 2024-05-17 MED ORDER — PROTAMINE SULFATE 10 MG/ML IV SOLN
INTRAVENOUS | Status: AC
  Filled 2024-05-17: qty 25

## 2024-05-17 MED ORDER — PLASMA-LYTE A IV SOLN: INTRAVENOUS | Status: DC | PRN

## 2024-05-17 MED ORDER — MIDAZOLAM HCL (PF) 5 MG/ML IJ SOLN
INTRAMUSCULAR | Status: DC | PRN
  Administered 2024-05-17: 1 mg via INTRAVENOUS
  Administered 2024-05-17 (×2): 2 mg via INTRAVENOUS

## 2024-05-17 MED ORDER — MIDAZOLAM HCL (PF) 10 MG/2ML IJ SOLN
INTRAMUSCULAR | Status: AC
Start: 2024-05-17 — End: 2024-05-17
  Filled 2024-05-17: qty 2

## 2024-05-17 MED ORDER — THROMBIN 20000 UNITS EX SOLR: OROMUCOSAL | Status: DC | PRN

## 2024-05-17 MED ORDER — OXYCODONE HCL 5 MG PO TABS
5.0000 mg | ORAL_TABLET | ORAL | Status: DC | PRN
  Administered 2024-05-17 – 2024-05-19 (×10): 10 mg via ORAL
  Administered 2024-05-20: 5 mg via ORAL
  Administered 2024-05-23 – 2024-05-25 (×4): 10 mg via ORAL
  Filled 2024-05-17 (×4): qty 2
  Filled 2024-05-17: qty 1
  Filled 2024-05-17 (×10): qty 2

## 2024-05-17 MED ORDER — PROPOFOL 10 MG/ML IV BOLUS
INTRAVENOUS | Status: DC | PRN
  Administered 2024-05-17: 60 mg via INTRAVENOUS
  Administered 2024-05-17: 30 mg via INTRAVENOUS

## 2024-05-17 MED ORDER — METOPROLOL TARTRATE 5 MG/5ML IV SOLN
2.5000 mg | INTRAVENOUS | Status: DC | PRN
  Administered 2024-05-18: 2.5 mg via INTRAVENOUS
  Filled 2024-05-17 (×2): qty 5

## 2024-05-17 MED ORDER — 0.9 % SODIUM CHLORIDE (POUR BTL) OPTIME
TOPICAL | Status: DC | PRN
  Administered 2024-05-17: 5000 mL

## 2024-05-17 MED ORDER — NITROGLYCERIN 0.2 MG/ML ON CALL CATH LAB
INTRAVENOUS | Status: DC | PRN
  Administered 2024-05-17 (×4): 100 ug via INTRAVENOUS

## 2024-05-17 MED ORDER — PROTAMINE SULFATE 10 MG/ML IV SOLN
INTRAVENOUS | Status: DC | PRN
  Administered 2024-05-17: 310 mg via INTRAVENOUS

## 2024-05-17 MED ORDER — ALBUMIN HUMAN 5 % IV SOLN
250.0000 mL | INTRAVENOUS | Status: DC | PRN
  Administered 2024-05-17 (×4): 12.5 g via INTRAVENOUS
  Filled 2024-05-17 (×2): qty 250

## 2024-05-17 MED ORDER — THROMBIN (RECOMBINANT) 20000 UNITS EX SOLR
CUTANEOUS | Status: AC
  Filled 2024-05-17: qty 20000

## 2024-05-17 MED ORDER — TRAZODONE HCL 50 MG PO TABS
150.0000 mg | ORAL_TABLET | Freq: Every day | ORAL | Status: DC
  Administered 2024-05-18 – 2024-05-24 (×7): 150 mg via ORAL
  Filled 2024-05-17 (×8): qty 1

## 2024-05-17 MED ORDER — ONDANSETRON HCL 4 MG/2ML IJ SOLN
INTRAMUSCULAR | Status: AC
  Filled 2024-05-17: qty 2

## 2024-05-17 MED ORDER — ONDANSETRON HCL 4 MG/2ML IJ SOLN: 4.0000 mg | Freq: Four times a day (QID) | INTRAMUSCULAR | Status: DC | PRN

## 2024-05-17 MED ORDER — PHENYLEPHRINE HCL-NACL 20-0.9 MG/250ML-% IV SOLN
0.0000 ug/min | INTRAVENOUS | Status: DC
  Administered 2024-05-18: 20 ug/min via INTRAVENOUS
  Filled 2024-05-17 (×2): qty 250

## 2024-05-17 MED ORDER — POTASSIUM CHLORIDE 10 MEQ/50ML IV SOLN: 10.0000 meq | INTRAVENOUS | Status: DC

## 2024-05-17 MED ORDER — PANTOPRAZOLE SODIUM 40 MG IV SOLR
40.0000 mg | Freq: Every day | INTRAVENOUS | Status: AC
  Administered 2024-05-17 – 2024-05-18 (×2): 40 mg via INTRAVENOUS
  Filled 2024-05-17 (×2): qty 10

## 2024-05-17 MED ORDER — CHLORHEXIDINE GLUCONATE 0.12 % MT SOLN: 15.0000 mL | Freq: Once | OROMUCOSAL | Status: AC

## 2024-05-17 MED ORDER — SODIUM CHLORIDE 0.9% FLUSH: 10.0000 mL | INTRAVENOUS | Status: DC | PRN

## 2024-05-17 MED ORDER — PHENYLEPHRINE 80 MCG/ML (10ML) SYRINGE FOR IV PUSH (FOR BLOOD PRESSURE SUPPORT)
PREFILLED_SYRINGE | INTRAVENOUS | Status: AC
  Filled 2024-05-17: qty 10

## 2024-05-17 MED ORDER — SODIUM CHLORIDE 0.9 % IV SOLN
INTRAVENOUS | Status: AC
Start: 2024-05-17 — End: 2024-05-18

## 2024-05-17 MED ORDER — MORPHINE SULFATE (PF) 2 MG/ML IV SOLN
1.0000 mg | INTRAVENOUS | Status: DC | PRN
  Administered 2024-05-17: 2 mg via INTRAVENOUS
  Administered 2024-05-17: 1 mg via INTRAVENOUS
  Administered 2024-05-17: 2 mg via INTRAVENOUS
  Filled 2024-05-17 (×2): qty 1
  Filled 2024-05-17: qty 2

## 2024-05-17 MED ORDER — DEXMEDETOMIDINE HCL IN NACL 400 MCG/100ML IV SOLN
0.0000 ug/kg/h | INTRAVENOUS | Status: DC
  Administered 2024-05-17: 0.4 ug/kg/h via INTRAVENOUS
  Filled 2024-05-17: qty 100

## 2024-05-17 SURGICAL SUPPLY — 82 items
BAG DECANTER FOR FLEXI CONT (MISCELLANEOUS) ×3 IMPLANT
BLADE CLIPPER SURG (BLADE) ×3 IMPLANT
BLADE STERNUM SYSTEM 6 (BLADE) ×3 IMPLANT
BNDG ELASTIC 4INX 5YD STR LF (GAUZE/BANDAGES/DRESSINGS) IMPLANT
BNDG ELASTIC 4X5.8 VLCR STR LF (GAUZE/BANDAGES/DRESSINGS) ×3 IMPLANT
BNDG ELASTIC 6INX 5YD STR LF (GAUZE/BANDAGES/DRESSINGS) ×3 IMPLANT
BNDG GAUZE DERMACEA FLUFF 4 (GAUZE/BANDAGES/DRESSINGS) ×3 IMPLANT
CANISTER SUCTION 3000ML PPV (SUCTIONS) ×3 IMPLANT
CANNULA AORTIC ROOT 9FR (CANNULA) ×3 IMPLANT
CANNULA ARTERIAL NVNT 3/8 20FR (MISCELLANEOUS) IMPLANT
CANNULA MC2 2 STG 36/46 CONN (CANNULA) IMPLANT
CATH ROBINSON RED A/P 18FR (CATHETERS) ×6 IMPLANT
CATH THORACIC 28FR (CATHETERS) ×3 IMPLANT
CATH THORACIC 36FR (CATHETERS) ×3 IMPLANT
CATH THORACIC 36FR RT ANG (CATHETERS) ×3 IMPLANT
CLIP TI WIDE RED SMALL 24 (CLIP) IMPLANT
CONTAINER PROTECT SURGISLUSH (MISCELLANEOUS) ×6 IMPLANT
DERMABOND ADVANCED .7 DNX12 (GAUZE/BANDAGES/DRESSINGS) IMPLANT
DRAPE SRG 135X102X78XABS (DRAPES) ×3 IMPLANT
DRAPE WARM FLUID 44X44 (DRAPES) ×3 IMPLANT
DRSG COVADERM 4X14 (GAUZE/BANDAGES/DRESSINGS) ×3 IMPLANT
ELECT CAUTERY BLADE 6.4 (BLADE) ×3 IMPLANT
ELECTRODE BLDE 4.0 EZ CLN MEGD (MISCELLANEOUS) ×3 IMPLANT
ELECTRODE REM PT RTRN 9FT ADLT (ELECTROSURGICAL) ×6 IMPLANT
FELT TEFLON 1X6 (MISCELLANEOUS) ×3 IMPLANT
GAUZE SPONGE 4X4 12PLY STRL (GAUZE/BANDAGES/DRESSINGS) ×6 IMPLANT
GLOVE BIO SURGEON STRL SZ 6 (GLOVE) IMPLANT
GLOVE BIO SURGEON STRL SZ 6.5 (GLOVE) IMPLANT
GLOVE BIOGEL PI IND STRL 6.5 (GLOVE) IMPLANT
GLOVE BIOGEL PI IND STRL 7.0 (GLOVE) IMPLANT
GLOVE ECLIPSE 7.0 STRL STRAW (GLOVE) ×6 IMPLANT
GLOVE ORTHO TXT STRL SZ7.5 (GLOVE) IMPLANT
GOWN STRL REUS W/ TWL LRG LVL3 (GOWN DISPOSABLE) ×12 IMPLANT
GOWN STRL REUS W/ TWL XL LVL3 (GOWN DISPOSABLE) ×3 IMPLANT
HEMOSTAT POWDER SURGIFOAM 1G (HEMOSTASIS) ×9 IMPLANT
HEMOSTAT SURGICEL 2X14 (HEMOSTASIS) ×3 IMPLANT
KIT BASIN OR (CUSTOM PROCEDURE TRAY) ×3 IMPLANT
KIT SUCTION CATH 14FR (SUCTIONS) ×3 IMPLANT
KIT TURNOVER KIT B (KITS) ×3 IMPLANT
KIT VASOVIEW HEMOPRO 2 VH 4000 (KITS) ×3 IMPLANT
KNIFE MICRO-UNI 3.5 30 DEG (BLADE) ×3 IMPLANT
NS IRRIG 1000ML POUR BTL (IV SOLUTION) ×15 IMPLANT
PACK E OPEN HEART (SUTURE) ×3 IMPLANT
PACK OPEN HEART (CUSTOM PROCEDURE TRAY) ×3 IMPLANT
PAD ARMBOARD POSITIONER FOAM (MISCELLANEOUS) ×6 IMPLANT
PAD ELECT DEFIB RADIOL ZOLL (MISCELLANEOUS) ×3 IMPLANT
PENCIL BUTTON HOLSTER BLD 10FT (ELECTRODE) ×3 IMPLANT
POSITIONER HEAD DONUT 9IN (MISCELLANEOUS) ×3 IMPLANT
PUNCH AORTIC ROTATE 4.5MM 8IN (MISCELLANEOUS) ×3 IMPLANT
SET MPS 3-ND DEL (MISCELLANEOUS) IMPLANT
SPONGE T-LAP 18X18 ~~LOC~~+RFID (SPONGE) IMPLANT
SPONGE T-LAP 4X18 ~~LOC~~+RFID (SPONGE) IMPLANT
STOPCOCK 4 WAY LG BORE MALE ST (IV SETS) IMPLANT
SUPPORT HEART JANKE-BARRON (MISCELLANEOUS) ×3 IMPLANT
SUT BONE WAX W31G (SUTURE) ×3 IMPLANT
SUT MNCRL AB 4-0 PS2 18 (SUTURE) IMPLANT
SUT PROLENE 3 0 SH1 36 (SUTURE) ×3 IMPLANT
SUT PROLENE 4 0 SH DA (SUTURE) IMPLANT
SUT PROLENE 4-0 RB1 .5 CRCL 36 (SUTURE) IMPLANT
SUT PROLENE 5 0 C 1 36 (SUTURE) IMPLANT
SUT PROLENE 6 0 C 1 30 (SUTURE) IMPLANT
SUT PROLENE 7 0 BV 1 (SUTURE) IMPLANT
SUT PROLENE 7 0 BV1 MDA (SUTURE) ×3 IMPLANT
SUT PROLENE 8 0 BV175 6 (SUTURE) IMPLANT
SUT SILK 1 MH (SUTURE) IMPLANT
SUT STEEL STERNAL CCS#1 18IN (SUTURE) IMPLANT
SUT STEEL SZ 6 DBL 3X14 BALL (SUTURE) IMPLANT
SUT VIC AB 1 CTX36XBRD ANBCTR (SUTURE) ×6 IMPLANT
SUT VIC AB 2-0 CT1 TAPERPNT 27 (SUTURE) IMPLANT
SUT VIC AB 3-0 X1 27 (SUTURE) IMPLANT
SYSTEM SAHARA CHEST DRAIN ATS (WOUND CARE) ×3 IMPLANT
TAPE CLOTH SURG 4X10 WHT LF (GAUZE/BANDAGES/DRESSINGS) IMPLANT
TAPE PAPER 2X10 WHT MICROPORE (GAUZE/BANDAGES/DRESSINGS) IMPLANT
TOWEL GREEN STERILE (TOWEL DISPOSABLE) ×3 IMPLANT
TOWEL GREEN STERILE FF (TOWEL DISPOSABLE) ×3 IMPLANT
TRAY FOLEY SLVR 14FR TEMP STAT (SET/KITS/TRAYS/PACK) IMPLANT
TRAY FOLEY SLVR 16FR TEMP STAT (SET/KITS/TRAYS/PACK) ×3 IMPLANT
TUBE SUCT INTRACARD DLP 20F (MISCELLANEOUS) ×3 IMPLANT
TUBE SUCTION CARDIAC 10FR (CANNULA) ×3 IMPLANT
TUBING LAP HI FLOW INSUFFLATIO (TUBING) ×3 IMPLANT
UNDERPAD 30X36 HEAVY ABSORB (UNDERPADS AND DIAPERS) ×3 IMPLANT
WATER STERILE IRR 1000ML POUR (IV SOLUTION) ×6 IMPLANT

## 2024-05-17 NOTE — Procedures (Signed)
 Extubation Procedure Note  Patient Details:   Name: TIJUANA SCHEIDEGGER DOB: 04/23/1951 MRN: [id number]   Airway Documentation:    Vent end date: 05/17/24 Vent end time: 1717   Evaluation  O2 sats: stable throughout Complications: No apparent complications Patient did tolerate procedure well. Bilateral Breath Sounds: Clear, Diminished   Yes Pt was successfully extubated per MD with no apparent complications. No stridor noted at this time.   Germain JAYSON Mater 05/17/2024, 5:26 PM

## 2024-05-17 NOTE — TOC CM/SW Note (Signed)
.  Transition of Care [facility]) - Inpatient Brief Assessment   Patient Details  Name: Becky Lawson MRN: [id number] Date of Birth: Aug 13, 1951  [facility] CM/SW Contact:    Justina Delcia Czar, RN Phone Number: [phone] 05/17/2024, 3:48 PM   Clinical Narrative: S/p CABG 05/17/2024 Patient preoperatively arranged with [facility] for [facility]. Will need PT/OT evaluation and recommendations. Attempted call to son, Norleen and left HIPAA compliant message for return call.   Chart reviewed for discharge readiness, patient not medically stable for d/c. Inpatient CM/CSW will continue to monitor pt's advancement through interdisciplinary progression rounds. If new pt transition needs arise, MD please place a TOC consult.    Transition of Care Asessment: Insurance and Status: Insurance coverage has been reviewed Patient has primary care physician: Yes Home environment has been reviewed: home alone Prior level of function:: independent Prior/Current Home Services: No current home services Social Drivers of Health Review: SDOH reviewed no interventions necessary Readmission risk has been reviewed: Yes Transition of care needs: transition of care needs identified, TOC will continue to follow

## 2024-05-17 NOTE — Transfer of Care (Signed)
 Immediate Anesthesia Transfer of Care Note  Patient: Becky Lawson  Procedure(s) Performed: CORONARY ARTERY BYPASS GRAFTING (CABG) X 2, USING BILATERAL INTERNAL MAMMARY ARTERIES (Chest) ECHOCARDIOGRAM, TRANSESOPHAGEAL, INTRAOPERATIVE  Patient Location: SICU  Anesthesia Type:General  Level of Consciousness: sedated and Patient remains intubated per anesthesia plan  Airway & Oxygen Therapy: Patient remains intubated per anesthesia plan and Patient placed on Ventilator (see vital sign flow sheet for setting)  Post-op Assessment: Report given to RN and Post -op Vital signs reviewed and stable  Post vital signs: Reviewed and stable  Last Vitals:  Vitals Value Taken Time  BP 126/53   Temp    Pulse 70   Resp    SpO2 100     Last Pain:  Vitals:   05/17/24 0614  TempSrc:   PainSc: 6       Patients Stated Pain Goal: 2 (05/17/24 9385)  Complications: No notable events documented.

## 2024-05-17 NOTE — Interval H&P Note (Signed)
 History and Physical Interval Note:  05/17/2024 6:38 AM  Becky Lawson  has presented today for surgery, with the diagnosis of CAD.  The various methods of treatment have been discussed with the patient and family. After consideration of risks, benefits and other options for treatment, the patient has consented to  Procedure(s): CORONARY ARTERY BYPASS GRAFTING (CABG) (N/A) ECHOCARDIOGRAM, TRANSESOPHAGEAL, INTRAOPERATIVE (N/A) as a surgical intervention.  The patient's history has been reviewed, patient examined, no change in status, stable for surgery.  I have reviewed the patient's chart and labs.  Questions were answered to the patient's satisfaction.     [name]

## 2024-05-17 NOTE — Op Note (Signed)
 CARDIOVASCULAR SURGERY OPERATIVE NOTE  05/17/2024  Surgeon:  Dorise LOIS Fellers, MD  First Assistant: Lemond Cera,  PA-C:   An experienced assistant was required given the complexity of this surgery and the standard of surgical care. The assistant was needed for endoscopic vein harvest exposure, dissection, suctioning, retraction of delicate tissues and sutures, instrument exchange and for overall help during this procedure.   Preoperative Diagnosis:  Severe multi-vessel coronary artery disease   Postoperative Diagnosis:  Same   Procedure:  Median Sternotomy Extracorporeal circulation 3.   Coronary artery bypass grafting x 2  Left internal mammary artery graft to the LAD Right internal mammary artery graft to the RCA      Anesthesia:  General Endotracheal   Clinical History/Surgical Indication:  This 73 year old woman has severe three-vessel coronary disease with recurrent anginal symptoms related to exertional or emotional stress. She had successful PCI of the left circumflex but still has high-grade stenosis in the LAD and RCA. I agree that coronary artery bypass graft surgery is the best treatment for resolution of her symptoms and to prevent myocardial loss. She is currently on aspirin  and Plavix  which she will need to continue for at least 6 months. I discussed the case with Dr. Ladona and he will coordinate Lovenox  bridging while she stops Plavix  5 days preoperatively at home. I discussed the operative procedure with the patient including alternatives, benefits and risks; including but not limited to bleeding, blood transfusion, infection, stroke, myocardial infarction, graft failure, heart block requiring a permanent pacemaker, organ dysfunction, and death. [name] understands and agrees to proceed.   Preparation:  The patient was seen in the preoperative holding area and the  correct patient, correct operation were confirmed with the patient after reviewing the medical record and catheterization. The consent was signed by me. Preoperative antibiotics were given. A pulmonary arterial line and radial arterial line were placed by the anesthesia team. The patient was taken back to the operating room and positioned supine on the operating room table. After being placed under general endotracheal anesthesia by the anesthesia team a foley catheter was placed. The neck, chest, abdomen, and both legs were prepped with betadine  soap and solution and draped in the usual sterile manner. A surgical time-out was taken and the correct patient and operative procedure were confirmed with the nursing and anesthesia staff.   Cardiopulmonary Bypass:  A median sternotomy was performed. The pericardium was opened in the midline. Right ventricular function appeared normal. The ascending aorta was of normal size and had no palpable plaque. There were no contraindications to aortic cannulation or cross-clamping. The patient was fully systemically heparinized and the ACT was maintained > 400 sec. The proximal aortic arch was cannulated with a 20 F aortic cannula for arterial inflow. Venous cannulation was performed via the right atrial appendage using a two-staged venous cannula. An antegrade cardioplegia/vent cannula was inserted into the mid-ascending aorta. Aortic occlusion was performed with a single cross-clamp. Systemic cooling to 32 degrees Centigrade and topical cooling of the heart with iced saline were used. Hyperkalemic antegrade cold blood cardioplegia was used to induce diastolic arrest and was then given at about 20 minute intervals throughout the period of arrest to maintain myocardial temperature at or below 10 degrees centigrade. A temperature probe was inserted into the interventricular septum and an insulating pad was placed in the pericardium.   Left internal mammary artery  harvest:  The left side of the sternum was retracted using the Rultract retractor. The left  internal mammary artery was harvested as a pedicle graft. All side branches were clipped. It was a medium-sized vessel of good quality with excellent blood flow. It was ligated distally and divided. It was sprayed with topical papaverine solution to prevent vasospasm.   Endoscopic vein harvest: Performed by Lemond Cera, PA-C  The right greater saphenous vein was evaluated through a 2 cm incision medial to the right knee. It was small and sclerotic appearing. Therefore the left greater saphenous vein was evaluated endoscopically medial to the left knee. It was also small and sclerotic appearing the the thigh. Then the GSV was evaluated at the right ankle and was again small and sclerotic. The left GSV at the ankle was the same. Therefore none of the saphenous veins were removed.  Right internal mammary artery harvest:  The right side of the sternum was retracted using the Rultract retractor. The right internal mammary artery was harvested as a pedicle graft. All side branches were clipped. It was a medium-sized vessel of good quality with excellent blood flow. It was ligated distally and divided. It was sprayed with topical papaverine solution to prevent vasospasm.   Coronary arteries:  The coronary arteries were examined.  LAD:  medium caliber vessel with no distal disease LCX:  recent PCI RCA:  Small but graftable in the mid portion   Grafts:  LIMA to the LAD: 1.75 mm. It was sewn end to side using 8-0 prolene continuous suture. RIMA to the RCA:  1.6 mm. It was sewn end to side using 8-0 prolene continuous suture.    Completion:  The patient was rewarmed to 37 degrees Centigrade. The clamp was removed from the LIMA pedicle and there was rapid warming of the septum and return of ventricular fibrillation. The crossclamp was removed with a time of 40 minutes. There was spontaneous return of sinus  rhythm. The distal and proximal anastomoses were checked for hemostasis. The position of the grafts was satisfactory. Two temporary epicardial pacing wires were placed on the right atrium and two on the right ventricle. The patient was weaned from CPB without difficulty on no inotropes. CPB time was 53 minutes. Cardiac output was 5 LPM. TEE showed normal LV systolic function. Heparin  was fully reversed with protamine  and the aortic and venous cannulas removed. Hemostasis was achieved. Mediastinal and bilateral  pleural drainage tubes were placed. The sternum was closed with  #6 stainless steel wires. The fascia was closed with continuous # 1 vicryl suture. The subcutaneous tissue was closed with 2-0 vicryl continuous suture. The skin was closed with 3-0 vicryl subcuticular suture. All sponge, needle, and instrument counts were reported correct at the end of the case. Dry sterile dressings were placed over the incisions and around the chest tubes which were connected to pleurevac suction. The patient was then transported to the surgical intensive care unit in stable condition.

## 2024-05-17 NOTE — Brief Op Note (Signed)
 05/17/2024  1:06 PM  PATIENT:  Becky Lawson  73 y.o. female  PRE-OPERATIVE DIAGNOSIS:  CAD Coronary Artery Disease  POST-OPERATIVE DIAGNOSIS:  CAD Coronary Artery Disease  PROCEDURE:  Procedure(s): CORONARY ARTERY BYPASS GRAFTING (CABG) X 2, USING BILATERAL INTERNAL MAMMARY ARTERIES (N/A) ECHOCARDIOGRAM, TRANSESOPHAGEAL, INTRAOPERATIVE (N/A)   SURGEON:  Surgeons and Role:    * Lucas Dorise POUR, MD - Primary  PHYSICIAN ASSISTANT: Lemond Cera, PA-C  ASSISTANTS: none   ANESTHESIA:   general  EBL:  720 mL   BLOOD ADMINISTERED:none  DRAINS: 2 Chest Tube(s) in the mediastinum and bilateral pleural tubes   LOCAL MEDICATIONS USED:  NONE  SPECIMEN:  No Specimen  DISPOSITION OF SPECIMEN:  N/A  COUNTS:  YES  TOURNIQUET:  * No tourniquets in log *  DICTATION: .Note written in EPIC  PLAN OF CARE: Admit to inpatient   PATIENT DISPOSITION:  ICU - intubated and hemodynamically stable.   Delay start of Pharmacological VTE agent (>24hrs) due to surgical blood loss or risk of bleeding: yes

## 2024-05-17 NOTE — Progress Notes (Signed)
  Echocardiogram Echocardiogram Transesophageal has been performed.  Koleen KANDICE Popper, RDCS 05/17/2024, 7:56 AM

## 2024-05-17 NOTE — Progress Notes (Signed)
   [address] [place] [address]             [phone]    S/p CABG x 2, BIMA  Working on extubation now  BP 105/67   Pulse 80   Temp 97.9 F (36.6 C)   Resp 20   Ht 5' 4.5 (1.638 m)   Wt 86.2 kg   SpO2 100%   BMI 32.11 kg/m  27/16 CI 2.66   Intake/Output Summary (Last 24 hours) at 05/17/2024 1718 Last data filed at 05/17/2024 1700 Gross per 24 hour  Intake 4683.36 ml  Output 1945 ml  Net 2738.36 ml   Minimal CT output K 4.7 Hgb 8.2  Doing well early postop  Elspeth C. Kerrin, MD [facility] [phone]

## 2024-05-17 NOTE — Anesthesia Preprocedure Evaluation (Signed)
 Anesthesia Evaluation  Patient identified by MRN, date of birth, ID band Patient awake    Reviewed: Allergy & Precautions, NPO status , Patient's Chart, lab work & pertinent test results, reviewed documented beta blocker date and time   History of Anesthesia Complications Negative for: history of anesthetic complications  Airway Mallampati: III  TM Distance: >3 FB     Dental no notable dental hx.    Pulmonary sleep apnea and Continuous Positive Airway Pressure Ventilation , neg COPD, former smoker   breath sounds clear to auscultation       Cardiovascular hypertension, + angina  + CAD  (-) Past MI and (-) Cardiac Stents + Valvular Problems/Murmurs  Rhythm:Regular Rate:Normal     Neuro/Psych neg Seizures PSYCHIATRIC DISORDERS Anxiety Depression Bipolar Disorder    Neuromuscular disease    GI/Hepatic ,GERD  ,,(+) Hepatitis -, C  Endo/Other  Hypothyroidism    Renal/GU Renal disease     Musculoskeletal  (+) Arthritis , Osteoarthritis,    Abdominal   Peds  Hematology   Anesthesia Other Findings   Reproductive/Obstetrics                              Anesthesia Physical Anesthesia Plan  ASA: 4  Anesthesia Plan: General   Post-op Pain Management:    Induction: Intravenous  PONV Risk Score and Plan: 2 and Ondansetron  and Dexamethasone   Airway Management Planned: Oral ETT  Additional Equipment: Arterial line, CVP, PA Cath, TEE and 3D TEE  Intra-op Plan:   Post-operative Plan: Possible Post-op intubation/ventilation  Informed Consent: I have reviewed the patients History and Physical, chart, labs and discussed the procedure including the risks, benefits and alternatives for the proposed anesthesia with the patient or authorized representative who has indicated his/her understanding and acceptance.     Dental advisory given  Plan Discussed with: CRNA  Anesthesia Plan Comments:           Anesthesia Quick Evaluation

## 2024-05-17 NOTE — Anesthesia Postprocedure Evaluation (Signed)
 Anesthesia Post Note  Patient: Becky Lawson  Procedure(s) Performed: CORONARY ARTERY BYPASS GRAFTING (CABG) X 2, USING BILATERAL INTERNAL MAMMARY ARTERIES (Chest) ECHOCARDIOGRAM, TRANSESOPHAGEAL, INTRAOPERATIVE     Patient location during evaluation: SICU Anesthesia Type: General Level of consciousness: sedated Pain management: pain level controlled Vital Signs Assessment: post-procedure vital signs reviewed and stable Respiratory status: patient remains intubated per anesthesia plan and patient on ventilator - see flowsheet for VS Cardiovascular status: stable Postop Assessment: no apparent nausea or vomiting Anesthetic complications: no   No notable events documented.  Last Vitals:  Vitals:   05/17/24 1600 05/17/24 1615  BP: (!) 81/58   Pulse: 80 80  Resp: 18 (!) 21  Temp: 36.6 C 36.5 C  SpO2: 98% 99%    Last Pain:  Vitals:   05/17/24 1600  TempSrc:   PainSc: 3                  Lynwood MARLA Cornea

## 2024-05-17 NOTE — Anesthesia Procedure Notes (Signed)
 Arterial Line Insertion Start/End9/08/2024 6:40 AM, 05/17/2024 6:45 AM Performed by: Keneth Lynwood POUR, MD, Zelphia Norleen HERO, CRNA, CRNA  Patient location: Pre-op. Preanesthetic checklist: patient identified, IV checked, site marked, risks and benefits discussed, surgical consent, monitors and equipment checked, pre-op evaluation, timeout performed and anesthesia consent Lidocaine  1% used for infiltration radial was placed Catheter size: 20 G Hand hygiene performed  and maximum sterile barriers used   Attempts: 1 Procedure performed without using ultrasound guided technique. Following insertion, dressing applied. Post procedure assessment: normal and unchanged

## 2024-05-17 NOTE — Anesthesia Procedure Notes (Addendum)
 Procedure Name: Intubation Date/Time: 05/17/2024 7:45 AM  Performed by: Elby Raelene SAUNDERS, CRNAPre-anesthesia Checklist: Patient identified, Emergency Drugs available, Suction available and Patient being monitored Patient Re-evaluated:Patient Re-evaluated prior to induction Oxygen Delivery Method: Circle System Utilized Preoxygenation: Pre-oxygenation with 100% oxygen Induction Type: IV induction Ventilation: Mask ventilation without difficulty Laryngoscope Size: Miller and 2 Grade View: Grade II Tube type: Oral Tube size: 8.0 mm Number of attempts: 1 Airway Equipment and Method: Stylet and Oral airway Placement Confirmation: ETT inserted through vocal cords under direct vision, positive ETCO2 and breath sounds checked- equal and bilateral Secured at: 23 cm Tube secured with: Tape Dental Injury: Teeth and Oropharynx as per pre-operative assessment

## 2024-05-18 ENCOUNTER — Inpatient Hospital Stay (HOSPITAL_COMMUNITY)

## 2024-05-18 LAB — CBC
HCT: 25.5 % — ABNORMAL LOW (ref 36.0–46.0)
HCT: 26.4 % — ABNORMAL LOW (ref 36.0–46.0)
Hemoglobin: 8.2 g/dL — ABNORMAL LOW (ref 12.0–15.0)
Hemoglobin: 8.5 g/dL — ABNORMAL LOW (ref 12.0–15.0)
MCH: 30.8 pg (ref 26.0–34.0)
MCH: 31 pg (ref 26.0–34.0)
MCHC: 32.2 g/dL (ref 30.0–36.0)
MCHC: 32.2 g/dL (ref 30.0–36.0)
MCV: 95.9 fL (ref 80.0–100.0)
MCV: 96.4 fL (ref 80.0–100.0)
Platelets: 155 K/uL (ref 150–400)
Platelets: 153 K/uL (ref 150–400)
RBC: 2.66 MIL/uL — ABNORMAL LOW (ref 3.87–5.11)
RBC: 2.74 MIL/uL — ABNORMAL LOW (ref 3.87–5.11)
RDW: 13.1 % (ref 11.5–15.5)
RDW: 13.2 % (ref 11.5–15.5)
WBC: 8.7 K/uL (ref 4.0–10.5)
WBC: 9.5 K/uL (ref 4.0–10.5)
nRBC: 0 % (ref 0.0–0.2)
nRBC: 0 % (ref 0.0–0.2)

## 2024-05-18 LAB — BASIC METABOLIC PANEL WITH GFR
Anion gap: 6 (ref 5–15)
Anion gap: 8 (ref 5–15)
BUN: 9 mg/dL (ref 8–23)
BUN: 11 mg/dL (ref 8–23)
CO2: 22 mmol/L (ref 22–32)
CO2: 23 mmol/L (ref 22–32)
Calcium: 7.8 mg/dL — ABNORMAL LOW (ref 8.9–10.3)
Calcium: 8.1 mg/dL — ABNORMAL LOW (ref 8.9–10.3)
Chloride: 105 mmol/L (ref 98–111)
Chloride: 104 mmol/L (ref 98–111)
Creatinine, Ser: 0.79 mg/dL (ref 0.44–1.00)
Creatinine, Ser: 0.96 mg/dL (ref 0.44–1.00)
GFR, Estimated: >60 mL/min (ref >60)
GFR, Estimated: >60 mL/min (ref >60)
Glucose, Bld: 107 mg/dL — ABNORMAL HIGH (ref 70–99)
Glucose, Bld: 156 mg/dL — ABNORMAL HIGH (ref 70–99)
Potassium: 4.1 mmol/L (ref 3.5–5.1)
Potassium: 4.3 mmol/L (ref 3.5–5.1)
Sodium: 133 mmol/L — ABNORMAL LOW (ref 135–145)
Sodium: 135 mmol/L (ref 135–145)

## 2024-05-18 LAB — GLUCOSE, CAPILLARY
Glucose-Capillary: 115 mg/dL — ABNORMAL HIGH (ref 70–99)
Glucose-Capillary: 115 mg/dL — ABNORMAL HIGH (ref 70–99)
Glucose-Capillary: 116 mg/dL — ABNORMAL HIGH (ref 70–99)
Glucose-Capillary: 117 mg/dL — ABNORMAL HIGH (ref 70–99)
Glucose-Capillary: 124 mg/dL — ABNORMAL HIGH (ref 70–99)
Glucose-Capillary: 124 mg/dL — ABNORMAL HIGH (ref 70–99)
Glucose-Capillary: 139 mg/dL — ABNORMAL HIGH (ref 70–99)
Glucose-Capillary: 146 mg/dL — ABNORMAL HIGH (ref 70–99)

## 2024-05-18 LAB — MAGNESIUM
Magnesium: 2.6 mg/dL — ABNORMAL HIGH (ref 1.7–2.4)
Magnesium: 2.2 mg/dL (ref 1.7–2.4)

## 2024-05-18 MED ORDER — POTASSIUM CHLORIDE ER 10 MEQ PO TBCR
20.0000 meq | EXTENDED_RELEASE_TABLET | Freq: Every day | ORAL | Status: DC
  Administered 2024-05-18: 20 meq via ORAL
  Filled 2024-05-18 (×3): qty 2

## 2024-05-18 MED ORDER — QUETIAPINE FUMARATE 25 MG PO TABS
50.0000 mg | ORAL_TABLET | Freq: Every day | ORAL | Status: DC
  Administered 2024-05-18 – 2024-05-24 (×7): 50 mg via ORAL
  Filled 2024-05-18 (×3): qty 2
  Filled 2024-05-18 (×2): qty 1
  Filled 2024-05-18: qty 2
  Filled 2024-05-18: qty 1

## 2024-05-18 MED ORDER — AMIODARONE HCL IN DEXTROSE 360-4.14 MG/200ML-% IV SOLN
INTRAVENOUS | Status: AC
  Administered 2024-05-18: 150 mg via INTRAVENOUS
  Filled 2024-05-18: qty 200

## 2024-05-18 MED ORDER — ALBUMIN HUMAN 5 % IV SOLN
12.5000 g | Freq: Once | INTRAVENOUS | Status: AC
Start: 2024-05-18 — End: 2024-05-18
  Administered 2024-05-18: 12.5 g via INTRAVENOUS
  Filled 2024-05-18: qty 250

## 2024-05-18 MED ORDER — FUROSEMIDE 10 MG/ML IJ SOLN
20.0000 mg | Freq: Every day | INTRAMUSCULAR | Status: DC
  Administered 2024-05-18: 20 mg via INTRAVENOUS
  Filled 2024-05-18: qty 2

## 2024-05-18 MED ORDER — AMIODARONE LOAD VIA INFUSION: 150.0000 mg | Freq: Once | INTRAVENOUS | Status: AC

## 2024-05-18 MED ORDER — ENOXAPARIN SODIUM 40 MG/0.4ML IJ SOSY
40.0000 mg | PREFILLED_SYRINGE | Freq: Every day | INTRAMUSCULAR | Status: DC
  Administered 2024-05-18 – 2024-05-22 (×5): 40 mg via SUBCUTANEOUS
  Filled 2024-05-18 (×5): qty 0.4

## 2024-05-18 MED ORDER — INSULIN ASPART 100 UNIT/ML IJ SOLN
0.0000 [IU] | INTRAMUSCULAR | Status: DC
  Administered 2024-05-18 – 2024-05-19 (×3): 2 [IU] via SUBCUTANEOUS

## 2024-05-18 MED ORDER — ALPRAZOLAM 0.5 MG PO TABS
1.0000 mg | ORAL_TABLET | Freq: Every day | ORAL | Status: DC
  Administered 2024-05-18 – 2024-05-24 (×7): 1 mg via ORAL
  Filled 2024-05-18 (×7): qty 2

## 2024-05-18 MED ORDER — FENTANYL CITRATE PF 50 MCG/ML IJ SOSY
25.0000 ug | PREFILLED_SYRINGE | Freq: Once | INTRAMUSCULAR | Status: AC
  Administered 2024-05-18: 25 ug via INTRAVENOUS
  Filled 2024-05-18: qty 1

## 2024-05-18 MED ORDER — LOSARTAN POTASSIUM 25 MG PO TABS
50.0000 mg | ORAL_TABLET | Freq: Every day | ORAL | Status: DC
Start: 2024-05-18 — End: 2024-05-20
  Administered 2024-05-18: 50 mg via ORAL
  Filled 2024-05-18: qty 2

## 2024-05-18 MED ORDER — AMIODARONE HCL IN DEXTROSE 360-4.14 MG/200ML-% IV SOLN
60.0000 mg/h | INTRAVENOUS | Status: AC
  Administered 2024-05-18 (×2): 60 mg/h via INTRAVENOUS
  Filled 2024-05-18: qty 200

## 2024-05-18 MED ORDER — AMIODARONE HCL IN DEXTROSE 360-4.14 MG/200ML-% IV SOLN
30.0000 mg/h | INTRAVENOUS | Status: AC
  Administered 2024-05-19: 30 mg/h via INTRAVENOUS
  Filled 2024-05-18: qty 200

## 2024-05-18 NOTE — Progress Notes (Signed)
   [address] [place] [address]             [phone]    POD # 1  Sitting up in chair  BP (!) 147/68   Pulse 94   Temp 98.5 F (36.9 C) (Oral)   Resp (!) 22   Ht 5' 4.5 (1.638 m)   Wt 91.8 kg   SpO2 94%   BMI 34.21 kg/m  2L [place] 93% sat   Intake/Output Summary (Last 24 hours) at 05/18/2024 1742 Last data filed at 05/18/2024 1600 Gross per 24 hour  Intake 1944.75 ml  Output 3045 ml  Net -1100.25 ml   K 4.3, creatinine 0.9 Hgb 8.5  Doing well BP elevated- resume Cozaar   Elspeth C. Kerrin, MD [facility] [phone]

## 2024-05-18 NOTE — Progress Notes (Signed)
 1 Day Post-Op Procedure(s) (LRB): CORONARY ARTERY BYPASS GRAFTING (CABG) X 2, USING BILATERAL INTERNAL MAMMARY ARTERIES (N/A) ECHOCARDIOGRAM, TRANSESOPHAGEAL, INTRAOPERATIVE (N/A) Subjective: C/o incisional pain, denies nausea  Objective: Vital signs in last 24 hours: Temp:  [94.8 F (34.9 C)-99.9 F (37.7 C)] 99.7 F (37.6 C) (09/13 0745) Pulse Rate:  [69-81] 80 (09/13 0745) Cardiac Rhythm: Atrial paced (09/12 2000) Resp:  [15-25] 21 (09/13 0745) BP: (79-109)/(45-69) 109/56 (09/13 0745) SpO2:  [92 %-100 %] 99 % (09/13 0745) Arterial Line BP: (100-206)/(1-62) 134/49 (09/13 0745) FiO2 (%):  [40 %-50 %] 40 % (09/12 1615) Weight:  [91.8 kg] 91.8 kg (09/13 0500)  Hemodynamic parameters for last 24 hours: PAP: (11-36)/(2-23) 23/11 CO:  [3.3 L/min-5.5 L/min] 5.1 L/min CI:  [1.7 L/min/m2-2.9 L/min/m2] 2.66 L/min/m2  Intake/Output from previous day: 09/12 0701 - 09/13 0700 In: 6064.5 [I.V.:3712.1; Blood:235; IV Piggyback:2117.4] Out: 3095 [Urine:1775; Blood:720; Chest Tube:600] Intake/Output this shift: No intake/output data recorded.  General appearance: alert, cooperative, and no distress Neurologic: intact Heart: regular rate and rhythm Lungs: diminished breath sounds bibasilar Abdomen: normal findings: soft, non-tender  Lab Results: Recent Labs    05/17/24 1816 05/17/24 1818 05/18/24 0437  WBC 9.0  --  8.7  HGB 9.3* 9.2* 8.2*  HCT 28.4* 27.0* 25.5*  PLT 147*  --  155   BMET:  Recent Labs    05/17/24 1816 05/17/24 1818 05/18/24 0437  NA 137 139 133*  K 4.6 4.6 4.1  CL 107  --  105  CO2 22  --  22  GLUCOSE 121*  --  107*  BUN 10  --  9  CREATININE 0.82  --  0.79  CALCIUM  7.9*  --  7.8*    PT/INR:  Recent Labs    05/17/24 1314  LABPROT 17.4*  INR 1.3*   ABG    Component Value Date/Time   PHART 7.306 (L) 05/17/2024 1818   HCO3 23.3 05/17/2024 1818   TCO2 25 05/17/2024 1818   ACIDBASEDEF 3.0 (H) 05/17/2024 1818   O2SAT 97 05/17/2024 1818   CBG  (last 3)  Recent Labs    05/18/24 0225 05/18/24 0431 05/18/24 0722  GLUCAP 116* 115* 117*    Assessment/Plan: S/P Procedure(s) (LRB): CORONARY ARTERY BYPASS GRAFTING (CABG) X 2, USING BILATERAL INTERNAL MAMMARY ARTERIES (N/A) ECHOCARDIOGRAM, TRANSESOPHAGEAL, INTRAOPERATIVE (N/A) POD # 1 NEURO- intact CV- in Sr with good output- Dc Swan  BP requiring low dose neo drip  ASA, restart Plavix  after EPW out  Statin, beta blocker RESP- IS for atelectasis RENAL- creatinine normal, lytes Ok  Diurese ENDO- CBG well controlled  SSI GI- diet as tolerated Anemia secondary to ABL, expected- monitor SCD + enoxaparin  Dc chest tubes Cardiac rehab   LOS: 1 day    Becky Lawson Millers 05/18/2024

## 2024-05-19 ENCOUNTER — Inpatient Hospital Stay (HOSPITAL_COMMUNITY)

## 2024-05-19 LAB — GLUCOSE, CAPILLARY
Glucose-Capillary: 132 mg/dL — ABNORMAL HIGH (ref 70–99)
Glucose-Capillary: 93 mg/dL (ref 70–99)
Glucose-Capillary: 135 mg/dL — ABNORMAL HIGH (ref 70–99)
Glucose-Capillary: 118 mg/dL — ABNORMAL HIGH (ref 70–99)
Glucose-Capillary: 148 mg/dL — ABNORMAL HIGH (ref 70–99)

## 2024-05-19 LAB — CBC
HCT: 27.2 % — ABNORMAL LOW (ref 36.0–46.0)
Hemoglobin: 8.6 g/dL — ABNORMAL LOW (ref 12.0–15.0)
MCH: 30.7 pg (ref 26.0–34.0)
MCHC: 31.6 g/dL (ref 30.0–36.0)
MCV: 97.1 fL (ref 80.0–100.0)
Platelets: 164 K/uL (ref 150–400)
RBC: 2.8 MIL/uL — ABNORMAL LOW (ref 3.87–5.11)
RDW: 13.3 % (ref 11.5–15.5)
WBC: 9.4 K/uL (ref 4.0–10.5)
nRBC: 0 % (ref 0.0–0.2)

## 2024-05-19 LAB — BASIC METABOLIC PANEL WITH GFR
Anion gap: 6 (ref 5–15)
BUN: 9 mg/dL (ref 8–23)
CO2: 24 mmol/L (ref 22–32)
Calcium: 8.1 mg/dL — ABNORMAL LOW (ref 8.9–10.3)
Chloride: 103 mmol/L (ref 98–111)
Creatinine, Ser: 0.75 mg/dL (ref 0.44–1.00)
GFR, Estimated: >60 mL/min (ref >60)
Glucose, Bld: 96 mg/dL (ref 70–99)
Potassium: 3.8 mmol/L (ref 3.5–5.1)
Sodium: 133 mmol/L — ABNORMAL LOW (ref 135–145)

## 2024-05-19 MED ORDER — POTASSIUM CHLORIDE CRYS ER 10 MEQ PO TBCR
20.0000 meq | EXTENDED_RELEASE_TABLET | Freq: Three times a day (TID) | ORAL | Status: AC
  Administered 2024-05-19 (×3): 20 meq via ORAL
  Filled 2024-05-19 (×4): qty 2

## 2024-05-19 MED ORDER — ASPIRIN 81 MG PO CHEW: 81.0000 mg | CHEWABLE_TABLET | Freq: Every day | ORAL | Status: DC

## 2024-05-19 MED ORDER — INSULIN ASPART 100 UNIT/ML IJ SOLN
0.0000 [IU] | Freq: Every day | INTRAMUSCULAR | Status: DC
  Administered 2024-05-19: 2 [IU] via SUBCUTANEOUS

## 2024-05-19 MED ORDER — AMIODARONE HCL 200 MG PO TABS
400.0000 mg | ORAL_TABLET | Freq: Two times a day (BID) | ORAL | Status: DC
Start: 2024-05-19 — End: 2024-05-20
  Administered 2024-05-19 (×2): 400 mg via ORAL
  Filled 2024-05-19 (×2): qty 2

## 2024-05-19 MED ORDER — CLOPIDOGREL BISULFATE 75 MG PO TABS
75.0000 mg | ORAL_TABLET | Freq: Every day | ORAL | Status: DC
  Administered 2024-05-19 – 2024-05-25 (×7): 75 mg via ORAL
  Filled 2024-05-19 (×7): qty 1

## 2024-05-19 MED ORDER — INSULIN ASPART 100 UNIT/ML IJ SOLN
0.0000 [IU] | Freq: Three times a day (TID) | INTRAMUSCULAR | Status: DC
  Administered 2024-05-19 (×2): 2 [IU] via SUBCUTANEOUS

## 2024-05-19 MED ORDER — ASPIRIN 81 MG PO TBEC
81.0000 mg | DELAYED_RELEASE_TABLET | Freq: Every day | ORAL | Status: DC
  Administered 2024-05-19 – 2024-05-21 (×3): 81 mg via ORAL
  Filled 2024-05-19 (×3): qty 1

## 2024-05-19 NOTE — Progress Notes (Signed)
 2 Days Post-Op Procedure(s) (LRB): CORONARY ARTERY BYPASS GRAFTING (CABG) X 2, USING BILATERAL INTERNAL MAMMARY ARTERIES (N/A) ECHOCARDIOGRAM, TRANSESOPHAGEAL, INTRAOPERATIVE (N/A) Subjective: Pain well controlled, symptomatic atrial fib last night Low Bp required neo and pacing  Objective: Vital signs in last 24 hours: Temp:  [98.5 F (36.9 C)-99.7 F (37.6 C)] 98.9 F (37.2 C) (09/14 0747) Pulse Rate:  [63-145] 72 (09/14 0747) Cardiac Rhythm: Atrial fibrillation (09/13 2100) Resp:  [15-29] 15 (09/14 0747) BP: (84-148)/(42-101) 84/53 (09/14 0747) SpO2:  [90 %-100 %] 96 % (09/14 0747) Arterial Line BP: (113-161)/(34-59) 125/38 (09/13 1430) Weight:  [91.3 kg] 91.3 kg (09/14 0500)  Hemodynamic parameters for last 24 hours: PAP: (19-36)/(5-22) 24/8  Intake/Output from previous day: 09/13 0701 - 09/14 0700 In: 1421.5 [I.V.:1045.5; IV Piggyback:376] Out: 3180 [Urine:3120; Chest Tube:60] Intake/Output this shift: No intake/output data recorded.  General appearance: alert, cooperative, and no distress Neurologic: intact Heart: regular rate and rhythm Lungs: diminished breath sounds bibasilar Abdomen: normal findings: soft, non-tender  Lab Results: Recent Labs    05/18/24 1507 05/19/24 0530  WBC 9.5 9.4  HGB 8.5* 8.6*  HCT 26.4* 27.2*  PLT 153 164   BMET:  Recent Labs    05/18/24 1507 05/19/24 0530  NA 135 133*  K 4.3 3.8  CL 104 103  CO2 23 24  GLUCOSE 156* 96  BUN 11 9  CREATININE 0.96 0.75  CALCIUM  8.1* 8.1*    PT/INR:  Recent Labs    05/17/24 1314  LABPROT 17.4*  INR 1.3*   ABG    Component Value Date/Time   PHART 7.306 (L) 05/17/2024 1818   HCO3 23.3 05/17/2024 1818   TCO2 25 05/17/2024 1818   ACIDBASEDEF 3.0 (H) 05/17/2024 1818   O2SAT 97 05/17/2024 1818   CBG (last 3)  Recent Labs    05/18/24 2341 05/19/24 0306 05/19/24 0748  GLUCAP 124* 93 135*    Assessment/Plan: S/P Procedure(s) (LRB): CORONARY ARTERY BYPASS GRAFTING (CABG) X  2, USING BILATERAL INTERNAL MAMMARY ARTERIES (N/A) ECHOCARDIOGRAM, TRANSESOPHAGEAL, INTRAOPERATIVE (N/A) POD # 2 NEURO- intact Cv- A fib with RVR last night, back in Sr on amiodarone   BP low requiring neo drip- now off neo   Will keep pacing wires  Transition to PO amiodarone   ASA, add Plavix  for recent stent RESP- continue IS RENAL- creatinine normal, diuresed well yesterday  Will hold off on additional diuresis for now with low BP  Supplement K ENDO- CBG well controlled  Change SSI to [facility] and HS GI- tolerating diet SCD + enoxaparin  Cardiac rehab   LOS: 2 days    Becky Lawson 05/19/2024

## 2024-05-19 NOTE — Progress Notes (Signed)
   [address], [place] [address]             [phone]   POD # 2  Up in chair Feels well  BP (!) 101/51   Pulse (!) 122   Temp 98.7 F (37.1 C) (Oral)   Resp (!) 24   Ht 5' 4.5 (1.638 m)   Wt 91.3 kg   SpO2 90%   BMI 34.00 kg/m   Now 88 in SR   Intake/Output Summary (Last 24 hours) at 05/19/2024 1749 Last data filed at 05/19/2024 1600 Gross per 24 hour  Intake 966.98 ml  Output 1745 ml  Net -778.02 ml   Doing well POD # 2  Elspeth C. Kerrin, MD [facility] [phone]

## 2024-05-19 NOTE — Hospital Course (Addendum)
 PCP is Levora Reyes SAUNDERS, MD Referring Provider is Verlin Lonni BIRCH*     History of Present Illness:.   The patient is a 73 year old woman with a history of prediabetes, hypertension, hyperlipidemia, hypothyroidism, prior smoking, and arthritis for which she takes oxycodone  and is followed in a pain clinic who presented with intermittent chest discomfort.  A coronary CTA in February 2025 showed a calcium  score of 3084 which was 99th percentile.  CT FFR analysis did not show any significant focal stenosis although there was a gradual decrease in the FFR across the left circumflex system as the vessel became smaller in caliber.  An echocardiogram in March 2025 showed normal left ventricular ejection fraction.  She continued to have episodes of right jaw and neck pain radiating down into her chest that were nitrate responsive.  A PET stress scan was done on 03/27/2024 evidence of ischemia.  She continued to have episodes of jaw and neck discomfort radiating into her chest as well as some pain down her right arm and underwent cardiac catheterization on 04/03/2024 showing severe three-vessel disease.  She had PCI with DES to the left circumflex that was complicated by dissection requiring stenting into the first marginal branch.  She also had significant disease in the LAD and RCA systems and was discharged on aspirin  and Plavix  with plans for a surgical evaluation after she was discussed at the multidisciplinary heart team meeting on 04/05/2024.   She reports continued episodes of right jaw and neck pain radiating into her chest and has taken several nitroglycerin  per day.  This usually resolves quickly after single nitroglycerin .  These episodes have occurred with emotional stress as well as exertion.  She has had some exertional shortness of breath.  She denies lower extremity edema and orthopnea.   She lives alone with her cats and has an elderly cat that she is concerned about being at the end of his  life and that is causing her a lot of stress.  This 73 year old woman has severe three-vessel coronary disease with recurrent anginal symptoms related to exertional or emotional stress.  She had successful PCI of the left circumflex but still has high-grade stenosis in the LAD and RCA.  I agree that coronary artery bypass graft surgery is the best treatment for resolution of her symptoms and to prevent myocardial loss.  She is currently on aspirin  and Plavix  which she will need to continue for at least 6 months.  I discussed the case with Dr. Ladona and he will coordinate Lovenox  bridging while she stops Plavix  5 days preoperatively at home. I discussed the operative procedure with the patient including alternatives, benefits and risks; including but not limited to bleeding, blood transfusion, infection, stroke, myocardial infarction, graft failure, heart block requiring a permanent pacemaker, organ dysfunction, and death.  [name] understands and agrees to proceed.    Hospital Course: Ms. Midkiff was admitted for elective surgery on 05/17/2024.  She was taken to the OR where coronary bypass grafting x 2 was carried out without complication.  Following procedure, she separated from cardiopulmonary bypass without difficulty requiring no inotropic support.  She was transferred to the surgical ICU in stable condition.  Vital signs and hemodynamics were satisfactory.  She was weaned from the ventilator and extubated around 515 on the day of surgery.  On the first postoperative day, the monitoring lines and chest tubes were removed.  She was mobilized routinely.  During the evening of postop day 1, she developed atrial fibrillation  with RVR.  She converted back to sinus rhythm on an amiodarone  infusion.  The amiodarone  was transitioned to p.o. form on postop day 2.  Patient wires were removed and she was started back on clopidogrel  for her recent stent.  Patient converted back into rapid atrial fibrillation.  She was  treated with 2 Amiodarone  boluses, with improvement of heart rate.  She was treated with IV lasix  to help facilitate diuresis.  PT and OT evaluations were obtained, they recommended ***.

## 2024-05-19 NOTE — Plan of Care (Signed)

## 2024-05-20 ENCOUNTER — Encounter (HOSPITAL_COMMUNITY): Payer: Self-pay | Admitting: Surgery

## 2024-05-20 ENCOUNTER — Inpatient Hospital Stay (HOSPITAL_COMMUNITY)

## 2024-05-20 DIAGNOSIS — Z951 Presence of aortocoronary bypass graft: Secondary | ICD-10-CM

## 2024-05-20 LAB — GLUCOSE, CAPILLARY
Glucose-Capillary: 144 mg/dL — ABNORMAL HIGH (ref 70–99)
Glucose-Capillary: 140 mg/dL — ABNORMAL HIGH (ref 70–99)
Glucose-Capillary: 140 mg/dL — ABNORMAL HIGH (ref 70–99)
Glucose-Capillary: 146 mg/dL — ABNORMAL HIGH (ref 70–99)

## 2024-05-20 LAB — BASIC METABOLIC PANEL WITH GFR
Anion gap: 8 (ref 5–15)
BUN: 10 mg/dL (ref 8–23)
CO2: 23 mmol/L (ref 22–32)
Calcium: 7.9 mg/dL — ABNORMAL LOW (ref 8.9–10.3)
Chloride: 102 mmol/L (ref 98–111)
Creatinine, Ser: 0.82 mg/dL (ref 0.44–1.00)
GFR, Estimated: >60 mL/min (ref >60)
Glucose, Bld: 103 mg/dL — ABNORMAL HIGH (ref 70–99)
Potassium: 4.7 mmol/L (ref 3.5–5.1)
Sodium: 133 mmol/L — ABNORMAL LOW (ref 135–145)

## 2024-05-20 LAB — CBC
HCT: 25 % — ABNORMAL LOW (ref 36.0–46.0)
Hemoglobin: 8 g/dL — ABNORMAL LOW (ref 12.0–15.0)
MCH: 31.3 pg (ref 26.0–34.0)
MCHC: 32 g/dL (ref 30.0–36.0)
MCV: 97.7 fL (ref 80.0–100.0)
Platelets: 145 K/uL — ABNORMAL LOW (ref 150–400)
RBC: 2.56 MIL/uL — ABNORMAL LOW (ref 3.87–5.11)
RDW: 13.3 % (ref 11.5–15.5)
WBC: 5.8 K/uL (ref 4.0–10.5)
nRBC: 0 % (ref 0.0–0.2)

## 2024-05-20 MED ORDER — FUROSEMIDE 10 MG/ML IJ SOLN
40.0000 mg | Freq: Two times a day (BID) | INTRAMUSCULAR | Status: AC
  Administered 2024-05-20 (×2): 40 mg via INTRAVENOUS
  Filled 2024-05-20 (×2): qty 4

## 2024-05-20 MED ORDER — POTASSIUM CHLORIDE CRYS ER 20 MEQ PO TBCR
20.0000 meq | EXTENDED_RELEASE_TABLET | Freq: Once | ORAL | Status: AC
Start: 2024-05-20 — End: 2024-05-20
  Administered 2024-05-20: 20 meq via ORAL
  Filled 2024-05-20: qty 1

## 2024-05-20 MED ORDER — AMIODARONE IV BOLUS ONLY 150 MG/100ML
150.0000 mg | Freq: Once | INTRAVENOUS | Status: AC
  Administered 2024-05-20: 150 mg via INTRAVENOUS

## 2024-05-20 MED ORDER — LACTULOSE 10 GM/15ML PO SOLN
20.0000 g | Freq: Once | ORAL | Status: AC
  Administered 2024-05-20: 20 g via ORAL
  Filled 2024-05-20: qty 30

## 2024-05-20 MED ORDER — AMIODARONE LOAD VIA INFUSION
150.0000 mg | Freq: Once | INTRAVENOUS | Status: AC
  Administered 2024-05-20: 150 mg via INTRAVENOUS
  Filled 2024-05-20: qty 83.34

## 2024-05-20 MED ORDER — AMIODARONE LOAD VIA INFUSION: 150.0000 mg | Freq: Once | INTRAVENOUS | Status: DC

## 2024-05-20 MED ORDER — METOPROLOL TARTRATE 25 MG PO TABS
25.0000 mg | ORAL_TABLET | Freq: Two times a day (BID) | ORAL | Status: DC
  Administered 2024-05-20 – 2024-05-21 (×3): 25 mg via ORAL
  Filled 2024-05-20 (×3): qty 1

## 2024-05-20 MED ORDER — AMIODARONE HCL IN DEXTROSE 360-4.14 MG/200ML-% IV SOLN
30.0000 mg/h | INTRAVENOUS | Status: DC
  Administered 2024-05-21: 30 mg/h via INTRAVENOUS
  Filled 2024-05-20: qty 200

## 2024-05-20 MED ORDER — METOPROLOL TARTRATE 25 MG/10 ML ORAL SUSPENSION
25.0000 mg | Freq: Two times a day (BID) | ORAL | Status: DC
  Filled 2024-05-20: qty 10

## 2024-05-20 MED ORDER — AMIODARONE IV BOLUS ONLY 150 MG/100ML
150.0000 mg | Freq: Once | INTRAVENOUS | Status: DC
Start: 2024-05-20 — End: 2024-05-20

## 2024-05-20 MED ORDER — AMIODARONE HCL IN DEXTROSE 360-4.14 MG/200ML-% IV SOLN
60.0000 mg/h | INTRAVENOUS | Status: AC
  Administered 2024-05-20 (×3): 60 mg/h via INTRAVENOUS
  Filled 2024-05-20 (×2): qty 200

## 2024-05-20 NOTE — Plan of Care (Signed)

## 2024-05-20 NOTE — Discharge Instructions (Signed)
 Discharge Instructions:  1. You may shower, please wash incisions daily with soap and water and keep dry.  If you wish to cover wounds with dressing you may do so but please keep clean and change daily.  No tub baths or swimming until incisions have completely healed.  If your incisions become red or develop any drainage please call our office at [phone]  2. No Driving until cleared by Dr. Jeralyn office and you are no longer using narcotic pain medications  3. Monitor your weight daily.. Please use the same scale and weigh at same time... If you gain 5-10 lbs in 48 hours with associated lower extremity swelling, please contact our office at [phone]  4. Fever of 101.5 for at least 24 hours with no source, please contact our office at [phone]  5. Activity- up as tolerated, please walk at least 3 times per day.  Avoid strenuous activity, no lifting, pushing, or pulling with your arms over 8-10 lbs for a minimum of 6 weeks  6. If any questions or concerns arise, please do not hesitate to contact our office at [phone]

## 2024-05-20 NOTE — Discharge Summary (Signed)
 [address] [place] [address]             [phone]        Physician Discharge Summary  Patient ID: Becky Lawson MRN: [id number] DOB/AGE: 73-15-1952 73 y.o.  Admit date: 05/17/2024 Discharge date: 05/25/2024  Admission Diagnoses:  Patient Active Problem List   Diagnosis Date Noted   Abnormal LFTs 05/15/2024   Post PTCA 04/03/2024   Elevated coronary artery calcium  score 12/25/2023   Diastolic dysfunction 12/25/2023   Closed fracture of fifth metatarsal bone 04/21/2023   Lumbar radiculopathy 11/24/2022   Malignant tumor of thyroid  gland (HCC) 10/26/2022   Vaginal lesion 09/30/2022   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/05/2022   Status post total replacement of right hip 07/05/2022   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   S/P total left hip arthroplasty 08/03/2021   Abnormal liver function tests 04/19/2021   Degeneration of lumbar intervertebral disc 05/25/2020   Tremor 07/12/2018   Prediabetes 07/12/2018   Polypharmacy 07/12/2018   Pain of right hip joint 01/02/2018   Fatty liver 04/18/2017   GERD (gastroesophageal reflux disease) 10/28/2016   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   Chronic throat clearing 06/24/2016   S/P partial thyroidectomy 06/16/2016   Thyroid  nodule 02/08/2016   Hx of hepatitis C 05/19/2015   Vitamin D  deficiency 05/19/2015   Spinal stenosis of lumbar region 05/19/2015   Hyperlipidemia 05/19/2015   Bipolar disorder (HCC) 05/19/2015   Essential hypertension 09/26/2013   Undiagnosed cardiac murmurs 09/26/2013   Discharge Diagnoses:  Patient Active Problem List   Diagnosis Date Noted   S/P CABG x 2 05/17/2024   Abnormal LFTs 05/15/2024   Post PTCA 04/03/2024   Elevated coronary artery calcium  score 12/25/2023   Diastolic dysfunction 12/25/2023   Closed fracture of fifth metatarsal bone 04/21/2023   Lumbar radiculopathy 11/24/2022   Malignant tumor of thyroid  gland (HCC) 10/26/2022   Vaginal  lesion 09/30/2022   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/05/2022   Status post total replacement of right hip 07/05/2022   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   S/P total left hip arthroplasty 08/03/2021   Abnormal liver function tests 04/19/2021   Degeneration of lumbar intervertebral disc 05/25/2020   Tremor 07/12/2018   Prediabetes 07/12/2018   Polypharmacy 07/12/2018   Pain of right hip joint 01/02/2018   Fatty liver 04/18/2017   GERD (gastroesophageal reflux disease) 10/28/2016   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   Chronic throat clearing 06/24/2016   S/P partial thyroidectomy 06/16/2016   Thyroid  nodule 02/08/2016   Hx of hepatitis C 05/19/2015   Vitamin D  deficiency 05/19/2015   Spinal stenosis of lumbar region 05/19/2015   Hyperlipidemia 05/19/2015   Bipolar disorder (HCC) 05/19/2015   Essential hypertension 09/26/2013   Undiagnosed cardiac murmurs 09/26/2013   Discharged Condition: stable  History of Present Illness:. The patient is a 73 year old woman with a history of prediabetes, hypertension, hyperlipidemia, hypothyroidism, prior smoking, and arthritis for which she takes oxycodone  and is followed in a pain clinic who presented with intermittent chest discomfort.  A coronary CTA in February 2025 showed a calcium  score of 3084 which was 99th percentile.  CT FFR analysis did not show any significant focal stenosis although there was a gradual decrease in the FFR across the left circumflex system as the vessel became smaller in caliber.  An echocardiogram in March 2025 showed normal left ventricular ejection fraction.  She continued to have episodes  of right jaw and neck pain radiating down into her chest that were nitrate responsive.  A PET stress scan was done on 03/27/2024 evidence of ischemia.  She continued to have episodes of jaw and neck discomfort radiating into her chest as well as some pain down her right arm and underwent cardiac catheterization on  04/03/2024 showing severe three-vessel disease.  She had PCI with DES to the left circumflex that was complicated by dissection requiring stenting into the first marginal branch.  She also had significant disease in the LAD and RCA systems and was discharged on aspirin  and Plavix  with plans for a surgical evaluation after she was discussed at the multidisciplinary heart team meeting on 04/05/2024.   She reports continued episodes of right jaw and neck pain radiating into her chest and has taken several nitroglycerin  per day.  This usually resolves quickly after single nitroglycerin .  These episodes have occurred with emotional stress as well as exertion.  She has had some exertional shortness of breath.  She denies lower extremity edema and orthopnea.   She lives alone with her cats and has an elderly cat that she is concerned about being at the end of his life and that is causing her a lot of stress.  This 73 year old woman has severe three-vessel coronary disease with recurrent anginal symptoms related to exertional or emotional stress.  She had successful PCI of the left circumflex but still has high-grade stenosis in the LAD and RCA.  I agree that coronary artery bypass graft surgery is the best treatment for resolution of her symptoms and to prevent myocardial loss.  She is currently on aspirin  and Plavix  which she will need to continue for at least 6 months.  I discussed the case with Dr. Ladona and he will coordinate Lovenox  bridging while she stops Plavix  5 days preoperatively at home. I discussed the operative procedure with the patient including alternatives, benefits and risks; including but not limited to bleeding, blood transfusion, infection, stroke, myocardial infarction, graft failure, heart block requiring a permanent pacemaker, organ dysfunction, and death.  Becky Lawson understands and agrees to proceed.    Hospital Course:  [name] was admitted for elective surgery on 05/17/2024.  She was  taken to the OR where coronary bypass grafting x 2 was carried out without complication.  Following procedure, she separated from cardiopulmonary bypass without difficulty requiring no inotropic support.  She was transferred to the surgical ICU in stable condition.  Vital signs and hemodynamics were satisfactory.  She was weaned from the ventilator and extubated around 515 on the day of surgery.  On the first postoperative day, the monitoring lines and chest tubes were removed.  She was mobilized routinely.  During the evening of postop day 1, she developed atrial fibrillation with RVR.  She converted back to sinus rhythm on an amiodarone  infusion.  The amiodarone  was transitioned to p.o. form on postop day 2.  Patient wires were removed and she was started back on clopidogrel  for her recent stent.  Patient converted back into rapid atrial fibrillation.  She was treated with 2 Amiodarone  boluses, with improvement of heart rate.  She was treated with IV lasix  to help facilitate diuresis. She lives alone and would require SNF placement at discharge. PT and OT evaluations were obtained, they recommended SNF as well. She converted to NSR, IV Amiodarone  was transitioned to PO Amiodarone . Plavix  was restarted for recent LCX stent. She was felt stable for transfer to the progressive unit on  POD4. Her epicardial pacing wires were removed without complication on POD5. She was hypertensive and she would have bursts of sinus tachycardia, Lopressor  was titrated and this resolved. She began moving her bowels appropriately. She did have a black tarry stool, hemoccult was ordered and was negative. She was ambulating well on room air. Her incisions were healing well without sign of infection. She was felt stable for discharge to SNF.   Consults: None  Significant Diagnostic Studies: angiography:   Cardiac Catheterization 04/03/24: Hemodynamic data: LVEDP 35 mm Hg, Markedly elevated, no pressure gradient across the aortic  valve.   Angiographic data: LM: Large-caliber vessel, mildly calcified. LAD: It is a moderate large caliber vessel, proximal LAD has diffuse 30 to 40% calcific stenosis followed by a very focal 95% calcific stenosis at the origin of D2 and is tortuous at this segment.  Distal-apical LAD has a 70% focal stenosis. LCx: Codominant with RCA.  Moderately calcified.  There is a very focal 95% calcific stenosis in the midsegment just proximal to the origin of a very large OM1.  OM1 has a ostial 30 to 40% calcific stenosis. RCA: Codominant with CX, continues as a moderate-sized PL branch with very small PDA, has a focal 90% stenosis at origin of RV branch in the midsegment.   Intervention data: Successful but difficult procedure due to calcification and distal dissection.  PTCA and stenting of the mid CX and OM1 with 2 overlapping 3.5 x 12 in the CX and 3.0 x 12 mm Synergy XD DES overlapping into the OM1, stenosis reduced from 95% to 0% with TIMI-3 to TIMI-3 flow.  Impression and recommendations: Patient has severely complex three-vessel coronary disease and probably would be better suited for CABG.  I attempted to pass the guidewire into the LAD after the CX PCI, and I was unable to pass a 2.5 mm balloon to predilate the lesion.  There was also spasm mid segment Cx and also a small type a dissection was evident postprocedure in the LAD.  I suspect if she needs PCI to the LAD, it is can be a complex long-term intervention with atherectomy.  RCA lesion is very focal.   I have recommended that we observe her overnight for any potential post PCI complication, discharge in the morning, elective staged intervention to RCA and will take a relook at LAD at that time and consider atherectomy followed by stenting of a focal high-grade mid LAD stenosis avoiding stenting the long proximal segment.    DG Chest Port 1 View Result Date: 05/20/2024 CLINICAL DATA:  Atelectasis. EXAM: PORTABLE CHEST 1 VIEW COMPARISON:   05/19/2024 FINDINGS: The cardio pericardial silhouette is enlarged. Left base atelectasis with small effusion is similar to prior. Streaky density at the right base suggest atelectasis. No overt edema. No pneumothorax. Right IJ sheath again noted. Telemetry leads overlie the chest. IMPRESSION: No substantial interval change. Left base atelectasis with small effusion. Electronically Signed   By: Camellia Candle M.D.   On: 05/20/2024 07:23   DG Chest Port 1 View Result Date: 05/19/2024 CLINICAL DATA:  73 year old female postoperative day 2 status post CABG. EXAM: PORTABLE CHEST 1 VIEW COMPARISON:  Portable chest yesterday and earlier. FINDINGS: Portable AP semi upright view at 0537 hours. Swan-Ganz catheter removed. Right IJ introducer sheath remains. Mediastinal and chest tubes removed. Stable low lung volumes. Stable cardiac size and mediastinal contours. No pneumothorax or pulmonary edema. Visualized tracheal air column is within normal limits. Stable lung base atelectasis. Possible small pleural effusions. Negative  visible bowel gas.  Stable visualized osseous structures. IMPRESSION: 1. Lines and tubes aside from right IJ introducer sheath removed. No pneumothorax. 2. Stable low lung volumes, ventilation.  No pulmonary edema. Electronically Signed   By: VEAR Hurst M.D.   On: 05/19/2024 09:18   DG Chest Port 1 View Result Date: 05/18/2024 CLINICAL DATA:  73 year old female status post CABG postoperative day 1. EXAM: PORTABLE CHEST 1 VIEW COMPARISON:  Portable chest yesterday and earlier. FINDINGS: Portable AP semi upright view at 0527 hours. Extubated. Stable right IJ approach Swan-Ganz catheter, tip at the main pulmonary outflow tract. Stable bilateral chest and mediastinal tubes. Stable lung volumes. Stable cardiac size and mediastinal contours. No pneumothorax identified. Perihilar and lung base atelectasis. No pulmonary edema. No definite pleural fluid. Paucity of bowel gas in the upper abdomen. Stable  visualized osseous structures. IMPRESSION: 1. Extubated. Otherwise stable lines and tubes. 2. Stable lung volumes.  No pneumothorax or pulmonary edema. Electronically Signed   By: VEAR Hurst M.D.   On: 05/18/2024 08:28   ECHO INTRAOPERATIVE TEE Result Date: 05/17/2024  *INTRAOPERATIVE TRANSESOPHAGEAL REPORT *  Patient Name:   Becky Lawson Date of Exam: 05/17/2024 Medical Rec #:  [id number]    Height:       64.5 in Accession #:    [id number]   Weight:       190.0 lb Date of Birth:  1951-02-08    BSA:          1.92 m Patient Age:    73 years     BP:           158/80 mmHg Patient Gender: F            HR:           66 bpm. Exam Location:  Inpatient Transesophogeal exam was perform intraoperatively during surgical procedure. Patient was closely monitored under general anesthesia during the entirety of examination. Indications:     CAD, multiple vessel I25.10 Sonographer:     Koleen Popper RDCS Performing Phys: [id number] DORISE POUR BARTLE Diagnosing Phys: Lynwood Cornea Complications: No known complications during this procedure. POST-OP IMPRESSIONS _ Left Ventricle: The left ventricle is unchanged from pre-bypass. Normal systolic function. _ Right Ventricle: The right ventricle appears unchanged from pre-bypass; normal function. _ Aorta: The aorta appears unchanged from pre-bypass. There is no dissection present in the aorta. _ Left Atrial Appendage: The left atrial appendage appears unchanged from pre-bypass. _ Aortic Valve: The aortic valve appears unchanged from pre-bypass. Trace regurgitation. _ Mitral Valve: The mitral valve appears unchanged from pre-bypass. There is mild regurgitation. _ Tricuspid Valve: The tricuspid valve appears unchanged from pre-bypass. There is mild regurgitation. _ Pulmonic Valve: The pulmonic valve appears unchanged from pre-bypass. _ Interatrial Septum: The interatrial septum appears unchanged from pre-bypass. _ Pericardium: The pericardium appears unchanged from pre-bypass. _ Comments: No significant  pleural or pericardial effusion. PRE-OP FINDINGS  Left Ventricle: The left ventricle has normal systolic function, with an ejection fraction of 55-60%. The cavity size was normal. There is mild concentric left ventricular hypertrophy. Right Ventricle: The right ventricle has normal systolic function. The cavity was normal. There is no increase in right ventricular wall thickness. Catheter present in the right ventricle. Left Atrium: Left atrial size was not assessed. No left atrial/left atrial appendage thrombus was detected. Right Atrium: Right atrial size was not assessed. Interatrial Septum: No atrial level shunt detected by color flow Doppler. Pericardium: There is no evidence of pericardial effusion. There is  no pleural effusion. Mitral Valve: The mitral valve is normal in structure. Mitral valve regurgitation is mild by color flow Doppler. There is no evidence of mitral stenosis. Tricuspid Valve: The tricuspid valve was normal in structure. Tricuspid valve regurgitation is mild by color flow Doppler. No evidence of tricuspid stenosis is present. Aortic Valve: The aortic valve is tricuspid. Aortic valve regurgitation is trivial by color flow Doppler. There is no stenosis of the aortic valve, with a calculated valve area of 2.42 cm. There is moderate calcification present on the aortic valve left coronary cusp with mildly decreased mobility. Pulmonic Valve: The pulmonic valve was normal in structure No evidence of pumonic stenosis. Pulmonic valve regurgitation is trivial by color flow Doppler. Aorta: The aortic root, ascending aorta and aortic arch are normal in size and structure. There is evidence of plaque in the descending aorta; Grade IV, measuring >67mm in size. +--------------+--------++ LEFT VENTRICLE          +----------------+---------++ +--------------+--------++  Diastology                PLAX 2D                 +----------------+---------++ +--------------+--------++  LV e' lateral:   7.41 cm/s LVOT diam:    2.30 cm   +----------------+---------++ +--------------+--------++  LV E/e' lateral:9.5       LVOT Area:    4.15 cm  +----------------+---------++ +--------------+--------++                        +--------------+--------++ +---------------+------+-------+ RIGHT VENTRICLE              +---------------+------+-------+ TAPSE (M-mode):1.7 cm2.37 cm +---------------+------+-------+ +------------------+------------++ AORTIC VALVE                   +------------------+------------++ AV Area (Vmax):   2.63 cm     +------------------+------------++ AV Area (Vmean):  2.41 cm     +------------------+------------++ AV Area (VTI):    2.42 cm     +------------------+------------++ AV Vmax:          157.00 cm/s  +------------------+------------++ AV Vmean:         111.000 cm/s +------------------+------------++ AV VTI:           0.347 m      +------------------+------------++ AV Peak Grad:     9.9 mmHg     +------------------+------------++ AV Mean Grad:     6.0 mmHg     +------------------+------------++ LVOT Vmax:        99.50 cm/s   +------------------+------------++ LVOT Vmean:       64.300 cm/s  +------------------+------------++ LVOT VTI:         0.202 m      +------------------+------------++ LVOT/AV VTI ratio:0.58         +------------------+------------++ +--------------+----------++ MITRAL VALVE              +--------------+-------+ +--------------+----------++  SHUNTS                MV Area (PHT):3.12 cm    +--------------+-------+ +--------------+----------++  Systemic VTI: 0.20 m  MV PHT:       70.47 msec  +--------------+-------+ +--------------+----------++  Systemic Diam:2.30 cm MV Decel Time:243 msec    +--------------+-------+ +--------------+----------++ +--------------+----------++ MV E velocity:70.20 cm/s +--------------+----------++ MV A velocity:60.90 cm/s  +--------------+----------++ MV E/A ratio: 1.15       +--------------+----------++  Lynwood Cornea Electronically signed by Lynwood Cornea Signature Date/Time: 05/17/2024/3:32:28 PM    Final    DG Chest  Port 1 View Result Date: 05/17/2024 EXAM: 1 VIEW XRAY OF THE CHEST 05/17/2024 01:36:00 PM COMPARISON: 05/15/2024 CLINICAL HISTORY: Hx of CABG FINDINGS: LINES, TUBES AND DEVICES: ETT in place with tip 4 cm above carina. Right IJ Swan-Ganz catheter in place with tip overlying main pulmonary artery. Bilateral chest tubes and mediastinal drain in place. LUNGS AND PLEURA: Trace left pleural effusion. Possible trace right apical pneumothorax. Left basilar opacities. HEART AND MEDIASTINUM: Coronary stent noted. Median sternotomy noted. BONES AND SOFT TISSUES: No acute osseous abnormality. IMPRESSION: 1. Possible trace right apical pneumothorax. 2. Trace left pleural effusion. 3. Left basilar opacities. 4. Support devices as above. Electronically signed by: Donnice Mania MD 05/17/2024 02:06 PM EDT RP Workstation: [id number]   VAS US  DOPPLER PRE CABG Result Date: 05/15/2024 PREOPERATIVE VASCULAR EVALUATION Patient Name:  Becky Lawson  Date of Exam:   05/15/2024 Medical Rec #: [id number]     Accession #:    [id number] Date of Birth: 29-Aug-1951     Patient Gender: F Patient Age:   57 years Exam Location:  [facility] Procedure:      VAS US  DOPPLER PRE CABG Referring Phys: DORISE FELLERS --------------------------------------------------------------------------------  Indications:  Pre-CABG. Risk Factors: Hypertension, hyperlipidemia, Diabetes. Performing Technologist: Edilia Elden Appl  Examination Guidelines: A complete evaluation includes B-mode imaging, spectral Doppler, color Doppler, and power Doppler as needed of all accessible portions of each vessel. Bilateral testing is considered an integral part of a complete examination. Limited examinations for reoccurring indications may be performed as noted.  ABI Findings:  +------------------+-----+---------+ Rt Pressure (mmHg)IndexWaveform  +------------------+-----+---------+ 170                    triphasic +------------------+-----+---------+ 181               1.06 triphasic +------------------+-----+---------+ 173               1.02 triphasic +------------------+-----+---------+ 169               0.99 Normal    +------------------+-----+---------+ +------------------+-----+---------+ Lt Pressure (mmHg)IndexWaveform  +------------------+-----+---------+ 163                    triphasic +------------------+-----+---------+ 192               1.13 triphasic +------------------+-----+---------+ 179               1.05 triphasic +------------------+-----+---------+ 160               0.94 Normal    +------------------+-----+---------+ +-------+---------------+ ABI/TBIToday's ABI/TBI +-------+---------------+ Right  1.06/ 0.99      +-------+---------------+ Left   1.13/ 0.94      +-------+---------------+  Right Doppler Findings: +--------+--------+---------+ Site    PressureDoppler   +--------+--------+---------+ [id number]     triphasic +--------+--------+---------+ Radial          triphasic +--------+--------+---------+ Ulnar           biphasic  +--------+--------+---------+  Left Doppler Findings: +--------+--------+---------+ Site    PressureDoppler   +--------+--------+---------+ [id number]     triphasic +--------+--------+---------+ Radial          triphasic +--------+--------+---------+ Ulnar           triphasic +--------+--------+---------+   Right ABI: Resting right ankle-brachial index is within normal range. The right toe-brachial index is normal. Left ABI: Resting left ankle-brachial index is within normal range. The left toe-brachial index is normal. Right Upper Extremity: Doppler waveforms remain within normal limits with right radial compression. Doppler  waveform obliterate with right ulnar  compression. Left Upper Extremity: Doppler waveforms remain within normal limits with left radial compression. Doppler waveforms decrease >50% with left ulnar compression.  Electronically signed by Lonni Gaskins MD on 05/15/2024 at 12:09:31 PM.    Final    DG Chest 2 View Result Date: 05/15/2024 CLINICAL DATA:  Preop for coronary artery disease. EXAM: CHEST - 2 VIEW COMPARISON:  June 24, 2016. FINDINGS: The heart size and mediastinal contours are within normal limits. Both lungs are clear. The visualized skeletal structures are unremarkable. IMPRESSION: No active cardiopulmonary disease. Electronically Signed   By: Lynwood Landy Raddle M.D.   On: 05/15/2024 11:45     Results for orders placed or performed during the hospital encounter of 05/17/24 (from the past 48 hours)  Occult blood card to lab, stool RN will collect     Status: None   Collection Time: 05/23/24  7:59 PM  Result Value Ref Range   Fecal Occult Bld NEGATIVE NEGATIVE    Comment: Performed at [facility], [address], [place], [place] [address]  Basic metabolic panel with GFR     Status: Abnormal   Collection Time: 05/24/24  6:23 AM  Result Value Ref Range   Sodium 137 135 - 145 mmol/L   Potassium 4.2 3.5 - 5.1 mmol/L   Chloride 99 98 - 111 mmol/L   CO2 26 22 - 32 mmol/L   Glucose, Bld 118 (H) 70 - 99 mg/dL    Comment: Glucose reference range applies only to samples taken after fasting for at least 8 hours.   BUN 10 8 - 23 mg/dL   Creatinine, Ser 8.80 (H) 0.44 - 1.00 mg/dL   Calcium  8.6 (L) 8.9 - 10.3 mg/dL   GFR, Estimated 48 (L) >60 mL/min    Comment: (NOTE) Calculated using the CKD-EPI Creatinine Equation (2021)    Anion gap 12 5 - 15    Comment: Performed at [facility], [address], [place], [place] [address]  Glucose, capillary     Status: Abnormal   Collection Time: 05/24/24 12:13 PM  Result Value Ref Range   Glucose-Capillary 112 (H) 70 - 99 mg/dL    Comment: Glucose reference range applies  only to samples taken after fasting for at least 8 hours.   Comment 1 Notify RN    Comment 2 Document in Chart   Glucose, capillary     Status: Abnormal   Collection Time: 05/24/24  5:15 PM  Result Value Ref Range   Glucose-Capillary 105 (H) 70 - 99 mg/dL    Comment: Glucose reference range applies only to samples taken after fasting for at least 8 hours.   Comment 1 Notify RN    Comment 2 Document in Chart      Treatments: surgery:   CARDIOVASCULAR SURGERY OPERATIVE NOTE   05/17/2024   Surgeon:  Dorise LOIS Fellers, MD   First Assistant: Lemond Cera,  PA-C:   An experienced assistant was required given the complexity of this surgery and the standard of surgical care. The assistant was needed for endoscopic vein harvest exposure, dissection, suctioning, retraction of delicate tissues and sutures, instrument exchange and for overall help during this procedure.     Preoperative Diagnosis:  Severe multi-vessel coronary artery disease     Postoperative Diagnosis:  Same     Procedure:   Median Sternotomy Extracorporeal circulation 3.   Coronary artery bypass grafting x 2   Left internal mammary artery graft to  the LAD Right internal mammary artery graft to the RCA    Discharge Exam: Blood pressure 123/67, pulse 73, temperature 98.1 F (36.7 C), temperature source Oral, resp. rate 16, height 5' 4.5 (1.638 m), weight 86.3 kg, SpO2 94%.  General appearance: alert, cooperative, and no distress Heart: regular rate and rhythm Lungs: clear to auscultation bilaterally Abdomen: benign Extremities: no edema Wound: incis healing well   Discharge Medications:  The patient has been discharged on:   1.Beta Blocker:  Yes [ X  ]                              No   [   ]                              If No, reason:  2.Ace Inhibitor/ARB: Yes [   ]                                     No  [  X  ]                                     If No, reason: Titrated BB for further HR  control  3.Statin:   Yes [ X  ]                  No  [   ]                  If No, reason:  4.Ecasa:  Yes  [ X  ]                  No   [   ]                  If No, reason:  Patient had ACS upon admission: No  Plavix /P2Y12 inhibitor: Yes [ X ] on prior to admission                                       No  [   ]     Discharge Instructions     Amb Referral to Cardiac Rehabilitation   Complete by: As directed    Diagnosis: CABG   CABG X ___: 2   After initial evaluation and assessments completed: Virtual Based Care may be provided alone or in conjunction with Phase 2 Cardiac Rehab based on patient barriers.: Yes   Intensive Cardiac Rehabilitation (ICR) [facility] location only OR Traditional Cardiac Rehabilitation (TCR) *If criteria for ICR are not met will enroll in TCR ([facility] only): Yes      Allergies as of 05/25/2024   No Known Allergies      Medication List     STOP taking these medications    amLODipine  10 MG tablet Commonly known as: NORVASC    atenolol  25 MG tablet Commonly known as: TENORMIN    enoxaparin  80 MG/0.8ML injection Commonly known as: Lovenox    hydrOXYzine  25 MG tablet Commonly known as: ATARAX    isosorbide  mononitrate 30 MG 24 hr tablet Commonly known as: IMDUR    losartan  50 MG tablet Commonly known  as: COZAAR    nitroGLYCERIN  0.4 MG SL tablet Commonly known as: NITROSTAT        TAKE these medications    ALPRAZolam  1 MG tablet Commonly known as: XANAX  TAKE 1/2 TO 1 TABLET BY MOUTH twice daily AS NEEDED FOR ANXIETY What changed:  how much to take how to take this when to take this additional instructions   amiodarone  200 MG tablet Commonly known as: PACERONE  Take 2 tablets twice per day for 5 days, then take 1 tablet twice per day for 7 days, then take 1 tablet daily thereafter   aspirin  EC 81 MG tablet Take 1 tablet (81 mg total) by mouth daily. Swallow whole.   atorvastatin  80 MG tablet Commonly known as: LIPITOR  Take 1 tablet  (80 mg total) by mouth daily.   clopidogrel  75 MG tablet Commonly known as: PLAVIX  Take 1 tablet (75 mg total) by mouth daily with breakfast.   docusate sodium  100 MG capsule Commonly known as: COLACE Take 1 capsule (100 mg total) by mouth 2 (two) times daily. What changed: when to take this   ezetimibe  10 MG tablet Commonly known as: ZETIA  Take 1 tablet (10 mg total) by mouth daily.   Fe Fum-Vit C-Vit B12-FA Caps capsule Commonly known as: TRIGELS-F FORTE Take 1 capsule by mouth daily after breakfast.   furosemide  40 MG tablet Commonly known as: LASIX  Take 1 tablet (40 mg total) by mouth daily. Take for 2 days then stop   lamoTRIgine  100 MG tablet Commonly known as: LAMICTAL  TAKE TWO TABLETS BY MOUTH EVERY MORNING and TAKE ONE TABLET BY MOUTH EVERY EVENING   levothyroxine  100 MCG tablet Commonly known as: SYNTHROID  TAKE 1 TABLET BY MOUTH DAILY BEFORE BREAKFAST   metoprolol  tartrate 50 MG tablet Commonly known as: LOPRESSOR  Take 1 tablet (50 mg total) by mouth 2 (two) times daily.   oxyCODONE -acetaminophen  10-325 MG tablet Commonly known as: PERCOCET Take 1 tablet by mouth every 6 (six) hours as needed for pain.   pantoprazole  40 MG tablet Commonly known as: PROTONIX  Take 1 tablet (40 mg total) by mouth daily at 6 (six) AM. What changed: when to take this   polyethylene glycol 17 g packet Commonly known as: MIRALAX  / GLYCOLAX  Take 17 g by mouth daily as needed for mild constipation.   potassium chloride  SA 20 MEQ tablet Commonly known as: KLOR-CON  M Take 1 tablet (20 mEq total) by mouth daily. Take for 2 days then stop   QUEtiapine  50 MG tablet Commonly known as: SEROQUEL  Take 1 tablet (50 mg total) by mouth daily. What changed: when to take this   traZODone  150 MG tablet Commonly known as: DESYREL  TAKE TWO TABLETS BY MOUTH everyday AT bedtime   Vitamin D  (Ergocalciferol ) 1.25 MG (50000 UNIT) Caps capsule Commonly known as: DRISDOL  Take 1 capsule (50,000  Units total) by mouth every 7 (seven) days. Sunday What changed:  when to take this additional instructions        Follow-up Information     West, Katlyn D, NP Follow up on 06/10/2024.   Specialty: Cardiology Why: Cardiology appointment is at 10:05AM Contact information: [address] [place] [address] [phone]         [facility] Dept[facility] of [facility] Follow up on 05/28/2024.   Specialty: Cardiothoracic Surgery Why: Nurse visit for suture removal is at 11:30AM Contact information: [address] [place]  [address] [phone]  Rutha Manuelita HERO, PA-C Follow up on 06/12/2024.   Specialties: Physician Assistant, Thoracic Surgery Why: Follow up appointment is at 10:00AM, please arrive 1 hour prior to your appointment for chest xray on the 2nd floor of our building Contact information: [address] [place] [address] [phone]                 Signed:  Lemond FORBES Cera, PA-C  05/25/2024, 7:58 AM .

## 2024-05-20 NOTE — Progress Notes (Signed)
 Patient ID: Becky Lawson, female   DOB: 1951-02-07, 73 y.o.   MRN: [id number]  TCTS Evening Rounds  Hemodynamically stable in NSR 90 on IV amio and Lopressor  25 bid.  Diuresing well.  Ambulated.  Bowels working.

## 2024-05-20 NOTE — TOC Initial Note (Addendum)
 Transition of Care [facility]) - Initial/Assessment Note    Patient Details  Name: Becky Lawson MRN: [id number] Date of Birth: [birth year]  Transition [facility] CM/SW Contact:    Justina Delcia Czar, RN Phone Number: [phone]  05/20/2024, 2:27 PM  Clinical Narrative:                 Spoke to pt at bedside. States she lives alone and prefers to go to SNF rehab at [facility] or new facility in [place]. Requesting a private room. States she had private room when she had knee surgery. Pt reports having RW at home. Gave permission to create FL2 and fax referral out for SNF rehab.   Pt preoperatively arranged with [employer] for [facility]. Offered choice. Medicare.gov list placed on chart with ratings.  Referral sent to Inpatient CSW to follow for SNF. Will need PT/OT evaluation and recommendations.     Expected Discharge Plan: Skilled Nursing Facility Barriers to Discharge: Continued Medical Work up   Patient Goals and CMS Choice Patient states their goals for this hospitalization and ongoing recovery are:: wants to get stronger before returning home CMS Medicare.gov Compare Post Acute Care list provided to:: Patient Choice offered to / list presented to : Patient      Expected Discharge Plan and Services In-house Referral: Clinical Social Work Discharge Planning Services: CM Consult Post Acute Care Choice: Skilled Nursing Facility Living arrangements for the past 2 months: Single Family Home                                      Prior Living Arrangements/Services Living arrangements for the past 2 months: Single Family Home Lives with:: Self Patient language and need for interpreter reviewed:: Yes Do you feel safe going back to the place where you live?: Yes      Need for Family Participation in Patient Care: Yes (Comment) Care giver support system in place?: Yes (comment) Current home services: DME Criminal Activity/Legal Involvement Pertinent to Current  Situation/Hospitalization: No - Comment as needed  Activities of Daily Living   ADL Screening (condition at time of admission) Independently performs ADLs?: No Does the patient have a NEW difficulty with bathing/dressing/toileting/self-feeding that is expected to last >3 days?: Yes (Initiates electronic notice to provider for possible OT consult) Does the patient have a NEW difficulty with getting in/out of bed, walking, or climbing stairs that is expected to last >3 days?: Yes (Initiates electronic notice to provider for possible PT consult) Does the patient have a NEW difficulty with communication that is expected to last >3 days?: No Is the patient deaf or have difficulty hearing?: No Does the patient have difficulty seeing, even when wearing glasses/contacts?: No Does the patient have difficulty concentrating, remembering, or making decisions?: No  Permission Sought/Granted Permission sought to share information with : Case Manager, Family Supports, PCP       Permission granted to share info w AGENCY: SNF rehab, Home Health, DME, PCP        Emotional Assessment Appearance:: Appears stated age Attitude/Demeanor/Rapport: Engaged Affect (typically observed): Accepting Orientation: : Oriented to Self, Oriented to Place, Oriented to  Time, Oriented to Situation   Psych Involvement: No (comment)  Admission diagnosis:  Coronary artery disease involving native coronary artery of native heart without angina pectoris [I25.10] Hx of CABG [Z95.1] Patient Active Problem List   Diagnosis Date Noted   S/P CABG x  2 05/17/2024   Abnormal LFTs 05/15/2024   Post PTCA 04/03/2024   Elevated coronary artery calcium  score 12/25/2023   Diastolic dysfunction 12/25/2023   Closed fracture of fifth metatarsal bone 04/21/2023   Lumbar radiculopathy 11/24/2022   Malignant tumor of thyroid  gland (HCC) 10/26/2022   Vaginal lesion 09/30/2022   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/05/2022   Status post  total replacement of right hip 07/05/2022   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   S/P total left hip arthroplasty 08/03/2021   Abnormal liver function tests 04/19/2021   Degeneration of lumbar intervertebral disc 05/25/2020   Tremor 07/12/2018   Prediabetes 07/12/2018   Polypharmacy 07/12/2018   Pain of right hip joint 01/02/2018   Fatty liver 04/18/2017   GERD (gastroesophageal reflux disease) 10/28/2016   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   Chronic throat clearing 06/24/2016   S/P partial thyroidectomy 06/16/2016   Thyroid  nodule 02/08/2016   Hx of hepatitis C 05/19/2015   Vitamin D  deficiency 05/19/2015   Spinal stenosis of lumbar region 05/19/2015   Hyperlipidemia 05/19/2015   Bipolar disorder (HCC) 05/19/2015   Essential hypertension 09/26/2013   Undiagnosed cardiac murmurs 09/26/2013   PCP:  Levora Reyes SAUNDERS, MD Pharmacy:   [facility] - [place], [place] - [address] [place] [address] Phone: [phone] Fax: [phone]  [facility] [address] [place] [address] Phone: [phone] Fax: [phone]     Social Drivers of Health (SDOH) Social History: SDOH Screenings   Food Insecurity: No Food Insecurity (05/20/2024)  Housing: Low Risk  (05/20/2024)  Transportation Needs: No Transportation Needs (05/20/2024)  Utilities: Not At Risk (05/20/2024)  Alcohol Screen: Low Risk  (03/19/2024)  Depression (PHQ2-9): Medium Risk (03/19/2024)  Financial Resource Strain: Low Risk  (05/14/2024)  Physical Activity: Sufficiently Active (05/14/2024)  Recent Concern: Physical Activity - Insufficiently Active (03/19/2024)  Social Connections: Moderately Integrated (05/20/2024)  Stress: No Stress Concern Present (05/14/2024)  Recent Concern: Stress - Stress Concern Present (03/19/2024)  Tobacco Use: Medium Risk (05/17/2024)  Health Literacy: Adequate Health Literacy  (03/19/2024)   SDOH Interventions:     Readmission Risk Interventions     No data to display

## 2024-05-20 NOTE — Progress Notes (Addendum)
      [address] [place] [address]             [phone]         3 Days Post-Op Procedure(s) (LRB): CORONARY ARTERY BYPASS GRAFTING (CABG) X 2, USING BILATERAL INTERNAL MAMMARY ARTERIES (N/A) ECHOCARDIOGRAM, TRANSESOPHAGEAL, INTRAOPERATIVE (N/A)  Subjective:  Patient sitting on bedside commode.  States overall she is feeling better today.  She states the first 2 days were really rough.  Her pain is better controlled.  She has moved her bowels.  Objective: Vital signs in last 24 hours: Temp:  [97.5 F (36.4 C)-99 F (37.2 C)] 97.5 F (36.4 C) (09/15 0739) Pulse Rate:  [76-151] 123 (09/15 0947) Cardiac Rhythm: Atrial fibrillation (09/15 0800) Resp:  [14-28] 17 (09/15 0845) BP: (91-148)/(46-92) 131/92 (09/15 0947) SpO2:  [73 %-100 %] 90 % (09/15 0845) Weight:  [92.4 kg] 92.4 kg (09/15 0500)  Intake/Output from previous day: 09/14 0701 - 09/15 0700 In: 109.1 [I.V.:49.6; IV Piggyback:59.5] Out: 2200 [Urine:2200] Intake/Output this shift: Total I/O In: 500 [I.V.:500] Out: 125 [Urine:125]  General appearance: alert, cooperative, and no distress Heart: irregularly irregular rhythm Lungs: diminished breath sounds bibasilar Abdomen: soft, non-tender; bowel sounds normal; no masses,  no organomegaly Extremities: mild edema Wound: clean and dry incisions, mild ecchymosis bilateral thighs from [facility]  Lab Results: Recent Labs    05/19/24 0530 05/20/24 0546  WBC 9.4 5.8  HGB 8.6* 8.0*  HCT 27.2* 25.0*  PLT 164 145*   BMET:  Recent Labs    05/19/24 0530 05/20/24 0546  NA 133* 133*  K 3.8 4.7  CL 103 102  CO2 24 23  GLUCOSE 96 103*  BUN 9 10  CREATININE 0.75 0.82  CALCIUM  8.1* 7.9*    PT/INR:  Recent Labs    05/17/24 1314  LABPROT 17.4*  INR 1.3*   ABG    Component Value Date/Time   PHART 7.306 (L) 05/17/2024 1818   HCO3 23.3 05/17/2024 1818   TCO2 25 05/17/2024 1818   ACIDBASEDEF 3.0 (H) 05/17/2024 1818   O2SAT 97  05/17/2024 1818   CBG (last 3)  Recent Labs    05/19/24 1615 05/19/24 2139 05/20/24 0737  GLUCAP 148* 140* 140*    Assessment/Plan: S/P Procedure(s) (LRB): CORONARY ARTERY BYPASS GRAFTING (CABG) X 2, USING BILATERAL INTERNAL MAMMARY ARTERIES (N/A) ECHOCARDIOGRAM, TRANSESOPHAGEAL, INTRAOPERATIVE (N/A)  CV- PAF, elevated rates initially in the 140s today, she received IV Amiodarone  bolus x 2 with rate improvement into the 90s.. continue IV Amiodarone  for now, on Lopressor  25 mg BID.[name] if persists will require anticoagulation Pulm- on room air, CXR without pneumothorax, left sided atelectasis/effusion Renal- creatinine has been stable, weight is about 13 lbs above baseline.. IV lasix  ordered, potassium is at 4.7 Expected post operative blood loss anemia, Hgb at 8.0, no evidence of bleeding Partial Thyroidectomy, remaining nodule CBGs- patient is not a diabetic, sugars are stable continue SSIP Deconditioning- will get PT/OT consult Dispo- patient stable, in rate controlled A. Fib currently.. continue IV Amiodarone , leave in ICU today   LOS: 3 days    Rocky Shad, PA-C 05/20/2024 10:56 AM    Chart reviewed, patient examined, agree with above.  She feels well. Back in atrial fib with RVR this am and IV amio resumed with two boluses. Now back in sinus 70's. Will continue IV amio today. Continue diuresis.

## 2024-05-21 LAB — BASIC METABOLIC PANEL WITH GFR
Anion gap: 8 (ref 5–15)
BUN: 11 mg/dL (ref 8–23)
CO2: 26 mmol/L (ref 22–32)
Calcium: 8 mg/dL — ABNORMAL LOW (ref 8.9–10.3)
Chloride: 102 mmol/L (ref 98–111)
Creatinine, Ser: 0.93 mg/dL (ref 0.44–1.00)
GFR, Estimated: >60 mL/min (ref >60)
Glucose, Bld: 122 mg/dL — ABNORMAL HIGH (ref 70–99)
Potassium: 4.3 mmol/L (ref 3.5–5.1)
Sodium: 136 mmol/L (ref 135–145)

## 2024-05-21 LAB — CBC
HCT: 24.5 % — ABNORMAL LOW (ref 36.0–46.0)
Hemoglobin: 7.8 g/dL — ABNORMAL LOW (ref 12.0–15.0)
MCH: 30.7 pg (ref 26.0–34.0)
MCHC: 31.8 g/dL (ref 30.0–36.0)
MCV: 96.5 fL (ref 80.0–100.0)
Platelets: 175 K/uL (ref 150–400)
RBC: 2.54 MIL/uL — ABNORMAL LOW (ref 3.87–5.11)
RDW: 13.2 % (ref 11.5–15.5)
WBC: 5.5 K/uL (ref 4.0–10.5)
nRBC: 0.5 % — ABNORMAL HIGH (ref 0.0–0.2)

## 2024-05-21 LAB — GLUCOSE, CAPILLARY
Glucose-Capillary: 117 mg/dL — ABNORMAL HIGH (ref 70–99)
Glucose-Capillary: 113 mg/dL — ABNORMAL HIGH (ref 70–99)
Glucose-Capillary: 106 mg/dL — ABNORMAL HIGH (ref 70–99)

## 2024-05-21 MED ORDER — ASPIRIN 81 MG PO TBEC
81.0000 mg | DELAYED_RELEASE_TABLET | Freq: Every day | ORAL | Status: DC
  Administered 2024-05-22 – 2024-05-25 (×4): 81 mg via ORAL
  Filled 2024-05-21 (×4): qty 1

## 2024-05-21 MED ORDER — SODIUM CHLORIDE 0.9% FLUSH: 3.0000 mL | INTRAVENOUS | Status: DC | PRN

## 2024-05-21 MED ORDER — ~~LOC~~ CARDIAC SURGERY, PATIENT & FAMILY EDUCATION: Freq: Once | Status: AC

## 2024-05-21 MED ORDER — AMIODARONE HCL 200 MG PO TABS
400.0000 mg | ORAL_TABLET | Freq: Two times a day (BID) | ORAL | Status: DC
  Administered 2024-05-21 – 2024-05-25 (×9): 400 mg via ORAL
  Filled 2024-05-21 (×9): qty 2

## 2024-05-21 MED ORDER — FENTANYL CITRATE PF 50 MCG/ML IJ SOSY: 25.0000 ug | PREFILLED_SYRINGE | Freq: Once | INTRAMUSCULAR | Status: DC

## 2024-05-21 MED ORDER — FUROSEMIDE 40 MG PO TABS
40.0000 mg | ORAL_TABLET | Freq: Every day | ORAL | Status: DC
  Administered 2024-05-21 – 2024-05-25 (×5): 40 mg via ORAL
  Filled 2024-05-21 (×5): qty 1

## 2024-05-21 MED ORDER — METOPROLOL TARTRATE 25 MG PO TABS
25.0000 mg | ORAL_TABLET | Freq: Two times a day (BID) | ORAL | Status: DC
  Administered 2024-05-21: 25 mg via ORAL
  Filled 2024-05-21: qty 1

## 2024-05-21 MED ORDER — POTASSIUM CHLORIDE CRYS ER 20 MEQ PO TBCR
20.0000 meq | EXTENDED_RELEASE_TABLET | Freq: Every day | ORAL | Status: DC
  Administered 2024-05-21 – 2024-05-25 (×5): 20 meq via ORAL
  Filled 2024-05-21 (×5): qty 1

## 2024-05-21 MED ORDER — SODIUM CHLORIDE 0.9 % IV SOLN: 250.0000 mL | INTRAVENOUS | Status: DC | PRN

## 2024-05-21 MED ORDER — SODIUM CHLORIDE 0.9% FLUSH: 3.0000 mL | Freq: Two times a day (BID) | INTRAVENOUS | Status: DC

## 2024-05-21 MED ORDER — FE FUM-VIT C-VIT B12-FA 460-60-0.01-1 MG PO CAPS
1.0000 | ORAL_CAPSULE | Freq: Every day | ORAL | Status: DC
  Administered 2024-05-21 – 2024-05-25 (×5): 1 via ORAL
  Filled 2024-05-21 (×5): qty 1

## 2024-05-21 MED FILL — Lidocaine HCl Local Soln Prefilled Syringe 100 MG/5ML (2%): INTRAMUSCULAR | Qty: 5 | Status: AC

## 2024-05-21 MED FILL — Potassium Chloride Inj 2 mEq/ML: INTRAVENOUS | Qty: 40 | Status: AC

## 2024-05-21 MED FILL — Thrombin (Recombinant) For Soln 20000 Unit: CUTANEOUS | Qty: 1 | Status: AC

## 2024-05-21 MED FILL — Heparin Sodium (Porcine) Inj 1000 Unit/ML: INTRAMUSCULAR | Qty: 10 | Status: AC

## 2024-05-21 MED FILL — Electrolyte-R (PH 7.4) Solution: INTRAVENOUS | Qty: 3000 | Status: AC

## 2024-05-21 MED FILL — Sodium Chloride IV Soln 0.9%: INTRAVENOUS | Qty: 2000 | Status: AC

## 2024-05-21 MED FILL — Heparin Sodium (Porcine) Inj 1000 Unit/ML: Qty: 1000 | Status: AC

## 2024-05-21 MED FILL — Magnesium Sulfate Inj 50%: INTRAMUSCULAR | Qty: 10 | Status: AC

## 2024-05-21 MED FILL — Mannitol IV Soln 20%: INTRAVENOUS | Qty: 500 | Status: AC

## 2024-05-21 MED FILL — Sodium Bicarbonate IV Soln 8.4%: INTRAVENOUS | Qty: 50 | Status: AC

## 2024-05-21 NOTE — [place] FL2 (Signed)
   MEDICAID FL2 LEVEL OF CARE FORM     IDENTIFICATION  Patient Name: Becky Lawson Birthdate: July 15, 1951 Sex: female Admission Date (Current Location): 05/17/2024  [facility] and [place] Number:  Producer, television/film/video and Address:  [facility], [address], [place], [place] [address]      Provider Number: [id number]  Attending Physician Name and Address:  Lucas Dorise POUR, MD  Relative Name and Phone Number:  Wanda Boards (daughter) [phone]    Current Level of Care: Hospital Recommended Level of Care: Skilled Nursing Facility Prior Approval Number:    Date Approved/Denied:   PASRR Number: PASRR under review  Discharge Plan: SNF    Current Diagnoses: Patient Active Problem List   Diagnosis Date Noted   S/P CABG x 2 05/17/2024   Abnormal LFTs 05/15/2024   Post PTCA 04/03/2024   Elevated coronary artery calcium  score 12/25/2023   Diastolic dysfunction 12/25/2023   Closed fracture of fifth metatarsal bone 04/21/2023   Lumbar radiculopathy 11/24/2022   Malignant tumor of thyroid  gland (HCC) 10/26/2022   Vaginal lesion 09/30/2022   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/05/2022   Status post total replacement of right hip 07/05/2022   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   S/P total left hip arthroplasty 08/03/2021   Abnormal liver function tests 04/19/2021   Degeneration of lumbar intervertebral disc 05/25/2020   Tremor 07/12/2018   Prediabetes 07/12/2018   Polypharmacy 07/12/2018   Pain of right hip joint 01/02/2018   Fatty liver 04/18/2017   GERD (gastroesophageal reflux disease) 10/28/2016   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   Chronic throat clearing 06/24/2016   S/P partial thyroidectomy 06/16/2016   Thyroid  nodule 02/08/2016   Hx of hepatitis C 05/19/2015   Vitamin D  deficiency 05/19/2015   Spinal stenosis of lumbar region 05/19/2015   Hyperlipidemia 05/19/2015   Bipolar disorder (HCC) 05/19/2015    Essential hypertension 09/26/2013   Undiagnosed cardiac murmurs 09/26/2013    Orientation RESPIRATION BLADDER Height & Weight     Self, Time, Situation, Place  Normal Continent Weight: 203 lb 12.8 oz (92.4 kg) Height:  5' 4.5 (163.8 cm)  BEHAVIORAL SYMPTOMS/MOOD NEUROLOGICAL BOWEL NUTRITION STATUS      Continent Diet (Please see discharge summary)  AMBULATORY STATUS COMMUNICATION OF NEEDS Skin   Limited Assist Verbally Other (Comment) (Ecchymosis, Leg, Bil.,Erythema,Leg,Bil.,Wound/Incision LDAs,Wound sugical closed surgical incision chest,other,wound surgical closed incision leg,L,R)                       Personal Care Assistance Level of Assistance  Bathing, Dressing, Feeding Bathing Assistance: Limited assistance Feeding assistance: Independent Dressing Assistance: Limited assistance     Functional Limitations Info  Sight, Hearing, Speech Sight Info:  (wears glasses) Hearing Info: Adequate Speech Info: Adequate    SPECIAL CARE FACTORS FREQUENCY  PT (By licensed PT), OT (By licensed OT)     PT Frequency: 5x min weekly OT Frequency: 5x min weekly            Contractures Contractures Info: Not present    Additional Factors Info  Code Status, Allergies, Psychotropic Code Status Info: FULL Allergies Info: NKA Psychotropic Info: Please see dc summary         Current Medications (05/21/2024):  This is the current hospital active medication list Current Facility-Administered Medications  Medication Dose Route Frequency Provider Last Rate Last Admin   acetaminophen  (TYLENOL ) tablet 1,000 mg  1,000 mg Oral Q6H Bartle, Bryan K, MD  1,000 mg at 05/21/24 1253   Or   acetaminophen  (TYLENOL ) 160 MG/5ML solution 1,000 mg  1,000 mg Per Tube Q6H Lucas Dorise POUR, MD       acetaminophen  (TYLENOL ) 160 MG/5ML solution 650 mg  650 mg Per Tube Once Lucas Dorise POUR, MD       ALPRAZolam  (XANAX ) tablet 1 mg  1 mg Oral QHS Hendrickson, Steven C, MD   1 mg at 05/20/24 2206    amiodarone  (PACERONE ) tablet 400 mg  400 mg Oral BID Bartle, Bryan K, MD   400 mg at 05/21/24 0813   aspirin  EC tablet 81 mg  81 mg Oral Daily Kerrin Elspeth BROCKS, MD   81 mg at 05/21/24 1103   Or   aspirin  chewable tablet 81 mg  81 mg Per Tube Daily Kerrin Elspeth BROCKS, MD       atorvastatin  (LIPITOR ) tablet 80 mg  80 mg Oral Daily Bartle, Bryan K, MD   80 mg at 05/21/24 1103   Chlorhexidine  Gluconate Cloth 2 % PADS 6 each  6 each Topical Daily Bartle, Bryan K, MD   6 each at 05/21/24 1000   clopidogrel  (PLAVIX ) tablet 75 mg  75 mg Oral Q breakfast Kerrin Elspeth BROCKS, MD   75 mg at 05/21/24 0813   enoxaparin  (LOVENOX ) injection 40 mg  40 mg Subcutaneous QHS Kerrin Elspeth BROCKS, MD   40 mg at 05/20/24 2203   Fe Fum-Vit C-Vit B12-FA (TRIGELS-F FORTE) capsule 1 capsule  1 capsule Oral QPC breakfast Bartle, Bryan K, MD   1 capsule at 05/21/24 0813   fentaNYL  (SUBLIMAZE ) injection 25 mcg  25 mcg Intravenous Once Bartle, Bryan K, MD       furosemide  (LASIX ) tablet 40 mg  40 mg Oral Daily Bartle, Bryan K, MD   40 mg at 05/21/24 1253   lamoTRIgine  (LAMICTAL ) tablet 100 mg  100 mg Oral q1800 Bartle, Bryan K, MD   100 mg at 05/20/24 1719   lamoTRIgine  (LAMICTAL ) tablet 200 mg  200 mg Oral q AM Bartle, Bryan K, MD   200 mg at 05/21/24 0641   levothyroxine  (SYNTHROID ) tablet 100 mcg  100 mcg Oral QAC breakfast Bartle, Bryan K, MD   100 mcg at 05/21/24 9361   metoprolol  tartrate (LOPRESSOR ) tablet 25 mg  25 mg Oral BID Bartle, Bryan K, MD   25 mg at 05/21/24 1103   Or   metoprolol  tartrate (LOPRESSOR ) 25 mg/10 mL oral suspension 25 mg  25 mg Per Tube BID Bartle, Bryan K, MD       metoprolol  tartrate (LOPRESSOR ) injection 2.5-5 mg  2.5-5 mg Intravenous Q2H PRN Bartle, Bryan K, MD   2.5 mg at 05/18/24 1900   morphine  (PF) 2 MG/ML injection 1-4 mg  1-4 mg Intravenous Q1H PRN Bartle, Bryan K, MD   2 mg at 05/17/24 2147   ondansetron  (ZOFRAN ) injection 4 mg  4 mg Intravenous Q6H PRN Bartle, Bryan K, MD        oxyCODONE  (Oxy IR/ROXICODONE ) immediate release tablet 5-10 mg  5-10 mg Oral Q3H PRN Bartle, Bryan K, MD   5 mg at 05/20/24 0645   pantoprazole  (PROTONIX ) EC tablet 40 mg  40 mg Oral Daily Bartle, Bryan K, MD   40 mg at 05/21/24 1103   potassium chloride  SA (KLOR-CON  M) CR tablet 20 mEq  20 mEq Oral Daily Bartle, Bryan K, MD   20 mEq at 05/21/24 1253   QUEtiapine  (SEROQUEL ) tablet 50 mg  50 mg  Oral QHS Kerrin Elspeth BROCKS, MD   50 mg at 05/20/24 2206   sodium chloride  flush (NS) 0.9 % injection 10-40 mL  10-40 mL Intracatheter Q12H Lucas Dorise POUR, MD   10 mL at 05/21/24 1000   sodium chloride  flush (NS) 0.9 % injection 10-40 mL  10-40 mL Intracatheter PRN Lucas Dorise POUR, MD       sodium chloride  flush (NS) 0.9 % injection 3 mL  3 mL Intravenous Q12H Bartle, Bryan K, MD   3 mL at 05/21/24 1000   sodium chloride  flush (NS) 0.9 % injection 3 mL  3 mL Intravenous PRN Bartle, Bryan K, MD       traMADol  (ULTRAM ) tablet 50-100 mg  50-100 mg Oral Q4H PRN Bartle, Bryan K, MD   50 mg at 05/21/24 1107   traZODone  (DESYREL ) tablet 150 mg  150 mg Oral QHS Bartle, Bryan K, MD   150 mg at 05/20/24 2206     Discharge Medications: Please see discharge summary for a list of discharge medications.  Relevant Imaging Results:  Relevant Lab Results:   Additional Information [id number]  Isaiah Public, LCSWA

## 2024-05-21 NOTE — TOC PASRR Note (Signed)
 RE: Becky Lawson  Date of Birth: 04-06-1951  Date: 05/21/2024    To Whom It May Concern:   Please be advised that the above-named patient will require a short-term nursing home stay - anticipated 30 days or less for rehabilitation and strengthening. The plan is for return home

## 2024-05-21 NOTE — Progress Notes (Signed)
 OT EVAL  Patient is s/p Cabg x2 surgery resulting in functional limitations due to the deficits listed below (see OT problem list). Pt at baseline indep with all adls living alone without family (A). Pt at this time given sternal precaution and reviewed for adls. Pt educated on Ps for energy conservation.  Patient will benefit from skilled OT acutely to increase independence and safety with ADLS to allow discharge skilled inpatient follow up therapy, <3 hours/day. .   05/21/24 1200  OT Visit Information  Last OT Received On 05/21/24  PT/OT/SLP Co-Evaluation/Treatment Yes  Reason for Co-Treatment For patient/therapist safety;To address functional/ADL transfers  OT goals addressed during session ADL's and self-care  History of Present Illness 73 yo female s/p 9/12 CABG x2 PMH Knee surgery bipolar HLD R THA L THA  Precautions  Precautions Sternal  Recall of Precautions/Restrictions Intact  Precaution/Restrictions Comments sternal precautions  Restrictions  Weight Bearing Restrictions Per Provider Order Yes  RUE Weight Bearing Per Provider Order NWB  LUE Weight Bearing Per Provider Order NWB  Home Living  Family/patient expects to be discharged to: Skilled nursing facility  Living Arrangements Alone  Additional Comments 2 outdoor cats that neighbor is feeding  Prior Function  Prior Level of Function  Independent/Modified Independent;Driving  ADLs Comments retired in Programmer, applications and nursing, working as Lawyer in homes  Pain Assessment  Pain Assessment 0-10  Pain Score 2  Pain Location sternum  Pain Descriptors / Indicators Discomfort  Pain Intervention(s) Limited activity within patient's tolerance;Monitored during session;Premedicated before session  Cognition  Arousal Alert  Behavior During Therapy [facility] for tasks assessed/performed  Cognition No apparent impairments  Following Commands  Following commands Intact  Communication  Communication No apparent difficulties  Upper  Extremity Assessment  Upper Extremity Assessment Left hand dominant;Overall WFL for tasks assessed  Vision- History  Baseline Vision/History 1 Wears glasses  Ability to See in Adequate Light 0 Adequate  Patient Visual Report No change from baseline  Vision- Assessment  Vision Assessment? No apparent visual deficits  ADL  Overall ADL's  Needs assistance/impaired  Eating/Feeding Modified independent  Grooming Wash/dry face  Upper Body Bathing Modified independent  Lower Body Bathing Moderate assistance  Toilet Transfer Contact guard assist  Toileting- Clothing Manipulation and Hygiene Moderate assistance  Bed Mobility  Overal bed mobility Needs Assistance  Bed Mobility Supine to Sit  Supine to sit Contact guard;HOB elevated;+2 for safety/equipment  General bed mobility comments CGA +2 for safety and line/lead management. Cues to maintain sternal precautions.  Transfers  Overall transfer level Needs assistance  Equipment used  Winfred walker)  Transfers Sit to/from Stand  Sit to [employer] guard assist  General transfer comment CGA for safety and equipment mangement. good power up to stand, eva walker to steady herself upon standing. Adjusted accordingly. Cues for hand placement, holding heart pillow to sit.  Balance  Overall balance assessment Mild deficits observed, not formally tested  OT - End of Session  Equipment Utilized During Treatment Other (comment) (eva)  Activity Tolerance Patient tolerated treatment well  Patient left in chair;with call bell/phone within reach  Nurse Communication Mobility status;Precautions  OT Assessment  OT Recommendation/Assessment Patient needs continued OT Services  OT Visit Diagnosis Unsteadiness on feet (R26.81);Muscle weakness (generalized) (M62.81)  OT Problem List Decreased activity tolerance;Decreased safety awareness;Decreased knowledge of use of DME or AE;Decreased knowledge of precautions;Cardiopulmonary status limiting activity  OT  Plan  OT Frequency (ACUTE ONLY) Min 1X/week  OT Treatment/Interventions (ACUTE ONLY) Self-care/ADL training;Therapeutic exercise;Energy conservation;DME  and/or AE instruction;Therapeutic activities;Patient/family education;Balance training  AM-PAC OT 6 Clicks Daily Activity Outcome Measure (Version 2)  Help from another person eating meals? 4  Help from another person taking care of personal grooming? 4  Help from another person toileting, which includes using toliet, bedpan, or urinal? 3  Help from another person bathing (including washing, rinsing, drying)? 3  Help from another person to put on and taking off regular upper body clothing? 3  Help from another person to put on and taking off regular lower body clothing? 3  6 Click Score 20  Progressive Mobility  What is the highest level of mobility based on the mobility assessment? Level 4 (Ambulates with assistance) - Balance while stepping forward/back - Complete  Mobility Referral Yes  Activity Ambulated with assistance  OT Recommendation  Follow Up Recommendations Skilled nursing-short term rehab (<3 hours/day)  Patient can return home with the following A little help with walking and/or transfers;A little help with bathing/dressing/bathroom  Functional Status Assessent Patient has had a recent decline in their functional status and demonstrates the ability to make significant improvements in function in a reasonable and predictable amount of time.  OT Equipment BSC/3in1;Other (comment) (RW)  Individuals Consulted  Consulted and Agree with Results and Recommendations Patient  Acute Rehab OT Goals  Patient Stated Goal to get rehab  OT Goal Formulation With patient  Time For Goal Achievement 06/04/24  Potential to Achieve Goals Good  OT Time Calculation  OT Start Time (ACUTE ONLY) 1212  OT Stop Time (ACUTE ONLY) 1235  OT Time Calculation (min) 23 min  OT General Charges  $OT Visit 1 Visit  OT Evaluation  $OT Eval Moderate  Complexity 1 Mod   Brynn, OTR/L  Acute Rehabilitation Services Office: [phone] .

## 2024-05-21 NOTE — Progress Notes (Addendum)
 First attempt to call report to RN. RN busy. Will try again in 15 mins.   2033: attempt 2

## 2024-05-21 NOTE — Evaluation (Signed)
 Physical Therapy Evaluation Patient Details Name: Becky Lawson MRN: [id number] DOB: Feb 02, 1951 Today's Date: 05/21/2024  History of Present Illness  73 yo female admitted 9/12 s/p CABG x2. Postop atrial fib converted on IV amio. PMH Knee surgery bipolar HLD R THA L THA  Clinical Impression  Patient is s/p above surgery resulting in functional limitations due to the deficits listed below (see PT Problem List). Previously independent. Lives alone with little to no support reported. States she plans to go to rehab to regain function prior to returning home. Mobilizing at a CGA level for safety, up to +2 assist for equipment management and safety today. Required Elyn Finder, but agreeable to try RW next visit. SpO2 88% on RA while ambulating, low to mid 90s at rest. HR max at 113 with exertion. One standing rest break due to fatigue while ambulating. Reported dizziness after ambulating with MAP of 100.   Patient will benefit from acute skilled PT to increase their independence and safety with mobility to facilitate discharge.           If plan is discharge home, recommend the following: A little help with walking and/or transfers;A little help with bathing/dressing/bathroom;Assistance with cooking/housework;Assist for transportation;Help with stairs or ramp for entrance   Can travel by private vehicle   Yes    Equipment Recommendations Rollator (4 wheels) (TBD pending progress)  Recommendations for Other Services       Functional Status Assessment Patient has had a recent decline in their functional status and demonstrates the ability to make significant improvements in function in a reasonable and predictable amount of time.     Precautions / Restrictions Precautions Precautions: Sternal Precaution Booklet Issued: Yes (comment) Recall of Precautions/Restrictions: Intact Precaution/Restrictions Comments: sternal precautions Restrictions Weight Bearing Restrictions Per Provider Order:  Yes RUE Weight Bearing Per Provider Order: Non weight bearing LUE Weight Bearing Per Provider Order: Non weight bearing Other Position/Activity Restrictions: Cardiac sternal prec      Mobility  Bed Mobility Overal bed mobility: Needs Assistance Bed Mobility: Supine to Sit     Supine to sit: Contact guard, HOB elevated, +2 for safety/equipment     General bed mobility comments: CGA +2 for safety and line/lead management. Cues to maintain sternal precautions.    Transfers Overall transfer level: Needs assistance Equipment used:  Winfred walker) Transfers: Sit to/from Stand Sit to Stand: Contact guard assist           General transfer comment: CGA for safety and equipment mangement. good power up to stand, eva walker to steady herself upon standing. Adjusted accordingly. Cues for hand placement, holding heart pillow to sit.    Ambulation/Gait Ambulation/Gait assistance: Contact guard assist Gait Distance (Feet): 315 Feet Assistive device: Elyn Finder Gait Pattern/deviations: Decreased stride length, Trunk flexed Gait velocity: dec Gait velocity interpretation: <1.8 ft/sec, indicate of risk for recurrent falls   General Gait Details: Cues for upright posture, limiting WB through UEs to maintain sternal precautions in vertical position. Grossly stable with this device. Declines to practice RW this visit but open to try next therapy session. No buckling. CGA for safety with turns. SpO2 88% on RA. One standing rest break due to fatigue. HR max at 113.  Stairs            Wheelchair Mobility     Tilt Bed    Modified Rankin (Stroke Patients Only)       Balance Overall balance assessment: Mild deficits observed, not formally tested  Pertinent Vitals/Pain Pain Assessment Pain Assessment: 0-10 Pain Score: 2  Pain Location: sternum Pain Descriptors / Indicators: Discomfort Pain Intervention(s): Limited  activity within patient's tolerance, Monitored during session, Repositioned    Home Living Family/patient expects to be discharged to:: Skilled nursing facility (per pt request) Living Arrangements: Alone                 Additional Comments: 2 outdoor cats that neighbor is feeding    Prior Function Prior Level of Function : Independent/Modified Independent             Mobility Comments: ind ADLs Comments: retired in Programmer, applications and nursing, working as Lawyer in homes     Extremity/Trunk Assessment   Upper Extremity Assessment Upper Extremity Assessment: Defer to OT evaluation    Lower Extremity Assessment Lower Extremity Assessment: Generalized weakness       Communication   Communication Communication: No apparent difficulties    Cognition Arousal: Alert Behavior During Therapy: WFL for tasks assessed/performed   PT - Cognitive impairments: No apparent impairments                         Following commands: Intact       Cueing Cueing Techniques: Verbal cues     General Comments      Exercises General Exercises - Lower Extremity Ankle Circles/Pumps: AROM, Both, 10 reps, Supine Quad Sets: Strengthening, Both, 10 reps, Seated Gluteal Sets: Strengthening, Both, 10 reps, Seated   Assessment/Plan    PT Assessment Patient needs continued PT services  PT Problem List Decreased strength;Decreased range of motion;Decreased activity tolerance;Decreased balance;Decreased mobility;Decreased knowledge of use of DME;Decreased safety awareness;Decreased knowledge of precautions;Cardiopulmonary status limiting activity;Pain       PT Treatment Interventions DME instruction;Gait training;Stair training;Functional mobility training;Therapeutic activities;Therapeutic exercise;Balance training;Neuromuscular re-education;Patient/family education;Modalities    PT Goals (Current goals can be found in the Care Plan section)  Acute Rehab PT Goals Patient  Stated Goal: Get rehab before going hom alone PT Goal Formulation: With patient Time For Goal Achievement: 06/04/24 Potential to Achieve Goals: Good    Frequency Min 2X/week     Co-evaluation PT/OT/SLP Co-Evaluation/Treatment: Yes Reason for Co-Treatment: For patient/therapist safety;To address functional/ADL transfers PT goals addressed during session: Mobility/safety with mobility;Balance;Proper use of DME;Strengthening/ROM OT goals addressed during session: (P) ADL's and self-care       AM-PAC PT 6 Clicks Mobility  Outcome Measure Help needed turning from your back to your side while in a flat bed without using bedrails?: A Little Help needed moving from lying on your back to sitting on the side of a flat bed without using bedrails?: A Little Help needed moving to and from a bed to a chair (including a wheelchair)?: A Little Help needed standing up from a chair using your arms (e.g., wheelchair or bedside chair)?: A Little Help needed to walk in hospital room?: A Little Help needed climbing 3-5 steps with a railing? : Total 6 Click Score: 16    End of Session   Activity Tolerance: Patient tolerated treatment well Patient left: in chair;with call bell/phone within reach Nurse Communication: Mobility status PT Visit Diagnosis: Unsteadiness on feet (R26.81);Other abnormalities of gait and mobility (R26.89);Muscle weakness (generalized) (M62.81);Difficulty in walking, not elsewhere classified (R26.2);Pain Pain - part of body:  (sternum)    Time: 8786-8766 PT Time Calculation (min) (ACUTE ONLY): 20 min   Charges:   PT Evaluation $PT Eval Low Complexity: 1 Low   PT General  Charges $$ ACUTE PT VISIT: 1 Visit         Leontine Roads, PT, DPT [facility]  Rehabilitation Services Physical Therapist Office: [phone] Website: [email or web address]   Leontine GORMAN Roads 05/21/2024, 2:04 PM

## 2024-05-21 NOTE — TOC Progression Note (Signed)
 Transition of Care [facility]) - Progression Note    Patient Details  Name: Becky Lawson MRN: [id number] Date of Birth: [birth year]  Transition of [facility]) CM/SW Contact  Isaiah Public, LCSWA Phone Number: 05/21/2024, 4:24 PM  Clinical Narrative:     CSW faxed patient out for SNF. Patients passr currently pending. CSW awaiting FL2 and 30 day note to be cosigned by MD then will submit requested clinicals to [place] must for review. CSW will continue to follow.  Expected Discharge Plan: Skilled Nursing Facility Barriers to Discharge: Continued Medical Work up               Expected Discharge Plan and Services In-house Referral: Clinical Social Work Discharge Planning Services: CM Consult Post Acute Care Choice: Skilled Nursing Facility Living arrangements for the past 2 months: Single Family Home                                       Social Drivers of Health (SDOH) Interventions SDOH Screenings   Food Insecurity: No Food Insecurity (05/20/2024)  Housing: Low Risk  (05/20/2024)  Transportation Needs: No Transportation Needs (05/20/2024)  Utilities: Not At Risk (05/20/2024)  Alcohol Screen: Low Risk  (03/19/2024)  Depression (PHQ2-9): Medium Risk (03/19/2024)  Financial Resource Strain: Low Risk  (05/14/2024)  Physical Activity: Sufficiently Active (05/14/2024)  Recent Concern: Physical Activity - Insufficiently Active (03/19/2024)  Social Connections: Moderately Integrated (05/20/2024)  Stress: No Stress Concern Present (05/14/2024)  Recent Concern: Stress - Stress Concern Present (03/19/2024)  Tobacco Use: Medium Risk (05/17/2024)  Health Literacy: Adequate Health Literacy (03/19/2024)    Readmission Risk Interventions     No data to display

## 2024-05-21 NOTE — Progress Notes (Signed)
 4 Days Post-Op Procedure(s) (LRB): CORONARY ARTERY BYPASS GRAFTING (CABG) X 2, USING BILATERAL INTERNAL MAMMARY ARTERIES (N/A) ECHOCARDIOGRAM, TRANSESOPHAGEAL, INTRAOPERATIVE (N/A) Subjective: No complaints.  Maintained NSR yesterday on IV amio.  Objective: Vital signs in last 24 hours: Temp:  [97.5 F (36.4 C)-99.5 F (37.5 C)] 98.5 F (36.9 C) (09/16 0311) Pulse Rate:  [67-151] 75 (09/16 0300) Cardiac Rhythm: Normal sinus rhythm (09/15 2000) Resp:  [14-33] 19 (09/16 0300) BP: (80-150)/(47-92) 125/57 (09/16 0300) SpO2:  [84 %-100 %] 93 % (09/16 0300)  Hemodynamic parameters for last 24 hours:    Intake/Output from previous day: 09/15 0701 - 09/16 0700 In: 1282.1 [P.O.:240; I.V.:1042.1] Out: 5745 [Urine:5745] Intake/Output this shift: Total I/O In: 198.3 [I.V.:198.3] Out: 1820 [Urine:1820]  General appearance: alert and cooperative Neurologic: intact Heart: regular rate and rhythm Lungs: clear to auscultation bilaterally Extremities: edema mild Wound: incision healing well  Lab Results: Recent Labs    05/20/24 0546 05/21/24 0346  WBC 5.8 5.5  HGB 8.0* 7.8*  HCT 25.0* 24.5*  PLT 145* 175   BMET:  Recent Labs    05/20/24 0546 05/21/24 0346  NA 133* 136  K 4.7 4.3  CL 102 102  CO2 23 26  GLUCOSE 103* 122*  BUN 10 11  CREATININE 0.82 0.93  CALCIUM  7.9* 8.0*    PT/INR: No results for input(s): LABPROT, INR in the last 72 hours. ABG    Component Value Date/Time   PHART 7.306 (L) 05/17/2024 1818   HCO3 23.3 05/17/2024 1818   TCO2 25 05/17/2024 1818   ACIDBASEDEF 3.0 (H) 05/17/2024 1818   O2SAT 97 05/17/2024 1818   CBG (last 3)  Recent Labs    05/20/24 0737 05/20/24 2102 05/21/24 0606  GLUCAP 140* 146* 117*    Assessment/Plan: S/P Procedure(s) (LRB): CORONARY ARTERY BYPASS GRAFTING (CABG) X 2, USING BILATERAL INTERNAL MAMMARY ARTERIES (N/A) ECHOCARDIOGRAM, TRANSESOPHAGEAL, INTRAOPERATIVE (N/A)  POD 4 Hemodynamically stable  Postop  atrial fib converted on IV amio. Will transition back to po Pacerone . Continue Lopressor .   -4462 cc yesterday with diuresis. Wt down to 197. Still 7 lbs over preop wt. Continue daily lasix  and KCl.  DC sleeve and foley.  Continue ASA and Plavix  for LCx stent placed recently.  Transfer to 4E and continue ambulation, IS.  Social work consult for SNF placement. She lives alone and has no family around or friends who can stay with her.   LOS: 4 days    Becky Lawson MARLA Fellers 05/21/2024

## 2024-05-21 NOTE — Progress Notes (Signed)
   [address], [place] [address]             [phone]   POD # 4  BP 138/72   Pulse 89   Temp 97.6 F (36.4 C) (Axillary)   Resp 14   Ht 5' 4.5 (1.638 m)   Wt 92.4 kg   SpO2 97%   BMI 34.44 kg/m    Intake/Output Summary (Last 24 hours) at 05/21/2024 1746 Last data filed at 05/21/2024 1500 Gross per 24 hour  Intake 299.11 ml  Output 4645 ml  Net -4345.89 ml   Awaiting bed on 4E  Lateya Dauria C. Kerrin, MD [facility] [phone]

## 2024-05-22 LAB — BASIC METABOLIC PANEL WITH GFR
Anion gap: 7 (ref 5–15)
BUN: 12 mg/dL (ref 8–23)
CO2: 27 mmol/L (ref 22–32)
Calcium: 8.6 mg/dL — ABNORMAL LOW (ref 8.9–10.3)
Chloride: 103 mmol/L (ref 98–111)
Creatinine, Ser: 1.02 mg/dL — ABNORMAL HIGH (ref 0.44–1.00)
GFR, Estimated: 58 mL/min — ABNORMAL LOW (ref >60)
Glucose, Bld: 110 mg/dL — ABNORMAL HIGH (ref 70–99)
Potassium: 4.5 mmol/L (ref 3.5–5.1)
Sodium: 137 mmol/L (ref 135–145)

## 2024-05-22 LAB — CBC
HCT: 25.8 % — ABNORMAL LOW (ref 36.0–46.0)
Hemoglobin: 8.2 g/dL — ABNORMAL LOW (ref 12.0–15.0)
MCH: 30.6 pg (ref 26.0–34.0)
MCHC: 31.8 g/dL (ref 30.0–36.0)
MCV: 96.3 fL (ref 80.0–100.0)
Platelets: 241 K/uL (ref 150–400)
RBC: 2.68 MIL/uL — ABNORMAL LOW (ref 3.87–5.11)
RDW: 13.1 % (ref 11.5–15.5)
WBC: 5.8 K/uL (ref 4.0–10.5)
nRBC: 0 % (ref 0.0–0.2)

## 2024-05-22 LAB — TYPE AND SCREEN
ABO/RH(D): A NEG
Antibody Screen: NEGATIVE
Unit division: 0
Unit division: 0
Unit division: 0
Unit division: 0

## 2024-05-22 LAB — BPAM RBC
Blood Product Expiration Date: 202509222359
Blood Product Expiration Date: 202509222359
Blood Product Expiration Date: 202509222359
Blood Product Expiration Date: 202509222359
ISSUE DATE / TIME: 202509121026
ISSUE DATE / TIME: 202509121026
Unit Type and Rh: 600
Unit Type and Rh: 600
Unit Type and Rh: 600
Unit Type and Rh: 600

## 2024-05-22 MED ORDER — METOPROLOL TARTRATE 25 MG PO TABS
37.5000 mg | ORAL_TABLET | Freq: Two times a day (BID) | ORAL | Status: DC
  Administered 2024-05-22 – 2024-05-23 (×3): 37.5 mg via ORAL
  Filled 2024-05-22 (×3): qty 1

## 2024-05-22 MED ORDER — DOCUSATE SODIUM 100 MG PO CAPS
100.0000 mg | ORAL_CAPSULE | Freq: Two times a day (BID) | ORAL | Status: DC | PRN
  Administered 2024-05-22: 100 mg via ORAL
  Filled 2024-05-22: qty 1

## 2024-05-22 NOTE — Plan of Care (Signed)

## 2024-05-22 NOTE — Progress Notes (Signed)
 Patient requested something to help make her bowels move. Patient stated that, she had a BM this AM which was soft.   When she went shortly after shift change, patient stated that, stool was hard, little balls, and looked to be black and tarry.   Paged Con, [place] for TCTS. Orders for colace PRN and hemocult sample.  Will continue to monitor.

## 2024-05-22 NOTE — TOC Progression Note (Signed)
 Transition of Care [facility]) - Progression Note    Patient Details  Name: Becky Lawson MRN: [id number] Date of Birth: 1951/07/06  Transition of Care [facility]) CM/SW Contact  Almarie CHRISTELLA Goodie, [place] Phone Number: 05/22/2024, 1:18 PM  Clinical Narrative:   CSW met with patient to provide bed offers. Patient said she would review the options available to determine choice. CSW to follow.    Expected Discharge Plan: Skilled Nursing Facility Barriers to Discharge: Continued Medical Work up, English as a second language teacher               Expected Discharge Plan and Services In-house Referral: Clinical Social Work Discharge Planning Services: [employer] Consult Post Acute Care Choice: Skilled Nursing Facility Living arrangements for the past 2 months: Single Family Home                                       Social Drivers of Health (SDOH) Interventions SDOH Screenings   Food Insecurity: No Food Insecurity (05/20/2024)  Housing: Low Risk  (05/20/2024)  Transportation Needs: No Transportation Needs (05/20/2024)  Utilities: Not At Risk (05/20/2024)  Alcohol Screen: Low Risk  (03/19/2024)  Depression (PHQ2-9): Medium Risk (03/19/2024)  Financial Resource Strain: Low Risk  (05/14/2024)  Physical Activity: Sufficiently Active (05/14/2024)  Recent Concern: Physical Activity - Insufficiently Active (03/19/2024)  Social Connections: Moderately Integrated (05/20/2024)  Stress: No Stress Concern Present (05/14/2024)  Recent Concern: Stress - Stress Concern Present (03/19/2024)  Tobacco Use: Medium Risk (05/17/2024)  Health Literacy: Adequate Health Literacy (03/19/2024)    Readmission Risk Interventions     No data to display

## 2024-05-22 NOTE — Progress Notes (Addendum)
 [address] [employer] [address]             [phone]      5 Days Post-Op Procedure(s) (LRB): CORONARY ARTERY BYPASS GRAFTING (CABG) X 2, USING BILATERAL INTERNAL MAMMARY ARTERIES (N/A) ECHOCARDIOGRAM, TRANSESOPHAGEAL, INTRAOPERATIVE (N/A) Subjective: Patient reports she has no pain this AM, sitting in the chair eating breakfast.   Objective: Vital signs in last 24 hours: Temp:  [97.6 F (36.4 C)-99.4 F (37.4 C)] 97.6 F (36.4 C) (09/17 0308) Pulse Rate:  [76-105] 84 (09/17 0308) Cardiac Rhythm: Normal sinus rhythm (09/16 2120) Resp:  [14-30] 20 (09/17 0308) BP: (93-163)/(51-81) 133/69 (09/17 0308) SpO2:  [93 %-100 %] 100 % (09/17 0308) Weight:  [89.1 kg] 89.1 kg (09/17 0308)  Hemodynamic parameters for last 24 hours:    Intake/Output from previous day: 09/16 0701 - 09/17 0700 In: 84.1 [I.V.:84.1] Out: 825 [Urine:825] Intake/Output this shift: No intake/output data recorded.  General appearance: alert, cooperative, and no distress Neurologic: intact Heart: regular rate and rhythm, systolic murmur left sternal border, no rub Lungs: slightly diminished left basilar breath sounds Abdomen: soft, non-tender; bowel sounds normal; no masses,  no organomegaly Extremities: edema 1+ BLE Wound: Clean and dry without sign of infection  Lab Results: Recent Labs    05/21/24 0346 05/22/24 0339  WBC 5.5 5.8  HGB 7.8* 8.2*  HCT 24.5* 25.8*  PLT 175 241   BMET:  Recent Labs    05/21/24 0346 05/22/24 0339  NA 136 137  K 4.3 4.5  CL 102 103  CO2 26 27  GLUCOSE 122* 110*  BUN 11 12  CREATININE 0.93 1.02*  CALCIUM  8.0* 8.6*    PT/INR: No results for input(s): LABPROT, INR in the last 72 hours. ABG    Component Value Date/Time   PHART 7.306 (L) 05/17/2024 1818   HCO3 23.3 05/17/2024 1818   TCO2 25 05/17/2024 1818   ACIDBASEDEF 3.0 (H) 05/17/2024 1818   O2SAT 97 05/17/2024 1818   CBG (last 3)  Recent Labs    05/21/24 0606  05/21/24 1128 05/21/24 1609  GLUCAP 117* 113* 106*    Assessment/Plan: S/P Procedure(s) (LRB): CORONARY ARTERY BYPASS GRAFTING (CABG) X 2, USING BILATERAL INTERNAL MAMMARY ARTERIES (N/A) ECHOCARDIOGRAM, TRANSESOPHAGEAL, INTRAOPERATIVE (N/A)  Neuro: Patient has chronic pain on Percocet 10-325mg  Q6H PRN at home. Pain controlled on current regimen. Will restart home meds at discharge.   Bipolar disorder: On home Seroquel , desyrel , Lamictal  and Xanax   CV: Hx of postop afib. 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 A few short bursts of HR into the 120s but looked ST to me on tele. Plan to d/c EPW today. On PO Amiodarone  400mg  BID and Lopressor  25mg  BID. BP slightly elevated, SBP mostly 130s-160s. Will titrate Lopressor . Recent stent of Lcx, on Plavix  75mg  and ASA 81mg .   Pulm: Saturating well on RA. Last CXR with minimal left basilar atelectasis and trace pleural effusion. Encourage IS and ambulation.  GI: +BM 09/15, tolerating a diet  Endo: No hx of DM, CBGs and SSI have been d/c'd. Hypothyroid, on home levothyroxine .   Renal: Cr 1.02, slightly increased from 0.93 yesterday. UO 825cc/24hrs recorded. +6lbs from preop weight. Continue PO Lasix  40mg  daily. K 4.5, at goal.   ID: No leukocytosis, Tmax 99.4 clinically monitor.   Expected postop ABLA: H/H 8.2/25.8, improved. Not clinically significant at this time.   DVT Prophylaxis: Lovenox   Deconditioning: Continue work with PT/OT  Dispo: Patient  lives alone, will require SNF at discharge. Hopefully can d/c to SNF by the end of the week.   LOS: 5 days    Con [name], PA-C 05/22/2024   Chart reviewed, patient examined, agree with above.  She is maintaining sinus on po amio and Lopressor . Overall she feels well and is ambulating. Working on SNF placement.

## 2024-05-22 NOTE — TOC Progression Note (Addendum)
 Transition of Care [facility]) - Progression Note    Patient Details  Name: Becky Lawson MRN: [id number] Date of Birth: 08/19/1951  Transition of Care [facility]) CM/SW Contact  Montie [name], [place] Phone Number: 05/22/2024, 1:59 PM  Clinical Narrative:     CSW spoke with patient by phone- informed [facility] declined, no bed availability. Patient decided on [facility].  [facility] confirmed availability.  Left voice message for health team advantage - will need to start authorization for PTAR and SNF.- waiting on call back.  PSARR# pending- uploaded clinicals   Montie Louder, MSW, LCSW Clinical Social Worker    Expected Discharge Plan: Skilled Nursing Facility Barriers to Discharge: Continued Medical Work up, English as a second language teacher               Expected Discharge Plan and Services In-house Referral: Clinical Social Work Discharge Planning Services: [employer] Consult Post Acute Care Choice: Skilled Nursing Facility Living arrangements for the past 2 months: Single Family Home                                       Social Drivers of Health (SDOH) Interventions SDOH Screenings   Food Insecurity: No Food Insecurity (05/20/2024)  Housing: Low Risk  (05/20/2024)  Transportation Needs: No Transportation Needs (05/20/2024)  Utilities: Not At Risk (05/20/2024)  Alcohol Screen: Low Risk  (03/19/2024)  Depression (PHQ2-9): Medium Risk (03/19/2024)  Financial Resource Strain: Low Risk  (05/14/2024)  Physical Activity: Sufficiently Active (05/14/2024)  Recent Concern: Physical Activity - Insufficiently Active (03/19/2024)  Social Connections: Moderately Integrated (05/20/2024)  Stress: No Stress Concern Present (05/14/2024)  Recent Concern: Stress - Stress Concern Present (03/19/2024)  Tobacco Use: Medium Risk (05/17/2024)  Health Literacy: Adequate Health Literacy (03/19/2024)    Readmission Risk Interventions     No data to display

## 2024-05-22 NOTE — Progress Notes (Signed)
 Patient informed this RN that she changed her mind and is going home instead of SNF. Patient stated that, the ratings for the possible SNFs weren't very good.    Patient stated that, she has a family member that is a Engineer, civil (consulting) in [place]  that will come down and stay with her when she d/cs.  Will pass along to days.

## 2024-05-22 NOTE — Anesthesia Procedure Notes (Addendum)
 Central Venous Catheter Insertion Performed by: Keneth Lynwood POUR, MD, anesthesiologist Start/End9/08/2024 7:00 AM, 05/17/2024 7:15 AM Patient location: Pre-op. Preanesthetic checklist: patient identified, IV checked, site marked, risks and benefits discussed, surgical consent, monitors and equipment checked, pre-op evaluation, timeout performed and anesthesia consent Lidocaine  1% used for infiltration and patient sedated Hand hygiene performed  and maximum sterile barriers used  Catheter size: 8.5 Fr Total catheter length 80. Central line and PA cath was placed.MAC introducer Swan type:thermodilution PA Cath depth:50 Procedure performed using ultrasound guided technique. Ultrasound Notes:anatomy identified, needle tip was noted to be adjacent to the nerve/plexus identified, no ultrasound evidence of intravascular and/or intraneural injection and image(s) printed for medical record Attempts: 1 Following insertion, line sutured and dressing applied. Post procedure assessment: blood return through all ports, free fluid flow and no air  Patient tolerated the procedure well with no immediate complications.

## 2024-05-22 NOTE — Addendum Note (Signed)
 Addendum  created 05/22/24 1855 by Keneth Lynwood POUR, MD   Child order released for a procedure order, Clinical Note Signed, Intraprocedure Blocks edited, LDA created via procedure documentation, SmartForm saved

## 2024-05-22 NOTE — Progress Notes (Signed)
 Physical Therapy Treatment Patient Details Name: Becky Lawson MRN: [id number] DOB: Jul 01, 1951 Today's Date: 05/22/2024   History of Present Illness 73 yo female admitted 9/12 s/p CABG x2. Postop atrial fib converted on IV amio. MD plans to remove pacer wires 9/17 after PT session. PMH Knee surgery bipolar HLD R THA L THA    PT Comments  Pt received in chair, pleasantly agreeable to therapy session and with good participation and tolerance for transfer, gait and stair training. Pt consistently needing cues for compliance with sternal precs during functional mobility tasks and up to heavy minA for bed mobility and stair negotiation. Utilized smaller step than pt typically uses for safety but plan to have pt ascend/descend standard height 7 steps next session. Pt making good progress, able to perform gait trial with rollator on room air and HR/SpO2 [facility] and BP stable standing. Patient will benefit from continued inpatient follow up therapy, <3 hours/day as she has no support at home and needing physical assist to perform functional mobility tasks while complying with post-op sternal precs.     If plan is discharge home, recommend the following: A little help with walking and/or transfers;A little help with bathing/dressing/bathroom;Assistance with cooking/housework;Assist for transportation;Help with stairs or ramp for entrance   Can travel by private vehicle     Yes  Equipment Recommendations  Rollator (4 wheels)    Recommendations for Other Services       Precautions / Restrictions Precautions Precautions: Sternal Precaution Booklet Issued: Yes (comment) Recall of Precautions/Restrictions: Intact Precaution/Restrictions Comments: sternal precautions, needs reminders during ftnl tasks 50% Restrictions Weight Bearing Restrictions Per Provider Order: No Other Position/Activity Restrictions: Cardiac sternal prec     Mobility  Bed Mobility Overal bed mobility: Needs Assistance Bed  Mobility: Sit to Sidelying, Rolling         Sit to sidelying: Min assist, HOB elevated General bed mobility comments: Pt unable to return to supine without assist for BLE while maintaining her sternal precs    Transfers Overall transfer level: Needs assistance Equipment used: Rollator (4 wheels) Transfers: Sit to/from Stand Sit to Stand: Contact guard assist           General transfer comment: CGA for safety and equipment mangement. good power up to stand, from cushioned chair and elevated BSC heights. Cues for arms in lap/elbows tucked in before transfers    Ambulation/Gait Ambulation/Gait assistance: Contact guard assist Gait Distance (Feet): 100 Feet (165ft, seated break, 59ft) Assistive device: Rollator (4 wheels) Gait Pattern/deviations: Decreased stride length, Trunk flexed, Drifts right/left Gait velocity: dec     General Gait Details: Cues for upright posture, limiting WB through UEs to maintain sternal precautions in vertical position. No buckling. SpO2 WFL on RA after new sensor placed prior to trial, HR 90's bpm with exertion.   Stairs Stairs: Yes Stairs assistance: Min assist Stair Management: One rail Left, Step to pattern, Backwards, Forwards Number of Stairs: 5 (single 3.5 curb step x5 reps x2 sets with rest break) General stair comments: intermittent minA via RUE HHA and pt using LUE on counter to simulate L rail, cues for sequencing/activity pacing and safety. Pt having difficulty deciding which leg feels stronger so tending to alternate which leg leads. Light minA for stability due to occasional imbalance upon stepping up. CGA for some reps, minA for others.   Wheelchair Mobility     Tilt Bed    Modified Rankin (Stroke Patients Only)       Balance Overall balance assessment: Mild deficits  observed, not formally tested                                          Communication Communication Communication: No apparent  difficulties  Cognition Arousal: Alert Behavior During Therapy: WFL for tasks assessed/performed   PT - Cognitive impairments: No apparent impairments, Other                       PT - Cognition Comments: Needs cues for sternal precs with dynamic standing tasks 50% of the time and for bed mobility 75% of the time. Following commands: Intact      Cueing Cueing Techniques: Verbal cues, Gestural cues, Visual cues  Exercises General Exercises - Lower Extremity Ankle Circles/Pumps: AROM, Both, 5 reps, Supine    General Comments General comments (skin integrity, edema, etc.): BP 155/70 (96) standing post-exertion, pt reported mild dizziness during gait trial but resolved with rest break in bathroom to urinate. No BM. HR 81-85 resting and to 90's bpm with trial. SpO2 97-100% on RA with activity/gait.      Pertinent Vitals/Pain Pain Assessment Pain Assessment: 0-10 Pain Score: 2  Pain Location: sternum after bed mobility; minimal to no with ambulation Pain Descriptors / Indicators: Discomfort, Guarding Pain Intervention(s): Limited activity within patient's tolerance, Monitored during session, Repositioned    Home Living                          Prior Function            PT Goals (current goals can now be found in the care plan section) Acute Rehab PT Goals Patient Stated Goal: Get rehab before going hom alone PT Goal Formulation: With patient Time For Goal Achievement: 06/04/24 Progress towards PT goals: Progressing toward goals    Frequency    Min 2X/week      PT Plan      Co-evaluation              AM-PAC PT 6 Clicks Mobility   Outcome Measure  Help needed turning from your back to your side while in a flat bed without using bedrails?: A Little Help needed moving from lying on your back to sitting on the side of a flat bed without using bedrails?: A Lot (heavy minA, dense cues) Help needed moving to and from a bed to a chair (including  a wheelchair)?: A Little Help needed standing up from a chair using your arms (e.g., wheelchair or bedside chair)?: A Little Help needed to walk in hospital room?: A Little Help needed climbing 3-5 steps with a railing? : A Lot (mod safety cues) 6 Click Score: 16    End of Session Equipment Utilized During Treatment: Gait belt Activity Tolerance: Patient tolerated treatment well Patient left: with call bell/phone within reach;in bed;with bed alarm set;Other (comment) (heels floated for comfort; awaiting care team to pull pacer wires) Nurse Communication: Mobility status PT Visit Diagnosis: Unsteadiness on feet (R26.81);Other abnormalities of gait and mobility (R26.89);Muscle weakness (generalized) (M62.81);Difficulty in walking, not elsewhere classified (R26.2);Pain Pain - part of body:  (sternum)     Time: 8994-8961 PT Time Calculation (min) (ACUTE ONLY): 33 min  Charges:    $Gait Training: 8-22 mins $Therapeutic Activity: 8-22 mins PT General Charges $$ ACUTE PT VISIT: 1 Visit  Connell SQUIBB., PTA Acute Rehabilitation Services Secure Chat Preferred 9a-5:30pm Office: [phone]    Connell HERO [facility] 05/22/2024, 11:29 AM

## 2024-05-22 NOTE — Progress Notes (Signed)
 SATURATION QUALIFICATIONS: (This note is used to comply with regulatory documentation for home oxygen)  Patient Saturations on Room Air at Rest = 95%  Patient Saturations on Room Air while Ambulating = 97%   Please briefly explain why patient needs home oxygen: N/A, pt SpO2 WFL 97% on RA when good pleth signal achieved for exertional tasks.

## 2024-05-22 NOTE — Progress Notes (Signed)
 Pacing wires removed per order. Pt educated on bedrest. Tolerated well.    05/22/24 1142  Vitals  Temp 98.2 F (36.8 C)  Temp Source Oral  BP 135/66  MAP (mmHg) 87  BP Location Right Arm  BP Method Automatic  Patient Position (if appropriate) Lying  Pulse Rate 84  Pulse Rate Source Monitor  ECG Heart Rate 87  Resp (!) 21  Level of Consciousness  Level of Consciousness Alert  MEWS COLOR  MEWS Score Color Green  Oxygen Therapy  SpO2 96 %  O2 Device Room Air  O2 Flow Rate (L/min) 0 L/min  Pain Assessment  Pain Scale 0-10  Pain Score 0  MEWS Score  MEWS Temp 0  MEWS Systolic 0  MEWS Pulse 0  MEWS RR 1  MEWS LOC 0  MEWS Score 1

## 2024-05-23 LAB — BASIC METABOLIC PANEL WITH GFR
Anion gap: 14 (ref 5–15)
BUN: 12 mg/dL (ref 8–23)
CO2: 26 mmol/L (ref 22–32)
Calcium: 8.5 mg/dL — ABNORMAL LOW (ref 8.9–10.3)
Chloride: 95 mmol/L — ABNORMAL LOW (ref 98–111)
Creatinine, Ser: 0.93 mg/dL (ref 0.44–1.00)
GFR, Estimated: >60 mL/min (ref >60)
Glucose, Bld: 102 mg/dL — ABNORMAL HIGH (ref 70–99)
Potassium: 4.3 mmol/L (ref 3.5–5.1)
Sodium: 135 mmol/L (ref 135–145)

## 2024-05-23 LAB — OCCULT BLOOD X 1 CARD TO LAB, STOOL: Fecal Occult Bld: NEGATIVE

## 2024-05-23 MED ORDER — POLYETHYLENE GLYCOL 3350 17 G PO PACK
17.0000 g | PACK | Freq: Every day | ORAL | Status: DC
  Administered 2024-05-23: 17 g via ORAL
  Filled 2024-05-23 (×2): qty 1

## 2024-05-23 MED ORDER — DOCUSATE SODIUM 100 MG PO CAPS
100.0000 mg | ORAL_CAPSULE | Freq: Every day | ORAL | Status: DC
  Administered 2024-05-23 – 2024-05-24 (×2): 100 mg via ORAL
  Filled 2024-05-23 (×2): qty 1

## 2024-05-23 MED ORDER — METOPROLOL TARTRATE 50 MG PO TABS
50.0000 mg | ORAL_TABLET | Freq: Two times a day (BID) | ORAL | Status: DC
  Administered 2024-05-23 – 2024-05-25 (×4): 50 mg via ORAL
  Filled 2024-05-23 (×4): qty 1

## 2024-05-23 NOTE — TOC Progression Note (Signed)
 Transition of Care [facility]) - Progression Note    Patient Details  Name: Becky Lawson MRN: [id number] Date of Birth: 04-30-1951  Transition [facility]) CM/SW Contact  Montie [name], [place] Phone Number: 05/23/2024, 10:02 AM  Clinical Narrative:     [place] ADAS # [id number] E expires 06/21/24  Expected Discharge Plan: Skilled Nursing Facility Barriers to Discharge: Continued Medical Work up, English as a second language teacher               Expected Discharge Plan and Services In-house Referral: Clinical Social Work Discharge Planning Services: [employer] Consult Post Acute Care Choice: Skilled Nursing Facility Living arrangements for the past 2 months: Single Family Home                                       Social Drivers of Health (SDOH) Interventions SDOH Screenings   Food Insecurity: No Food Insecurity (05/20/2024)  Housing: Low Risk  (05/20/2024)  Transportation Needs: No Transportation Needs (05/20/2024)  Utilities: Not At Risk (05/20/2024)  Alcohol Screen: Low Risk  (03/19/2024)  Depression (PHQ2-9): Medium Risk (03/19/2024)  Financial Resource Strain: Low Risk  (05/14/2024)  Physical Activity: Sufficiently Active (05/14/2024)  Recent Concern: Physical Activity - Insufficiently Active (03/19/2024)  Social Connections: Moderately Integrated (05/20/2024)  Stress: No Stress Concern Present (05/14/2024)  Recent Concern: Stress - Stress Concern Present (03/19/2024)  Tobacco Use: Medium Risk (05/17/2024)  Health Literacy: Adequate Health Literacy (03/19/2024)    Readmission Risk Interventions     No data to display

## 2024-05-23 NOTE — Progress Notes (Signed)
 Pt received OHS book and education on restrictions, heart healthy diet, ex guidelines, Move in the Tube sheet, incentive spirometer use when d/c and CRPII. Pt denies questions and was encouraged to look in the book for additional information. Referral placed to [facility].   Garen FORBES Candy  MS, ACSM-CEP 12:18 PM 05/23/2024    Service time is from 1138 to 1206.

## 2024-05-23 NOTE — TOC Progression Note (Signed)
 Transition of Care [facility]) - Progression Note    Patient Details  Name: Becky Lawson MRN: [id number] Date of Birth: 1951/06/26  Transition of Care [facility]) CM/SW Contact  Montie [name], [place] Phone Number: 05/23/2024, 10:06 AM  Clinical Narrative:     Beatris with [facility] w/ HTA_ requested authorization for SNF/ [facility] and PTAR-  TOC waiting on determination  Montie Louder, MSW, LCSW Clinical Social Worker    Expected Discharge Plan: Skilled Nursing Facility Barriers to Discharge: Insurance Authorization               Expected Discharge Plan and Services In-house Referral: Clinical Social Work Discharge Planning Services: CM Consult Post Acute Care Choice: Skilled Nursing Facility Living arrangements for the past 2 months: Single Family Home                                       Social Drivers of Health (SDOH) Interventions SDOH Screenings   Food Insecurity: No Food Insecurity (05/20/2024)  Housing: Low Risk  (05/20/2024)  Transportation Needs: No Transportation Needs (05/20/2024)  Utilities: Not At Risk (05/20/2024)  Alcohol Screen: Low Risk  (03/19/2024)  Depression (PHQ2-9): Medium Risk (03/19/2024)  Financial Resource Strain: Low Risk  (05/14/2024)  Physical Activity: Sufficiently Active (05/14/2024)  Recent Concern: Physical Activity - Insufficiently Active (03/19/2024)  Social Connections: Moderately Integrated (05/20/2024)  Stress: No Stress Concern Present (05/14/2024)  Recent Concern: Stress - Stress Concern Present (03/19/2024)  Tobacco Use: Medium Risk (05/17/2024)  Health Literacy: Adequate Health Literacy (03/19/2024)    Readmission Risk Interventions     No data to display

## 2024-05-23 NOTE — Progress Notes (Signed)
 [address] [employer] [address]             [phone]      6 Days Post-Op Procedure(s) (LRB): CORONARY ARTERY BYPASS GRAFTING (CABG) X 2, USING BILATERAL INTERNAL MAMMARY ARTERIES (N/A) ECHOCARDIOGRAM, TRANSESOPHAGEAL, INTRAOPERATIVE (N/A) Subjective: The patient reports she had a hard dark tarry stool last night. She also reports she wants to go home but then states no one will be there overnight so changed her mind and states she will go to [facility] if she can get a private room.   Objective: Vital signs in last 24 hours: Temp:  [97.8 F (36.6 C)-99.3 F (37.4 C)] 98.1 F (36.7 C) (09/18 0335) Pulse Rate:  [77-99] 86 (09/18 0335) Cardiac Rhythm: Normal sinus rhythm (09/17 2200) Resp:  [13-23] 20 (09/18 0335) BP: (115-176)/(59-79) 123/69 (09/18 0335) SpO2:  [91 %-99 %] 97 % (09/18 0335) Weight:  [88 kg] 88 kg (09/18 0335)  Hemodynamic parameters for last 24 hours:    Intake/Output from previous day: 09/17 0701 - 09/18 0700 In: 0  Out: 2100 [Urine:2100] Intake/Output this shift: No intake/output data recorded.  General appearance: alert, cooperative, and no distress Neurologic: intact Heart: regular rate and rhythm, soft systolic murmur at left sternal border, no rub Lungs: clear to auscultation bilaterally Abdomen: soft, non-tender; bowel sounds normal; no masses,  no organomegaly Extremities: edema trace BLE Wound: Clean and dry without sign of infection  Lab Results: Recent Labs    05/21/24 0346 05/22/24 0339  WBC 5.5 5.8  HGB 7.8* 8.2*  HCT 24.5* 25.8*  PLT 175 241   BMET:  Recent Labs    05/22/24 0339 05/23/24 0326  NA 137 135  K 4.5 4.3  CL 103 95*  CO2 27 26  GLUCOSE 110* 102*  BUN 12 12  CREATININE 1.02* 0.93  CALCIUM  8.6* 8.5*    PT/INR: No results for input(s): LABPROT, INR in the last 72 hours. ABG    Component Value Date/Time   PHART 7.306 (L) 05/17/2024 1818   HCO3 23.3 05/17/2024 1818   TCO2  25 05/17/2024 1818   ACIDBASEDEF 3.0 (H) 05/17/2024 1818   O2SAT 97 05/17/2024 1818   CBG (last 3)  Recent Labs    05/21/24 0606 05/21/24 1128 05/21/24 1609  GLUCAP 117* 113* 106*    Assessment/Plan: S/P Procedure(s) (LRB): CORONARY ARTERY BYPASS GRAFTING (CABG) X 2, USING BILATERAL INTERNAL MAMMARY ARTERIES (N/A) ECHOCARDIOGRAM, TRANSESOPHAGEAL, INTRAOPERATIVE (N/A)  Neuro: Patient has chronic pain on Percocet 10-325mg  Q6H PRN at home. Pain controlled on current regimen. Will restart home meds at discharge.    Bipolar disorder: On home Seroquel , desyrel , Lamictal  and Xanax    CV: Hx of postop afib. 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 A few short bursts of ST up to 107. On PO Amiodarone  400mg  BID and Lopressor  37.5mg  BID. BP slightly elevated, SBP mostly 130s but did get up to 170 last night. May be able to restart low dose home Losartan . Recent stent of Lcx, on Plavix  75mg  and ASA 81mg .    Pulm: Saturating well on RA. Last CXR with minimal left basilar atelectasis and trace pleural effusion. Encourage IS and ambulation.   GI: +BM 09/15, tolerating a diet. Patient reported hard and black tarry stools last night, possibly due to iron supplement but hemoccult was ordered. Reported hard stools as well, previous diarrhea so stool softeners were d/c'd will restart colace and miralax  as taken at home.  Endo: No hx of DM, CBGs and SSI have been d/c'd. Hypothyroid, on home levothyroxine .    Renal: Cr 0.93, improved. UO 2100cc/24hrs recorded. +4lbs from preop weight. Continue PO Lasix  40mg  daily. K 4.3, at goal.    ID: No leukocytosis, Tmax 99.3 clinically monitor.   DVT Prophylaxis: Lovenox , as discussed with Dr. Lucas will d/c since she is ambulating well and on Plavix  and ASA and with reports of dark tarry stool.    Deconditioning: Continue work with PT/OT   Dispo: Patient reports to me that she will go to a [facility] if she can get a private room as she is concerned about COVID. She  reports she will have people come check on her at home but that she would not have anyone there overnight and there would still be a lot of time that she would be alone. Social work to continue working on SNF placement for now.      LOS: 6 days    Con [name], PA-C 05/23/2024

## 2024-05-23 NOTE — Progress Notes (Signed)
 Mobility Specialist Progress Note:   05/23/24 0854  Mobility  Activity Ambulated with assistance  Level of Assistance Standby assist, set-up cues, supervision of patient - no hands on  Assistive Device Four wheel walker  Distance Ambulated (ft) 100 ft (137ft x 2 (seated rest break))  RUE Weight Bearing Per Provider Order NWB  LUE Weight Bearing Per Provider Order NWB  Activity Response Tolerated well  Mobility Referral Yes  Mobility visit 1 Mobility  Mobility Specialist Start Time (ACUTE ONLY) [id number]  Mobility Specialist Stop Time (ACUTE ONLY) 0906  Mobility Specialist Time Calculation (min) (ACUTE ONLY) 12 min   Pt received in bed, agreeable to mobility session. Ambulated with 4WW, required cues to follow sternal precautions. Tolerated well, asx throughout. HR 97 bpm. Returned pt to room, sitting up in chair with all needs met.   Su Duma Mobility Specialist Please contact via Special educational needs teacher or  Rehab office at [phone]

## 2024-05-23 NOTE — Progress Notes (Addendum)
 Occupational Therapy Treatment Patient Details Name: Becky Lawson MRN: [id number] DOB: November 02, 1950 Today's Date: 05/23/2024   History of present illness 73 yo female admitted 9/12 s/p CABG x2. Postop atrial fib converted on IV amio. MD plans to remove pacer wires 9/17 after PT session. 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 Treatment session focused on educating for compensatory strategies for ADLs. Educated pt on UB/LB dressing techniques as well as perineal hygiene technique while maintaining sternal precautions.  Pt able to return demo of education, however still requiring supervision for safety with frequent cues to limit shoulder extension d/t violating sternal precautions. Continue to recommend <3 hours of skilled rehab daily to optimize independence levels. Will continue to follow acutely.       If plan is discharge home, recommend the following:  A little help with walking and/or transfers;A little help with bathing/dressing/bathroom   Equipment Recommendations  Tub/shower bench;BSC/3in1       Precautions / Restrictions Precautions Precautions: Sternal Precaution Booklet Issued: Yes (comment) Recall of Precautions/Restrictions: Intact Restrictions Weight Bearing Restrictions Per Provider Order: No Other Position/Activity Restrictions: Cardiac sternal prec       Mobility Bed Mobility     General bed mobility comments: Received in recliner    Transfers Overall transfer level: Needs assistance Equipment used: None Transfers: Sit to/from Stand Sit to Stand: Supervision           General transfer comment: S for safety, good use of momentum to stand     Balance Overall balance assessment: Mild deficits observed, not formally tested       ADL either performed or assessed with clinical judgement   ADL Overall ADL's : Needs assistance/impaired     Upper Body Dressing : Supervision/safety;Cueing for UE  precautions;Cueing for compensatory techniques   Lower Body Dressing: Supervision/safety;Sit to/from stand;Cueing for compensatory techniques       Toileting- Clothing Manipulation and Hygiene: Supervision/safety;Sit to/from stand;Cueing for compensatory techniques       Functional mobility during ADLs: Supervision/safety General ADL Comments: Cueing for compensatory techniques for ADLs with sternal precautions    Extremity/Trunk Assessment Upper Extremity Assessment Upper Extremity Assessment: Overall WFL for tasks assessed   Lower Extremity Assessment Lower Extremity Assessment: Defer to PT evaluation        Vision   Vision Assessment?: No apparent visual deficits         Communication Communication Communication: No apparent difficulties   Cognition Arousal: Alert Behavior During Therapy: WFL for tasks assessed/performed Cognition: No apparent impairments         Following commands: Intact        Cueing   Cueing Techniques: Verbal cues, Gestural cues, Visual cues        General Comments Pt reporting mild lightheadedness BP stable 124/77    Pertinent Vitals/ Pain       Pain Assessment Pain Assessment: No/denies pain Pain Intervention(s): Monitored during session   Frequency  Min 1X/week        Progress Toward Goals  OT Goals(current goals can now be found in the care plan section)  Progress towards OT goals: Progressing toward goals  Acute Rehab OT Goals Patient Stated Goal: To go to rehab OT Goal Formulation: With patient Time For Goal Achievement: 06/04/24 Potential to Achieve Goals: Good ADL Goals Pt Will Perform Lower Body Dressing: with modified independence;with adaptive equipment;sit to/from stand Pt Will Transfer to Toilet: with modified independence;ambulating Pt Will Perform  Toileting - Clothing Manipulation and hygiene: with modified independence;sit to/from stand;with adaptive equipment  Plan         AM-PAC OT 6 Clicks  Daily Activity     Outcome Measure   Help from another person eating meals?: None Help from another person taking care of personal grooming?: None Help from another person toileting, which includes using toliet, bedpan, or urinal?: A Little Help from another person bathing (including washing, rinsing, drying)?: A Little Help from another person to put on and taking off regular upper body clothing?: A Little Help from another person to put on and taking off regular lower body clothing?: A Little 6 Click Score: 20    End of Session Equipment Utilized During Treatment: Gait belt  OT Visit Diagnosis: Unsteadiness on feet (R26.81);Muscle weakness (generalized) (M62.81)   Activity Tolerance Patient tolerated treatment well   Patient Left in chair;with call bell/phone within reach   Nurse Communication Mobility status;Precautions        Time: 8677-8662 OT Time Calculation (min): 15 min  Charges: OT General Charges $OT Visit: 1 Visit OT Treatments $Self Care/Home Management : 8-22 mins  Adrianne BROCKS, OT  Acute Rehabilitation Services Office [phone] Secure chat preferred   Adrianne GORMAN [employer]vers 05/23/2024, 1:52 PM

## 2024-05-23 NOTE — Plan of Care (Signed)
   Problem: Health Behavior/Discharge Planning: Goal: Ability to manage health-related needs will improve Outcome: Progressing   Problem: Clinical Measurements: Goal: Ability to maintain clinical measurements within normal limits will improve Outcome: Progressing

## 2024-05-23 NOTE — Progress Notes (Signed)
 Patient requested not to be disturbed until 530 am

## 2024-05-24 ENCOUNTER — Ambulatory Visit: Admitting: Cardiology

## 2024-05-24 LAB — BASIC METABOLIC PANEL WITH GFR
Anion gap: 12 (ref 5–15)
BUN: 10 mg/dL (ref 8–23)
CO2: 26 mmol/L (ref 22–32)
Calcium: 8.6 mg/dL — ABNORMAL LOW (ref 8.9–10.3)
Chloride: 99 mmol/L (ref 98–111)
Creatinine, Ser: 1.19 mg/dL — ABNORMAL HIGH (ref 0.44–1.00)
GFR, Estimated: 48 mL/min — ABNORMAL LOW (ref >60)
Glucose, Bld: 118 mg/dL — ABNORMAL HIGH (ref 70–99)
Potassium: 4.2 mmol/L (ref 3.5–5.1)
Sodium: 137 mmol/L (ref 135–145)

## 2024-05-24 LAB — GLUCOSE, CAPILLARY
Glucose-Capillary: 112 mg/dL — ABNORMAL HIGH (ref 70–99)
Glucose-Capillary: 105 mg/dL — ABNORMAL HIGH (ref 70–99)

## 2024-05-24 MED ORDER — POTASSIUM CHLORIDE CRYS ER 20 MEQ PO TBCR: 20.0000 meq | EXTENDED_RELEASE_TABLET | Freq: Every day | ORAL | Status: DC

## 2024-05-24 MED ORDER — ALPRAZOLAM 1 MG PO TABS: ORAL_TABLET | ORAL | 0 refills | Status: AC

## 2024-05-24 MED ORDER — AMIODARONE HCL 200 MG PO TABS: ORAL_TABLET | ORAL | Status: DC

## 2024-05-24 MED ORDER — FE FUM-VIT C-VIT B12-FA 460-60-0.01-1 MG PO CAPS: 1.0000 | ORAL_CAPSULE | Freq: Every day | ORAL | Status: AC

## 2024-05-24 MED ORDER — OXYCODONE-ACETAMINOPHEN 10-325 MG PO TABS: 1.0000 | ORAL_TABLET | Freq: Four times a day (QID) | ORAL | 0 refills | Status: AC | PRN

## 2024-05-24 MED ORDER — METOPROLOL TARTRATE 50 MG PO TABS: 50.0000 mg | ORAL_TABLET | Freq: Two times a day (BID) | ORAL | Status: DC

## 2024-05-24 MED ORDER — FUROSEMIDE 40 MG PO TABS: 40.0000 mg | ORAL_TABLET | Freq: Every day | ORAL | Status: DC

## 2024-05-24 NOTE — Progress Notes (Signed)
 Mobility Specialist Progress Note:   05/24/24 0928  Mobility  Activity Ambulated with assistance  Level of Assistance Standby assist, set-up cues, supervision of patient - no hands on  Assistive Device Four wheel walker  Distance Ambulated (ft) 100 ft ((139ft x 2 ) w sitting RB)  RUE Weight Bearing Per Provider Order NWB  LUE Weight Bearing Per Provider Order NWB  Activity Response Tolerated well  Mobility Referral Yes  Mobility visit 1 Mobility  Mobility Specialist Start Time (ACUTE ONLY) [id number]  Mobility Specialist Stop Time (ACUTE ONLY) 0936  Mobility Specialist Time Calculation (min) (ACUTE ONLY) 8 min   Pt received in bed, eager for mobility session. Ambulated in hallway. Required 1 seated rest break with 4WW. Tolerated well, HR 89 bpm. Returned pt to room, left with all needs met.    Becky Lawson Mobility Specialist Please contact via Special educational needs teacher or  Rehab office at [phone]

## 2024-05-24 NOTE — Progress Notes (Addendum)
 Physical Therapy Treatment Patient Details Name: Becky Lawson MRN: [id number] DOB: 14-Jul-1951 Today's Date: 05/24/2024   History of Present Illness 73 yo female admitted 9/12 s/p CABG x2. Postop atrial fib converted on IV amio. MD plans to remove pacer wires 9/17 after PT session. 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 Pt needing more frequent seated breaks and c/o subjective dyspnea 3/4 and overall weakness after short household distance gait trials, although VSS sitting/standing, RN notified pt c/o increased work of breathing today. Pt neeidng up to minA with dense cues for stair negotiation and with decreased safety awareness as she fatigues, needing constant reminders for rollator brake use and not to abandon rollator when navigating around room, as she has been c/o dizziness/fatigue and is at increase risk of falls. Patient will benefit from continued inpatient follow up therapy, <3 hours/day, pt remains a high fall risk and does not have enough support/assist at home at current level.  Vital Signs  Patient Position (if appropriate) Orthostatic Vitals  Orthostatic Sitting  BP- Sitting 117/60 (after c/o weakness)  Pulse- Sitting 69  Orthostatic Standing at 0 minutes  BP- Standing at 0 minutes 114/56  Pulse- Standing at 0 minutes 73  Orthostatic Standing at 3 minutes  BP- Standing at 3 minutes 118/60  Pulse- Standing at 3 minutes 77     If plan is discharge home, recommend the following: A little help with walking and/or transfers;A little help with bathing/dressing/bathroom;Assistance with cooking/housework;Assist for transportation;Help with stairs or ramp for entrance   Can travel by private vehicle     Yes  Equipment Recommendations  Rollator (4 wheels)    Recommendations for Other Services       Precautions / Restrictions  Precautions Precautions: Sternal Precaution Booklet Issued: Yes (comment) Recall of Precautions/Restrictions: Impaired Precaution/Restrictions Comments: sternal precautions, needs reminders during ftnl tasks 50% still. impulsive to abandon rollator/not remembering to use brakes 50% of the time. Restrictions Weight Bearing Restrictions Per Provider Order: No Other Position/Activity Restrictions: Cardiac sternal prec     Mobility  Bed Mobility               General bed mobility comments: Received in recliner, visual/verbal demo for sternal precs when getting back to bed, pt sitting EOB to eat lunch at end of session    Transfers Overall transfer level: Needs assistance Equipment used: None, Rollator (4 wheels) Transfers: Sit to/from Stand Sit to Stand: Supervision, Contact guard assist           General transfer comment: Pt forgets to lock rollator before sitting when more fatigued/short of breath; needs reminders and often CGA for safety    Ambulation/Gait Ambulation/Gait assistance: Contact guard assist Gait Distance (Feet): 30 Feet (x2 with seated breaks using RW, then ~90ft x2 with no AD) Assistive device: Rollator (4 wheels), None Gait Pattern/deviations: Decreased stride length, Trunk flexed, Drifts right/left       General Gait Details: Cues for upright posture, limiting WB through UEs to maintain sternal precautions in vertical position. No buckling. SpO2 WFL on RA, HR 70's bpm. c/o dizziness/increased WOB while VSS, BP checked sitting to standing and standing after ~5 mins and all readings stable. Needs reminders not to abandon rollator, pt CGA without AD and furniture walking and CGA to supervision with rollator   Stairs Stairs: Yes Stairs assistance: Min assist Stair Management: One  rail Right, Step to pattern, Backwards, Forwards Number of Stairs: 3 General stair comments: 7 platform step in hallway wtih R rail, HHA on opposite side, cues for sternal  precs and step sequencing/safety. Step-to pattern, pt able to ascend with each leg, reports L leg is more comfortable today. Fatigue/dypsneic after third step so returned to sitting after standing BP checked.   Wheelchair Mobility     Tilt Bed    Modified Rankin (Stroke Patients Only)       Balance Overall balance assessment: Mild deficits observed, not formally tested                                          Communication Communication Communication: No apparent difficulties  Cognition Arousal: Alert Behavior During Therapy: WFL for tasks assessed/performed, Impulsive   PT - Cognitive impairments: Safety/Judgement                       PT - Cognition Comments: poor compliance with rollator brakes, c/o weakness today but abandons rollator despite multiple times needing abrupt seated breaks Following commands: Intact      Cueing Cueing Techniques: Verbal cues, Gestural cues, Visual cues  Exercises      General Comments        Pertinent Vitals/Pain Pain Assessment Pain Assessment: Faces Faces Pain Scale: Hurts a little bit Pain Location: no specific c/o Pain Descriptors / Indicators: Pressure Pain Intervention(s): Limited activity within patient's tolerance, Monitored during session, Repositioned (cues for pursed-lip breathing due to c/o shortness of breath/heaviness of breathing with exertion)    Home Living                          Prior Function            PT Goals (current goals can now be found in the care plan section) Acute Rehab PT Goals Patient Stated Goal: Get rehab before going home alone PT Goal Formulation: With patient Time For Goal Achievement: 06/04/24 Progress towards PT goals: Progressing toward goals    Frequency    Min 2X/week      PT Plan      Co-evaluation              AM-PAC PT 6 Clicks Mobility   Outcome Measure  Help needed turning from your back to your side while in a flat  bed without using bedrails?: A Little Help needed moving from lying on your back to sitting on the side of a flat bed without using bedrails?: A Lot (mod cues) Help needed moving to and from a bed to a chair (including a wheelchair)?: A Little Help needed standing up from a chair using your arms (e.g., wheelchair or bedside chair)?: A Little Help needed to walk in hospital room?: A Little Help needed climbing 3-5 steps with a railing? : A Lot (mod cues) 6 Click Score: 16    End of Session Equipment Utilized During Treatment: Gait belt Activity Tolerance: Patient limited by fatigue;Other (comment) (c/o dyspnea/weakness with activity although VSS) Patient left: in bed;with call bell/phone within reach;with bed alarm set;Other (comment) (sitting EOB to eat lunch, NT arriving) Nurse Communication: Mobility status;Other (comment) (dyspnea) PT Visit Diagnosis: Unsteadiness on feet (R26.81);Other abnormalities of gait and mobility (R26.89);Muscle weakness (generalized) (M62.81);Difficulty in walking, not elsewhere classified (R26.2);Pain     Time: 8849-8788  PT Time Calculation (min) (ACUTE ONLY): 21 min  Charges:    $Gait Training: 8-22 mins PT General Charges $$ ACUTE PT VISIT: 1 Visit                     Koby Pickup P., PTA Acute Rehabilitation Services Secure Chat Preferred 9a-5:30pm Office: [phone]    Connell HERO [facility] 05/24/2024, 12:49 PM

## 2024-05-24 NOTE — TOC Progression Note (Signed)
 Transition of Care [facility]) - Progression Note    Patient Details  Name: Becky Lawson MRN: [id number] Date of Birth: 04-30-1951  [facility]) CM/SW Contact  Whitfield Campanile, Student-Social Work Phone Number: 05/24/2024, 12:43 PM  Clinical Narrative:     CSW presented at bedside to update patient about medical transportation being denied by insurance. Patient stated her family can transport her to SNF at time of discharge.  MSW presented back at patient's bedside to update about [facility] offering bed for tomorrow. Patient and Consulting civil engineer were updated on patient's discharge for tomorrow to [place] and patient's family will transport to SNF.    Whitfield Campanile, MSW Intern  Expected Discharge Plan: Skilled Nursing Facility Barriers to Discharge:  (SNF will nto have bed until tomorrow)               Expected Discharge Plan and Services In-house Referral: Clinical Social Work Discharge Planning Services: CM Consult Post Acute Care Choice: Skilled Nursing Facility Living arrangements for the past 2 months: Single Family Home                                       Social Drivers of Health (SDOH) Interventions SDOH Screenings   Food Insecurity: No Food Insecurity (05/20/2024)  Housing: Low Risk  (05/20/2024)  Transportation Needs: No Transportation Needs (05/20/2024)  Utilities: Not At Risk (05/20/2024)  Alcohol Screen: Low Risk  (03/19/2024)  Depression (PHQ2-9): Medium Risk (03/19/2024)  Financial Resource Strain: Low Risk  (05/14/2024)  Physical Activity: Sufficiently Active (05/14/2024)  Recent Concern: Physical Activity - Insufficiently Active (03/19/2024)  Social Connections: Moderately Integrated (05/20/2024)  Stress: No Stress Concern Present (05/14/2024)  Recent Concern: Stress - Stress Concern Present (03/19/2024)  Tobacco Use: Medium Risk (05/17/2024)  Health Literacy: Adequate Health Literacy (03/19/2024)    Readmission Risk Interventions     No data to  display

## 2024-05-24 NOTE — TOC Progression Note (Addendum)
 Transition of Care [facility]) - Progression Note    Patient Details  Name: Becky Lawson MRN: [id number] Date of Birth: 1951/01/04  Transition of Care [facility]) CM/SW Contact  Montie [name], [place] Phone Number: 05/24/2024, 10:26 AM  Clinical Narrative:     9:05 am- informed PA in progression-  insurance approved insurance but declined PTAR  9:45 am CSW met with patient - informed patient insurance approved for SNF/[facility] approval # [id number] but declined PTAR. Patient states her family can provide transport.   Contacted [facility] - SNF/ [facility] informed, they will have bed tomorrow.   Montie Louder, MSW, LCSW Clinical Social Worker    Expected Discharge Plan: Skilled Nursing Facility Barriers to Discharge:  (SNF will nto have bed until tomorrow)               Expected Discharge Plan and Services In-house Referral: Clinical Social Work Discharge Planning Services: CM Consult Post Acute Care Choice: Skilled Nursing Facility Living arrangements for the past 2 months: Single Family Home                                       Social Drivers of Health (SDOH) Interventions SDOH Screenings   Food Insecurity: No Food Insecurity (05/20/2024)  Housing: Low Risk  (05/20/2024)  Transportation Needs: No Transportation Needs (05/20/2024)  Utilities: Not At Risk (05/20/2024)  Alcohol Screen: Low Risk  (03/19/2024)  Depression (PHQ2-9): Medium Risk (03/19/2024)  Financial Resource Strain: Low Risk  (05/14/2024)  Physical Activity: Sufficiently Active (05/14/2024)  Recent Concern: Physical Activity - Insufficiently Active (03/19/2024)  Social Connections: Moderately Integrated (05/20/2024)  Stress: No Stress Concern Present (05/14/2024)  Recent Concern: Stress - Stress Concern Present (03/19/2024)  Tobacco Use: Medium Risk (05/17/2024)  Health Literacy: Adequate Health Literacy (03/19/2024)    Readmission Risk Interventions     No data to display

## 2024-05-24 NOTE — TOC Progression Note (Signed)
 Transition of Care [facility]) - Progression Note    Patient Details  Name: Becky Lawson MRN: [id number] Date of Birth: 1951/06/28  Transition of Care [facility]) CM/SW Contact  Montie [name], [place] Phone Number: 05/24/2024, 10:29 AM  Clinical Narrative:     Received message from patient's insurance- SNF approved [phone]  PTAR declined - P2P was offered for transport w/ Dr Janit .  Montie Louder, MSW, LCSW Clinical Social Worker    Expected Discharge Plan: Skilled Nursing Facility Barriers to Discharge:  (SNF will nto have bed until tomorrow)               Expected Discharge Plan and Services In-house Referral: Clinical Social Work Discharge Planning Services: CM Consult Post Acute Care Choice: Skilled Nursing Facility Living arrangements for the past 2 months: Single Family Home                                       Social Drivers of Health (SDOH) Interventions SDOH Screenings   Food Insecurity: No Food Insecurity (05/20/2024)  Housing: Low Risk  (05/20/2024)  Transportation Needs: No Transportation Needs (05/20/2024)  Utilities: Not At Risk (05/20/2024)  Alcohol Screen: Low Risk  (03/19/2024)  Depression (PHQ2-9): Medium Risk (03/19/2024)  Financial Resource Strain: Low Risk  (05/14/2024)  Physical Activity: Sufficiently Active (05/14/2024)  Recent Concern: Physical Activity - Insufficiently Active (03/19/2024)  Social Connections: Moderately Integrated (05/20/2024)  Stress: No Stress Concern Present (05/14/2024)  Recent Concern: Stress - Stress Concern Present (03/19/2024)  Tobacco Use: Medium Risk (05/17/2024)  Health Literacy: Adequate Health Literacy (03/19/2024)    Readmission Risk Interventions     No data to display

## 2024-05-24 NOTE — TOC Progression Note (Signed)
 Transition of Care [facility]) - Progression Note    Patient Details  Name: Becky Lawson MRN: [id number] Date of Birth: 1951-06-18  Transition of Care [facility]) CM/SW Contact  Montie [name], [place] Phone Number: 05/24/2024, 4:39 PM  Clinical Narrative:     CSW Intern,  provided  patient with fax of insurance approval for SNF  and reason for denial of PTAR.  Montie Louder, MSW, LCSW Clinical Social Worker    Expected Discharge Plan: Skilled Nursing Facility Barriers to Discharge:  (SNF will nto have bed until tomorrow)               Expected Discharge Plan and Services In-house Referral: Clinical Social Work Discharge Planning Services: CM Consult Post Acute Care Choice: Skilled Nursing Facility Living arrangements for the past 2 months: Single Family Home                                       Social Drivers of Health (SDOH) Interventions SDOH Screenings   Food Insecurity: No Food Insecurity (05/20/2024)  Housing: Low Risk  (05/20/2024)  Transportation Needs: No Transportation Needs (05/20/2024)  Utilities: Not At Risk (05/20/2024)  Alcohol Screen: Low Risk  (03/19/2024)  Depression (PHQ2-9): Medium Risk (03/19/2024)  Financial Resource Strain: Low Risk  (05/14/2024)  Physical Activity: Sufficiently Active (05/14/2024)  Recent Concern: Physical Activity - Insufficiently Active (03/19/2024)  Social Connections: Moderately Integrated (05/20/2024)  Stress: No Stress Concern Present (05/14/2024)  Recent Concern: Stress - Stress Concern Present (03/19/2024)  Tobacco Use: Medium Risk (05/17/2024)  Health Literacy: Adequate Health Literacy (03/19/2024)    Readmission Risk Interventions     No data to display

## 2024-05-24 NOTE — Progress Notes (Addendum)
 [address] [place] [address]             [phone]      7 Days Post-Op Procedure(s) (LRB): CORONARY ARTERY BYPASS GRAFTING (CABG) X 2, USING BILATERAL INTERNAL MAMMARY ARTERIES (N/A) ECHOCARDIOGRAM, TRANSESOPHAGEAL, INTRAOPERATIVE (N/A) Subjective: Patient lying in bed states she feels good but was more tired yesterday. She reports continued dark stools but no longer hard.   Objective: Vital signs in last 24 hours: Temp:  [98.4 F (36.9 C)-98.9 F (37.2 C)] 98.5 F (36.9 C) (09/18 2308) Pulse Rate:  [66-96] 66 (09/19 0542) Cardiac Rhythm: Normal sinus rhythm (09/19 0415) Resp:  [18-24] 20 (09/19 0542) BP: (106-155)/(51-71) 111/62 (09/19 0542) SpO2:  [93 %-99 %] 95 % (09/19 0542) Weight:  [87.5 kg] 87.5 kg (09/19 0435)  Hemodynamic parameters for last 24 hours:    Intake/Output from previous day: 09/18 0701 - 09/19 0700 In: 960 [P.O.:960] Out: -  Intake/Output this shift: No intake/output data recorded.  General appearance: alert, cooperative, and no distress Neurologic: intact Heart: regular rate and rhythm, S1, S2 normal, no murmur, click, rub or gallop Lungs: clear to auscultation bilaterally Abdomen: soft, non-tender; bowel sounds normal; no masses,  no organomegaly Extremities: edema trace BLE Wound: Clean and dry without sign of infection, sternal binder in place  Lab Results: Recent Labs    05/22/24 0339  WBC 5.8  HGB 8.2*  HCT 25.8*  PLT 241   BMET:  Recent Labs    05/22/24 0339 05/23/24 0326  NA 137 135  K 4.5 4.3  CL 103 95*  CO2 27 26  GLUCOSE 110* 102*  BUN 12 12  CREATININE 1.02* 0.93  CALCIUM  8.6* 8.5*    PT/INR: No results for input(s): LABPROT, INR in the last 72 hours. ABG    Component Value Date/Time   PHART 7.306 (L) 05/17/2024 1818   HCO3 23.3 05/17/2024 1818   TCO2 25 05/17/2024 1818   ACIDBASEDEF 3.0 (H) 05/17/2024 1818   O2SAT 97 05/17/2024 1818   CBG (last 3)  Recent Labs     05/21/24 1128 05/21/24 1609  GLUCAP 113* 106*    Assessment/Plan: S/P Procedure(s) (LRB): CORONARY ARTERY BYPASS GRAFTING (CABG) X 2, USING BILATERAL INTERNAL MAMMARY ARTERIES (N/A) ECHOCARDIOGRAM, TRANSESOPHAGEAL, INTRAOPERATIVE (N/A)  Neuro: Patient has chronic pain on Percocet 10-325mg  Q6H PRN at home. Pain controlled on current regimen. Will restart home meds at discharge.    Bipolar disorder: On home Seroquel , desyrel , Lamictal  and Xanax    CV: Hx of postop afib. NSR, HR 60s-80s. No further sinus tachycardia bursts. On PO Amiodarone  400mg  BID and Lopressor  50mg  BID. BP well controlled, SBP mostly 110-130s. Recent stent of Lcx, on Plavix  75mg  and ASA 81mg .    Pulm: Saturating well on RA. Last CXR with minimal left basilar atelectasis and trace pleural effusion. Encourage IS and ambulation.   GI: +BM 09/18, tolerating a diet. Patient black tarry stool, hemoccult negative. Likely discolored due to iron supplement. Patient reports stool is not as hard but she still not normal and does not want to strain. Continue colace and miralax  as taken at home.    Endo: Hypothyroid, on home levothyroxine .    Renal: Last Cr 0.93, improved. No UO recorded. +3lbs from preop weight. Continue PO Lasix  40mg  daily. Last K 4.3, at goal.    ID: No leukocytosis, Tmax 99.3 clinically monitor.    DVT Prophylaxis: D/C'd Lovenox  since she is ambulating well and  on Plavix  and ASA and with report of dark tarry stool.    Deconditioning: Continue work with PT/OT   Dispo: Plan for SNF admission to [facility], TOC working on English as a second language teacher. Hopefully can d/c today if they have a bed available.    LOS: 7 days    Con [name], PA-C 05/24/2024   Chart reviewed, patient examined, agree with above. She looks good overall. Plan SNF pending insurance authorization.

## 2024-05-24 NOTE — Care Management Important Message (Signed)
 Important Message  Patient Details  Name: Becky Lawson MRN: [id number] Date of Birth: 1951-02-02   Important Message Given:  Yes - Medicare IM     [name] 05/24/2024, 11:50 AM

## 2024-05-24 NOTE — Progress Notes (Signed)
 Pt IV occluded, removed pt refused new IV explained to pt the reason for and need for PIV, pt still refuses

## 2024-05-24 NOTE — Plan of Care (Signed)

## 2024-05-25 DIAGNOSIS — M6281 Muscle weakness (generalized): Secondary | ICD-10-CM | POA: Diagnosis not present

## 2024-05-25 DIAGNOSIS — R251 Tremor, unspecified: Secondary | ICD-10-CM | POA: Diagnosis not present

## 2024-05-25 DIAGNOSIS — G4089 Other seizures: Secondary | ICD-10-CM | POA: Diagnosis not present

## 2024-05-25 DIAGNOSIS — Z85850 Personal history of malignant neoplasm of thyroid: Secondary | ICD-10-CM | POA: Diagnosis not present

## 2024-05-25 DIAGNOSIS — K760 Fatty (change of) liver, not elsewhere classified: Secondary | ICD-10-CM | POA: Diagnosis not present

## 2024-05-25 DIAGNOSIS — R7989 Other specified abnormal findings of blood chemistry: Secondary | ICD-10-CM | POA: Diagnosis not present

## 2024-05-25 DIAGNOSIS — R931 Abnormal findings on diagnostic imaging of heart and coronary circulation: Secondary | ICD-10-CM | POA: Diagnosis not present

## 2024-05-25 DIAGNOSIS — R7303 Prediabetes: Secondary | ICD-10-CM | POA: Diagnosis not present

## 2024-05-25 DIAGNOSIS — I499 Cardiac arrhythmia, unspecified: Secondary | ICD-10-CM | POA: Diagnosis not present

## 2024-05-25 DIAGNOSIS — N898 Other specified noninflammatory disorders of vagina: Secondary | ICD-10-CM | POA: Diagnosis not present

## 2024-05-25 DIAGNOSIS — Z79891 Long term (current) use of opiate analgesic: Secondary | ICD-10-CM | POA: Diagnosis not present

## 2024-05-25 DIAGNOSIS — Z951 Presence of aortocoronary bypass graft: Secondary | ICD-10-CM | POA: Diagnosis not present

## 2024-05-25 DIAGNOSIS — Z96641 Presence of right artificial hip joint: Secondary | ICD-10-CM | POA: Diagnosis not present

## 2024-05-25 DIAGNOSIS — M5416 Radiculopathy, lumbar region: Secondary | ICD-10-CM | POA: Diagnosis not present

## 2024-05-25 DIAGNOSIS — E559 Vitamin D deficiency, unspecified: Secondary | ICD-10-CM | POA: Diagnosis not present

## 2024-05-25 DIAGNOSIS — F319 Bipolar disorder, unspecified: Secondary | ICD-10-CM | POA: Diagnosis not present

## 2024-05-25 DIAGNOSIS — Z96642 Presence of left artificial hip joint: Secondary | ICD-10-CM | POA: Diagnosis not present

## 2024-05-25 DIAGNOSIS — S92353A Displaced fracture of fifth metatarsal bone, unspecified foot, initial encounter for closed fracture: Secondary | ICD-10-CM | POA: Diagnosis not present

## 2024-05-25 DIAGNOSIS — C73 Malignant neoplasm of thyroid gland: Secondary | ICD-10-CM | POA: Diagnosis not present

## 2024-05-25 DIAGNOSIS — Z79899 Other long term (current) drug therapy: Secondary | ICD-10-CM | POA: Diagnosis not present

## 2024-05-25 DIAGNOSIS — Z9861 Coronary angioplasty status: Secondary | ICD-10-CM | POA: Diagnosis not present

## 2024-05-25 DIAGNOSIS — E890 Postprocedural hypothyroidism: Secondary | ICD-10-CM | POA: Diagnosis not present

## 2024-05-25 DIAGNOSIS — I5189 Other ill-defined heart diseases: Secondary | ICD-10-CM | POA: Diagnosis not present

## 2024-05-25 DIAGNOSIS — R262 Difficulty in walking, not elsewhere classified: Secondary | ICD-10-CM | POA: Diagnosis not present

## 2024-05-25 DIAGNOSIS — E785 Hyperlipidemia, unspecified: Secondary | ICD-10-CM | POA: Diagnosis not present

## 2024-05-25 DIAGNOSIS — M25551 Pain in right hip: Secondary | ICD-10-CM | POA: Diagnosis not present

## 2024-05-25 DIAGNOSIS — M51369 Other intervertebral disc degeneration, lumbar region without mention of lumbar back pain or lower extremity pain: Secondary | ICD-10-CM | POA: Diagnosis not present

## 2024-05-25 DIAGNOSIS — E039 Hypothyroidism, unspecified: Secondary | ICD-10-CM | POA: Diagnosis not present

## 2024-05-25 DIAGNOSIS — R41841 Cognitive communication deficit: Secondary | ICD-10-CM | POA: Diagnosis not present

## 2024-05-25 NOTE — Progress Notes (Signed)
 8 Days Post-Op Procedure(s) (LRB): CORONARY ARTERY BYPASS GRAFTING (CABG) X 2, USING BILATERAL INTERNAL MAMMARY ARTERIES (N/A) ECHOCARDIOGRAM, TRANSESOPHAGEAL, INTRAOPERATIVE (N/A) Subjective: Feels well  Objective: Vital signs in last 24 hours: Temp:  [98.1 F (36.7 C)-99 F (37.2 C)] 98.1 F (36.7 C) (09/20 0552) Pulse Rate:  [69-75] 73 (09/20 0552) Cardiac Rhythm: Normal sinus rhythm (09/19 1927) Resp:  [16-20] 16 (09/20 0552) BP: (114-135)/(49-67) 123/67 (09/20 0552) SpO2:  [93 %-96 %] 94 % (09/20 0552) Weight:  [86.3 kg] 86.3 kg (09/20 0547)  Hemodynamic parameters for last 24 hours:    Intake/Output from previous day: 09/19 0701 - 09/20 0700 In: 480 [P.O.:480] Out: -  Intake/Output this shift: No intake/output data recorded.  General appearance: alert, cooperative, and no distress Heart: regular rate and rhythm Lungs: clear to auscultation bilaterally Abdomen: benign Extremities: no edema Wound: incis healing well  Lab Results: No results for input(s): WBC, HGB, HCT, PLT in the last 72 hours. BMET:  Recent Labs    05/23/24 0326 05/24/24 0623  NA 135 137  K 4.3 4.2  CL 95* 99  CO2 26 26  GLUCOSE 102* 118*  BUN 12 10  CREATININE 0.93 1.19*  CALCIUM  8.5* 8.6*    PT/INR: No results for input(s): LABPROT, INR in the last 72 hours. ABG    Component Value Date/Time   PHART 7.306 (L) 05/17/2024 1818   HCO3 23.3 05/17/2024 1818   TCO2 25 05/17/2024 1818   ACIDBASEDEF 3.0 (H) 05/17/2024 1818   O2SAT 97 05/17/2024 1818   CBG (last 3)  Recent Labs    05/24/24 1213 05/24/24 1715  GLUCAP 112* 105*    Meds Scheduled Meds:  ALPRAZolam   1 mg Oral QHS   amiodarone   400 mg Oral BID   aspirin  EC  81 mg Oral Daily   atorvastatin   80 mg Oral Daily   clopidogrel   75 mg Oral Q breakfast   docusate sodium   100 mg Oral QHS   Fe Fum-Vit C-Vit B12-FA  1 capsule Oral QPC breakfast   furosemide   40 mg Oral Daily   lamoTRIgine   100 mg Oral q1800    lamoTRIgine   200 mg Oral q AM   levothyroxine   100 mcg Oral QAC breakfast   metoprolol  tartrate  50 mg Oral BID   pantoprazole   40 mg Oral Daily   polyethylene glycol  17 g Oral Daily   potassium chloride   20 mEq Oral Daily   QUEtiapine   50 mg Oral QHS   traZODone   150 mg Oral QHS   Continuous Infusions: PRN Meds:.ondansetron  (ZOFRAN ) IV, oxyCODONE , traMADol   Xrays No results found.  Assessment/Plan: S/P Procedure(s) (LRB): CORONARY ARTERY BYPASS GRAFTING (CABG) X 2, USING BILATERAL INTERNAL MAMMARY ARTERIES (N/A) ECHOCARDIOGRAM, TRANSESOPHAGEAL, INTRAOPERATIVE (N/A) POD#8  1 afeb, VSS, sinus rhythm 2 O2 sats good on RA 3 weight = preop, voiding not measured, will stop diuretics in 2 days 4 no new labs or xrays 5 appears stable for tx to SNF , reviewed instructions   LOS: 8 days    Lemond FORBES Cera PA-C Pager [phone] 05/25/2024

## 2024-05-25 NOTE — TOC Transition Note (Addendum)
 Transition of Care [facility]) - Discharge Note   Patient Details  Name: Becky Lawson MRN: [id number] Date of Birth: May 01, 1951  [facility] of [facility]) CM/SW Contact:  Montie [name], LCSW Phone Number: 05/25/2024, 12:29 PM   Clinical Narrative:     Patient will Discharge to: [facility]  Discharge Date: 05/25/24 Family Notified: daughter Transport By: Family @ 2pm  Per MD patient is ready for discharge. RN, patient, and facility notified of discharge. Discharge Summary sent to facility. RN given number for report to [facility]ine [phone], Room 3247.   Clinical Social Worker signing off.  Montie Louder, MSW, LCSW Clinical Social Worker     Final next level of care: Skilled Nursing Facility Barriers to Discharge: Barriers Resolved   Patient Goals and CMS Choice Patient states their goals for this hospitalization and ongoing recovery are:: wants to get stronger before returning home CMS Medicare.gov Compare Post Acute Care list provided to:: Patient Choice offered to / list presented to : Patient      Discharge Placement              Patient chooses bed at:  ([facility]) Patient to be transferred to facility by: family Name of family member notified: daughter Patient and family notified of of transfer: 05/25/24  Discharge Plan and Services Additional resources added to the After Visit Summary for   In-house Referral: Clinical Social Work Discharge Planning Services: CM Consult Post Acute Care Choice: Skilled Nursing Facility                               Social Drivers of Health (SDOH) Interventions SDOH Screenings   Food Insecurity: No Food Insecurity (05/20/2024)  Housing: Low Risk  (05/20/2024)  Transportation Needs: No Transportation Needs (05/20/2024)  Utilities: Not At Risk (05/20/2024)  Alcohol Screen: Low Risk  (03/19/2024)  Depression (PHQ2-9): Medium Risk (03/19/2024)  Financial Resource Strain: Low Risk  (05/14/2024)  Physical Activity:  Sufficiently Active (05/14/2024)  Recent Concern: Physical Activity - Insufficiently Active (03/19/2024)  Social Connections: Moderately Integrated (05/20/2024)  Stress: No Stress Concern Present (05/14/2024)  Recent Concern: Stress - Stress Concern Present (03/19/2024)  Tobacco Use: Medium Risk (05/17/2024)  Health Literacy: Adequate Health Literacy (03/19/2024)     Readmission Risk Interventions     No data to display

## 2024-05-25 NOTE — Progress Notes (Signed)
 Called [facility] at [phone] to give report with no answer. Will attempt again.

## 2024-05-25 NOTE — Progress Notes (Addendum)
 DISCHARGE NOTE HOME Becky Lawson to be discharged Rehab at [facility].  per MD order. Discussed prescriptions and follow up appointments with the patient. Prescriptions given to patient; medication list explained in detail. Patient verbalized understanding.  Skin clean, dry and intact without evidence of skin break down, no evidence of skin tears noted. IV catheter discontinued intact. Site without signs and symptoms of complications. Dressing and pressure applied. Pt denies pain at the site currently. No complaints noted.  See LDA for incisions and wounds   AFTERter Visit Summary (AVS) was printed and given to the patient. Patient escorted via wheelchair, and discharged to facility via private auto.  Peyton SHAUNNA Pepper, RN

## 2024-05-27 DIAGNOSIS — N898 Other specified noninflammatory disorders of vagina: Secondary | ICD-10-CM | POA: Diagnosis not present

## 2024-05-27 DIAGNOSIS — Z951 Presence of aortocoronary bypass graft: Secondary | ICD-10-CM | POA: Diagnosis not present

## 2024-05-27 DIAGNOSIS — Z96641 Presence of right artificial hip joint: Secondary | ICD-10-CM | POA: Diagnosis not present

## 2024-05-27 DIAGNOSIS — Z96642 Presence of left artificial hip joint: Secondary | ICD-10-CM | POA: Diagnosis not present

## 2024-05-27 DIAGNOSIS — I5189 Other ill-defined heart diseases: Secondary | ICD-10-CM | POA: Diagnosis not present

## 2024-05-27 DIAGNOSIS — Z85850 Personal history of malignant neoplasm of thyroid: Secondary | ICD-10-CM | POA: Diagnosis not present

## 2024-05-27 DIAGNOSIS — M5416 Radiculopathy, lumbar region: Secondary | ICD-10-CM | POA: Diagnosis not present

## 2024-05-27 DIAGNOSIS — R931 Abnormal findings on diagnostic imaging of heart and coronary circulation: Secondary | ICD-10-CM | POA: Diagnosis not present

## 2024-05-27 DIAGNOSIS — Z9861 Coronary angioplasty status: Secondary | ICD-10-CM | POA: Diagnosis not present

## 2024-05-27 DIAGNOSIS — S92353A Displaced fracture of fifth metatarsal bone, unspecified foot, initial encounter for closed fracture: Secondary | ICD-10-CM | POA: Diagnosis not present

## 2024-05-27 DIAGNOSIS — C73 Malignant neoplasm of thyroid gland: Secondary | ICD-10-CM | POA: Diagnosis not present

## 2024-05-27 DIAGNOSIS — R7989 Other specified abnormal findings of blood chemistry: Secondary | ICD-10-CM | POA: Diagnosis not present

## 2024-05-28 ENCOUNTER — Ambulatory Visit: Payer: Self-pay | Attending: Surgery | Admitting: *Deleted

## 2024-05-28 ENCOUNTER — Telehealth (HOSPITAL_COMMUNITY): Payer: Self-pay

## 2024-05-28 ENCOUNTER — Telehealth: Payer: Self-pay | Admitting: Pharmacist

## 2024-05-28 DIAGNOSIS — I5189 Other ill-defined heart diseases: Secondary | ICD-10-CM | POA: Diagnosis not present

## 2024-05-28 DIAGNOSIS — Z85850 Personal history of malignant neoplasm of thyroid: Secondary | ICD-10-CM | POA: Diagnosis not present

## 2024-05-28 DIAGNOSIS — Z96642 Presence of left artificial hip joint: Secondary | ICD-10-CM | POA: Diagnosis not present

## 2024-05-28 DIAGNOSIS — Z9861 Coronary angioplasty status: Secondary | ICD-10-CM | POA: Diagnosis not present

## 2024-05-28 DIAGNOSIS — Z96641 Presence of right artificial hip joint: Secondary | ICD-10-CM | POA: Diagnosis not present

## 2024-05-28 DIAGNOSIS — Z951 Presence of aortocoronary bypass graft: Secondary | ICD-10-CM | POA: Diagnosis not present

## 2024-05-28 DIAGNOSIS — S92353A Displaced fracture of fifth metatarsal bone, unspecified foot, initial encounter for closed fracture: Secondary | ICD-10-CM | POA: Diagnosis not present

## 2024-05-28 DIAGNOSIS — E785 Hyperlipidemia, unspecified: Secondary | ICD-10-CM | POA: Diagnosis not present

## 2024-05-28 DIAGNOSIS — Z4802 Encounter for removal of sutures: Secondary | ICD-10-CM

## 2024-05-28 DIAGNOSIS — N898 Other specified noninflammatory disorders of vagina: Secondary | ICD-10-CM | POA: Diagnosis not present

## 2024-05-28 DIAGNOSIS — M5416 Radiculopathy, lumbar region: Secondary | ICD-10-CM | POA: Diagnosis not present

## 2024-05-28 DIAGNOSIS — C73 Malignant neoplasm of thyroid gland: Secondary | ICD-10-CM | POA: Diagnosis not present

## 2024-05-28 DIAGNOSIS — E559 Vitamin D deficiency, unspecified: Secondary | ICD-10-CM | POA: Diagnosis not present

## 2024-05-28 DIAGNOSIS — E890 Postprocedural hypothyroidism: Secondary | ICD-10-CM | POA: Diagnosis not present

## 2024-05-28 DIAGNOSIS — R931 Abnormal findings on diagnostic imaging of heart and coronary circulation: Secondary | ICD-10-CM | POA: Diagnosis not present

## 2024-05-28 DIAGNOSIS — R7989 Other specified abnormal findings of blood chemistry: Secondary | ICD-10-CM | POA: Diagnosis not present

## 2024-05-28 DIAGNOSIS — F319 Bipolar disorder, unspecified: Secondary | ICD-10-CM | POA: Diagnosis not present

## 2024-05-28 NOTE — Progress Notes (Signed)
 Pharmacy Quality Measure Review  This patient is appearing on a report for being at risk of failing the adherence measure for hypertension (ACEi/ARB) medications this calendar year.   Medication: losartan  Last fill date: 12/19/2023 for 90 day supply  Reviewed her med list and losartan  was stopped when she was recently discharged after CABG x 2 on 05/25/2024.   Reviewed recent refill history in Dr Annemarie database. Actual last refill dates were - 90 DS 09/26/23; 30 Day supply 5/21, 6/23, 7/28. Also filled 90 day supply on 8/25 and 9/3 per Dr Annemarie database all these refills were picked up.  Spoke with patient today. She is currently in rehab in nursing home. She is aware that losartan  was stopped and other medications changes made when she was recently discharged from the hospital.    Insurance report was not up to date. No action needed at this time.   Madelin Ray, PharmD Clinical Pharmacist [facility]  Population Health [phone]

## 2024-05-28 NOTE — Progress Notes (Signed)
 Patient arrived for nurse visit to remove sutures post-CABG 9/12 with Dr. Lucas. Four sutures removed with no signs or symptoms of infection noted.  Incisions well approximated. Patient tolerated suture removal well.  Patient and family instructed to keep the incision site clean and dry. Patient and family acknowledged instructions given.  All questions answered.

## 2024-05-28 NOTE — Telephone Encounter (Signed)
 Called patient to see if she is interested in the Cardiac Rehab Program. Patient expressed interest. Explained scheduling process and went over insurance, patient verbalized understanding. Will contact patient for scheduling once f/u has been completed.

## 2024-05-28 NOTE — Telephone Encounter (Signed)
 Pt insurance is active and benefits verified through [employer] adv Co-pay $15, DED 0/0 met, out of pocket $3,400/$1,599 met, co-insurance 0%. no pre-authorization required, [place] 05/28/24@3 :49, REF# [id number]  TCR/ICR? ICR Visit(date of service)limitation? No limit Can multiple codes be used on the same date of service/visit?(IF ITS A LIMIT) n/a  Is this a lifetime maximum or an annual maximum? annual Has the member used any of these services to date? no Is there a time limit (weeks/months) on start of program and/or program completion? no  Will contact patient to see if she is interested in the Cardiac Rehab Program. If interested, patient will need to complete follow up appt. Once completed, patient will be contacted for scheduling upon review by the RN Navigator.

## 2024-05-29 DIAGNOSIS — Z85850 Personal history of malignant neoplasm of thyroid: Secondary | ICD-10-CM | POA: Diagnosis not present

## 2024-05-29 DIAGNOSIS — R931 Abnormal findings on diagnostic imaging of heart and coronary circulation: Secondary | ICD-10-CM | POA: Diagnosis not present

## 2024-05-29 DIAGNOSIS — N898 Other specified noninflammatory disorders of vagina: Secondary | ICD-10-CM | POA: Diagnosis not present

## 2024-05-29 DIAGNOSIS — I5189 Other ill-defined heart diseases: Secondary | ICD-10-CM | POA: Diagnosis not present

## 2024-05-29 DIAGNOSIS — Z96641 Presence of right artificial hip joint: Secondary | ICD-10-CM | POA: Diagnosis not present

## 2024-05-29 DIAGNOSIS — Z951 Presence of aortocoronary bypass graft: Secondary | ICD-10-CM | POA: Diagnosis not present

## 2024-05-29 DIAGNOSIS — Z96642 Presence of left artificial hip joint: Secondary | ICD-10-CM | POA: Diagnosis not present

## 2024-05-29 DIAGNOSIS — C73 Malignant neoplasm of thyroid gland: Secondary | ICD-10-CM | POA: Diagnosis not present

## 2024-05-29 DIAGNOSIS — Z9861 Coronary angioplasty status: Secondary | ICD-10-CM | POA: Diagnosis not present

## 2024-05-29 DIAGNOSIS — M5416 Radiculopathy, lumbar region: Secondary | ICD-10-CM | POA: Diagnosis not present

## 2024-05-29 DIAGNOSIS — S92353A Displaced fracture of fifth metatarsal bone, unspecified foot, initial encounter for closed fracture: Secondary | ICD-10-CM | POA: Diagnosis not present

## 2024-05-29 DIAGNOSIS — Z79891 Long term (current) use of opiate analgesic: Secondary | ICD-10-CM | POA: Diagnosis not present

## 2024-05-29 DIAGNOSIS — R7989 Other specified abnormal findings of blood chemistry: Secondary | ICD-10-CM | POA: Diagnosis not present

## 2024-05-30 DIAGNOSIS — M5416 Radiculopathy, lumbar region: Secondary | ICD-10-CM | POA: Diagnosis not present

## 2024-05-30 DIAGNOSIS — Z96641 Presence of right artificial hip joint: Secondary | ICD-10-CM | POA: Diagnosis not present

## 2024-05-30 DIAGNOSIS — S92353A Displaced fracture of fifth metatarsal bone, unspecified foot, initial encounter for closed fracture: Secondary | ICD-10-CM | POA: Diagnosis not present

## 2024-05-30 DIAGNOSIS — Z96642 Presence of left artificial hip joint: Secondary | ICD-10-CM | POA: Diagnosis not present

## 2024-05-30 DIAGNOSIS — C73 Malignant neoplasm of thyroid gland: Secondary | ICD-10-CM | POA: Diagnosis not present

## 2024-05-30 DIAGNOSIS — Z951 Presence of aortocoronary bypass graft: Secondary | ICD-10-CM | POA: Diagnosis not present

## 2024-05-30 DIAGNOSIS — N898 Other specified noninflammatory disorders of vagina: Secondary | ICD-10-CM | POA: Diagnosis not present

## 2024-05-30 DIAGNOSIS — Z9861 Coronary angioplasty status: Secondary | ICD-10-CM | POA: Diagnosis not present

## 2024-05-30 DIAGNOSIS — R931 Abnormal findings on diagnostic imaging of heart and coronary circulation: Secondary | ICD-10-CM | POA: Diagnosis not present

## 2024-05-30 DIAGNOSIS — I5189 Other ill-defined heart diseases: Secondary | ICD-10-CM | POA: Diagnosis not present

## 2024-05-30 DIAGNOSIS — R7989 Other specified abnormal findings of blood chemistry: Secondary | ICD-10-CM | POA: Diagnosis not present

## 2024-05-30 DIAGNOSIS — Z85850 Personal history of malignant neoplasm of thyroid: Secondary | ICD-10-CM | POA: Diagnosis not present

## 2024-05-30 NOTE — Addendum Note (Signed)
 Addendum  created 05/30/24 0756 by Keneth Lynwood POUR, MD   Clinical Note Signed, Intraprocedure Blocks edited, SmartForm saved

## 2024-05-31 DIAGNOSIS — N898 Other specified noninflammatory disorders of vagina: Secondary | ICD-10-CM | POA: Diagnosis not present

## 2024-05-31 DIAGNOSIS — Z96641 Presence of right artificial hip joint: Secondary | ICD-10-CM | POA: Diagnosis not present

## 2024-05-31 DIAGNOSIS — R7989 Other specified abnormal findings of blood chemistry: Secondary | ICD-10-CM | POA: Diagnosis not present

## 2024-05-31 DIAGNOSIS — Z951 Presence of aortocoronary bypass graft: Secondary | ICD-10-CM | POA: Diagnosis not present

## 2024-05-31 DIAGNOSIS — S92353A Displaced fracture of fifth metatarsal bone, unspecified foot, initial encounter for closed fracture: Secondary | ICD-10-CM | POA: Diagnosis not present

## 2024-05-31 DIAGNOSIS — R931 Abnormal findings on diagnostic imaging of heart and coronary circulation: Secondary | ICD-10-CM | POA: Diagnosis not present

## 2024-05-31 DIAGNOSIS — I5189 Other ill-defined heart diseases: Secondary | ICD-10-CM | POA: Diagnosis not present

## 2024-05-31 DIAGNOSIS — C73 Malignant neoplasm of thyroid gland: Secondary | ICD-10-CM | POA: Diagnosis not present

## 2024-05-31 DIAGNOSIS — Z9861 Coronary angioplasty status: Secondary | ICD-10-CM | POA: Diagnosis not present

## 2024-05-31 DIAGNOSIS — Z96642 Presence of left artificial hip joint: Secondary | ICD-10-CM | POA: Diagnosis not present

## 2024-05-31 DIAGNOSIS — M5416 Radiculopathy, lumbar region: Secondary | ICD-10-CM | POA: Diagnosis not present

## 2024-05-31 DIAGNOSIS — Z85850 Personal history of malignant neoplasm of thyroid: Secondary | ICD-10-CM | POA: Diagnosis not present

## 2024-06-02 DIAGNOSIS — I5189 Other ill-defined heart diseases: Secondary | ICD-10-CM | POA: Diagnosis not present

## 2024-06-02 DIAGNOSIS — C73 Malignant neoplasm of thyroid gland: Secondary | ICD-10-CM | POA: Diagnosis not present

## 2024-06-02 DIAGNOSIS — Z96642 Presence of left artificial hip joint: Secondary | ICD-10-CM | POA: Diagnosis not present

## 2024-06-02 DIAGNOSIS — Z96641 Presence of right artificial hip joint: Secondary | ICD-10-CM | POA: Diagnosis not present

## 2024-06-02 DIAGNOSIS — Z9861 Coronary angioplasty status: Secondary | ICD-10-CM | POA: Diagnosis not present

## 2024-06-02 DIAGNOSIS — R931 Abnormal findings on diagnostic imaging of heart and coronary circulation: Secondary | ICD-10-CM | POA: Diagnosis not present

## 2024-06-02 DIAGNOSIS — R7989 Other specified abnormal findings of blood chemistry: Secondary | ICD-10-CM | POA: Diagnosis not present

## 2024-06-02 DIAGNOSIS — Z85850 Personal history of malignant neoplasm of thyroid: Secondary | ICD-10-CM | POA: Diagnosis not present

## 2024-06-02 DIAGNOSIS — M5416 Radiculopathy, lumbar region: Secondary | ICD-10-CM | POA: Diagnosis not present

## 2024-06-02 DIAGNOSIS — N898 Other specified noninflammatory disorders of vagina: Secondary | ICD-10-CM | POA: Diagnosis not present

## 2024-06-02 DIAGNOSIS — Z951 Presence of aortocoronary bypass graft: Secondary | ICD-10-CM | POA: Diagnosis not present

## 2024-06-02 DIAGNOSIS — S92353A Displaced fracture of fifth metatarsal bone, unspecified foot, initial encounter for closed fracture: Secondary | ICD-10-CM | POA: Diagnosis not present

## 2024-06-03 DIAGNOSIS — I5189 Other ill-defined heart diseases: Secondary | ICD-10-CM | POA: Diagnosis not present

## 2024-06-03 DIAGNOSIS — Z85850 Personal history of malignant neoplasm of thyroid: Secondary | ICD-10-CM | POA: Diagnosis not present

## 2024-06-03 DIAGNOSIS — C73 Malignant neoplasm of thyroid gland: Secondary | ICD-10-CM | POA: Diagnosis not present

## 2024-06-03 DIAGNOSIS — Z951 Presence of aortocoronary bypass graft: Secondary | ICD-10-CM | POA: Diagnosis not present

## 2024-06-03 DIAGNOSIS — R931 Abnormal findings on diagnostic imaging of heart and coronary circulation: Secondary | ICD-10-CM | POA: Diagnosis not present

## 2024-06-03 DIAGNOSIS — Z9861 Coronary angioplasty status: Secondary | ICD-10-CM | POA: Diagnosis not present

## 2024-06-03 DIAGNOSIS — R7989 Other specified abnormal findings of blood chemistry: Secondary | ICD-10-CM | POA: Diagnosis not present

## 2024-06-03 DIAGNOSIS — Z96642 Presence of left artificial hip joint: Secondary | ICD-10-CM | POA: Diagnosis not present

## 2024-06-03 DIAGNOSIS — S92353A Displaced fracture of fifth metatarsal bone, unspecified foot, initial encounter for closed fracture: Secondary | ICD-10-CM | POA: Diagnosis not present

## 2024-06-03 DIAGNOSIS — Z96641 Presence of right artificial hip joint: Secondary | ICD-10-CM | POA: Diagnosis not present

## 2024-06-03 DIAGNOSIS — N898 Other specified noninflammatory disorders of vagina: Secondary | ICD-10-CM | POA: Diagnosis not present

## 2024-06-03 DIAGNOSIS — M5416 Radiculopathy, lumbar region: Secondary | ICD-10-CM | POA: Diagnosis not present

## 2024-06-04 DIAGNOSIS — C73 Malignant neoplasm of thyroid gland: Secondary | ICD-10-CM | POA: Diagnosis not present

## 2024-06-04 DIAGNOSIS — Z96641 Presence of right artificial hip joint: Secondary | ICD-10-CM | POA: Diagnosis not present

## 2024-06-04 DIAGNOSIS — R931 Abnormal findings on diagnostic imaging of heart and coronary circulation: Secondary | ICD-10-CM | POA: Diagnosis not present

## 2024-06-04 DIAGNOSIS — M5416 Radiculopathy, lumbar region: Secondary | ICD-10-CM | POA: Diagnosis not present

## 2024-06-04 DIAGNOSIS — R7989 Other specified abnormal findings of blood chemistry: Secondary | ICD-10-CM | POA: Diagnosis not present

## 2024-06-04 DIAGNOSIS — Z951 Presence of aortocoronary bypass graft: Secondary | ICD-10-CM | POA: Diagnosis not present

## 2024-06-04 DIAGNOSIS — Z96642 Presence of left artificial hip joint: Secondary | ICD-10-CM | POA: Diagnosis not present

## 2024-06-04 DIAGNOSIS — Z85850 Personal history of malignant neoplasm of thyroid: Secondary | ICD-10-CM | POA: Diagnosis not present

## 2024-06-04 DIAGNOSIS — N898 Other specified noninflammatory disorders of vagina: Secondary | ICD-10-CM | POA: Diagnosis not present

## 2024-06-04 DIAGNOSIS — I5189 Other ill-defined heart diseases: Secondary | ICD-10-CM | POA: Diagnosis not present

## 2024-06-04 DIAGNOSIS — Z9861 Coronary angioplasty status: Secondary | ICD-10-CM | POA: Diagnosis not present

## 2024-06-04 DIAGNOSIS — S92353A Displaced fracture of fifth metatarsal bone, unspecified foot, initial encounter for closed fracture: Secondary | ICD-10-CM | POA: Diagnosis not present

## 2024-06-06 ENCOUNTER — Telehealth: Payer: Self-pay | Admitting: Cardiology

## 2024-06-06 NOTE — Telephone Encounter (Signed)
 Spoke with the patient and reviewed all of her medications that she should be taking. Patient verbalized understanding.

## 2024-06-06 NOTE — Telephone Encounter (Signed)
 Left message for patient to call back

## 2024-06-06 NOTE — Telephone Encounter (Signed)
 Pt returning nurse call to go over medications

## 2024-06-06 NOTE — Telephone Encounter (Signed)
 Pt is requesting a callback regarding her wanting to discuss all of her heart medications and how she should be taking them. Please advise

## 2024-06-08 DIAGNOSIS — E890 Postprocedural hypothyroidism: Secondary | ICD-10-CM | POA: Diagnosis not present

## 2024-06-08 DIAGNOSIS — R7303 Prediabetes: Secondary | ICD-10-CM | POA: Diagnosis not present

## 2024-06-08 DIAGNOSIS — M1990 Unspecified osteoarthritis, unspecified site: Secondary | ICD-10-CM | POA: Diagnosis not present

## 2024-06-08 DIAGNOSIS — R32 Unspecified urinary incontinence: Secondary | ICD-10-CM | POA: Diagnosis not present

## 2024-06-08 DIAGNOSIS — Z8781 Personal history of (healed) traumatic fracture: Secondary | ICD-10-CM | POA: Diagnosis not present

## 2024-06-08 DIAGNOSIS — G894 Chronic pain syndrome: Secondary | ICD-10-CM | POA: Diagnosis not present

## 2024-06-08 DIAGNOSIS — Z8619 Personal history of other infectious and parasitic diseases: Secondary | ICD-10-CM | POA: Diagnosis not present

## 2024-06-08 DIAGNOSIS — I4891 Unspecified atrial fibrillation: Secondary | ICD-10-CM | POA: Diagnosis not present

## 2024-06-08 DIAGNOSIS — Z96642 Presence of left artificial hip joint: Secondary | ICD-10-CM | POA: Diagnosis not present

## 2024-06-08 DIAGNOSIS — F319 Bipolar disorder, unspecified: Secondary | ICD-10-CM | POA: Diagnosis not present

## 2024-06-08 DIAGNOSIS — E041 Nontoxic single thyroid nodule: Secondary | ICD-10-CM | POA: Diagnosis not present

## 2024-06-08 DIAGNOSIS — K760 Fatty (change of) liver, not elsewhere classified: Secondary | ICD-10-CM | POA: Diagnosis not present

## 2024-06-08 DIAGNOSIS — K5900 Constipation, unspecified: Secondary | ICD-10-CM | POA: Diagnosis not present

## 2024-06-08 DIAGNOSIS — I119 Hypertensive heart disease without heart failure: Secondary | ICD-10-CM | POA: Diagnosis not present

## 2024-06-08 DIAGNOSIS — M48061 Spinal stenosis, lumbar region without neurogenic claudication: Secondary | ICD-10-CM | POA: Diagnosis not present

## 2024-06-08 DIAGNOSIS — E559 Vitamin D deficiency, unspecified: Secondary | ICD-10-CM | POA: Diagnosis not present

## 2024-06-08 DIAGNOSIS — Z48812 Encounter for surgical aftercare following surgery on the circulatory system: Secondary | ICD-10-CM | POA: Diagnosis not present

## 2024-06-08 DIAGNOSIS — Z951 Presence of aortocoronary bypass graft: Secondary | ICD-10-CM | POA: Diagnosis not present

## 2024-06-08 DIAGNOSIS — E785 Hyperlipidemia, unspecified: Secondary | ICD-10-CM | POA: Diagnosis not present

## 2024-06-08 DIAGNOSIS — F419 Anxiety disorder, unspecified: Secondary | ICD-10-CM | POA: Diagnosis not present

## 2024-06-08 DIAGNOSIS — Z85850 Personal history of malignant neoplasm of thyroid: Secondary | ICD-10-CM | POA: Diagnosis not present

## 2024-06-08 DIAGNOSIS — I25119 Atherosclerotic heart disease of native coronary artery with unspecified angina pectoris: Secondary | ICD-10-CM | POA: Diagnosis not present

## 2024-06-08 DIAGNOSIS — M5116 Intervertebral disc disorders with radiculopathy, lumbar region: Secondary | ICD-10-CM | POA: Diagnosis not present

## 2024-06-08 DIAGNOSIS — K219 Gastro-esophageal reflux disease without esophagitis: Secondary | ICD-10-CM | POA: Diagnosis not present

## 2024-06-09 NOTE — Progress Notes (Unsigned)
 Cardiology Office Note    Date:  06/11/2024  ID:  Becky Lawson, DOB 1951/08/02, MRN [id number] PCP:  Levora Reyes SAUNDERS, MD  Cardiologist:  Gordy Bergamo, MD  Electrophysiologist:  None   Chief Complaint: Follow up for CAD   History of Present Illness: .   Becky Lawson is a 73 y.o. female with visit-pertinent history of prediabetes, hypertension, hyperlipidemia, prior tobacco use.  First evaluated on 09/28/2023 by Dr. Alvan for evaluation of chest pain.  At office visit patient reported that she had had intermittent chest pain for the prior 3 weeks, pain described as a sudden, heavy pressure in the middle chest that sometimes radiated to the chin.  Coronary CTA on 10/12/2023 indicated at least moderate stenosis, coronary calcium  score of 3084, 99th percentile for age, sex and race matched controls.  Total plaque volume 1268 mm3, 96 percentile for age and sex matched control.  CT FFR analysis did not show any significant focal stenosis, there was gradual decrease in FFR across LCx system, FFR drops when the vessel becomes smaller in caliber after OM2 but there is no discrete point of stenosis, proximal FFR 0.98, mid FFR after OM two 0.76, distal FFR of 0.7.  Echocardiogram on 11/08/2023 indicated LVEF of 65 to 70%, no RWMA, grade 1 diastolic dysfunction, RV size and function was normal, LA was moderately dilated, aortic valve sclerosis was evident without aortic valve stenosis.  Patient was seen in clinic on 12/25/23 by Dr. Court patient denied any chest pain however did report jaw pain which was nitrate responsive.  A cardiac PET was ordered to further evaluate.  PET stress on 03/27/2024 indicated abnormal LV perfusion with evidence of ischemia, no evidence of infarction.  There was a medium defect with moderate reduction in uptake present in the apical septal and apex locations that was reversible, there was normal wall motion in the defect area, consistent with ischemia, noted to have severe three-vessel  calcium , abnormal MBF T and 3 times daily suggesting three-vessel CAD and not just distal LAD ischemia.  Patient was seen in clinic on 03/28/2024, she reported continued intermittent episodes of chest discomfort, typically resolve with rest or a single sublingual nitroglycerin .  Patient was set up for cardiac catheterization.  Cardiac catheterization on 04/03/24 Indicated severe three-vessel CAD, had successful PCI/DES to the LCx that was complicated by dissection with recommendations for DAPT with ASA/Plavix  and planned for staged intervention of the LAD and RCA in 2 to 3 weeks.  At heart team meeting on 04/05/2024 after presentation was felt the best option would be to proceed with bypass surgery to the LAD, RCA and potentially if there is any restenosis by relook angiogram in 3 months bypass surgery to circumflex marginal.  On 05/17/2024 patient underwent CABG x 2 with left internal mammary artery graft to the LAD and right internal mammary artery graft to the RCA with Dr. Lucas.  During the evening of postop day 1 she developed atrial fibrillation with RVR, she converted back to sinus rhythm on amiodarone  infusion this was transition to p.o. amiodarone  on postop day 2.  Patient was started back on Plavix  given recent stenting.  She then converted back into atrial fibrillation and was treated with 2 amiodarone  boluses with improvement of heart rate.  Patient was discharged on oral amiodarone , aspirin  80 mg daily, Lipitor  80 mg daily, Plavix  75 mg daily, metoprolol  tartrate 50 mg twice daily.  Patient was discharged to SNF on 05/25/24.   Today she presents for  follow-up.  She reports that she has been doing well overall, reports some occasional chest soreness. Denies any similar to her prior chest pain, denies lower extremity edema, orthopnea or pnd.  She reports some increased dyspnea with prolonged exertion, feels that this is related to deconditioning and feels it has been improving.  She notes that at her  skilled nursing facility she was not very active and did not do a significant mount of rehab.  She reports that she has been doing much better since returning home, is walking with a walker.  She is happy to be back at home with her cats.  She denies feeling any palpitations or irregular heartbeats.  She notes some confusion regarding her medications following her discharge from skilled nursing facility. ROS: .   Today she denies chest pain, shortness of breath, lower extremity edema, fatigue, palpitations, melena, hematuria, hemoptysis, diaphoresis, weakness, presyncope, syncope, orthopnea, and PND.  All other systems are reviewed and otherwise negative. Studies Reviewed: [name]   EKG:  EKG is ordered today, personally reviewed, demonstrating  EKG Interpretation Date/Time:  Monday June 10 2024 09:35:35 EDT Ventricular Rate:  70 PR Interval:  194 QRS Duration:  86 QT Interval:  386 QTC Calculation: 416 R Axis:   0  Text Interpretation: Normal sinus rhythm Left ventricular hypertrophy with repolarization abnormality ( R in aVL ) When compared with ECG of 18-May-2024 19:05, Sinus rhythm has replaced Atrial fibrillation Vent. rate has decreased BY  57 BPM Confirmed by Lewin Pellow [phone]) on 06/10/2024 10:02:38 AM   CV Studies: Cardiac studies reviewed are outlined and summarized above. Otherwise please see EMR for full report. Cardiac Studies & Procedures   ______________________________________________________________________________________________ CARDIAC CATHETERIZATION  CARDIAC CATHETERIZATION 04/03/2024  Conclusion Images from the original result were not included. Cardiac Catheterization 04/03/24: Hemodynamic data: LVEDP 35 mm Hg, Markedly elevated, no pressure gradient across the aortic valve.  Angiographic data: LM: Large-caliber vessel, mildly calcified. LAD: It is a moderate large caliber vessel, proximal LAD has diffuse 30 to 40% calcific stenosis followed by a very focal 95%  calcific stenosis at the origin of D2 and is tortuous at this segment.  Distal-apical LAD has a 70% focal stenosis. LCx: Codominant with RCA.  Moderately calcified.  There is a very focal 95% calcific stenosis in the midsegment just proximal to the origin of a very large OM1.  OM1 has a ostial 30 to 40% calcific stenosis. RCA: Codominant with CX, continues as a moderate-sized PL branch with very small PDA, has a focal 90% stenosis at origin of RV branch in the midsegment.  Intervention data: Successful but difficult procedure due to calcification and distal dissection.  PTCA and stenting of the mid CX and OM1 with 2 overlapping 3.5 x 12 in the CX and 3.0 x 12 mm Synergy XD DES overlapping into the OM1, stenosis reduced from 95% to 0% with TIMI-3 to TIMI-3 flow.    Impression and recommendations: Patient has severely complex three-vessel coronary disease and probably would be better suited for CABG.  I attempted to pass the guidewire into the LAD after the CX PCI, and I was unable to pass a 2.5 mm balloon to predilate the lesion.  There was also spasm mid segment Cx and also a small type a dissection was evident postprocedure in the LAD.  I suspect if she needs PCI to the LAD, it is can be a complex long-term intervention with atherectomy.  RCA lesion is very focal.  I have recommended that we observe  her overnight for any potential post PCI complication, discharge in the morning, elective staged intervention to RCA and will take a relook at LAD at that time and consider atherectomy followed by stenting of a focal high-grade mid LAD stenosis avoiding stenting the long proximal segment.  Findings Coronary Findings Diagnostic  Dominance: Co-dominant  Left Anterior Descending Prox LAD to Mid LAD lesion is 40% stenosed. Mid LAD lesion is 95% stenosed. Dist LAD lesion is 70% stenosed.  Left Circumflex Prox Cx to Mid Cx lesion is 95% stenosed. The lesion is type C. The lesion is moderately  calcified.  First Obtuse Marginal Branch 1st Mrg lesion is 40% stenosed.  Right Coronary Artery Prox RCA lesion is 90% stenosed.  Intervention  Prox Cx to Mid Cx lesion Angioplasty Lesion length:  8 mm. CATH VISTA GUIDE 6FR XBLD 3.5 guide catheter was inserted. WIRE RUNTHROUGH .[id number] guidewire used to cross lesion. Balloon angioplasty was performed using a BALLOON EMERGE MR 3.5X15. Maximum pressure: 4 atm. Inflation time: 30 sec. Stent A drug-eluting stent was successfully placed using a STENT SYNERGY XD 3.50X12. Maximum pressure: 13 atm. Inflation time: 45 sec. Stent strut is well apposed. Post-Intervention Lesion Assessment The intervention was successful. Pre-interventional TIMI flow is 3. Post-intervention TIMI flow is 3. No complications occurred at this lesion. There is a 0% residual stenosis post intervention.  1st Mrg lesion Stent A drug-eluting stent was successfully placed using a STENT SYNERGY XD 3.0X12. Maximum pressure: 11 atm. Inflation time: 30 sec. Stent strut is well apposed. Stent overlaps previously placed stent. Post-stent angioplasty was performed. Stent balloon used to post dill overlap at 16 atm for 30 S Post-Intervention Lesion Assessment The intervention was successful. Pre-interventional TIMI flow is 3. Post-intervention TIMI flow is 3. At this lesion, a dissection occurred. As I pulled the LAD guide wire, the guide catheter was sucked into the LAD and there was Prox LAD type A dissection without flow compromise and hence left alone due to complexity of the LAD stenosis. There is a 0% residual stenosis post intervention.   STRESS TESTS  NM PET CT CARDIAC PERFUSION MULTI W/ABSOLUTE BLOODFLOW 03/27/2024  Narrative   LV perfusion is abnormal. There is evidence of ischemia. There is no evidence of infarction. Defect 1: There is a medium defect with moderate reduction in uptake present in the apical septal and apex location(s) that is reversible. There is  normal wall motion in the defect area. Consistent with ischemia.   Rest left ventricular function is normal. Rest EF: 59%. Stress left ventricular function is normal. Stress EF: 63%. End diastolic cavity size is normal. End systolic cavity size is normal.   Myocardial blood flow was computed to be 0.47ml/g/min at rest and 1.40ml/g/min at stress. Global myocardial blood flow reserve was 1.52 and was abnormal.   Coronary calcium  was present on the attenuation correction CT images. Severe coronary calcifications were present. Coronary calcifications were present in the left anterior descending artery, left circumflex artery and right coronary artery distribution(s).   Findings are consistent with ischemia. The study is intermediate risk.   Severely reduced MBFR in area of ischemia 0.88   Severe 3 vessel calcium  , abnormal MBFT and TID suggests 3 vessel CAD and not just distal LAD ischemia  EXAM: OVER-READ INTERPRETATION  PET-CT CHEST  The following report is an over-read performed by radiologist Dr. Camellia Lang [facility] on 03/27/2024. This over-read does not include interpretation of cardiac or coronary anatomy or pathology. The cardiac PET and cardiac CT  interpretation by the cardiologist is to be attached.  COMPARISON:  None.  FINDINGS: No evidence for lymphadenopathy within the visualized mediastinum or hilar regions.  The visualized lung parenchyma shows no suspicious pulmonary nodule or mass. No focal airspace consolidation. No effusion.  Visualized portions of the upper abdomen are unremarkable.  No suspicious lytic or sclerotic osseous abnormality.  IMPRESSION: No acute or clinically significant extracardiac findings.   Electronically Signed By: Camellia Candle M.D. On: 03/27/2024 09:25   ECHOCARDIOGRAM  ECHOCARDIOGRAM COMPLETE 11/08/2023  Narrative ECHOCARDIOGRAM REPORT    Patient Name:   Becky Lawson  Date of Exam: 11/08/2023 Medical Rec #:  [id number]      Height:       64.5 in Accession #:    [id number]    Weight:       200.0 lb Date of Birth:  [birth year]     BSA:          1.967 m Patient Age:    72 years      BP:           138/78 mmHg Patient Gender: F             HR:           108 bpm. Exam Location:  [address]  Procedure: 2D Echo, Cardiac Doppler and Color Doppler (Both Spectral and Color Flow Doppler were utilized during procedure).  Indications:    R93.1 CAD  History:        Patient has prior history of Echocardiogram examinations, most recent 06/21/2022. CAD, Signs/Symptoms:Murmur and Chest Pain; Risk Factors:Hypertension, Dyslipidemia and Former Smoker.  Sonographer:    Elsie Bohr RDCS Referring Phys: [phone] MARY E BRANCH  IMPRESSIONS   1. Left ventricular ejection fraction, by estimation, is 65 to 70%. The left ventricle has normal function. The left ventricle has no regional wall motion abnormalities. Left ventricular diastolic parameters are consistent with Grade I diastolic dysfunction (impaired relaxation). 2. Right ventricular systolic function is normal. The right ventricular size is normal. 3. Left atrial size was moderately dilated. 4. The mitral valve is normal in structure. Mild mitral valve regurgitation. No evidence of mitral stenosis. 5. The aortic valve is normal in structure. There is mild calcification of the aortic valve. Aortic valve regurgitation is not visualized. Aortic valve sclerosis/calcification is present, without any evidence of aortic stenosis. 6. There is high flow/turbulence over the pulmonic valve but no evidence of stenosis. 7. The inferior vena cava is normal in size with greater than 50% respiratory variability, suggesting right atrial pressure of 3 mmHg.  FINDINGS Left Ventricle: Left ventricular ejection fraction, by estimation, is 65 to 70%. The left ventricle has normal function. The left ventricle has no regional wall motion abnormalities. The left ventricular internal cavity  size was normal in size. There is no left ventricular hypertrophy. Left ventricular diastolic parameters are consistent with Grade I diastolic dysfunction (impaired relaxation).  Right Ventricle: The right ventricular size is normal. No increase in right ventricular wall thickness. Right ventricular systolic function is normal.  Left Atrium: Left atrial size was moderately dilated.  Right Atrium: Right atrial size was normal in size.  Pericardium: There is no evidence of pericardial effusion.  Mitral Valve: The mitral valve is normal in structure. Mild mitral annular calcification. Mild mitral valve regurgitation. No evidence of mitral valve stenosis.  Tricuspid Valve: The tricuspid valve is normal in structure. Tricuspid valve regurgitation is mild . No evidence of tricuspid stenosis.  Aortic Valve: The  aortic valve is normal in structure. There is mild calcification of the aortic valve. Aortic valve regurgitation is not visualized. Aortic valve sclerosis/calcification is present, without any evidence of aortic stenosis.  Pulmonic Valve: The pulmonic valve was normal in structure. Pulmonic valve regurgitation is not visualized. No evidence of pulmonic stenosis.  Aorta: The aortic root is normal in size and structure.  Pulmonary Artery: There is high flow/turbulence over the pulmonic valve but no evidence of stenosis.  Venous: The inferior vena cava is normal in size with greater than 50% respiratory variability, suggesting right atrial pressure of 3 mmHg.  IAS/Shunts: No atrial level shunt detected by color flow Doppler.   LEFT VENTRICLE PLAX 2D LVIDd:         3.60 cm   Diastology LVIDs:         2.50 cm   LV e' medial:    9.25 cm/s LV PW:         1.20 cm   LV E/e' medial:  11.4 LV IVS:        1.50 cm   LV e' lateral:   17.30 cm/s LVOT diam:     2.10 cm   LV E/e' lateral: 6.1 LV SV:         90 LV SV Index:   46 LVOT Area:     3.46 cm   RIGHT VENTRICLE             IVC RV S  prime:     19.20 cm/s  IVC diam: 0.80 cm TAPSE (M-mode): 1.9 cm RVSP:           26.2 mmHg  LEFT ATRIUM             Index        RIGHT ATRIUM           Index LA diam:        4.10 cm 2.08 cm/m   RA Pressure: 3.00 mmHg LA Vol (A2C):   69.8 ml 35.49 ml/m  RA Area:     13.60 cm LA Vol (A4C):   78.3 ml 39.81 ml/m  RA Volume:   32.80 ml  16.68 ml/m LA Biplane Vol: 79.7 ml 40.52 ml/m AORTIC VALVE LVOT Vmax:   136.00 cm/s LVOT Vmean:  94.600 cm/s LVOT VTI:    0.260 m  AORTA Ao Root diam: 3.30 cm Ao Asc diam:  3.50 cm  MITRAL VALVE                TRICUSPID VALVE MV Area (PHT): 2.87 cm     TR Peak grad:   23.2 mmHg MV Decel Time: 264 msec     TR Vmax:        241.00 cm/s MV E velocity: 105.00 cm/s  Estimated RAP:  3.00 mmHg MV A velocity: 134.00 cm/s  RVSP:           26.2 mmHg MV E/A ratio:  0.78 SHUNTS Systemic VTI:  0.26 m Systemic Diam: 2.10 cm  Toribio Fuel MD Electronically signed by Toribio Fuel MD Signature Date/Time: 11/08/2023/3:36:55 PM    Final   TEE  ECHO INTRAOPERATIVE TEE 05/17/2024  Narrative *INTRAOPERATIVE TRANSESOPHAGEAL REPORT *    Patient Name:   Becky Lawson Date of Exam: 05/17/2024 Medical Rec #:  [id number]    Height:       64.5 in Accession #:    [id number]   Weight:       190.0 lb Date of Birth:  17-Aug-1951  BSA:          1.92 m Patient Age:    73 years     BP:           158/80 mmHg Patient Gender: F            HR:           66 bpm. Exam Location:  Inpatient  Transesophogeal exam was perform intraoperatively during surgical procedure. Patient was closely monitored under general anesthesia during the entirety of examination.  Indications:     CAD, multiple vessel I25.10 Sonographer:     Koleen Popper RDCS Performing Phys: [id number] BARTLE Diagnosing Phys: Lynwood Cornea  Complications: No known complications during this procedure. POST-OP IMPRESSIONS _ Left Ventricle: The left ventricle is unchanged from pre-bypass.  Normal systolic function. _ Right Ventricle: The right ventricle appears unchanged from pre-bypass; normal function. _ Aorta: The aorta appears unchanged from pre-bypass. There is no dissection present in the aorta. _ Left Atrial Appendage: The left atrial appendage appears unchanged from pre-bypass. _ Aortic Valve: The aortic valve appears unchanged from pre-bypass. Trace regurgitation. _ Mitral Valve: The mitral valve appears unchanged from pre-bypass. There is mild regurgitation. _ Tricuspid Valve: The tricuspid valve appears unchanged from pre-bypass. There is mild regurgitation. _ Pulmonic Valve: The pulmonic valve appears unchanged from pre-bypass. _ Interatrial Septum: The interatrial septum appears unchanged from pre-bypass. _ Pericardium: The pericardium appears unchanged from pre-bypass. _ Comments: No significant pleural or pericardial effusion.  PRE-OP FINDINGS Left Ventricle: The left ventricle has normal systolic function, with an ejection fraction of 55-60%. The cavity size was normal. There is mild concentric left ventricular hypertrophy.  Right Ventricle: The right ventricle has normal systolic function. The cavity was normal. There is no increase in right ventricular wall thickness. Catheter present in the right ventricle.  Left Atrium: Left atrial size was not assessed. No left atrial/left atrial appendage thrombus was detected.  Right Atrium: Right atrial size was not assessed.  Interatrial Septum: No atrial level shunt detected by color flow Doppler.  Pericardium: There is no evidence of pericardial effusion. There is no pleural effusion.  Mitral Valve: The mitral valve is normal in structure. Mitral valve regurgitation is mild by color flow Doppler. There is no evidence of mitral stenosis.  Tricuspid Valve: The tricuspid valve was normal in structure. Tricuspid valve regurgitation is mild by color flow Doppler. No evidence of tricuspid stenosis is  present.  Aortic Valve: The aortic valve is tricuspid. Aortic valve regurgitation is trivial by color flow Doppler. There is no stenosis of the aortic valve, with a calculated valve area of 2.42 cm. There is moderate calcification present on the aortic valve left coronary cusp with mildly decreased mobility.  Pulmonic Valve: The pulmonic valve was normal in structure No evidence of pumonic stenosis. Pulmonic valve regurgitation is trivial by color flow Doppler.   Aorta: The aortic root, ascending aorta and aortic arch are normal in size and structure. There is evidence of plaque in the descending aorta; Grade IV, measuring >50mm in size.  +--------------+--------++ LEFT VENTRICLE          +----------------+---------++ +--------------+--------++  Diastology                PLAX 2D                 +----------------+---------++ +--------------+--------++  LV e' lateral:  7.41 cm/s LVOT diam:    2.30 cm   +----------------+---------++ +--------------+--------++  LV E/e' lateral:9.5  LVOT Area:    4.15 cm  +----------------+---------++ +--------------+--------++                        +--------------+--------++  +---------------+------+-------+ RIGHT VENTRICLE              +---------------+------+-------+ TAPSE (M-mode):1.7 cm2.37 cm +---------------+------+-------+  +------------------+------------++ AORTIC VALVE                   +------------------+------------++ AV Area (Vmax):   2.63 cm     +------------------+------------++ AV Area (Vmean):  2.41 cm     +------------------+------------++ AV Area (VTI):    2.42 cm     +------------------+------------++ AV Vmax:          157.00 cm/s  +------------------+------------++ AV Vmean:         111.000 cm/s +------------------+------------++ AV VTI:           0.347 m      +------------------+------------++ AV Peak Grad:     9.9 mmHg      +------------------+------------++ AV Mean Grad:     6.0 mmHg     +------------------+------------++ LVOT Vmax:        99.50 cm/s   +------------------+------------++ LVOT Vmean:       64.300 cm/s  +------------------+------------++ LVOT VTI:         0.202 m      +------------------+------------++ LVOT/AV VTI ratio:0.58         +------------------+------------++  +--------------+----------++ MITRAL VALVE              +--------------+-------+ +--------------+----------++  SHUNTS                MV Area (PHT):3.12 cm    +--------------+-------+ +--------------+----------++  Systemic VTI: 0.20 m  MV PHT:       70.47 msec  +--------------+-------+ +--------------+----------++  Systemic Diam:2.30 cm MV Decel Time:243 msec    +--------------+-------+ +--------------+----------++ +--------------+----------++ MV E velocity:70.20 cm/s +--------------+----------++ MV A velocity:60.90 cm/s +--------------+----------++ MV E/A ratio: 1.15       +--------------+----------++   Lynwood Cornea Electronically signed by Lynwood Cornea Signature Date/Time: 05/17/2024/3:32:28 PM    Final    CT SCANS  CT CORONARY MORPH W/CTA COR W/SCORE 10/12/2023  Addendum 10/26/2023  9:16 PM ADDENDUM REPORT: 10/26/2023 21:13  EXAM: OVER-READ INTERPRETATION  CT CHEST  The following report is an over-read performed by radiologist Dr. Fonda Mom [facility] on 10/26/2023. This over-read does not include interpretation of cardiac or coronary anatomy or pathology. The coronary CTA interpretation by the cardiologist is attached.  COMPARISON:  None.  FINDINGS: Cardiovascular:  See findings discussed in the body of the report.  Mediastinum/Nodes: No suspicious adenopathy identified. Imaged mediastinal structures are unremarkable.  Lungs/Pleura: There is dependent basilar subsegmental atelectasis. No pneumonia or pulmonary edema.  No pleural effusion or pneumothorax.  Upper Abdomen: No acute abnormality.  Musculoskeletal: No chest wall abnormality. No acute osseous findings. There are thoracic degenerative changes.  IMPRESSION:  No acute extracardiac incidental findings.   Electronically Signed By: Fonda Field M.D. On: 10/26/2023 21:13  Narrative HISTORY: Chest pain/anginal equiv, intermediate CAD risk, treadmill candidate  EXAM: Cardiac/Coronary CT  TECHNIQUE: The patient was scanned on a [employer].  PROTOCOL: A 120 kV prospective scan was triggered in the descending thoracic aorta at 111 HU's. Axial non-contrast 3 mm slices were carried out through the heart. The data set was analyzed on a dedicated work station and scored using the Agatston method. Gantry rotation speed was 250 msecs and  collimation was 0.6 mm. Heart rate was optimized medically and sl NTG was given. The 3D data set was reconstructed in 5% intervals of the 35-75 % of the R-R cycle. Systolic and diastolic phases were analyzed on a dedicated work station using MPR, MIP and VRT modes. The patient received 95mL OMNIPAQUE  IOHEXOL  350 MG/ML SOLN of contrast.  FINDINGS: Coronary calcium  score: The patient's coronary artery calcium  score is 3084, which places the patient in the 99th percentile.  Coronary arteries: Normal coronary origins.  Co- dominance.  Right Coronary Artery: Normal caliber vessel, gives rise to right PDA. Diffuse mixed calcified and noncalcified plaque throughout primarily proximal vessel. Maximum stenosis 25-49% proximal, 1-24% mid, and 1-24% distal.  Left Main Coronary Artery: Normal caliber vessel. No significant plaque or stenosis.  Left Anterior Descending Coronary Artery: Normal caliber vessel. Diffuse mixed calcified and noncalcified plaque throughout vessel. Maximum stenosis 25-49% proximal, 50-69% mid, and 50-69% distal. Gives rise to two small diagonal branches.  Left  Circumflex Artery: Normal caliber vessel, gives rise to left PDA. Diffuse mixed calcified and noncalcified plaque throughout vessel. Maximum stenosis 25-49% proximal, 25-49% mid, and 50-69% distal. Gives rise to small first, large second, medium third OM branches.  Aorta: Normal size, 35 mm at the mid ascending aorta (level of the PA bifurcation) measured double oblique. Aortic atherosclerosis. No dissection seen in visualized portions of the aorta.  Aortic Valve: Mild calcifications. Trileaflet.  Other findings:  Normal pulmonary vein drainage into the left atrium.  Normal left atrial appendage without a thrombus.  Normal size of the pulmonary artery.  Normal appearance of the pericardium.  IMPRESSION: 1. CAD with at least moderate stenosis, CADRADS = 3. CT FFR will be performed and reported separately. Heavy calcification limits interpretation in certain segments.  2. Coronary calcium  score of 3084. This was 99th percentile for age-, sex-, and race- matched controls.  3. Total plaque volume 1268 mm3 which is 96th percentile for age- and sex- matched controls (calcified plaque 460 mm3; noncalcified plaque 808 mm3). Total plaque volume is extensive.  4. Normal coronary origin with co- dominance.  INTERPRETATION:  CAD-RADS 3: Moderate stenosis (50-69%). Consider symptom-guided anti-ischemic pharmacotherapy as well as risk factor modification per guideline directed care. Additional analysis with CT FFR will be submitted.  Electronically Signed: By: Shelda Bruckner M.D. On: 10/13/2023 14:14     ______________________________________________________________________________________________       Current Reported Medications:.    Current Meds  Medication Sig   ALPRAZolam  (XANAX ) 1 MG tablet TAKE 1/2 TO 1 TABLET BY MOUTH twice daily AS NEEDED FOR ANXIETY   amiodarone  (PACERONE ) 200 MG tablet Take 2 tablets twice per day for 5 days, then take 1 tablet twice per  day for 7 days, then take 1 tablet daily thereafter   aspirin  EC 81 MG tablet Take 1 tablet (81 mg total) by mouth daily. Swallow whole.   atorvastatin  (LIPITOR ) 80 MG tablet Take 1 tablet (80 mg total) by mouth daily.   clopidogrel  (PLAVIX ) 75 MG tablet Take 1 tablet (75 mg total) by mouth daily with breakfast.   docusate sodium  (COLACE) 100 MG capsule Take 1 capsule (100 mg total) by mouth 2 (two) times daily. (Patient taking differently: Take 100 mg by mouth at bedtime.)   ezetimibe  (ZETIA ) 10 MG tablet Take 1 tablet (10 mg total) by mouth daily.   Fe Fum-Vit C-Vit B12-FA (TRIGELS-F FORTE) CAPS capsule Take 1 capsule by mouth daily after breakfast.   lamoTRIgine  (LAMICTAL ) 100 MG tablet TAKE TWO  TABLETS BY MOUTH EVERY MORNING and TAKE ONE TABLET BY MOUTH EVERY EVENING   levothyroxine  (SYNTHROID ) 100 MCG tablet TAKE 1 TABLET BY MOUTH DAILY BEFORE BREAKFAST   losartan  (COZAAR ) 25 MG tablet Take 1 tablet (25 mg total) by mouth daily.   oxyCODONE -acetaminophen  (PERCOCET) 10-325 MG tablet Take 1 tablet by mouth every 6 (six) hours as needed for pain.   polyethylene glycol (MIRALAX  / GLYCOLAX ) 17 g packet Take 17 g by mouth daily as needed for mild constipation.   QUEtiapine  (SEROQUEL ) 50 MG tablet Take 1 tablet (50 mg total) by mouth daily. (Patient taking differently: Take 50 mg by mouth at bedtime.)   traZODone  (DESYREL ) 150 MG tablet TAKE TWO TABLETS BY MOUTH everyday AT bedtime   Vitamin D , Ergocalciferol , (DRISDOL ) 1.25 MG (50000 UNIT) CAPS capsule Take 1 capsule (50,000 Units total) by mouth every 7 (seven) days. Sunday (Patient taking differently: Take 50,000 Units by mouth every Sunday.)   [DISCONTINUED] furosemide  (LASIX ) 40 MG tablet Take 1 tablet (40 mg total) by mouth daily. Take for 2 days then stop   [DISCONTINUED] metoprolol  tartrate (LOPRESSOR ) 50 MG tablet Take 1 tablet (50 mg total) by mouth 2 (two) times daily.   [DISCONTINUED] pantoprazole  (PROTONIX ) 40 MG tablet Take 1 tablet (40  mg total) by mouth daily at 6 (six) AM. (Patient taking differently: Take 40 mg by mouth at bedtime.)   [DISCONTINUED] potassium chloride  SA (KLOR-CON  M) 20 MEQ tablet Take 1 tablet (20 mEq total) by mouth daily. Take for 2 days then stop   Physical Exam:    VS:  BP (!) 140/78   Pulse 70   Ht 5' 4.5 (1.638 m)   Wt 196 lb 8 oz (89.1 kg)   SpO2 98%   BMI 33.21 kg/m    Wt Readings from Last 3 Encounters:  06/10/24 196 lb 8 oz (89.1 kg)  05/25/24 190 lb 3.2 oz (86.3 kg)  05/15/24 190 lb 3.2 oz (86.3 kg)    GEN: Well nourished, well developed in no acute distress NECK: No JVD; No carotid bruits CARDIAC: RRR, no murmurs, rubs, gallops RESPIRATORY:  Clear to auscultation without rales, wheezing or rhonchi  ABDOMEN: Soft, non-tender, non-distended EXTREMITIES:  No edema; No acute deformity     Asessement and Plan:.    CAD: Cardiac catheterization on 04/03/24 Indicated severe three-vessel CAD, had successful PCI/DES to the LCx that was complicated by dissection with recommendations for DAPT with ASA/Plavix  and planned for staged intervention of the LAD and RCA in 2 to 3 weeks.  At heart team meeting on 04/05/2024 after presentation was felt the best option would be to proceed with bypass surgery to the LAD, RCA.  Patient underwent CABG x 2 with left internal mammary artery graft to the LAD and right internal mammary artery graft to the RCA with Dr. Lucas on 05/17/24. Today she reports that she is doing well, notes some midsternal incision discomfort, notes improvement.  She denies any further chest discomfort that she was previous reporting.  She notes some mild dyspnea on exertion that she feels is related to deconditioning, notes improvement with returning home. Surgical sites are clean and intact. Will defer starting of cardiac rehab to T CTS. Check CBC and CMET today. Continue aspirin  81 mg daily, Lipitor  80 mg daily, Plavix  75 mg daily, Zetia  10 mg daily, metoprolol  tartrate 25 mg twice daily.  Reviewed ED precautions.  Postoperative atrial fibrillation: Patient with postoperative atrial fibrillation, continued on amiodarone .  Patient today denies any palpitations  or feeling of irregular heartbeats.  EKG today indicates sinus rhythm.  Will reach out to Dr. Ladona regarding duration of amiodarone  therapy.  Continue amiodarone  200 mg daily, metoprolol  tartrate 25 mg twice daily. Check CBC and CMET today. Check TSH in two weeks.   Hypertension: Blood pressure today 140/78.  Will restart patient's losartan  25 mg daily.  Encourage patient to monitor her blood pressure at home and notify the office if consistently elevated 130/80.  Check BMET today, repeat in two weeks.   Hyperlipidemia: Last lipid profile on 04/24/2024 indicated total cholesterol 130, HDL 49, triglycerides 89 and LDL 64.  Continue Lipitor  80 mg daily and Zetia  10 mg daily.    Cardiac Rehabilitation Eligibility Assessment  The patient is NOT ready to start cardiac rehabilitation due to: Other (Defer to TCTS)    Disposition: F/u with Dr. Ganji or Virlee Stroschein, NP in 8 weeks.   Signed, Thane Age D Lincoln Ginley, NP

## 2024-06-10 ENCOUNTER — Telehealth: Payer: Self-pay

## 2024-06-10 ENCOUNTER — Encounter: Payer: Self-pay | Admitting: Cardiology

## 2024-06-10 ENCOUNTER — Ambulatory Visit: Attending: Cardiology | Admitting: Cardiology

## 2024-06-10 VITALS — BP 140/78 | HR 70 | Ht 64.5 in | Wt 196.5 lb

## 2024-06-10 DIAGNOSIS — I480 Paroxysmal atrial fibrillation: Secondary | ICD-10-CM

## 2024-06-10 DIAGNOSIS — I2510 Atherosclerotic heart disease of native coronary artery without angina pectoris: Secondary | ICD-10-CM | POA: Diagnosis not present

## 2024-06-10 DIAGNOSIS — E782 Mixed hyperlipidemia: Secondary | ICD-10-CM | POA: Diagnosis not present

## 2024-06-10 DIAGNOSIS — Z951 Presence of aortocoronary bypass graft: Secondary | ICD-10-CM

## 2024-06-10 DIAGNOSIS — I10 Essential (primary) hypertension: Secondary | ICD-10-CM | POA: Diagnosis not present

## 2024-06-10 MED ORDER — METOPROLOL TARTRATE 25 MG PO TABS: 25.0000 mg | ORAL_TABLET | Freq: Two times a day (BID) | ORAL | 3 refills | Status: AC

## 2024-06-10 MED ORDER — PANTOPRAZOLE SODIUM 40 MG PO TBEC: 40.0000 mg | DELAYED_RELEASE_TABLET | Freq: Every day | ORAL | 0 refills | Status: DC

## 2024-06-10 MED ORDER — LOSARTAN POTASSIUM 25 MG PO TABS: 25.0000 mg | ORAL_TABLET | Freq: Every day | ORAL | 3 refills | Status: DC

## 2024-06-10 NOTE — Telephone Encounter (Signed)
 Okay to give verbal orders?   Copied from CRM [id number]. Topic: Clinical - Home Health Verbal Orders >> Jun 10, 2024 10:56 AM Adelita E wrote: Caller/Agency: Rochelle with [facility] Callback Number: [phone] Service Requested: Physical Therapy Frequency: 1x/week for 1 week, then 2x/week for 4 weeks Any new concerns about the patient? No

## 2024-06-10 NOTE — Telephone Encounter (Signed)
Yes ok for verbal orders

## 2024-06-10 NOTE — Patient Instructions (Signed)
 Medication Instructions:   START TAKING :  METOPROLOL  25 MG TWICE  A DAY   START TAKING :  PROTONIX   40 MG  ONCE  A DAY     START TAKING :  LOSARTAN   25 NG ONCE   A  DAY    *If you need a refill on your cardiac medications before your next appointment, please call your pharmacy*    Lab Work:  PLEASE GO DOWN STAIRS  [facility]  FIRST FLOOR   ( GET OFF ELEVATORS WALK TOWARDS WAITING AREA LAB LOCATED BY PHARMACY):  CMET AND CBC TODAY   RETURN IN 2 WEEKS  BMET  AND TSH     If you have labs (blood work) drawn today and your tests are completely normal, you will receive your results only by: MyChart Message (if you have MyChart) OR A paper copy in the mail If you have any lab test that is abnormal or we need to change your treatment, we will call you to review the results.    Testing/Procedures: NONE ORDERED  TODAY     Follow-Up: At [facility], you and your health needs are our priority.  As part of our continuing mission to provide you with exceptional heart care, our providers are all part of one team.  This team includes your primary Cardiologist (physician) and Advanced Practice Providers or APPs (Physician Assistants and Nurse Practitioners) who all work together to provide you with the care you need, when you need it.  Your next appointment:   8 week(s)   Provider:    Gordy Bergamo, MD or Katlyn West, NP         We recommend signing up for the patient portal called MyChart.  Sign up information is provided on this After Visit Summary.  MyChart is used to connect with patients for Virtual Visits (Telemedicine).  Patients are able to view lab/test results, encounter notes, upcoming appointments, etc.  Non-urgent messages can be sent to your provider as well.   To learn more about what you can do with MyChart, go to [email or web address].    Other Instructions:

## 2024-06-11 ENCOUNTER — Encounter: Payer: Self-pay | Admitting: Cardiology

## 2024-06-11 ENCOUNTER — Other Ambulatory Visit: Payer: Self-pay | Admitting: Surgery

## 2024-06-11 ENCOUNTER — Ambulatory Visit: Payer: Self-pay | Admitting: Cardiology

## 2024-06-11 DIAGNOSIS — I2510 Atherosclerotic heart disease of native coronary artery without angina pectoris: Secondary | ICD-10-CM

## 2024-06-11 LAB — COMPREHENSIVE METABOLIC PANEL WITH GFR
ALT: 7 IU/L (ref 0–32)
AST: 22 IU/L (ref 0–40)
Albumin: 4.2 g/dL (ref 3.8–4.8)
Alkaline Phosphatase: 108 IU/L (ref 49–135)
BUN/Creatinine Ratio: 11 — AB (ref 12–28)
BUN: 10 mg/dL (ref 8–27)
Bilirubin Total: 0.2 mg/dL (ref 0.0–1.2)
CO2: 19 mmol/L — AB (ref 20–29)
Calcium: 8.9 mg/dL (ref 8.7–10.3)
Chloride: 103 mmol/L (ref 96–106)
Creatinine, Ser: 0.94 mg/dL (ref 0.57–1.00)
Globulin, Total: 2 g/dL (ref 1.5–4.5)
Glucose: 92 mg/dL (ref 70–99)
Potassium: 5.1 mmol/L (ref 3.5–5.2)
Sodium: 138 mmol/L (ref 134–144)
Total Protein: 6.2 g/dL (ref 6.0–8.5)
eGFR: 64 mL/min/1.73 (ref >59)

## 2024-06-11 LAB — CBC
Hematocrit: 31.1 % — ABNORMAL LOW (ref 34.0–46.6)
Hemoglobin: 9.4 g/dL — ABNORMAL LOW (ref 11.1–15.9)
MCH: 29.5 pg (ref 26.6–33.0)
MCHC: 30.2 g/dL — ABNORMAL LOW (ref 31.5–35.7)
MCV: 98 fL — ABNORMAL HIGH (ref 79–97)
Platelets: 344 x10E3/uL (ref 150–450)
RBC: 3.19 x10E6/uL — ABNORMAL LOW (ref 3.77–5.28)
RDW: 13.5 % (ref 11.7–15.4)
WBC: 6 x10E3/uL (ref 3.4–10.8)

## 2024-06-11 NOTE — Telephone Encounter (Signed)
 Called back Becky Lawson. She said that they will send reports to review and sign.

## 2024-06-12 ENCOUNTER — Ambulatory Visit (HOSPITAL_COMMUNITY)
Admission: RE | Admit: 2024-06-12 | Discharge: 2024-06-12 | Disposition: A | Discharge status: Home / Self Care | Source: Ambulatory Visit | Attending: Cardiology | Admitting: Cardiology

## 2024-06-12 ENCOUNTER — Ambulatory Visit: Payer: Self-pay

## 2024-06-12 VITALS — BP 142/78 | HR 63 | Resp 18 | Ht 64.5 in | Wt 196.0 lb

## 2024-06-12 DIAGNOSIS — I2510 Atherosclerotic heart disease of native coronary artery without angina pectoris: Secondary | ICD-10-CM | POA: Diagnosis not present

## 2024-06-12 DIAGNOSIS — Z951 Presence of aortocoronary bypass graft: Secondary | ICD-10-CM

## 2024-06-12 DIAGNOSIS — I700 Atherosclerosis of aorta: Secondary | ICD-10-CM | POA: Diagnosis not present

## 2024-06-12 DIAGNOSIS — J90 Pleural effusion, not elsewhere classified: Secondary | ICD-10-CM | POA: Diagnosis not present

## 2024-06-12 MED ORDER — FAMOTIDINE 20 MG PO TABS: 20.0000 mg | ORAL_TABLET | Freq: Every day | ORAL | 0 refills | Status: DC

## 2024-06-12 NOTE — Patient Instructions (Signed)
 You may return to driving an automobile as long as you are no longer requiring oral narcotic pain relievers during the daytime.  It would be wise to start driving only short distances during the daylight and gradually increase from there as you feel comfortable.   You may continue to gradually increase your physical activity as tolerated.  Refrain from any heavy lifting or strenuous use of your arms and shoulders until at least 6 weeks from the time of your surgery, and avoid activities that cause increased pain in your chest on the side of your surgical incision.  Otherwise you may continue to increase activities without any particular limitations.  Increase the intensity and duration of physical activity gradually.

## 2024-06-12 NOTE — Progress Notes (Signed)
 [address] [place] [address]             [phone]       HPI:  Becky Lawson is a 73 year old female with medical history of hypertension, GERD, fatty liver, hypothyroidism, s/p partial thyroidectomy, bipolar, prediabetes and hyperlipidemia who  returns for routine postoperative follow-up having undergone Coronary artery bypass grafting x 2, Left internal mammary artery graft to the LAD and right internal mammary artery graft to the RCA on 05/17/2024 with Dr. Lucas.  The patient's early postoperative recovery while in the hospital was notable for developing atrial fibrillation with RVR. She was started on IV amiodarone  and was transitioned to PO form. She was started on plavix . She was stable for discharge to SNF on 05/25/2024.   Since hospital discharge the patient reports that she has been doing well.  While at the SNF she did have a fall and bruised her coccyx. She has since been discharged home.  She has been having some left ankle pain along her medial malleolus for one week. The pain is mainly when she is walking.  She denies injury to her ankle.  She does take oxycodone  for chronic pain and this helps her left ankle.  Incision site on left ankle is healing without erythema, drainage or discharge.  She has no incisional chest pain at this time.  She has been feeling fatigued and has not been up and moving.  She starts home physical therapy tomorrow.  She denies chest pain, shortness of breath.    Allergies as of 06/12/2024   No Known Allergies      Medication List        Accurate as of June 12, 2024 12:07 PM. If you have any questions, ask your nurse or doctor.          ALPRAZolam  1 MG tablet Commonly known as: XANAX  TAKE 1/2 TO 1 TABLET BY MOUTH twice daily AS NEEDED FOR ANXIETY   amiodarone  200 MG tablet Commonly known as: PACERONE  Take 2 tablets twice per day for 5 days, then take 1 tablet twice per day for 7 days, then take 1 tablet  daily thereafter   aspirin  EC 81 MG tablet Take 1 tablet (81 mg total) by mouth daily. Swallow whole.   atorvastatin  80 MG tablet Commonly known as: LIPITOR  Take 1 tablet (80 mg total) by mouth daily.   clopidogrel  75 MG tablet Commonly known as: PLAVIX  Take 1 tablet (75 mg total) by mouth daily with breakfast.   docusate sodium  100 MG capsule Commonly known as: COLACE Take 1 capsule (100 mg total) by mouth 2 (two) times daily. What changed: when to take this   ezetimibe  10 MG tablet Commonly known as: ZETIA  Take 1 tablet (10 mg total) by mouth daily.   famotidine 20 MG tablet Commonly known as: PEPCID Take 1 tablet (20 mg total) by mouth daily.   Fe Fum-Vit C-Vit B12-FA Caps capsule Commonly known as: TRIGELS-F FORTE Take 1 capsule by mouth daily after breakfast.   lamoTRIgine  100 MG tablet Commonly known as: LAMICTAL  TAKE TWO TABLETS BY MOUTH EVERY MORNING and TAKE ONE TABLET BY MOUTH EVERY EVENING   levothyroxine  100 MCG tablet Commonly known as: SYNTHROID  TAKE 1 TABLET BY MOUTH DAILY BEFORE BREAKFAST   losartan  25 MG tablet Commonly known as: COZAAR  Take 1 tablet (25 mg total) by mouth daily.   metoprolol  tartrate 25 MG tablet Commonly known  as: LOPRESSOR  Take 1 tablet (25 mg total) by mouth 2 (two) times daily.   oxyCODONE -acetaminophen  10-325 MG tablet Commonly known as: PERCOCET Take 1 tablet by mouth every 6 (six) hours as needed for pain.   pantoprazole  40 MG tablet Commonly known as: PROTONIX  Take 1 tablet (40 mg total) by mouth daily.   polyethylene glycol 17 g packet Commonly known as: MIRALAX  / GLYCOLAX  Take 17 g by mouth daily as needed for mild constipation.   QUEtiapine  50 MG tablet Commonly known as: SEROQUEL  Take 1 tablet (50 mg total) by mouth daily. What changed: when to take this   traZODone  150 MG tablet Commonly known as: DESYREL  TAKE TWO TABLETS BY MOUTH everyday AT bedtime   Vitamin D  (Ergocalciferol ) 1.25 MG (50000 UNIT)  Caps capsule Commonly known as: DRISDOL  Take 1 capsule (50,000 Units total) by mouth every 7 (seven) days. Sunday What changed:  when to take this additional instructions         ROS Review of Systems  Constitutional:  Positive for malaise/fatigue.  Respiratory: Negative.  Negative for cough and shortness of breath.   Cardiovascular: Negative.  Negative for chest pain and leg swelling.  Musculoskeletal:  Positive for joint pain.       Left ankle pain      BP (!) 142/78   Pulse 63   Resp 18   Ht 5' 4.5 (1.638 m)   Wt 196 lb (88.9 kg)   SpO2 96%   BMI 33.12 kg/m   Physical Exam Constitutional:      Appearance: Normal appearance.  HENT:     Head: Normocephalic and atraumatic.  Cardiovascular:     Rate and Rhythm: Normal rate and regular rhythm.     Heart sounds: Normal heart sounds, S1 normal and S2 normal.  Pulmonary:     Effort: Pulmonary effort is normal.     Breath sounds: Normal breath sounds.  Musculoskeletal:     Cervical back: Normal range of motion.     Left ankle: No deformity. Tenderness present. Normal range of motion.     Comments: Tenderness along medial malleolus, slight swelling, no erythema at site or along incision, no bruising  Some pain with plantar flexion and dorsiflexion  Skin:    General: Skin is warm and dry.      Neurological:     General: No focal deficit present.     Mental Status: She is alert.       Imaging: EXAM: 2 VIEW(S) XRAY OF THE CHEST 06/12/2024 09:56:36 AM   COMPARISON: 05/20/2024.   CLINICAL HISTORY: CABG.   FINDINGS:   LINES, TUBES AND DEVICES: Right IJ sheath removed.   LUNGS AND PLEURA: Small bilateral pleural effusions and adjacent passive atelectasis.   HEART AND MEDIASTINUM: Aortic arch atherosclerosis.   BONES AND SOFT TISSUES: Lower thoracic spondylosis.   IMPRESSION: 1. Small bilateral pleural effusions with adjacent passive atelectasis. 2. Aortic arch atherosclerosis. 3. Lower thoracic  spondylosis.   Electronically signed by: Ryan Salvage MD 06/12/2024 10:23 AM EDT RP Workstation: [id number]   Assessment/Plan: -We reviewed today's chest x ray. She has small bilateral pleural effusions.  We discussed driving and she is able to start. First time driving should be a short distance in the day time and she can increase slowly.  Discussed not driving while taking her oxycodone  for chronic pain. Discussed participation in cardiac rehab and she is cleared at this time.  She has home PT starting this week.  Discussed the continuance of  sternal precautions until a full 6 weeks from surgery.  She is to slowly increase her activity/exercise as tolerated. Continue to use incentive spirometer.  -Discussed left ankle pain and she understands to follow up with her PCP or urgent care. She can try using ice to help with pain and keep legs elevated. Wear a supportive shoe while walking around. Alarm symptom discuss and she understands to be evaluated if she starts to have inability to walk/bear weight, erythema at site, increase swelling, fever or increased pain -She has been seen by cardiology and she is to continue aspirin , lipitor , plavix , zetia ,amiodarone  and metoprolol .  She was restarted on losartan  for HTN, which she has not taken today.  Continue to record blood pressure readings.   -Discontinued pantoprazole  and started pepcid for acid reflux.  Sent to pharmacy for patient to try -Follow up in one month with chest xray for small pleural effusions   Manuelita CHRISTELLA Rough, PA-C 12:07 PM 06/12/24

## 2024-06-13 ENCOUNTER — Other Ambulatory Visit: Payer: Self-pay

## 2024-06-13 MED ORDER — FUROSEMIDE 40 MG PO TABS: 40.0000 mg | ORAL_TABLET | Freq: Every day | ORAL | 0 refills | Status: DC

## 2024-06-13 MED ORDER — POTASSIUM CHLORIDE CRYS ER 20 MEQ PO TBCR: 20.0000 meq | EXTENDED_RELEASE_TABLET | Freq: Every day | ORAL | 0 refills | Status: DC

## 2024-06-13 NOTE — Progress Notes (Signed)
-  Sent in furosemide  40 mg and potassium 20 mg for edema -Reviewed labs from 10/06 and kidney function normal -Keep follow up as scheduled

## 2024-06-16 ENCOUNTER — Telehealth: Payer: Self-pay | Admitting: Cardiology

## 2024-06-16 NOTE — Telephone Encounter (Signed)
 Please let Becky Lawson know that I spoke with Dr. Ladona she can stop her amiodarone  on 10/20. She should notify the office is she has any increased palpitations or feeling or irregular rhythms. Thank you!

## 2024-06-17 NOTE — Telephone Encounter (Signed)
 Spoke with patient aware of  recommendations with changes on Amiodarone  with calling office  back with any up dates .Epic med list updated.

## 2024-06-25 ENCOUNTER — Telehealth (HOSPITAL_COMMUNITY): Payer: Self-pay

## 2024-06-25 NOTE — Telephone Encounter (Signed)
 Attempted to call patient to see if home PT has been completed to schedule cardiac rehab- no answer, left message. Sent MyChart message.

## 2024-06-26 ENCOUNTER — Telehealth (HOSPITAL_COMMUNITY): Payer: Self-pay

## 2024-06-26 ENCOUNTER — Encounter (HOSPITAL_COMMUNITY): Payer: Self-pay

## 2024-06-26 NOTE — Telephone Encounter (Signed)
 Pt returned phone call and stated that she is interested in the cardiac rehab program. Pt will come in for orientation on 11/13@1 :15 and will attend the 1230 exercise class.   Sent packet

## 2024-07-02 ENCOUNTER — Telehealth: Payer: Self-pay | Admitting: Family Medicine

## 2024-07-02 NOTE — Telephone Encounter (Signed)
 Home Health Certification or Plan of Care Tracking  Is this a Certification or Plan of Care? Plan of Care  [facility] Agency: [facility]  Order Number:  [id number]  Has charge sheet been attached? Yes  Where has form been placed:   Labeled & placed in provider bin

## 2024-07-03 ENCOUNTER — Telehealth: Payer: Self-pay | Admitting: Cardiology

## 2024-07-03 NOTE — Telephone Encounter (Signed)
 Obtained forms and placed in folder in at nurse station

## 2024-07-03 NOTE — Telephone Encounter (Signed)
 Pt c/o BP issue: STAT if pt c/o blurred vision, one-sided weakness or slurred speech  1. What are your last 5 BP readings? 164/82;   2. Are you having any other symptoms (ex. Dizziness, headache, blurred vision, passed out)? Right sided jaw pain that spread to her shoulder. It was realize with 2 dosage of Nitiro  3. What is your BP issue?

## 2024-07-03 NOTE — Telephone Encounter (Signed)
 Patient identification verified by 2 forms.   Called and spoke to patient  Patient states:  -The event occurred Monday -Friday, Saturday, Sunday had pain.  -Monday pt had PT. Was advised to take Nitroglycerin , the pain was relieved.  -Pt has not had the issue since Monday.   Patient denies:  -Additional episodes since Monday.     Interventions/Plan: -Will keep her appointment as scheduled.   Reviewed ED warning signs/precautions  Patient agrees with plan, no questions at this time

## 2024-07-03 NOTE — Telephone Encounter (Signed)
 Paperwork completed and placed in fax bin at back nurse station

## 2024-07-04 NOTE — Telephone Encounter (Signed)
 Faxed back to requested number, no charge per provider flow sheet

## 2024-07-15 ENCOUNTER — Other Ambulatory Visit: Payer: Self-pay | Admitting: Surgery

## 2024-07-15 ENCOUNTER — Other Ambulatory Visit: Payer: Self-pay | Admitting: Family Medicine

## 2024-07-15 DIAGNOSIS — R7989 Other specified abnormal findings of blood chemistry: Secondary | ICD-10-CM

## 2024-07-15 DIAGNOSIS — Z951 Presence of aortocoronary bypass graft: Secondary | ICD-10-CM

## 2024-07-16 ENCOUNTER — Ambulatory Visit: Payer: Self-pay

## 2024-07-16 ENCOUNTER — Other Ambulatory Visit (HOSPITAL_COMMUNITY)

## 2024-07-16 ENCOUNTER — Ambulatory Visit (HOSPITAL_COMMUNITY)
Admission: RE | Admit: 2024-07-16 | Discharge: 2024-07-16 | Disposition: A | Discharge status: Home / Self Care | Source: Ambulatory Visit | Attending: Cardiovascular Disease | Admitting: Cardiovascular Disease

## 2024-07-16 VITALS — BP 125/68 | HR 80 | Resp 20 | Ht 64.5 in | Wt 195.0 lb

## 2024-07-16 DIAGNOSIS — Z951 Presence of aortocoronary bypass graft: Secondary | ICD-10-CM | POA: Diagnosis not present

## 2024-07-16 DIAGNOSIS — I2510 Atherosclerotic heart disease of native coronary artery without angina pectoris: Secondary | ICD-10-CM

## 2024-07-16 DIAGNOSIS — J90 Pleural effusion, not elsewhere classified: Secondary | ICD-10-CM

## 2024-07-16 NOTE — Patient Instructions (Signed)
Follow up with clinic as needed

## 2024-07-16 NOTE — Progress Notes (Signed)
 [address] [place] [address]             [phone]       HPI:  Becky Lawson is a 73 year old female with medical history of hypertension, GERD, fatty liver, hypothyroidism, s/p partial thyroidectomy, bipolar, prediabetes and hyperlipidemia who  returns for routine postoperative follow-up having undergone Coronary artery bypass grafting x 2, Left internal mammary artery graft to the LAD and right internal mammary artery graft to the RCA on 05/17/2024 with Dr. Lucas.   She presents to the clinic today for continued follow up. At last visit she had bilateral small pleural effusions.  She took lasix  for 7 days. She reports that she has been doing very well.  She has been able to be more active.  Her left ankle is not longer bothering her.  She has lost 20 pounds since surgery.  She does report having jaw pain, headache that moved behind her eye recently.  She states that this has happened in the past and this has only occurred once since surgery.  She took oxycodone  which relieved the symptoms. She denies current chest pain, shortness of breath and lower leg swelling.      Allergies as of 07/16/2024   No Known Allergies      Medication List        Accurate as of July 16, 2024  3:00 PM. If you have any questions, ask your nurse or doctor.          STOP taking these medications    furosemide  40 MG tablet Commonly known as: LASIX    potassium chloride  SA 20 MEQ tablet Commonly known as: KLOR-CON  M       TAKE these medications    ALPRAZolam  1 MG tablet Commonly known as: XANAX  TAKE 1/2 TO 1 TABLET BY MOUTH twice daily AS NEEDED FOR ANXIETY   aspirin  EC 81 MG tablet Take 1 tablet (81 mg total) by mouth daily. Swallow whole.   atorvastatin  80 MG tablet Commonly known as: LIPITOR  Take 1 tablet (80 mg total) by mouth daily.   clopidogrel  75 MG tablet Commonly known as: PLAVIX  Take 1 tablet (75 mg total) by mouth daily with  breakfast.   docusate sodium  100 MG capsule Commonly known as: COLACE Take 1 capsule (100 mg total) by mouth 2 (two) times daily. What changed: when to take this   ezetimibe  10 MG tablet Commonly known as: ZETIA  Take 1 tablet (10 mg total) by mouth daily.   famotidine 20 MG tablet Commonly known as: PEPCID Take 1 tablet (20 mg total) by mouth daily.   Fe Fum-Vit C-Vit B12-FA Caps capsule Commonly known as: TRIGELS-F FORTE Take 1 capsule by mouth daily after breakfast.   lamoTRIgine  100 MG tablet Commonly known as: LAMICTAL  TAKE TWO TABLETS BY MOUTH EVERY MORNING and TAKE ONE TABLET BY MOUTH EVERY EVENING   levothyroxine  100 MCG tablet Commonly known as: SYNTHROID  TAKE 1 TABLET BY MOUTH DAILY BEFORE BREAKFAST   losartan  25 MG tablet Commonly known as: COZAAR  Take 1 tablet (25 mg total) by mouth daily.   metoprolol  tartrate 25 MG tablet Commonly known as: LOPRESSOR  Take 1 tablet (25 mg total) by mouth 2 (two) times daily.   oxyCODONE -acetaminophen  10-325 MG tablet Commonly known as: PERCOCET Take 1 tablet by mouth every 6 (six) hours as needed for pain.   pantoprazole  40 MG tablet Commonly known as: PROTONIX  Take 1 tablet (40  mg total) by mouth daily.   polyethylene glycol 17 g packet Commonly known as: MIRALAX  / GLYCOLAX  Take 17 g by mouth daily as needed for mild constipation.   QUEtiapine  50 MG tablet Commonly known as: SEROQUEL  Take 1 tablet (50 mg total) by mouth daily. What changed: when to take this   traZODone  150 MG tablet Commonly known as: DESYREL  TAKE TWO TABLETS BY MOUTH everyday AT bedtime   Vitamin D  (Ergocalciferol ) 1.25 MG (50000 UNIT) Caps capsule Commonly known as: DRISDOL  Take 1 capsule (50,000 Units total) by mouth every 7 (seven) days. Sunday What changed:  when to take this additional instructions         ROS Review of Systems  Constitutional:  Negative for malaise/fatigue.  Respiratory:  Negative for cough and shortness of  breath.   Cardiovascular:  Negative for chest pain, palpitations and leg swelling.  Neurological:  Positive for dizziness.      BP 125/68   Pulse 80   Resp 20   Ht 5' 4.5 (1.638 m)   Wt 195 lb (88.5 kg)   SpO2 93% Comment: RA  BMI 32.95 kg/m   Physical Exam Constitutional:      Appearance: Normal appearance.  HENT:     Head: Normocephalic and atraumatic.  Cardiovascular:     Rate and Rhythm: Normal rate and regular rhythm.     Heart sounds: Normal heart sounds, S1 normal and S2 normal.  Pulmonary:     Effort: Pulmonary effort is normal.     Breath sounds: Normal breath sounds.  Musculoskeletal:     Cervical back: Normal range of motion.  Skin:    General: Skin is warm and dry.     Comments: All incision sites healed without erythema, drainage or swelling  Neurological:     General: No focal deficit present.     Mental Status: She is alert.       Imaging: CLINICAL DATA:  CABG.   EXAM: CHEST - 2 VIEW   COMPARISON:  06/12/2024   FINDINGS: Sternotomy wires unchanged. Lungs are adequately inflated without focal airspace consolidation or effusion. Cardiomediastinal silhouette and remainder of the exam is unchanged.   IMPRESSION: No active cardiopulmonary disease.     Electronically Signed   By: Toribio Agreste M.D.   On: 07/16/2024 14:0   Assessment/Plan: -We reviewed today's chest x ray. Bilateral pleural effusions resolved at this time -She starts cardiac rehab Thursday -Note provided for patient to return to silver sneakers classes at this time -It has been 6 weeks from surgery and sternal precautions can be lifted.  She should continue to avoid lifting over 10 pounds until a full 3 months from surgery  -Discussed jaw pain, headache with movement to behind her eye.  This is not currently happening during today's visit. Discussed that she should continue to monitor this.  She should address with PCP for further evaluation. Alarm symptoms discussed and she  states understanding of proper follow up -She has been seen by cardiology and she is to continue aspirin , lipitor , plavix , zetia , losartan  and metoprolol . Continue to record blood pressure readings.    -Follow up with [facility] as needed  Manuelita CHRISTELLA Rough, PA-C 3:00 PM 07/16/24

## 2024-07-17 ENCOUNTER — Telehealth (HOSPITAL_COMMUNITY): Payer: Self-pay

## 2024-07-17 NOTE — Telephone Encounter (Signed)
 Attempted to confirm cardiac rehab orientation date of 07/18/24 @ 1315.  Vm left.

## 2024-07-18 ENCOUNTER — Encounter (HOSPITAL_COMMUNITY)
Admission: RE | Admit: 2024-07-18 | Discharge: 2024-07-18 | Disposition: A | Discharge status: Home / Self Care | Source: Ambulatory Visit | Attending: Cardiology | Admitting: Cardiology

## 2024-07-18 VITALS — BP 142/70 | HR 69 | Ht 64.5 in | Wt 183.0 lb

## 2024-07-18 DIAGNOSIS — Z951 Presence of aortocoronary bypass graft: Secondary | ICD-10-CM | POA: Diagnosis not present

## 2024-07-18 DIAGNOSIS — Z48812 Encounter for surgical aftercare following surgery on the circulatory system: Secondary | ICD-10-CM | POA: Insufficient documentation

## 2024-07-18 NOTE — Progress Notes (Signed)
 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 Swallow whole., Disp: 30 tablet, Rfl: 12   atorvastatin  (LIPITOR ) 80 MG tablet, Take 1 tablet (80 mg total) by mouth daily., Disp: 90 tablet, Rfl: 3   clopidogrel  (PLAVIX ) 75 MG tablet, Take 1 tablet (75 mg total) by mouth daily with breakfast., Disp: 90 tablet, Rfl: 3   docusate sodium  (COLACE) 100 MG capsule, Take 1 capsule (100 mg total) by mouth 2 (two) times daily., Disp: 10 capsule, Rfl: 0   ezetimibe  (ZETIA ) 10 MG tablet, Take 1 tablet (10 mg total) by mouth daily., Disp: 90 tablet, Rfl: 3   famotidine (PEPCID) 20 MG tablet, Take 1 tablet (20 mg total) by mouth daily., Disp: 90 tablet, Rfl: 0   lamoTRIgine  (LAMICTAL ) 100 MG tablet, TAKE TWO TABLETS BY MOUTH EVERY MORNING and TAKE ONE TABLET BY MOUTH EVERY EVENING, Disp: 270 tablet, Rfl: 1   levothyroxine  (SYNTHROID ) 100 MCG tablet, TAKE 1 TABLET BY MOUTH DAILY BEFORE BREAKFAST, Disp: 90 tablet, Rfl: 1   losartan  (COZAAR ) 25 MG tablet, Take 1 tablet (25 mg total) by mouth daily., Disp: 90 tablet, Rfl: 3   metoprolol  tartrate (LOPRESSOR ) 25 MG tablet, Take 1 tablet (25 mg total) by mouth 2 (two) times daily., Disp: 180 tablet, Rfl: 3    oxyCODONE -acetaminophen  (PERCOCET) 10-325 MG tablet, Take 1 tablet by mouth every 6 (six) hours as needed for pain., Disp: 28 tablet, Rfl: 0   polyethylene glycol (MIRALAX  / GLYCOLAX ) 17 g packet, Take 17 g by mouth daily as needed for mild constipation., Disp: 14 each, Rfl: 0   traZODone  (DESYREL ) 150 MG tablet, TAKE TWO TABLETS BY MOUTH everyday AT bedtime, Disp: 180 tablet, Rfl: 1   Fe Fum-Vit C-Vit B12-FA (TRIGELS-F FORTE) CAPS capsule, Take 1 capsule by mouth daily after breakfast., Disp: , Rfl:    QUEtiapine  (SEROQUEL ) 50 MG tablet, Take 1 tablet (50 mg total) by mouth daily. (Patient taking differently: Take 50 mg by mouth at bedtime.), Disp: 90 tablet, Rfl: 1   Vitamin D , Ergocalciferol , (DRISDOL ) 1.25 MG (50000 UNIT) CAPS capsule, Take 1 capsule (50,000 Units total) by mouth every 7 (seven) days. Sunday (Patient taking differently: Take 50,000 Units by mouth every Sunday.), Disp: 12 capsule, Rfl: 3  Past Medical History: Past Medical History:  Diagnosis Date   Anxiety    Arthritis    Benign cyst of right kidney 2014   2.7 cm right kidney cyst seen on imaging 2014 but was c/o right flank pain with new persistent proteinuria (fortunately hematuria had resolved) so repeat US  11/2016 showed right kidney still with 2.6 cm simple cyst and otherwise nml.   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, genotype 1b, followed at [facility]   Hyperlipidemia    Hypertension    Hypothyroidism    Jaundice 11/01/2012   Pill dysphagia 10/20/2021   Pruritic disorder 02/13/2013   Sleep apnea    Was diagnosed approximately 20 years ago, but does not wear CPAP patient stated I gave it back.SABRASABRAI couldn't wear that thing it was horrible   Spinal stenosis    Thyroid  disease    [employer]  06/15/2020   Unintentional weight loss of more than 10 pounds 07/12/2018    Tobacco Use: Social History   Tobacco Use  Smoking Status Former   Current packs/day: 0.00   Average packs/day: 1 pack/day for 15.0 years (15.0 ttl pk-yrs)   Types: Cigarettes   Start date: 06/29/1964   Quit date: 06/30/1979   Years since quitting: 45.0  Smokeless Tobacco Never    Labs: Review Flowsheet  More data exists      Latest Ref Rng & Units 05/12/2023 09/26/2023 04/24/2024 05/15/2024 05/17/2024  Labs for ITP Cardiac and Pulmonary Rehab  Cholestrol 100 - 199 mg/dL 850  - 869  - -  LDL (calc) 0 - 99 mg/dL 73  - 64  - -  HDL-C >60 mg/dL 49.29  - 49  - -  Trlycerides 0 - 149 mg/dL 875.9  - 89  - -  Hemoglobin A1c 4.8 - 5.6 % - 6.0  - 5.2  -  PH, Arterial 7.35 - 7.45 - - - - 7.306  7.290  7.299  7.404  7.410  7.447  7.305   PCO2 arterial 32 - 48 mmHg - - - - 46.8  45.8  45.1  35.6  36.1  32.4  38.2   Bicarbonate 20.0 - 28.0 mmol/L - - - - 23.3  22.1  22.3  22.8  22.9  23.3  22.4  19.0   TCO2 22 - 32 mmol/L - - - - 25  24  24  24  24  25  26  24  23  23  20  25    Acid-base deficit 0.0 - 2.0 mmol/L - - - - 3.0  4.0  4.0  2.0  2.0  1.0  1.0  7.0   O2 Saturation % - - - - 97  98  98  99  100  80  100  100     Details       Multiple values from one day are sorted in reverse-chronological order         Capillary Blood Glucose: Lab Results  Component Value Date   GLUCAP 105 (H) 05/24/2024   GLUCAP 112 (H) 05/24/2024   GLUCAP 106 (H) 05/21/2024   GLUCAP 113 (H) 05/21/2024   GLUCAP 117 (H) 05/21/2024     Exercise Target Goals: Exercise Program Goal: Individual exercise prescription set using results from initial 6 min walk test and THRR while considering  patient's activity barriers and safety.   Exercise Prescription Goal: Initial exercise prescription builds to 30-45 minutes a day of aerobic activity, 2-3 days per week.  Home exercise guidelines will be given to patient during program as  part of exercise prescription that the participant will acknowledge.  Activity Barriers & Risk Stratification:  Activity Barriers & Cardiac Risk Stratification - 07/18/24  1540       Activity Barriers & Cardiac Risk Stratification   Activity Barriers Balance Concerns;History of Falls;Joint Problems;Arthritis;Back Problems;Deconditioning;Muscular [employer] Device;Shortness of Breath    Cardiac Risk Stratification High          6 Minute Walk:  6 Minute Walk     Row Name 07/18/24 1447         6 Minute Walk   Phase Initial  Pt used go-cart for stability     Distance 1003 feet     Walk Time 6 minutes     # of Rest Breaks 0     MPH 1.9     METS 2.21     RPE 9     Perceived Dyspnea  0.5     VO2 Peak 7.73     Symptoms Yes (comment)     Comments 7/10 chronic rt low back/hip pain     Resting HR 69 bpm     Resting BP 142/70     Max Ex. HR 103 bpm     Max Ex. BP 156/84     2 Minute Post BP 130/78        Oxygen Initial Assessment:   Oxygen Re-Evaluation:   Oxygen Discharge (Final Oxygen Re-Evaluation):   Initial Exercise Prescription:  Initial Exercise Prescription - 07/18/24 1500       Date of Initial Exercise RX and Referring Provider   Date 07/18/24    Referring Provider Gordy Bergamo, MD    Expected Discharge Date 10/09/24      NuStep   Level 1    SPM 75    Minutes 15    METs 2.2      Prescription Details   Frequency (times per week) 3    Duration Progress to 30 minutes of continuous aerobic without signs/symptoms of physical distress      Intensity   THRR 40-80% of Max Heartrate 59-118    Ratings of Perceived Exertion 11-13    Perceived Dyspnea 0-4      Progression   Progression Continue progressive overload as per policy without signs/symptoms or physical distress.      Resistance Training   Training Prescription Yes    Weight 2 lbs    Reps 10-15          Perform Capillary Blood Glucose checks as needed.  Exercise Prescription  Changes:   Exercise Comments:   Exercise Goals and Review:   Exercise Goals     Row Name 07/18/24 1541             Exercise Goals   Increase Physical Activity Yes       Intervention Provide advice, education, support and counseling about physical activity/exercise needs.;Develop an individualized exercise prescription for aerobic and resistive training based on initial evaluation findings, risk stratification, comorbidities and participant's personal goals.       Expected Outcomes Short Term: Attend rehab on a regular basis to increase amount of physical activity.;Long Term: Add in home exercise to make exercise part of routine and to increase amount of physical activity.;Long Term: Exercising regularly at least 3-5 days a week.       Increase Strength and Stamina Yes       Intervention Provide advice, education, support and counseling about physical activity/exercise needs.;Develop an individualized exercise prescription for aerobic and resistive training based on initial evaluation findings, risk stratification, comorbidities and participant's personal goals.       Expected Outcomes Short Term: Increase workloads from initial  exercise prescription for resistance, speed, and METs.;Short Term: Perform resistance training exercises routinely during rehab and add in resistance training at home;Long Term: Improve cardiorespiratory fitness, muscular endurance and strength as measured by increased METs and functional capacity ( )       Able to understand and use rate of perceived exertion (RPE) scale Yes       Intervention Provide education and explanation on how to use RPE scale       Expected Outcomes Short Term: Able to use RPE daily in rehab to express subjective intensity level;Long Term:  Able to use RPE to guide intensity level when exercising independently       Knowledge and understanding of Target Heart Rate Range (THRR) Yes       Intervention Provide education and explanation of  THRR including how the numbers were predicted and where they are located for reference       Expected Outcomes Short Term: Able to state/look up THRR;Long Term: Able to use THRR to govern intensity when exercising independently;Short Term: Able to use daily as guideline for intensity in rehab       Understanding of Exercise Prescription Yes       Intervention Provide education, explanation, and written materials on patient's individual exercise prescription       Expected Outcomes Short Term: Able to explain program exercise prescription;Long Term: Able to explain home exercise prescription to exercise independently          Exercise Goals Re-Evaluation :   Discharge Exercise Prescription (Final Exercise Prescription Changes):   Nutrition:  Target Goals: Understanding of nutrition guidelines, daily intake of sodium 1500mg , cholesterol 200mg , calories 30% from fat and 7% or less from saturated fats, daily to have 5 or more servings of fruits and vegetables.  Biometrics:  Pre Biometrics - 07/18/24 1345       Pre Biometrics   Waist Circumference 41.5 inches    Hip Circumference 45 inches    Waist to Hip Ratio 0.92 %    Triceps Skinfold 20 mm    % Body Fat 41.5 %    Grip Strength 11 kg    Flexibility --   not performed due to low back problems/pain   Single Leg Stand --   not performed          Nutrition Therapy Plan and Nutrition Goals:   Nutrition Assessments:  MEDIFICTS Score Key: >=70 Need to make dietary changes  40-70 Heart Healthy Diet <= 40 Therapeutic Level Cholesterol Diet    Picture Your Plate Scores: <59 Unhealthy dietary pattern with much room for improvement. 41-50 Dietary pattern unlikely to meet recommendations for good health and room for improvement. 51-60 More healthful dietary pattern, with some room for improvement.  >60 Healthy dietary pattern, although there may be some specific behaviors that could be improved.    Nutrition Goals  Re-Evaluation:   Nutrition Goals Re-Evaluation:   Nutrition Goals Discharge (Final Nutrition Goals Re-Evaluation):   Psychosocial: Target Goals: Acknowledge presence or absence of significant depression and/or stress, maximize coping skills, provide positive support system. Participant is able to verbalize types and ability to use techniques and skills needed for reducing stress and depression.  Initial Review & Psychosocial Screening:  Initial Psych Review & Screening - 07/18/24 1502       Initial Review   Current issues with Current Stress Concerns;Current Psychotropic Meds;Current Anxiety/Panic;History of Depression    Comments Swara has a history of Bipolar disorder. Debbera says that she is having anxiety  about a stray cat that is living outside at her home. Giuseppina says that she has received counsiling in the past and is not interested in receiving any counselling currently.  Moira says the medications that she is taking are working for her.      Family Dynamics   Good Support System? Yes   Dare lives alone. Judeth has her daughter and friends for support.     Barriers   Psychosocial barriers to participate in program The patient should benefit from training in stress management and relaxation.      Screening Interventions   Interventions Encouraged to exercise;Provide feedback about the scores to participant    Expected Outcomes Long Term Goal: Stressors or current issues are controlled or eliminated.;Short Term goal: Identification and review with participant of any Quality of Life or Depression concerns found by scoring the questionnaire.;Long Term goal: The participant improves quality of Life and PHQ9 Scores as seen by post scores and/or verbalization of changes          Quality of Life Scores:  Quality of Life - 07/18/24 1532       Quality of Life   Select Quality of Life      Quality of Life Scores   Health/Function Pre 22.36 %    Socioeconomic Pre 25 %     Psych/Spiritual Pre 26.57 %    Family Pre 27 %    GLOBAL Pre 24.42 %         Scores of 19 and below usually indicate a poorer quality of life in these areas.  A difference of  2-3 points is a clinically meaningful difference.  A difference of 2-3 points in the total score of the Quality of Life Index has been associated with significant improvement in overall quality of life, self-image, physical symptoms, and general health in studies assessing change in quality of life.  PHQ-9: Review Flowsheet  More data exists      07/18/2024 03/19/2024 09/26/2023 06/26/2023 05/10/2023  Depression screen PHQ 2/9  Decreased Interest 0 2 1 2 2   Down, Depressed, Hopeless 0 2 2 2 2   PHQ - 2 Score 0 4 3 4 4   Altered sleeping 0 3 1 2 2   Tired, decreased energy 3 3 3 2 2   Change in appetite 2 0 2 0 0  Feeling bad or failure about yourself  1 0 1 0 0  Trouble concentrating 0 0 0 0 0  Moving slowly or fidgety/restless 0 0 0 0 0  Suicidal thoughts 0 0 - 0 0  PHQ-9 Score 6 10  10  8  8    Difficult doing work/chores Somewhat difficult Very difficult Very difficult Very difficult Very difficult    Details       Data saved with a previous flowsheet row definition        Interpretation of Total Score  Total Score Depression Severity:  1-4 = Minimal depression, 5-9 = Mild depression, 10-14 = Moderate depression, 15-19 = Moderately severe depression, 20-27 = Severe depression   Psychosocial Evaluation and Intervention:   Psychosocial Re-Evaluation:   Psychosocial Discharge (Final Psychosocial Re-Evaluation):   Vocational Rehabilitation: Provide vocational rehab assistance to qualifying candidates.   Vocational Rehab Evaluation & Intervention:  Vocational Rehab - 07/18/24 1508       Initial Vocational Rehab Evaluation & Intervention   Assessment shows need for Vocational Rehabilitation No   Meryem is retired and does not need vocational rehab at this time  Education: Education  Goals: Education classes will be provided on a weekly basis, covering required topics. Participant will state understanding/return demonstration of topics presented.     Core Videos: Exercise    Move It!  Clinical staff conducted group or individual video education with verbal and written material and guidebook.  Patient learns the recommended Pritikin exercise program. Exercise with the goal of living a long, healthy life. Some of the health benefits of exercise include controlled diabetes, healthier blood pressure levels, improved cholesterol levels, improved heart and lung capacity, improved sleep, and better body composition. Everyone should speak with their doctor before starting or changing an exercise routine.  Biomechanical Limitations Clinical staff conducted group or individual video education with verbal and written material and guidebook.  Patient learns how biomechanical limitations can impact exercise and how we can mitigate and possibly overcome limitations to have an impactful and balanced exercise routine.  Body Composition Clinical staff conducted group or individual video education with verbal and written material and guidebook.  Patient learns that body composition (ratio of muscle mass to fat mass) is a key component to assessing overall fitness, rather than body weight alone. Increased fat mass, especially visceral belly fat, can put us  at increased risk for metabolic syndrome, type 2 diabetes, heart disease, and even death. It is recommended to combine diet and exercise (cardiovascular and resistance training) to improve your body composition. Seek guidance from your physician and exercise physiologist before implementing an exercise routine.  Exercise Action Plan Clinical staff conducted group or individual video education with verbal and written material and guidebook.  Patient learns the recommended strategies to achieve and enjoy long-term exercise adherence, including  variety, self-motivation, self-efficacy, and positive decision making. Benefits of exercise include fitness, good health, weight management, more energy, better sleep, less stress, and overall well-being.  Medical   Heart Disease Risk Reduction Clinical staff conducted group or individual video education with verbal and written material and guidebook.  Patient learns our heart is our most vital organ as it circulates oxygen, nutrients, white blood cells, and hormones throughout the entire body, and carries waste away. Data supports a plant-based eating plan like the Pritikin Program for its effectiveness in slowing progression of and reversing heart disease. The video provides a number of recommendations to address heart disease.   Metabolic Syndrome and Belly Fat  Clinical staff conducted group or individual video education with verbal and written material and guidebook.  Patient learns what metabolic syndrome is, how it leads to heart disease, and how one can reverse it and keep it from coming back. You have metabolic syndrome if you have 3 of the following 5 criteria: abdominal obesity, high blood pressure, high triglycerides, low HDL cholesterol, and high blood sugar.  Hypertension and Heart Disease Clinical staff conducted group or individual video education with verbal and written material and guidebook.  Patient learns that high blood pressure, or hypertension, is very common in the [place] . Hypertension is largely due to excessive salt intake, but other important risk factors include being overweight, physical inactivity, drinking too much alcohol, smoking, and not eating enough potassium from fruits and vegetables. High blood pressure is a leading risk factor for heart attack, stroke, congestive heart failure, dementia, kidney failure, and premature death. Long-term effects of excessive salt intake include stiffening of the arteries and thickening of heart muscle and organ damage.  Recommendations include ways to reduce hypertension and the risk of heart disease.  Diseases of Our Time - Focusing on Diabetes  Clinical staff conducted group or individual video education with verbal and written material and guidebook.  Patient learns why the best way to stop diseases of our time is prevention, through food and other lifestyle changes. Medicine (such as prescription pills and surgeries) is often only a Band-Aid on the problem, not a long-term solution. Most common diseases of our time include obesity, type 2 diabetes, hypertension, heart disease, and cancer. The Pritikin Program is recommended and has been proven to help reduce, reverse, and/or prevent the damaging effects of metabolic syndrome.  Nutrition   Overview of the Pritikin Eating Plan  Clinical staff conducted group or individual video education with verbal and written material and guidebook.  Patient learns about the Pritikin Eating Plan for disease risk reduction. The Pritikin Eating Plan emphasizes a wide variety of unrefined, minimally-processed carbohydrates, like fruits, vegetables, whole grains, and legumes. Go, Caution, and Stop food choices are explained. Plant-based and lean animal proteins are emphasized. Rationale provided for low sodium intake for blood pressure control, low added sugars for blood sugar stabilization, and low added fats and oils for coronary artery disease risk reduction and weight management.  Calorie Density  Clinical staff conducted group or individual video education with verbal and written material and guidebook.  Patient learns about calorie density and how it impacts the Pritikin Eating Plan. Knowing the characteristics of the food you choose will help you decide whether those foods will lead to weight gain or weight loss, and whether you want to consume more or less of them. Weight loss is usually a side effect of the Pritikin Eating Plan because of its focus on low calorie-dense  foods.  Label Reading  Clinical staff conducted group or individual video education with verbal and written material and guidebook.  Patient learns about the Pritikin recommended label reading guidelines and corresponding recommendations regarding calorie density, added sugars, sodium content, and whole grains.  Dining Out - Part 1  Clinical staff conducted group or individual video education with verbal and written material and guidebook.  Patient learns that restaurant meals can be sabotaging because they can be so high in calories, fat, sodium, and/or sugar. Patient learns recommended strategies on how to positively address this and avoid unhealthy pitfalls.  Facts on Fats  Clinical staff conducted group or individual video education with verbal and written material and guidebook.  Patient learns that lifestyle modifications can be just as effective, if not more so, as many medications for lowering your risk of heart disease. A Pritikin lifestyle can help to reduce your risk of inflammation and atherosclerosis (cholesterol build-up, or plaque, in the artery walls). Lifestyle interventions such as dietary choices and physical activity address the cause of atherosclerosis. A review of the types of fats and their impact on blood cholesterol levels, along with dietary recommendations to reduce fat intake is also included.  Nutrition Action Plan  Clinical staff conducted group or individual video education with verbal and written material and guidebook.  Patient learns how to incorporate Pritikin recommendations into their lifestyle. Recommendations include planning and keeping personal health goals in mind as an important part of their success.  Healthy Mind-Set    Healthy Minds, Bodies, Hearts  Clinical staff conducted group or individual video education with verbal and written material and guidebook.  Patient learns how to identify when they are stressed. Video will discuss the impact of that  stress, as well as the many benefits of stress management. Patient will also be introduced to stress management techniques. The  way we think, act, and feel has an impact on our hearts.  How Our Thoughts Can Heal Our Hearts  Clinical staff conducted group or individual video education with verbal and written material and guidebook.  Patient learns that negative thoughts can cause depression and anxiety. This can result in negative lifestyle behavior and serious health problems. Cognitive behavioral therapy is an effective method to help control our thoughts in order to change and improve our emotional outlook.  Additional Videos:  Exercise    Improving Performance  Clinical staff conducted group or individual video education with verbal and written material and guidebook.  Patient learns to use a non-linear approach by alternating intensity levels and lengths of time spent exercising to help burn more calories and lose more body fat. Cardiovascular exercise helps improve heart health, metabolism, hormonal balance, blood sugar control, and recovery from fatigue. Resistance training improves strength, endurance, balance, coordination, reaction time, metabolism, and muscle mass. Flexibility exercise improves circulation, posture, and balance. Seek guidance from your physician and exercise physiologist before implementing an exercise routine and learn your capabilities and proper form for all exercise.  Introduction to Yoga  Clinical staff conducted group or individual video education with verbal and written material and guidebook.  Patient learns about yoga, a discipline of the coming together of mind, breath, and body. The benefits of yoga include improved flexibility, improved range of motion, better posture and core strength, increased lung function, weight loss, and positive self-image. Yoga's heart health benefits include lowered blood pressure, healthier heart rate, decreased cholesterol and  triglyceride levels, improved immune function, and reduced stress. Seek guidance from your physician and exercise physiologist before implementing an exercise routine and learn your capabilities and proper form for all exercise.  Medical   Aging: Enhancing Your Quality of Life  Clinical staff conducted group or individual video education with verbal and written material and guidebook.  Patient learns key strategies and recommendations to stay in good physical health and enhance quality of life, such as prevention strategies, having an advocate, securing a Health Care Proxy and Power of Attorney, and keeping a list of medications and system for tracking them. It also discusses how to avoid risk for bone loss.  Biology of Weight Control  Clinical staff conducted group or individual video education with verbal and written material and guidebook.  Patient learns that weight gain occurs because we consume more calories than we burn (eating more, moving less). Even if your body weight is normal, you may have higher ratios of fat compared to muscle mass. Too much body fat puts you at increased risk for cardiovascular disease, heart attack, stroke, type 2 diabetes, and obesity-related cancers. In addition to exercise, following the Pritikin Eating Plan can help reduce your risk.  Decoding Lab Results  Clinical staff conducted group or individual video education with verbal and written material and guidebook.  Patient learns that lab test reflects one measurement whose values change over time and are influenced by many factors, including medication, stress, sleep, exercise, food, hydration, pre-existing medical conditions, and more. It is recommended to use the knowledge from this video to become more involved with your lab results and evaluate your numbers to speak with your doctor.   Diseases of Our Time - Overview  Clinical staff conducted group or individual video education with verbal and written  material and guidebook.  Patient learns that according to the CDC, 50% to 70% of chronic diseases (such as obesity, type 2 diabetes, elevated lipids, hypertension, and  heart disease) are avoidable through lifestyle improvements including healthier food choices, listening to satiety cues, and increased physical activity.  Sleep Disorders Clinical staff conducted group or individual video education with verbal and written material and guidebook.  Patient learns how good quality and duration of sleep are important to overall health and well-being. Patient also learns about sleep disorders and how they impact health along with recommendations to address them, including discussing with a physician.  Nutrition  Dining Out - Part 2 Clinical staff conducted group or individual video education with verbal and written material and guidebook.  Patient learns how to plan ahead and communicate in order to maximize their dining experience in a healthy and nutritious manner. Included are recommended food choices based on the type of restaurant the patient is visiting.   Fueling a Banker conducted group or individual video education with verbal and written material and guidebook.  There is a strong connection between our food choices and our health. Diseases like obesity and type 2 diabetes are very prevalent and are in large-part due to lifestyle choices. The Pritikin Eating Plan provides plenty of food and hunger-curbing satisfaction. It is easy to follow, affordable, and helps reduce health risks.  Menu Workshop  Clinical staff conducted group or individual video education with verbal and written material and guidebook.  Patient learns that restaurant meals can sabotage health goals because they are often packed with calories, fat, sodium, and sugar. Recommendations include strategies to plan ahead and to communicate with the manager, chef, or server to help order a healthier  meal.  Planning Your Eating Strategy  Clinical staff conducted group or individual video education with verbal and written material and guidebook.  Patient learns about the Pritikin Eating Plan and its benefit of reducing the risk of disease. The Pritikin Eating Plan does not focus on calories. Instead, it emphasizes high-quality, nutrient-rich foods. By knowing the characteristics of the foods, we choose, we can determine their calorie density and make informed decisions.  Targeting Your Nutrition Priorities  Clinical staff conducted group or individual video education with verbal and written material and guidebook.  Patient learns that lifestyle habits have a tremendous impact on disease risk and progression. This video provides eating and physical activity recommendations based on your personal health goals, such as reducing LDL cholesterol, losing weight, preventing or controlling type 2 diabetes, and reducing high blood pressure.  Vitamins and Minerals  Clinical staff conducted group or individual video education with verbal and written material and guidebook.  Patient learns different ways to obtain key vitamins and minerals, including through a recommended healthy diet. It is important to discuss all supplements you take with your doctor.   Healthy Mind-Set    Smoking Cessation  Clinical staff conducted group or individual video education with verbal and written material and guidebook.  Patient learns that cigarette smoking and tobacco addiction pose a serious health risk which affects millions of people. Stopping smoking will significantly reduce the risk of heart disease, lung disease, and many forms of cancer. Recommended strategies for quitting are covered, including working with your doctor to develop a successful plan.  Culinary   Becoming a Set Designer conducted group or individual video education with verbal and written material and guidebook.  Patient learns  that cooking at home can be healthy, cost-effective, quick, and puts them in control. Keys to cooking healthy recipes will include looking at your recipe, assessing your equipment needs, planning ahead, making it  simple, choosing cost-effective seasonal ingredients, and limiting the use of added fats, salts, and sugars.  Cooking - Breakfast and Snacks  Clinical staff conducted group or individual video education with verbal and written material and guidebook.  Patient learns how important breakfast is to satiety and nutrition through the entire day. Recommendations include key foods to eat during breakfast to help stabilize blood sugar levels and to prevent overeating at meals later in the day. Planning ahead is also a key component.  Cooking - Educational Psychologist conducted group or individual video education with verbal and written material and guidebook.  Patient learns eating strategies to improve overall health, including an approach to cook more at home. Recommendations include thinking of animal protein as a side on your plate rather than center stage and focusing instead on lower calorie dense options like vegetables, fruits, whole grains, and plant-based proteins, such as beans. Making sauces in large quantities to freeze for later and leaving the skin on your vegetables are also recommended to maximize your experience.  Cooking - Healthy Salads and Dressing Clinical staff conducted group or individual video education with verbal and written material and guidebook.  Patient learns that vegetables, fruits, whole grains, and legumes are the foundations of the Pritikin Eating Plan. Recommendations include how to incorporate each of these in flavorful and healthy salads, and how to create homemade salad dressings. Proper handling of ingredients is also covered. Cooking - Soups and [employer] - Soups and Desserts Clinical staff conducted group or individual video education with  verbal and written material and guidebook.  Patient learns that Pritikin soups and desserts make for easy, nutritious, and delicious snacks and meal components that are low in sodium, fat, sugar, and calorie density, while high in vitamins, minerals, and filling fiber. Recommendations include simple and healthy ideas for soups and desserts.   Overview     The Pritikin Solution Program Overview Clinical staff conducted group or individual video education with verbal and written material and guidebook.  Patient learns that the results of the Pritikin Program have been documented in more than 100 articles published in peer-reviewed journals, and the benefits include reducing risk factors for (and, in some cases, even reversing) high cholesterol, high blood pressure, type 2 diabetes, obesity, and more! An overview of the three key pillars of the Pritikin Program will be covered: eating well, doing regular exercise, and having a healthy mind-set.  WORKSHOPS  Exercise: Exercise Basics: Building Your Action Plan Clinical staff led group instruction and group discussion with PowerPoint presentation and patient guidebook. To enhance the learning environment the use of posters, models and videos may be added. At the conclusion of this workshop, patients will comprehend the difference between physical activity and exercise, as well as the benefits of incorporating both, into their routine. Patients will understand the FITT (Frequency, Intensity, Time, and Type) principle and how to use it to build an exercise action plan. In addition, safety concerns and other considerations for exercise and cardiac rehab will be addressed by the presenter. The purpose of this lesson is to promote a comprehensive and effective weekly exercise routine in order to improve patients' overall level of fitness.   Managing Heart Disease: Your Path to a Healthier Heart Clinical staff led group instruction and group discussion with  PowerPoint presentation and patient guidebook. To enhance the learning environment the use of posters, models and videos may be added.At the conclusion of this workshop, patients will understand the anatomy and  physiology of the heart. Additionally, they will understand how Pritikin's three pillars impact the risk factors, the progression, and the management of heart disease.  The purpose of this lesson is to provide a high-level overview of the heart, heart disease, and how the Pritikin lifestyle positively impacts risk factors.  Exercise Biomechanics Clinical staff led group instruction and group discussion with PowerPoint presentation and patient guidebook. To enhance the learning environment the use of posters, models and videos may be added. Patients will learn how the structural parts of their bodies function and how these functions impact their daily activities, movement, and exercise. Patients will learn how to promote a neutral spine, learn how to manage pain, and identify ways to improve their physical movement in order to promote healthy living. The purpose of this lesson is to expose patients to common physical limitations that impact physical activity. Participants will learn practical ways to adapt and manage aches and pains, and to minimize their effect on regular exercise. Patients will learn how to maintain good posture while sitting, walking, and lifting.  Balance Training and Fall Prevention  Clinical staff led group instruction and group discussion with PowerPoint presentation and patient guidebook. To enhance the learning environment the use of posters, models and videos may be added. At the conclusion of this workshop, patients will understand the importance of their sensorimotor skills (vision, proprioception, and the vestibular system) in maintaining their ability to balance as they age. Patients will apply a variety of balancing exercises that are appropriate for their  current level of function. Patients will understand the common causes for poor balance, possible solutions to these problems, and ways to modify their physical environment in order to minimize their fall risk. The purpose of this lesson is to teach patients about the importance of maintaining balance as they age and ways to minimize their risk of falling.  WORKSHOPS   Nutrition:  Fueling a Ship Broker led group instruction and group discussion with PowerPoint presentation and patient guidebook. To enhance the learning environment the use of posters, models and videos may be added. Patients will review the foundational principles of the Pritikin Eating Plan and understand what constitutes a serving size in each of the food groups. Patients will also learn Pritikin-friendly foods that are better choices when away from home and review make-ahead meal and snack options. Calorie density will be reviewed and applied to three nutrition priorities: weight maintenance, weight loss, and weight gain. The purpose of this lesson is to reinforce (in a group setting) the key concepts around what patients are recommended to eat and how to apply these guidelines when away from home by planning and selecting Pritikin-friendly options. Patients will understand how calorie density may be adjusted for different weight management goals.  Mindful Eating  Clinical staff led group instruction and group discussion with PowerPoint presentation and patient guidebook. To enhance the learning environment the use of posters, models and videos may be added. Patients will briefly review the concepts of the Pritikin Eating Plan and the importance of low-calorie dense foods. The concept of mindful eating will be introduced as well as the importance of paying attention to internal hunger signals. Triggers for non-hunger eating and techniques for dealing with triggers will be explored. The purpose of this lesson is to provide  patients with the opportunity to review the basic principles of the Pritikin Eating Plan, discuss the value of eating mindfully and how to measure internal cues of hunger and fullness using the Hunger  Scale. Patients will also discuss reasons for non-hunger eating and learn strategies to use for controlling emotional eating.  Targeting Your Nutrition Priorities Clinical staff led group instruction and group discussion with PowerPoint presentation and patient guidebook. To enhance the learning environment the use of posters, models and videos may be added. Patients will learn how to determine their genetic susceptibility to disease by reviewing their family history. Patients will gain insight into the importance of diet as part of an overall healthy lifestyle in mitigating the impact of genetics and other environmental insults. The purpose of this lesson is to provide patients with the opportunity to assess their personal nutrition priorities by looking at their family history, their own health history and current risk factors. Patients will also be able to discuss ways of prioritizing and modifying the Pritikin Eating Plan for their highest risk areas  Menu  Clinical staff led group instruction and group discussion with PowerPoint presentation and patient guidebook. To enhance the learning environment the use of posters, models and videos may be added. Using menus brought in from [employer], or printed from [employer], patients will apply the Pritikin dining out guidelines that were presented in the [employer] video. Patients will also be able to practice these guidelines in a variety of provided scenarios. The purpose of this lesson is to provide patients with the opportunity to practice hands-on learning of the Pritikin Dining Out guidelines with actual menus and practice scenarios.  Label Reading Clinical staff led group instruction and group discussion with PowerPoint  presentation and patient guidebook. To enhance the learning environment the use of posters, models and videos may be added. Patients will review and discuss the Pritikin label reading guidelines presented in Pritikin's Label Reading Educational series video. Using fool labels brought in from local grocery stores and markets, patients will apply the label reading guidelines and determine if the packaged food meet the Pritikin guidelines. The purpose of this lesson is to provide patients with the opportunity to review, discuss, and practice hands-on learning of the Pritikin Label Reading guidelines with actual packaged food labels. Cooking School  Pritikin's [employer] are designed to teach patients ways to prepare quick, simple, and affordable recipes at home. The importance of nutrition's role in chronic disease risk reduction is reflected in its emphasis in the overall Pritikin program. By learning how to prepare essential core Pritikin Eating Plan recipes, patients will increase control over what they eat; be able to customize the flavor of foods without the use of added salt, sugar, or fat; and improve the quality of the food they consume. By learning a set of core recipes which are easily assembled, quickly prepared, and affordable, patients are more likely to prepare more healthy foods at home. These workshops focus on convenient breakfasts, simple entres, side dishes, and desserts which can be prepared with minimal effort and are consistent with nutrition recommendations for cardiovascular risk reduction. Cooking [employer] are taught by a armed forces logistics/support/administrative officer (RD) who has been trained by the [employer]. The chef or RD has a clear understanding of the importance of minimizing - if not completely eliminating - added fat, sugar, and sodium in recipes. Throughout the series of Cooking School Workshop sessions, patients will learn about healthy ingredients and  efficient methods of cooking to build confidence in their capability to prepare    Cooking School weekly topics:  Adding Flavor- Sodium-Free  Fast and Healthy Breakfasts  Powerhouse Plant-Based Proteins  Satisfying Salads and Dressings  Simple Sides and Sauces  International Cuisine-Spotlight on the [employer] Zones  Delicious Desserts  Savory Soups  [employer] - Meals in a Snap  Tasty Appetizers and Snacks  Comforting Weekend Breakfasts  One-Pot Wonders   Fast Evening Meals  Landscape Architect Your Pritikin Plate  WORKSHOPS   Healthy Mindset (Psychosocial):  Focused Goals, Sustainable Changes Clinical staff led group instruction and group discussion with PowerPoint presentation and patient guidebook. To enhance the learning environment the use of posters, models and videos may be added. Patients will be able to apply effective goal setting strategies to establish at least one personal goal, and then take consistent, meaningful action toward that goal. They will learn to identify common barriers to achieving personal goals and develop strategies to overcome them. Patients will also gain an understanding of how our mind-set can impact our ability to achieve goals and the importance of cultivating a positive and growth-oriented mind-set. The purpose of this lesson is to provide patients with a deeper understanding of how to set and achieve personal goals, as well as the tools and strategies needed to overcome common obstacles which may arise along the way.  From Head to Heart: The Power of a Healthy Outlook  Clinical staff led group instruction and group discussion with PowerPoint presentation and patient guidebook. To enhance the learning environment the use of posters, models and videos may be added. Patients will be able to recognize and describe the impact of emotions and mood on physical health. They will discover the importance of self-care and explore self-care  practices which may work for them. Patients will also learn how to utilize the 4 C's to cultivate a healthier outlook and better manage stress and challenges. The purpose of this lesson is to demonstrate to patients how a healthy outlook is an essential part of maintaining good health, especially as they continue their cardiac rehab journey.  Healthy Sleep for a Healthy Heart Clinical staff led group instruction and group discussion with PowerPoint presentation and patient guidebook. To enhance the learning environment the use of posters, models and videos may be added. At the conclusion of this workshop, patients will be able to demonstrate knowledge of the importance of sleep to overall health, well-being, and quality of life. They will understand the symptoms of, and treatments for, common sleep disorders. Patients will also be able to identify daytime and nighttime behaviors which impact sleep, and they will be able to apply these tools to help manage sleep-related challenges. The purpose of this lesson is to provide patients with a general overview of sleep and outline the importance of quality sleep. Patients will learn about a few of the most common sleep disorders. Patients will also be introduced to the concept of "sleep hygiene," and discover ways to self-manage certain sleeping problems through simple daily behavior changes. Finally, the workshop will motivate patients by clarifying the links between quality sleep and their goals of heart-healthy living.   Recognizing and Reducing Stress Clinical staff led group instruction and group discussion with PowerPoint presentation and patient guidebook. To enhance the learning environment the use of posters, models and videos may be added. At the conclusion of this workshop, patients will be able to understand the types of stress reactions, differentiate between acute and chronic stress, and recognize the impact that chronic stress has on their health. They  will also be able to apply different coping mechanisms, such as reframing negative self-talk. Patients will have the opportunity  to practice a variety of stress management techniques, such as deep abdominal breathing, progressive muscle relaxation, and/or guided imagery.  The purpose of this lesson is to educate patients on the role of stress in their lives and to provide healthy techniques for coping with it.  Learning Barriers/Preferences:  Learning Barriers/Preferences - 07/18/24 1532       Learning Barriers/Preferences   Learning Barriers Sight    Learning Preferences Audio;Computer/Internet;Group Instruction;Individual Instruction;Pictoral;Skilled Demonstration;Verbal Instruction;Video;Written Material          Education Topics:  Knowledge Questionnaire Score:  Knowledge Questionnaire Score - 07/18/24 1534       Knowledge Questionnaire Score   Pre Score 21/24          Core Components/Risk Factors/Patient Goals at Admission:  Personal Goals and Risk Factors at Admission - 07/18/24 1534       Core Components/Risk Factors/Patient Goals on Admission   Hypertension Yes    Intervention Provide education on lifestyle modifcations including regular physical activity/exercise, weight management, moderate sodium restriction and increased consumption of fresh fruit, vegetables, and low fat dairy, alcohol moderation, and smoking cessation.;Monitor prescription use compliance.    Expected Outcomes Short Term: Continued assessment and intervention until BP is < 140/67mm HG in hypertensive participants. < 130/23mm HG in hypertensive participants with diabetes, heart failure or chronic kidney disease.;Long Term: Maintenance of blood pressure at goal levels.    Lipids Yes    Intervention Provide education and support for participant on nutrition & aerobic/resistive exercise along with prescribed medications to achieve LDL 70mg , HDL >40mg .    Expected Outcomes Short Term: Participant states  understanding of desired cholesterol values and is compliant with medications prescribed. Participant is following exercise prescription and nutrition guidelines.;Long Term: Cholesterol controlled with medications as prescribed, with individualized exercise RX and with personalized nutrition plan. Value goals: LDL < 70mg , HDL > 40 mg.    Stress Yes    Intervention Offer individual and/or small group education and counseling on adjustment to heart disease, stress management and health-related lifestyle change. Teach and support self-help strategies.;Refer participants experiencing significant psychosocial distress to appropriate mental health specialists for further evaluation and treatment. When possible, include family members and significant others in education/counseling sessions.    Expected Outcomes Short Term: Participant demonstrates changes in health-related behavior, relaxation and other stress management skills, ability to obtain effective social support, and compliance with psychotropic medications if prescribed.;Long Term: Emotional wellbeing is indicated by absence of clinically significant psychosocial distress or social isolation.          Core Components/Risk Factors/Patient Goals Review:    Core Components/Risk Factors/Patient Goals at Discharge (Final Review):    ITP Comments:  ITP Comments     Row Name 07/18/24 1402           ITP Comments Dr Wilbert Bihari MD, Medical Director. [facility]/ Intensive Cardiac Rehab. Initial Orientation Packet Reviewed with the patient.          Comments: Participant attended orientation for the cardiac rehabilitation program on  07/18/2024  to perform initial intake and exercise walk test. Patient introduced to the [facility] education and orientation packet was reviewed. Completed 6-minute walk test, measurements, initial ITP, and exercise prescription. Vital signs stable. Telemetry-normal sinus rhythm, asymptomatic.    Service time was from 1315 to 1504 .

## 2024-07-18 NOTE — Progress Notes (Signed)
 Cardiac Rehab Medication Review by a Nurse  Does the patient  feel that his/her medications are working for him/her?  yes  Has the patient been experiencing any side effects to the medications prescribed?  no  Does the patient measure his/her own blood pressure or blood glucose at home?  no   Does the patient have any problems obtaining medications due to transportation or finances?   no  Understanding of regimen: good Understanding of indications: good Potential of compliance: good    Nurse comments: Becky Lawson is taking her medications as prescribed and has a good understanding of what her medications are for. Becky Lawson has a BP cuff/monitor. Becky Lawson does not check her blood pressures on a regular basis.    [name] RN 07/18/2024 1:55 PM

## 2024-07-22 ENCOUNTER — Encounter (HOSPITAL_COMMUNITY)
Admission: RE | Admit: 2024-07-22 | Discharge: 2024-07-22 | Disposition: A | Discharge status: Home / Self Care | Source: Ambulatory Visit | Attending: Cardiology | Admitting: Cardiology

## 2024-07-22 DIAGNOSIS — Z951 Presence of aortocoronary bypass graft: Secondary | ICD-10-CM

## 2024-07-22 NOTE — Progress Notes (Signed)
 Daily Session Note  Patient Details  Name: Becky Lawson MRN: [id number] Date of Birth: 1950/10/27 Referring Provider:   Flowsheet Row INTENSIVE CARDIAC REHAB ORIENT from 07/18/2024 in [facility]  Referring Provider Gordy Bergamo, MD    Encounter Date: 07/22/2024  Check In:  Session Check In - 07/22/24 1225       Check-In   Supervising physician immediately available to respond to emergencies [facility] MD immediately available    Physician(s) Josefa Beauvais, NP    Location [facility]    Staff Present Hadassah Quan, RN, Mallory Parkins, MS, ACSM-CEP, CCRP, Exercise Physiologist;Mary Eek, RN, BSN;Casey Smith, Grayland Gal, MS, ACSM-CEP, Exercise Physiologist;Lillien Petronio Lennon, RN, BSN    Virtual Visit No    Medication changes reported     No    Fall or balance concerns reported    No    Tobacco Cessation No Change    Warm-up and Cool-down Performed as group-led instruction    Resistance Training Performed Yes    VAD Patient? No    PAD/SET Patient? No      Pain Assessment   Currently in Pain? No/denies    Pain Score 0-No pain    Multiple Pain Sites No          Capillary Blood Glucose: No results found for this or any previous visit (from the past 24 hours).   Exercise Prescription Changes - 07/22/24 1600       Response to Exercise   Blood Pressure (Admit) 120/72    Blood Pressure (Exercise) 124/70    Blood Pressure (Exit) 102/54    Heart Rate (Admit) 70 bpm    Heart Rate (Exercise) 81 bpm    Heart Rate (Exit) 75 bpm    Rating of Perceived Exertion (Exercise) 9    Symptoms None    Comments Pt's first day in the [facility] program    Duration Progress to 30 minutes of  aerobic without signs/symptoms of physical distress    Intensity THRR unchanged      Progression   Progression Continue to progress workloads to maintain intensity without signs/symptoms of physical distress.    Average METs 1.7       Resistance Training   Training Prescription Yes    Weight 2 lbs    Reps 10-15    Time 5 Minutes      Interval Training   Interval Training No      NuStep   Level 1    SPM 67    Minutes 25    METs 1.7          Social History   Tobacco Use  Smoking Status Former   Current packs/day: 0.00   Average packs/day: 1 pack/day for 15.0 years (15.0 ttl pk-yrs)   Types: Cigarettes   Start date: 06/29/1964   Quit date: 06/30/1979   Years since quitting: 45.0  Smokeless Tobacco Never    Goals Met:  Exercise tolerated well No report of concerns or symptoms today Strength training completed today  Goals Unmet:  Not Applicable  Comments: Pt started cardiac rehab today.  Pt tolerated light exercise without difficulty. VSS, telemetry-NSR, asymptomatic.  Medication list reconciled. Pt denies barriers to medication compliance.  PSYCHOSOCIAL ASSESSMENT:  PHQ-6. Pt exhibits positive coping skills, hopeful outlook with supportive family. No psychosocial needs identified at this time, no psychosocial interventions necessary.   Pt oriented to exercise equipment and routine.  Understanding verbalized.  Dr. Wilbert Bihari is Medical Director for Cardiac Rehab at [facility].

## 2024-07-24 ENCOUNTER — Encounter (HOSPITAL_COMMUNITY)
Admission: RE | Admit: 2024-07-24 | Discharge: 2024-07-24 | Disposition: A | Source: Ambulatory Visit | Attending: Cardiology

## 2024-07-24 DIAGNOSIS — Z951 Presence of aortocoronary bypass graft: Secondary | ICD-10-CM

## 2024-07-26 ENCOUNTER — Encounter (HOSPITAL_COMMUNITY)
Admission: RE | Admit: 2024-07-26 | Discharge: 2024-07-26 | Disposition: A | Discharge status: Home / Self Care | Source: Ambulatory Visit | Attending: Cardiology | Admitting: Cardiology

## 2024-07-26 DIAGNOSIS — Z951 Presence of aortocoronary bypass graft: Secondary | ICD-10-CM | POA: Diagnosis not present

## 2024-07-29 ENCOUNTER — Ambulatory Visit: Admitting: Behavioral Health

## 2024-07-29 ENCOUNTER — Encounter (HOSPITAL_COMMUNITY)
Admission: RE | Admit: 2024-07-29 | Discharge: 2024-07-29 | Disposition: A | Discharge status: Home / Self Care | Source: Ambulatory Visit | Attending: Cardiology | Admitting: Cardiology

## 2024-07-29 DIAGNOSIS — Z951 Presence of aortocoronary bypass graft: Secondary | ICD-10-CM

## 2024-07-31 ENCOUNTER — Encounter (HOSPITAL_COMMUNITY)
Admission: RE | Admit: 2024-07-31 | Discharge: 2024-07-31 | Disposition: A | Discharge status: Home / Self Care | Source: Ambulatory Visit | Attending: Cardiology | Admitting: Cardiology

## 2024-07-31 DIAGNOSIS — Z951 Presence of aortocoronary bypass graft: Secondary | ICD-10-CM | POA: Diagnosis not present

## 2024-08-02 ENCOUNTER — Encounter (HOSPITAL_COMMUNITY)

## 2024-08-05 ENCOUNTER — Encounter (HOSPITAL_COMMUNITY)
Admission: RE | Admit: 2024-08-05 | Discharge: 2024-08-05 | Disposition: A | Source: Ambulatory Visit | Attending: Cardiology

## 2024-08-05 DIAGNOSIS — Z951 Presence of aortocoronary bypass graft: Secondary | ICD-10-CM | POA: Diagnosis present

## 2024-08-06 ENCOUNTER — Ambulatory Visit: Admitting: Behavioral Health

## 2024-08-06 ENCOUNTER — Encounter: Payer: Self-pay | Admitting: Behavioral Health

## 2024-08-06 DIAGNOSIS — F3162 Bipolar disorder, current episode mixed, moderate: Secondary | ICD-10-CM | POA: Diagnosis not present

## 2024-08-06 DIAGNOSIS — F99 Mental disorder, not otherwise specified: Secondary | ICD-10-CM | POA: Diagnosis not present

## 2024-08-06 DIAGNOSIS — F3130 Bipolar disorder, current episode depressed, mild or moderate severity, unspecified: Secondary | ICD-10-CM | POA: Diagnosis not present

## 2024-08-06 DIAGNOSIS — F5105 Insomnia due to other mental disorder: Secondary | ICD-10-CM | POA: Diagnosis not present

## 2024-08-06 DIAGNOSIS — F411 Generalized anxiety disorder: Secondary | ICD-10-CM | POA: Diagnosis not present

## 2024-08-06 MED ORDER — LAMOTRIGINE 100 MG PO TABS: ORAL_TABLET | ORAL | 1 refills | Status: AC

## 2024-08-06 MED ORDER — TRAZODONE HCL 150 MG PO TABS: ORAL_TABLET | ORAL | 1 refills | Status: AC

## 2024-08-06 MED ORDER — QUETIAPINE FUMARATE 50 MG PO TABS: 50.0000 mg | ORAL_TABLET | Freq: Every day | ORAL | 1 refills | Status: AC

## 2024-08-06 NOTE — Progress Notes (Signed)
 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 Swallow whole., Disp: 30 tablet, Rfl: 12   atorvastatin  (LIPITOR ) 80 MG tablet, Take 1 tablet (80 mg total) by mouth daily., Disp: 90 tablet, Rfl: 3   clopidogrel  (PLAVIX ) 75 MG tablet, Take 1 tablet (75 mg total) by mouth daily with breakfast., Disp: 90 tablet, Rfl: 3   docusate sodium  (COLACE) 100 MG capsule, Take 1 capsule (100 mg total) by mouth 2 (two) times daily., Disp: 10 capsule, Rfl: 0   ezetimibe  (ZETIA ) 10 MG tablet, Take 1 tablet (10 mg total) by mouth daily., Disp: 90 tablet, Rfl: 3   famotidine  (PEPCID ) 20 MG tablet, Take 1 tablet (20 mg total) by mouth daily., Disp: 90 tablet, Rfl: 0   Fe Fum-Vit C-Vit B12-FA (TRIGELS-F FORTE) CAPS capsule, Take 1 capsule by mouth daily after breakfast., Disp: , Rfl:    lamoTRIgine  (LAMICTAL ) 100 MG tablet, TAKE TWO TABLETS BY MOUTH EVERY MORNING and TAKE ONE TABLET BY MOUTH EVERY EVENING, Disp: 270 tablet, Rfl: 1   levothyroxine  (SYNTHROID ) 100 MCG tablet, TAKE 1 TABLET BY MOUTH DAILY BEFORE BREAKFAST, Disp: 90 tablet, Rfl: 1   losartan  (COZAAR ) 25 MG tablet, Take 1 tablet (25 mg total) by mouth daily., Disp: 90 tablet, Rfl: 3   metoprolol  tartrate  (LOPRESSOR ) 25 MG tablet, Take 1 tablet (25 mg total) by mouth 2 (two) times daily., Disp: 180 tablet, Rfl: 3   oxyCODONE -acetaminophen  (PERCOCET) 10-325 MG tablet, Take 1 tablet by mouth every 6 (six) hours as needed for pain., Disp: 28 tablet, Rfl: 0   polyethylene glycol (MIRALAX  / GLYCOLAX ) 17 g packet, Take 17 g by mouth daily as needed for mild constipation., Disp: 14 each, Rfl: 0   QUEtiapine  (SEROQUEL ) 50 MG tablet, Take 1 tablet (50 mg total) by mouth daily., Disp: 90 tablet, Rfl: 1   traZODone  (DESYREL ) 150 MG tablet, TAKE TWO TABLETS BY MOUTH everyday AT bedtime, Disp: 180 tablet, Rfl: 1   Vitamin D , Ergocalciferol , (DRISDOL ) 1.25 MG (50000 UNIT) CAPS capsule, Take 1 capsule (50,000 Units total) by mouth every 7 (seven) days. Sunday (Patient taking differently: Take 50,000 Units by mouth every Sunday.), Disp: 12 capsule, Rfl: 3  Past Medical History: Past Medical History:  Diagnosis Date   Anxiety    Arthritis    Benign cyst of right kidney 2014   2.7 cm right kidney cyst seen on imaging 2014 but was c/o right flank pain with new persistent proteinuria (fortunately hematuria had resolved) so repeat US  11/2016 showed right kidney still with 2.6 cm simple cyst and otherwise nml.   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, genotype 1b, followed at [facility]   Hyperlipidemia    Hypertension    Hypothyroidism    Jaundice 11/01/2012   Pill dysphagia 10/20/2021   Pruritic disorder 02/13/2013   Sleep apnea    Was diagnosed approximately 20 years ago, but does not wear CPAP patient stated I gave it back.SABRASABRAI couldn't wear that thing it was horrible   Spinal stenosis    Thyroid  disease    [employer] 06/15/2020   Unintentional weight loss of more than 10 pounds 07/12/2018     Tobacco Use: Social History   Tobacco Use  Smoking Status Former   Current packs/day: 0.00   Average packs/day: 1 pack/day for 15.0 years (15.0 ttl pk-yrs)   Types: Cigarettes   Start date: 06/29/1964   Quit date: 06/30/1979   Years since quitting: 45.1  Smokeless Tobacco Never    Labs: Review Flowsheet  More data exists      Latest Ref Rng & Units 05/12/2023 09/26/2023 04/24/2024 05/15/2024 05/17/2024  Labs for ITP Cardiac and Pulmonary Rehab  Cholestrol 100 - 199 mg/dL 850  - 869  - -  LDL (calc) 0 - 99 mg/dL 73  - 64  - -  HDL-C >60 mg/dL 49.29  - 49  - -  Trlycerides 0 - 149 mg/dL 875.9  - 89  - -  Hemoglobin A1c 4.8 - 5.6 % - 6.0  - 5.2  -  PH, Arterial 7.35 - 7.45 - - - - 7.306  7.290  7.299  7.404  7.410  7.447  7.305   PCO2 arterial 32 - 48 mmHg - - - - 46.8  45.8  45.1  35.6  36.1  32.4  38.2   Bicarbonate 20.0 - 28.0 mmol/L - - - - 23.3  22.1  22.3  22.8  22.9  23.3  22.4  19.0   TCO2 22 - 32 mmol/L - - - - 25  24  24  24  24  25  26  24  23  23  20  25    Acid-base deficit 0.0 - 2.0 mmol/L - - - - 3.0  4.0  4.0  2.0  2.0  1.0  1.0  7.0   O2 Saturation % - - - - 97  98  98  99  100  80  100  100     Details       Multiple values from one day are sorted in reverse-chronological order          Exercise Target Goals: Exercise Program Goal: Individual exercise prescription set using results from initial 6 min walk test and THRR while considering  patient's activity barriers and safety.   Exercise Prescription Goal: Initial exercise prescription builds to 30-45 minutes a day of aerobic activity, 2-3 days per week.  Home exercise guidelines will be given to patient during program as part of exercise prescription that the participant will acknowledge.   Education: Aerobic Exercise: - Group verbal and visual presentation on the components of exercise prescription. Introduces F.I.T.T principle from ACSM for exercise prescriptions.  Reviews F.I.T.T. principles of  aerobic exercise including progression. Written material provided at class time.   Education: Resistance Exercise: - Group verbal and visual presentation on the components of exercise prescription. Introduces F.I.T.T principle from ACSM for exercise prescriptions  Reviews  F.I.T.T. principles of resistance exercise including progression. Written material provided at class time.    Education: Exercise & Equipment Safety: - Individual verbal instruction and demonstration of equipment use and safety with use of the equipment.   Education: Exercise Physiology & General Exercise Guidelines: - Group verbal and written instruction with models to review the exercise physiology of the cardiovascular system and associated critical values. Provides general exercise guidelines with specific guidelines to those with heart or lung disease. Written material provided at class time.   Education: Flexibility, Balance, Mind/Body Relaxation: - Group verbal and visual presentation with interactive activity on the components of exercise prescription. Introduces F.I.T.T principle from ACSM for exercise prescriptions. Reviews F.I.T.T. principles of flexibility and balance exercise training including progression. Also discusses the mind body connection.  Reviews various relaxation techniques to help reduce and manage stress (i.e. Deep breathing, progressive muscle relaxation, and visualization). Balance handout provided to take home. Written material provided at class time.   Activity Barriers & Risk Stratification:  Activity Barriers & Cardiac Risk Stratification - 07/18/24 1540       Activity Barriers & Cardiac Risk Stratification   Activity Barriers Balance Concerns;History of Falls;Joint Problems;Arthritis;Back Problems;Deconditioning;Muscular [employer] Device;Shortness of Breath    Cardiac Risk Stratification High          6 Minute Walk:  6 Minute Walk     Row Name 07/18/24 1447         6  Minute Walk   Phase Initial  Pt used go-cart for stability     Distance 1003 feet     Walk Time 6 minutes     # of Rest Breaks 0     MPH 1.9     METS 2.21     RPE 9     Perceived Dyspnea  0.5     VO2 Peak 7.73     Symptoms Yes (comment)     Comments 7/10 chronic rt low back/hip pain     Resting HR 69 bpm     Resting BP 142/70     Max Ex. HR 103 bpm     Max Ex. BP 156/84     2 Minute Post BP 130/78        Oxygen Initial Assessment:   Oxygen Re-Evaluation:   Oxygen Discharge (Final Oxygen Re-Evaluation):   Initial Exercise Prescription:  Initial Exercise Prescription - 07/18/24 1500       Date of Initial Exercise RX and Referring Provider   Date 07/18/24    Referring Provider Gordy Bergamo, MD    Expected Discharge Date 10/09/24      NuStep   Level 1    SPM 75    Minutes 15    METs 2.2      Prescription Details   Frequency (times per week) 3    Duration Progress to 30 minutes of continuous aerobic without signs/symptoms of physical distress      Intensity   THRR 40-80% of Max Heartrate 59-118    Ratings of Perceived Exertion 11-13    Perceived Dyspnea 0-4      Progression   Progression Continue progressive overload as per policy without signs/symptoms or physical distress.      Resistance Training   Training Prescription Yes    Weight 2 lbs    Reps 10-15          Perform Capillary Blood Glucose checks as needed.  Exercise Prescription Changes:   Exercise Prescription Changes     Row Name  07/22/24 1600 08/05/24 1700           Response to Exercise   Blood Pressure (Admit) 120/72 128/72      Blood Pressure (Exercise) 124/70 152/74      Blood Pressure (Exit) 102/54 132/56      Heart Rate (Admit) 70 bpm 65 bpm      Heart Rate (Exercise) 81 bpm 85 bpm      Heart Rate (Exit) 75 bpm 68 bpm      Rating of Perceived Exertion (Exercise) 9 7      Symptoms None None      Comments Pt's first day in the CRP2 program Reviewed METs      Duration Progress  to 30 minutes of  aerobic without signs/symptoms of physical distress Continue with 30 min of aerobic exercise without signs/symptoms of physical distress.      Intensity THRR unchanged THRR unchanged        Progression   Progression Continue to progress workloads to maintain intensity without signs/symptoms of physical distress. Continue to progress workloads to maintain intensity without signs/symptoms of physical distress.      Average METs 1.7 2.2        Resistance Training   Training Prescription Yes Yes      Weight 2 lbs 2 lbs      Reps 10-15 10-15      Time 5 Minutes 5 Minutes        Interval Training   Interval Training No No        NuStep   Level 1 2      SPM 67 85      Minutes 25 30      METs 1.7 2.2         Exercise Comments:   Exercise Comments     Row Name 07/22/24 1634 08/05/24 1729         Exercise Comments Pt's first day in the program. Tolerated 25 minutes on nustep. Pt did some weight exercises and stretches due to her chronic low back problems. Reviewed METs. Pt is making good progress. Has increased her duration to 30 minutes as well as her METs         Exercise Goals and Review:   Exercise Goals     Row Name 07/18/24 1541             Exercise Goals   Increase Physical Activity Yes       Intervention Provide advice, education, support and counseling about physical activity/exercise needs.;Develop an individualized exercise prescription for aerobic and resistive training based on initial evaluation findings, risk stratification, comorbidities and participant's personal goals.       Expected Outcomes Short Term: Attend rehab on a regular basis to increase amount of physical activity.;Long Term: Add in home exercise to make exercise part of routine and to increase amount of physical activity.;Long Term: Exercising regularly at least 3-5 days a week.       Increase Strength and Stamina Yes       Intervention Provide advice, education, support and  counseling about physical activity/exercise needs.;Develop an individualized exercise prescription for aerobic and resistive training based on initial evaluation findings, risk stratification, comorbidities and participant's personal goals.       Expected Outcomes Short Term: Increase workloads from initial exercise prescription for resistance, speed, and METs.;Short Term: Perform resistance training exercises routinely during rehab and add in resistance training at home;Long Term: Improve cardiorespiratory fitness, muscular endurance and strength as measured  by increased METs and functional capacity ( )       Able to understand and use rate of perceived exertion (RPE) scale Yes       Intervention Provide education and explanation on how to use RPE scale       Expected Outcomes Short Term: Able to use RPE daily in rehab to express subjective intensity level;Long Term:  Able to use RPE to guide intensity level when exercising independently       Knowledge and understanding of Target Heart Rate Range (THRR) Yes       Intervention Provide education and explanation of THRR including how the numbers were predicted and where they are located for reference       Expected Outcomes Short Term: Able to state/look up THRR;Long Term: Able to use THRR to govern intensity when exercising independently;Short Term: Able to use daily as guideline for intensity in rehab       Understanding of Exercise Prescription Yes       Intervention Provide education, explanation, and written materials on patient's individual exercise prescription       Expected Outcomes Short Term: Able to explain program exercise prescription;Long Term: Able to explain home exercise prescription to exercise independently          Exercise Goals Re-Evaluation :  Exercise Goals Re-Evaluation     Row Name 07/22/24 1633             Exercise Goal Re-Evaluation   Exercise Goals Review Increase Physical Activity;Increase Strength and  Stamina;Able to understand and use rate of perceived exertion (RPE) scale;Knowledge and understanding of Target Heart Rate Range (THRR);Understanding of Exercise Prescription       Comments Pt's first day in the CRP2 program. Pt understands the exercise Rx, RPE scale and THRR.       Expected Outcomes Will continue to monitor the patient and progress workloads as tolerated.          Discharge Exercise Prescription (Final Exercise Prescription Changes):  Exercise Prescription Changes - 08/05/24 1700       Response to Exercise   Blood Pressure (Admit) 128/72    Blood Pressure (Exercise) 152/74    Blood Pressure (Exit) 132/56    Heart Rate (Admit) 65 bpm    Heart Rate (Exercise) 85 bpm    Heart Rate (Exit) 68 bpm    Rating of Perceived Exertion (Exercise) 7    Symptoms None    Comments Reviewed METs    Duration Continue with 30 min of aerobic exercise without signs/symptoms of physical distress.    Intensity THRR unchanged      Progression   Progression Continue to progress workloads to maintain intensity without signs/symptoms of physical distress.    Average METs 2.2      Resistance Training   Training Prescription Yes    Weight 2 lbs    Reps 10-15    Time 5 Minutes      Interval Training   Interval Training No      NuStep   Level 2    SPM 85    Minutes 30    METs 2.2          Nutrition:  Target Goals: Understanding of nutrition guidelines, daily intake of sodium 1500mg , cholesterol 200mg , calories 30% from fat and 7% or less from saturated fats, daily to have 5 or more servings of fruits and vegetables.  Education: Nutrition 1 -Group instruction provided by verbal, written material, interactive activities, discussions, models, and posters  to present general guidelines for heart healthy nutrition including macronutrients, label reading, and promoting whole foods over processed counterparts. Education serves as pensions consultant of discussion of heart healthy eating for all.  Written material provided at class time.    Education: Nutrition 2 -Group instruction provided by verbal, written material, interactive activities, discussions, models, and posters to present general guidelines for heart healthy nutrition including sodium, cholesterol, and saturated fat. Providing guidance of habit forming to improve blood pressure, cholesterol, and body weight. Written material provided at class time.     Biometrics:  Pre Biometrics - 07/18/24 1345       Pre Biometrics   Waist Circumference 41.5 inches    Hip Circumference 45 inches    Waist to Hip Ratio 0.92 %    Triceps Skinfold 20 mm    % Body Fat 41.5 %    Grip Strength 11 kg    Flexibility --   not performed due to low back problems/pain   Single Leg Stand --   not performed          Nutrition Therapy Plan and Nutrition Goals:  Nutrition Therapy & Goals - 07/22/24 1337       Nutrition Therapy   Diet Heart Healthy    Drug/Food Interactions Statins/Certain Fruits      Personal Nutrition Goals   Nutrition Goal Patient to identify strategies for reducing cardiovascular risk by attending the Pritikin education and nutrition series weekly.    Personal Goal #2 Patient to improve diet quality by using the plate method as a guide for meal planning to include lean protein/plant protein, fruits, vegetables, whole grains, nonfat dairy as part of a well-balanced diet.    Comments Patient with medical history of hypertension, GERD, fatty liver, hypothyroidism, s/p partial thyroidectomy, bipolar, prediabetes, hyperlipidemia. 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 Pt endorses familiarity with heart healthy diet; understands importance of incorporating vegetables and limiting red meat. States behavior does not always align with diet guidelines. Largely consumes poultry, though plans to  incorporate more fish. Recent lipid panel on 04/24/24 indicates LDL > 55. Patient will benefit from participation in intensive cardiac rehab for nutrition education, exercise, and lifestyle modification.      Intervention Plan   Intervention Prescribe, educate and counsel regarding individualized specific dietary modifications aiming towards targeted core components such as weight, hypertension, lipid management, diabetes, heart failure and other comorbidities.;Nutrition handout(s) given to patient.   Handouts: My Plate Planner, Pritikin Eating For a Healthy Heart   Expected Outcomes Short Term Goal: Understand basic principles of dietary content, such as calories, fat, sodium, cholesterol and nutrients.;Long Term Goal: Adherence to prescribed nutrition plan.          Nutrition Assessments:  MEDIFICTS Score Key: >=70 Need to make dietary changes  40-70 Heart Healthy Diet <= 40 Therapeutic Level Cholesterol Diet  Flowsheet Row INTENSIVE CARDIAC REHAB from 07/26/2024 in [facility] for Heart, Vascular, & Lung Health  Picture Your Plate Total Score on Admission 38   Picture Your Plate Scores: <59 Unhealthy dietary pattern with much room for improvement. 41-50 Dietary pattern unlikely to meet recommendations for good health and room for improvement. 51-60 More healthful dietary pattern, with some room for improvement.  >60 Healthy dietary pattern, although there may be some specific behaviors that could be improved.    Nutrition Goals Re-Evaluation:  Nutrition  Goals Re-Evaluation     Row Name 07/26/24 1337             Goals   Current Weight 181 lb 14.1 oz (82.5 kg)       Nutrition Goal Patient to identify strategies for reducing cardiovascular risk by attending the Pritikin education and nutrition series weekly.       Expected Outcome Contemplative. Patient with medical history of hypertension, GERD, fatty liver, hypothyroidism, s/p partial thyroidectomy,  bipolar, prediabetes, hyperlipidemia. Recent history of coronary artery bypass grafting x 2, left internal mammary artery graft to the LAD and right internal mammary artery graft to the RCA on 05/17/2024.Pt reports struggling to make dietary changes due to lack of energy. Initial PYP score indicates unhealthy dietary pattern. Reports often consuming Boost oral nutrition supplements.  Current diet lacking in variety of fruits/veggies. RD provided suggestions for easy-to-prep meals such as smoothies which may include plant-based foods. Patient will benefit from participation in intensive cardiac rehab for nutrition education, exercise, and lifestyle modification.         Personal Goal #2 Re-Evaluation   Personal Goal #2 Patient to improve diet quality by using the plate method as a guide for meal planning to include lean protein/plant protein, fruits, vegetables, whole grains, nonfat dairy as part of a well-balanced diet.          Nutrition Goals Discharge (Final Nutrition Goals Re-Evaluation):  Nutrition Goals Re-Evaluation - 07/26/24 1337       Goals   Current Weight 181 lb 14.1 oz (82.5 kg)    Nutrition Goal Patient to identify strategies for reducing cardiovascular risk by attending the P[employer]ikin education and nutrition series weekly.    Expected Outcome Contemplative. Patient with medical history of hypertension, GERD, fatty liver, hypothyroidism, s/p partial thyroidectomy, bipolar, prediabetes, hyperlipidemia. Recent history of coronary artery bypass grafting x 2, left internal mammary artery graft to the LAD and right internal mammary artery graft to the RCA on 05/17/2024.Pt reports struggling to make dietary changes due to lack of energy. Initial PYP score indicates unhealthy dietary pattern. Reports often consuming Boost oral nutrition supplements.  Current diet lacking in variety of fruits/veggies. RD provided suggestions for easy-to-prep meals such as smoothies which may include plant-based  foods. Patient will benefit from participation in intensive cardiac rehab for nutrition education, exercise, and lifestyle modification.      Personal Goal #2 Re-Evaluation   Personal Goal #2 Patient to improve diet quality by using the plate method as a guide for meal planning to include lean protein/plant protein, fruits, vegetables, whole grains, nonfat dairy as part of a well-balanced diet.          Psychosocial: Target Goals: Acknowledge presence or absence of significant depression and/or stress, maximize coping skills, provide positive support system. Participant is able to verbalize types and ability to use techniques and skills needed for reducing stress and depression.   Education: Stress, Anxiety, and Depression - Group verbal and visual presentation to define topics covered.  Reviews how body is impacted by stress, anxiety, and depression.  Also discusses healthy ways to reduce stress and to treat/manage anxiety and depression. Written material provided at class time.   Education: Sleep Hygiene -Provides group verbal and written instruction about how sleep can affect your health.  Define sleep hygiene, discuss sleep cycles and impact of sleep habits. Review good sleep hygiene tips.   Initial Review & Psychosocial Screening:  Initial Psych Review & Screening - 07/18/24 1502  Initial Review   Current issues with Current Stress Concerns;Current Psychotropic Meds;Current Anxiety/Panic;History of Depression    Comments Shelli has a history of Bipolar disorder. Kamayah says that she is having anxiety about a stray cat that is living outside at her home. Luciana says that she has received counsiling in the past and is not interested in receiving any counselling currently.  Kerri says the medications that she is taking are working for her.      Family Dynamics   Good Support System? Yes   Larya lives alone. Linzey has her daughter and friends for support.     Barriers    Psychosocial barriers to participate in program The patient should benefit from training in stress management and relaxation.      Screening Interventions   Interventions Encouraged to exercise;Provide feedback about the scores to participant    Expected Outcomes Long Term Goal: Stressors or current issues are controlled or eliminated.;Short Term goal: Identification and review with participant of any Quality of Life or Depression concerns found by scoring the questionnaire.;Long Term goal: The participant improves quality of Life and PHQ9 Scores as seen by post scores and/or verbalization of changes          Quality of Life Scores:   Quality of Life - 07/18/24 1532       Quality of Life   Select Quality of Life      Quality of Life Scores   Health/Function Pre 22.36 %    Socioeconomic Pre 25 %    Psych/Spiritual Pre 26.57 %    Family Pre 27 %    GLOBAL Pre 24.42 %         Scores of 19 and below usually indicate a poorer quality of life in these areas.  A difference of  2-3 points is a clinically meaningful difference.  A difference of 2-3 points in the total score of the Quality of Life Index has been associated with significant improvement in overall quality of life, self-image, physical symptoms, and general health in studies assessing change in quality of life.  PHQ-9: Review Flowsheet  More data exists      07/18/2024 03/19/2024 09/26/2023 06/26/2023 05/10/2023  Depression screen PHQ 2/9  Decreased Interest 0 2 1 2 2   Down, Depressed, Hopeless 0 2 2 2 2   PHQ - 2 Score 0 4 3 4 4   Altered sleeping 0 3 1 2 2   Tired, decreased energy 3 3 3 2 2   Change in appetite 2 0 2 0 0  Feeling bad or failure about yourself  1 0 1 0 0  Trouble concentrating 0 0 0 0 0  Moving slowly or fidgety/restless 0 0 0 0 0  Suicidal thoughts 0 0 - 0 0  PHQ-9 Score 6 10  10  8  8    Difficult doing work/chores Somewhat difficult Very difficult Very difficult Very difficult Very difficult    Details        Data saved with a previous flowsheet row definition        Interpretation of Total Score  Total Score Depression Severity:  1-4 = Minimal depression, 5-9 = Mild depression, 10-14 = Moderate depression, 15-19 = Moderately severe depression, 20-27 = Severe depression   Psychosocial Evaluation and Intervention:   Psychosocial Re-Evaluation:  Psychosocial Re-Evaluation     Row Name 08/06/24 1130             Psychosocial Re-Evaluation   Current issues with Current Stress Concerns;Current Psychotropic Meds;History  of Depression;Current Anxiety/Panic       Comments Cornelia has not voiced any increased psychosocial concerns or stressors during exercise at cardiac rehab.       Expected Outcomes Mirca will have controlled/reduced stress and anxiety by completion of cardiac rehab.       Interventions Stress management education;Relaxation education;Encouraged to attend Cardiac Rehabilitation for the exercise       Continue Psychosocial Services  Follow up required by staff          Psychosocial Discharge (Final Psychosocial Re-Evaluation):  Psychosocial Re-Evaluation - 08/06/24 1130       Psychosocial Re-Evaluation   Current issues with Current Stress Concerns;Current Psychotropic Meds;History of Depression;Current Anxiety/Panic    Comments Aileana has not voiced any increased psychosocial concerns or stressors during exercise at cardiac rehab.    Expected Outcomes Gerrica will have controlled/reduced stress and anxiety by completion of cardiac rehab.    Interventions Stress management education;Relaxation education;Encouraged to attend Cardiac Rehabilitation for the exercise    Continue Psychosocial Services  Follow up required by staff          Vocational Rehabilitation: Provide vocational rehab assistance to qualifying candidates.   Vocational Rehab Evaluation & Intervention:  Vocational Rehab - 07/18/24 1508       Initial Vocational Rehab Evaluation & Intervention    Assessment shows need for Vocational Rehabilitation No   Maryelizabeth is retired and does not need vocational rehab at this time         Education: Education Goals: Education classes will be provided on a variety of topics geared toward better understanding of heart health and risk factor modification. Participant will state understanding/return demonstration of topics presented as noted by education test scores.  Learning Barriers/Preferences:  Learning Barriers/Preferences - 07/18/24 1532       Learning Barriers/Preferences   Learning Barriers Sight    Learning Preferences Audio;Computer/Internet;Group Instruction;Individual Instruction;Pictoral;Skilled Demonstration;Verbal Instruction;Video;Written Material          General Cardiac Education Topics:  AED/CPR: - Group verbal and written instruction with the use of models to demonstrate the basic use of the AED with the basic ABC's of resuscitation.   Test and Procedures: - Group verbal and visual presentation and models provide information about basic cardiac anatomy and function. Reviews the testing methods done to diagnose heart disease and the outcomes of the test results. Describes the treatment choices: Medical Management, Angioplasty, or Coronary Bypass Surgery for treating various heart conditions including Myocardial Infarction, Angina, Valve Disease, and Cardiac Arrhythmias. Written material provided at class time.   Medication Safety: - Group verbal and visual instruction to review commonly prescribed medications for heart and lung disease. Reviews the medication, class of the drug, and side effects. Includes the steps to properly store meds and maintain the prescription regimen. Written material provided at class time.   Intimacy: - Group verbal instruction through game format to discuss how heart and lung disease can affect sexual intimacy. Written material provided at class time.   Know Your Numbers and Heart  Failure: - Group verbal and visual instruction to discuss disease risk factors for cardiac and pulmonary disease and treatment options.  Reviews associated critical values for Overweight/Obesity, Hypertension, Cholesterol, and Diabetes.  Discusses basics of heart failure: signs/symptoms and treatments.  Introduces Heart Failure Zone chart for action plan for heart failure. Written material provided at class time.   Infection Prevention: - Provides verbal and written material to individual with discussion of infection control including proper hand washing and proper  equipment cleaning during exercise session.   Falls Prevention: - Provides verbal and written material to individual with discussion of falls prevention and safety.   Other: -Provides group and verbal instruction on various topics (see comments)   Knowledge Questionnaire Score:  Knowledge Questionnaire Score - 07/18/24 1534       Knowledge Questionnaire Score   Pre Score 21/24          Core Components/Risk Factors/Patient Goals at Admission:  Personal Goals and Risk Factors at Admission - 07/18/24 1534       Core Components/Risk Factors/Patient Goals on Admission   Hypertension Yes    Intervention Provide education on lifestyle modifcations including regular physical activity/exercise, weight management, moderate sodium restriction and increased consumption of fresh fruit, vegetables, and low fat dairy, alcohol moderation, and smoking cessation.;Monitor prescription use compliance.    Expected Outcomes Short Term: Continued assessment and intervention until BP is < 140/51mm HG in hypertensive participants. < 130/51mm HG in hypertensive participants with diabetes, heart failure or chronic kidney disease.;Long Term: Maintenance of blood pressure at goal levels.    Lipids Yes    Intervention Provide education and support for participant on nutrition & aerobic/resistive exercise along with prescribed medications to achieve  LDL 70mg , HDL >40mg .    Expected Outcomes Short Term: Participant states understanding of desired cholesterol values and is compliant with medications prescribed. Participant is following exercise prescription and nutrition guidelines.;Long Term: Cholesterol controlled with medications as prescribed, with individualized exercise RX and with personalized nutrition plan. Value goals: LDL < 70mg , HDL > 40 mg.    Stress Yes    Intervention Offer individual and/or small group education and counseling on adjustment to heart disease, stress management and health-related lifestyle change. Teach and support self-help strategies.;Refer participants experiencing significant psychosocial distress to appropriate mental health specialists for further evaluation and treatment. When possible, include family members and significant others in education/counseling sessions.    Expected Outcomes Short Term: Participant demonstrates changes in health-related behavior, relaxation and other stress management skills, ability to obtain effective social support, and compliance with psychotropic medications if prescribed.;Long Term: Emotional wellbeing is indicated by absence of clinically significant psychosocial distress or social isolation.          Education:Diabetes - Individual verbal and written instruction to review signs/symptoms of diabetes, desired ranges of glucose level fasting, after meals and with exercise. Acknowledge that pre and post exercise glucose checks will be done for 3 sessions at entry of program.   Core Components/Risk Factors/Patient Goals Review:   Goals and Risk Factor Review     Row Name 08/06/24 1133             Core Components/Risk Factors/Patient Goals Review   Personal Goals Review Hypertension;Lipids;Stress       Review Schuyler is doing well with exercise at cardiac rehab. Vital signs stable. Tamirra has already begun to increased her MET levels in her first seven rehab sessions.        Expected Outcomes Brittani will continue to particpate in cardiac rehab for exercise nutriton and lifestyle modifications.          Core Components/Risk Factors/Patient Goals at Discharge (Final Review):   Goals and Risk Factor Review - 08/06/24 1133       Core Components/Risk Factors/Patient Goals Review   Personal Goals Review Hypertension;Lipids;Stress    Review Quinesha is doing well with exercise at cardiac rehab. Vital signs stable. Sheran has already begun to increased her MET levels in her first seven rehab  sessions.    Expected Outcomes Emmalie will continue to particpate in cardiac rehab for exercise nutriton and lifestyle modifications.          ITP Comments:  ITP Comments     Row Name 07/18/24 1402 07/22/24 1405 08/06/24 1025       ITP Comments Dr Wilbert Bihari MD, Medical Director. [facility]/ Intensive Cardiac Rehab. Initial Orientation Packet Reviewed with the patient. 30 Day ITP Review. Dalisha started cardiac rehab on 07/22/24 and tolerated exercising well. 30 Day ITP Review. Noell started cardiac rehab on 07/22/24, is tolerating exercise well, and is off to a good start.        Comments: see ITP comments

## 2024-08-06 NOTE — Progress Notes (Signed)
 [facility]  Patient ID: Becky Lawson,  MRN: [id number]  PCP: Becky Reyes SAUNDERS, MD  Date of Evaluation: 08/06/2024 Time spent:30 minutes  Chief Complaint:   HISTORY/CURRENT STATUS: HPI Becky Lawson, 73 year old female  presents for follow-up and medication management. No manic episodes since last visit.  Continue to present with good stability.  She is ambulating much better.  Does not seem as short of breath.  Reports that her medications are working well and requesting no medication adjustments or changes today says her anxiety today is 3/10 and depression is 3/10. She was sleeping  7 hours per night with aid of medication. She denies mania, no psychosis, NO SI/HI.   Past medications for mental health diagnoses include: Lithium , Seroquel , Effexor, Prozac, Latuda , Abilify, Dexedrine , Cymbalta  caused weight gain.  Modafinil . Individual Medical History/ Review of Systems: Changes? :No   Allergies: Patient has no known allergies.  Current Medications:  Current Outpatient Medications:    ALPRAZolam  (XANAX ) 1 MG tablet, TAKE 1/2 TO 1 TABLET BY MOUTH twice daily AS NEEDED FOR ANXIETY, Disp: 30 tablet, Rfl: 0   aspirin  EC 81 MG tablet, Take 1 tablet (81 mg total) by mouth daily. Swallow whole., Disp: 30 tablet, Rfl: 12   atorvastatin  (LIPITOR ) 80 MG tablet, Take 1 tablet (80 mg total) by mouth daily., Disp: 90 tablet, Rfl: 3   clopidogrel  (PLAVIX ) 75 MG tablet, Take 1 tablet (75 mg total) by mouth daily with breakfast., Disp: 90 tablet, Rfl: 3   docusate sodium  (COLACE) 100 MG capsule, Take 1 capsule (100 mg total) by mouth 2 (two) times daily., Disp: 10 capsule, Rfl: 0   ezetimibe  (ZETIA ) 10 MG tablet, Take 1 tablet (10 mg total) by mouth daily., Disp: 90 tablet, Rfl: 3   famotidine  (PEPCID ) 20 MG tablet, Take 1 tablet (20 mg total) by mouth daily., Disp: 90 tablet, Rfl: 0   Fe Fum-Vit C-Vit B12-FA (TRIGELS-F FORTE) CAPS capsule, Take 1 capsule by mouth daily after breakfast.,  Disp: , Rfl:    lamoTRIgine  (LAMICTAL ) 100 MG tablet, TAKE TWO TABLETS BY MOUTH EVERY MORNING and TAKE ONE TABLET BY MOUTH EVERY EVENING, Disp: 270 tablet, Rfl: 1   levothyroxine  (SYNTHROID ) 100 MCG tablet, TAKE 1 TABLET BY MOUTH DAILY BEFORE BREAKFAST, Disp: 90 tablet, Rfl: 1   losartan  (COZAAR ) 25 MG tablet, Take 1 tablet (25 mg total) by mouth daily., Disp: 90 tablet, Rfl: 3   metoprolol  tartrate (LOPRESSOR ) 25 MG tablet, Take 1 tablet (25 mg total) by mouth 2 (two) times daily., Disp: 180 tablet, Rfl: 3   oxyCODONE -acetaminophen  (PERCOCET) 10-325 MG tablet, Take 1 tablet by mouth every 6 (six) hours as needed for pain., Disp: 28 tablet, Rfl: 0   polyethylene glycol (MIRALAX  / GLYCOLAX ) 17 g packet, Take 17 g by mouth daily as needed for mild constipation., Disp: 14 each, Rfl: 0   QUEtiapine  (SEROQUEL ) 50 MG tablet, Take 1 tablet (50 mg total) by mouth daily., Disp: 90 tablet, Rfl: 1   traZODone  (DESYREL ) 150 MG tablet, TAKE TWO TABLETS BY MOUTH everyday AT bedtime, Disp: 180 tablet, Rfl: 1   Vitamin D , Ergocalciferol , (DRISDOL ) 1.25 MG (50000 UNIT) CAPS capsule, Take 1 capsule (50,000 Units total) by mouth every 7 (seven) days. Sunday (Patient taking differently: Take 50,000 Units by mouth every Sunday.), Disp: 12 capsule, Rfl: 3 Medication Side Effects: none  Family Medical/ Social History: Changes? No  MENTAL HEALTH EXAM:  There were no vitals taken for this visit.There is no height  or weight on file to calculate BMI.  General Appearance: Casual and Neat  Eye Contact:  Good  Speech:  Clear and Coherent  Volume:  Normal  Mood:  NA  Affect:  Appropriate  Thought Process:  Coherent  Orientation:  Full (Time, Place, and Person)  Thought Content: Logical   Suicidal Thoughts:  No  Homicidal Thoughts:  No  Memory:  WNL  Judgement:  Good  Insight:  Good  Psychomotor Activity:  Normal  Concentration:  Concentration: Good  Recall:  Good  Fund of Knowledge: Good  Language: Good   Assets:  Desire for Improvement  ADL's:  Intact  Cognition: WNL  Prognosis:  Good    DIAGNOSES:    ICD-10-CM   1. Bipolar I disorder, most recent episode depressed (HCC)  F31.30 lamoTRIgine  (LAMICTAL ) 100 MG tablet    2. Generalized anxiety disorder  F41.1 traZODone  (DESYREL ) 150 MG tablet    QUEtiapine  (SEROQUEL ) 50 MG tablet    3. Insomnia due to other mental disorder  F51.05 traZODone  (DESYREL ) 150 MG tablet   F99 QUEtiapine  (SEROQUEL ) 50 MG tablet    4. Bipolar 1 disorder, mixed, moderate (HCC)  F31.62 QUEtiapine  (SEROQUEL ) 50 MG tablet      Receiving Psychotherapy: No    RECOMMENDATIONS:   Greater than 50% of 30 min of face to face time  was spent on counseling and coordination of care. Discussed her continued stability. She request no medication changes this visit.  She is doing very well post open heart surgery.  Still participating in cardiac rehab.  We will follow up after she has had some time to recover.  No questions or concerns today.    -Conducted AIMS  Score of 1 today.  08/06/2024 Continue Seroquel  50 mg daily at bedtime Continue hydroxyzine  25 mg nightly 8H as needed. Continue Lamictal   300 mg daily. 200 mg in the am and 100 mg in the evening. Continue the  trazodone  300 mg, 1.5 pills nightly.  Continue Xanax  1 mg at bedtime but if taking  Pt to schedule follow up after cardiology  further assesses Discussed potential benefits, risks, and side effects of stimulants with patient to include increased heart rate, palpitations, insomnia, increased anxiety, increased irritability, or decreased appetite.  Instructed patient to contact office if experiencing any significant tolerability issues.  Discussed potential metabolic side effects associated with atypical antipsychotics, as well as potential risk for movement side effects. Advised pt to contact office if movement side effects occur.   PDMP was reviewed      Becky DELENA Pizza, NP

## 2024-08-07 ENCOUNTER — Encounter (HOSPITAL_COMMUNITY)

## 2024-08-09 ENCOUNTER — Encounter (HOSPITAL_COMMUNITY)
Admission: RE | Admit: 2024-08-09 | Discharge: 2024-08-09 | Disposition: A | Source: Ambulatory Visit | Attending: Cardiology

## 2024-08-09 DIAGNOSIS — Z951 Presence of aortocoronary bypass graft: Secondary | ICD-10-CM | POA: Diagnosis not present

## 2024-08-11 NOTE — Progress Notes (Unsigned)
 Cardiology Office Note    Date:  08/12/2024  ID:  Becky Lawson, DOB 10/24/1950, MRN [id number] PCP:  Levora Reyes SAUNDERS, MD  Cardiologist:  Gordy Bergamo, MD  Electrophysiologist:  None   Chief Complaint: Follow up for CAD   History of Present Illness: .   Becky Lawson is a 73 y.o. female with visit-pertinent history of prediabetes, hypertension, hyperlipidemia, prior tobacco use.  First evaluated on 09/28/2023 by Dr. Alvan for evaluation of chest pain.   At office visit patient reported that she had had intermittent chest pain for the prior 3 weeks, pain described as a sudden, heavy pressure in the middle chest that sometimes radiated to the chin.  Coronary CTA on 10/12/2023 indicated at least moderate stenosis, coronary calcium  score of 3084, 99th percentile for age, sex and race matched controls.  Total plaque volume 1268 mm3, 96 percentile for age and sex matched control.  CT FFR analysis did not show any significant focal stenosis, there was gradual decrease in FFR across LCx system, FFR drops when the vessel becomes smaller in caliber after OM2 but there is no discrete point of stenosis, proximal FFR 0.98, mid FFR after OM two 0.76, distal FFR of 0.7.  Echocardiogram on 11/08/2023 indicated LVEF of 65 to 70%, no RWMA, grade 1 diastolic dysfunction, RV size and function was normal, LA was moderately dilated, aortic valve sclerosis was evident without aortic valve stenosis.   Patient was seen in clinic on 12/25/23 by Dr. Court patient denied any chest pain however did report jaw pain which was nitrate responsive.  A cardiac PET was ordered to further evaluate.  PET stress on 03/27/2024 indicated abnormal LV perfusion with evidence of ischemia, no evidence of infarction.  There was a medium defect with moderate reduction in uptake present in the apical septal and apex locations that was reversible, there was normal wall motion in the defect area, consistent with ischemia, noted to have severe three-vessel  calcium , abnormal MBF T and 3 times daily suggesting three-vessel CAD and not just distal LAD ischemia.  Patient was seen in clinic on 03/28/2024, she reported continued intermittent episodes of chest discomfort, typically resolve with rest or a single sublingual nitroglycerin .  Patient was set up for cardiac catheterization.  Cardiac catheterization on 04/03/24 Indicated severe three-vessel CAD, had successful PCI/DES to the LCx that was complicated by dissection with recommendations for DAPT with ASA/Plavix  and planned for staged intervention of the LAD and RCA in 2 to 3 weeks.  At heart team meeting on 04/05/2024 after presentation was felt the best option would be to proceed with bypass surgery to the LAD, RCA and potentially if there is any restenosis by relook angiogram in 3 months bypass surgery to circumflex marginal.   On 05/17/2024 patient underwent CABG x 2 with left internal mammary artery graft to the LAD and right internal mammary artery graft to the RCA with Dr. Lucas.  During the evening of postop day 1 she developed atrial fibrillation with RVR, she converted back to sinus rhythm on amiodarone  infusion this was transition to p.o. amiodarone  on postop day 2.  Patient was started back on Plavix  given recent stenting.  She then converted back into atrial fibrillation and was treated with 2 amiodarone  boluses with improvement of heart rate.  Patient was discharged on oral amiodarone , aspirin  80 mg daily, Lipitor  80 mg daily, Plavix  75 mg daily, metoprolol  tartrate 50 mg twice daily.  Patient was discharged to SNF on 05/25/24.   Patient  was last seen in clinic on 06/10/2024 for follow-up.  She reported that she been doing well overall.  She noted some residual chest soreness, denied any similarity to her prior chest pain.  She notes some increased dyspnea with prolonged exertion, felt this was related to deconditioning and felt it had been improving.  She had been discharged from SNF.  Today she presents  for follow-up.  She reports that she has been doing well overall.  She denies any chest pain, shortness of breath, lower extremity edema, orthopnea or PND.  She denies any palpitations, presyncope or syncope.  She does note that a few weeks prior she experienced intense right sided jaw pain, slightly reminiscent of her prior discomfort.  She notes that this overall spontaneously resolved, denies any recurrence.  She notes that she has been regularly attending cardiac rehab and is attending [employer], is exercising 5 days a week and is tolerating well without any further jaw discomfort.  She notes that her blood pressure has been steadily increasing following her bypass surgery. ROS: .   Today she denies chest pain, shortness of breath, lower extremity edema, fatigue, palpitations, melena, hematuria, hemoptysis, diaphoresis, weakness, presyncope, syncope, orthopnea, and PND.  All other systems are reviewed and otherwise negative. Studies Reviewed: [name]   EKG:  EKG is not ordered today.  CV Studies: Cardiac studies reviewed are outlined and summarized above. Otherwise please see EMR for full report. Cardiac Studies & Procedures   ______________________________________________________________________________________________ CARDIAC CATHETERIZATION  CARDIAC CATHETERIZATION 04/03/2024  Conclusion Images from the original result were not included. Cardiac Catheterization 04/03/24: Hemodynamic data: LVEDP 35 mm Hg, Markedly elevated, no pressure gradient across the aortic valve.  Angiographic data: LM: Large-caliber vessel, mildly calcified. LAD: It is a moderate large caliber vessel, proximal LAD has diffuse 30 to 40% calcific stenosis followed by a very focal 95% calcific stenosis at the origin of D2 and is tortuous at this segment.  Distal-apical LAD has a 70% focal stenosis. LCx: Codominant with RCA.  Moderately calcified.  There is a very focal 95% calcific stenosis in the midsegment just  proximal to the origin of a very large OM1.  OM1 has a ostial 30 to 40% calcific stenosis. RCA: Codominant with CX, continues as a moderate-sized PL branch with very small PDA, has a focal 90% stenosis at origin of RV branch in the midsegment.  Intervention data: Successful but difficult procedure due to calcification and distal dissection.  PTCA and stenting of the mid CX and OM1 with 2 overlapping 3.5 x 12 in the CX and 3.0 x 12 mm Synergy XD DES overlapping into the OM1, stenosis reduced from 95% to 0% with TIMI-3 to TIMI-3 flow.    Impression and recommendations: Patient has severely complex three-vessel coronary disease and probably would be better suited for CABG.  I attempted to pass the guidewire into the LAD after the CX PCI, and I was unable to pass a 2.5 mm balloon to predilate the lesion.  There was also spasm mid segment Cx and also a small type a dissection was evident postprocedure in the LAD.  I suspect if she needs PCI to the LAD, it is can be a complex long-term intervention with atherectomy.  RCA lesion is very focal.  I have recommended that we observe her overnight for any potential post PCI complication, discharge in the morning, elective staged intervention to RCA and will take a relook at LAD at that time and consider atherectomy followed by stenting of a focal  high-grade mid LAD stenosis avoiding stenting the long proximal segment.  Findings Coronary Findings Diagnostic  Dominance: Co-dominant  Left Anterior Descending Prox LAD to Mid LAD lesion is 40% stenosed. Mid LAD lesion is 95% stenosed. Dist LAD lesion is 70% stenosed.  Left Circumflex Prox Cx to Mid Cx lesion is 95% stenosed. The lesion is type C. The lesion is moderately calcified.  First Obtuse Marginal Branch 1st Mrg lesion is 40% stenosed.  Right Coronary Artery Prox RCA lesion is 90% stenosed.  Intervention  Prox Cx to Mid Cx lesion Angioplasty Lesion length:  8 mm. CATH VISTA GUIDE 6FR XBLD 3.5  guide catheter was inserted. WIRE RUNTHROUGH .[id number] guidewire used to cross lesion. Balloon angioplasty was performed using a BALLOON EMERGE MR 3.5X15. Maximum pressure: 4 atm. Inflation time: 30 sec. Stent A drug-eluting stent was successfully placed using a STENT SYNERGY XD 3.50X12. Maximum pressure: 13 atm. Inflation time: 45 sec. Stent strut is well apposed. Post-Intervention Lesion Assessment The intervention was successful. Pre-interventional TIMI flow is 3. Post-intervention TIMI flow is 3. No complications occurred at this lesion. There is a 0% residual stenosis post intervention.  1st Mrg lesion Stent A drug-eluting stent was successfully placed using a STENT SYNERGY XD 3.0X12. Maximum pressure: 11 atm. Inflation time: 30 sec. Stent strut is well apposed. Stent overlaps previously placed stent. Post-stent angioplasty was performed. Stent balloon used to post dill overlap at 16 atm for 30 S Post-Intervention Lesion Assessment The intervention was successful. Pre-interventional TIMI flow is 3. Post-intervention TIMI flow is 3. At this lesion, a dissection occurred. As I pulled the LAD guide wire, the guide catheter was sucked into the LAD and there was Prox LAD type A dissection without flow compromise and hence left alone due to complexity of the LAD stenosis. There is a 0% residual stenosis post intervention.   STRESS TESTS  NM PET CT CARDIAC PERFUSION MULTI W/ABSOLUTE BLOODFLOW 03/27/2024  Narrative   LV perfusion is abnormal. There is evidence of ischemia. There is no evidence of infarction. Defect 1: There is a medium defect with moderate reduction in uptake present in the apical septal and apex location(s) that is reversible. There is normal wall motion in the defect area. Consistent with ischemia.   Rest left ventricular function is normal. Rest EF: 59%. Stress left ventricular function is normal. Stress EF: 63%. End diastolic cavity size is normal. End systolic cavity size is  normal.   Myocardial blood flow was computed to be 0.39ml/g/min at rest and 1.40ml/g/min at stress. Global myocardial blood flow reserve was 1.52 and was abnormal.   Coronary calcium  was present on the attenuation correction CT images. Severe coronary calcifications were present. Coronary calcifications were present in the left anterior descending artery, left circumflex artery and right coronary artery distribution(s).   Findings are consistent with ischemia. The study is intermediate risk.   Severely reduced MBFR in area of ischemia 0.88   Severe 3 vessel calcium  , abnormal MBFT and TID suggests 3 vessel CAD and not just distal LAD ischemia  EXAM: OVER-READ INTERPRETATION  PET-CT CHEST  The following report is an over-read performed by radiologist Dr. Camellia Lang [facility] on 03/27/2024. This over-read does not include interpretation of cardiac or coronary anatomy or pathology. The cardiac PET and cardiac CT interpretation by the cardiologist is to be attached.  COMPARISON:  None.  FINDINGS: No evidence for lymphadenopathy within the visualized mediastinum or hilar regions.  The visualized lung parenchyma shows no suspicious pulmonary nodule or  mass. No focal airspace consolidation. No effusion.  Visualized portions of the upper abdomen are unremarkable.  No suspicious lytic or sclerotic osseous abnormality.  IMPRESSION: No acute or clinically significant extracardiac findings.   Electronically Signed By: Camellia Candle M.D. On: 03/27/2024 09:25   ECHOCARDIOGRAM  ECHOCARDIOGRAM COMPLETE 11/08/2023  Narrative ECHOCARDIOGRAM REPORT    Patient Name:   Becky Lawson  Date of Exam: 11/08/2023 Medical Rec #:  [id number]     Height:       64.5 in Accession #:    [id number]    Weight:       200.0 lb Date of Birth:  [birth year]     BSA:          1.967 m Patient Age:    72 years      BP:           138/78 mmHg Patient Gender: F             HR:           108  bpm. Exam Location:  [address]  Procedure: 2D Echo, Cardiac Doppler and Color Doppler (Both Spectral and Color Flow Doppler were utilized during procedure).  Indications:    R93.1 CAD  History:        Patient has prior history of Echocardiogram examinations, most recent 06/21/2022. CAD, Signs/Symptoms:Murmur and Chest Pain; Risk Factors:Hypertension, Dyslipidemia and Former Smoker.  Sonographer:    Elsie Bohr RDCS Referring Phys: [phone] MARY E BRANCH  IMPRESSIONS   1. Left ventricular ejection fraction, by estimation, is 65 to 70%. The left ventricle has normal function. The left ventricle has no regional wall motion abnormalities. Left ventricular diastolic parameters are consistent with Grade I diastolic dysfunction (impaired relaxation). 2. Right ventricular systolic function is normal. The right ventricular size is normal. 3. Left atrial size was moderately dilated. 4. The mitral valve is normal in structure. Mild mitral valve regurgitation. No evidence of mitral stenosis. 5. The aortic valve is normal in structure. There is mild calcification of the aortic valve. Aortic valve regurgitation is not visualized. Aortic valve sclerosis/calcification is present, without any evidence of aortic stenosis. 6. There is high flow/turbulence over the pulmonic valve but no evidence of stenosis. 7. The inferior vena cava is normal in size with greater than 50% respiratory variability, suggesting right atrial pressure of 3 mmHg.  FINDINGS Left Ventricle: Left ventricular ejection fraction, by estimation, is 65 to 70%. The left ventricle has normal function. The left ventricle has no regional wall motion abnormalities. The left ventricular internal cavity size was normal in size. There is no left ventricular hypertrophy. Left ventricular diastolic parameters are consistent with Grade I diastolic dysfunction (impaired relaxation).  Right Ventricle: The right ventricular size is normal. No  increase in right ventricular wall thickness. Right ventricular systolic function is normal.  Left Atrium: Left atrial size was moderately dilated.  Right Atrium: Right atrial size was normal in size.  Pericardium: There is no evidence of pericardial effusion.  Mitral Valve: The mitral valve is normal in structure. Mild mitral annular calcification. Mild mitral valve regurgitation. No evidence of mitral valve stenosis.  Tricuspid Valve: The tricuspid valve is normal in structure. Tricuspid valve regurgitation is mild . No evidence of tricuspid stenosis.  Aortic Valve: The aortic valve is normal in structure. There is mild calcification of the aortic valve. Aortic valve regurgitation is not visualized. Aortic valve sclerosis/calcification is present, without any evidence of aortic stenosis.  Pulmonic Valve: The pulmonic  valve was normal in structure. Pulmonic valve regurgitation is not visualized. No evidence of pulmonic stenosis.  Aorta: The aortic root is normal in size and structure.  Pulmonary Artery: There is high flow/turbulence over the pulmonic valve but no evidence of stenosis.  Venous: The inferior vena cava is normal in size with greater than 50% respiratory variability, suggesting right atrial pressure of 3 mmHg.  IAS/Shunts: No atrial level shunt detected by color flow Doppler.   LEFT VENTRICLE PLAX 2D LVIDd:         3.60 cm   Diastology LVIDs:         2.50 cm   LV e' medial:    9.25 cm/s LV PW:         1.20 cm   LV E/e' medial:  11.4 LV IVS:        1.50 cm   LV e' lateral:   17.30 cm/s LVOT diam:     2.10 cm   LV E/e' lateral: 6.1 LV SV:         90 LV SV Index:   46 LVOT Area:     3.46 cm   RIGHT VENTRICLE             IVC RV S prime:     19.20 cm/s  IVC diam: 0.80 cm TAPSE (M-mode): 1.9 cm RVSP:           26.2 mmHg  LEFT ATRIUM             Index        RIGHT ATRIUM           Index LA diam:        4.10 cm 2.08 cm/m   RA Pressure: 3.00 mmHg LA Vol (A2C):    69.8 ml 35.49 ml/m  RA Area:     13.60 cm LA Vol (A4C):   78.3 ml 39.81 ml/m  RA Volume:   32.80 ml  16.68 ml/m LA Biplane Vol: 79.7 ml 40.52 ml/m AORTIC VALVE LVOT Vmax:   136.00 cm/s LVOT Vmean:  94.600 cm/s LVOT VTI:    0.260 m  AORTA Ao Root diam: 3.30 cm Ao Asc diam:  3.50 cm  MITRAL VALVE                TRICUSPID VALVE MV Area (PHT): 2.87 cm     TR Peak grad:   23.2 mmHg MV Decel Time: 264 msec     TR Vmax:        241.00 cm/s MV E velocity: 105.00 cm/s  Estimated RAP:  3.00 mmHg MV A velocity: 134.00 cm/s  RVSP:           26.2 mmHg MV E/A ratio:  0.78 SHUNTS Systemic VTI:  0.26 m Systemic Diam: 2.10 cm  Toribio Fuel MD Electronically signed by Toribio Fuel MD Signature Date/Time: 11/08/2023/3:36:55 PM    Final   TEE  ECHO INTRAOPERATIVE TEE 05/17/2024  Narrative *INTRAOPERATIVE TRANSESOPHAGEAL REPORT *    Patient Name:   Becky Lawson Date of Exam: 05/17/2024 Medical Rec #:  [id number]    Height:       64.5 in Accession #:    [id number]   Weight:       190.0 lb Date of Birth:  1951-04-25    BSA:          1.92 m Patient Age:    73 years     BP:  158/80 mmHg Patient Gender: F            HR:           66 bpm. Exam Location:  Inpatient  Transesophogeal exam was perform intraoperatively during surgical procedure. Patient was closely monitored under general anesthesia during the entirety of examination.  Indications:     CAD, multiple vessel I25.10 Sonographer:     Koleen Popper RDCS Performing Phys: [id number] BARTLE Diagnosing Phys: Lynwood Cornea  Complications: No known complications during this procedure. POST-OP IMPRESSIONS _ Left Ventricle: The left ventricle is unchanged from pre-bypass. Normal systolic function. _ Right Ventricle: The right ventricle appears unchanged from pre-bypass; normal function. _ Aorta: The aorta appears unchanged from pre-bypass. There is no dissection present in the aorta. _ Left Atrial Appendage: The  left atrial appendage appears unchanged from pre-bypass. _ Aortic Valve: The aortic valve appears unchanged from pre-bypass. Trace regurgitation. _ Mitral Valve: The mitral valve appears unchanged from pre-bypass. There is mild regurgitation. _ Tricuspid Valve: The tricuspid valve appears unchanged from pre-bypass. There is mild regurgitation. _ Pulmonic Valve: The pulmonic valve appears unchanged from pre-bypass. _ Interatrial Septum: The interatrial septum appears unchanged from pre-bypass. _ Pericardium: The pericardium appears unchanged from pre-bypass. _ Comments: No significant pleural or pericardial effusion.  PRE-OP FINDINGS Left Ventricle: The left ventricle has normal systolic function, with an ejection fraction of 55-60%. The cavity size was normal. There is mild concentric left ventricular hypertrophy.  Right Ventricle: The right ventricle has normal systolic function. The cavity was normal. There is no increase in right ventricular wall thickness. Catheter present in the right ventricle.  Left Atrium: Left atrial size was not assessed. No left atrial/left atrial appendage thrombus was detected.  Right Atrium: Right atrial size was not assessed.  Interatrial Septum: No atrial level shunt detected by color flow Doppler.  Pericardium: There is no evidence of pericardial effusion. There is no pleural effusion.  Mitral Valve: The mitral valve is normal in structure. Mitral valve regurgitation is mild by color flow Doppler. There is no evidence of mitral stenosis.  Tricuspid Valve: The tricuspid valve was normal in structure. Tricuspid valve regurgitation is mild by color flow Doppler. No evidence of tricuspid stenosis is present.  Aortic Valve: The aortic valve is tricuspid. Aortic valve regurgitation is trivial by color flow Doppler. There is no stenosis of the aortic valve, with a calculated valve area of 2.42 cm. There is moderate calcification present on the aortic valve  left coronary cusp with mildly decreased mobility.  Pulmonic Valve: The pulmonic valve was normal in structure No evidence of pumonic stenosis. Pulmonic valve regurgitation is trivial by color flow Doppler.   Aorta: The aortic root, ascending aorta and aortic arch are normal in size and structure. There is evidence of plaque in the descending aorta; Grade IV, measuring >26mm in size.  +--------------+--------++ LEFT VENTRICLE          +----------------+---------++ +--------------+--------++  Diastology                PLAX 2D                 +----------------+---------++ +--------------+--------++  LV e' lateral:  7.41 cm/s LVOT diam:    2.30 cm   +----------------+---------++ +--------------+--------++  LV E/e' lateral:9.5       LVOT Area:    4.15 cm  +----------------+---------++ +--------------+--------++                        +--------------+--------++  +---------------+------+-------+  RIGHT VENTRICLE              +---------------+------+-------+ TAPSE (M-mode):1.7 cm2.37 cm +---------------+------+-------+  +------------------+------------++ AORTIC VALVE                   +------------------+------------++ AV Area (Vmax):   2.63 cm     +------------------+------------++ AV Area (Vmean):  2.41 cm     +------------------+------------++ AV Area (VTI):    2.42 cm     +------------------+------------++ AV Vmax:          157.00 cm/s  +------------------+------------++ AV Vmean:         111.000 cm/s +------------------+------------++ AV VTI:           0.347 m      +------------------+------------++ AV Peak Grad:     9.9 mmHg     +------------------+------------++ AV Mean Grad:     6.0 mmHg     +------------------+------------++ LVOT Vmax:        99.50 cm/s   +------------------+------------++ LVOT Vmean:       64.300 cm/s  +------------------+------------++ LVOT VTI:         0.202 m       +------------------+------------++ LVOT/AV VTI ratio:0.58         +------------------+------------++  +--------------+----------++ MITRAL VALVE              +--------------+-------+ +--------------+----------++  SHUNTS                MV Area (PHT):3.12 cm    +--------------+-------+ +--------------+----------++  Systemic VTI: 0.20 m  MV PHT:       70.47 msec  +--------------+-------+ +--------------+----------++  Systemic Diam:2.30 cm MV Decel Time:243 msec    +--------------+-------+ +--------------+----------++ +--------------+----------++ MV E velocity:70.20 cm/s +--------------+----------++ MV A velocity:60.90 cm/s +--------------+----------++ MV E/A ratio: 1.15       +--------------+----------++   Lynwood Cornea Electronically signed by Lynwood Cornea Signature Date/Time: 05/17/2024/3:32:28 PM    Final    CT SCANS  CT CORONARY MORPH W/CTA COR W/SCORE 10/12/2023  Addendum 10/26/2023  9:16 PM ADDENDUM REPORT: 10/26/2023 21:13  EXAM: OVER-READ INTERPRETATION  CT CHEST  The following report is an over-read performed by radiologist Dr. Fonda Mom [facility] on 10/26/2023. This over-read does not include interpretation of cardiac or coronary anatomy or pathology. The coronary CTA interpretation by the cardiologist is attached.  COMPARISON:  None.  FINDINGS: Cardiovascular:  See findings discussed in the body of the report.  Mediastinum/Nodes: No suspicious adenopathy identified. Imaged mediastinal structures are unremarkable.  Lungs/Pleura: There is dependent basilar subsegmental atelectasis. No pneumonia or pulmonary edema. No pleural effusion or pneumothorax.  Upper Abdomen: No acute abnormality.  Musculoskeletal: No chest wall abnormality. No acute osseous findings. There are thoracic degenerative changes.  IMPRESSION:  No acute extracardiac incidental findings.   Electronically  Signed By: Fonda Field M.D. On: 10/26/2023 21:13  Narrative HISTORY: Chest pain/anginal equiv, intermediate CAD risk, treadmill candidate  EXAM: Cardiac/Coronary CT  TECHNIQUE: The patient was scanned on a [employer].  PROTOCOL: A 120 kV prospective scan was triggered in the descending thoracic aorta at 111 HU's. Axial non-contrast 3 mm slices were carried out through the heart. The data set was analyzed on a dedicated work station and scored using the Agatston method. Gantry rotation speed was 250 msecs and collimation was 0.6 mm. Heart rate was optimized medically and sl NTG was given. The 3D data set was reconstructed in 5% intervals of the 35-75 % of the R-R cycle. Systolic and diastolic phases were  analyzed on a dedicated work station using MPR, MIP and VRT modes. The patient received 95mL OMNIPAQUE  IOHEXOL  350 MG/ML SOLN of contrast.  FINDINGS: Coronary calcium  score: The patient's coronary artery calcium  score is 3084, which places the patient in the 99th percentile.  Coronary arteries: Normal coronary origins.  Co- dominance.  Right Coronary Artery: Normal caliber vessel, gives rise to right PDA. Diffuse mixed calcified and noncalcified plaque throughout primarily proximal vessel. Maximum stenosis 25-49% proximal, 1-24% mid, and 1-24% distal.  Left Main Coronary Artery: Normal caliber vessel. No significant plaque or stenosis.  Left Anterior Descending Coronary Artery: Normal caliber vessel. Diffuse mixed calcified and noncalcified plaque throughout vessel. Maximum stenosis 25-49% proximal, 50-69% mid, and 50-69% distal. Gives rise to two small diagonal branches.  Left Circumflex Artery: Normal caliber vessel, gives rise to left PDA. Diffuse mixed calcified and noncalcified plaque throughout vessel. Maximum stenosis 25-49% proximal, 25-49% mid, and 50-69% distal. Gives rise to small first, large second, medium third OM branches.  Aorta:  Normal size, 35 mm at the mid ascending aorta (level of the PA bifurcation) measured double oblique. Aortic atherosclerosis. No dissection seen in visualized portions of the aorta.  Aortic Valve: Mild calcifications. Trileaflet.  Other findings:  Normal pulmonary vein drainage into the left atrium.  Normal left atrial appendage without a thrombus.  Normal size of the pulmonary artery.  Normal appearance of the pericardium.  IMPRESSION: 1. CAD with at least moderate stenosis, CADRADS = 3. CT FFR will be performed and reported separately. Heavy calcification limits interpretation in certain segments.  2. Coronary calcium  score of 3084. This was 99th percentile for age-, sex-, and race- matched controls.  3. Total plaque volume 1268 mm3 which is 96th percentile for age- and sex- matched controls (calcified plaque 460 mm3; noncalcified plaque 808 mm3). Total plaque volume is extensive.  4. Normal coronary origin with co- dominance.  INTERPRETATION:  CAD-RADS 3: Moderate stenosis (50-69%). Consider symptom-guided anti-ischemic pharmacotherapy as well as risk factor modification per guideline directed care. Additional analysis with CT FFR will be submitted.  Electronically Signed: By: Shelda Bruckner M.D. On: 10/13/2023 14:14     ______________________________________________________________________________________________       Current Reported Medications:.    Current Meds  Medication Sig   ALPRAZolam  (XANAX ) 1 MG tablet TAKE 1/2 TO 1 TABLET BY MOUTH twice daily AS NEEDED FOR ANXIETY   aspirin  EC 81 MG tablet Take 1 tablet (81 mg total) by mouth daily. Swallow whole.   atorvastatin  (LIPITOR ) 80 MG tablet Take 1 tablet (80 mg total) by mouth daily.   clopidogrel  (PLAVIX ) 75 MG tablet Take 1 tablet (75 mg total) by mouth daily with breakfast.   docusate sodium  (COLACE) 100 MG capsule Take 1 capsule (100 mg total) by mouth 2 (two) times daily.   ezetimibe   (ZETIA ) 10 MG tablet Take 1 tablet (10 mg total) by mouth daily.   famotidine  (PEPCID ) 20 MG tablet Take 1 tablet (20 mg total) by mouth daily.   Fe Fum-Vit C-Vit B12-FA (TRIGELS-F FORTE) CAPS capsule Take 1 capsule by mouth daily after breakfast.   lamoTRIgine  (LAMICTAL ) 100 MG tablet TAKE TWO TABLETS BY MOUTH EVERY MORNING and TAKE ONE TABLET BY MOUTH EVERY EVENING   levothyroxine  (SYNTHROID ) 100 MCG tablet TAKE 1 TABLET BY MOUTH DAILY BEFORE BREAKFAST   losartan  (COZAAR ) 25 MG tablet Take 1 tablet (25 mg total) by mouth daily.   metoprolol  tartrate (LOPRESSOR ) 25 MG tablet Take 1 tablet (25 mg total) by mouth 2 (two)  times daily.   oxyCODONE -acetaminophen  (PERCOCET) 10-325 MG tablet Take 1 tablet by mouth every 6 (six) hours as needed for pain.   polyethylene glycol (MIRALAX  / GLYCOLAX ) 17 g packet Take 17 g by mouth daily as needed for mild constipation.   QUEtiapine  (SEROQUEL ) 50 MG tablet Take 1 tablet (50 mg total) by mouth daily.   traZODone  (DESYREL ) 150 MG tablet TAKE TWO TABLETS BY MOUTH everyday AT bedtime   Vitamin D , Ergocalciferol , (DRISDOL ) 1.25 MG (50000 UNIT) CAPS capsule Take 1 capsule (50,000 Units total) by mouth every 7 (seven) days. Sunday (Patient taking differently: Take 50,000 Units by mouth every Sunday.)   Physical Exam:   VS:  BP (!) 148/74   Pulse 71   Ht 5' 4 (1.626 m)   Wt 182 lb 3.2 oz (82.6 kg)   SpO2 96%   BMI 31.27 kg/m    Wt Readings from Last 3 Encounters:  08/12/24 182 lb 3.2 oz (82.6 kg)  07/18/24 182 lb 15.7 oz (83 kg)  07/16/24 195 lb (88.5 kg)    GEN: Well nourished, well developed in no acute distress NECK: No JVD; No carotid bruits CARDIAC: RRR, no murmurs, rubs, gallops RESPIRATORY:  Clear to auscultation without rales, wheezing or rhonchi  ABDOMEN: Soft, non-tender, non-distended EXTREMITIES:  No edema; No acute deformity     Asessement and Plan:.    CAD: Cardiac catheterization on 04/03/24 Indicated severe three-vessel CAD, had  successful PCI/DES to the LCx that was complicated by dissection with recommendations for DAPT with ASA/Plavix  and planned for staged intervention of the LAD and RCA in 2 to 3 weeks.  At heart team meeting on 04/05/2024 after presentation was felt the best option would be to proceed with bypass surgery to the LAD, RCA.  Patient underwent CABG x 2 with left internal mammary artery graft to the LAD and right internal mammary artery graft to the RCA with Dr. Lucas on 05/17/24. Today she reports that she has been doing well overall.  She denies any chest pain, shortness of breath, lower extremity edema.  She does note some jaw pain that lasted for a few days then spontaneously resolved.  Denies any recurrence.  She has been attending cardiac rehab and Silver sneakers, exercising 5 days a week and tolerating well without any anginal symptoms.  She will continue to monitor for recurrent jaw pain. Continue aspirin  81 mg daily, Lipitor  80 mg daily, Plavix  75 mg daily, Zetia  10 mg daily, losartan  25 mg daily and metoprolol  tartrate 25 mg twice daily.  Will start patient on amlodipine  5 mg daily. Reviewed ED precautions.  Postoperative atrial fibrillation: Patient with postoperative atrial fibrillation.  She was on amiodarone  until 10/20.  Today she denies any palpitations or feeling of irregular heartbeats.  Rate and rhythm regular on auscultation.  Will plan for patient to wear 1 week ZIO monitor on follow-up.  Hypertension: Blood pressure today initially 150/76, on recheck 148/74.  Patient notes that her blood pressure has been steadily climbing since her surgery.  Reviewed that she was on more blood pressure medications prior to surgery.  Will restart amlodipine  5 mg daily.  Continue losartan  25 mg daily and metoprolol  tartrate 25 mg twice daily.  Encouraged patient to continue monitoring blood pressure.  Hyperlipidemia: Last lipid file in 04/24/2024 indicated total cholesterol 130, HDL 49, triglycerides 89 and LDL 64.   Continue Lipitor  80 mg daily and Zetia  10 mg daily.   Disposition: F/u with Opie Fanton, NP in four weeks.  Signed, Andera Cranmer D Remedy Corporan, NP

## 2024-08-12 ENCOUNTER — Encounter: Payer: Self-pay | Admitting: Cardiology

## 2024-08-12 ENCOUNTER — Ambulatory Visit: Attending: Cardiology | Admitting: Cardiology

## 2024-08-12 VITALS — BP 148/74 | HR 71 | Ht 64.0 in | Wt 182.2 lb

## 2024-08-12 DIAGNOSIS — I480 Paroxysmal atrial fibrillation: Secondary | ICD-10-CM | POA: Diagnosis not present

## 2024-08-12 DIAGNOSIS — I2510 Atherosclerotic heart disease of native coronary artery without angina pectoris: Secondary | ICD-10-CM

## 2024-08-12 DIAGNOSIS — I10 Essential (primary) hypertension: Secondary | ICD-10-CM

## 2024-08-12 DIAGNOSIS — Z951 Presence of aortocoronary bypass graft: Secondary | ICD-10-CM

## 2024-08-12 DIAGNOSIS — E782 Mixed hyperlipidemia: Secondary | ICD-10-CM

## 2024-08-12 MED ORDER — AMLODIPINE BESYLATE 5 MG PO TABS: 5.0000 mg | ORAL_TABLET | Freq: Every day | ORAL | 3 refills | Status: AC

## 2024-08-12 NOTE — Patient Instructions (Signed)
 Medication Instructions:  START: Amlodipine  5 mg (1 tablet) daily  *If you need a refill on your cardiac medications before your next appointment, please call your pharmacy*  Lab Work: NONE If you have labs (blood work) drawn today and your tests are completely normal, you will receive your results only by: MyChart Message (if you have MyChart) OR A paper copy in the mail If you have any lab test that is abnormal or we need to change your treatment, we will call you to review the results.  Testing/Procedures: NONE  Follow-Up: At [facility], you and your health needs are our priority.  As part of our continuing mission to provide you with exceptional heart care, our providers are all part of one team.  This team includes your primary Cardiologist (physician) and Advanced Practice Providers or APPs (Physician Assistants and Nurse Practitioners) who all work together to provide you with the care you need, when you need it.  Your next appointment:   4 week(s)  Provider:   Katlyn West, NP

## 2024-08-14 ENCOUNTER — Encounter: Payer: Self-pay | Admitting: Family Medicine

## 2024-08-14 ENCOUNTER — Ambulatory Visit: Admitting: Family Medicine

## 2024-08-14 ENCOUNTER — Encounter (HOSPITAL_COMMUNITY)
Admission: RE | Admit: 2024-08-14 | Discharge: 2024-08-14 | Disposition: A | Discharge status: Home / Self Care | Source: Ambulatory Visit | Attending: Cardiology | Admitting: Cardiology

## 2024-08-14 VITALS — BP 138/64 | HR 68 | Temp 98.3°F | Resp 15 | Ht 64.0 in | Wt 181.4 lb

## 2024-08-14 DIAGNOSIS — I10 Essential (primary) hypertension: Secondary | ICD-10-CM | POA: Diagnosis not present

## 2024-08-14 DIAGNOSIS — E890 Postprocedural hypothyroidism: Secondary | ICD-10-CM | POA: Diagnosis not present

## 2024-08-14 DIAGNOSIS — R7989 Other specified abnormal findings of blood chemistry: Secondary | ICD-10-CM | POA: Diagnosis not present

## 2024-08-14 DIAGNOSIS — Z951 Presence of aortocoronary bypass graft: Secondary | ICD-10-CM

## 2024-08-14 DIAGNOSIS — I2510 Atherosclerotic heart disease of native coronary artery without angina pectoris: Secondary | ICD-10-CM | POA: Diagnosis not present

## 2024-08-14 NOTE — Patient Instructions (Signed)
 Glad you are doing well.  I will check your vitamin D  and thyroid  test but no other labs needed today.  Blood pressure looks like it is heading in the right direction with the new dose of amlodipine .  No med changes from my standpoint today.  Keep follow-up with cardiology as planned and continue cardiac rehab as planned.  Take care!

## 2024-08-14 NOTE — Progress Notes (Signed)
 Subjective:  Patient ID: Becky Lawson, female    DOB: 22-Jan-1951  Age: 73 y.o. MRN: [id number]  CC:  Chief Complaint  Patient presents with   Hypertension    3 mo follow up. Does not have any questions or concerns.     HPI Becky Lawson presents for   Hypertension: With CAD, recent visit with cardiology noted from December 8.  Underwent CABG x 2 on September 12.  Postop A-fib, treated with amiodarone .  Treated with Plavix  given recent stenting, amiodarone , aspirin , Lipitor , Plavix  and metoprolol .  Was doing well at her follow-up visit December 8 with cardiology.  Attending cardiac rehab, and Silver sneakers, 5 days a week exercise without any further symptoms.  Blood pressure was creeping up since bypass.  Amlodipine  5 mg was added for improved BP control. Jaw pain discussed with cardiology last visit, plan to recheck in 1 month. No pain now. Off amiodarone  - considering restart, but no palpitations.   On Synthroid  100 mcg daily for hypothyroidism.  Followed by psychiatry, on trazodone , Seroquel , Lamictal , hydroxyzine , alprazolam  as needed.  On vitamin D  50,000 units weekly for vitamin D  deficiency. Synthroid  daily.   Home BP  readings past few days at rehab:  Blood pressure measurements resting 142/72, during rehab 128/60, recovery 124/62 today.   BP Readings from Last 3 Encounters:  08/14/24 138/64  08/12/24 (!) 148/74  07/18/24 (!) 142/70   Lab Results  Component Value Date   CREATININE 0.94 06/10/2024   Lab Results  Component Value Date   TSH 3.100 03/28/2024   Last vitamin D  Lab Results  Component Value Date   VD25OH 46.09 05/12/2023    History Patient Active Problem List   Diagnosis Date Noted   S/P CABG x 2 05/17/2024   Abnormal LFTs 05/15/2024   Post PTCA 04/03/2024   Elevated coronary artery calcium  score 12/25/2023   Diastolic dysfunction 12/25/2023   Closed fracture of fifth metatarsal bone 04/21/2023   Closed fracture of medial malleolus 04/17/2023    Lumbar radiculopathy 11/24/2022   Malignant tumor of thyroid  gland (HCC) 10/26/2022   Vaginal lesion 09/30/2022   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/05/2022   Status post total replacement of right hip 07/05/2022   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   S/P total left hip arthroplasty 08/03/2021   Abnormal liver function tests 04/19/2021   Degeneration of lumbar intervertebral disc 05/25/2020   Tremor 07/12/2018   Prediabetes 07/12/2018   Polypharmacy 07/12/2018   Pain of right hip joint 01/02/2018   Fatty liver 04/18/2017   GERD (gastroesophageal reflux disease) 10/28/2016   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   Chronic throat clearing 06/24/2016   S/P partial thyroidectomy 06/16/2016   Thyroid  nodule 02/08/2016   Hx of hepatitis C 05/19/2015   Vitamin D  deficiency 05/19/2015   Spinal stenosis of lumbar region 05/19/2015   Hyperlipidemia 05/19/2015   Bipolar disorder (HCC) 05/19/2015   Essential hypertension 09/26/2013   Undiagnosed cardiac murmurs 09/26/2013   Past Medical History:  Diagnosis Date   Anxiety    Arthritis    Benign cyst of right kidney 2014   2.7 cm right kidney cyst seen on imaging 2014 but was c/o right flank pain with new persistent proteinuria (fortunately hematuria had resolved) so repeat US  11/2016 showed right kidney still with 2.6 cm simple cyst and otherwise nml.   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, genotype 1b, followed at [facility]   Hyperlipidemia    Hypertension    Hypothyroidism    Jaundice 11/01/2012   Pill dysphagia 10/20/2021   Pruritic disorder 02/13/2013   Sleep apnea    Was diagnosed approximately 20 years ago, but does not wear CPAP patient stated I gave it back.SABRASABRAI couldn't wear that thing it was  horrible   Spinal stenosis    Thyroid  disease    Phreesia 06/15/2020   Unintentional weight loss of more than 10 pounds 07/12/2018   Past Surgical History:  Procedure Laterality Date   ABDOMINAL HYSTERECTOMY     APPENDECTOMY  Approx 1974   COLONOSCOPY W/ POLYPECTOMY     CORONARY ARTERY BYPASS GRAFT N/A 05/17/2024   Procedure: CORONARY ARTERY BYPASS GRAFTING (CABG) X 2, USING BILATERAL INTERNAL MAMMARY ARTERIES;  Surgeon: Lucas Dorise POUR, MD;  Location: [facility] OR;  Service: Open Heart Surgery;  Laterality: N/A;   CORONARY STENT INTERVENTION N/A 04/03/2024   Procedure: CORONARY STENT INTERVENTION;  Surgeon: Ladona Heinz, MD;  Location: [facility] INVASIVE CV LAB;  Service: Cardiovascular;  Laterality: N/A;   INTRAOPERATIVE TRANSESOPHAGEAL ECHOCARDIOGRAM N/A 05/17/2024   Procedure: ECHOCARDIOGRAM, TRANSESOPHAGEAL, INTRAOPERATIVE;  Surgeon: Lucas Dorise POUR, MD;  Location: [facility] OR;  Service: Open Heart Surgery;  Laterality: N/A;   JOINT REPLACEMENT Bilateral    knee   KNEE ARTHROSCOPY Bilateral    LEFT HEART CATH AND CORONARY ANGIOGRAPHY N/A 04/03/2024   Procedure: LEFT HEART CATH AND CORONARY ANGIOGRAPHY;  Surgeon: Ladona Heinz, MD;  Location: [facility] INVASIVE CV LAB;  Service: Cardiovascular;  Laterality: N/A;   PARTIAL HYSTERECTOMY     THYROID  LOBECTOMY Left 06/16/2016   THYROID  LOBECTOMY Left 06/16/2016   Procedure: LEFT THYROID  LOBECTOMY;  Surgeon: Camellia Blush, MD;  Location: [facility] OR;  Service: General;  Laterality: Left;   TOTAL HIP ARTHROPLASTY Left 08/03/2021   Procedure: TOTAL HIP ARTHROPLASTY ANTERIOR APPROACH;  Surgeon: Ernie Cough, MD;  Location: [facility] ORS;  Service: Orthopedics;  Laterality: Left;   TOTAL HIP ARTHROPLASTY Right 07/05/2022   Procedure: TOTAL HIP ARTHROPLASTY ANTERIOR APPROACH;  Surgeon: Ernie Cough, MD;  Location: [facility] ORS;  Service: Orthopedics;  Laterality: Right;   TUBAL LIGATION  1974   No Known Allergies Prior to Admission medications   Medication Sig Start Date End Date Taking?  Authorizing Provider  ALPRAZolam  (XANAX ) 1 MG tablet TAKE 1/2 TO 1 TABLET BY MOUTH twice daily AS NEEDED FOR ANXIETY 05/24/24  Yes Raguel Con RAMAN, PA-C  amLODipine  (NORVASC ) 5 MG tablet Take 1 tablet (5 mg total) by mouth daily. 08/12/24 11/10/24 Yes West, Katlyn D, NP  aspirin  EC 81 MG tablet Take 1 tablet (81 mg total) by mouth daily. Swallow whole. 10/27/23  Yes Branch, Ronal BRAVO, MD  atorvastatin  (LIPITOR ) 80 MG tablet Take 1 tablet (80 mg total) by mouth daily. 10/18/23  Yes Alvan Ronal BRAVO, MD  clopidogrel  (PLAVIX ) 75 MG tablet Take 1 tablet (75 mg total) by mouth daily with breakfast. 04/23/24  Yes West, Katlyn D, NP  docusate sodium  (COLACE) 100 MG capsule Take 1 capsule (100 mg total) by mouth 2 (two) times daily. 08/04/21  Yes Patti Rosina SAUNDERS, PA-C  ezetimibe  (ZETIA ) 10 MG tablet Take 1 tablet (10 mg total) by mouth daily. 04/23/24  Yes West, Katlyn D, NP  famotidine  (PEPCID ) 20 MG tablet Take 1  tablet (20 mg total) by mouth daily. 06/12/24  Yes Rutha Manuelita HERO, PA-C  lamoTRIgine  (LAMICTAL ) 100 MG tablet TAKE TWO TABLETS BY MOUTH EVERY MORNING and TAKE ONE TABLET BY MOUTH EVERY EVENING 08/06/24  Yes Teresa Rogue A, NP  levothyroxine  (SYNTHROID ) 100 MCG tablet TAKE 1 TABLET BY MOUTH DAILY BEFORE BREAKFAST 12/19/23  Yes Levora Becky SAUNDERS, MD  metoprolol  tartrate (LOPRESSOR ) 25 MG tablet Take 1 tablet (25 mg total) by mouth 2 (two) times daily. 06/10/24  Yes West, Katlyn D, NP  oxyCODONE -acetaminophen  (PERCOCET) 10-325 MG tablet Take 1 tablet by mouth every 6 (six) hours as needed for pain. 05/24/24  Yes Raguel Con RAMAN, PA-C  polyethylene glycol (MIRALAX  / GLYCOLAX ) 17 g packet Take 17 g by mouth daily as needed for mild constipation. 08/04/21  Yes Patti Rosina SAUNDERS, PA-C  QUEtiapine  (SEROQUEL ) 50 MG tablet Take 1 tablet (50 mg total) by mouth daily. 08/06/24  Yes Teresa Rogue LABOR, NP  traZODone  (DESYREL ) 150 MG tablet TAKE TWO TABLETS BY MOUTH everyday AT bedtime 08/06/24  Yes White, Brian A, NP   Vitamin D , Ergocalciferol , (DRISDOL ) 1.25 MG (50000 UNIT) CAPS capsule Take 1 capsule (50,000 Units total) by mouth every 7 (seven) days. Sunday Patient taking differently: Take 50,000 Units by mouth every Sunday. 05/10/23  Yes Levora Becky SAUNDERS, MD  Fe Fum-Vit C-Vit B12-FA (TRIGELS-F FORTE) CAPS capsule Take 1 capsule by mouth daily after breakfast. Patient not taking: Reported on 08/14/2024 05/24/24   Raguel Con RAMAN, PA-C  losartan  (COZAAR ) 25 MG tablet Take 1 tablet (25 mg total) by mouth daily. Patient not taking: Reported on 08/14/2024 06/10/24 09/08/24  Devora Rosabel BIRCH, NP   Social History   Socioeconomic History   Marital status: Divorced    Spouse name: Not on file   Number of children: Not on file   Years of education: Not on file   Highest education level: Associate degree: occupational, scientist, product/process development, or vocational program  Occupational History   Occupation: Personal Caregiver  Tobacco Use   Smoking status: Former    Current packs/day: 0.00    Average packs/day: 1 pack/day for 15.0 years (15.0 ttl pk-yrs)    Types: Cigarettes    Start date: 06/29/1964    Quit date: 06/30/1979    Years since quitting: 45.1   Smokeless tobacco: Never  Vaping Use   Vaping status: Never Used  Substance and Sexual Activity   Alcohol use: Not Currently    Alcohol/week: 0.0 - 1.0 standard drinks of alcohol   Drug use: Never   Sexual activity: Not Currently    Birth control/protection: None  Other Topics Concern   Not on file  Social History Narrative   Divorced. Education: [employer]. Exercise: No.   Left handed   Lives alone   Caffeine: 1/4-1/2 cup/day   Social Drivers of Corporate Investment Banker Strain: Low Risk  (08/10/2024)   Overall Financial Resource Strain (CARDIA)    Difficulty of Paying Living Expenses: Not hard at all  Food Insecurity: No Food Insecurity (08/10/2024)   Hunger Vital Sign    Worried About Running Out of Food in the Last Year: Never true    Ran Out of Food in the  Last Year: Never true  Transportation Needs: No Transportation Needs (08/10/2024)   PRAPARE - Administrator, Civil Service (Medical): No    Lack of Transportation (Non-Medical): No  Physical Activity: Sufficiently Active (08/10/2024)   Exercise Vital Sign    Days of Exercise per  Week: 5 days    Minutes of Exercise per Session: 60 min  Stress: No Stress Concern Present (08/10/2024)   [employer] of Occupational Health - Occupational Stress Questionnaire    Feeling of Stress: Not at all  Social Connections: Moderately Integrated (08/10/2024)   Social Connection and Isolation Panel    Frequency of Communication with Friends and Family: More than three times a week    Frequency of Social Gatherings with Friends and Family: More than three times a week    Attends Religious Services: More than 4 times per year    Active Member of [employer] or Organizations: Yes    Attends Banker Meetings: Not on file    Marital Status: Divorced  Intimate Partner Violence: Not At Risk (05/20/2024)   Humiliation, Afraid, Rape, and Kick questionnaire    Fear of Current or Ex-Partner: No    Emotionally Abused: No    Physically Abused: No    Sexually Abused: No    Review of Systems  Constitutional:  Negative for fatigue and unexpected weight change.  Respiratory:  Negative for chest tightness and shortness of breath.   Cardiovascular:  Negative for chest pain, palpitations and leg swelling.  Gastrointestinal:  Negative for abdominal pain and blood in stool.  Neurological:  Negative for dizziness, syncope, light-headedness and headaches.     Objective:   Vitals:   08/14/24 1509  BP: 138/64  Pulse: 68  Resp: 15  Temp: 98.3 F (36.8 C)  TempSrc: Temporal  SpO2: 97%  Weight: 181 lb 6.4 oz (82.3 kg)  Height: 5' 4 (1.626 m)     Physical Exam Vitals reviewed.  Constitutional:      Appearance: Normal appearance. She is well-developed.  HENT:     Head: Normocephalic and  atraumatic.  Eyes:     Conjunctiva/sclera: Conjunctivae normal.     Pupils: Pupils are equal, round, and reactive to light.  Neck:     Vascular: No carotid bruit.  Cardiovascular:     Rate and Rhythm: Normal rate and regular rhythm.     Heart sounds: Normal heart sounds.  Pulmonary:     Effort: Pulmonary effort is normal.     Breath sounds: Normal breath sounds.  Abdominal:     Palpations: Abdomen is soft. There is no pulsatile mass.     Tenderness: There is no abdominal tenderness.  Musculoskeletal:     Right lower leg: No edema.     Left lower leg: No edema.  Skin:    General: Skin is warm and dry.  Neurological:     Mental Status: She is alert and oriented to person, place, and time.  Psychiatric:        Mood and Affect: Mood normal.        Behavior: Behavior normal.        Assessment & Plan:  Becky Lawson is a 73 y.o. female . Low serum vitamin D  - Plan: Vitamin D  (25 hydroxy)  - On prescription dose supplementation as above, check labs and adjust plan accordingly.  Post-surgical hypothyroidism - Plan: TSH  - Tolerating current med regimen, check TSH and adjust replacement accordingly.  Essential hypertension Coronary artery disease involving native heart, unspecified vessel or lesion type, unspecified whether angina present  - She is doing well status post CABG in September.  Plan in place from cardiology if return of palpitations, but asymptomatic at present.  No new jaw pain, chest pain recently.  Continue to monitor with follow-up with  cardiology, cardiac rehab as planned.  Blood pressure reviewed including at rehab and will hold on meds at this time.  Improving with amlodipine  addition.  No orders of the defined types were placed in this encounter.  Patient Instructions  Glad you are doing well.  I will check your vitamin D  and thyroid  test but no other labs needed today.  Blood pressure looks like it is heading in the right direction with the new dose of  amlodipine .  No med changes from my standpoint today.  Keep follow-up with cardiology as planned and continue cardiac rehab as planned.  Take care!    Signed,   Becky Pines, MD [facility], [facility] 08/14/24 4:04 PM

## 2024-08-15 LAB — TSH: TSH: 1.53 u[IU]/mL (ref 0.35–5.50)

## 2024-08-15 LAB — VITAMIN D 25 HYDROXY (VIT D DEFICIENCY, FRACTURES): VITD: 31.42 ng/mL (ref 30.00–100.00)

## 2024-08-16 ENCOUNTER — Encounter (HOSPITAL_COMMUNITY)
Admission: RE | Admit: 2024-08-16 | Discharge: 2024-08-16 | Disposition: A | Source: Ambulatory Visit | Attending: Cardiology

## 2024-08-16 DIAGNOSIS — Z951 Presence of aortocoronary bypass graft: Secondary | ICD-10-CM

## 2024-08-17 ENCOUNTER — Ambulatory Visit: Payer: Self-pay | Admitting: Family Medicine

## 2024-08-19 ENCOUNTER — Encounter (HOSPITAL_COMMUNITY): Admission: RE | Admit: 2024-08-19

## 2024-08-19 ENCOUNTER — Telehealth (HOSPITAL_COMMUNITY): Payer: Self-pay

## 2024-08-19 NOTE — Telephone Encounter (Signed)
 Patient c/o for 12:30 CR class, states she is having car trouble.

## 2024-08-20 ENCOUNTER — Other Ambulatory Visit: Payer: Self-pay | Admitting: Family Medicine

## 2024-08-20 DIAGNOSIS — R7989 Other specified abnormal findings of blood chemistry: Secondary | ICD-10-CM

## 2024-08-20 NOTE — Telephone Encounter (Signed)
 Requested Prescriptions   Pending Prescriptions Disp Refills   Vitamin D , Ergocalciferol , (DRISDOL ) 1.25 MG (50000 UNIT) CAPS capsule [Pharmacy Med Name: ergocalciferol  (vitamin D2) 1,250 mcg (50,000 unit) capsule] 12 capsule 3    Sig: TAKE 1 CAPSULE BY MOUTH ONCE WEEKLY ON SUNDAY     Date of patient request: 08/20/24 Last office visit: 08/14/2024 Upcoming visit: 02/12/2025 Date of last refill: 05/10/23 Last refill amount: 84 days with 3 refills

## 2024-08-21 ENCOUNTER — Encounter (HOSPITAL_COMMUNITY)

## 2024-08-23 ENCOUNTER — Encounter (HOSPITAL_COMMUNITY): Admission: RE | Admit: 2024-08-23 | Discharge: 2024-08-23 | Attending: Cardiology

## 2024-08-23 ENCOUNTER — Other Ambulatory Visit: Payer: Self-pay

## 2024-08-23 ENCOUNTER — Telehealth: Payer: Self-pay | Admitting: Cardiology

## 2024-08-23 DIAGNOSIS — Z951 Presence of aortocoronary bypass graft: Secondary | ICD-10-CM | POA: Diagnosis not present

## 2024-08-23 MED ORDER — CLOPIDOGREL BISULFATE 75 MG PO TABS: 75.0000 mg | ORAL_TABLET | Freq: Every day | ORAL | 3 refills | Status: AC

## 2024-08-23 NOTE — Telephone Encounter (Signed)
 Copied from CRM #[id number]. Topic: Clinical - Medication Refill >> Aug 23, 2024  3:57 PM Becky Lawson wrote: Patient needed a rx refilled that was prescribed by her cardiologist. Pt was transferred to cardiology.

## 2024-08-23 NOTE — Telephone Encounter (Signed)
" °*  STAT* If patient is at the pharmacy, call can be transferred to refill team.   1. Which medications need to be refilled? (please list name of each medication and dose if known)   clopidogrel  (PLAVIX ) 75 MG tablet   2. Would you like to learn more about the convenience, safety, & potential cost savings by using the [facility]?   3. Are you open to using the [facility] (Type [facility]. ).  4. Which pharmacy/location (including street and city if local pharmacy) is medication to be sent to?  [facility] - [place], [place] - [address]   5. Do they need a 30 day or 90 day supply?   90 day  Patient stated she only has 1 tablet.    "

## 2024-08-23 NOTE — Telephone Encounter (Signed)
Sent refill into patients pharmacy.  

## 2024-08-26 ENCOUNTER — Telehealth (HOSPITAL_COMMUNITY): Payer: Self-pay

## 2024-08-26 ENCOUNTER — Encounter (HOSPITAL_COMMUNITY)

## 2024-08-26 NOTE — Telephone Encounter (Signed)
 Patient c/o for 12:30 CR class, states she has a dr appt.

## 2024-08-27 ENCOUNTER — Encounter: Payer: Self-pay | Admitting: Pharmacist

## 2024-08-27 NOTE — Progress Notes (Signed)
 Pharmacy Quality Measure Review  This patient is appearing on a report for being at risk of failing the adherence measure for cholesterol (statin) and hypertension (ACEi/ARB) medications this calendar year.   Medication: atorvastatin  Last fill date: 04/30/2024 for 90 day supply  Medication: losartan  Last fill date: 06/10/2024 for 90 day supply  Reviewed recent refill history in Dr Annemarie database. Actual last refill date was 08/20/2024 for 90 day supply with date sold 08/22/2024. Next appointment with PCP is 02/12/2025.    Insurance report was not up to date. No action needed at this time.   Madelin Ray, PharmD Clinical Pharmacist [facility]  Population Health [phone]

## 2024-08-28 ENCOUNTER — Encounter (HOSPITAL_COMMUNITY)
Admission: RE | Admit: 2024-08-28 | Discharge: 2024-08-28 | Disposition: A | Source: Ambulatory Visit | Attending: Cardiology

## 2024-08-28 DIAGNOSIS — Z951 Presence of aortocoronary bypass graft: Secondary | ICD-10-CM | POA: Diagnosis not present

## 2024-08-30 ENCOUNTER — Encounter (HOSPITAL_COMMUNITY)

## 2024-08-31 ENCOUNTER — Other Ambulatory Visit: Payer: Self-pay | Admitting: Family Medicine

## 2024-09-02 ENCOUNTER — Encounter (HOSPITAL_COMMUNITY)
Admission: RE | Admit: 2024-09-02 | Discharge: 2024-09-02 | Disposition: A | Source: Ambulatory Visit | Attending: Cardiology

## 2024-09-02 DIAGNOSIS — Z951 Presence of aortocoronary bypass graft: Secondary | ICD-10-CM | POA: Diagnosis not present

## 2024-09-02 NOTE — Telephone Encounter (Signed)
 Okay to refill under your name?

## 2024-09-03 NOTE — Progress Notes (Signed)
 Cardiac Individual Treatment Plan  Patient Details  Name: Becky Lawson MRN: [id number] Date of Birth: [birth year] Referring Provider:   Flowsheet Row INTENSIVE CARDIAC REHAB ORIENT from 07/18/2024 in [facility]  Referring Provider Gordy Bergamo, MD    Initial Encounter Date:  Flowsheet Row INTENSIVE CARDIAC REHAB ORIENT from 07/18/2024 in [facility]  Date 07/18/24    Visit Diagnosis: 05/17/24 S/P CABG x 2  Patient's Home Medications on Admission: Current Medications[1]  Past Medical History: Past Medical History:  Diagnosis Date   Anxiety    Arthritis    Benign cyst of right kidney 2014   2.7 cm right kidney cyst seen on imaging 2014 but was c/o right flank pain with new persistent proteinuria (fortunately hematuria had resolved) so repeat US  11/2016 showed right kidney still with 2.6 cm simple cyst and otherwise nml.   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, genotype 1b, followed at [facility]   Hyperlipidemia    Hypertension    Hypothyroidism    Jaundice 11/01/2012   Pill dysphagia 10/20/2021   Pruritic disorder 02/13/2013   Sleep apnea    Was diagnosed approximately 20 years ago, but does not wear CPAP patient stated I gave it back.SABRASABRAI couldn't wear that thing it was horrible   Spinal stenosis    Thyroid  disease    Phreesia 06/15/2020   Unintentional weight loss of more than 10 pounds 07/12/2018    Tobacco Use: Tobacco Use History[2]  Labs: Review Flowsheet  More data exists      Latest Ref Rng & Units 05/12/2023 09/26/2023 04/24/2024 05/15/2024 05/17/2024  Labs for ITP Cardiac and Pulmonary Rehab  Cholestrol 100 - 199 mg/dL 850  - 869   - -  LDL (calc) 0 - 99 mg/dL 73  - 64  - -  HDL-C >60 mg/dL 49.29  - 49  - -  Trlycerides 0 - 149 mg/dL 875.9  - 89  - -  Hemoglobin A1c 4.8 - 5.6 % - 6.0  - 5.2  -  PH, Arterial 7.35 - 7.45 - - - - 7.306  7.290  7.299  7.404  7.410  7.447  7.305   PCO2 arterial 32 - 48 mmHg - - - - 46.8  45.8  45.1  35.6  36.1  32.4  38.2   Bicarbonate 20.0 - 28.0 mmol/L - - - - 23.3  22.1  22.3  22.8  22.9  23.3  22.4  19.0   TCO2 22 - 32 mmol/L - - - - 25  24  24  24  24  25  26  24  23  23  20  25    Acid-base deficit 0.0 - 2.0 mmol/L - - - - 3.0  4.0  4.0  2.0  2.0  1.0  1.0  7.0   O2 Saturation % - - - - 97  98  98  99  100  80  100  100     Details       Multiple values from one day are sorted in reverse-chronological order  Capillary Blood Glucose: Lab Results  Component Value Date   GLUCAP 105 (H) 05/24/2024   GLUCAP 112 (H) 05/24/2024   GLUCAP 106 (H) 05/21/2024   GLUCAP 113 (H) 05/21/2024   GLUCAP 117 (H) 05/21/2024     Exercise Target Goals: Exercise Program Goal: Individual exercise prescription set using results from initial 6 min walk test and THRR while considering  patients activity barriers and safety.   Exercise Prescription Goal: Initial exercise prescription builds to 30-45 minutes a day of aerobic activity, 2-3 days per week.  Home exercise guidelines will be given to patient during program as part of exercise prescription that the participant will acknowledge.  Activity Barriers & Risk Stratification:  Activity Barriers & Cardiac Risk Stratification - 07/18/24 1540       Activity Barriers & Cardiac Risk Stratification   Activity Barriers Balance Concerns;History of Falls;Joint Problems;Arthritis;Back Problems;Deconditioning;Muscular [employer] Device;Shortness of Breath    Cardiac Risk Stratification High          6 Minute Walk:  6 Minute Walk     Row Name 07/18/24 1447         6 Minute Walk   Phase Initial  Pt used go-cart for stability      Distance 1003 feet     Walk Time 6 minutes     # of Rest Breaks 0     MPH 1.9     METS 2.21     RPE 9     Perceived Dyspnea  0.5     VO2 Peak 7.73     Symptoms Yes (comment)     Comments 7/10 chronic rt low back/hip pain     Resting HR 69 bpm     Resting BP 142/70     Max Ex. HR 103 bpm     Max Ex. BP 156/84     2 Minute Post BP 130/78        Oxygen Initial Assessment:   Oxygen Re-Evaluation:   Oxygen Discharge (Final Oxygen Re-Evaluation):   Initial Exercise Prescription:  Initial Exercise Prescription - 07/18/24 1500       Date of Initial Exercise RX and Referring Provider   Date 07/18/24    Referring Provider Gordy Bergamo, MD    Expected Discharge Date 10/09/24      NuStep   Level 1    SPM 75    Minutes 15    METs 2.2      Prescription Details   Frequency (times per week) 3    Duration Progress to 30 minutes of continuous aerobic without signs/symptoms of physical distress      Intensity   THRR 40-80% of Max Heartrate 59-118    Ratings of Perceived Exertion 11-13    Perceived Dyspnea 0-4      Progression   Progression Continue progressive overload as per policy without signs/symptoms or physical distress.      Resistance Training   Training Prescription Yes    Weight 2 lbs    Reps 10-15          Perform Capillary Blood Glucose checks as needed.  Exercise Prescription Changes:   Exercise Prescription Changes     Row Name 07/22/24 1600 08/05/24 1700 08/23/24 1500         Response to Exercise   Blood Pressure (Admit) 120/72 128/72 130/74     Blood Pressure (Exercise) 124/70 152/74 140/76     Blood Pressure (Exit) 102/54 132/56 130/64     Heart Rate (Admit) 70  bpm 65 bpm 70 bpm     Heart Rate (Exercise) 81 bpm 85 bpm 121 bpm     Heart Rate (Exit) 75 bpm 68 bpm 63 bpm     Rating of Perceived Exertion (Exercise) 9 7 11      Symptoms None None None     Comments Pt's first day in the CRP2 program Reviewed METs Reviewed METs and goals      Duration Progress to 30 minutes of  aerobic without signs/symptoms of physical distress Continue with 30 min of aerobic exercise without signs/symptoms of physical distress. Progress to 30 minutes of  aerobic without signs/symptoms of physical distress     Intensity THRR unchanged THRR unchanged THRR unchanged       Progression   Progression Continue to progress workloads to maintain intensity without signs/symptoms of physical distress. Continue to progress workloads to maintain intensity without signs/symptoms of physical distress. Continue to progress workloads to maintain intensity without signs/symptoms of physical distress.     Average METs 1.7 2.2 2       Resistance Training   Training Prescription Yes Yes --     Weight 2 lbs 2 lbs 2 lbs     Reps 10-15 10-15 10-15     Time 5 Minutes 5 Minutes 5 Minutes       Interval Training   Interval Training No No No       NuStep   Level 1 2 2      SPM 67 85 83     Minutes 25 30 30      METs 1.7 2.2 2        Exercise Comments:   Exercise Comments     Row Name 07/22/24 1634 08/05/24 1729 08/23/24 1529       Exercise Comments Pt's first day in the program. Tolerated 25 minutes on nustep. Pt did some weight exercises and stretches due to her chronic low back problems. Reviewed METs. Pt is making good progress. Has increased her duration to 30 minutes as well as her METs Reviewed METs and goals. Pt is inconsistant in her METs. Reviewed METs concept again and encouraged more consistancy.        Exercise Goals and Review:   Exercise Goals     Row Name 07/18/24 1541             Exercise Goals   Increase Physical Activity Yes       Intervention Provide advice, education, support and counseling about physical activity/exercise needs.;Develop an individualized exercise prescription for aerobic and resistive training based on initial evaluation findings, risk stratification, comorbidities and participant's personal goals.       Expected  Outcomes Short Term: Attend rehab on a regular basis to increase amount of physical activity.;Long Term: Add in home exercise to make exercise part of routine and to increase amount of physical activity.;Long Term: Exercising regularly at least 3-5 days a week.       Increase Strength and Stamina Yes       Intervention Provide advice, education, support and counseling about physical activity/exercise needs.;Develop an individualized exercise prescription for aerobic and resistive training based on initial evaluation findings, risk stratification, comorbidities and participant's personal goals.       Expected Outcomes Short Term: Increase workloads from initial exercise prescription for resistance, speed, and METs.;Short Term: Perform resistance training exercises routinely during rehab and add in resistance training at home;Long Term: Improve cardiorespiratory fitness, muscular endurance and strength as measured by increased METs and functional  capacity ( )       Able to understand and use rate of perceived exertion (RPE) scale Yes       Intervention Provide education and explanation on how to use RPE scale       Expected Outcomes Short Term: Able to use RPE daily in rehab to express subjective intensity level;Long Term:  Able to use RPE to guide intensity level when exercising independently       Knowledge and understanding of Target Heart Rate Range (THRR) Yes       Intervention Provide education and explanation of THRR including how the numbers were predicted and where they are located for reference       Expected Outcomes Short Term: Able to state/look up THRR;Long Term: Able to use THRR to govern intensity when exercising independently;Short Term: Able to use daily as guideline for intensity in rehab       Understanding of Exercise Prescription Yes       Intervention Provide education, explanation, and written materials on patient's individual exercise prescription       Expected Outcomes Short  Term: Able to explain program exercise prescription;Long Term: Able to explain home exercise prescription to exercise independently          Exercise Goals Re-Evaluation :  Exercise Goals Re-Evaluation     Row Name 07/22/24 1633 08/23/24 1526           Exercise Goal Re-Evaluation   Exercise Goals Review Increase Physical Activity;Increase Strength and Stamina;Able to understand and use rate of perceived exertion (RPE) scale;Knowledge and understanding of Target Heart Rate Range (THRR);Understanding of Exercise Prescription Increase Physical Activity;Increase Strength and Stamina;Able to understand and use rate of perceived exertion (RPE) scale;Knowledge and understanding of Target Heart Rate Range (THRR);Understanding of Exercise Prescription      Comments Pt's first day in the CRP2 program. Pt understands the exercise Rx, RPE scale and THRR. Reviewed METs and goals. Pt is making progess on her goals of improved strength, stamina and endurance. Pt is going to the silver sneakers program 2xweek and doing walking and doind an aerobic exericse class.      Expected Outcomes Will continue to monitor the patient and progress workloads as tolerated. Will continue to monitor the patient and progress workloads as tolerated.         Discharge Exercise Prescription (Final Exercise Prescription Changes):  Exercise Prescription Changes - 08/23/24 1500       Response to Exercise   Blood Pressure (Admit) 130/74    Blood Pressure (Exercise) 140/76    Blood Pressure (Exit) 130/64    Heart Rate (Admit) 70 bpm    Heart Rate (Exercise) 121 bpm    Heart Rate (Exit) 63 bpm    Rating of Perceived Exertion (Exercise) 11    Symptoms None    Comments Reviewed METs and goals    Duration Progress to 30 minutes of  aerobic without signs/symptoms of physical distress    Intensity THRR unchanged      Progression   Progression Continue to progress workloads to maintain intensity without signs/symptoms of  physical distress.    Average METs 2      Resistance Training   Weight 2 lbs    Reps 10-15    Time 5 Minutes      Interval Training   Interval Training No      NuStep   Level 2    SPM 83    Minutes 30    METs 2  Nutrition:  Target Goals: Understanding of nutrition guidelines, daily intake of sodium 1500mg , cholesterol 200mg , calories 30% from fat and 7% or less from saturated fats, daily to have 5 or more servings of fruits and vegetables.  Biometrics:  Pre Biometrics - 07/18/24 1345       Pre Biometrics   Waist Circumference 41.5 inches    Hip Circumference 45 inches    Waist to Hip Ratio 0.92 %    Triceps Skinfold 20 mm    % Body Fat 41.5 %    Grip Strength 11 kg    Flexibility --   not performed due to low back problems/pain   Single Leg Stand --   not performed          Nutrition Therapy Plan and Nutrition Goals:  Nutrition Therapy & Goals - 07/22/24 1337       Nutrition Therapy   Diet Heart Healthy    Drug/Food Interactions Statins/Certain Fruits      Personal Nutrition Goals   Nutrition Goal Patient to identify strategies for reducing cardiovascular risk by attending the Pritikin education and nutrition series weekly.    Personal Goal #2 Patient to improve diet quality by using the plate method as a guide for meal planning to include lean protein/plant protein, fruits, vegetables, whole grains, nonfat dairy as part of a well-balanced diet.    Comments Patient with medical history of hypertension, GERD, fatty liver, hypothyroidism, s/p partial thyroidectomy, bipolar, prediabetes, hyperlipidemia. 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 Pt endorses familiarity with heart healthy diet; understands importance of incorporating vegetables and limiting red meat. States behavior does not always align with diet guidelines. Largely  consumes poultry, though plans to incorporate more fish. Recent lipid panel on 04/24/24 indicates LDL > 55. Patient will benefit from participation in intensive cardiac rehab for nutrition education, exercise, and lifestyle modification.      Intervention Plan   Intervention Prescribe, educate and counsel regarding individualized specific dietary modifications aiming towards targeted core components such as weight, hypertension, lipid management, diabetes, heart failure and other comorbidities.;Nutrition handout(s) given to patient.   Handouts: My Plate Planner, Pritikin Eating For a Healthy Heart   Expected Outcomes Short Term Goal: Understand basic principles of dietary content, such as calories, fat, sodium, cholesterol and nutrients.;Long Term Goal: Adherence to prescribed nutrition plan.          Nutrition Assessments:  MEDIFICTS Score Key: >=70 Need to make dietary changes  40-70 Heart Healthy Diet <= 40 Therapeutic Level Cholesterol Diet   Flowsheet Row INTENSIVE CARDIAC REHAB from 07/26/2024 in [facility]  Picture Your Plate Total Score on Admission 38   Picture Your Plate Scores: <59 Unhealthy dietary pattern with much room for improvement. 41-50 Dietary pattern unlikely to meet recommendations for good health and room for improvement. 51-60 More healthful dietary pattern, with some room for improvement.  >60 Healthy dietary pattern, although there may be some specific behaviors that could be improved.    Nutrition Goals Re-Evaluation:  Nutrition Goals Re-Evaluation     Row Name 07/26/24 1337 08/23/24 1548           Goals   Current Weight 181 lb 14.1 oz (82.5 kg) 184 lb 11.9 oz (83.8 kg)      Nutrition Goal Patient to identify strategies for reducing cardiovascular risk by  attending the Pritikin education and nutrition series weekly. Patient to identify strategies for reducing cardiovascular risk by attending the  Pritikin education and nutrition series weekly.      Expected Outcome Contemplative. Patient with medical history of hypertension, GERD, fatty liver, hypothyroidism, s/p partial thyroidectomy, bipolar, prediabetes, hyperlipidemia. Recent history of coronary artery bypass grafting x 2, left internal mammary artery graft to the LAD and right internal mammary artery graft to the RCA on 05/17/2024.Pt reports struggling to make dietary changes due to lack of energy. Initial PYP score indicates unhealthy dietary pattern. Reports often consuming Boost oral nutrition supplements.  Current diet lacking in variety of fruits/veggies. RD provided suggestions for easy-to-prep meals such as smoothies which may include plant-based foods. Patient will benefit from participation in intensive cardiac rehab for nutrition education, exercise, and lifestyle modification. Contemplative. Patient with medical history of hypertension, GERD, fatty liver, hypothyroidism, s/p partial thyroidectomy, bipolar, prediabetes, hyperlipidemia. 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 No significant wt change since starting cardiac rehab. Pt continues to verbalize difficulty making dietary changes. Reports having guests in home and eating more calorie dense food such as enchiladas. Pt identifies need to increase fatty fish intake; plans to start incorporating salmon. RD also provided suggestions for increasing intake of plant-based foods such as vegetables. Patient will benefit from ongoing participation in intensive cardiac rehab for nutrition education, exercise, and lifestyle modification.        Personal Goal #2 Re-Evaluation   Personal Goal #2 Patient to improve diet quality by using the plate method as a guide for meal planning to include lean protein/plant protein, fruits, vegetables, whole grains, nonfat dairy as  part of a well-balanced diet. Patient to improve diet quality by using the plate method as a guide for meal planning to include lean protein/plant protein, fruits, vegetables, whole grains, nonfat dairy as part of a well-balanced diet.         Nutrition Goals Re-Evaluation:  Nutrition Goals Re-Evaluation     Row Name 07/26/24 1337 08/23/24 1548           Goals   Current Weight 181 lb 14.1 oz (82.5 kg) 184 lb 11.9 oz (83.8 kg)      Nutrition Goal Patient to identify strategies for reducing cardiovascular risk by attending the Pritikin education and nutrition series weekly. Patient to identify strategies for reducing cardiovascular risk by attending the Pritikin education and nutrition series weekly.      Expected Outcome Contemplative. Patient with medical history of hypertension, GERD, fatty liver, hypothyroidism, s/p partial thyroidectomy, bipolar, prediabetes, hyperlipidemia. Recent history of coronary artery bypass grafting x 2, left internal mammary artery graft to the LAD and right internal mammary artery graft to the RCA on 05/17/2024.Pt reports struggling to make dietary changes due to lack of energy. Initial PYP score indicates unhealthy dietary pattern. Reports often consuming Boost oral nutrition supplements.  Current diet lacking in variety of fruits/veggies. RD provided suggestions for easy-to-prep meals such as smoothies which may include plant-based foods. Patient will benefit from participation in intensive cardiac rehab for nutrition education, exercise, and lifestyle modification. Contemplative. Patient with medical history of hypertension, GERD, fatty liver, hypothyroidism, s/p partial thyroidectomy, bipolar, prediabetes, hyperlipidemia. 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 No significant wt change since  starting cardiac rehab. Pt continues to verbalize  difficulty making dietary changes. Reports having guests in home and eating more calorie dense food such as enchiladas. Pt identifies need to increase fatty fish intake; plans to start incorporating salmon. RD also provided suggestions for increasing intake of plant-based foods such as vegetables. Patient will benefit from ongoing participation in intensive cardiac rehab for nutrition education, exercise, and lifestyle modification.        Personal Goal #2 Re-Evaluation   Personal Goal #2 Patient to improve diet quality by using the plate method as a guide for meal planning to include lean protein/plant protein, fruits, vegetables, whole grains, nonfat dairy as part of a well-balanced diet. Patient to improve diet quality by using the plate method as a guide for meal planning to include lean protein/plant protein, fruits, vegetables, whole grains, nonfat dairy as part of a well-balanced diet.         Nutrition Goals Discharge (Final Nutrition Goals Re-Evaluation):  Nutrition Goals Re-Evaluation - 08/23/24 1548       Goals   Current Weight 184 lb 11.9 oz (83.8 kg)    Nutrition Goal Patient to identify strategies for reducing cardiovascular risk by attending the Pritikin education and nutrition series weekly.    Expected Outcome Contemplative. Patient with medical history of hypertension, GERD, fatty liver, hypothyroidism, s/p partial thyroidectomy, bipolar, prediabetes, hyperlipidemia. 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 No significant wt change since starting cardiac rehab. Pt continues to verbalize difficulty making dietary changes. Reports having guests in home and eating more calorie dense food such as enchiladas. Pt identifies need to increase fatty fish intake; plans to start incorporating salmon. RD also provided suggestions for increasing intake of plant-based foods  such as vegetables. Patient will benefit from ongoing participation in intensive cardiac rehab for nutrition education, exercise, and lifestyle modification.      Personal Goal #2 Re-Evaluation   Personal Goal #2 Patient to improve diet quality by using the plate method as a guide for meal planning to include lean protein/plant protein, fruits, vegetables, whole grains, nonfat dairy as part of a well-balanced diet.          Psychosocial: Target Goals: Acknowledge presence or absence of significant depression and/or stress, maximize coping skills, provide positive support system. Participant is able to verbalize types and ability to use techniques and skills needed for reducing stress and depression.  Initial Review & Psychosocial Screening:  Initial Psych Review & Screening - 07/18/24 1502       Initial Review   Current issues with Current Stress Concerns;Current Psychotropic Meds;Current Anxiety/Panic;History of Depression    Comments Ilyssa has a history of Bipolar disorder. Blimie says that she is having anxiety about a stray cat that is living outside at her home. Chrisa says that she has received counsiling in the past and is not interested in receiving any counselling currently.  Aleighna says the medications that she is taking are working for her.      Family Dynamics   Good Support System? Yes   Romonia lives alone. Quinnie has her daughter and friends for support.     Barriers   Psychosocial barriers to participate in program The patient should benefit from training in stress management and relaxation.      Screening Interventions   Interventions Encouraged to exercise;Provide feedback about the scores to participant    Expected  Outcomes Long Term Goal: Stressors or current issues are controlled or eliminated.;Short Term goal: Identification and review with participant of any Quality of Life or Depression concerns found by scoring the questionnaire.;Long Term goal: The participant improves  quality of Life and PHQ9 Scores as seen by post scores and/or verbalization of changes          Quality of Life Scores:  Quality of Life - 07/18/24 1532       Quality of Life   Select Quality of Life      Quality of Life Scores   Health/Function Pre 22.36 %    Socioeconomic Pre 25 %    Psych/Spiritual Pre 26.57 %    Family Pre 27 %    GLOBAL Pre 24.42 %         Scores of 19 and below usually indicate a poorer quality of life in these areas.  A difference of  2-3 points is a clinically meaningful difference.  A difference of 2-3 points in the total score of the Quality of Life Index has been associated with significant improvement in overall quality of life, self-image, physical symptoms, and general health in studies assessing change in quality of life.  PHQ-9: Review Flowsheet  More data exists      08/14/2024 07/18/2024 03/19/2024 09/26/2023 06/26/2023  Depression screen PHQ 2/9  Decreased Interest 0 0 2 1 2   Down, Depressed, Hopeless 0 0 2 2 2   PHQ - 2 Score 0 0 4 3 4   Altered sleeping 3 0 3 1 2   Tired, decreased energy 3 3 3 3 2   Change in appetite 3 2 0 2 0  Feeling bad or failure about yourself  0 1 0 1 0  Trouble concentrating 0 0 0 0 0  Moving slowly or fidgety/restless 0 0 0 0 0  Suicidal thoughts 0 0 0 - 0  PHQ-9 Score 9 6 10  10  8    Difficult doing work/chores Very difficult Somewhat difficult Very difficult Very difficult Very difficult    Details       Data saved with a previous flowsheet row definition        Interpretation of Total Score  Total Score Depression Severity:  1-4 = Minimal depression, 5-9 = Mild depression, 10-14 = Moderate depression, 15-19 = Moderately severe depression, 20-27 = Severe depression   Psychosocial Evaluation and Intervention:   Psychosocial Re-Evaluation:  Psychosocial Re-Evaluation     Row Name 08/06/24 1130 09/03/24 1004           Psychosocial Re-Evaluation   Current issues with Current Stress  Concerns;Current Psychotropic Meds;History of Depression;Current Anxiety/Panic Current Stress Concerns;Current Psychotropic Meds;History of Depression;Current Anxiety/Panic      Comments Shaden has not voiced any increased psychosocial concerns or stressors during exercise at cardiac rehab. Taheerah has not voiced any increased psychosocial concerns or stressors during exercise at cardiac rehab.      Expected Outcomes Rainn will have controlled/reduced stress and anxiety by completion of cardiac rehab. Nyima will experience controlled/reduced stress and anxiety by completion of cardiac rehab.      Interventions Stress management education;Relaxation education;Encouraged to attend Cardiac Rehabilitation for the exercise Stress management education;Relaxation education;Encouraged to attend Cardiac Rehabilitation for the exercise      Continue Psychosocial Services  Follow up required by staff Follow up required by staff         Psychosocial Discharge (Final Psychosocial Re-Evaluation):  Psychosocial Re-Evaluation - 09/03/24 1004       Psychosocial  Re-Evaluation   Current issues with Current Stress Concerns;Current Psychotropic Meds;History of Depression;Current Anxiety/Panic    Comments Jhane has not voiced any increased psychosocial concerns or stressors during exercise at cardiac rehab.    Expected Outcomes Delcenia will experience controlled/reduced stress and anxiety by completion of cardiac rehab.    Interventions Stress management education;Relaxation education;Encouraged to attend Cardiac Rehabilitation for the exercise    Continue Psychosocial Services  Follow up required by staff          Vocational Rehabilitation: Provide vocational rehab assistance to qualifying candidates.   Vocational Rehab Evaluation & Intervention:  Vocational Rehab - 07/18/24 1508       Initial Vocational Rehab Evaluation & Intervention   Assessment shows need for Vocational Rehabilitation No   Ranesha is retired  and does not need vocational rehab at this time         Education: Education Goals: Education classes will be provided on a weekly basis, covering required topics. Participant will state understanding/return demonstration of topics presented.    Education     Row Name 07/22/24 1400     Education   Cardiac Education Topics Pritikin   Psychologist, Forensic Exercise Education   Exercise Education Move It!   Instruction Review Code 1- Verbalizes Understanding   Class Start Time 1412   Class Stop Time 1447   Class Time Calculation (min) 35 min    Row Name 07/24/24 1300     Education   Cardiac Education Topics Pritikin   Customer Service Manager   Weekly Topic Efficiency Cooking - Meals in a Snap   Instruction Review Code 1- Verbalizes Understanding   Class Start Time 1400   Class Stop Time 1444   Class Time Calculation (min) 44 min    Row Name 07/26/24 1200     Education   Cardiac Education Topics Pritikin   Geographical Information Systems Officer Exercise   Exercise Workshop Exercise Basics: Diplomatic Services Operational Officer   Instruction Review Code 1- Verbalizes Understanding   Class Start Time 1400   Class Stop Time 1452   Class Time Calculation (min) 52 min    Row Name 07/29/24 1300     Education   Cardiac Education Topics Pritikin   Glass Blower/designer Nutrition   Nutrition Workshop Targeting Your Nutrition Priorities   Instruction Review Code 1- Verbalizes Understanding   Class Start Time 1400   Class Stop Time 1445   Class Time Calculation (min) 45 min    Row Name 07/31/24 1300     Education   Cardiac Education Topics Pritikin   Customer Service Manager   Weekly Topic One-Pot Wonders   Instruction Review Code 1- Verbalizes Understanding    Class Start Time 1400   Class Stop Time 1445   Class Time Calculation (min) 45 min    Row Name 08/05/24 1200     Education   Cardiac Education Topics Pritikin   Geographical Information Systems Officer Psychosocial   Psychosocial Workshop Focused Goals, Sustainable Changes   Instruction Review Code 1- Verbalizes Understanding   Class Start Time 1355   Class Stop  Time 1438   Class Time Calculation (min) 43 min    Row Name 08/09/24 1200     Education   Cardiac Education Topics Pritikin   Licensed Conveyancer Nutrition   Nutrition Dining Out - Part 1   Instruction Review Code 1- Verbalizes Understanding    Row Name 08/09/24 1400     Education   Cardiac Education Topics Pritikin   Licensed Conveyancer Nutrition   Nutrition Dining Out - Part 1   Instruction Review Code 1- Verbalizes Understanding   Class Start Time 1400   Class Stop Time 1440   Class Time Calculation (min) 40 min    Row Name 08/16/24 1300     Education   Cardiac Education Topics Pritikin   Licensed Conveyancer Nutrition   Nutrition Vitamins and Minerals   Instruction Review Code 1- Verbalizes Understanding    Row Name 08/23/24 1300     Education   Cardiac Education Topics Pritikin   Glass Blower/designer Nutrition   Nutrition Workshop Fueling a Forensic Psychologist   Instruction Review Code 1- Tax Inspector   Class Start Time 1400   Class Stop Time 1455   Class Time Calculation (min) 55 min    Row Name 08/28/24 1200     Education   Cardiac Education Topics --   Select --     Hospital Doctor --   Weekly Topic --   Instruction Review Code --    Row Name 09/02/24 1500     Education   Cardiac Education Topics Pritikin   Architect Exercise Physiologist   Select Psychosocial   Psychosocial How Our Thoughts Can Heal Our Hearts   Instruction Review Code 1- Verbalizes Understanding   Class Start Time 1400   Class Stop Time 1450   Class Time Calculation (min) 50 min      Core Videos: Exercise    Move It!  Clinical staff conducted group or individual video education with verbal and written material and guidebook.  Patient learns the recommended Pritikin exercise program. Exercise with the goal of living a long, healthy life. Some of the health benefits of exercise include controlled diabetes, healthier blood pressure levels, improved cholesterol levels, improved heart and lung capacity, improved sleep, and better body composition. Everyone should speak with their doctor before starting or changing an exercise routine.  Biomechanical Limitations Clinical staff conducted group or individual video education with verbal and written material and guidebook.  Patient learns how biomechanical limitations can impact exercise and how we can mitigate and possibly overcome limitations to have an impactful and balanced exercise routine.  Body Composition Clinical staff conducted group or individual video education with verbal and written material and guidebook.  Patient learns that body composition (ratio of muscle mass to fat mass) is a key component to assessing overall fitness, rather than body weight alone. Increased fat mass, especially visceral belly fat, can put us  at increased risk for metabolic syndrome, type 2 diabetes, heart disease, and even death. It is recommended to combine diet and exercise (cardiovascular and resistance training) to improve your body composition. Seek guidance from your physician  and exercise physiologist before implementing an exercise routine.  Exercise Action Plan Clinical staff conducted group or individual video education with verbal and written material and guidebook.  Patient learns the  recommended strategies to achieve and enjoy long-term exercise adherence, including variety, self-motivation, self-efficacy, and positive decision making. Benefits of exercise include fitness, good health, weight management, more energy, better sleep, less stress, and overall well-being.  Medical   Heart Disease Risk Reduction Clinical staff conducted group or individual video education with verbal and written material and guidebook.  Patient learns our heart is our most vital organ as it circulates oxygen, nutrients, white blood cells, and hormones throughout the entire body, and carries waste away. Data supports a plant-based eating plan like the Pritikin Program for its effectiveness in slowing progression of and reversing heart disease. The video provides a number of recommendations to address heart disease.   Metabolic Syndrome and Belly Fat  Clinical staff conducted group or individual video education with verbal and written material and guidebook.  Patient learns what metabolic syndrome is, how it leads to heart disease, and how one can reverse it and keep it from coming back. You have metabolic syndrome if you have 3 of the following 5 criteria: abdominal obesity, high blood pressure, high triglycerides, low HDL cholesterol, and high blood sugar.  Hypertension and Heart Disease Clinical staff conducted group or individual video education with verbal and written material and guidebook.  Patient learns that high blood pressure, or hypertension, is very common in the [place] . Hypertension is largely due to excessive salt intake, but other important risk factors include being overweight, physical inactivity, drinking too much alcohol, smoking, and not eating enough potassium from fruits and vegetables. High blood pressure is a leading risk factor for heart attack, stroke, congestive heart failure, dementia, kidney failure, and premature death. Long-term effects of excessive salt intake include  stiffening of the arteries and thickening of heart muscle and organ damage. Recommendations include ways to reduce hypertension and the risk of heart disease.  Diseases of Our Time - Focusing on Diabetes Clinical staff conducted group or individual video education with verbal and written material and guidebook.  Patient learns why the best way to stop diseases of our time is prevention, through food and other lifestyle changes. Medicine (such as prescription pills and surgeries) is often only a Band-Aid on the problem, not a long-term solution. Most common diseases of our time include obesity, type 2 diabetes, hypertension, heart disease, and cancer. The Pritikin Program is recommended and has been proven to help reduce, reverse, and/or prevent the damaging effects of metabolic syndrome.  Nutrition   Overview of the Pritikin Eating Plan  Clinical staff conducted group or individual video education with verbal and written material and guidebook.  Patient learns about the Pritikin Eating Plan for disease risk reduction. The Pritikin Eating Plan emphasizes a wide variety of unrefined, minimally-processed carbohydrates, like fruits, vegetables, whole grains, and legumes. Go, Caution, and Stop food choices are explained. Plant-based and lean animal proteins are emphasized. Rationale provided for low sodium intake for blood pressure control, low added sugars for blood sugar stabilization, and low added fats and oils for coronary artery disease risk reduction and weight management.  Calorie Density  Clinical staff conducted group or individual video education with verbal and written material and guidebook.  Patient learns about calorie density and how it impacts the Pritikin Eating Plan. Knowing the characteristics of the food you choose will help you decide whether those foods will  lead to weight gain or weight loss, and whether you want to consume more or less of them. Weight loss is usually a side effect of  the Pritikin Eating Plan because of its focus on low calorie-dense foods.  Label Reading  Clinical staff conducted group or individual video education with verbal and written material and guidebook.  Patient learns about the Pritikin recommended label reading guidelines and corresponding recommendations regarding calorie density, added sugars, sodium content, and whole grains.  Dining Out - Part 1  Clinical staff conducted group or individual video education with verbal and written material and guidebook.  Patient learns that restaurant meals can be sabotaging because they can be so high in calories, fat, sodium, and/or sugar. Patient learns recommended strategies on how to positively address this and avoid unhealthy pitfalls.  Facts on Fats  Clinical staff conducted group or individual video education with verbal and written material and guidebook.  Patient learns that lifestyle modifications can be just as effective, if not more so, as many medications for lowering your risk of heart disease. A Pritikin lifestyle can help to reduce your risk of inflammation and atherosclerosis (cholesterol build-up, or plaque, in the artery walls). Lifestyle interventions such as dietary choices and physical activity address the cause of atherosclerosis. A review of the types of fats and their impact on blood cholesterol levels, along with dietary recommendations to reduce fat intake is also included.  Nutrition Action Plan  Clinical staff conducted group or individual video education with verbal and written material and guidebook.  Patient learns how to incorporate Pritikin recommendations into their lifestyle. Recommendations include planning and keeping personal health goals in mind as an important part of their success.  Healthy Mind-Set    Healthy Minds, Bodies, Hearts  Clinical staff conducted group or individual video education with verbal and written material and guidebook.  Patient learns how to  identify when they are stressed. Video will discuss the impact of that stress, as well as the many benefits of stress management. Patient will also be introduced to stress management techniques. The way we think, act, and feel has an impact on our hearts.  How Our Thoughts Can Heal Our Hearts  Clinical staff conducted group or individual video education with verbal and written material and guidebook.  Patient learns that negative thoughts can cause depression and anxiety. This can result in negative lifestyle behavior and serious health problems. Cognitive behavioral therapy is an effective method to help control our thoughts in order to change and improve our emotional outlook.  Additional Videos:  Exercise    Improving Performance  Clinical staff conducted group or individual video education with verbal and written material and guidebook.  Patient learns to use a non-linear approach by alternating intensity levels and lengths of time spent exercising to help burn more calories and lose more body fat. Cardiovascular exercise helps improve heart health, metabolism, hormonal balance, blood sugar control, and recovery from fatigue. Resistance training improves strength, endurance, balance, coordination, reaction time, metabolism, and muscle mass. Flexibility exercise improves circulation, posture, and balance. Seek guidance from your physician and exercise physiologist before implementing an exercise routine and learn your capabilities and proper form for all exercise.  Introduction to Yoga  Clinical staff conducted group or individual video education with verbal and written material and guidebook.  Patient learns about yoga, a discipline of the coming together of mind, breath, and body. The benefits of yoga include improved flexibility, improved range of motion, better posture and core strength, increased  lung function, weight loss, and positive self-image. Yogas heart health benefits include lowered  blood pressure, healthier heart rate, decreased cholesterol and triglyceride levels, improved immune function, and reduced stress. Seek guidance from your physician and exercise physiologist before implementing an exercise routine and learn your capabilities and proper form for all exercise.  Medical   Aging: Enhancing Your Quality of Life  Clinical staff conducted group or individual video education with verbal and written material and guidebook.  Patient learns key strategies and recommendations to stay in good physical health and enhance quality of life, such as prevention strategies, having an advocate, securing a Health Care Proxy and Power of Attorney, and keeping a list of medications and system for tracking them. It also discusses how to avoid risk for bone loss.  Biology of Weight Control  Clinical staff conducted group or individual video education with verbal and written material and guidebook.  Patient learns that weight gain occurs because we consume more calories than we burn (eating more, moving less). Even if your body weight is normal, you may have higher ratios of fat compared to muscle mass. Too much body fat puts you at increased risk for cardiovascular disease, heart attack, stroke, type 2 diabetes, and obesity-related cancers. In addition to exercise, following the Pritikin Eating Plan can help reduce your risk.  Decoding Lab Results  Clinical staff conducted group or individual video education with verbal and written material and guidebook.  Patient learns that lab test reflects one measurement whose values change over time and are influenced by many factors, including medication, stress, sleep, exercise, food, hydration, pre-existing medical conditions, and more. It is recommended to use the knowledge from this video to become more involved with your lab results and evaluate your numbers to speak with your doctor.   Diseases of Our Time - Overview  Clinical staff conducted  group or individual video education with verbal and written material and guidebook.  Patient learns that according to the CDC, 50% to 70% of chronic diseases (such as obesity, type 2 diabetes, elevated lipids, hypertension, and heart disease) are avoidable through lifestyle improvements including healthier food choices, listening to satiety cues, and increased physical activity.  Sleep Disorders Clinical staff conducted group or individual video education with verbal and written material and guidebook.  Patient learns how good quality and duration of sleep are important to overall health and well-being. Patient also learns about sleep disorders and how they impact health along with recommendations to address them, including discussing with a physician.  Nutrition  Dining Out - Part 2 Clinical staff conducted group or individual video education with verbal and written material and guidebook.  Patient learns how to plan ahead and communicate in order to maximize their dining experience in a healthy and nutritious manner. Included are recommended food choices based on the type of restaurant the patient is visiting.   Fueling a Banker conducted group or individual video education with verbal and written material and guidebook.  There is a strong connection between our food choices and our health. Diseases like obesity and type 2 diabetes are very prevalent and are in large-part due to lifestyle choices. The Pritikin Eating Plan provides plenty of food and hunger-curbing satisfaction. It is easy to follow, affordable, and helps reduce health risks.  Menu Workshop  Clinical staff conducted group or individual video education with verbal and written material and guidebook.  Patient learns that restaurant meals can sabotage health goals because they are often packed  with calories, fat, sodium, and sugar. Recommendations include strategies to plan ahead and to communicate with the  manager, chef, or server to help order a healthier meal.  Planning Your Eating Strategy  Clinical staff conducted group or individual video education with verbal and written material and guidebook.  Patient learns about the Pritikin Eating Plan and its benefit of reducing the risk of disease. The Pritikin Eating Plan does not focus on calories. Instead, it emphasizes high-quality, nutrient-rich foods. By knowing the characteristics of the foods, we choose, we can determine their calorie density and make informed decisions.  Targeting Your Nutrition Priorities  Clinical staff conducted group or individual video education with verbal and written material and guidebook.  Patient learns that lifestyle habits have a tremendous impact on disease risk and progression. This video provides eating and physical activity recommendations based on your personal health goals, such as reducing LDL cholesterol, losing weight, preventing or controlling type 2 diabetes, and reducing high blood pressure.  Vitamins and Minerals  Clinical staff conducted group or individual video education with verbal and written material and guidebook.  Patient learns different ways to obtain key vitamins and minerals, including through a recommended healthy diet. It is important to discuss all supplements you take with your doctor.   Healthy Mind-Set    Smoking Cessation  Clinical staff conducted group or individual video education with verbal and written material and guidebook.  Patient learns that cigarette smoking and tobacco addiction pose a serious health risk which affects millions of people. Stopping smoking will significantly reduce the risk of heart disease, lung disease, and many forms of cancer. Recommended strategies for quitting are covered, including working with your doctor to develop a successful plan.  Culinary   Becoming a Set Designer conducted group or individual video education with verbal and  written material and guidebook.  Patient learns that cooking at home can be healthy, cost-effective, quick, and puts them in control. Keys to cooking healthy recipes will include looking at your recipe, assessing your equipment needs, planning ahead, making it simple, choosing cost-effective seasonal ingredients, and limiting the use of added fats, salts, and sugars.  Cooking - Breakfast and Snacks  Clinical staff conducted group or individual video education with verbal and written material and guidebook.  Patient learns how important breakfast is to satiety and nutrition through the entire day. Recommendations include key foods to eat during breakfast to help stabilize blood sugar levels and to prevent overeating at meals later in the day. Planning ahead is also a key component.  Cooking - Educational Psychologist conducted group or individual video education with verbal and written material and guidebook.  Patient learns eating strategies to improve overall health, including an approach to cook more at home. Recommendations include thinking of animal protein as a side on your plate rather than center stage and focusing instead on lower calorie dense options like vegetables, fruits, whole grains, and plant-based proteins, such as beans. Making sauces in large quantities to freeze for later and leaving the skin on your vegetables are also recommended to maximize your experience.  Cooking - Healthy Salads and Dressing Clinical staff conducted group or individual video education with verbal and written material and guidebook.  Patient learns that vegetables, fruits, whole grains, and legumes are the foundations of the Pritikin Eating Plan. Recommendations include how to incorporate each of these in flavorful and healthy salads, and how to create homemade salad dressings. Proper handling of ingredients is  also covered. Cooking - Soups and [employer] - Soups and Desserts Clinical staff  conducted group or individual video education with verbal and written material and guidebook.  Patient learns that Pritikin soups and desserts make for easy, nutritious, and delicious snacks and meal components that are low in sodium, fat, sugar, and calorie density, while high in vitamins, minerals, and filling fiber. Recommendations include simple and healthy ideas for soups and desserts.   Overview     The Pritikin Solution Program Overview Clinical staff conducted group or individual video education with verbal and written material and guidebook.  Patient learns that the results of the Pritikin Program have been documented in more than 100 articles published in peer-reviewed journals, and the benefits include reducing risk factors for (and, in some cases, even reversing) high cholesterol, high blood pressure, type 2 diabetes, obesity, and more! An overview of the three key pillars of the Pritikin Program will be covered: eating well, doing regular exercise, and having a healthy mind-set.  WORKSHOPS  Exercise: Exercise Basics: Building Your Action Plan Clinical staff led group instruction and group discussion with PowerPoint presentation and patient guidebook. To enhance the learning environment the use of posters, models and videos may be added. At the conclusion of this workshop, patients will comprehend the difference between physical activity and exercise, as well as the benefits of incorporating both, into their routine. Patients will understand the FITT (Frequency, Intensity, Time, and Type) principle and how to use it to build an exercise action plan. In addition, safety concerns and other considerations for exercise and cardiac rehab will be addressed by the presenter. The purpose of this lesson is to promote a comprehensive and effective weekly exercise routine in order to improve patients overall level of fitness.   Managing Heart Disease: Your Path to a Healthier Heart Clinical  staff led group instruction and group discussion with PowerPoint presentation and patient guidebook. To enhance the learning environment the use of posters, models and videos may be added.At the conclusion of this workshop, patients will understand the anatomy and physiology of the heart. Additionally, they will understand how Pritikins three pillars impact the risk factors, the progression, and the management of heart disease.  The purpose of this lesson is to provide a high-level overview of the heart, heart disease, and how the Pritikin lifestyle positively impacts risk factors.  Exercise Biomechanics Clinical staff led group instruction and group discussion with PowerPoint presentation and patient guidebook. To enhance the learning environment the use of posters, models and videos may be added. Patients will learn how the structural parts of their bodies function and how these functions impact their daily activities, movement, and exercise. Patients will learn how to promote a neutral spine, learn how to manage pain, and identify ways to improve their physical movement in order to promote healthy living. The purpose of this lesson is to expose patients to common physical limitations that impact physical activity. Participants will learn practical ways to adapt and manage aches and pains, and to minimize their effect on regular exercise. Patients will learn how to maintain good posture while sitting, walking, and lifting.  Balance Training and Fall Prevention  Clinical staff led group instruction and group discussion with PowerPoint presentation and patient guidebook. To enhance the learning environment the use of posters, models and videos may be added. At the conclusion of this workshop, patients will understand the importance of their sensorimotor skills (vision, proprioception, and the vestibular system) in maintaining their ability to balance  as they age. Patients will apply a variety of  balancing exercises that are appropriate for their current level of function. Patients will understand the common causes for poor balance, possible solutions to these problems, and ways to modify their physical environment in order to minimize their fall risk. The purpose of this lesson is to teach patients about the importance of maintaining balance as they age and ways to minimize their risk of falling.  WORKSHOPS   Nutrition:  Fueling a Ship Broker led group instruction and group discussion with PowerPoint presentation and patient guidebook. To enhance the learning environment the use of posters, models and videos may be added. Patients will review the foundational principles of the Pritikin Eating Plan and understand what constitutes a serving size in each of the food groups. Patients will also learn Pritikin-friendly foods that are better choices when away from home and review make-ahead meal and snack options. Calorie density will be reviewed and applied to three nutrition priorities: weight maintenance, weight loss, and weight gain. The purpose of this lesson is to reinforce (in a group setting) the key concepts around what patients are recommended to eat and how to apply these guidelines when away from home by planning and selecting Pritikin-friendly options. Patients will understand how calorie density may be adjusted for different weight management goals.  Mindful Eating  Clinical staff led group instruction and group discussion with PowerPoint presentation and patient guidebook. To enhance the learning environment the use of posters, models and videos may be added. Patients will briefly review the concepts of the Pritikin Eating Plan and the importance of low-calorie dense foods. The concept of mindful eating will be introduced as well as the importance of paying attention to internal hunger signals. Triggers for non-hunger eating and techniques for dealing with triggers will be  explored. The purpose of this lesson is to provide patients with the opportunity to review the basic principles of the Pritikin Eating Plan, discuss the value of eating mindfully and how to measure internal cues of hunger and fullness using the Hunger Scale. Patients will also discuss reasons for non-hunger eating and learn strategies to use for controlling emotional eating.  Targeting Your Nutrition Priorities Clinical staff led group instruction and group discussion with PowerPoint presentation and patient guidebook. To enhance the learning environment the use of posters, models and videos may be added. Patients will learn how to determine their genetic susceptibility to disease by reviewing their family history. Patients will gain insight into the importance of diet as part of an overall healthy lifestyle in mitigating the impact of genetics and other environmental insults. The purpose of this lesson is to provide patients with the opportunity to assess their personal nutrition priorities by looking at their family history, their own health history and current risk factors. Patients will also be able to discuss ways of prioritizing and modifying the Pritikin Eating Plan for their highest risk areas  Menu  Clinical staff led group instruction and group discussion with PowerPoint presentation and patient guidebook. To enhance the learning environment the use of posters, models and videos may be added. Using menus brought in from [employer], or printed from [employer], patients will apply the Pritikin dining out guidelines that were presented in the [employer] video. Patients will also be able to practice these guidelines in a variety of provided scenarios. The purpose of this lesson is to provide patients with the opportunity to practice hands-on learning of the [employer]  guidelines with actual menus and practice scenarios.  Label Reading Clinical staff led group  instruction and group discussion with PowerPoint presentation and patient guidebook. To enhance the learning environment the use of posters, models and videos may be added. Patients will review and discuss the Pritikin label reading guidelines presented in Pritikins Label Reading Educational series video. Using fool labels brought in from local grocery stores and markets, patients will apply the label reading guidelines and determine if the packaged food meet the Pritikin guidelines. The purpose of this lesson is to provide patients with the opportunity to review, discuss, and practice hands-on learning of the Pritikin Label Reading guidelines with actual packaged food labels. Cooking School  Pritikins [employer] are designed to teach patients ways to prepare quick, simple, and affordable recipes at home. The importance of nutritions role in chronic disease risk reduction is reflected in its emphasis in the overall Pritikin program. By learning how to prepare essential core Pritikin Eating Plan recipes, patients will increase control over what they eat; be able to customize the flavor of foods without the use of added salt, sugar, or fat; and improve the quality of the food they consume. By learning a set of core recipes which are easily assembled, quickly prepared, and affordable, patients are more likely to prepare more healthy foods at home. These workshops focus on convenient breakfasts, simple entres, side dishes, and desserts which can be prepared with minimal effort and are consistent with nutrition recommendations for cardiovascular risk reduction. Cooking [employer] are taught by a armed forces logistics/support/administrative officer (RD) who has been trained by the [employer]. The chef or RD has a clear understanding of the importance of minimizing - if not completely eliminating - added fat, sugar, and sodium in recipes. Throughout the series of Cooking School Workshop sessions, patients  will learn about healthy ingredients and efficient methods of cooking to build confidence in their capability to prepare    Cooking School weekly topics:  Adding Flavor- Sodium-Free  Fast and Healthy Breakfasts  Powerhouse Plant-Based Proteins  Satisfying Salads and Dressings  Simple Sides and Sauces  International Cuisine-Spotlight on the [employer] Zones  Delicious Desserts  Savory Soups  [employer] - Meals in a Astronomer Appetizers and Snacks  Comforting Weekend Breakfasts  One-Pot Wonders   Fast Evening Meals  Landscape Architect Your Pritikin Plate  WORKSHOPS   Healthy Mindset (Psychosocial):  Focused Goals, Sustainable Changes Clinical staff led group instruction and group discussion with PowerPoint presentation and patient guidebook. To enhance the learning environment the use of posters, models and videos may be added. Patients will be able to apply effective goal setting strategies to establish at least one personal goal, and then take consistent, meaningful action toward that goal. They will learn to identify common barriers to achieving personal goals and develop strategies to overcome them. Patients will also gain an understanding of how our mind-set can impact our ability to achieve goals and the importance of cultivating a positive and growth-oriented mind-set. The purpose of this lesson is to provide patients with a deeper understanding of how to set and achieve personal goals, as well as the tools and strategies needed to overcome common obstacles which may arise along the way.  From Head to Heart: The Power of a Healthy Outlook  Clinical staff led group instruction and group discussion with PowerPoint presentation and patient guidebook. To enhance the learning environment the use of posters, models and videos may be  added. Patients will be able to recognize and describe the impact of emotions and mood on physical health. They will discover the importance  of self-care and explore self-care practices which may work for them. Patients will also learn how to utilize the 4 Cs to cultivate a healthier outlook and better manage stress and challenges. The purpose of this lesson is to demonstrate to patients how a healthy outlook is an essential part of maintaining good health, especially as they continue their cardiac rehab journey.  Healthy Sleep for a Healthy Heart Clinical staff led group instruction and group discussion with PowerPoint presentation and patient guidebook. To enhance the learning environment the use of posters, models and videos may be added. At the conclusion of this workshop, patients will be able to demonstrate knowledge of the importance of sleep to overall health, well-being, and quality of life. They will understand the symptoms of, and treatments for, common sleep disorders. Patients will also be able to identify daytime and nighttime behaviors which impact sleep, and they will be able to apply these tools to help manage sleep-related challenges. The purpose of this lesson is to provide patients with a general overview of sleep and outline the importance of quality sleep. Patients will learn about a few of the most common sleep disorders. Patients will also be introduced to the concept of sleep hygiene, and discover ways to self-manage certain sleeping problems through simple daily behavior changes. Finally, the workshop will motivate patients by clarifying the links between quality sleep and their goals of heart-healthy living.   Recognizing and Reducing Stress Clinical staff led group instruction and group discussion with PowerPoint presentation and patient guidebook. To enhance the learning environment the use of posters, models and videos may be added. At the conclusion of this workshop, patients will be able to understand the types of stress reactions, differentiate between acute and chronic stress, and recognize the impact that  chronic stress has on their health. They will also be able to apply different coping mechanisms, such as reframing negative self-talk. Patients will have the opportunity to practice a variety of stress management techniques, such as deep abdominal breathing, progressive muscle relaxation, and/or guided imagery.  The purpose of this lesson is to educate patients on the role of stress in their lives and to provide healthy techniques for coping with it.  Learning Barriers/Preferences:  Learning Barriers/Preferences - 07/18/24 1532       Learning Barriers/Preferences   Learning Barriers Sight    Learning Preferences Audio;Computer/Internet;Group Instruction;Individual Instruction;Pictoral;Skilled Demonstration;Verbal Instruction;Video;Written Material          Education Topics:  Knowledge Questionnaire Score:  Knowledge Questionnaire Score - 07/18/24 1534       Knowledge Questionnaire Score   Pre Score 21/24          Core Components/Risk Factors/Patient Goals at Admission:  Personal Goals and Risk Factors at Admission - 07/18/24 1534       Core Components/Risk Factors/Patient Goals on Admission   Hypertension Yes    Intervention Provide education on lifestyle modifcations including regular physical activity/exercise, weight management, moderate sodium restriction and increased consumption of fresh fruit, vegetables, and low fat dairy, alcohol moderation, and smoking cessation.;Monitor prescription use compliance.    Expected Outcomes Short Term: Continued assessment and intervention until BP is < 140/76mm HG in hypertensive participants. < 130/42mm HG in hypertensive participants with diabetes, heart failure or chronic kidney disease.;Long Term: Maintenance of blood pressure at goal levels.    Lipids Yes    Intervention  Provide education and support for participant on nutrition & aerobic/resistive exercise along with prescribed medications to achieve LDL 70mg , HDL >40mg .    Expected  Outcomes Short Term: Participant states understanding of desired cholesterol values and is compliant with medications prescribed. Participant is following exercise prescription and nutrition guidelines.;Long Term: Cholesterol controlled with medications as prescribed, with individualized exercise RX and with personalized nutrition plan. Value goals: LDL < 70mg , HDL > 40 mg.    Stress Yes    Intervention Offer individual and/or small group education and counseling on adjustment to heart disease, stress management and health-related lifestyle change. Teach and support self-help strategies.;Refer participants experiencing significant psychosocial distress to appropriate mental health specialists for further evaluation and treatment. When possible, include family members and significant others in education/counseling sessions.    Expected Outcomes Short Term: Participant demonstrates changes in health-related behavior, relaxation and other stress management skills, ability to obtain effective social support, and compliance with psychotropic medications if prescribed.;Long Term: Emotional wellbeing is indicated by absence of clinically significant psychosocial distress or social isolation.          Core Components/Risk Factors/Patient Goals Review:   Goals and Risk Factor Review     Row Name 08/06/24 1133 09/03/24 1005           Core Components/Risk Factors/Patient Goals Review   Personal Goals Review Hypertension;Lipids;Stress Hypertension;Lipids;Stress      Review Louna is doing well with exercise at cardiac rehab. Vital signs stable. Lakindra has already begun to increased her MET levels in her first seven rehab sessions. Milanni is doing well with exercise at cardiac rehab. VSS. Allyce has maintained her MET levels over the last 30 days.      Expected Outcomes Jeselle will continue to particpate in cardiac rehab for exercise nutriton and lifestyle modifications. Aariona will continue to particpate in  cardiac rehab for exercise nutrition and lifestyle modifications.         Core Components/Risk Factors/Patient Goals at Discharge (Final Review):   Goals and Risk Factor Review - 09/03/24 1005       Core Components/Risk Factors/Patient Goals Review   Personal Goals Review Hypertension;Lipids;Stress    Review Finleigh is doing well with exercise at cardiac rehab. VSS. Sidrah has maintained her MET levels over the last 30 days.    Expected Outcomes Ariannah will continue to particpate in cardiac rehab for exercise nutrition and lifestyle modifications.          ITP Comments:  ITP Comments     Row Name 07/18/24 1402 07/22/24 1405 08/06/24 1025 09/03/24 1003     ITP Comments Dr Wilbert Bihari MD, Medical Director. Pritikin Education Program/ Intensive Cardiac Rehab. Initial Orientation Packet Reviewed with the patient. 30 Day ITP Review. Bea started cardiac rehab on 07/22/24 and tolerated exercising well. 30 Day ITP Review. Othello started cardiac rehab on 07/22/24, is tolerating exercise well, and is off to a good start. 30 Day ITP Review. Regenia is tolerating exercise well since she started cardiac rehab (07/22/24)       Comments: see ITP comments    [1]  Current Outpatient Medications:    ALPRAZolam  (XANAX ) 1 MG tablet, TAKE 1/2 TO 1 TABLET BY MOUTH twice daily AS NEEDED FOR ANXIETY, Disp: 30 tablet, Rfl: 0   amLODipine  (NORVASC ) 5 MG tablet, Take 1 tablet (5 mg total) by mouth daily., Disp: 90 tablet, Rfl: 3   aspirin  EC 81 MG tablet, Take 1 tablet (81 mg total) by mouth daily. Swallow whole., Disp: 30 tablet,  Rfl: 12   atorvastatin  (LIPITOR ) 80 MG tablet, Take 1 tablet (80 mg total) by mouth daily., Disp: 90 tablet, Rfl: 3   clopidogrel  (PLAVIX ) 75 MG tablet, Take 1 tablet (75 mg total) by mouth daily with breakfast., Disp: 90 tablet, Rfl: 3   docusate sodium  (COLACE) 100 MG capsule, Take 1 capsule (100 mg total) by mouth 2 (two) times daily., Disp: 10 capsule, Rfl: 0   ezetimibe  (ZETIA )  10 MG tablet, Take 1 tablet (10 mg total) by mouth daily., Disp: 90 tablet, Rfl: 3   famotidine  (PEPCID ) 20 MG tablet, TAKE 1 TABLET BY MOUTH DAILY, Disp: 90 tablet, Rfl: 0   Fe Fum-Vit C-Vit B12-FA (TRIGELS-F FORTE) CAPS capsule, Take 1 capsule by mouth daily after breakfast. (Patient not taking: Reported on 08/14/2024), Disp: , Rfl:    lamoTRIgine  (LAMICTAL ) 100 MG tablet, TAKE TWO TABLETS BY MOUTH EVERY MORNING and TAKE ONE TABLET BY MOUTH EVERY EVENING, Disp: 270 tablet, Rfl: 1   levothyroxine  (SYNTHROID ) 100 MCG tablet, TAKE 1 TABLET BY MOUTH DAILY BEFORE BREAKFAST, Disp: 90 tablet, Rfl: 1   losartan  (COZAAR ) 25 MG tablet, Take 1 tablet (25 mg total) by mouth daily. (Patient not taking: Reported on 08/14/2024), Disp: 90 tablet, Rfl: 3   metoprolol  tartrate (LOPRESSOR ) 25 MG tablet, Take 1 tablet (25 mg total) by mouth 2 (two) times daily., Disp: 180 tablet, Rfl: 3   oxyCODONE -acetaminophen  (PERCOCET) 10-325 MG tablet, Take 1 tablet by mouth every 6 (six) hours as needed for pain., Disp: 28 tablet, Rfl: 0   polyethylene glycol (MIRALAX  / GLYCOLAX ) 17 g packet, Take 17 g by mouth daily as needed for mild constipation., Disp: 14 each, Rfl: 0   QUEtiapine  (SEROQUEL ) 50 MG tablet, Take 1 tablet (50 mg total) by mouth daily., Disp: 90 tablet, Rfl: 1   traZODone  (DESYREL ) 150 MG tablet, TAKE TWO TABLETS BY MOUTH everyday AT bedtime, Disp: 180 tablet, Rfl: 1   Vitamin D , Ergocalciferol , (DRISDOL ) 1.25 MG (50000 UNIT) CAPS capsule, Take 1 capsule (50,000 Units total) by mouth every Sunday., Disp: 12 capsule, Rfl: 3 [2]  Social History Tobacco Use  Smoking Status Former   Current packs/day: 0.00   Average packs/day: 1 pack/day for 15.0 years (15.0 ttl pk-yrs)   Types: Cigarettes   Start date: 06/29/1964   Quit date: 06/30/1979   Years since quitting: 45.2  Smokeless Tobacco Never

## 2024-09-04 ENCOUNTER — Encounter (HOSPITAL_COMMUNITY)

## 2024-09-06 ENCOUNTER — Encounter (HOSPITAL_COMMUNITY)

## 2024-09-06 NOTE — Progress Notes (Signed)
 "  Cardiology Office Note    Date:  09/12/2024  ID:  Becky Lawson, DOB Feb 12, 1951, MRN [id number] PCP:  Levora Reyes SAUNDERS, MD  Cardiologist:  Gordy Bergamo, MD  Electrophysiologist:  None   Chief Complaint: Follow up for CAD   History of Present Illness: .   Becky Lawson is a 74 y.o. female with visit-pertinent history of prediabetes, hypertension, hyperlipidemia, prior tobacco use.  First evaluated on 09/28/2023 by Dr. Alvan for evaluation of chest pain.   At office visit patient reported that she had had intermittent chest pain for the prior 3 weeks, pain described as a sudden, heavy pressure in the middle chest that sometimes radiated to the chin.  Coronary CTA on 10/12/2023 indicated at least moderate stenosis, coronary calcium  score of 3084, 99th percentile for age, sex and race matched controls.  Total plaque volume 1268 mm3, 96 percentile for age and sex matched control.  CT FFR analysis did not show any significant focal stenosis, there was gradual decrease in FFR across LCx system, FFR drops when the vessel becomes smaller in caliber after OM2 but there is no discrete point of stenosis, proximal FFR 0.98, mid FFR after OM two 0.76, distal FFR of 0.7.  Echocardiogram on 11/08/2023 indicated LVEF of 65 to 70%, no RWMA, grade 1 diastolic dysfunction, RV size and function was normal, LA was moderately dilated, aortic valve sclerosis was evident without aortic valve stenosis.   Patient was seen in clinic on 12/25/23 by Dr. Court patient denied any chest pain however did report jaw pain which was nitrate responsive.  A cardiac PET was ordered to further evaluate.  PET stress on 03/27/2024 indicated abnormal LV perfusion with evidence of ischemia, no evidence of infarction.  There was a medium defect with moderate reduction in uptake present in the apical septal and apex locations that was reversible, there was normal wall motion in the defect area, consistent with ischemia, noted to have severe three-vessel  calcium , abnormal MBF T and 3 times daily suggesting three-vessel CAD and not just distal LAD ischemia.  Patient was seen in clinic on 03/28/2024, she reported continued intermittent episodes of chest discomfort, typically resolve with rest or a single sublingual nitroglycerin .  Patient was set up for cardiac catheterization.  Cardiac catheterization on 04/03/24 Indicated severe three-vessel CAD, had successful PCI/DES to the LCx that was complicated by dissection with recommendations for DAPT with ASA/Plavix  and planned for staged intervention of the LAD and RCA in 2 to 3 weeks.  At heart team meeting on 04/05/2024 after presentation was felt the best option would be to proceed with bypass surgery to the LAD, RCA and potentially if there is any restenosis by relook angiogram in 3 months bypass surgery to circumflex marginal.   On 05/17/2024 patient underwent CABG x 2 with left internal mammary artery graft to the LAD and right internal mammary artery graft to the RCA with Dr. Lucas.  During the evening of postop day 1 she developed atrial fibrillation with RVR, she converted back to sinus rhythm on amiodarone  infusion this was transition to p.o. amiodarone  on postop day 2.  Patient was started back on Plavix  given recent stenting.  She then converted back into atrial fibrillation and was treated with 2 amiodarone  boluses with improvement of heart rate.  Patient was discharged on oral amiodarone , aspirin  80 mg daily, Lipitor  80 mg daily, Plavix  75 mg daily, metoprolol  tartrate 50 mg twice daily.  Patient was discharged to SNF on 05/25/24.  Last seen in clinic on 08/12/2024 for follow-up.  She remained stable from a cardiac standpoint.  She reported that her blood pressure had been steadily increasing following bypass surgery.  Amlodipine  5 mg daily restarted.  Today she presents for follow up. She reports that she has been doing well overall.  She denies any chest pain, any further jaw pain, shortness of breath,  lower extremity edema, without near PND.  She denies any palpitations, presyncope or syncope.  She continues regular attending cardiac rehab, is also attending [employer] 3 days a week, notes overall tolerates well.  She does note she does have some persistent fatigue that has been ongoing.  On further discussion of her medications she reports that she previously stopped taking losartan , is unsure of the timeframe.  She reports that her blood pressure has overall been well-controlled and consistently less than 130/80. ROS: .   Today she denies chest pain, shortness of breath, lower extremity edema, palpitations, melena, hematuria, hemoptysis, diaphoresis, weakness, presyncope, syncope, orthopnea, and PND.  All other systems are reviewed and otherwise negative. Studies Reviewed: [name]   EKG:  EKG is not ordered today.  CV Studies: Cardiac studies reviewed are outlined and summarized above. Otherwise please see EMR for full report. Cardiac Studies & Procedures   ______________________________________________________________________________________________ CARDIAC CATHETERIZATION  CARDIAC CATHETERIZATION 04/03/2024  Conclusion Images from the original result were not included. Cardiac Catheterization 04/03/24: Hemodynamic data: LVEDP 35 mm Hg, Markedly elevated, no pressure gradient across the aortic valve.  Angiographic data: LM: Large-caliber vessel, mildly calcified. LAD: It is a moderate large caliber vessel, proximal LAD has diffuse 30 to 40% calcific stenosis followed by a very focal 95% calcific stenosis at the origin of D2 and is tortuous at this segment.  Distal-apical LAD has a 70% focal stenosis. LCx: Codominant with RCA.  Moderately calcified.  There is a very focal 95% calcific stenosis in the midsegment just proximal to the origin of a very large OM1.  OM1 has a ostial 30 to 40% calcific stenosis. RCA: Codominant with CX, continues as a moderate-sized PL branch with very small PDA,  has a focal 90% stenosis at origin of RV branch in the midsegment.  Intervention data: Successful but difficult procedure due to calcification and distal dissection.  PTCA and stenting of the mid CX and OM1 with 2 overlapping 3.5 x 12 in the CX and 3.0 x 12 mm Synergy XD DES overlapping into the OM1, stenosis reduced from 95% to 0% with TIMI-3 to TIMI-3 flow.    Impression and recommendations: Patient has severely complex three-vessel coronary disease and probably would be better suited for CABG.  I attempted to pass the guidewire into the LAD after the CX PCI, and I was unable to pass a 2.5 mm balloon to predilate the lesion.  There was also spasm mid segment Cx and also a small type a dissection was evident postprocedure in the LAD.  I suspect if she needs PCI to the LAD, it is can be a complex long-term intervention with atherectomy.  RCA lesion is very focal.  I have recommended that we observe her overnight for any potential post PCI complication, discharge in the morning, elective staged intervention to RCA and will take a relook at LAD at that time and consider atherectomy followed by stenting of a focal high-grade mid LAD stenosis avoiding stenting the long proximal segment.  Findings Coronary Findings Diagnostic  Dominance: Co-dominant  Left Anterior Descending Prox LAD to Mid LAD lesion is 40% stenosed.  Mid LAD lesion is 95% stenosed. Dist LAD lesion is 70% stenosed.  Left Circumflex Prox Cx to Mid Cx lesion is 95% stenosed. The lesion is type C. The lesion is moderately calcified.  First Obtuse Marginal Branch 1st Mrg lesion is 40% stenosed.  Right Coronary Artery Prox RCA lesion is 90% stenosed.  Intervention  Prox Cx to Mid Cx lesion Angioplasty Lesion length:  8 mm. CATH VISTA GUIDE 6FR XBLD 3.5 guide catheter was inserted. WIRE RUNTHROUGH .[id number] guidewire used to cross lesion. Balloon angioplasty was performed using a BALLOON EMERGE MR 3.5X15. Maximum pressure: 4  atm. Inflation time: 30 sec. Stent A drug-eluting stent was successfully placed using a STENT SYNERGY XD 3.50X12. Maximum pressure: 13 atm. Inflation time: 45 sec. Stent strut is well apposed. Post-Intervention Lesion Assessment The intervention was successful. Pre-interventional TIMI flow is 3. Post-intervention TIMI flow is 3. No complications occurred at this lesion. There is a 0% residual stenosis post intervention.  1st Mrg lesion Stent A drug-eluting stent was successfully placed using a STENT SYNERGY XD 3.0X12. Maximum pressure: 11 atm. Inflation time: 30 sec. Stent strut is well apposed. Stent overlaps previously placed stent. Post-stent angioplasty was performed. Stent balloon used to post dill overlap at 16 atm for 30 S Post-Intervention Lesion Assessment The intervention was successful. Pre-interventional TIMI flow is 3. Post-intervention TIMI flow is 3. At this lesion, a dissection occurred. As I pulled the LAD guide wire, the guide catheter was sucked into the LAD and there was Prox LAD type A dissection without flow compromise and hence left alone due to complexity of the LAD stenosis. There is a 0% residual stenosis post intervention.   STRESS TESTS  NM PET CT CARDIAC PERFUSION MULTI W/ABSOLUTE BLOODFLOW 03/27/2024  Narrative   LV perfusion is abnormal. There is evidence of ischemia. There is no evidence of infarction. Defect 1: There is a medium defect with moderate reduction in uptake present in the apical septal and apex location(s) that is reversible. There is normal wall motion in the defect area. Consistent with ischemia.   Rest left ventricular function is normal. Rest EF: 59%. Stress left ventricular function is normal. Stress EF: 63%. End diastolic cavity size is normal. End systolic cavity size is normal.   Myocardial blood flow was computed to be 0.46ml/g/min at rest and 1.40ml/g/min at stress. Global myocardial blood flow reserve was 1.52 and was abnormal.   Coronary  calcium  was present on the attenuation correction CT images. Severe coronary calcifications were present. Coronary calcifications were present in the left anterior descending artery, left circumflex artery and right coronary artery distribution(s).   Findings are consistent with ischemia. The study is intermediate risk.   Severely reduced MBFR in area of ischemia 0.88   Severe 3 vessel calcium  , abnormal MBFT and TID suggests 3 vessel CAD and not just distal LAD ischemia  EXAM: OVER-READ INTERPRETATION  PET-CT CHEST  The following report is an over-read performed by radiologist Dr. Camellia Lang [facility] on 03/27/2024. This over-read does not include interpretation of cardiac or coronary anatomy or pathology. The cardiac PET and cardiac CT interpretation by the cardiologist is to be attached.  COMPARISON:  None.  FINDINGS: No evidence for lymphadenopathy within the visualized mediastinum or hilar regions.  The visualized lung parenchyma shows no suspicious pulmonary nodule or mass. No focal airspace consolidation. No effusion.  Visualized portions of the upper abdomen are unremarkable.  No suspicious lytic or sclerotic osseous abnormality.  IMPRESSION: No acute or clinically significant  extracardiac findings.   Electronically Signed By: Camellia Candle M.D. On: 03/27/2024 09:25   ECHOCARDIOGRAM  ECHOCARDIOGRAM COMPLETE 11/08/2023  Narrative ECHOCARDIOGRAM REPORT    Patient Name:   COTY STUDENT  Date of Exam: 11/08/2023 Medical Rec #:  [id number]     Height:       64.5 in Accession #:    [id number]    Weight:       200.0 lb Date of Birth:  1951/08/01     BSA:          1.967 m Patient Age:    72 years      BP:           138/78 mmHg Patient Gender: F             HR:           108 bpm. Exam Location:  [address]  Procedure: 2D Echo, Cardiac Doppler and Color Doppler (Both Spectral and Color Flow Doppler were utilized during procedure).  Indications:     R93.1 CAD  History:        Patient has prior history of Echocardiogram examinations, most recent 06/21/2022. CAD, Signs/Symptoms:Murmur and Chest Pain; Risk Factors:Hypertension, Dyslipidemia and Former Smoker.  Sonographer:    Elsie Bohr RDCS Referring Phys: [phone] MARY E BRANCH  IMPRESSIONS   1. Left ventricular ejection fraction, by estimation, is 65 to 70%. The left ventricle has normal function. The left ventricle has no regional wall motion abnormalities. Left ventricular diastolic parameters are consistent with Grade I diastolic dysfunction (impaired relaxation). 2. Right ventricular systolic function is normal. The right ventricular size is normal. 3. Left atrial size was moderately dilated. 4. The mitral valve is normal in structure. Mild mitral valve regurgitation. No evidence of mitral stenosis. 5. The aortic valve is normal in structure. There is mild calcification of the aortic valve. Aortic valve regurgitation is not visualized. Aortic valve sclerosis/calcification is present, without any evidence of aortic stenosis. 6. There is high flow/turbulence over the pulmonic valve but no evidence of stenosis. 7. The inferior vena cava is normal in size with greater than 50% respiratory variability, suggesting right atrial pressure of 3 mmHg.  FINDINGS Left Ventricle: Left ventricular ejection fraction, by estimation, is 65 to 70%. The left ventricle has normal function. The left ventricle has no regional wall motion abnormalities. The left ventricular internal cavity size was normal in size. There is no left ventricular hypertrophy. Left ventricular diastolic parameters are consistent with Grade I diastolic dysfunction (impaired relaxation).  Right Ventricle: The right ventricular size is normal. No increase in right ventricular wall thickness. Right ventricular systolic function is normal.  Left Atrium: Left atrial size was moderately dilated.  Right Atrium: Right atrial size  was normal in size.  Pericardium: There is no evidence of pericardial effusion.  Mitral Valve: The mitral valve is normal in structure. Mild mitral annular calcification. Mild mitral valve regurgitation. No evidence of mitral valve stenosis.  Tricuspid Valve: The tricuspid valve is normal in structure. Tricuspid valve regurgitation is mild . No evidence of tricuspid stenosis.  Aortic Valve: The aortic valve is normal in structure. There is mild calcification of the aortic valve. Aortic valve regurgitation is not visualized. Aortic valve sclerosis/calcification is present, without any evidence of aortic stenosis.  Pulmonic Valve: The pulmonic valve was normal in structure. Pulmonic valve regurgitation is not visualized. No evidence of pulmonic stenosis.  Aorta: The aortic root is normal in size and structure.  Pulmonary Artery: There  is high flow/turbulence over the pulmonic valve but no evidence of stenosis.  Venous: The inferior vena cava is normal in size with greater than 50% respiratory variability, suggesting right atrial pressure of 3 mmHg.  IAS/Shunts: No atrial level shunt detected by color flow Doppler.   LEFT VENTRICLE PLAX 2D LVIDd:         3.60 cm   Diastology LVIDs:         2.50 cm   LV e' medial:    9.25 cm/s LV PW:         1.20 cm   LV E/e' medial:  11.4 LV IVS:        1.50 cm   LV e' lateral:   17.30 cm/s LVOT diam:     2.10 cm   LV E/e' lateral: 6.1 LV SV:         90 LV SV Index:   46 LVOT Area:     3.46 cm   RIGHT VENTRICLE             IVC RV S prime:     19.20 cm/s  IVC diam: 0.80 cm TAPSE (M-mode): 1.9 cm RVSP:           26.2 mmHg  LEFT ATRIUM             Index        RIGHT ATRIUM           Index LA diam:        4.10 cm 2.08 cm/m   RA Pressure: 3.00 mmHg LA Vol (A2C):   69.8 ml 35.49 ml/m  RA Area:     13.60 cm LA Vol (A4C):   78.3 ml 39.81 ml/m  RA Volume:   32.80 ml  16.68 ml/m LA Biplane Vol: 79.7 ml 40.52 ml/m AORTIC VALVE LVOT Vmax:   136.00  cm/s LVOT Vmean:  94.600 cm/s LVOT VTI:    0.260 m  AORTA Ao Root diam: 3.30 cm Ao Asc diam:  3.50 cm  MITRAL VALVE                TRICUSPID VALVE MV Area (PHT): 2.87 cm     TR Peak grad:   23.2 mmHg MV Decel Time: 264 msec     TR Vmax:        241.00 cm/s MV E velocity: 105.00 cm/s  Estimated RAP:  3.00 mmHg MV A velocity: 134.00 cm/s  RVSP:           26.2 mmHg MV E/A ratio:  0.78 SHUNTS Systemic VTI:  0.26 m Systemic Diam: 2.10 cm  Toribio Fuel MD Electronically signed by Toribio Fuel MD Signature Date/Time: 11/08/2023/3:36:55 PM    Final   TEE  ECHO INTRAOPERATIVE TEE 05/17/2024  Narrative *INTRAOPERATIVE TRANSESOPHAGEAL REPORT *    Patient Name:   ELDINE RENCHER Date of Exam: 05/17/2024 Medical Rec #:  [id number]    Height:       64.5 in Accession #:    [id number]   Weight:       190.0 lb Date of Birth:  August 03, 1951    BSA:          1.92 m Patient Age:    73 years     BP:           158/80 mmHg Patient Gender: F            HR:           66 bpm. Exam Location:  Inpatient  Transesophogeal exam was perform intraoperatively during surgical procedure. Patient was closely monitored under general anesthesia during the entirety of examination.  Indications:     CAD, multiple vessel I25.10 Sonographer:     Koleen Popper RDCS Performing Phys: [id number] POUR BARTLE Diagnosing Phys: Lynwood Cornea  Complications: No known complications during this procedure. POST-OP IMPRESSIONS _ Left Ventricle: The left ventricle is unchanged from pre-bypass. Normal systolic function. _ Right Ventricle: The right ventricle appears unchanged from pre-bypass; normal function. _ Aorta: The aorta appears unchanged from pre-bypass. There is no dissection present in the aorta. _ Left Atrial Appendage: The left atrial appendage appears unchanged from pre-bypass. _ Aortic Valve: The aortic valve appears unchanged from pre-bypass. Trace regurgitation. _ Mitral Valve: The mitral valve appears  unchanged from pre-bypass. There is mild regurgitation. _ Tricuspid Valve: The tricuspid valve appears unchanged from pre-bypass. There is mild regurgitation. _ Pulmonic Valve: The pulmonic valve appears unchanged from pre-bypass. _ Interatrial Septum: The interatrial septum appears unchanged from pre-bypass. _ Pericardium: The pericardium appears unchanged from pre-bypass. _ Comments: No significant pleural or pericardial effusion.  PRE-OP FINDINGS Left Ventricle: The left ventricle has normal systolic function, with an ejection fraction of 55-60%. The cavity size was normal. There is mild concentric left ventricular hypertrophy.  Right Ventricle: The right ventricle has normal systolic function. The cavity was normal. There is no increase in right ventricular wall thickness. Catheter present in the right ventricle.  Left Atrium: Left atrial size was not assessed. No left atrial/left atrial appendage thrombus was detected.  Right Atrium: Right atrial size was not assessed.  Interatrial Septum: No atrial level shunt detected by color flow Doppler.  Pericardium: There is no evidence of pericardial effusion. There is no pleural effusion.  Mitral Valve: The mitral valve is normal in structure. Mitral valve regurgitation is mild by color flow Doppler. There is no evidence of mitral stenosis.  Tricuspid Valve: The tricuspid valve was normal in structure. Tricuspid valve regurgitation is mild by color flow Doppler. No evidence of tricuspid stenosis is present.  Aortic Valve: The aortic valve is tricuspid. Aortic valve regurgitation is trivial by color flow Doppler. There is no stenosis of the aortic valve, with a calculated valve area of 2.42 cm. There is moderate calcification present on the aortic valve left coronary cusp with mildly decreased mobility.  Pulmonic Valve: The pulmonic valve was normal in structure No evidence of pumonic stenosis. Pulmonic valve regurgitation is trivial by  color flow Doppler.   Aorta: The aortic root, ascending aorta and aortic arch are normal in size and structure. There is evidence of plaque in the descending aorta; Grade IV, measuring >22mm in size.  +--------------+--------++ LEFT VENTRICLE          +----------------+---------++ +--------------+--------++  Diastology                PLAX 2D                 +----------------+---------++ +--------------+--------++  LV e' lateral:  7.41 cm/s LVOT diam:    2.30 cm   +----------------+---------++ +--------------+--------++  LV E/e' lateral:9.5       LVOT Area:    4.15 cm  +----------------+---------++ +--------------+--------++                        +--------------+--------++  +---------------+------+-------+ RIGHT VENTRICLE              +---------------+------+-------+ TAPSE (M-mode):1.7 cm2.37 cm +---------------+------+-------+  +------------------+------------++ AORTIC VALVE                   +------------------+------------++  AV Area (Vmax):   2.63 cm     +------------------+------------++ AV Area (Vmean):  2.41 cm     +------------------+------------++ AV Area (VTI):    2.42 cm     +------------------+------------++ AV Vmax:          157.00 cm/s  +------------------+------------++ AV Vmean:         111.000 cm/s +------------------+------------++ AV VTI:           0.347 m      +------------------+------------++ AV Peak Grad:     9.9 mmHg     +------------------+------------++ AV Mean Grad:     6.0 mmHg     +------------------+------------++ LVOT Vmax:        99.50 cm/s   +------------------+------------++ LVOT Vmean:       64.300 cm/s  +------------------+------------++ LVOT VTI:         0.202 m      +------------------+------------++ LVOT/AV VTI ratio:0.58         +------------------+------------++  +--------------+----------++ MITRAL VALVE               +--------------+-------+ +--------------+----------++  SHUNTS                MV Area (PHT):3.12 cm    +--------------+-------+ +--------------+----------++  Systemic VTI: 0.20 m  MV PHT:       70.47 msec  +--------------+-------+ +--------------+----------++  Systemic Diam:2.30 cm MV Decel Time:243 msec    +--------------+-------+ +--------------+----------++ +--------------+----------++ MV E velocity:70.20 cm/s +--------------+----------++ MV A velocity:60.90 cm/s +--------------+----------++ MV E/A ratio: 1.15       +--------------+----------++   Lynwood Cornea Electronically signed by Lynwood Cornea Signature Date/Time: 05/17/2024/3:32:28 PM    Final    CT SCANS  CT CORONARY MORPH W/CTA COR W/SCORE 10/12/2023  Addendum 10/26/2023  9:16 PM ADDENDUM REPORT: 10/26/2023 21:13  EXAM: OVER-READ INTERPRETATION  CT CHEST  The following report is an over-read performed by radiologist Dr. Fonda Mom [facility] on 10/26/2023. This over-read does not include interpretation of cardiac or coronary anatomy or pathology. The coronary CTA interpretation by the cardiologist is attached.  COMPARISON:  None.  FINDINGS: Cardiovascular:  See findings discussed in the body of the report.  Mediastinum/Nodes: No suspicious adenopathy identified. Imaged mediastinal structures are unremarkable.  Lungs/Pleura: There is dependent basilar subsegmental atelectasis. No pneumonia or pulmonary edema. No pleural effusion or pneumothorax.  Upper Abdomen: No acute abnormality.  Musculoskeletal: No chest wall abnormality. No acute osseous findings. There are thoracic degenerative changes.  IMPRESSION:  No acute extracardiac incidental findings.   Electronically Signed By: Fonda Field M.D. On: 10/26/2023 21:13  Narrative HISTORY: Chest pain/anginal equiv, intermediate CAD risk, treadmill candidate  EXAM: Cardiac/Coronary  CT  TECHNIQUE: The patient was scanned on a [employer].  PROTOCOL: A 120 kV prospective scan was triggered in the descending thoracic aorta at 111 HU's. Axial non-contrast 3 mm slices were carried out through the heart. The data set was analyzed on a dedicated work station and scored using the Agatston method. Gantry rotation speed was 250 msecs and collimation was 0.6 mm. Heart rate was optimized medically and sl NTG was given. The 3D data set was reconstructed in 5% intervals of the 35-75 % of the R-R cycle. Systolic and diastolic phases were analyzed on a dedicated work station using MPR, MIP and VRT modes. The patient received 95mL OMNIPAQUE  IOHEXOL  350 MG/ML SOLN of contrast.  FINDINGS: Coronary calcium  score: The patient's coronary artery calcium  score is 3084, which places the patient in the 99th percentile.  Coronary arteries: Normal coronary origins.  Co- dominance.  Right Coronary Artery: Normal caliber vessel, gives rise to right PDA. Diffuse mixed calcified and noncalcified plaque throughout primarily proximal vessel. Maximum stenosis 25-49% proximal, 1-24% mid, and 1-24% distal.  Left Main Coronary Artery: Normal caliber vessel. No significant plaque or stenosis.  Left Anterior Descending Coronary Artery: Normal caliber vessel. Diffuse mixed calcified and noncalcified plaque throughout vessel. Maximum stenosis 25-49% proximal, 50-69% mid, and 50-69% distal. Gives rise to two small diagonal branches.  Left Circumflex Artery: Normal caliber vessel, gives rise to left PDA. Diffuse mixed calcified and noncalcified plaque throughout vessel. Maximum stenosis 25-49% proximal, 25-49% mid, and 50-69% distal. Gives rise to small first, large second, medium third OM branches.  Aorta: Normal size, 35 mm at the mid ascending aorta (level of the PA bifurcation) measured double oblique. Aortic atherosclerosis. No dissection seen in visualized portions of the  aorta.  Aortic Valve: Mild calcifications. Trileaflet.  Other findings:  Normal pulmonary vein drainage into the left atrium.  Normal left atrial appendage without a thrombus.  Normal size of the pulmonary artery.  Normal appearance of the pericardium.  IMPRESSION: 1. CAD with at least moderate stenosis, CADRADS = 3. CT FFR will be performed and reported separately. Heavy calcification limits interpretation in certain segments.  2. Coronary calcium  score of 3084. This was 99th percentile for age-, sex-, and race- matched controls.  3. Total plaque volume 1268 mm3 which is 96th percentile for age- and sex- matched controls (calcified plaque 460 mm3; noncalcified plaque 808 mm3). Total plaque volume is extensive.  4. Normal coronary origin with co- dominance.  INTERPRETATION:  CAD-RADS 3: Moderate stenosis (50-69%). Consider symptom-guided anti-ischemic pharmacotherapy as well as risk factor modification per guideline directed care. Additional analysis with CT FFR will be submitted.  Electronically Signed: By: Shelda Bruckner M.D. On: 10/13/2023 14:14     ______________________________________________________________________________________________       Current Reported Medications:.    Active Medications[1]  Physical Exam:    VS:  BP 124/62   Pulse 68   Ht 5' 4.5 (1.638 m)   Wt 180 lb 6.4 oz (81.8 kg)   SpO2 98%   BMI 30.49 kg/m    Wt Readings from Last 3 Encounters:  09/12/24 180 lb 6.4 oz (81.8 kg)  08/14/24 181 lb 6.4 oz (82.3 kg)  08/12/24 182 lb 3.2 oz (82.6 kg)    GEN: Well nourished, well developed in no acute distress NECK: No JVD; No carotid bruits CARDIAC: RRR, no murmurs, rubs, gallops RESPIRATORY:  Clear to auscultation without rales, wheezing or rhonchi  ABDOMEN: Soft, non-tender, non-distended EXTREMITIES:  No edema; No acute deformity     Asessement and Plan:.    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  At heart team meeting on 04/05/2024 after presentation was felt the best option would be to proceed with bypass surgery to the LAD, RCA.  Patient underwent CABG x 2 with left internal mammary artery graft to the LAD and right internal mammary artery graft to the RCA with Dr. Lucas on 05/17/24. Stable with no anginal symptoms. No indication for ischemic evaluation.  Heart healthy diet and regular cardiovascular exercise encouraged.  Patient continues at cardiac rehab and is attending [employer] sne[employer], overall tolerates well.  She does note some  ongoing fatigue that has not significantly changed.  Check CBC and BMET today. Continue reviewed ED precautions.  Continue amlodipine  5 mg daily, aspirin  81 mg daily, Lipitor  80 mg daily, Plavix  75 mg daily, metoprolol  tartrate 25 mg twice daily.  Postoperative atrial fibrillation: Patient with postoperative atrial fibrillation. She was on amiodarone  until 10/20.  Today she denies any palpitations or feeling of irregular heartbeats.  She is agreeable to 2-week cardiac monitor to reevaluate for postoperative atrial fibrillation.  Continue metoprolol .  Hypertension: Blood pressure today 124/62, she reports that her blood pressure has been well controlled at home. She reports she has not been taking losartan  in recent weeks.  As her blood pressure has been well-controlled, we will continue on current antihypertensive regimen and discontinue losartan .   Hyperlipidemia: Last lipid file in 04/24/2024 indicated total cholesterol 130, HDL 49, triglycerides 89 and LDL 64. Continue Lipitor  80 mg daily and Zetia  10 mg daily.    Disposition: F/u with Dr. Ladona in four months or sooner if needed.   Signed, Inayah Woodin D Dandrea Widdowson, NP       [1]  Current Meds  Medication Sig    ALPRAZolam  (XANAX ) 1 MG tablet TAKE 1/2 TO 1 TABLET BY MOUTH twice daily AS NEEDED FOR ANXIETY   amLODipine  (NORVASC ) 5 MG tablet Take 1 tablet (5 mg total) by mouth daily.   aspirin  EC 81 MG tablet Take 1 tablet (81 mg total) by mouth daily. Swallow whole.   atorvastatin  (LIPITOR ) 80 MG tablet Take 1 tablet (80 mg total) by mouth daily.   clopidogrel  (PLAVIX ) 75 MG tablet Take 1 tablet (75 mg total) by mouth daily with breakfast.   docusate sodium  (COLACE) 100 MG capsule Take 1 capsule (100 mg total) by mouth 2 (two) times daily.   ezetimibe  (ZETIA ) 10 MG tablet Take 1 tablet (10 mg total) by mouth daily.   famotidine  (PEPCID ) 20 MG tablet TAKE 1 TABLET BY MOUTH DAILY   lamoTRIgine  (LAMICTAL ) 100 MG tablet TAKE TWO TABLETS BY MOUTH EVERY MORNING and TAKE ONE TABLET BY MOUTH EVERY EVENING   levothyroxine  (SYNTHROID ) 100 MCG tablet TAKE 1 TABLET BY MOUTH DAILY BEFORE BREAKFAST   metoprolol  tartrate (LOPRESSOR ) 25 MG tablet Take 1 tablet (25 mg total) by mouth 2 (two) times daily.   oxyCODONE -acetaminophen  (PERCOCET) 10-325 MG tablet Take 1 tablet by mouth every 6 (six) hours as needed for pain.   polyethylene glycol (MIRALAX  / GLYCOLAX ) 17 g packet Take 17 g by mouth daily as needed for mild constipation.   QUEtiapine  (SEROQUEL ) 50 MG tablet Take 1 tablet (50 mg total) by mouth daily.   traZODone  (DESYREL ) 150 MG tablet TAKE TWO TABLETS BY MOUTH everyday AT bedtime   Vitamin D , Ergocalciferol , (DRISDOL ) 1.25 MG (50000 UNIT) CAPS capsule Take 1 capsule (50,000 Units total) by mouth every Sunday.   "

## 2024-09-09 ENCOUNTER — Encounter (HOSPITAL_COMMUNITY)

## 2024-09-09 ENCOUNTER — Telehealth (HOSPITAL_COMMUNITY): Payer: Self-pay

## 2024-09-09 NOTE — Telephone Encounter (Signed)
 Called patient regarding 3x no call, no shows in a row for 12:30 CR class. Patient states she has been having trouble with her furnace but will be in Wednesday.

## 2024-09-11 ENCOUNTER — Encounter (HOSPITAL_COMMUNITY)
Admission: RE | Admit: 2024-09-11 | Discharge: 2024-09-11 | Disposition: A | Discharge status: Home / Self Care | Source: Ambulatory Visit | Attending: Cardiology | Admitting: Cardiology

## 2024-09-11 DIAGNOSIS — Z48812 Encounter for surgical aftercare following surgery on the circulatory system: Secondary | ICD-10-CM | POA: Diagnosis present

## 2024-09-11 DIAGNOSIS — Z951 Presence of aortocoronary bypass graft: Secondary | ICD-10-CM | POA: Insufficient documentation

## 2024-09-11 NOTE — Progress Notes (Signed)
Reviewed home exercise Rx with patient today.  Encouraged warm-up, cool-down, and stretching. Reviewed THRR of 59 - 118 and keeping RPE between 11-13. Encouraged to hydrate with activity.  ?Reviewed weather parameters for temperature and humidity for safe exercise outdoors. Reviewed S/S to terminate exercise and when to call 911 vs MD. Reviewed the use of NTG and pt was encouraged to carry at all times. Pt encouraged to always carry a cell phone for safety when exercising outdoors. Pt verbalized understanding of the home exercise Rx and was provided a copy.  ? ?Gates Jividen MS, ACSM-CEP, CCRP  ?

## 2024-09-12 ENCOUNTER — Ambulatory Visit

## 2024-09-12 ENCOUNTER — Ambulatory Visit: Attending: Cardiology | Admitting: Cardiology

## 2024-09-12 ENCOUNTER — Encounter: Payer: Self-pay | Admitting: Cardiology

## 2024-09-12 VITALS — BP 124/62 | HR 68 | Ht 64.5 in | Wt 180.4 lb

## 2024-09-12 DIAGNOSIS — Z79899 Other long term (current) drug therapy: Secondary | ICD-10-CM | POA: Diagnosis not present

## 2024-09-12 DIAGNOSIS — E782 Mixed hyperlipidemia: Secondary | ICD-10-CM

## 2024-09-12 DIAGNOSIS — I4891 Unspecified atrial fibrillation: Secondary | ICD-10-CM

## 2024-09-12 DIAGNOSIS — I10 Essential (primary) hypertension: Secondary | ICD-10-CM

## 2024-09-12 DIAGNOSIS — I2510 Atherosclerotic heart disease of native coronary artery without angina pectoris: Secondary | ICD-10-CM | POA: Diagnosis not present

## 2024-09-12 NOTE — Patient Instructions (Signed)
 Medication Instructions:  Your physician recommends that you continue on your current medications as directed. Please refer to the Current Medication list given to you today.  *If you need a refill on your cardiac medications before your next appointment, please call your pharmacy*  Lab Work: TODAY: CBC, BMET  If you have labs (blood work) drawn today and your tests are completely normal, you will receive your results only by: MyChart Message (if you have MyChart) OR A paper copy in the mail If you have any lab test that is abnormal or we need to change your treatment, we will call you to review the results.  Testing/Procedures: NONE  Follow-Up: At [facility], you and your health needs are our priority.  As part of our continuing mission to provide you with exceptional heart care, our providers are all part of one team.  This team includes your primary Cardiologist (physician) and Advanced Practice Providers or APPs (Physician Assistants and Nurse Practitioners) who all work together to provide you with the care you need, when you need it.  Your next appointment:   4 month(s)  Provider:   Gordy Bergamo, MD    We recommend signing up for the patient portal called MyChart.  Sign up information is provided on this After Visit Summary.  MyChart is used to connect with patients for Virtual Visits (Telemedicine).  Patients are able to view lab/test results, encounter notes, upcoming appointments, etc.  Non-urgent messages can be sent to your provider as well.   To learn more about what you can do with MyChart, go to [email or web address].   Other Instructions ZIO XT- Long Term Monitor Instructions  Your physician has requested you wear a ZIO patch monitor for __14__ days.   This is a single patch monitor. [employer] supplies one patch monitor per enrollment. Additional  stickers are not available. Please do not apply patch if you will be having a Nuclear Stress Test,   Echocardiogram, Cardiac CT, MRI, or Chest Xray during the period you would be wearing the  monitor. The patch cannot be worn during these tests. You cannot remove and re-apply the  ZIO XT patch monitor.   Your ZIO patch monitor will be mailed 3 day USPS to your address on file. It may take 3-5 days  to receive your monitor after you have been enrolled.   Once you have received your monitor, please review the enclosed instructions. Your monitor  has already been registered assigning a specific monitor serial # to you.     Billing and Patient Assistance Program Information  We have supplied [employer] with any of your insurance information on file for billing purposes.  [employer] offers a sliding scale Patient Assistance Program for patients that do not have  insurance, or whose insurance does not completely cover the cost of the ZIO monitor.  You must apply for the Patient Assistance Program to qualify for this discounted rate.   To apply, please call [employer] at [phone], select option 4, select option 2, ask to apply for  Patient Assistance Program. Meredeth will ask your household income, and how many people  are in your household. They will quote your out-of-pocket cost based on that information.  [employer] will also be able to set up a 40-month, interest-free payment plan if needed.     Applying the monitor  Shave hair from upper left chest.  Hold abrader disc by orange tab. Rub abrader in 40 strokes over the upper left chest as  indicated in  your monitor instructions.  Clean area with 4 enclosed alcohol pads. Let dry.  Apply patch as indicated in monitor instructions. Patch will be placed under collarbone on left  side of chest with arrow pointing upward.  Rub patch adhesive wings for 2 minutes. Remove white label marked 1. Remove the white  label marked 2. Rub patch adhesive wings for 2 additional minutes.  While looking in a mirror, press and release button in center of patch. A  small green light will  flash 3-4 times. This will be your only indicator that the monitor has been turned on.  Do not shower for the first 24 hours. You may shower after the first 24 hours.  Press the button if you feel a symptom. You will hear a small click. Record Date, Time and  Symptom in the Patient Logbook.  When you are ready to remove the patch, follow instructions on the last 2 pages of Patient  Logbook. Stick patch monitor onto the last page of Patient Logbook.   Place Patient Logbook in the blue and white box. Use locking tab on box and tape box closed  securely. The blue and white box has prepaid postage on it. Please place it in the mailbox as  soon as possible. Your physician should have your test results approximately 7 days after the  monitor has been mailed back to [facility].   Call [facility] Customer Care at [phone] if you have questions regarding  your ZIO XT patch monitor. Call them immediately if you see an orange light blinking on your  monitor.   If your monitor falls off in less than 4 days, contact our Monitor department at [phone].   If your monitor becomes loose or falls off after 4 days call [employer] at [phone] for  suggestions on securing your monitor.

## 2024-09-12 NOTE — Progress Notes (Unsigned)
Enrolled patient for a 14 day Zio XT monitor to be mailed to patients home   Ganji to read

## 2024-09-13 ENCOUNTER — Encounter (HOSPITAL_COMMUNITY)
Admission: RE | Admit: 2024-09-13 | Discharge: 2024-09-13 | Disposition: A | Source: Ambulatory Visit | Attending: Cardiology

## 2024-09-13 ENCOUNTER — Ambulatory Visit: Payer: Self-pay | Admitting: Cardiology

## 2024-09-13 DIAGNOSIS — Z951 Presence of aortocoronary bypass graft: Secondary | ICD-10-CM

## 2024-09-13 DIAGNOSIS — Z79899 Other long term (current) drug therapy: Secondary | ICD-10-CM

## 2024-09-13 DIAGNOSIS — Z48812 Encounter for surgical aftercare following surgery on the circulatory system: Secondary | ICD-10-CM | POA: Diagnosis not present

## 2024-09-13 LAB — BASIC METABOLIC PANEL WITH GFR
BUN/Creatinine Ratio: 21 (ref 12–28)
BUN: 20 mg/dL (ref 8–27)
CO2: 20 mmol/L (ref 20–29)
Calcium: 9.3 mg/dL (ref 8.7–10.3)
Chloride: 102 mmol/L (ref 96–106)
Creatinine, Ser: 0.94 mg/dL (ref 0.57–1.00)
Glucose: 93 mg/dL (ref 70–99)
Potassium: 5.4 mmol/L — ABNORMAL HIGH (ref 3.5–5.2)
Sodium: 139 mmol/L (ref 134–144)
eGFR: 64 mL/min/1.73

## 2024-09-13 LAB — CBC
Hematocrit: 34.3 % (ref 34.0–46.6)
Hemoglobin: 10.3 g/dL — ABNORMAL LOW (ref 11.1–15.9)
MCH: 28.1 pg (ref 26.6–33.0)
MCHC: 30 g/dL — ABNORMAL LOW (ref 31.5–35.7)
MCV: 94 fL (ref 79–97)
Platelets: 302 x10E3/uL (ref 150–450)
RBC: 3.66 x10E6/uL — ABNORMAL LOW (ref 3.77–5.28)
RDW: 14.8 % (ref 11.7–15.4)
WBC: 5.9 x10E3/uL (ref 3.4–10.8)

## 2024-09-16 ENCOUNTER — Encounter (HOSPITAL_COMMUNITY)
Admission: RE | Admit: 2024-09-16 | Discharge: 2024-09-16 | Disposition: A | Source: Ambulatory Visit | Attending: Cardiology

## 2024-09-16 DIAGNOSIS — Z951 Presence of aortocoronary bypass graft: Secondary | ICD-10-CM

## 2024-09-16 DIAGNOSIS — Z48812 Encounter for surgical aftercare following surgery on the circulatory system: Secondary | ICD-10-CM | POA: Diagnosis not present

## 2024-09-17 ENCOUNTER — Telehealth: Payer: Self-pay | Admitting: Cardiology

## 2024-09-17 DIAGNOSIS — E875 Hyperkalemia: Secondary | ICD-10-CM

## 2024-09-17 NOTE — Telephone Encounter (Signed)
 Patient wants a call back to discuss next steps regarding her abnormal labs.

## 2024-09-17 NOTE — Telephone Encounter (Signed)
 Spoke with patient regarding lab work that was done 09/12/24.  Per Katlyn West, NP: Please let Becky Lawson know that her CBC shows no evidence of infection, her hemoglobin remains slightly low however is improving postsurgery. Her kidney function is normal. Her potassium level is elevated, recommend she discontinue any use of potassium supplements, electrolyte beverages such as Gatorade or liquid IV, also recommend that she decrease intake of high potassium foods such as bananas, squash, yogurt, white beans, sweet potatoes, leafy greens, and avocados. Recommend repeat basic metabolic profile in one week for monitoring of potassium level.   Educated patient on foods to avoid. Pt concerned she was anemic. Explained to patient, postsurgery her Hgb levels were low, but they are improving. Will place order for lab work to be done next week and pt verbalized she would get done Monday or Tuesday. Verbalizes understanding of plan.

## 2024-09-17 NOTE — Addendum Note (Signed)
 Addended by: BETHENA MOATS R on: 09/17/2024 11:28 AM   Modules accepted: Orders

## 2024-09-18 ENCOUNTER — Encounter (HOSPITAL_COMMUNITY)
Admission: RE | Admit: 2024-09-18 | Discharge: 2024-09-18 | Disposition: A | Discharge status: Home / Self Care | Source: Ambulatory Visit | Attending: Cardiology | Admitting: Cardiology

## 2024-09-18 DIAGNOSIS — Z48812 Encounter for surgical aftercare following surgery on the circulatory system: Secondary | ICD-10-CM | POA: Diagnosis not present

## 2024-09-18 DIAGNOSIS — Z951 Presence of aortocoronary bypass graft: Secondary | ICD-10-CM

## 2024-09-20 ENCOUNTER — Telehealth (HOSPITAL_COMMUNITY): Payer: Self-pay

## 2024-09-20 ENCOUNTER — Encounter (HOSPITAL_COMMUNITY)

## 2024-09-20 NOTE — Telephone Encounter (Signed)
 Patient c/o for 12:30 CR class, states she did not sleep well last night.

## 2024-09-23 ENCOUNTER — Encounter (HOSPITAL_COMMUNITY)
Admission: RE | Admit: 2024-09-23 | Discharge: 2024-09-23 | Disposition: A | Source: Ambulatory Visit | Attending: Cardiology

## 2024-09-23 ENCOUNTER — Other Ambulatory Visit: Payer: Self-pay | Admitting: Family Medicine

## 2024-09-23 DIAGNOSIS — Z951 Presence of aortocoronary bypass graft: Secondary | ICD-10-CM

## 2024-09-23 DIAGNOSIS — Z48812 Encounter for surgical aftercare following surgery on the circulatory system: Secondary | ICD-10-CM | POA: Diagnosis not present

## 2024-09-23 DIAGNOSIS — E890 Postprocedural hypothyroidism: Secondary | ICD-10-CM

## 2024-09-25 ENCOUNTER — Encounter (HOSPITAL_COMMUNITY)
Admission: RE | Admit: 2024-09-25 | Discharge: 2024-09-25 | Disposition: A | Source: Ambulatory Visit | Attending: Cardiology

## 2024-09-25 DIAGNOSIS — Z951 Presence of aortocoronary bypass graft: Secondary | ICD-10-CM

## 2024-09-25 DIAGNOSIS — Z48812 Encounter for surgical aftercare following surgery on the circulatory system: Secondary | ICD-10-CM | POA: Diagnosis not present

## 2024-09-26 LAB — BASIC METABOLIC PANEL WITH GFR
BUN/Creatinine Ratio: 12 (ref 12–28)
BUN: 11 mg/dL (ref 8–27)
CO2: 21 mmol/L (ref 20–29)
Calcium: 8.9 mg/dL (ref 8.7–10.3)
Chloride: 105 mmol/L (ref 96–106)
Creatinine, Ser: 0.89 mg/dL (ref 0.57–1.00)
Glucose: 92 mg/dL (ref 70–99)
Potassium: 5.4 mmol/L — ABNORMAL HIGH (ref 3.5–5.2)
Sodium: 139 mmol/L (ref 134–144)
eGFR: 68 mL/min/1.73

## 2024-09-27 ENCOUNTER — Encounter (HOSPITAL_COMMUNITY)
Admission: RE | Admit: 2024-09-27 | Discharge: 2024-09-27 | Disposition: A | Source: Ambulatory Visit | Attending: Cardiology

## 2024-09-27 ENCOUNTER — Telehealth: Payer: Self-pay

## 2024-09-27 DIAGNOSIS — Z48812 Encounter for surgical aftercare following surgery on the circulatory system: Secondary | ICD-10-CM | POA: Diagnosis not present

## 2024-09-27 DIAGNOSIS — Z951 Presence of aortocoronary bypass graft: Secondary | ICD-10-CM

## 2024-09-27 MED ORDER — LOKELMA 10 G PO PACK: 10.0000 g | PACK | Freq: Once | ORAL | Status: AC

## 2024-09-27 NOTE — Telephone Encounter (Signed)
 Patient picked up sample packed of Lokelma  and copy of Lab work paper

## 2024-09-30 ENCOUNTER — Encounter (HOSPITAL_COMMUNITY)

## 2024-10-01 NOTE — Progress Notes (Signed)
 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, genotype 1b, followed at [facility]   Hyperlipidemia    Hypertension    Hypothyroidism    Jaundice 11/01/2012   Pill dysphagia 10/20/2021   Pruritic disorder 02/13/2013   Sleep apnea    Was diagnosed approximately 20 years ago, but does not wear CPAP patient stated I gave it back.SABRASABRAI couldn't wear that thing it was horrible   Spinal stenosis    Thyroid  disease    Phreesia 06/15/2020   Unintentional weight loss of more than 10 pounds 07/12/2018    Tobacco Use: Tobacco Use History[2]  Labs: Review Flowsheet  More data exists      Latest Ref Rng & Units 05/12/2023 09/26/2023 04/24/2024 05/15/2024 05/17/2024  Labs for ITP Cardiac and Pulmonary Rehab  Cholestrol 100 - 199 mg/dL 850  - 869   - -  LDL (calc) 0 - 99 mg/dL 73  - 64  - -  HDL-C >60 mg/dL 49.29  - 49  - -  Trlycerides 0 - 149 mg/dL 875.9  - 89  - -  Hemoglobin A1c 4.8 - 5.6 % - 6.0  - 5.2  -  PH, Arterial 7.35 - 7.45 - - - - 7.306  7.290  7.299  7.404  7.410  7.447  7.305   PCO2 arterial 32 - 48 mmHg - - - - 46.8  45.8  45.1  35.6  36.1  32.4  38.2   Bicarbonate 20.0 - 28.0 mmol/L - - - - 23.3  22.1  22.3  22.8  22.9  23.3  22.4  19.0   TCO2 22 - 32 mmol/L - - - - 25  24  24  24  24  25  26  24  23  23  20  25    Acid-base deficit 0.0 - 2.0 mmol/L - - - - 3.0  4.0  4.0  2.0  2.0  1.0  1.0  7.0   O2 Saturation % - - - - 97  98  98  99  100  80  100  100     Details       Multiple values from one day are sorted in reverse-chronological order  Capillary Blood Glucose: Lab Results  Component Value Date   GLUCAP 105 (H) 05/24/2024   GLUCAP 112 (H) 05/24/2024   GLUCAP 106 (H) 05/21/2024   GLUCAP 113 (H) 05/21/2024   GLUCAP 117 (H) 05/21/2024     Exercise Target Goals: Exercise Program Goal: Individual exercise prescription set using results from initial 6 min walk test and THRR while considering  patients activity barriers and safety.   Exercise Prescription Goal: Initial exercise prescription builds to 30-45 minutes a day of aerobic activity, 2-3 days per week.  Home exercise guidelines will be given to patient during program as part of exercise prescription that the participant will acknowledge.  Activity Barriers & Risk Stratification:  Activity Barriers & Cardiac Risk Stratification - 07/18/24 1540       Activity Barriers & Cardiac Risk Stratification   Activity Barriers Balance Concerns;History of Falls;Joint Problems;Arthritis;Back Problems;Deconditioning;Muscular [employer] Device;Shortness of Breath    Cardiac Risk Stratification High          6 Minute Walk:  6 Minute Walk     Row Name 07/18/24 1447         6 Minute Walk   Phase Initial  Pt used go-cart for stability      Distance 1003 feet     Walk Time 6 minutes     # of Rest Breaks 0     MPH 1.9     METS 2.21     RPE 9     Perceived Dyspnea  0.5     VO2 Peak 7.73     Symptoms Yes (comment)     Comments 7/10 chronic rt low back/hip pain     Resting HR 69 bpm     Resting BP 142/70     Max Ex. HR 103 bpm     Max Ex. BP 156/84     2 Minute Post BP 130/78        Oxygen Initial Assessment:   Oxygen Re-Evaluation:   Oxygen Discharge (Final Oxygen Re-Evaluation):   Initial Exercise Prescription:  Initial Exercise Prescription - 07/18/24 1500       Date of Initial Exercise RX and Referring Provider   Date 07/18/24    Referring Provider Gordy Bergamo, MD    Expected Discharge Date 10/09/24      NuStep   Level 1    SPM 75    Minutes 15    METs 2.2      Prescription Details   Frequency (times per week) 3    Duration Progress to 30 minutes of continuous aerobic without signs/symptoms of physical distress      Intensity   THRR 40-80% of Max Heartrate 59-118    Ratings of Perceived Exertion 11-13    Perceived Dyspnea 0-4      Progression   Progression Continue progressive overload as per policy without signs/symptoms or physical distress.      Resistance Training   Training Prescription Yes    Weight 2 lbs    Reps 10-15          Perform Capillary Blood Glucose checks as needed.  Exercise Prescription Changes:   Exercise Prescription Changes     Row Name 07/22/24 1600 08/05/24 1700 08/23/24 1500 09/11/24 1400 09/23/24 1600     Response to Exercise   Blood Pressure (Admit) 120/72 128/72 130/74 122/60 132/70   Blood Pressure (Exercise) 124/70 152/74 140/76 -- --   Blood Pressure (Exit) 102/54 132/56 130/64 94/42 112/60   Heart Rate (Admit) 70  bpm 65 bpm 70 bpm 82 bpm 80 bpm   Heart Rate (Exercise) 81 bpm 85 bpm 121 bpm 93 bpm 84 bpm   Heart Rate (Exit) 75 bpm 68 bpm 63 bpm 66 bpm 62 bpm   Rating of Perceived Exertion (Exercise) 9 7 11 11 11    Symptoms None None None  None None   Comments Pt's first day in the CRP2 program Reviewed METs Reviewed METs and goals Reviewed home exercise Rx and METs Reviewed METs and goals   Duration Progress to 30 minutes of  aerobic without signs/symptoms of physical distress Continue with 30 min of aerobic exercise without signs/symptoms of physical distress. Progress to 30 minutes of  aerobic without signs/symptoms of physical distress Continue with 30 min of aerobic exercise without signs/symptoms of physical distress. Continue with 30 min of aerobic exercise without signs/symptoms of physical distress.   Intensity THRR unchanged THRR unchanged THRR unchanged THRR unchanged THRR unchanged     Progression   Progression Continue to progress workloads to maintain intensity without signs/symptoms of physical distress. Continue to progress workloads to maintain intensity without signs/symptoms of physical distress. Continue to progress workloads to maintain intensity without signs/symptoms of physical distress. Continue to progress workloads to maintain intensity without signs/symptoms of physical distress. Continue to progress workloads to maintain intensity without signs/symptoms of physical distress.   Average METs 1.7 2.2 2 2  2.3     Resistance Training   Training Prescription Yes Yes -- -- --   Weight 2 lbs 2 lbs 2 lbs No wts on Wednesdays 2 lbs   Reps 10-15 10-15 10-15 -- 10-15   Time 5 Minutes 5 Minutes 5 Minutes -- 5 Minutes     Interval Training   Interval Training No No No No No     NuStep   Level 1 2 2 2 2    SPM 67 85 83 85 91   Minutes 25 30 30 30 30    METs 1.7 2.2 2 2  2.3     Home Exercise Plan   Plans to continue exercise at -- -- -- [employer] (comment) [employer] (comment)   Frequency -- -- -- Add 3 additional days to program exercise sessions. Add 3 additional days to program exercise sessions.   Initial Home Exercises Provided -- -- -- 09/11/24 09/11/24      Exercise Comments:    Exercise Comments     Row Name 07/22/24 1634 08/05/24 1729 08/23/24 1529 09/11/24 1406 09/20/24 1551   Exercise Comments Pt's first day in the program. Tolerated 25 minutes on nustep. Pt did some weight exercises and stretches due to her chronic low back problems. Reviewed METs. Pt is making good progress. Has increased her duration to 30 minutes as well as her METs Reviewed METs and goals. Pt is inconsistant in her METs. Reviewed METs concept again and encouraged more consistancy. Reviewed METs and hoame exercise Rx with patient. METs are at a plateau. Pt gets distracted watching TV and does not pay attention to her SPM. Continue to encourage her to watch SPM. PPt has been going to [employer] 3x week on the [employer] program. Pt does walking, stnading and chair exercises for 30 minutes in a class at the gym. Pt will continue this. Pt verbalized understanding of the home exercise Rx and was provided a copy. Pt is at goal for 150 minutes of exercise per week. Pt due for review of METs and goals today but did not attend. Will complete upon her return.  Row Name 09/23/24 1604           Exercise Comments Reviewed METs and goals. Pt is making progress on her goals and has been able to increase her METs on the Nustep. Slow progress.          Exercise Goals and Review:   Exercise Goals     Row Name 07/18/24 1541             Exercise Goals   Increase Physical Activity Yes       Intervention Provide advice, education, support and counseling about physical activity/exercise needs.;Develop an individualized exercise prescription for aerobic and resistive training based on initial evaluation findings, risk stratification, comorbidities and participant's personal goals.       Expected Outcomes Short Term: Attend rehab on a regular basis to increase amount of physical activity.;Long Term: Add in home exercise to make exercise part of routine and to increase amount of physical activity.;Long  Term: Exercising regularly at least 3-5 days a week.       Increase Strength and Stamina Yes       Intervention Provide advice, education, support and counseling about physical activity/exercise needs.;Develop an individualized exercise prescription for aerobic and resistive training based on initial evaluation findings, risk stratification, comorbidities and participant's personal goals.       Expected Outcomes Short Term: Increase workloads from initial exercise prescription for resistance, speed, and METs.;Short Term: Perform resistance training exercises routinely during rehab and add in resistance training at home;Long Term: Improve cardiorespiratory fitness, muscular endurance and strength as measured by increased METs and functional capacity ( )       Able to understand and use rate of perceived exertion (RPE) scale Yes       Intervention Provide education and explanation on how to use RPE scale       Expected Outcomes Short Term: Able to use RPE daily in rehab to express subjective intensity level;Long Term:  Able to use RPE to guide intensity level when exercising independently       Knowledge and understanding of Target Heart Rate Range (THRR) Yes       Intervention Provide education and explanation of THRR including how the numbers were predicted and where they are located for reference       Expected Outcomes Short Term: Able to state/look up THRR;Long Term: Able to use THRR to govern intensity when exercising independently;Short Term: Able to use daily as guideline for intensity in rehab       Understanding of Exercise Prescription Yes       Intervention Provide education, explanation, and written materials on patient's individual exercise prescription       Expected Outcomes Short Term: Able to explain program exercise prescription;Long Term: Able to explain home exercise prescription to exercise independently          Exercise Goals Re-Evaluation :  Exercise Goals Re-Evaluation      Row Name 07/22/24 1633 08/23/24 1526 09/23/24 1606         Exercise Goal Re-Evaluation   Exercise Goals Review Increase Physical Activity;Increase Strength and Stamina;Able to understand and use rate of perceived exertion (RPE) scale;Knowledge and understanding of Target Heart Rate Range (THRR);Understanding of Exercise Prescription Increase Physical Activity;Increase Strength and Stamina;Able to understand and use rate of perceived exertion (RPE) scale;Knowledge and understanding of Target Heart Rate Range (THRR);Understanding of Exercise Prescription Increase Physical Activity;Increase Strength and Stamina;Able to understand and use rate of perceived exertion (RPE) scale;Knowledge and understanding of Target Heart  Rate Range (THRR);Understanding of Exercise Prescription     Comments Pt's first day in the CRP2 program. Pt understands the exercise Rx, RPE scale and THRR. Reviewed METs and goals. Pt is making progess on her goals of improved strength, stamina and endurance. Pt is going to the [employer] program 2xweek and doing walking and doind an aerobic exericse class. Reviewed METs and goals. Pt continue to voice progess on her goals of improved strength, stamina and endurance. Pt contune at the the silver sne[employer] program, but now attends 3xweek and doing walking and doind an aerobic exericse class.     Expected Outcomes Will continue to monitor the patient and progress workloads as tolerated. Will continue to monitor the patient and progress workloads as tolerated. Will continue to monitor the patient and progress workloads as tolerated.        Discharge Exercise Prescription (Final Exercise Prescription Changes):  Exercise Prescription Changes - 09/23/24 1600       Response to Exercise   Blood Pressure (Admit) 132/70    Blood Pressure (Exit) 112/60    Heart Rate (Admit) 80 bpm    Heart Rate (Exercise) 84 bpm    Heart Rate (Exit) 62 bpm    Rating of Perceived Exertion (Exercise)  11    Symptoms None    Comments Reviewed METs and goals    Duration Continue with 30 min of aerobic exercise without signs/symptoms of physical distress.    Intensity THRR unchanged      Progression   Progression Continue to progress workloads to maintain intensity without signs/symptoms of physical distress.    Average METs 2.3      Resistance Training   Weight 2 lbs    Reps 10-15    Time 5 Minutes      Interval Training   Interval Training No      NuStep   Level 2    SPM 91    Minutes 30    METs 2.3      Home Exercise Plan   Plans to continue exercise at [employer] (comment)    Frequency Add 3 additional days to program exercise sessions.    Initial Home Exercises Provided 09/11/24          Nutrition:  Target Goals: Understanding of nutrition guidelines, daily intake of sodium 1500mg , cholesterol 200mg , calories 30% from fat and 7% or less from saturated fats, daily to have 5 or more servings of fruits and vegetables.  Biometrics:  Pre Biometrics - 07/18/24 1345       Pre Biometrics   Waist Circumference 41.5 inches    Hip Circumference 45 inches    Waist to Hip Ratio 0.92 %    Triceps Skinfold 20 mm    % Body Fat 41.5 %    Grip Strength 11 kg    Flexibility --   not performed due to low back problems/pain   Single Leg Stand --   not performed          Nutrition Therapy Plan and Nutrition Goals:  Nutrition Therapy & Goals - 07/22/24 1337       Nutrition Therapy   Diet Heart Healthy    Drug/Food Interactions Statins/Certain Fruits      Personal Nutrition Goals   Nutrition Goal Patient to identify strategies for reducing cardiovascular risk by attending the Pritikin education and nutrition series weekly.    Personal Goal #2 Patient to improve diet quality by using the plate method as a guide for meal  planning to include lean protein/plant protein, fruits, vegetables, whole grains, nonfat dairy as part of a well-balanced diet.    Comments  Patient with medical history of hypertension, GERD, fatty liver, hypothyroidism, s/p partial thyroidectomy, bipolar, prediabetes, hyperlipidemia. 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 Pt endorses familiarity with heart healthy diet; understands importance of incorporating vegetables and limiting red meat. States behavior does not always align with diet guidelines. Largely consumes poultry, though plans to incorporate more fish. Recent lipid panel on 04/24/24 indicates LDL > 55. Patient will benefit from participation in intensive cardiac rehab for nutrition education, exercise, and lifestyle modification.      Intervention Plan   Intervention Prescribe, educate and counsel regarding individualized specific dietary modifications aiming towards targeted core components such as weight, hypertension, lipid management, diabetes, heart failure and other comorbidities.;Nutrition handout(s) given to patient.   Handouts: My Plate Planner, Pritikin Eating For a Healthy Heart   Expected Outcomes Short Term Goal: Understand basic principles of dietary content, such as calories, fat, sodium, cholesterol and nutrients.;Long Term Goal: Adherence to prescribed nutrition plan.          Nutrition Assessments:  MEDIFICTS Score Key: >=70 Need to make dietary changes  40-70 Heart Healthy Diet <= 40 Therapeutic Level Cholesterol Diet   Flowsheet Row INTENSIVE CARDIAC REHAB from 07/26/2024 in [facility]  Picture Your Plate Total Score on Admission 38   Picture Your Plate Scores: <59 Unhealthy dietary pattern with much room for improvement. 41-50 Dietary pattern unlikely to meet recommendations for good health and room for improvement. 51-60 More healthful dietary pattern, with some room for improvement.  >60 Healthy dietary  pattern, although there may be some specific behaviors that could be improved.    Nutrition Goals Re-Evaluation:  Nutrition Goals Re-Evaluation     Row Name 07/26/24 1337 08/23/24 1548 09/23/24 1437         Goals   Current Weight 181 lb 14.1 oz (82.5 kg) 184 lb 11.9 oz (83.8 kg) 182 lb 8.7 oz (82.8 kg)     Nutrition Goal Patient to identify strategies for reducing cardiovascular risk by attending the [employer] education and nutrition series weekly. Patient to identify strategies for reducing cardiovascular risk by attending the P[employer]ikin education and nutrition series weekly. Patient to identify strategies for reducing cardiovascular risk by attending the Pritikin education and nutrition series weekly.     Comment -- -- Stable wt. Recent labs on 09/12/2024 indicate elevated K of 5.4.     Expected Outcome Contemplative. Patient with medical history of hypertension, GERD, fatty liver, hypothyroidism, s/p partial thyroidectomy, bipolar, prediabetes, hyperlipidemia. Recent history of coronary artery bypass grafting x 2, left internal mammary artery graft to the LAD and right internal mammary artery graft to the RCA on 05/17/2024.Pt reports struggling to make dietary changes due to lack of energy. Initial PYP score indicates unhealthy dietary pattern. Reports often consuming Boost oral nutrition supplements.  Current diet lacking in variety of fruits/veggies. RD provided suggestions for easy-to-prep meals such as smoothies which may include plant-based foods. Patient will benefit from participation in intensive cardiac rehab for nutrition education, exercise, and lifestyle modification. Contemplative. Patient with medical history of hypertension, GERD, fatty liver, hypothyroidism, s/p partial thyroidectomy, bipolar, prediabetes, hyperlipidemia. Recent history of coronary artery bypass grafting x 2, left internal mammary artery graft to the LAD  and right internal mammary artery graft to the RCA on 05/17/2024.  No significant wt change since starting cardiac rehab. Pt continues to verbalize difficulty making dietary changes. Reports having guests in home and eating more calorie dense food such as enchiladas. Pt identifies need to increase fatty fish intake; plans to start incorporating salmon. RD also provided suggestions for increasing intake of plant-based foods such as vegetables. Patient will benefit from ongoing participation in intensive cardiac rehab for nutrition education, exercise, and lifestyle modification. Contemplative. Patient with medical history of hypertension, GERD, fatty liver, hypothyroidism, s/p partial thyroidectomy, bipolar, prediabetes, hyperlipidemia. 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 No significant wt change since starting cardiac rehab. Pt continues to verbalize difficulty making dietary changes due primarily to fatigue. Recently instructed by MD to restrict high potassium foods due to hyperkalemia. RD provided pt with list showing potassium content of foods. Discussed opting for lower potassium foods and restricting high potassium foods such as potatoes, bananas, tomatoes. Pt reports eating fast food often due to convenience. However, plans to try preparing large meals to freeze. Also considering use of slow cooker. Patient will benefit from ongoing participation in intensive cardiac rehab for nutrition education, exercise, and lifestyle modification.       Personal Goal #2 Re-Evaluation   Personal Goal #2 Patient to improve diet quality by using the plate method as a guide for meal planning to include lean protein/plant protein, fruits, vegetables, whole grains, nonfat dairy as part of a well-balanced diet. Patient to improve diet quality by using the plate method as a guide for meal planning to include lean protein/plant protein, fruits, vegetables, whole  grains, nonfat dairy as part of a well-balanced diet. Patient to improve diet quality by using the plate method as a guide for meal planning to include lean protein/plant protein, fruits, vegetables, whole grains, nonfat dairy as part of a well-balanced diet.        Nutrition Goals Re-Evaluation:  Nutrition Goals Re-Evaluation     Row Name 07/26/24 1337 08/23/24 1548 09/23/24 1437         Goals   Current Weight 181 lb 14.1 oz (82.5 kg) 184 lb 11.9 oz (83.8 kg) 182 lb 8.7 oz (82.8 kg)     Nutrition Goal Patient to identify strategies for reducing cardiovascular risk by attending the [employer] education and nutrition series weekly. Patient to identify strategies for reducing cardiovascular risk by attending the [employer] education and nutrition series weekly. Patient to identify strategies for reducing cardiovascular risk by attending the [employer]in education and nutrition series weekly.     Comment -- -- Stable wt. Recent labs on 09/12/2024 indicate elevated K of 5.4.     Expected Outcome Contemplative. Patient with medical history of hypertension, GERD, fatty liver, hypothyroidism, s/p partial thyroidectomy, bipolar, prediabetes, hyperlipidemia. Recent history of coronary artery bypass grafting x 2, left internal mammary artery graft to the LAD and right internal mammary artery graft to the RCA on 05/17/2024.Pt reports struggling to make dietary changes due to lack of energy. Initial PYP score indicates unhealthy dietary pattern. Reports often consuming Boost oral nutrition supplements.  Current diet lacking in variety of fruits/veggies. RD provided suggestions for easy-to-prep meals such as smoothies which may include plant-based foods. Patient will benefit from participation in intensive cardiac rehab for nutrition education, exercise, and lifestyle modification. Contemplative. Patient with medical history of hypertension, GERD, fatty liver,  hypothyroidism, s/p partial thyroidectomy, bipolar,  prediabetes, hyperlipidemia. 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 No significant wt change since starting cardiac rehab. Pt continues to verbalize difficulty making dietary changes. Reports having guests in home and eating more calorie dense food such as enchiladas. Pt identifies need to increase fatty fish intake; plans to start incorporating salmon. RD also provided suggestions for increasing intake of plant-based foods such as vegetables. Patient will benefit from ongoing participation in intensive cardiac rehab for nutrition education, exercise, and lifestyle modification. Contemplative. Patient with medical history of hypertension, GERD, fatty liver, hypothyroidism, s/p partial thyroidectomy, bipolar, prediabetes, hyperlipidemia. 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 No significant wt change since starting cardiac rehab. Pt continues to verbalize difficulty making dietary changes due primarily to fatigue. Recently instructed by MD to restrict high potassium foods due to hyperkalemia. RD provided pt with list showing potassium content of foods. Discussed opting for lower potassium foods and restricting high potassium foods such as potatoes, bananas, tomatoes. Pt reports eating fast food often due to convenience. However, plans to try preparing large meals to freeze. Also considering use of slow cooker. Patient will benefit from ongoing participation in intensive cardiac rehab for nutrition education, exercise, and lifestyle modification.       Personal Goal #2 Re-Evaluation   Personal Goal #2 Patient to improve diet quality by using the plate method as a guide for meal planning to include lean protein/plant protein, fruits, vegetables, whole grains,  nonfat dairy as part of a well-balanced diet. Patient to improve diet quality by using the plate method as a guide for meal planning to include lean protein/plant protein, fruits, vegetables, whole grains, nonfat dairy as part of a well-balanced diet. Patient to improve diet quality by using the plate method as a guide for meal planning to include lean protein/plant protein, fruits, vegetables, whole grains, nonfat dairy as part of a well-balanced diet.        Nutrition Goals Discharge (Final Nutrition Goals Re-Evaluation):  Nutrition Goals Re-Evaluation - 09/23/24 1437       Goals   Current Weight 182 lb 8.7 oz (82.8 kg)    Nutrition Goal Patient to identify strategies for reducing cardiovascular risk by attending the Pritikin education and nutrition series weekly.    Comment Stable wt. Recent labs on 09/12/2024 indicate elevated K of 5.4.    Expected Outcome Contemplative. Patient with medical history of hypertension, GERD, fatty liver, hypothyroidism, s/p partial thyroidectomy, bipolar, prediabetes, hyperlipidemia. 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 No significant wt change since starting cardiac rehab. Pt continues to verbalize difficulty making dietary changes due primarily to fatigue. Recently instructed by MD to restrict high potassium foods due to hyperkalemia. RD provided pt with list showing potassium content of foods. Discussed opting for lower potassium foods and restricting high potassium foods such as potatoes, bananas, tomatoes. Pt reports eating fast food often due to convenience. However, plans to try preparing large meals to freeze. Also considering use of slow cooker. Patient will benefit from ongoing participation in intensive cardiac rehab for nutrition education, exercise, and lifestyle modification.      Personal Goal #2  Re-Evaluation   Personal Goal  #2 Patient to improve diet quality by using the plate method as a guide for meal planning to include lean protein/plant protein, fruits, vegetables, whole grains, nonfat dairy as part of a well-balanced diet.          Psychosocial: Target Goals: Acknowledge presence or absence of significant depression and/or stress, maximize coping skills, provide positive support system. Participant is able to verbalize types and ability to use techniques and skills needed for reducing stress and depression.  Initial Review & Psychosocial Screening:  Initial Psych Review & Screening - 07/18/24 1502       Initial Review   Current issues with Current Stress Concerns;Current Psychotropic Meds;Current Anxiety/Panic;History of Depression    Comments Becky Lawson has a history of Bipolar disorder. Becky Lawson says that she is having anxiety about a stray cat that is living outside at her home. Becky Lawson says that she has received counsiling in the past and is not interested in receiving any counselling currently.  Becky Lawson says the medications that she is taking are working for her.      Family Dynamics   Good Support System? Yes   Averyanna lives alone. Delmi has her daughter and friends for support.     Barriers   Psychosocial barriers to participate in program The patient should benefit from training in stress management and relaxation.      Screening Interventions   Interventions Encouraged to exercise;Provide feedback about the scores to participant    Expected Outcomes Long Term Goal: Stressors or current issues are controlled or eliminated.;Short Term goal: Identification and review with participant of any Quality of Life or Depression concerns found by scoring the questionnaire.;Long Term goal: The participant improves quality of Life and PHQ9 Scores as seen by post scores and/or verbalization of changes          Quality of Life Scores:  Quality of Life - 07/18/24 1532       Quality of Life   Select Quality of Life       Quality of Life Scores   Health/Function Pre 22.36 %    Socioeconomic Pre 25 %    Psych/Spiritual Pre 26.57 %    Family Pre 27 %    GLOBAL Pre 24.42 %         Scores of 19 and below usually indicate a poorer quality of life in these areas.  A difference of  2-3 points is a clinically meaningful difference.  A difference of 2-3 points in the total score of the Quality of Life Index has been associated with significant improvement in overall quality of life, self-image, physical symptoms, and general health in studies assessing change in quality of life.  PHQ-9: Review Flowsheet  More data exists      08/14/2024 07/18/2024 03/19/2024 09/26/2023 06/26/2023  Depression screen PHQ 2/9  Decreased Interest 0 0 2 1 2   Down, Depressed, Hopeless 0 0 2 2 2   PHQ - 2 Score 0 0 4 3 4   Altered sleeping 3 0 3 1 2   Tired, decreased energy 3 3 3 3 2   Change in appetite 3 2 0 2 0  Feeling bad or failure about yourself  0 1 0 1 0  Trouble concentrating 0 0 0 0 0  Moving slowly or fidgety/restless 0 0 0 0 0  Suicidal thoughts 0 0 0 - 0  PHQ-9 Score 9 6 10  10  8    Difficult doing work/chores Very difficult Somewhat difficult Very difficult  Very difficult Very difficult    Details       Data saved with a previous flowsheet row definition        Interpretation of Total Score  Total Score Depression Severity:  1-4 = Minimal depression, 5-9 = Mild depression, 10-14 = Moderate depression, 15-19 = Moderately severe depression, 20-27 = Severe depression   Psychosocial Evaluation and Intervention:   Psychosocial Re-Evaluation:  Psychosocial Re-Evaluation     Row Name 08/06/24 1130 09/03/24 1004 10/01/24 1527         Psychosocial Re-Evaluation   Current issues with Current Stress Concerns;Current Psychotropic Meds;History of Depression;Current Anxiety/Panic Current Stress Concerns;Current Psychotropic Meds;History of Depression;Current Anxiety/Panic Current Stress Concerns;Current  Psychotropic Meds;History of Depression;Current Anxiety/Panic     Comments Becky Lawson has not voiced any increased psychosocial concerns or stressors during exercise at cardiac rehab. Becky Lawson has not voiced any increased psychosocial concerns or stressors during exercise at cardiac rehab. Becky Lawson has not voiced any increased psychosocial concerns or stressors during exercise at cardiac rehab.  Will review PHQ-9 scores in the upcoming weeks.     Expected Outcomes Majesti will have controlled/reduced stress and anxiety by completion of cardiac rehab. Jakyia will experience controlled/reduced stress and anxiety by completion of cardiac rehab. Ethylene will experience controlled/reduced stress and anxiety by completion of cardiac rehab.     Interventions Stress management education;Relaxation education;Encouraged to attend Cardiac Rehabilitation for the exercise Stress management education;Relaxation education;Encouraged to attend Cardiac Rehabilitation for the exercise Stress management education;Relaxation education;Encouraged to attend Cardiac Rehabilitation for the exercise     Continue Psychosocial Services  Follow up required by staff Follow up required by staff Follow up required by staff        Psychosocial Discharge (Final Psychosocial Re-Evaluation):  Psychosocial Re-Evaluation - 10/01/24 1527       Psychosocial Re-Evaluation   Current issues with Current Stress Concerns;Current Psychotropic Meds;History of Depression;Current Anxiety/Panic    Comments Becky Lawson has not voiced any increased psychosocial concerns or stressors during exercise at cardiac rehab.  Will review PHQ-9 scores in the upcoming weeks.    Expected Outcomes Becky Lawson will experience controlled/reduced stress and anxiety by completion of cardiac rehab.    Interventions Stress management education;Relaxation education;Encouraged to attend Cardiac Rehabilitation for the exercise    Continue Psychosocial Services  Follow up required by staff           Vocational Rehabilitation: Provide vocational rehab assistance to qualifying candidates.   Vocational Rehab Evaluation & Intervention:  Vocational Rehab - 07/18/24 1508       Initial Vocational Rehab Evaluation & Intervention   Assessment shows need for Vocational Rehabilitation No   Becky Lawson is retired and does not need vocational rehab at this time         Education: Education Goals: Education classes will be provided on a weekly basis, covering required topics. Participant will state understanding/return demonstration of topics presented.    Education     Row Name 07/22/24 1400     Education   Cardiac Education Topics Pritikin   Psychologist, Forensic Exercise Education   Exercise Education Move It!   Instruction Review Code 1- Verbalizes Understanding   Class Start Time 1412   Class Stop Time 1447   Class Time Calculation (min) 35 min    Row Name 07/24/24 1300     Education   Cardiac Education Topics Pritikin   [employer]  Sports Administrator - Meals in a Snap   Instruction Review Code 1- Verbalizes Understanding   Class Start Time 1400   Class Stop Time 1444   Class Time Calculation (min) 44 min    Row Name 07/26/24 1200     Education   Cardiac Education Topics Pritikin   Geographical Information Systems Officer Exercise   Exercise Workshop Exercise Basics: Diplomatic Services Operational Officer   Instruction Review Code 1- Verbalizes Understanding   Class Start Time 1400   Class Stop Time 1452   Class Time Calculation (min) 52 min    Row Name 07/29/24 1300     Education   Cardiac Education Topics Pritikin   Glass Blower/designer Nutrition   Nutrition Workshop Targeting Your Nutrition Priorities   Instruction Review Code 1- Verbalizes Understanding   Class Start  Time 1400   Class Stop Time 1445   Class Time Calculation (min) 45 min    Row Name 07/31/24 1300     Education   Cardiac Education Topics Pritikin   Customer Service Manager   Weekly Topic One-Pot Wonders   Instruction Review Code 1- Verbalizes Understanding   Class Start Time 1400   Class Stop Time 1445   Class Time Calculation (min) 45 min    Row Name 08/05/24 1200     Education   Cardiac Education Topics Pritikin   Immunologist Exercise Physiologist   Select Psychosocial   Psychosocial Workshop Focused Goals, Sustainable Changes   Instruction Review Code 1- Verbalizes Understanding   Class Start Time 1355   Class Stop Time 1438   Class Time Calculation (min) 43 min    Row Name 08/09/24 1200     Education   Cardiac Education Topics Pritikin   Licensed Conveyancer Nutrition   Nutrition Dining Out - Part 1   Instruction Review Code 1- Verbalizes Understanding    Row Name 08/09/24 1400     Education   Cardiac Education Topics Pritikin   Licensed Conveyancer Nutrition   Nutrition Dining Out - Part 1   Instruction Review Code 1- Verbalizes Understanding   Class Start Time 1400   Class Stop Time 1440   Class Time Calculation (min) 40 min    Row Name 08/16/24 1300     Education   Cardiac Education Topics Pritikin   Licensed Conveyancer Nutrition   Nutrition Vitamins and Minerals   Instruction Review Code [place] Understanding    Row Name 08/23/24 1300     Education   Cardiac Education Topics Pritikin   Select Workshops     Workshops   Educator Dietitian   Select Nutrition   Nutrition Workshop Fueling a Forensic Psychologist   Instruction Review Code 1- Tax Inspector   Class Start Time 1400   Class Stop Time 1455   Class Time  Calculation (min) 55 min    Row Name 08/28/24 1200     Education   Cardiac Education Topics --   Select --  Hospital Doctor --   Weekly Topic --   Instruction Review Code --    Row Name 09/02/24 1500     Education   Cardiac Education Topics Pritikin   Psychologist, Forensic Psychosocial   Psychosocial How Our Thoughts Can Heal Our Hearts   Instruction Review Code 1- Verbalizes Understanding   Class Start Time 1400   Class Stop Time 1450   Class Time Calculation (min) 50 min    Row Name 09/11/24 1200     Education   Cardiac Education Topics Pritikin   Customer Service Manager   Weekly Topic Tasty Appetizers and Snacks   Instruction Review Code 1- Verbalizes Understanding   Class Start Time 1400   Class Stop Time 1439   Class Time Calculation (min) 39 min    Row Name 09/13/24 1400     Education   Cardiac Education Topics Pritikin   Licensed Conveyancer Nutrition   Nutrition Other   Instruction Review Code 1- Verbalizes Understanding   Class Start Time 1400   Class Stop Time 1440   Class Time Calculation (min) 40 min    Row Name 09/16/24 1400     Education   Cardiac Education Topics Pritikin   Glass Blower/designer Nutrition   Nutrition Workshop Label Reading   Instruction Review Code 1- Tax Inspector   Class Start Time 1400   Class Stop Time 1445   Class Time Calculation (min) 45 min    Row Name 09/18/24 1200     Education   Cardiac Education Topics Pritikin   Customer Service Manager   Weekly Topic Adding Flavor - Sodium-Free   Instruction Review Code 1- Verbalizes Understanding   Class Start Time 1401   Class Stop Time 1427   Class Time Calculation (min) 26 min    Row Name 09/23/24 1300      Education   Cardiac Education Topics --   Select --     Core Videos   Educator --   Select --   General Education --   Instruction Review Code --   Class Start Time --   Class Stop Time --   Class Time Calculation (min) --    Row Name 09/25/24 1300     Education   Cardiac Education Topics --   Select --     Hospital Doctor --   Weekly Topic --   Instruction Review Code --    Row Name 09/27/24 1400     Education   Cardiac Education Topics Pritikin   Select Workshops     Workshops   Educator Exercise Physiologist   Select Exercise   Exercise Workshop Location Manager and Fall Prevention   Instruction Review Code 1- Teaching Laboratory Technician Start Time 1420   Class Stop Time 1500   Class Time Calculation (min) 40 min      Core Videos: Exercise    Move It!  Clinical staff conducted group or individual video education with verbal and written material and guidebook.  Patient learns the recommended Pritikin exercise program. Exercise with the goal of living a long, healthy life. Some  of the health benefits of exercise include controlled diabetes, healthier blood pressure levels, improved cholesterol levels, improved heart and lung capacity, improved sleep, and better body composition. Everyone should speak with their doctor before starting or changing an exercise routine.  Biomechanical Limitations Clinical staff conducted group or individual video education with verbal and written material and guidebook.  Patient learns how biomechanical limitations can impact exercise and how we can mitigate and possibly overcome limitations to have an impactful and balanced exercise routine.  Body Composition Clinical staff conducted group or individual video education with verbal and written material and guidebook.  Patient learns that body composition (ratio of muscle mass to fat mass) is a key component to assessing overall fitness, rather than body weight alone.  Increased fat mass, especially visceral belly fat, can put us  at increased risk for metabolic syndrome, type 2 diabetes, heart disease, and even death. It is recommended to combine diet and exercise (cardiovascular and resistance training) to improve your body composition. Seek guidance from your physician and exercise physiologist before implementing an exercise routine.  Exercise Action Plan Clinical staff conducted group or individual video education with verbal and written material and guidebook.  Patient learns the recommended strategies to achieve and enjoy long-term exercise adherence, including variety, self-motivation, self-efficacy, and positive decision making. Benefits of exercise include fitness, good health, weight management, more energy, better sleep, less stress, and overall well-being.  Medical   Heart Disease Risk Reduction Clinical staff conducted group or individual video education with verbal and written material and guidebook.  Patient learns our heart is our most vital organ as it circulates oxygen, nutrients, white blood cells, and hormones throughout the entire body, and carries waste away. Data supports a plant-based eating plan like the Pritikin Program for its effectiveness in slowing progression of and reversing heart disease. The video provides a number of recommendations to address heart disease.   Metabolic Syndrome and Belly Fat  Clinical staff conducted group or individual video education with verbal and written material and guidebook.  Patient learns what metabolic syndrome is, how it leads to heart disease, and how one can reverse it and keep it from coming back. You have metabolic syndrome if you have 3 of the following 5 criteria: abdominal obesity, high blood pressure, high triglycerides, low HDL cholesterol, and high blood sugar.  Hypertension and Heart Disease Clinical staff conducted group or individual video education with verbal and written material and  guidebook.  Patient learns that high blood pressure, or hypertension, is very common in the [place] . Hypertension is largely due to excessive salt intake, but other important risk factors include being overweight, physical inactivity, drinking too much alcohol, smoking, and not eating enough potassium from fruits and vegetables. High blood pressure is a leading risk factor for heart attack, stroke, congestive heart failure, dementia, kidney failure, and premature death. Long-term effects of excessive salt intake include stiffening of the arteries and thickening of heart muscle and organ damage. Recommendations include ways to reduce hypertension and the risk of heart disease.  Diseases of Our Time - Focusing on Diabetes Clinical staff conducted group or individual video education with verbal and written material and guidebook.  Patient learns why the best way to stop diseases of our time is prevention, through food and other lifestyle changes. Medicine (such as prescription pills and surgeries) is often only a Band-Aid on the problem, not a long-term solution. Most common diseases of our time include obesity, type 2 diabetes, hypertension, heart disease, and cancer.  The Pritikin Program is recommended and has been proven to help reduce, reverse, and/or prevent the damaging effects of metabolic syndrome.  Nutrition   Overview of the Pritikin Eating Plan  Clinical staff conducted group or individual video education with verbal and written material and guidebook.  Patient learns about the Pritikin Eating Plan for disease risk reduction. The Pritikin Eating Plan emphasizes a wide variety of unrefined, minimally-processed carbohydrates, like fruits, vegetables, whole grains, and legumes. Go, Caution, and Stop food choices are explained. Plant-based and lean animal proteins are emphasized. Rationale provided for low sodium intake for blood pressure control, low added sugars for blood sugar stabilization,  and low added fats and oils for coronary artery disease risk reduction and weight management.  Calorie Density  Clinical staff conducted group or individual video education with verbal and written material and guidebook.  Patient learns about calorie density and how it impacts the Pritikin Eating Plan. Knowing the characteristics of the food you choose will help you decide whether those foods will lead to weight gain or weight loss, and whether you want to consume more or less of them. Weight loss is usually a side effect of the Pritikin Eating Plan because of its focus on low calorie-dense foods.  Label Reading  Clinical staff conducted group or individual video education with verbal and written material and guidebook.  Patient learns about the Pritikin recommended label reading guidelines and corresponding recommendations regarding calorie density, added sugars, sodium content, and whole grains.  Dining Out - Part 1  Clinical staff conducted group or individual video education with verbal and written material and guidebook.  Patient learns that restaurant meals can be sabotaging because they can be so high in calories, fat, sodium, and/or sugar. Patient learns recommended strategies on how to positively address this and avoid unhealthy pitfalls.  Facts on Fats  Clinical staff conducted group or individual video education with verbal and written material and guidebook.  Patient learns that lifestyle modifications can be just as effective, if not more so, as many medications for lowering your risk of heart disease. A Pritikin lifestyle can help to reduce your risk of inflammation and atherosclerosis (cholesterol build-up, or plaque, in the artery walls). Lifestyle interventions such as dietary choices and physical activity address the cause of atherosclerosis. A review of the types of fats and their impact on blood cholesterol levels, along with dietary recommendations to reduce fat intake is also  included.  Nutrition Action Plan  Clinical staff conducted group or individual video education with verbal and written material and guidebook.  Patient learns how to incorporate Pritikin recommendations into their lifestyle. Recommendations include planning and keeping personal health goals in mind as an important part of their success.  Healthy Mind-Set    Healthy Minds, Bodies, Hearts  Clinical staff conducted group or individual video education with verbal and written material and guidebook.  Patient learns how to identify when they are stressed. Video will discuss the impact of that stress, as well as the many benefits of stress management. Patient will also be introduced to stress management techniques. The way we think, act, and feel has an impact on our hearts.  How Our Thoughts Can Heal Our Hearts  Clinical staff conducted group or individual video education with verbal and written material and guidebook.  Patient learns that negative thoughts can cause depression and anxiety. This can result in negative lifestyle behavior and serious health problems. Cognitive behavioral therapy is an effective method to help control our thoughts in  order to change and improve our emotional outlook.  Additional Videos:  Exercise    Improving Performance  Clinical staff conducted group or individual video education with verbal and written material and guidebook.  Patient learns to use a non-linear approach by alternating intensity levels and lengths of time spent exercising to help burn more calories and lose more body fat. Cardiovascular exercise helps improve heart health, metabolism, hormonal balance, blood sugar control, and recovery from fatigue. Resistance training improves strength, endurance, balance, coordination, reaction time, metabolism, and muscle mass. Flexibility exercise improves circulation, posture, and balance. Seek guidance from your physician and exercise physiologist before  implementing an exercise routine and learn your capabilities and proper form for all exercise.  Introduction to Yoga  Clinical staff conducted group or individual video education with verbal and written material and guidebook.  Patient learns about yoga, a discipline of the coming together of mind, breath, and body. The benefits of yoga include improved flexibility, improved range of motion, better posture and core strength, increased lung function, weight loss, and positive self-image. Yogas heart health benefits include lowered blood pressure, healthier heart rate, decreased cholesterol and triglyceride levels, improved immune function, and reduced stress. Seek guidance from your physician and exercise physiologist before implementing an exercise routine and learn your capabilities and proper form for all exercise.  Medical   Aging: Enhancing Your Quality of Life  Clinical staff conducted group or individual video education with verbal and written material and guidebook.  Patient learns key strategies and recommendations to stay in good physical health and enhance quality of life, such as prevention strategies, having an advocate, securing a Health Care Proxy and Power of Attorney, and keeping a list of medications and system for tracking them. It also discusses how to avoid risk for bone loss.  Biology of Weight Control  Clinical staff conducted group or individual video education with verbal and written material and guidebook.  Patient learns that weight gain occurs because we consume more calories than we burn (eating more, moving less). Even if your body weight is normal, you may have higher ratios of fat compared to muscle mass. Too much body fat puts you at increased risk for cardiovascular disease, heart attack, stroke, type 2 diabetes, and obesity-related cancers. In addition to exercise, following the Pritikin Eating Plan can help reduce your risk.  Decoding Lab Results  Clinical staff  conducted group or individual video education with verbal and written material and guidebook.  Patient learns that lab test reflects one measurement whose values change over time and are influenced by many factors, including medication, stress, sleep, exercise, food, hydration, pre-existing medical conditions, and more. It is recommended to use the knowledge from this video to become more involved with your lab results and evaluate your numbers to speak with your doctor.   Diseases of Our Time - Overview  Clinical staff conducted group or individual video education with verbal and written material and guidebook.  Patient learns that according to the CDC, 50% to 70% of chronic diseases (such as obesity, type 2 diabetes, elevated lipids, hypertension, and heart disease) are avoidable through lifestyle improvements including healthier food choices, listening to satiety cues, and increased physical activity.  Sleep Disorders Clinical staff conducted group or individual video education with verbal and written material and guidebook.  Patient learns how good quality and duration of sleep are important to overall health and well-being. Patient also learns about sleep disorders and how they impact health along with recommendations to address them,  including discussing with a physician.  Nutrition  Dining Out - Part 2 Clinical staff conducted group or individual video education with verbal and written material and guidebook.  Patient learns how to plan ahead and communicate in order to maximize their dining experience in a healthy and nutritious manner. Included are recommended food choices based on the type of restaurant the patient is visiting.   Fueling a Banker conducted group or individual video education with verbal and written material and guidebook.  There is a strong connection between our food choices and our health. Diseases like obesity and type 2 diabetes are very  prevalent and are in large-part due to lifestyle choices. The Pritikin Eating Plan provides plenty of food and hunger-curbing satisfaction. It is easy to follow, affordable, and helps reduce health risks.  Menu Workshop  Clinical staff conducted group or individual video education with verbal and written material and guidebook.  Patient learns that restaurant meals can sabotage health goals because they are often packed with calories, fat, sodium, and sugar. Recommendations include strategies to plan ahead and to communicate with the manager, chef, or server to help order a healthier meal.  Planning Your Eating Strategy  Clinical staff conducted group or individual video education with verbal and written material and guidebook.  Patient learns about the Pritikin Eating Plan and its benefit of reducing the risk of disease. The Pritikin Eating Plan does not focus on calories. Instead, it emphasizes high-quality, nutrient-rich foods. By knowing the characteristics of the foods, we choose, we can determine their calorie density and make informed decisions.  Targeting Your Nutrition Priorities  Clinical staff conducted group or individual video education with verbal and written material and guidebook.  Patient learns that lifestyle habits have a tremendous impact on disease risk and progression. This video provides eating and physical activity recommendations based on your personal health goals, such as reducing LDL cholesterol, losing weight, preventing or controlling type 2 diabetes, and reducing high blood pressure.  Vitamins and Minerals  Clinical staff conducted group or individual video education with verbal and written material and guidebook.  Patient learns different ways to obtain key vitamins and minerals, including through a recommended healthy diet. It is important to discuss all supplements you take with your doctor.   Healthy Mind-Set    Smoking Cessation  Clinical staff conducted group  or individual video education with verbal and written material and guidebook.  Patient learns that cigarette smoking and tobacco addiction pose a serious health risk which affects millions of people. Stopping smoking will significantly reduce the risk of heart disease, lung disease, and many forms of cancer. Recommended strategies for quitting are covered, including working with your doctor to develop a successful plan.  Culinary   Becoming a Set Designer conducted group or individual video education with verbal and written material and guidebook.  Patient learns that cooking at home can be healthy, cost-effective, quick, and puts them in control. Keys to cooking healthy recipes will include looking at your recipe, assessing your equipment needs, planning ahead, making it simple, choosing cost-effective seasonal ingredients, and limiting the use of added fats, salts, and sugars.  Cooking - Breakfast and Snacks  Clinical staff conducted group or individual video education with verbal and written material and guidebook.  Patient learns how important breakfast is to satiety and nutrition through the entire day. Recommendations include key foods to eat during breakfast to help stabilize blood sugar levels and to prevent overeating at  meals later in the day. Planning ahead is also a key component.  Cooking - Educational Psychologist conducted group or individual video education with verbal and written material and guidebook.  Patient learns eating strategies to improve overall health, including an approach to cook more at home. Recommendations include thinking of animal protein as a side on your plate rather than center stage and focusing instead on lower calorie dense options like vegetables, fruits, whole grains, and plant-based proteins, such as beans. Making sauces in large quantities to freeze for later and leaving the skin on your vegetables are also recommended to maximize  your experience.  Cooking - Healthy Salads and Dressing Clinical staff conducted group or individual video education with verbal and written material and guidebook.  Patient learns that vegetables, fruits, whole grains, and legumes are the foundations of the Pritikin Eating Plan. Recommendations include how to incorporate each of these in flavorful and healthy salads, and how to create homemade salad dressings. Proper handling of ingredients is also covered. Cooking - Soups and [employer] - Soups and Desserts Clinical staff conducted group or individual video education with verbal and written material and guidebook.  Patient learns that Pritikin soups and desserts make for easy, nutritious, and delicious snacks and meal components that are low in sodium, fat, sugar, and calorie density, while high in vitamins, minerals, and filling fiber. Recommendations include simple and healthy ideas for soups and desserts.   Overview     The Pritikin Solution Program Overview Clinical staff conducted group or individual video education with verbal and written material and guidebook.  Patient learns that the results of the Pritikin Program have been documented in more than 100 articles published in peer-reviewed journals, and the benefits include reducing risk factors for (and, in some cases, even reversing) high cholesterol, high blood pressure, type 2 diabetes, obesity, and more! An overview of the three key pillars of the Pritikin Program will be covered: eating well, doing regular exercise, and having a healthy mind-set.  WORKSHOPS  Exercise: Exercise Basics: Building Your Action Plan Clinical staff led group instruction and group discussion with PowerPoint presentation and patient guidebook. To enhance the learning environment the use of posters, models and videos may be added. At the conclusion of this workshop, patients will comprehend the difference between physical activity and exercise, as well  as the benefits of incorporating both, into their routine. Patients will understand the FITT (Frequency, Intensity, Time, and Type) principle and how to use it to build an exercise action plan. In addition, safety concerns and other considerations for exercise and cardiac rehab will be addressed by the presenter. The purpose of this lesson is to promote a comprehensive and effective weekly exercise routine in order to improve patients overall level of fitness.   Managing Heart Disease: Your Path to a Healthier Heart Clinical staff led group instruction and group discussion with PowerPoint presentation and patient guidebook. To enhance the learning environment the use of posters, models and videos may be added.At the conclusion of this workshop, patients will understand the anatomy and physiology of the heart. Additionally, they will understand how Pritikins three pillars impact the risk factors, the progression, and the management of heart disease.  The purpose of this lesson is to provide a high-level overview of the heart, heart disease, and how the Pritikin lifestyle positively impacts risk factors.  Exercise Biomechanics Clinical staff led group instruction and group discussion with PowerPoint presentation and patient guidebook. To enhance the learning  environment the use of posters, models and videos may be added. Patients will learn how the structural parts of their bodies function and how these functions impact their daily activities, movement, and exercise. Patients will learn how to promote a neutral spine, learn how to manage pain, and identify ways to improve their physical movement in order to promote healthy living. The purpose of this lesson is to expose patients to common physical limitations that impact physical activity. Participants will learn practical ways to adapt and manage aches and pains, and to minimize their effect on regular exercise. Patients will learn how to  maintain good posture while sitting, walking, and lifting.  Balance Training and Fall Prevention  Clinical staff led group instruction and group discussion with PowerPoint presentation and patient guidebook. To enhance the learning environment the use of posters, models and videos may be added. At the conclusion of this workshop, patients will understand the importance of their sensorimotor skills (vision, proprioception, and the vestibular system) in maintaining their ability to balance as they age. Patients will apply a variety of balancing exercises that are appropriate for their current level of function. Patients will understand the common causes for poor balance, possible solutions to these problems, and ways to modify their physical environment in order to minimize their fall risk. The purpose of this lesson is to teach patients about the importance of maintaining balance as they age and ways to minimize their risk of falling.  WORKSHOPS   Nutrition:  Fueling a Ship Broker led group instruction and group discussion with PowerPoint presentation and patient guidebook. To enhance the learning environment the use of posters, models and videos may be added. Patients will review the foundational principles of the Pritikin Eating Plan and understand what constitutes a serving size in each of the food groups. Patients will also learn Pritikin-friendly foods that are better choices when away from home and review make-ahead meal and snack options. Calorie density will be reviewed and applied to three nutrition priorities: weight maintenance, weight loss, and weight gain. The purpose of this lesson is to reinforce (in a group setting) the key concepts around what patients are recommended to eat and how to apply these guidelines when away from home by planning and selecting Pritikin-friendly options. Patients will understand how calorie density may be adjusted for different weight management  goals.  Mindful Eating  Clinical staff led group instruction and group discussion with PowerPoint presentation and patient guidebook. To enhance the learning environment the use of posters, models and videos may be added. Patients will briefly review the concepts of the Pritikin Eating Plan and the importance of low-calorie dense foods. The concept of mindful eating will be introduced as well as the importance of paying attention to internal hunger signals. Triggers for non-hunger eating and techniques for dealing with triggers will be explored. The purpose of this lesson is to provide patients with the opportunity to review the basic principles of the Pritikin Eating Plan, discuss the value of eating mindfully and how to measure internal cues of hunger and fullness using the Hunger Scale. Patients will also discuss reasons for non-hunger eating and learn strategies to use for controlling emotional eating.  Targeting Your Nutrition Priorities Clinical staff led group instruction and group discussion with PowerPoint presentation and patient guidebook. To enhance the learning environment the use of posters, models and videos may be added. Patients will learn how to determine their genetic susceptibility to disease by reviewing their family history. Patients will gain  insight into the importance of diet as part of an overall healthy lifestyle in mitigating the impact of genetics and other environmental insults. The purpose of this lesson is to provide patients with the opportunity to assess their personal nutrition priorities by looking at their family history, their own health history and current risk factors. Patients will also be able to discuss ways of prioritizing and modifying the Pritikin Eating Plan for their highest risk areas  Menu  Clinical staff led group instruction and group discussion with PowerPoint presentation and patient guidebook. To enhance the learning environment the use of posters,  models and videos may be added. Using menus brought in from [employer], or printed from [employer], patients will apply the Pritikin dining out guidelines that were presented in the [employer] video. Patients will also be able to practice these guidelines in a variety of provided scenarios. The purpose of this lesson is to provide patients with the opportunity to practice hands-on learning of the Pritikin Dining Out guidelines with actual menus and practice scenarios.  Label Reading Clinical staff led group instruction and group discussion with PowerPoint presentation and patient guidebook. To enhance the learning environment the use of posters, models and videos may be added. Patients will review and discuss the Pritikin label reading guidelines presented in Pritikins Label Reading Educational series video. Using fool labels brought in from local grocery stores and markets, patients will apply the label reading guidelines and determine if the packaged food meet the Pritikin guidelines. The purpose of this lesson is to provide patients with the opportunity to review, discuss, and practice hands-on learning of the Pritikin Label Reading guidelines with actual packaged food labels. Cooking School  Pritikins [employer] are designed to teach patients ways to prepare quick, simple, and affordable recipes at home. The importance of nutritions role in chronic disease risk reduction is reflected in its emphasis in the overall Pritikin program. By learning how to prepare essential core Pritikin Eating Plan recipes, patients will increase control over what they eat; be able to customize the flavor of foods without the use of added salt, sugar, or fat; and improve the quality of the food they consume. By learning a set of core recipes which are easily assembled, quickly prepared, and affordable, patients are more likely to prepare more healthy foods at home. These workshops  focus on convenient breakfasts, simple entres, side dishes, and desserts which can be prepared with minimal effort and are consistent with nutrition recommendations for cardiovascular risk reduction. Cooking [employer] are taught by a armed forces logistics/support/administrative officer (RD) who has been trained by the [employer]. The chef or RD has a clear understanding of the importance of minimizing - if not completely eliminating - added fat, sugar, and sodium in recipes. Throughout the series of Cooking School Workshop sessions, patients will learn about healthy ingredients and efficient methods of cooking to build confidence in their capability to prepare    Cooking School weekly topics:  Adding Flavor- Sodium-Free  Fast and Healthy Breakfasts  Powerhouse Plant-Based Proteins  Satisfying Salads and Dressings  Simple Sides and Sauces  International Cuisine-Spotlight on the [employer] Zones  Delicious Desserts  Savory Soups  [employer] - Meals in a Astronomer Appetizers and Snacks  Comforting Weekend Breakfasts  One-Pot Wonders   Fast Evening Meals  Landscape Architect Your Pritikin Plate  WORKSHOPS   Healthy Mindset (Psychosocial):  Focused Goals, Sustainable Changes Clinical staff led group  instruction and group discussion with PowerPoint presentation and patient guidebook. To enhance the learning environment the use of posters, models and videos may be added. Patients will be able to apply effective goal setting strategies to establish at least one personal goal, and then take consistent, meaningful action toward that goal. They will learn to identify common barriers to achieving personal goals and develop strategies to overcome them. Patients will also gain an understanding of how our mind-set can impact our ability to achieve goals and the importance of cultivating a positive and growth-oriented mind-set. The purpose of this lesson is to provide patients with a deeper  understanding of how to set and achieve personal goals, as well as the tools and strategies needed to overcome common obstacles which may arise along the way.  From Head to Heart: The Power of a Healthy Outlook  Clinical staff led group instruction and group discussion with PowerPoint presentation and patient guidebook. To enhance the learning environment the use of posters, models and videos may be added. Patients will be able to recognize and describe the impact of emotions and mood on physical health. They will discover the importance of self-care and explore self-care practices which may work for them. Patients will also learn how to utilize the 4 Cs to cultivate a healthier outlook and better manage stress and challenges. The purpose of this lesson is to demonstrate to patients how a healthy outlook is an essential part of maintaining good health, especially as they continue their cardiac rehab journey.  Healthy Sleep for a Healthy Heart Clinical staff led group instruction and group discussion with PowerPoint presentation and patient guidebook. To enhance the learning environment the use of posters, models and videos may be added. At the conclusion of this workshop, patients will be able to demonstrate knowledge of the importance of sleep to overall health, well-being, and quality of life. They will understand the symptoms of, and treatments for, common sleep disorders. Patients will also be able to identify daytime and nighttime behaviors which impact sleep, and they will be able to apply these tools to help manage sleep-related challenges. The purpose of this lesson is to provide patients with a general overview of sleep and outline the importance of quality sleep. Patients will learn about a few of the most common sleep disorders. Patients will also be introduced to the concept of sleep hygiene, and discover ways to self-manage certain sleeping problems through simple daily behavior changes.  Finally, the workshop will motivate patients by clarifying the links between quality sleep and their goals of heart-healthy living.   Recognizing and Reducing Stress Clinical staff led group instruction and group discussion with PowerPoint presentation and patient guidebook. To enhance the learning environment the use of posters, models and videos may be added. At the conclusion of this workshop, patients will be able to understand the types of stress reactions, differentiate between acute and chronic stress, and recognize the impact that chronic stress has on their health. They will also be able to apply different coping mechanisms, such as reframing negative self-talk. Patients will have the opportunity to practice a variety of stress management techniques, such as deep abdominal breathing, progressive muscle relaxation, and/or guided imagery.  The purpose of this lesson is to educate patients on the role of stress in their lives and to provide healthy techniques for coping with it.  Learning Barriers/Preferences:  Learning Barriers/Preferences - 07/18/24 1532       Learning Barriers/Preferences   Learning Barriers Sight  Learning Preferences Audio;Computer/Internet;Group Instruction;Individual Instruction;Pictoral;Skilled Demonstration;Verbal Instruction;Video;Written Material          Education Topics:  Knowledge Questionnaire Score:  Knowledge Questionnaire Score - 07/18/24 1534       Knowledge Questionnaire Score   Pre Score 21/24          Core Components/Risk Factors/Patient Goals at Admission:  Personal Goals and Risk Factors at Admission - 07/18/24 1534       Core Components/Risk Factors/Patient Goals on Admission   Hypertension Yes    Intervention Provide education on lifestyle modifcations including regular physical activity/exercise, weight management, moderate sodium restriction and increased consumption of fresh fruit, vegetables, and low fat dairy, alcohol  moderation, and smoking cessation.;Monitor prescription use compliance.    Expected Outcomes Short Term: Continued assessment and intervention until BP is < 140/27mm HG in hypertensive participants. < 130/103mm HG in hypertensive participants with diabetes, heart failure or chronic kidney disease.;Long Term: Maintenance of blood pressure at goal levels.    Lipids Yes    Intervention Provide education and support for participant on nutrition & aerobic/resistive exercise along with prescribed medications to achieve LDL 70mg , HDL >40mg .    Expected Outcomes Short Term: Participant states understanding of desired cholesterol values and is compliant with medications prescribed. Participant is following exercise prescription and nutrition guidelines.;Long Term: Cholesterol controlled with medications as prescribed, with individualized exercise RX and with personalized nutrition plan. Value goals: LDL < 70mg , HDL > 40 mg.    Stress Yes    Intervention Offer individual and/or small group education and counseling on adjustment to heart disease, stress management and health-related lifestyle change. Teach and support self-help strategies.;Refer participants experiencing significant psychosocial distress to appropriate mental health specialists for further evaluation and treatment. When possible, include family members and significant others in education/counseling sessions.    Expected Outcomes Short Term: Participant demonstrates changes in health-related behavior, relaxation and other stress management skills, ability to obtain effective social support, and compliance with psychotropic medications if prescribed.;Long Term: Emotional wellbeing is indicated by absence of clinically significant psychosocial distress or social isolation.          Core Components/Risk Factors/Patient Goals Review:   Goals and Risk Factor Review     Row Name 08/06/24 1133 09/03/24 1005 10/01/24 1529         Core  Components/Risk Factors/Patient Goals Review   Personal Goals Review Hypertension;Lipids;Stress Hypertension;Lipids;Stress Hypertension;Lipids;Stress     Review Becky Lawson is doing well with exercise at cardiac rehab. Vital signs stable. Becky Lawson has already begun to increased her MET levels in her first seven rehab sessions. Becky Lawson is doing well with exercise at cardiac rehab. VSS. Becky Lawson has maintained her MET levels over the last 30 days. Becky Lawson is doing well with exercise at cardiac rehab. VSS. Becky Lawson has maintained her MET levels over the last 30 days.     Expected Outcomes Zakyia will continue to particpate in cardiac rehab for exercise nutriton and lifestyle modifications. Yomaris will continue to particpate in cardiac rehab for exercise nutrition and lifestyle modifications. Mikiya will continue to particpate in cardiac rehab for exercise nutrition and lifestyle modifications.        Core Components/Risk Factors/Patient Goals at Discharge (Final Review):   Goals and Risk Factor Review - 10/01/24 1529       Core Components/Risk Factors/Patient Goals Review   Personal Goals Review Hypertension;Lipids;Stress    Review Becky Lawson is doing well with exercise at cardiac rehab. VSS. Becky Lawson has maintained her MET levels over the last 30 days.  Expected Outcomes Becky Lawson will continue to particpate in cardiac rehab for exercise nutrition and lifestyle modifications.          ITP Comments:  ITP Comments     Row Name 07/18/24 1402 07/22/24 1405 08/06/24 1025 09/03/24 1003 10/01/24 1526   ITP Comments Dr Wilbert Bihari MD, Medical Director. [facility]/ Intensive Cardiac Rehab. Initial Orientation Packet Reviewed with the patient. 30 Day ITP Review. Jaylissa started cardiac rehab on 07/22/24 and tolerated exercising well. 30 Day ITP Review. Cieanna started cardiac rehab on 07/22/24, is tolerating exercise well, and is off to a good start. 30 Day ITP Review. Becky Lawson is tolerating exercise well since she started  cardiac rehab (07/22/24) 30 Day ITP Review. Becky Lawson continues to tolerate exercise well since starting cardiac rehab on 07/22/24      Comments: see ITP comments    [1]  Current Outpatient Medications:    ALPRAZolam  (XANAX ) 1 MG tablet, TAKE 1/2 TO 1 TABLET BY MOUTH twice daily AS NEEDED FOR ANXIETY, Disp: 30 tablet, Rfl: 0   amLODipine  (NORVASC ) 5 MG tablet, Take 1 tablet (5 mg total) by mouth daily., Disp: 90 tablet, Rfl: 3   aspirin  EC 81 MG tablet, Take 1 tablet (81 mg total) by mouth daily. Swallow whole., Disp: 30 tablet, Rfl: 12   atorvastatin  (LIPITOR ) 80 MG tablet, Take 1 tablet (80 mg total) by mouth daily., Disp: 90 tablet, Rfl: 3   clopidogrel  (PLAVIX ) 75 MG tablet, Take 1 tablet (75 mg total) by mouth daily with breakfast., Disp: 90 tablet, Rfl: 3   docusate sodium  (COLACE) 100 MG capsule, Take 1 capsule (100 mg total) by mouth 2 (two) times daily., Disp: 10 capsule, Rfl: 0   ezetimibe  (ZETIA ) 10 MG tablet, Take 1 tablet (10 mg total) by mouth daily., Disp: 90 tablet, Rfl: 3   famotidine  (PEPCID ) 20 MG tablet, TAKE 1 TABLET BY MOUTH DAILY, Disp: 90 tablet, Rfl: 0   Fe Fum-Vit C-Vit B12-FA (TRIGELS-F FORTE) CAPS capsule, Take 1 capsule by mouth daily after breakfast. (Patient not taking: Reported on 09/12/2024), Disp: , Rfl:    lamoTRIgine  (LAMICTAL ) 100 MG tablet, TAKE TWO TABLETS BY MOUTH EVERY MORNING and TAKE ONE TABLET BY MOUTH EVERY EVENING, Disp: 270 tablet, Rfl: 1   levothyroxine  (SYNTHROID ) 100 MCG tablet, TAKE 1 TABLET BY MOUTH DAILY BEFORE BREAKFAST, Disp: 90 tablet, Rfl: 1   metoprolol  tartrate (LOPRESSOR ) 25 MG tablet, Take 1 tablet (25 mg total) by mouth 2 (two) times daily., Disp: 180 tablet, Rfl: 3   oxyCODONE -acetaminophen  (PERCOCET) 10-325 MG tablet, Take 1 tablet by mouth every 6 (six) hours as needed for pain., Disp: 28 tablet, Rfl: 0   polyethylene glycol (MIRALAX  / GLYCOLAX ) 17 g packet, Take 17 g by mouth daily as needed for mild constipation., Disp: 14 each, Rfl:  0   QUEtiapine  (SEROQUEL ) 50 MG tablet, Take 1 tablet (50 mg total) by mouth daily., Disp: 90 tablet, Rfl: 1   traZODone  (DESYREL ) 150 MG tablet, TAKE TWO TABLETS BY MOUTH everyday AT bedtime, Disp: 180 tablet, Rfl: 1   Vitamin D , Ergocalciferol , (DRISDOL ) 1.25 MG (50000 UNIT) CAPS capsule, Take 1 capsule (50,000 Units total) by mouth every Sunday., Disp: 12 capsule, Rfl: 3 [2]  Social History Tobacco Use  Smoking Status Former   Current packs/day: 0.00   Average packs/day: 1 pack/day for 15.0 years (15.0 ttl pk-yrs)   Types: Cigarettes   Start date: 06/29/1964   Quit date: 06/30/1979   Years since quitting: 45.2  Smokeless  Tobacco Never

## 2024-10-02 ENCOUNTER — Encounter (HOSPITAL_COMMUNITY)

## 2024-10-04 ENCOUNTER — Telehealth (HOSPITAL_COMMUNITY): Payer: Self-pay

## 2024-10-04 ENCOUNTER — Encounter (HOSPITAL_COMMUNITY): Admission: RE | Admit: 2024-10-04
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
## 2024-10-07 ENCOUNTER — Encounter (HOSPITAL_COMMUNITY)

## 2024-10-07 LAB — BASIC METABOLIC PANEL WITH GFR
BUN/Creatinine Ratio: 11 — ABNORMAL LOW (ref 12–28)
BUN: 10 mg/dL (ref 8–27)
CO2: 25 mmol/L (ref 20–29)
Calcium: 9.5 mg/dL (ref 8.7–10.3)
Chloride: 100 mmol/L (ref 96–106)
Creatinine, Ser: 0.94 mg/dL (ref 0.57–1.00)
Glucose: 97 mg/dL (ref 70–99)
Potassium: 5.1 mmol/L (ref 3.5–5.2)
Sodium: 139 mmol/L (ref 134–144)
eGFR: 64 mL/min/{1.73_m2}

## 2024-10-07 MED ORDER — LOSARTAN POTASSIUM 25 MG PO TABS: 25.0000 mg | ORAL_TABLET | Freq: Every day | ORAL | Status: AC

## 2024-10-07 NOTE — Addendum Note (Signed)
 Addended by: Nejla Reasor A on: 10/07/2024 02:17 PM   Modules accepted: Orders

## 2024-10-08 ENCOUNTER — Ambulatory Visit: Admitting: Behavioral Health

## 2024-10-09 ENCOUNTER — Telehealth (HOSPITAL_COMMUNITY): Payer: Self-pay

## 2024-10-09 ENCOUNTER — Encounter (HOSPITAL_COMMUNITY)

## 2024-10-09 NOTE — Telephone Encounter (Signed)
 Left message enquiring if pt wanted to extend for one more session or to go ahead and graduate from cardiac rehab as we had today listed as her graduation date.  Requested pt to return call, return phone number given.
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
## 2024-10-11 ENCOUNTER — Telehealth (HOSPITAL_COMMUNITY): Payer: Self-pay

## 2024-10-11 ENCOUNTER — Encounter (HOSPITAL_COMMUNITY): Payer: Self-pay

## 2024-10-11 NOTE — Progress Notes (Signed)
 Discharge Progress Report  Patient Details  Name: Becky Lawson MRN: [id number] Date of Birth: 07-14-1951 Referring Provider:   Flowsheet Row INTENSIVE CARDIAC REHAB ORIENT from 07/18/2024 in [facility]  Referring Provider Gordy Bergamo, MD     Number of Visits: 32  Reason for Discharge:  Patient reached a stable level of exercise. Patient independent in their exercise. Patient has met program and personal goals.  Smoking History:  Tobacco Use History[1]  Diagnosis:  No diagnosis found.  ADL [facility]:   Initial Exercise Prescription:   Discharge Exercise Prescription (Final Exercise Prescription Changes):  Exercise Prescription Changes - 09/27/24 0838       Response to Exercise   Blood Pressure (Admit) 140/70    Blood Pressure (Exit) 112/60    Heart Rate (Admit) 78 bpm    Heart Rate (Exercise) 82 bpm    Heart Rate (Exit) 69 bpm    Rating of Perceived Exertion (Exercise) 9    Symptoms None    Comments Pt's last day in the [facility] program    Duration Continue with 30 min of aerobic exercise without signs/symptoms of physical distress.    Intensity THRR unchanged      Progression   Progression Continue to progress workloads to maintain intensity without signs/symptoms of physical distress.    Average METs 2.1      Resistance Training   Weight 2 lbs    Reps 10-15    Time 5 Minutes      Interval Training   Interval Training No      NuStep   Level 2    Minutes 30    METs 2.1      Home Exercise Plan   Plans to continue exercise at [employer] (comment)    Frequency Add 3 additional days to program exercise sessions.    Initial Home Exercises Provided 09/11/24          Functional Capacity:   Psychological, QOL, Others - Outcomes: PHQ 2/9:    10/11/2024    2:29 PM 08/14/2024    3:12 PM 07/18/2024    3:26 PM 03/19/2024    2:40 PM 09/26/2023   11:31 AM  Depression screen PHQ 2/9  Decreased  Interest 2 0 0 2 1  Down, Depressed, Hopeless 0 0 0 2 2  PHQ - 2 Score 2 0 0 4 3  Altered sleeping 3 3 0 3 1  Tired, decreased energy 3 3 3 3 3   Change in appetite 1 3 2  0 2  Feeling bad or failure about yourself  0 0 1 0 1  Trouble concentrating 0 0 0 0 0  Moving slowly or fidgety/restless 0 0 0 0 0  Suicidal thoughts 0 0 0 0   PHQ-9 Score 9 9 6 10  10    Difficult doing work/chores Extremely dIfficult Very difficult Somewhat difficult Very difficult Very difficult     Data saved with a previous flowsheet row definition    Quality of Life:   Personal Goals: Goals established at orientation with interventions provided to work toward goal.    Personal Goals Discharge:  Goals and Risk Factor Review     Row Name 09/03/24 1005 10/01/24 1529           Core Components/Risk Factors/Patient Goals Review   Personal Goals Review Hypertension;Lipids;Stress Hypertension;Lipids;Stress      Review Becky Lawson is doing well with exercise at cardiac rehab. VSS. Becky Lawson has maintained her  MET levels over the last 30 days. Becky Lawson is doing well with exercise at cardiac rehab. VSS. Becky Lawson has maintained her MET levels over the last 30 days.      Expected Outcomes Becky Lawson will continue to particpate in cardiac rehab for exercise nutrition and lifestyle modifications. Becky Lawson will continue to particpate in cardiac rehab for exercise nutrition and lifestyle modifications.         Exercise Goals and Review:   Exercise Goals Re-Evaluation:  Exercise Goals Re-Evaluation     Row Name 08/23/24 1526 09/23/24 1606           Exercise Goal Re-Evaluation   Exercise Goals Review Increase Physical Activity;Increase Strength and Stamina;Able to understand and use rate of perceived exertion (RPE) scale;Knowledge and understanding of Target Heart Rate Range (THRR);Understanding of Exercise Prescription Increase Physical Activity;Increase Strength and Stamina;Able to understand and use rate of perceived exertion (RPE)  scale;Knowledge and understanding of Target Heart Rate Range (THRR);Understanding of Exercise Prescription      Comments Reviewed METs and goals. Pt is making progess on her goals of improved strength, stamina and endurance. Pt is going to the [employer] program 2xweek and doing walking and doind an aerobic exericse class. Reviewed METs and goals. Pt continue to voice progess on her goals of improved strength, stamina and endurance. Pt contune at the the [employer] program, but now attends 3xweek and doing walking and doind an aerobic exericse class.      Expected Outcomes Will continue to monitor the patient and progress workloads as tolerated. Will continue to monitor the patient and progress workloads as tolerated.         Nutrition & Weight - Outcomes:    Nutrition:   Nutrition Discharge:   Education Questionnaire Score:  Pt graduates from  Intensive/Traditional cardiac rehab program today with completion of 32 exercise and education sessions. Pt maintained good attendance and progressed nicely during their participation in rehab as evidenced by increased MET level.   Medication list reconciled. Repeat  PHQ score-9, but pt is currently on medication for her depression, is actively seeing a mental health professional, denies SI/HI and denies needing any additional mental health referral at this time.  Pt has made significant lifestyle changes and should be commended for her success.  Becky Lawson achieved her goals during cardiac rehab.   Pt plans to continue exercise at the [facility] through [employer], riding a recumbent bicycle 3x/week.  Goals reviewed with patient; copy given to patient.    [1]  Social History Tobacco Use  Smoking Status Former   Current packs/day: 0.00   Average packs/day: 1 pack/day for 15.0 years (15.0 ttl pk-yrs)   Types: Cigarettes   Start date: 06/29/1964   Quit date: 06/30/1979   Years since quitting: 45.3  Smokeless Tobacco Never

## 2024-10-11 NOTE — Telephone Encounter (Signed)
 Completed repeat PHQ-9, reconciled medications, and found out patient's plan to continue her exercise going forward in light of her stated desire to graduate cardiac rehab at this time.

## 2024-10-11 NOTE — Telephone Encounter (Signed)
 Per Aliene Aris, EP, pt left a message on cardiac rehab departmental voicemail stating she wanted to graduate from the program at this time.

## 2025-02-12 ENCOUNTER — Ambulatory Visit: Admitting: Family Medicine

## 2025-03-25 ENCOUNTER — Encounter
